# Patient Record
Sex: Male | Born: 1940 | Race: White | Hispanic: No | Marital: Married | Smoking: Former smoker
Health system: Southern US, Community
[De-identification: ages and names within clinical notes are randomized; demographics above are authoritative.]

## PROBLEM LIST (undated history)

## (undated) DIAGNOSIS — N200 Calculus of kidney: Secondary | ICD-10-CM

## (undated) DIAGNOSIS — J449 Chronic obstructive pulmonary disease, unspecified: Secondary | ICD-10-CM

## (undated) DIAGNOSIS — C801 Malignant (primary) neoplasm, unspecified: Secondary | ICD-10-CM

## (undated) DIAGNOSIS — C3490 Malignant neoplasm of unspecified part of unspecified bronchus or lung: Secondary | ICD-10-CM

## (undated) DIAGNOSIS — I10 Essential (primary) hypertension: Secondary | ICD-10-CM

## (undated) DIAGNOSIS — I2510 Atherosclerotic heart disease of native coronary artery without angina pectoris: Secondary | ICD-10-CM

## (undated) DIAGNOSIS — E041 Nontoxic single thyroid nodule: Secondary | ICD-10-CM

## (undated) DIAGNOSIS — C439 Malignant melanoma of skin, unspecified: Secondary | ICD-10-CM

## (undated) HISTORY — DX: Calculus of kidney: N20.0

## (undated) HISTORY — PX: CORONARY ANGIOPLASTY: SHX604

## (undated) HISTORY — DX: Nontoxic single thyroid nodule: E04.1

## (undated) HISTORY — PX: OTHER SURGICAL HISTORY: SHX169

## (undated) HISTORY — DX: Malignant neoplasm of unspecified part of unspecified bronchus or lung: C34.90

## (undated) HISTORY — DX: Malignant (primary) neoplasm, unspecified: C80.1

## (undated) HISTORY — DX: Chronic obstructive pulmonary disease, unspecified: J44.9

## (undated) HISTORY — DX: Malignant melanoma of skin, unspecified: C43.9

## (undated) SURGERY — Surgical Case
Anesthesia: *Unknown

---

## 2009-11-19 HISTORY — PX: OTHER SURGICAL HISTORY: SHX169

## 2010-08-24 ENCOUNTER — Ambulatory Visit: Payer: Self-pay | Admitting: Cardiology

## 2011-07-03 ENCOUNTER — Ambulatory Visit: Payer: Self-pay | Admitting: Unknown Physician Specialty

## 2011-07-03 DIAGNOSIS — I2510 Atherosclerotic heart disease of native coronary artery without angina pectoris: Secondary | ICD-10-CM

## 2011-07-09 ENCOUNTER — Ambulatory Visit: Payer: Self-pay | Admitting: Unknown Physician Specialty

## 2011-07-11 LAB — PATHOLOGY REPORT

## 2012-04-01 ENCOUNTER — Ambulatory Visit: Payer: Self-pay | Admitting: General Surgery

## 2012-04-01 HISTORY — PX: COLONOSCOPY: SHX174

## 2015-04-25 DIAGNOSIS — R0681 Apnea, not elsewhere classified: Secondary | ICD-10-CM | POA: Insufficient documentation

## 2015-08-02 ENCOUNTER — Encounter: Payer: Self-pay | Admitting: Urgent Care

## 2015-08-02 DIAGNOSIS — Y998 Other external cause status: Secondary | ICD-10-CM | POA: Insufficient documentation

## 2015-08-02 DIAGNOSIS — S3992XA Unspecified injury of lower back, initial encounter: Secondary | ICD-10-CM | POA: Diagnosis present

## 2015-08-02 DIAGNOSIS — I10 Essential (primary) hypertension: Secondary | ICD-10-CM | POA: Diagnosis not present

## 2015-08-02 DIAGNOSIS — X58XXXA Exposure to other specified factors, initial encounter: Secondary | ICD-10-CM | POA: Diagnosis not present

## 2015-08-02 DIAGNOSIS — Y9389 Activity, other specified: Secondary | ICD-10-CM | POA: Diagnosis not present

## 2015-08-02 DIAGNOSIS — Y9289 Other specified places as the place of occurrence of the external cause: Secondary | ICD-10-CM | POA: Diagnosis not present

## 2015-08-02 NOTE — ED Notes (Signed)
Patient presents with c.o LEFT lower back/flank pain that started after "twisting" wrong this am. Patient also picked up 40lb box. Denies urinary symptoms.

## 2015-08-03 ENCOUNTER — Emergency Department
Admission: EM | Admit: 2015-08-03 | Discharge: 2015-08-03 | Discharge status: Against Medical Advice | Payer: PPO | Attending: Emergency Medicine | Admitting: Emergency Medicine

## 2015-08-03 HISTORY — DX: Atherosclerotic heart disease of native coronary artery without angina pectoris: I25.10

## 2015-08-03 HISTORY — DX: Essential (primary) hypertension: I10

## 2015-12-26 DIAGNOSIS — L820 Inflamed seborrheic keratosis: Secondary | ICD-10-CM | POA: Diagnosis not present

## 2015-12-26 DIAGNOSIS — Z08 Encounter for follow-up examination after completed treatment for malignant neoplasm: Secondary | ICD-10-CM | POA: Diagnosis not present

## 2015-12-26 DIAGNOSIS — L918 Other hypertrophic disorders of the skin: Secondary | ICD-10-CM | POA: Diagnosis not present

## 2015-12-26 DIAGNOSIS — Z85828 Personal history of other malignant neoplasm of skin: Secondary | ICD-10-CM | POA: Diagnosis not present

## 2015-12-26 DIAGNOSIS — L570 Actinic keratosis: Secondary | ICD-10-CM | POA: Diagnosis not present

## 2015-12-26 DIAGNOSIS — L821 Other seborrheic keratosis: Secondary | ICD-10-CM | POA: Diagnosis not present

## 2015-12-26 DIAGNOSIS — D485 Neoplasm of uncertain behavior of skin: Secondary | ICD-10-CM | POA: Diagnosis not present

## 2015-12-26 DIAGNOSIS — C434 Malignant melanoma of scalp and neck: Secondary | ICD-10-CM | POA: Diagnosis not present

## 2015-12-26 DIAGNOSIS — Z1283 Encounter for screening for malignant neoplasm of skin: Secondary | ICD-10-CM | POA: Diagnosis not present

## 2015-12-26 DIAGNOSIS — Z85820 Personal history of malignant melanoma of skin: Secondary | ICD-10-CM | POA: Diagnosis not present

## 2016-01-04 DIAGNOSIS — C434 Malignant melanoma of scalp and neck: Secondary | ICD-10-CM | POA: Diagnosis not present

## 2016-01-11 ENCOUNTER — Ambulatory Visit
Admission: RE | Admit: 2016-01-11 | Discharge: 2016-01-11 | Disposition: A | Discharge status: Home / Self Care | Payer: PPO | Source: Ambulatory Visit | Attending: General Surgery | Admitting: General Surgery

## 2016-01-11 ENCOUNTER — Telehealth: Payer: Self-pay | Admitting: *Deleted

## 2016-01-11 ENCOUNTER — Other Ambulatory Visit: Payer: Self-pay | Admitting: *Deleted

## 2016-01-11 ENCOUNTER — Ambulatory Visit (INDEPENDENT_AMBULATORY_CARE_PROVIDER_SITE_OTHER): Payer: PPO | Admitting: General Surgery

## 2016-01-11 ENCOUNTER — Encounter: Payer: Self-pay | Admitting: General Surgery

## 2016-01-11 VITALS — BP 154/84 | HR 76 | Resp 16 | Ht 74.0 in | Wt 218.0 lb

## 2016-01-11 DIAGNOSIS — J9811 Atelectasis: Secondary | ICD-10-CM | POA: Insufficient documentation

## 2016-01-11 DIAGNOSIS — C439 Malignant melanoma of skin, unspecified: Secondary | ICD-10-CM

## 2016-01-11 DIAGNOSIS — J984 Other disorders of lung: Secondary | ICD-10-CM | POA: Insufficient documentation

## 2016-01-11 DIAGNOSIS — D0361 Melanoma in situ of right upper limb, including shoulder: Secondary | ICD-10-CM | POA: Diagnosis not present

## 2016-01-11 NOTE — Telephone Encounter (Signed)
-----   Message from Robert Bellow, MD sent at 01/11/2016  1:02 PM EST ----- CXR shows a spot likely from his nipple, but needs to be repeated with nipple markers. Thanks.  ----- Message -----    From: Rad Results In Interface    Sent: 01/11/2016  11:35 AM      To: Robert Bellow, MD

## 2016-01-11 NOTE — Progress Notes (Signed)
Patient ID: Glen Davis, male   DOB: 09-14-1941, 75 y.o.   MRN: [id number]  Chief Complaint  Patient presents with  . Other    Melanoma on upper back    HPI Glen Davis is a 75 y.o. male here today for a evaluation of a recently diagnosed melanoma on his right upper back . Patient states he noted several areas on the back and recent dermatology evaluation confirmed a melanoma on the right trapezius area. Several other nonmalignant areas consistent with seborrheic keratoses were treated with a quick nitrogen. No pain. He states he had a biopsy done about a month ago. He is accompanied by his wife, Glen Davis.  HPI  Past Medical History  Diagnosis Date  . Hypertension   . CAD (coronary artery disease)   . 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 Cardiac stents  2011    Angioplasty / Stenting Femoral  . Arm surgery Left     arm  . Colonoscopy  04/01/12    Family History  Problem Relation Age of Onset  . Hodgkin's lymphoma Daughter     Social History Social History  Substance Use Topics  . Smoking status: Never Smoker   . Smokeless tobacco: None  . Alcohol Use: 0.0 oz/week    0 Standard drinks or equivalent per week    Allergies  Allergen Reactions  . Lisinopril Swelling  . Other Itching    Nitroglycerin Patch.    Current Outpatient Prescriptions  Medication Sig Dispense Refill  . amLODipine (NORVASC) 5 MG tablet TAKE 1 TABLET (5 MG TOTAL) BY MOUTH ONCE DAILY.  1  . aspirin 81 MG tablet Take 81 mg by mouth daily.    Marland Kitchen atorvastatin (LIPITOR) 40 MG tablet TAKE 1 TABLET (40 MG TOTAL) BY MOUTH ONCE DAILY.  11  . clopidogrel (PLAVIX) 75 MG tablet     . hydrochlorothiazide (HYDRODIURIL) 12.5 MG tablet     . losartan (COZAAR) 50 MG tablet     . metoprolol succinate (TOPROL-XL) 50 MG 24 hr tablet     . tamsulosin (FLOMAX) 0.4 MG CAPS capsule      No current facility-administered medications for this visit.    Review of  Systems Review of Systems  Constitutional: Negative.   Respiratory: Negative.   Cardiovascular: Negative.     Blood pressure 154/84, pulse 76, resp. rate 16, height '6\' 2"'$  (1.88 m), weight 218 lb (98.884 kg).  Physical Exam Physical Exam  Constitutional: He is oriented to person, place, and time. He appears well-developed and well-nourished.  Eyes: Conjunctivae are normal.  Neck: Neck supple.    Cardiovascular: Normal rate, regular rhythm and normal heart sounds.   Pulmonary/Chest: Effort normal and breath sounds normal.  Lymphadenopathy:       Head (right side): No posterior auricular and no occipital adenopathy present.       Head (left side): No posterior auricular and no occipital adenopathy present.    He has no cervical adenopathy.    He has no axillary adenopathy.  Neurological: He is alert and oriented to person, place, and time.  Skin: Skin is warm and dry.  multiple areas of treated  on back. Right posterior shoulder 6 by 12 mm scar.    Data Reviewed Pathology reported an invasive malignant melanoma measuring at least 0.42 mm, positive deep margin and peripheral margin. PT 1 a. The area contained both malignant melanoma, basal cell carcinoma  and seborrheic keratosis. Ulceration was reported above the area of basal cell carcinoma but not the area involving melanoma.  PA and lateral chest x-ray suggested a possible left lower lobe nodule likely related to the nipple. Repeat films with nipple markers recommended and pending.  Liver function studies dated 07/04/2015 were normal. Potassium was 3.5.  Hemoglobin 12/29/2014: 16.6.  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    Plan    At present, the melanoma is stented in the usual 0.7 mm cutoff to recommend sentinel node biopsy, but as the deep margin is positive it seems reasonable to complete a sentinel node study at the time of formal wide excision. The risks associated with  node biopsy and the potential for wound healing issues on the back were discussed.  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    Patient to stop his Plavix for five days before surgery. It is okay for patient to continue 81 mg aspirin once daily.   Patient's surgery has been scheduled for 01-24-16 at [facility].  Schedule Melanoma on back.   This information has been scribed by Gaspar Cola CMA. PCP:  Tera Partridge 01/12/2016, 10:15 AM

## 2016-01-11 NOTE — Telephone Encounter (Signed)
Patient contacted and notified that he needs to have a repeat CXR.   Also, patient aware of arrival time the day of surgery.   He will contact the office if he has further questions.

## 2016-01-11 NOTE — Patient Instructions (Addendum)
Patient to stop his Plavix for five days before surgery. It is okay for patient to continue 81 mg aspirin once daily.   Patient's surgery has been scheduled for 01-24-16 at [facility].

## 2016-01-11 NOTE — Telephone Encounter (Signed)
Message for patient to call the office.

## 2016-01-11 NOTE — Telephone Encounter (Signed)
Patient called and doubled check his medications he was taking at home, he stated there was no change and what we have on file is current.

## 2016-01-12 ENCOUNTER — Ambulatory Visit
Admission: RE | Admit: 2016-01-12 | Discharge: 2016-01-12 | Disposition: A | Discharge status: Home / Self Care | Payer: PPO | Source: Ambulatory Visit | Attending: General Surgery | Admitting: General Surgery

## 2016-01-12 DIAGNOSIS — R918 Other nonspecific abnormal finding of lung field: Secondary | ICD-10-CM | POA: Diagnosis not present

## 2016-01-12 DIAGNOSIS — C439 Malignant melanoma of skin, unspecified: Secondary | ICD-10-CM | POA: Insufficient documentation

## 2016-01-12 DIAGNOSIS — D0361 Melanoma in situ of right upper limb, including shoulder: Secondary | ICD-10-CM | POA: Diagnosis not present

## 2016-01-12 NOTE — H&P (Signed)
HPI  Glen Davis is a 75 y.o. male here today for a evaluation of a recently diagnosed melanoma on his right upper back . Patient states he noted several areas on the back and recent dermatology evaluation confirmed a melanoma on the right trapezius area. Several other nonmalignant areas consistent with seborrheic keratoses were treated with a quick nitrogen. No pain. He states he had a biopsy done about a month ago. He is accompanied by his wife, [name].  HPI  Past Medical History   Diagnosis  Date   .  Hypertension    .  CAD (coronary artery disease)    .  Cancer [facility])      left arm    Past Surgical History   Procedure  Laterality  Date   .  Cardiac stents   2011     Angioplasty / Stenting Femoral   .  Arm surgery  Left      arm   .  Colonoscopy   04/01/12    Family History   Problem  Relation  Age of Onset   .  Hodgkin's lymphoma  Daughter     Social History  Social History   Substance Use Topics   .  Smoking status:  Never Smoker   .  Smokeless tobacco:  None   .  Alcohol Use:  0.0 oz/week     0 Standard drinks or equivalent per week    Allergies   Allergen  Reactions   .  Lisinopril  Swelling   .  Other  Itching     Nitroglycerin Patch.    Current Outpatient Prescriptions   Medication  Sig  Dispense  Refill   .  amLODipine (NORVASC) 5 MG tablet  TAKE 1 TABLET (5 MG TOTAL) BY MOUTH ONCE DAILY.   1   .  aspirin 81 MG tablet  Take 81 mg by mouth daily.     Marland Kitchen  atorvastatin (LIPITOR) 40 MG tablet  TAKE 1 TABLET (40 MG TOTAL) BY MOUTH ONCE DAILY.   11   .  clopidogrel (PLAVIX) 75 MG tablet      .  hydrochlorothiazide (HYDRODIURIL) 12.5 MG tablet      .  losartan (COZAAR) 50 MG tablet      .  metoprolol succinate (TOPROL-XL) 50 MG 24 hr tablet      .  tamsulosin (FLOMAX) 0.4 MG CAPS capsule       No current facility-administered medications for this visit.    Review of Systems  Review of Systems  Constitutional: Negative.  Respiratory: Negative.  Cardiovascular:  Negative.   Blood pressure 154/84, pulse 76, resp. rate 16, height '6\' 2"'$  (1.88 m), weight 218 lb (98.884 kg).  Physical Exam  Physical Exam  Constitutional: He is oriented to person, place, and time. He appears well-developed and well-nourished.  Eyes: Conjunctivae are normal.  Neck: Neck supple.    Cardiovascular: Normal rate, regular rhythm and normal heart sounds.  Pulmonary/Chest: Effort normal and breath sounds normal.  Lymphadenopathy:  Head (right side): No posterior auricular and no occipital adenopathy present.  Head (left side): No posterior auricular and no occipital adenopathy present.  He has no cervical adenopathy.  He has no axillary adenopathy.  Neurological: He is alert and oriented to person, place, and time.  Skin: Skin is warm and dry.  multiple areas of treated on back. Right posterior shoulder 6 by 12 mm scar.   Data Reviewed  Pathology reported an invasive malignant melanoma measuring  at least 0.42 mm, positive deep margin and peripheral margin. PT 1 a. The area contained both malignant melanoma, basal cell carcinoma and seborrheic keratosis. Ulceration was reported above the area of basal cell carcinoma but not the area involving melanoma.  PA and lateral chest x-ray suggested a possible left lower lobe nodule likely related to the nipple. Repeat films with nipple markers recommended and pending.  Liver function studies dated 07/04/2015 were normal. Potassium was 3.5.  Hemoglobin 12/29/2014: 16.6.  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   Plan   At present, the melanoma is stented in the usual 0.7 mm cutoff to recommend sentinel node biopsy, but as the deep margin is positive it seems reasonable to complete a sentinel node study at the time of formal wide excision.  The risks associated with node biopsy and the potential for wound healing issues on the back were discussed.  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   Patient to stop his Plavix for five days before surgery. It is okay for patient to continue 81 mg aspirin once daily.  Patient's surgery has been scheduled for 01-24-16 at [facility].  Schedule Melanoma on back.  This information has been scribed by Gaspar Cola CMA.  PCP: Tera Partridge  01/12/2016, 10:15 AM

## 2016-01-16 ENCOUNTER — Other Ambulatory Visit: Payer: Self-pay | Admitting: *Deleted

## 2016-01-16 ENCOUNTER — Telehealth: Payer: Self-pay | Admitting: *Deleted

## 2016-01-16 DIAGNOSIS — D0361 Melanoma in situ of right upper limb, including shoulder: Secondary | ICD-10-CM

## 2016-01-16 DIAGNOSIS — R9389 Abnormal findings on diagnostic imaging of other specified body structures: Secondary | ICD-10-CM

## 2016-01-16 NOTE — Telephone Encounter (Signed)
-----   Message from Robert Bellow, MD sent at 01/16/2016  1:07 PM EST ----- Please notify the patient that the small abnormality previously noted on his chest x-ray is not related to the nipple, and I would recommend that we get a CT scan of the chest without contrast. Please arrange if he is amenable. Thank you ----- Message -----    From: Rad Results In Interface    Sent: 01/12/2016   4:47 PM      To: Robert Bellow, MD

## 2016-01-16 NOTE — Telephone Encounter (Signed)
Patient is agreeable to CT chest.   This patient has been scheduled for a CT chest without contrast at [facility] for Monday, 01-23-16 at 4 pm (arrive 3:45 pm). Prep: none. Patient verbalizes understanding.

## 2016-01-18 ENCOUNTER — Encounter: Payer: Self-pay | Admitting: *Deleted

## 2016-01-18 ENCOUNTER — Other Ambulatory Visit: Payer: PPO

## 2016-01-18 NOTE — Pre-Procedure Instructions (Signed)
encounter Isaias Cowman, MD - 10/24/2015 4:00 PM EST Formatting of this note may be different from the original. Established Patient Visit   Chief Complaint: Chief Complaint  Patient presents with  . Follow-up  4 months-having trouble sleeping  Date of Service: 10/24/2015 Date of Birth: 09/27/1941 PCP: Dion Body, MD  History of Present Illness: Mr. Sentell is a 75 y.o.male patient who returns for  1. 80% stenosis ostium D1 by cardiac catheterization 08/24/2010 2. Essential hypertension 3. Hyperlipidemia  Patient reports doing well, denies chest pain shortness of breath. He has not experienced any palpitations or heart racing. He denies peripheral edema. The patient is active, plays golf. He exercises regularly, goes to gym 3 times a week. Echocardiogram 06/13/2015 revealed LV EF of 55%. ETT sestamibi study 06/13/2015 revealed LVEF 55%, with mild reversible inferior wall defect consistent with mild ischemia. Cardiac catheterization was deferred in the absence of chest pain.  The patient has essential hypertension, blood pressure well controlled on amlodipine, hydrochlorothiazide, losartan, and metoprolol succinate, which are well tolerated without apparent side effects. The patient follows a low-sodium, no added salt diet.  The patient has hyperlipidemia, LDL cholesterol is 25, triglycerides 147 on 07/04/2015, currently on atorvastatin, which is well tolerated without apparent side effects, followed by his primary care provider. The patient follows a low-cholesterol, low-fat diet.  Past Medical and Surgical History  Past Medical History Past Medical History  Diagnosis Date  . BPH (benign prostatic hypertrophy)  . Coronary artery disease 2011  stent to mid LAD and siagonal  . Dyspnea 04/25/2015  Echo August, 2015: EF greater than 55% with mild LVH, mildly dilated ascending aorta without evidence of aortic regurgitation. Normal right-sided pressures with a estimated RV systolic  pressure of 22, left atrial size 4.1 centimeters, mild mitral regurgitation  . History of echocardiogram  07/2010: [facility]) EF normal, mod LVH, bild biatral enlargement, mild MR, mild TR 2.5 m/sec  . History of myocardial perfusion scan  08/08/10: Stress nuclear: [facility]) inferior ischemia. 9.7 METS, had chest pain with exertion  . Hypertension  . Hypertriglyceridemia  Trig 242- 08/2013   Past Surgical History He has a past surgical history that includes ORIF left ankle (1980); Cardiac catheterization; Angioplasty / stenting femoral; and Colonoscopy (04/01/12).   Medications and Allergies  Current Medications  Current Outpatient Prescriptions  Medication Sig Dispense Refill  . amLODIPine (NORVASC) 5 MG tablet TAKE 1 TABLET BY MOUTH ONCE A DAY 90 tablet 1  . aspirin 81 MG EC tablet Take 81 mg by mouth daily.  Marland Kitchen atorvastatin (LIPITOR) 40 MG tablet Take 1 tablet (40 mg total) by mouth once daily. 30 tablet 11  . clopidogrel (PLAVIX) 75 mg tablet TAKE 1 TABLET (75 MG TOTAL) BY MOUTH ONCE DAILY. 90 tablet 1  . hydrochlorothiazide (HYDRODIURIL) 12.5 MG tablet TAKE 2 TABLETS BY MOUTH ONCE A DAY 180 tablet 1  . losartan (COZAAR) 50 MG tablet TAKE 1 TABLET BY MOUTH ONCE A DAY 90 tablet 1  . metoprolol succinate (TOPROL-XL) 50 MG XL tablet TAKE 1 TABLET (50 MG TOTAL) BY MOUTH ONCE DAILY. 90 tablet 1  . nitroGLYcerin (NITROSTAT) 0.4 MG SL tablet Place 1 tablet (0.4 mg total) under the tongue every 5 (five) minutes as needed. May take up to 3 doses. 25 tablet 2  . silver sulfADIAZINE (SILVADENE, SSD) 1 % cream Apply topically 2 (two) times daily. 50 g 0  . tamsulosin (FLOMAX) 0.4 mg capsule TAKE 1 CAPSULE BY MOUTH ONCE A DAY 30 capsule  5   No current facility-administered medications for this visit.   Allergies: Lisinopril and Other  Social and Family History  Social History reports that he quit smoking about 46 years ago. His smoking use included Cigarettes. He has a 13.00  pack-year smoking history. He has never used smokeless tobacco. He reports that he drinks alcohol. He reports that he does not use illicit drugs.  Family History Family History  Problem Relation Age of Onset  . Stroke Father 65  Deceased  . No Known Problems Mother  . No Known Problems Sister  . No Known Problems Brother  . No Known Problems Brother  . No Known Problems Brother  . No Known Problems Brother  . No Known Problems Sister   Review of Systems   Review of Systems: The patient denies chest pain, shortness of breath, orthopnea, paroxysmal nocturnal dyspnea, pedal edema, palpitations, heart racing, presyncope, syncope. Review of 12 Systems is negative except as described above.  Physical Examination   Vitals: Visit Vitals  . BP (P) 122/70  . Pulse (P) 70  . Ht (P) 184.2 cm (6' 0.5")  . Wt (!) (P) 100.9 kg (222 lb 6.4 oz)  . BMI (P) 29.75 kg/m2   Ht:(P) 184.2 cm (6' 0.5") Wt:(!) (P) 100.9 kg (222 lb 6.4 oz) QPY:PPJK surface area is 2.27 meters squared (pended). Body mass index is 29.75 kg/(m^2) (pended).  HEENT: Pupils equally reactive to light and accomodation  Neck: Supple without thyromegaly, carotid pulses 2+ Lungs: clear to auscultation bilaterally; no wheezes, rales, rhonchi Heart: Regular rate and rhythm. No gallops, murmurs or rub Abdomen: soft nontender, nondistended, with normal bowel sounds Extremities: no cyanosis, clubbing, or edema Peripheral Pulses: 2+ in all extremities, 2+ femoral pulses bilaterally  Assessment   75 y.o. male with  1. Coronary artery disease involving native coronary artery of native heart without angina pectoris  2. Essential hypertension  3. DOE (dyspnea on exertion)  4. Hypertriglyceridemia (LDL 147 - 07/04/15)   75 year old gentleman with known coronary disease, currently without chest pain, with mild disc inferior wall ischemia seen on ETT sestamibi study, not consistent with his known coronary anatomy, cardiac  catheterization deferred in the absence of chest pain. The patient has essential hypertension, blood pressure under excellent control on current BP medications.  Plan   1. Continue current medications 2. Counseled patient about low-sodium diet 3. DASH diet printed instructions given to the patient 4. Counseled patient about low-cholesterol diet 5. Continue atorvastatin for hyperlipidemia management  6. Low-fat and cholesterol diet printed instructions given to the patient 7. Return to clinic for follow-up in 6 months  No orders of the defined types were placed in this encounter.  Return in about 6 months (around 04/23/2016).  Isaias Cowman, MD    Plan of Treatment - as of this encounter Upcoming Encounters Upcoming Encounters  Date Type Specialty Care Team Description  01/30/2016 Appointment Internal Medicine Dion Body, MD  [place]  [facility], [place] [address]  [phone]  [phone] (Fax)    04/24/2016 Appointment Cardiology Isaias Cowman, MD  [place]  [facility]  [place], [place] [address]  [phone]  [phone] (Fax)    Visit Diagnoses - in this encounter Diagnosis  Coronary artery disease involving native coronary artery of native heart without angina pectoris - Primary  Essential hypertension  DOE (dyspnea on exertion)  Other dyspnea and respiratory abnormality   Hypertriglyceridemia (LDL 147 - 07/04/15)  Pure hyperglyceridemia  Document Information Primary Care Provider Dion Body (Jun. 05, 2012 - Present) [phone] (Work) [phone] (Fax)  [facility] [place], [place] [address] Document Coverage Dates Dec. 05, 2016 - Dec. 06, 2016 [place] [phone] (Work) [place], [place] [address] Encounter Providers Isaias Cowman (Attending) [phone] (Work) [phone] (Fax)   [place] [facility] [place], [place] [address]

## 2016-01-18 NOTE — Pre-Procedure Instructions (Signed)
   1. Negative ETT 2. Normal left ventricular function 3. Normal wall mode 4. Mild inferior wall ischemia   Result Narrative  Procedure: Exercise Myocardial Perfusion Imaging   ONE day procedure  Indication: Coronary artery disease involving native coronary artery of  native heart without angina pectoris - Plan: NM myocardial perfusion SPECT  multiple (stress and rest), ECG stress test only  DOE (dyspnea on exertion) - Plan: NM myocardial perfusion SPECT multiple  (stress and rest), ECG stress test only Ordering Physician:   Dr. Isaias Cowman   Clinical History: 75 y.o. year old male Vitals: Height: 71 in  Weight: 217 lb Cardiac risk factors include:    Hyperlipidemia, PCI, HTN and CAD    Procedure: The patient performed treadmill exercise using a Bruce protocol for 8:01  minutes. The exercise test was stopped due to SOB/fatigue.  Blood pressure  response was hypertensive. The patient developed symptoms other than  fatigue during the procedure; specific symptoms included WHEEZING. PULSE  OXIMETRY OF 92% AT PEAK EXERCISE.  Rest HR: 53bpm Rest BP: 130/32mHg Max HR: 130bpm Max BP: 188/1025mg Mets:     10.10 % MAX HR:   89%  Stress Test Administered by: DIOswald HillockCMA  ECG Interpretation: Rest ECG:  normal sinus rhythm, none Stress ECG:  normal sinus rhythm,  Recovery ECG:  normal sinus rhythm ECG Interpretation:  negative, no ECG changes.   Administrations This Visit    technetium [id number] (CARDIOLITE) injection 12.28.63llicurie    Admin Date Action Dose Route Administered By      06/13/2015 Given by Other 12.81.77llicurie Intravenous KelKingsley CallanderCNMT            technetium [id number] (CARDIOLITE) injection 33.311.65licurie    Admin Date Action Dose Route Administered By      06/13/2015 Given by Other 33.379.03licurie Intravenous KellKingsley CallanderNMT              Gated post-stress perfusion imaging was performed  30 minutes after stress.  Rest images were performed 30 minutes after injection.  Gated LV Analysis:  TID:    LVEF= 55%  FINDINGS: Regional wall motion:  reveals normal myocardial thickening and wall  motion. The overall quality of the study is good.   Artifacts noted: no Left ventricular cavity: normal.  Perfusion Analysis:  SPECT images demonstrate small perfusion abnormality  of mild intensity is present in the inferior region on the stress images.     Status Results Details   Encounter Summary

## 2016-01-18 NOTE — Pre-Procedure Instructions (Signed)
Value Range  LV Ejection Fraction (%) 55   Aortic Valve Stenosis Grade none   Aortic Valve Regurgitation Grade mild   Aortic Valve Max Velocity (m/s) 1.6 m/sec   Mitral Valve Stenosis Grade none   Mitral Valve Regurgitation Grade mild   Tricuspid Valve Regurgitation Grade mild   Tricuspid Valve Regurgitation Max Velocity (m/s) 2.7 m/sec   Right Ventricle Systolic Pressure (mmHg) 03.0 mmHg   LV End Diastolic Diameter (cm) 5.5 cm  LV End Systolic Diameter (cm) 3.6 cm  LV Septum Wall Thickness (cm) 1.3 cm  LV Posterior Wall Thickness (cm) 1.2 cm  Left Atrium Diameter (cm) 4.3 cm   Result Narrative                     CARDIOLOGY DEPARTMENT                         [place], [place] #: [id number]         [address] [place], [place] [address]             Date: 06/13/2015 08:15 AM                                                                  Adult Male Age: 60 yrs                    ECHOCARDIOGRAM REPORT                         Outpatient           STUDY:CHEST WALL                       TAPE:           [id number]::[id number]            ECHO:Yes   DOPPLER:Yes                FILE:           MD1:           COLOR:Yes  CONTRAST:Yes     MACHINE:Philips       RV BIOPSY:No         3D:No   SOUND QLTY:Moderate          MEDIUM:None ___________________________________________________________________________________________           HISTORY:Chest pain           REASON:Assess, LV function       INDICATION:R06.09 (ICD-10-CM) - DOE (dyspnea on exertion) ___________________________________________________________________________________________  ECHOCARDIOGRAPHIC MEASUREMENTS 2D DIMENSIONS AORTA  Values      Normal Range      MAIN PA          Values      Normal Range           Annulus:  2.0 cm    [2.3 - 2.9]                PA Main:  nm*       [1.5 - 2.1]    Aorta Sin:  nm*       [3.1 - 3.7]       RIGHT VENTRICLE       ST Junction:  nm*       [2.6 - 3.2]                RV Base:  nm*       [ < 4.2]         Asc.Aorta:  nm*       [2.6 - 3.4]                 RV Mid:  nm*       [ < 3.5]  LEFT VENTRICLE                                        RV Length:  nm*       [ < 8.6]             LVIDd:  5.5 cm    [4.2 - 5.9]       INFERIOR VENA CAVA             LVIDs:  3.6 cm                              Max. IVC:  nm*       [ <= 2.1]                FS:  34.9 %    [> 25]                    Min. IVC:  nm*               SWT:  1.3 cm    [0.6 - 1.0]                   ------------------               PWT:  1.2 cm    [0.6 - 1.0]                   nm* - not measured  LEFT ATRIUM           LA Diam:  4.3 cm    [3.0 - 4.0]       LA A4C Area:  nm*       [ < 20]         LA Volume:  nm*       [18 - 58] ___________________________________________________________________________________________  ECHOCARDIOGRAPHIC DESCRIPTIONS  AORTIC ROOT         Size:Normal   Dissection:INDETERM FOR DISSECTION  AORTIC VALVE     Leaflets:Tricuspid         Morphology:MILDLY THICKENED     Mobility:Fully mobile  LEFT VENTRICLE  Size:Normal              Anterior:Normal  Contraction:Normal               Lateral:Normal   Closest EF:>55% (Estimated)      Septal:Normal    LV Masses:No Masses             Apical:Normal          ZMO:QHUT LVH            Inferior:Normal                                 Posterior:Normal Dias.FxClass:N/A  MITRAL VALVE     Leaflets:Normal              Mobility:Fully mobile   Morphology:Normal  LEFT ATRIUM         Size:MILDLY ENLARGED    LA Masses:No masses  MAIN PA         Size:Normal  PULMONIC VALVE     Leaflets:N/A               Morphology:Normal     Mobility:Fully mobile  RIGHT VENTRICLE    RV Masses:No Masses               Size:Normal    Free Wall:Normal           Contraction:Normal  TRICUSPID VALVE     Leaflets:Normal               Mobility:Fully mobile   Morphology:Normal  RIGHT ATRIUM         Size:Normal              RA Other:None      RA Mass:No masses  PERICARDIUM        Fluid:No effusion  INFERIOR VENACAVA         Size:Normal Normal respiratory collapse  ____________________________________________________________________  DOPPLER ECHO and OTHER SPECIAL PROCEDURES    Aortic:MILD AR                       No AS           162.0 cm/sec peak vel         10.5 mmHg peak grad     Mitral:MILD MR                       No MS                                         2.8 cm^2 by DOPPLER           MV Inflow E Vel=79.6 cm/sec   MV Annulus E'Vel=6.3 cm/sec           E/E'Ratio=12.6  Tricuspid:MILD TR                       No TS           268.5 cm/sec peak TR vel      38.8 mmHg peak RV pressure  Pulmonary:MILD PR                       No PS           73.5 cm/sec peak vel  2.2 mmHg peak grad    ___________________________________________________________________________________________ INTERPRETATION NORMAL LEFT VENTRICULAR SYSTOLIC FUNCTION   WITH MILD LVH MILD VALVULAR REGURGITATION (See above) NO VALVULAR STENOSIS MILD PHTN   ___________________________________________________________________________________________ Electronically signed by: MD Miquel Dunn on 06/15/2015 04:42 PM             Performed By: Maurilio Lovely, [place]       Ordering Physician: Isaias Cowman  ___________________________________________________________________________________________   Status Results Details   Encounter Summary  2015 September

## 2016-01-18 NOTE — Patient Instructions (Addendum)
  Your procedure is scheduled on: 01-24-16 (TUESDAY) Report to [place] @ 9:45 (2ND DESK ON RIGHT)   Remember: Instructions that are not followed completely may result in serious medical risk, up to and including death, or upon the discretion of your surgeon and anesthesiologist your surgery may need to be rescheduled.    _X___ 1. Do not eat food or drink liquids after midnight. No gum chewing or hard candies.     _X___ 2. No Alcohol for 24 hours before or after surgery.   ____ 3. Bring all medications with you on the day of surgery if instructed.    _X___ 4. Notify your doctor if there is any change in your medical condition     (cold, fever, infections).     Do not wear jewelry, make-up, hairpins, clips or nail polish.  Do not wear lotions, powders, or perfumes. You may wear deodorant.  Do not shave 48 hours prior to surgery. Men may shave face and neck.  Do not bring valuables to the hospital.    [facility] is not responsible for any belongings or valuables.               Contacts, dentures or bridgework may not be worn into surgery.  Leave your suitcase in the car. After surgery it may be brought to your room.  For patients admitted to the hospital, discharge time is determined by your treatment team.   Patients discharged the day of surgery will not be allowed to drive home.   Please read over the following fact sheets that you were given:    _X___ Take these medicines the morning of surgery with A SIP OF WATER:    1. AMLODIPINE  2. ATORVASTATIN  3. LOSARTAN   4. METOPROLOL  5. FLOMAX  6.  ____ Fleet Enema (as directed)   _X___ Use CHG Soap as directed  ____ Use inhalers on the day of surgery  ____ Stop metformin 2 days prior to surgery    ____ Take 1/2 of usual insulin dose the night before surgery and none on the morning of surgery.   _X___ Stop Coumadin/Plavix/aspirin-PT STOPPED PLAVIX 01-17-16-H&P INSTRUCTED PT TO STOP 5 DAYS PRIOR TO  SURGERY-OK TO CONTINUE 81 MG ASPIRIN-DO NOT TAKE DAY OF SURGERY  ____ Stop Anti-inflammatories   ____ Stop supplements until after surgery.    ____ Bring C-Pap to the hospital.

## 2016-01-19 ENCOUNTER — Encounter
Admission: RE | Admit: 2016-01-19 | Discharge: 2016-01-19 | Disposition: A | Discharge status: Home / Self Care | Payer: PPO | Source: Ambulatory Visit | Attending: General Surgery | Admitting: General Surgery

## 2016-01-19 DIAGNOSIS — Z01812 Encounter for preprocedural laboratory examination: Secondary | ICD-10-CM | POA: Insufficient documentation

## 2016-01-19 LAB — BASIC METABOLIC PANEL
Anion gap: 9 (ref 5–15)
BUN: 13 mg/dL (ref 6–20)
CO2: 27 mmol/L (ref 22–32)
CREATININE: 0.86 mg/dL (ref 0.61–1.24)
Calcium: 9.4 mg/dL (ref 8.9–10.3)
Chloride: 102 mmol/L (ref 101–111)
Glucose, Bld: 105 mg/dL — ABNORMAL HIGH (ref 65–99)
POTASSIUM: 3.2 mmol/L — AB (ref 3.5–5.1)
SODIUM: 138 mmol/L (ref 135–145)

## 2016-01-19 MED ORDER — HEPARIN SOD (PORK) LOCK FLUSH 100 UNIT/ML IV SOLN
INTRAVENOUS | Status: AC
  Filled 2016-01-19: qty 5

## 2016-01-20 ENCOUNTER — Telehealth: Payer: Self-pay | Admitting: General Surgery

## 2016-01-20 ENCOUNTER — Ambulatory Visit: Payer: PPO

## 2016-01-20 NOTE — Telephone Encounter (Signed)
The patient was notified that his potassium is slightly low at 3.2, likely secondary to diuretic effect. A prescription for KCl, 20 mEq tablets, #10 was called to his pharmacy. He'll pick it up in the morning and begin using the tablets twice a day.

## 2016-01-20 NOTE — Pre-Procedure Instructions (Signed)
FAXED ABNORMAL K+ 3.2 TO DR BYRNETTS OFFICE-FAX CONFIRMATION RECEIVED-OFFICE CLOSES ON FRIDAYS @ 1PM SO I WILL CALL ON Monday TO MAKE SURE OFFICE RECEIVED FAX

## 2016-01-23 ENCOUNTER — Ambulatory Visit
Admission: RE | Admit: 2016-01-23 | Discharge: 2016-01-23 | Disposition: A | Discharge status: Home / Self Care | Payer: PPO | Source: Ambulatory Visit | Attending: General Surgery | Admitting: General Surgery

## 2016-01-23 DIAGNOSIS — I2510 Atherosclerotic heart disease of native coronary artery without angina pectoris: Secondary | ICD-10-CM | POA: Insufficient documentation

## 2016-01-23 DIAGNOSIS — J432 Centrilobular emphysema: Secondary | ICD-10-CM | POA: Diagnosis not present

## 2016-01-23 DIAGNOSIS — R9389 Abnormal findings on diagnostic imaging of other specified body structures: Secondary | ICD-10-CM

## 2016-01-23 DIAGNOSIS — R938 Abnormal findings on diagnostic imaging of other specified body structures: Secondary | ICD-10-CM | POA: Diagnosis not present

## 2016-01-23 DIAGNOSIS — D0361 Melanoma in situ of right upper limb, including shoulder: Secondary | ICD-10-CM | POA: Diagnosis not present

## 2016-01-23 DIAGNOSIS — R918 Other nonspecific abnormal finding of lung field: Secondary | ICD-10-CM | POA: Diagnosis not present

## 2016-01-23 DIAGNOSIS — Z9889 Other specified postprocedural states: Secondary | ICD-10-CM | POA: Insufficient documentation

## 2016-01-24 ENCOUNTER — Encounter: Payer: Self-pay | Admitting: General Surgery

## 2016-01-24 ENCOUNTER — Encounter
Admission: RE | Admit: 2016-01-24 | Discharge: 2016-01-24 | Disposition: A | Discharge status: Home / Self Care | Payer: PPO | Source: Ambulatory Visit | Attending: General Surgery | Admitting: General Surgery

## 2016-01-24 ENCOUNTER — Encounter
Admission: RE | Disposition: A | Discharge status: Home / Self Care | Payer: Self-pay | Source: Ambulatory Visit | Attending: General Surgery

## 2016-01-24 ENCOUNTER — Ambulatory Visit
Admission: RE | Admit: 2016-01-24 | Discharge: 2016-01-24 | Disposition: A | Discharge status: Home / Self Care | Payer: PPO | Source: Ambulatory Visit | Attending: General Surgery | Admitting: General Surgery

## 2016-01-24 ENCOUNTER — Ambulatory Visit: Payer: PPO | Admitting: Anesthesiology

## 2016-01-24 DIAGNOSIS — Z807 Family history of other malignant neoplasms of lymphoid, hematopoietic and related tissues: Secondary | ICD-10-CM | POA: Insufficient documentation

## 2016-01-24 DIAGNOSIS — I2510 Atherosclerotic heart disease of native coronary artery without angina pectoris: Secondary | ICD-10-CM | POA: Diagnosis not present

## 2016-01-24 DIAGNOSIS — C44622 Squamous cell carcinoma of skin of right upper limb, including shoulder: Secondary | ICD-10-CM

## 2016-01-24 DIAGNOSIS — Z888 Allergy status to other drugs, medicaments and biological substances status: Secondary | ICD-10-CM | POA: Diagnosis not present

## 2016-01-24 DIAGNOSIS — C4361 Malignant melanoma of right upper limb, including shoulder: Secondary | ICD-10-CM | POA: Diagnosis not present

## 2016-01-24 DIAGNOSIS — Z87891 Personal history of nicotine dependence: Secondary | ICD-10-CM | POA: Diagnosis not present

## 2016-01-24 DIAGNOSIS — D0361 Melanoma in situ of right upper limb, including shoulder: Secondary | ICD-10-CM

## 2016-01-24 DIAGNOSIS — Z79899 Other long term (current) drug therapy: Secondary | ICD-10-CM | POA: Diagnosis not present

## 2016-01-24 DIAGNOSIS — C439 Malignant melanoma of skin, unspecified: Secondary | ICD-10-CM

## 2016-01-24 DIAGNOSIS — Z955 Presence of coronary angioplasty implant and graft: Secondary | ICD-10-CM | POA: Insufficient documentation

## 2016-01-24 DIAGNOSIS — Z7982 Long term (current) use of aspirin: Secondary | ICD-10-CM | POA: Diagnosis not present

## 2016-01-24 DIAGNOSIS — I10 Essential (primary) hypertension: Secondary | ICD-10-CM | POA: Diagnosis not present

## 2016-01-24 HISTORY — DX: Malignant melanoma of skin, unspecified: C43.9

## 2016-01-24 HISTORY — PX: EXCISION MELANOMA WITH SENTINEL LYMPH NODE BIOPSY: SHX5628

## 2016-01-24 LAB — POCT I-STAT 4, (NA,K, GLUC, HGB,HCT)
Glucose, Bld: 106 mg/dL — ABNORMAL HIGH (ref 65–99)
HEMATOCRIT: 47 % (ref 39.0–52.0)
HEMOGLOBIN: 16 g/dL (ref 13.0–17.0)
Potassium: 3.8 mmol/L (ref 3.5–5.1)
Sodium: 141 mmol/L (ref 135–145)

## 2016-01-24 SURGERY — EXCISION, MELANOMA, WITH SENTINEL LYMPH NODE BIOPSY
Anesthesia: General | Site: Shoulder | Laterality: Right | Wound class: Clean

## 2016-01-24 MED ORDER — OXYCODONE HCL 5 MG PO TABS
ORAL_TABLET | ORAL | Status: AC
  Filled 2016-01-24: qty 1

## 2016-01-24 MED ORDER — FAMOTIDINE 20 MG PO TABS
20.0000 mg | ORAL_TABLET | Freq: Once | ORAL | Status: AC
  Administered 2016-01-24: 20 mg via ORAL

## 2016-01-24 MED ORDER — BUPIVACAINE HCL (PF) 0.5 % IJ SOLN
INTRAMUSCULAR | Status: AC
  Filled 2016-01-24: qty 30

## 2016-01-24 MED ORDER — ONDANSETRON HCL 4 MG/2ML IJ SOLN
INTRAMUSCULAR | Status: DC | PRN
  Administered 2016-01-24: 4 mg via INTRAVENOUS

## 2016-01-24 MED ORDER — FENTANYL CITRATE (PF) 100 MCG/2ML IJ SOLN
25.0000 ug | INTRAMUSCULAR | Status: DC | PRN
Start: 2016-01-24 — End: 2016-01-24

## 2016-01-24 MED ORDER — IPRATROPIUM-ALBUTEROL 0.5-2.5 (3) MG/3ML IN SOLN: 3.0000 mL | Freq: Once | RESPIRATORY_TRACT | Status: DC | PRN

## 2016-01-24 MED ORDER — FENTANYL CITRATE (PF) 100 MCG/2ML IJ SOLN
INTRAMUSCULAR | Status: DC | PRN
  Administered 2016-01-24: 50 ug via INTRAVENOUS

## 2016-01-24 MED ORDER — ACETAMINOPHEN 10 MG/ML IV SOLN
INTRAVENOUS | Status: DC | PRN
  Administered 2016-01-24: 1000 mg via INTRAVENOUS

## 2016-01-24 MED ORDER — MIDAZOLAM HCL 2 MG/2ML IJ SOLN
INTRAMUSCULAR | Status: DC | PRN
  Administered 2016-01-24: 1 mg via INTRAVENOUS

## 2016-01-24 MED ORDER — KETOROLAC TROMETHAMINE 30 MG/ML IJ SOLN
INTRAMUSCULAR | Status: DC | PRN
  Administered 2016-01-24: 30 mg via INTRAVENOUS

## 2016-01-24 MED ORDER — HYDROCODONE-ACETAMINOPHEN 5-325 MG PO TABS: 1.0000 | ORAL_TABLET | ORAL | Status: DC | PRN

## 2016-01-24 MED ORDER — OXYCODONE HCL 5 MG PO TABS: 5.0000 mg | ORAL_TABLET | Freq: Once | ORAL | Status: DC | PRN

## 2016-01-24 MED ORDER — OXYCODONE HCL 5 MG/5ML PO SOLN: 5.0000 mg | Freq: Once | ORAL | Status: DC | PRN

## 2016-01-24 MED ORDER — METHYLENE BLUE 0.5 % INJ SOLN
INTRAVENOUS | Status: AC
Start: 2016-01-24 — End: 2016-01-24
  Filled 2016-01-24: qty 10

## 2016-01-24 MED ORDER — BUPIVACAINE HCL (PF) 0.5 % IJ SOLN
INTRAMUSCULAR | Status: DC | PRN
  Administered 2016-01-24: 30 mL

## 2016-01-24 MED ORDER — GLYCOPYRROLATE 0.2 MG/ML IJ SOLN
INTRAMUSCULAR | Status: DC | PRN
  Administered 2016-01-24: 0.6 mg via INTRAVENOUS

## 2016-01-24 MED ORDER — PHENYLEPHRINE HCL 10 MG/ML IJ SOLN
INTRAMUSCULAR | Status: DC | PRN
  Administered 2016-01-24: 100 ug via INTRAVENOUS

## 2016-01-24 MED ORDER — LACTATED RINGERS IV SOLN
INTRAVENOUS | Status: DC
  Administered 2016-01-24 (×3): via INTRAVENOUS

## 2016-01-24 MED ORDER — PROPOFOL 10 MG/ML IV BOLUS
INTRAVENOUS | Status: DC | PRN
  Administered 2016-01-24 (×2): 150 mg via INTRAVENOUS

## 2016-01-24 MED ORDER — ACETAMINOPHEN 10 MG/ML IV SOLN
INTRAVENOUS | Status: AC
  Filled 2016-01-24: qty 100

## 2016-01-24 MED ORDER — TECHNETIUM TC 99M SULFUR COLLOID FILTERED
500.0000 | Freq: Once | INTRAVENOUS | Status: AC | PRN
  Administered 2016-01-24: 523 via INTRADERMAL

## 2016-01-24 MED ORDER — LIDOCAINE HCL (CARDIAC) 20 MG/ML IV SOLN
INTRAVENOUS | Status: DC | PRN
  Administered 2016-01-24: 30 mg via INTRAVENOUS

## 2016-01-24 MED ORDER — FAMOTIDINE 20 MG PO TABS
ORAL_TABLET | ORAL | Status: AC
  Administered 2016-01-24: 20 mg via ORAL
  Filled 2016-01-24: qty 1

## 2016-01-24 MED ORDER — ROCURONIUM BROMIDE 100 MG/10ML IV SOLN
INTRAVENOUS | Status: DC | PRN
  Administered 2016-01-24: 40 mg via INTRAVENOUS

## 2016-01-24 MED ORDER — NEOSTIGMINE METHYLSULFATE 10 MG/10ML IV SOLN
INTRAVENOUS | Status: DC | PRN
Start: 2016-01-24 — End: 2016-01-24
  Administered 2016-01-24: 3 mg via INTRAVENOUS

## 2016-01-24 MED ORDER — EPHEDRINE SULFATE 50 MG/ML IJ SOLN
INTRAMUSCULAR | Status: DC | PRN
  Administered 2016-01-24: 10 mg via INTRAVENOUS

## 2016-01-24 SURGICAL SUPPLY — 33 items
CANISTER SUCT 1200ML W/VALVE (MISCELLANEOUS) ×4 IMPLANT
CHLORAPREP W/TINT 26ML (MISCELLANEOUS) ×4 IMPLANT
CLOSURE WOUND 1/2 X4 (GAUZE/BANDAGES/DRESSINGS) ×1
COVER PROBE FLX POLY STRL (MISCELLANEOUS) IMPLANT
DRAPE LAPAROTOMY 100X77 ABD (DRAPES) IMPLANT
DRESSING TELFA 4X3 1S ST N-ADH (GAUZE/BANDAGES/DRESSINGS) ×4 IMPLANT
DRSG TEGADERM 4X4.75 (GAUZE/BANDAGES/DRESSINGS) ×4 IMPLANT
ELECT REM PT RETURN 9FT ADLT (ELECTROSURGICAL) ×4
ELECTRODE REM PT RTRN 9FT ADLT (ELECTROSURGICAL) ×2 IMPLANT
GAUZE SPONGE 4X4 12PLY STRL (GAUZE/BANDAGES/DRESSINGS) IMPLANT
GLOVE BIO SURGEON STRL SZ7.5 (GLOVE) ×4 IMPLANT
GLOVE INDICATOR 8.0 STRL GRN (GLOVE) ×4 IMPLANT
GOWN STRL REUS W/ TWL LRG LVL3 (GOWN DISPOSABLE) ×4 IMPLANT
GOWN STRL REUS W/TWL LRG LVL3 (GOWN DISPOSABLE) ×4
KIT RM TURNOVER STRD PROC AR (KITS) ×4 IMPLANT
LABEL OR SOLS (LABEL) ×4 IMPLANT
NDL SAFETY 18GX1.5 (NEEDLE) ×4 IMPLANT
NDL SAFETY 22GX1.5 (NEEDLE) ×4 IMPLANT
PACK BASIN MINOR ARMC (MISCELLANEOUS) ×4 IMPLANT
SLEVE PROBE SENORX GAMMA FIND (MISCELLANEOUS) IMPLANT
STRIP CLOSURE SKIN 1/2X4 (GAUZE/BANDAGES/DRESSINGS) ×3 IMPLANT
SUT ETHILON 3-0 (SUTURE) ×4 IMPLANT
SUT ETHILON 4-0 (SUTURE) ×2
SUT ETHILON 4-0 FS2 18XMFL BLK (SUTURE) ×2
SUT VIC AB 2-0 CT1 (SUTURE) ×4 IMPLANT
SUT VIC AB 2-0 CT2 27 (SUTURE) ×8 IMPLANT
SUT VIC AB 3-0 SH 27 (SUTURE) ×2
SUT VIC AB 3-0 SH 27X BRD (SUTURE) ×2 IMPLANT
SUT VIC AB 4-0 FS2 27 (SUTURE) ×4 IMPLANT
SUT VICRYL+ 3-0 144IN (SUTURE) ×4 IMPLANT
SUTURE ETHLN 4-0 FS2 18XMF BLK (SUTURE) ×2 IMPLANT
SWABSTK COMLB BENZOIN TINCTURE (MISCELLANEOUS) ×4 IMPLANT
SYRINGE 10CC LL (SYRINGE) ×4 IMPLANT

## 2016-01-24 NOTE — H&P (Signed)
Procedure reviewed with the patient. Site for excision marked. Sentinel node imaging results pending.

## 2016-01-24 NOTE — Anesthesia Postprocedure Evaluation (Signed)
Anesthesia Post Note  Patient: JAVONTAE MARLETTE  Procedure(s) Performed: Procedure(s) (LRB): EXCISION MELANOMA Right Shoulder (Right)  Patient location during evaluation: PACU Anesthesia Type: General Level of consciousness: awake and alert Pain management: pain level controlled Vital Signs Assessment: post-procedure vital signs reviewed and stable Respiratory status: spontaneous breathing, nonlabored ventilation, respiratory function stable and patient connected to nasal cannula oxygen Cardiovascular status: blood pressure returned to baseline and stable Postop Assessment: no signs of nausea or vomiting Anesthetic complications: no    Last Vitals:  Filed Vitals:   01/24/16 1445 01/24/16 1509  BP:  128/76  Pulse:  46  Temp: 36.3 C 36.4 C  Resp:  14    Last Pain: There were no vitals filed for this visit.               Precious Haws Sayra Frisby

## 2016-01-24 NOTE — Progress Notes (Signed)
I spoke with Dr. Phillip Heal from dermatology. 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 This is on the same area as his recently diagnosed melanoma.  The patient is scheduled for wide excision and sentinel node biopsy of the melanoma on the right posterior shoulder later today.  The case has been reviewed informally with medical oncology. We'll arrange for a PET/CT after he recovers from surgery and likely biopsy of the largest left lung lesion if the PET scan is positive.

## 2016-01-24 NOTE — Anesthesia Procedure Notes (Signed)
Procedure Name: Intubation Date/Time: 01/24/2016 1:24 PM Performed by: Courtney Paris Pre-anesthesia Checklist: Patient identified, Timeout performed, Emergency Drugs available, Suction available and Patient being monitored Patient Re-evaluated:Patient Re-evaluated prior to inductionOxygen Delivery Method: Circle system utilized Preoxygenation: Pre-oxygenation with 100% oxygen Intubation Type: IV induction and Combination inhalational/ intravenous induction Ventilation: Mask ventilation without difficulty Laryngoscope Size: Miller and 3 Grade View: Grade II Tube size: 7.5 mm Number of attempts: 1 Airway Equipment and Method: Stylet Placement Confirmation: ETT inserted through vocal cords under direct vision,  positive ETCO2,  CO2 detector and breath sounds checked- equal and bilateral Secured at: 21 cm Tube secured with: Tape Dental Injury: Teeth and Oropharynx as per pre-operative assessment

## 2016-01-24 NOTE — OR Nursing (Signed)
Anesthesia notified of 50 heart rate. No new orders.

## 2016-01-24 NOTE — Anesthesia Preprocedure Evaluation (Signed)
Anesthesia Evaluation  Patient identified by MRN, date of birth, ID band Patient awake    Reviewed: Allergy & Precautions, H&P , NPO status , Patient's Chart, lab work & pertinent test results  History of Anesthesia Complications Negative for: history of anesthetic complications  Airway Mallampati: III  TM Distance: >3 FB Neck ROM: full    Dental  (+) Poor Dentition, Chipped, Missing   Pulmonary neg shortness of breath, former smoker,    Pulmonary exam normal breath sounds clear to auscultation       Cardiovascular Exercise Tolerance: Good hypertension, (-) angina+ CAD and + Cardiac Stents  (-) DOE Normal cardiovascular exam Rhythm:regular Rate:Normal     Neuro/Psych negative neurological ROS  negative psych ROS   GI/Hepatic negative GI ROS, Neg liver ROS,   Endo/Other  negative endocrine ROS  Renal/GU negative Renal ROS  negative genitourinary   Musculoskeletal   Abdominal   Peds  Hematology negative hematology ROS (+)   Anesthesia Other Findings Past Medical History:   Hypertension                                                 CAD (coronary artery disease)                                Cancer (HCC)                                                   Comment:left arm  Past Surgical History:   cardiac stents                                   2011           Comment:Angioplasty / Stenting Femoral-X2   arm surgery                                     Left                Comment:arm   COLONOSCOPY                                      04/01/12     BMI    Body Mass Index   27.97 kg/m 2      Reproductive/Obstetrics negative OB ROS                             Anesthesia Physical Anesthesia Plan  ASA: III  Anesthesia Plan: General ETT   Post-op Pain Management:    Induction:   Airway Management Planned:   Additional Equipment:   Intra-op Plan:   Post-operative Plan:    Informed Consent: I have reviewed the patients History and Physical, chart, labs and discussed the procedure including the risks, benefits and alternatives for the proposed anesthesia with the patient or authorized representative who has indicated his/her understanding and acceptance.  Dental Advisory Given  Plan Discussed with: Anesthesiologist, CRNA and Surgeon  Anesthesia Plan Comments:         Anesthesia Quick Evaluation

## 2016-01-24 NOTE — Transfer of Care (Signed)
2 Immediate Anesthesia Transfer of Care Note  Patient: Glen Davis  Procedure(s) Performed: Procedure(s): EXCISION MELANOMA Right Shoulder (Right)  Patient Location: PACU  Anesthesia Type:General  Level of Consciousness: awake, oriented and patient cooperative  Airway & Oxygen Therapy: Patient Spontanous Breathing and Patient connected to face mask oxygen  Post-op Assessment: Report given to RN and Post -op Vital signs reviewed and stable  Post vital signs: Reviewed  Last Vitals:  Filed Vitals:   01/24/16 1226 01/24/16 1419  BP:  138/82  Pulse: 55   Temp:  36.6 C  Resp:  16    Complications: No apparent anesthesia complications

## 2016-01-24 NOTE — Op Note (Signed)
Preoperative diagnosis: Melanoma right posterior shoulder.  Postoperative diagnosis: Same.  Operative procedure: Wide local excision with primary closure.  Operative surgeon: Ollen Bowl, M.D.  Anesthesia: Gen. endotracheal, Marcaine 0.5%, plain 30 mL local infiltration.  Clinical note: This 75 year old male was recently identified with a melanoma on the right posterior shoulder. He was injected with technetium this morning for sentinel node biopsy but there was no transit any regional node basin. He was considered a candidate for wide excision.  Operative note: With the patient under adequate general endotracheal anesthesia he was rolled to the left lateral decubitus position and supported on the next layer roll and beanbag. The right posterior shoulder was prepped with ChloraPrep and draped. Marcaine was infiltrated for postoperative analgesia. A 2.5 m wide elliptical incision was made and carried at the skin subtenon's tissue. The remaining dissection was completed to the deep fascia. This was carried circumferentially and the specimen orientated and sent in formalin for routine histology. The tissue above the muscle layer was elevated circumferentially for 5 cm with electro-cautery. The fascia was an approximately with interrupted 2-0 Vicryl figure-of-eight sutures. A nodal air to-0 Vicryl simple sutures were used to the deep dermal layer and then a running 3-0 Vicryl suture used for the skin. Benzoin, Steri-Strips, Telfa and Tegaderm dressing was applied.  The patient tolerated the procedure well and was taken recovery room in good condition.

## 2016-01-24 NOTE — Discharge Instructions (Signed)

## 2016-01-25 ENCOUNTER — Other Ambulatory Visit: Payer: Self-pay | Admitting: *Deleted

## 2016-01-25 ENCOUNTER — Telehealth: Payer: Self-pay | Admitting: *Deleted

## 2016-01-25 ENCOUNTER — Encounter: Payer: Self-pay | Admitting: General Surgery

## 2016-01-25 DIAGNOSIS — C4361 Malignant melanoma of right upper limb, including shoulder: Secondary | ICD-10-CM

## 2016-01-25 NOTE — Telephone Encounter (Signed)
Patient has been scheduled for a PET scan at [facility] for 02-01-16 at 8:30 am (arrive 8 am). Prep instructions have been reviewed with the patient. He verbalizes understanding.

## 2016-01-25 NOTE — Telephone Encounter (Signed)
-----   Message from Robert Bellow, MD sent at 01/25/2016  8:19 AM EST ----- Please schedule a PET/CT for the patient, diagnosis malignant melanoma, C43.61 Thank you

## 2016-01-27 ENCOUNTER — Telehealth: Payer: Self-pay | Admitting: General Surgery

## 2016-01-27 LAB — SURGICAL PATHOLOGY

## 2016-01-27 NOTE — Telephone Encounter (Signed)
No residual melanoma.  Patient reports doing well. PET CT scheduled for next week for multiple pulmonary nodules.

## 2016-01-30 ENCOUNTER — Telehealth: Payer: Self-pay | Admitting: General Surgery

## 2016-01-30 DIAGNOSIS — Z7189 Other specified counseling: Secondary | ICD-10-CM | POA: Insufficient documentation

## 2016-01-30 DIAGNOSIS — Z7185 Encounter for immunization safety counseling: Secondary | ICD-10-CM | POA: Insufficient documentation

## 2016-01-30 DIAGNOSIS — Z0000 Encounter for general adult medical examination without abnormal findings: Secondary | ICD-10-CM | POA: Diagnosis not present

## 2016-01-30 NOTE — Telephone Encounter (Signed)
SPOUSE CALLED THIS AM @ 10:21 TO SEE IF PT STILL NEEDED TO HAVE PET SCAN DONE.PER DR BYRNETT'S NOTE DATED 01-27-16 PT DOES/MTH

## 2016-02-01 ENCOUNTER — Ambulatory Visit
Admission: RE | Admit: 2016-02-01 | Discharge: 2016-02-01 | Disposition: A | Discharge status: Home / Self Care | Payer: PPO | Source: Ambulatory Visit | Attending: General Surgery | Admitting: General Surgery

## 2016-02-01 ENCOUNTER — Other Ambulatory Visit: Payer: Self-pay | Admitting: General Surgery

## 2016-02-01 DIAGNOSIS — C4361 Malignant melanoma of right upper limb, including shoulder: Secondary | ICD-10-CM

## 2016-02-01 DIAGNOSIS — R918 Other nonspecific abnormal finding of lung field: Secondary | ICD-10-CM | POA: Insufficient documentation

## 2016-02-01 LAB — GLUCOSE, CAPILLARY: GLUCOSE-CAPILLARY: 105 mg/dL — AB (ref 65–99)

## 2016-02-01 MED ORDER — FLUDEOXYGLUCOSE F - 18 (FDG) INJECTION
12.2900 | Freq: Once | INTRAVENOUS | Status: AC | PRN
  Administered 2016-02-01: 12.29 via INTRAVENOUS

## 2016-02-02 ENCOUNTER — Other Ambulatory Visit: Payer: Self-pay

## 2016-02-02 ENCOUNTER — Ambulatory Visit (INDEPENDENT_AMBULATORY_CARE_PROVIDER_SITE_OTHER): Payer: PPO | Admitting: General Surgery

## 2016-02-02 ENCOUNTER — Encounter: Payer: Self-pay | Admitting: General Surgery

## 2016-02-02 VITALS — BP 110/64 | HR 58 | Resp 12 | Ht 74.0 in | Wt 217.0 lb

## 2016-02-02 DIAGNOSIS — E041 Nontoxic single thyroid nodule: Secondary | ICD-10-CM

## 2016-02-02 DIAGNOSIS — C4361 Malignant melanoma of right upper limb, including shoulder: Secondary | ICD-10-CM | POA: Diagnosis not present

## 2016-02-02 DIAGNOSIS — C439 Malignant melanoma of skin, unspecified: Secondary | ICD-10-CM

## 2016-02-02 DIAGNOSIS — R918 Other nonspecific abnormal finding of lung field: Secondary | ICD-10-CM | POA: Diagnosis not present

## 2016-02-02 HISTORY — DX: Nontoxic single thyroid nodule: E04.1

## 2016-02-02 NOTE — Progress Notes (Signed)
Patient ID: Glen Davis, male   DOB: [birth year], 75 y.o.   MRN: [id number]  Chief Complaint  Patient presents with  . Routine Post Op    HPI Glen Davis is a 75 y.o. male.  Here today for postoperative visit, excision melanoma right shoulder. He states he is doing well. He went back to work Monday. PET scan was yesterday.   HPI  Past Medical History  Diagnosis Date  . Hypertension   . CAD (coronary artery disease)   . Cancer [facility])     left arm    Past Surgical History  Procedure Laterality Date  . Cardiac stents  2011    Angioplasty / Stenting Femoral-X2  . Arm surgery Left     arm  . Colonoscopy  04/01/12  . Excision melanoma with sentinel lymph node biopsy Right 01/24/2016    Procedure: EXCISION MELANOMA Right Shoulder;  Surgeon: Robert Bellow, MD;  Location: [facility] ORS;  Service: General;  Laterality: Right;    Family History  Problem Relation Age of Onset  . Hodgkin's lymphoma Daughter     Social History Social History  Substance Use Topics  . Smoking status: Former Smoker    Types: Cigarettes    Start date: 01/18/1956    Quit date: 01/17/1969  . Smokeless tobacco: None  . Alcohol Use: 0.0 oz/week    0 Standard drinks or equivalent per week     Comment: BEER [facility]    Allergies  Allergen Reactions  . Lisinopril Swelling  . Other Itching    Nitroglycerin Patch.    Current Outpatient Prescriptions  Medication Sig Dispense Refill  . amLODipine (NORVASC) 5 MG tablet TAKE 1 TABLET (5 MG TOTAL) BY MOUTH ONCE DAILY-AM  1  . aspirin 81 MG tablet Take 81 mg by mouth daily.    Marland Kitchen atorvastatin (LIPITOR) 40 MG tablet TAKE 1 TABLET (40 MG TOTAL) BY MOUTH ONCE DAILY-AM  11  . hydrochlorothiazide (HYDRODIURIL) 12.5 MG tablet Take 12.5 mg by mouth daily.     Marland Kitchen losartan (COZAAR) 50 MG tablet Take 50 mg by mouth every morning.     . metoprolol succinate (TOPROL-XL) 50 MG 24 hr tablet Take 50 mg by mouth every morning.     . tamsulosin (FLOMAX) 0.4 MG CAPS capsule Take  0.4 mg by mouth every morning.      No current facility-administered medications for this visit.    Review of Systems Review of Systems  Constitutional: Negative.   Respiratory: Negative.   Cardiovascular: Negative.     Blood pressure 110/64, pulse 58, resp. rate 12, height '6\' 2"'$  (1.88 m), weight 217 lb (98.431 kg).  Physical Exam Physical Exam  Constitutional: He is oriented to person, place, and time. He appears well-developed and well-nourished.  HENT:  Mouth/Throat: Oropharynx is clear and moist.  Eyes: Conjunctivae are normal. No scleral icterus.  Neck: Neck supple.  Thickening lower left thyroid lobe  Lymphadenopathy:    He has no cervical adenopathy.  Neurological: He is alert and oriented to person, place, and time.  Skin: Skin is warm and dry.  Right shoulder incision well healed.  Psychiatric: His behavior is normal.    Data Reviewed  Pathology on the resected right shoulder melanoma  :A. SKIN, RIGHT POSTERIOR SHOULDER; WIDE EXCISION:  - NEGATIVE FOR RESIDUAL MELANOMA.  - PRIOR SURGICAL SITE.  - RESIDUAL ATYPICAL MELANOCYTES EXTENDING TO 9:00 / MEDIAL TIP MARGIN.  - MARKED SOLAR ELASTOSIS.   Considering the superior and Inferior margins  were negative for atypical melanocytes, and these were within a millimeter of the surgical bed and the medial margin is positive for atypical last sites, 4 cm from the original site of excision, I suspect that this is a separate area of focal dermal change rather than an extension from the original site of the 0.4 mm deep melanoma on the right posterior shoulder. Sintering the extensive skin changes evident on the back, continued surveillance of this area is warranted rather than reexcision.    PET/CT:   Dominant 2.2 cm left thyroid nodule.  No soft tissue hypermetabolism to correspond to the patient's known right shoulder/back melanoma.  Scattered bilateral pulmonary nodules measuring up to 14 mm in the left lung apex,  mildly FDG avid. While melanoma metastases are generally more hypermetabolic, the CT appearance remains suspicious for metastatic disease. Consider percutaneous sampling of a dominant left upper lobe nodule as clinically warranted.  Ultrasound examination of the thyroid was undertaken to better assess the left lobe nodule identified on PET/CT.   The right lobe of the thyroid measures 1.8 x 1.9 x 5.4 cm in maximum dimension. In the inferior pole there is a heterogeneous nodule without significant vascular flow measuring 0.8 x 0.8 x 0.85 cm.  The left lobe of the thyroid measures 1.9 x 2.3 x 5.6 cm. This is dominated by a sizable 1.8 x 1.9 cm mass in the lower portion of the left lobe. This is heterogeneous in appearance with several cystic areas.  The patient was amenable to FNA sampling. This was completed using 1 mL of 1% plain Xylocaine. Multiple passes through out the nodule were completed in slides 8 were prepared for cytology. Procedure was well tolerated. No bleeding evident.  Assessment    Multiple pulmonary nodules. Unlikely related to superficial melanoma recently excised. No evidence of nodal disease on PET/CT.  Previous smoker, discontinued 30 years ago. Pulmonary nodules unlikely secondary to multiple foci of primary cancer.  Thyroid nodule, potential source for bilateral pulmonary metastatic spread, FNA completed today.    Plan   The patient will be notified when the FNA results are available. Prior to consideration of percutaneous biopsy of the left upper lobe lesion, we'll obtain input from the thoracic surgery service.     Patient has been scheduled for an appointment with Dr. Nestor Lewandowsky at the [facility] for 02-09-16 at 11 am.    PCP:  Dion Body This information has been scribed by Karie Fetch [place].   Robert Bellow 02/03/2016, 8:10 AM

## 2016-02-02 NOTE — Patient Instructions (Addendum)
The patient is aware to call back for any questions or concerns.  Appointment with Dr. Genevive Bi next week.

## 2016-02-03 DIAGNOSIS — E041 Nontoxic single thyroid nodule: Secondary | ICD-10-CM | POA: Insufficient documentation

## 2016-02-03 DIAGNOSIS — R918 Other nonspecific abnormal finding of lung field: Secondary | ICD-10-CM | POA: Insufficient documentation

## 2016-02-05 ENCOUNTER — Telehealth: Payer: Self-pay | Admitting: General Surgery

## 2016-02-05 NOTE — Telephone Encounter (Signed)
Notified cytology on the thyroid nodule was benign. Will f/u after upcoming assessment w/ Dr. Genevive Bi.  Will arrange f/u thyroid exam in one year.

## 2016-02-09 ENCOUNTER — Encounter: Payer: Self-pay | Admitting: Cardiothoracic Surgery

## 2016-02-09 ENCOUNTER — Inpatient Hospital Stay: Payer: PPO | Admitting: Cardiothoracic Surgery

## 2016-02-09 ENCOUNTER — Inpatient Hospital Stay: Payer: PPO | Attending: Cardiothoracic Surgery | Admitting: Cardiothoracic Surgery

## 2016-02-09 VITALS — BP 147/90 | HR 57 | Temp 95.6°F | Resp 18 | Ht 74.0 in | Wt 221.9 lb

## 2016-02-09 DIAGNOSIS — Z87891 Personal history of nicotine dependence: Secondary | ICD-10-CM | POA: Diagnosis not present

## 2016-02-09 DIAGNOSIS — R911 Solitary pulmonary nodule: Secondary | ICD-10-CM | POA: Diagnosis not present

## 2016-02-09 DIAGNOSIS — R918 Other nonspecific abnormal finding of lung field: Secondary | ICD-10-CM | POA: Insufficient documentation

## 2016-02-09 DIAGNOSIS — Z79899 Other long term (current) drug therapy: Secondary | ICD-10-CM | POA: Diagnosis not present

## 2016-02-09 DIAGNOSIS — I10 Essential (primary) hypertension: Secondary | ICD-10-CM | POA: Diagnosis not present

## 2016-02-09 DIAGNOSIS — Z7901 Long term (current) use of anticoagulants: Secondary | ICD-10-CM | POA: Insufficient documentation

## 2016-02-09 DIAGNOSIS — Z7982 Long term (current) use of aspirin: Secondary | ICD-10-CM | POA: Diagnosis not present

## 2016-02-09 DIAGNOSIS — I2510 Atherosclerotic heart disease of native coronary artery without angina pectoris: Secondary | ICD-10-CM | POA: Insufficient documentation

## 2016-02-09 NOTE — Progress Notes (Signed)
Patient ID: Glen Davis, male   DOB: 01/03/1941, 75 y.o.   MRN: [id number]  Chief Complaint  Patient presents with  . New Evaluation    Lung Nodule    Referred By Dr. Bary Castilla Reason for Referral lung nodules  HPI Location, Quality, Duration, Severity, Timing, Context, Modifying Factors, Associated Signs and Symptoms.  Glen Davis is a 75 y.o. male.  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 As part of that evaluation he underwent a chest CT. The chest CT revealed multiple bilateral pulmonary nodules. The largest nodules in the left upper lobe measuring about 11 mm. The patient then underwent a PET scan and the PET scan showed uptake in several of these larger nodules although the smaller ones did not appear on the PET. The patient is a past smoker. He quit many years ago and smoked about one pack cigarettes a day for 10-15 years. Of note is that he also has coronary artery disease and is currently taking aspirin and Plavix for that. He was able to stop that prior to his shoulder surgery.  He has approximately 10 alcoholic drinks per week. He denies any cardiopulmonary symptoms. He does not complain of any cough shortness of breath or fever or chills. Of note is that in 2004 and 2005 he did have several CT scans made for bilateral pulmonary nodules. The images themselves are archived and not available for review, however the reports are conflicting regarding the number and size of the nodules.    Past Medical History  Diagnosis Date  . Hypertension   . CAD (coronary artery disease)   . Cancer [facility])     left arm    Past Surgical History  Procedure Laterality Date  . Cardiac stents  2011    Angioplasty / Stenting Femoral-X2  . Arm surgery Left     arm  . Colonoscopy  04/01/12  . Excision melanoma with sentinel lymph node biopsy Right 01/24/2016    Procedure: EXCISION MELANOMA  Right Shoulder;  Surgeon: Robert Bellow, MD;  Location: [facility] ORS;  Service: General;  Laterality: Right;    Family History  Problem Relation Age of Onset  . Hodgkin's lymphoma Daughter 5    Social History Social History  Substance Use Topics  . Smoking status: Former Smoker    Types: Cigarettes    Start date: 01/18/1956    Quit date: 01/17/1969  . Smokeless tobacco: Not on file  . Alcohol Use: 0.0 oz/week    0 Standard drinks or equivalent per week     Comment: BEER OCC    Allergies  Allergen Reactions  . Lisinopril Swelling  . Other Itching    Nitroglycerin Patch.    Current Outpatient Prescriptions  Medication Sig Dispense Refill  . amLODipine (NORVASC) 5 MG tablet TAKE 1 TABLET (5 MG TOTAL) BY MOUTH ONCE DAILY-AM  1  . aspirin 81 MG tablet Take 81 mg by mouth daily.    Marland Kitchen atorvastatin (LIPITOR) 40 MG tablet TAKE 1 TABLET (40 MG TOTAL) BY MOUTH ONCE DAILY-AM  11  . hydrochlorothiazide (HYDRODIURIL) 12.5 MG tablet Take 12.5 mg by mouth daily.     Marland Kitchen losartan (COZAAR) 50 MG tablet Take 50 mg by mouth every morning.     . metoprolol succinate (TOPROL-XL) 50 MG 24 hr tablet Take 50 mg by mouth every morning.     . tamsulosin (FLOMAX)  0.4 MG CAPS capsule Take 0.4 mg by mouth every morning.      No current facility-administered medications for this visit.      Review of Systems A complete review of systems was asked and was negative except for the following positive findings  All  review of systems were negative.  Blood pressure 147/90, pulse 57, temperature 95.6 F (35.3 C), temperature source Tympanic, resp. rate 18, height '6\' 2"'$  (1.88 m), weight 221 lb 14.3 oz (100.65 kg), SpO2 97 %.  Physical Exam CONSTITUTIONAL:  Pleasant, well-developed, well-nourished, and in no acute distress. EYES: Pupils equal and reactive to light, Sclera non-icteric EARS, NOSE, MOUTH AND THROAT:  The oropharynx was clear.  Dentition is good repair.  Oral mucosa pink and moist. LYMPH  NODES:  Lymph nodes in the neck and axillae were normal RESPIRATORY:  Lungs were clear.  Normal respiratory effort without pathologic use of accessory muscles of respiration CARDIOVASCULAR: Heart was regular without murmurs.  There were no carotid bruits. GI: The abdomen was soft, nontender, and nondistended. There were no palpable masses. There was no hepatosplenomegaly. There were normal bowel sounds in all quadrants. GU:  Rectal deferred.   MUSCULOSKELETAL:  Normal muscle strength and tone.  No clubbing or cyanosis.   SKIN:  There were multiple areas on the back which were keratotic.Marland Kitchen  There were no nodules on palpation. NEUROLOGIC:  Sensation is normal.  Cranial nerves are grossly intact. PSYCH:  Oriented to person, place and time.  Mood and affect are normal.  There is a well healed surgical scar over the posterior right shoulder.  Data Reviewed CT scan and PET scan  I have personally reviewed the patient's imaging, laboratory findings and medical records.    Assessment    I have independently reviewed the patient's CT scan and PET scan. The nodule in the left upper lobe does appear to be amenable to percutaneous biopsy. I would recommend that at the present time. I did explain to him that surgical intervention may be necessary if the percutaneous biopsy does not reveal a specific diagnosis.    Plan    We will arrange for a CT-guided needle biopsy. We will stop his aspirin and Plavix at a minimum of 5 days prior to the biopsy. He'll come back to see me in 2 weeks to discuss those results.      Nestor Lewandowsky, MD 02/09/2016, 1:42 PM

## 2016-02-09 NOTE — Progress Notes (Signed)
No changes since last visit with Dr. Bary Castilla

## 2016-02-15 ENCOUNTER — Other Ambulatory Visit: Payer: Self-pay | Admitting: Radiology

## 2016-02-16 ENCOUNTER — Ambulatory Visit
Admission: RE | Admit: 2016-02-16 | Discharge: 2016-02-16 | Disposition: A | Discharge status: Home / Self Care | Payer: PPO | Source: Ambulatory Visit | Attending: Cardiothoracic Surgery | Admitting: Cardiothoracic Surgery

## 2016-02-16 ENCOUNTER — Ambulatory Visit
Admission: RE | Admit: 2016-02-16 | Discharge: 2016-02-16 | Disposition: A | Discharge status: Home / Self Care | Payer: PPO | Source: Ambulatory Visit | Attending: Interventional Radiology | Admitting: Interventional Radiology

## 2016-02-16 DIAGNOSIS — Z7982 Long term (current) use of aspirin: Secondary | ICD-10-CM | POA: Diagnosis not present

## 2016-02-16 DIAGNOSIS — Z955 Presence of coronary angioplasty implant and graft: Secondary | ICD-10-CM | POA: Diagnosis not present

## 2016-02-16 DIAGNOSIS — Z807 Family history of other malignant neoplasms of lymphoid, hematopoietic and related tissues: Secondary | ICD-10-CM | POA: Insufficient documentation

## 2016-02-16 DIAGNOSIS — I2510 Atherosclerotic heart disease of native coronary artery without angina pectoris: Secondary | ICD-10-CM | POA: Insufficient documentation

## 2016-02-16 DIAGNOSIS — I10 Essential (primary) hypertension: Secondary | ICD-10-CM | POA: Diagnosis not present

## 2016-02-16 DIAGNOSIS — R0489 Hemorrhage from other sites in respiratory passages: Secondary | ICD-10-CM | POA: Diagnosis not present

## 2016-02-16 DIAGNOSIS — R911 Solitary pulmonary nodule: Secondary | ICD-10-CM | POA: Insufficient documentation

## 2016-02-16 DIAGNOSIS — Z87891 Personal history of nicotine dependence: Secondary | ICD-10-CM | POA: Diagnosis not present

## 2016-02-16 DIAGNOSIS — R918 Other nonspecific abnormal finding of lung field: Secondary | ICD-10-CM | POA: Diagnosis not present

## 2016-02-16 DIAGNOSIS — Z85820 Personal history of malignant melanoma of skin: Secondary | ICD-10-CM | POA: Diagnosis not present

## 2016-02-16 DIAGNOSIS — Z9889 Other specified postprocedural states: Secondary | ICD-10-CM | POA: Diagnosis not present

## 2016-02-16 DIAGNOSIS — Z79899 Other long term (current) drug therapy: Secondary | ICD-10-CM | POA: Insufficient documentation

## 2016-02-16 LAB — CBC
HEMATOCRIT: 43.2 % (ref 40.0–52.0)
Hemoglobin: 15.8 g/dL (ref 13.0–18.0)
MCH: 33.7 pg (ref 26.0–34.0)
MCHC: 36.6 g/dL — AB (ref 32.0–36.0)
MCV: 92 fL (ref 80.0–100.0)
Platelets: 151 10*3/uL (ref 150–440)
RBC: 4.7 MIL/uL (ref 4.40–5.90)
RDW: 13.3 % (ref 11.5–14.5)
WBC: 6.7 10*3/uL (ref 3.8–10.6)

## 2016-02-16 LAB — PROTIME-INR
INR: 1.12
Prothrombin Time: 14.6 seconds (ref 11.4–15.0)

## 2016-02-16 LAB — APTT: APTT: 27 s (ref 24–36)

## 2016-02-16 MED ORDER — MIDAZOLAM HCL 2 MG/2ML IJ SOLN
INTRAMUSCULAR | Status: AC | PRN
  Administered 2016-02-16 (×3): 1 mg via INTRAVENOUS

## 2016-02-16 MED ORDER — FENTANYL CITRATE (PF) 100 MCG/2ML IJ SOLN
INTRAMUSCULAR | Status: AC | PRN
  Administered 2016-02-16 (×3): 50 ug via INTRAVENOUS

## 2016-02-16 MED ORDER — SODIUM CHLORIDE 0.9 % IV SOLN
Freq: Once | INTRAVENOUS | Status: AC
  Administered 2016-02-16: 09:00:00 via INTRAVENOUS

## 2016-02-16 MED ORDER — HYDROMORPHONE HCL 1 MG/ML IJ SOLN
1.0000 mg | INTRAMUSCULAR | Status: AC | PRN
  Administered 2016-02-16: 1 mg via INTRAVENOUS

## 2016-02-16 MED ORDER — FENTANYL CITRATE (PF) 100 MCG/2ML IJ SOLN
INTRAMUSCULAR | Status: DC | PRN
  Administered 2016-02-16: 50 ug via INTRAVENOUS

## 2016-02-16 MED ORDER — HYDROCODONE-ACETAMINOPHEN 5-325 MG PO TABS
1.0000 | ORAL_TABLET | ORAL | Status: DC | PRN
  Administered 2016-02-16: 2 via ORAL

## 2016-02-16 NOTE — H&P (Signed)
Chief Complaint: Pulmonary nodules and melanoma.  Referring Physician(s): Oaks,Timothy   History of Present Illness: Glen Davis is a 75 y.o. male with a history of resected right posterior shoulder/back malignant melanoma and pulmonary nodules. Largest nodules are 14 mm left apical and 13 mm anterior LUL subpleural nodules.  Suspicious by PET for metastatic nodules. Presents for CT guided lung biopsy.  Past Medical History  Diagnosis Date  . Hypertension   . CAD (coronary artery disease)   . Cancer [facility])     left arm    Past Surgical History  Procedure Laterality Date  . Cardiac stents  2011    Angioplasty / Stenting Femoral-X2  . Arm surgery Left     arm  . Colonoscopy  04/01/12  . Excision melanoma with sentinel lymph node biopsy Right 01/24/2016    Procedure: EXCISION MELANOMA Right Shoulder;  Surgeon: Robert Bellow, MD;  Location: [facility] ORS;  Service: General;  Laterality: Right;    Allergies: Lisinopril and Other  Medications: Prior to Admission medications   Medication Sig Start Date End Date Taking? Authorizing Provider  amLODipine (NORVASC) 5 MG tablet TAKE 1 TABLET (5 MG TOTAL) BY MOUTH ONCE DAILY-AM 12/19/15  Yes Historical Provider, MD  atorvastatin (LIPITOR) 40 MG tablet TAKE 1 TABLET (40 MG TOTAL) BY MOUTH ONCE DAILY-AM 12/22/15  Yes Historical Provider, MD  clopidogrel (PLAVIX) 75 MG tablet Take 75 mg by mouth daily.   Yes Historical Provider, MD  hydrochlorothiazide (HYDRODIURIL) 12.5 MG tablet Take 12.5 mg by mouth daily.  01/03/15  Yes Historical Provider, MD  losartan (COZAAR) 50 MG tablet Take 50 mg by mouth every morning.  08/03/15  Yes Historical Provider, MD  metoprolol succinate (TOPROL-XL) 50 MG 24 hr tablet Take 50 mg by mouth every morning.  10/17/15  Yes Historical Provider, MD  tamsulosin (FLOMAX) 0.4 MG CAPS capsule Take 0.4 mg by mouth every morning.  01/24/15  Yes Historical Provider, MD  aspirin 81 MG tablet Take 81 mg by mouth daily.     Historical Provider, MD     Family History  Problem Relation Age of Onset  . Hodgkin's lymphoma Daughter 5    Social History   Social History  . Marital Status: Married    Spouse Name: N/A  . Number of Children: N/A  . Years of Education: N/A   Social History Main Topics  . Smoking status: Former Smoker    Types: Cigarettes    Start date: 01/18/1956    Quit date: 01/17/1969  . Smokeless tobacco: None  . Alcohol Use: 0.0 oz/week    0 Standard drinks or equivalent per week     Comment: BEER OCC  . Drug Use: No  . Sexual Activity: Not Asked   Other Topics Concern  . None   Social History Narrative    ECOG Status: 0 - Asymptomatic  Review of Systems: A 12 point ROS discussed and pertinent positives are indicated in the HPI above.  All other systems are negative.  Review of Systems  Constitutional: Negative.   HENT: Negative.   Respiratory: Negative.   Cardiovascular: Negative.   Gastrointestinal: Negative.   Genitourinary: Negative.   Musculoskeletal: Negative.   Neurological: Negative.     Vital Signs: BP 129/83 mmHg  Pulse 45  Resp 10  SpO2 94%  Physical Exam  Constitutional: He is oriented to person, place, and time. He appears well-developed and well-nourished. No distress.  HENT:  Head: Normocephalic and atraumatic.  Neck:  Neck supple. No JVD present. No tracheal deviation present. No thyromegaly present.  Cardiovascular: Normal rate, regular rhythm and normal heart sounds.  Exam reveals no gallop and no friction rub.   No murmur heard. Pulmonary/Chest: Effort normal and breath sounds normal. No stridor. No respiratory distress. He has no wheezes. He has no rales. He exhibits no tenderness.  Abdominal: Soft. He exhibits no distension. There is no tenderness.  Musculoskeletal: He exhibits no edema.  Lymphadenopathy:    He has no cervical adenopathy.  Neurological: He is alert and oriented to person, place, and time.  Skin: He is not diaphoretic.    Vitals reviewed.   Mallampati Score:  MD Evaluation Airway: WNL Heart: WNL Abdomen: WNL Chest/ Lungs: WNL ASA  Classification: 2 Mallampati/Airway Score: One  Imaging: Ct Chest Wo Contrast  01/23/2016  CLINICAL DATA:  75 year old male with history of melanoma scheduled for resection from the right upper back/shoulder on 01/24/2016. Former smoker (quit in 1970). EXAM: CT CHEST WITHOUT CONTRAST TECHNIQUE: Multidetector CT imaging of the chest was performed following the standard protocol without IV contrast. COMPARISON:  Chest CT 05/09/2004. FINDINGS: Mediastinum/Lymph Nodes: Heart size is normal. There is no significant pericardial fluid, thickening or pericardial calcification. There is atherosclerosis of the thoracic aorta, the great vessels of the mediastinum and the coronary arteries, including calcified atherosclerotic plaque in the left anterior descending and right coronary arteries. Coronary artery stent in the left anterior descending coronary artery. No pathologically enlarged mediastinal or hilar lymph nodes. Please note that accurate exclusion of hilar adenopathy is limited on noncontrast CT scans. Esophagus is unremarkable in appearance. No axillary lymphadenopathy. Lungs/Pleura: There are multiple pulmonary nodules scattered throughout the lungs bilaterally, largest of which is in the left upper lobe (image 14 of series 3) measuring 10 x 13 mm. Another large left upper lobe lesion is noted anteriorly (image 30 of series 3) measuring 8 x 11 mm. The largest lesion in the right lung is in the right lower lobe (image 47 of series 3) measuring 7 mm in diameter. No acute consolidative airspace disease. No pleural effusions. Moderate centrilobular emphysema. Mild diffuse bronchial wall thickening. Upper Abdomen: Unremarkable. Musculoskeletal/Soft Tissues: There are no aggressive appearing lytic or blastic lesions noted in the visualized portions of the skeleton. IMPRESSION: 1. Multiple  pulmonary nodules scattered throughout the lungs bilaterally, concerning for metastatic disease. The largest of these is in the left upper lobe with a mean diameter of 11.5 mm. Further evaluation with PET-CT is suggested. 2. Mild diffuse bronchial wall thickening with moderate centrilobular emphysema; imaging findings suggestive of underlying COPD. 3. Atherosclerosis, including 2 vessel coronary artery disease. Status post PTCI to the LAD. Electronically Signed   By: Vinnie Langton M.D.   On: 01/23/2016 16:08   US Soft Tissue Head/neck  02/03/2016  Ultrasound examination of the thyroid was undertaken to better assess the left lobe nodule identified on PET/CT. The right lobe of the thyroid measures 1.8 x 1.9 x 5.4 cm in maximum dimension. In the inferior pole there is a heterogeneous nodule without significant vascular flow measuring 0.8 x 0.8 x 0.85 cm. The left lobe of the thyroid measures 1.9 x 2.3 x 5.6 cm. This is dominated by a sizable 1.8 x 1.9 cm mass in the lower portion of the left lobe. This is heterogeneous in appearance with several cystic areas. The patient was amenable to FNA sampling. This was completed using 1 mL of 1% plain Xylocaine. Multiple passes through out the nodule were completed in  slides 8 were prepared for cytology. Procedure was well tolerated. No bleeding evident.   Nm Pet Image Initial (pi) Whole Body  02/02/2016  CLINICAL DATA:  Initial treatment strategy for right shoulder melanoma EXAM: NUCLEAR MEDICINE PET WHOLE BODY TECHNIQUE: 12.29 mCi F-18 FDG was injected intravenously. Full-ring PET imaging was performed from the vertex to the feet after the radiotracer. CT data was obtained and used for attenuation correction and anatomic localization. FASTING BLOOD GLUCOSE:  Value: 105 mg/dl COMPARISON:  CT chest dated 01/23/2016 FINDINGS: Head/Neck: No hypermetabolic lymph nodes in the neck. Chest: Evaluation of lung parenchyma is constrained by respiratory motion. Multiple  scattered bilateral pulmonary nodules, better visualized on recent CT, including: --14 x 10 mm nodule in the left lung apex (series 4/image 111), max SUV 1.8 --13 x 10 mm nodule in the anterior left upper lobe (series 4/ image 136), max SUV 1.5 --5 mm right middle lobe nodule along the minor fissure (series 4/ image 143), max SUV 0.8 --6 mm nodule in the right lower lobe (series 4/ image 160), max SUV 1.1 Additional scattered subcentimeter nodules bilaterally. Mild centrilobular and paraseptal emphysematous changes, upper lobe predominant. No focal consolidation. Biapical pleural-parenchymal scarring. No pleural effusion or pneumothorax. Heart is normal in size. No pericardial effusion. Coronary atherosclerosis with percutaneous stent in the LAD. No hypermetabolic thoracic lymphadenopathy. Dominant 2.2 cm left thyroid nodule. Abdomen/Pelvis: No abnormal hypermetabolic activity within the liver, pancreas, adrenal glands, or spleen. Mild for sclerotic calcifications of the abdominal aorta and branch vessels. No hypermetabolic lymph nodes in the abdomen or pelvis. Skeleton: No focal hypermetabolic activity to suggest skeletal metastasis. Extremities: No hypermetabolic activity to suggest metastasis. IMPRESSION: No soft tissue hypermetabolism to correspond to the patient's known right shoulder/back melanoma. Scattered bilateral pulmonary nodules measuring up to 14 mm in the left lung apex, mildly FDG avid. While melanoma metastases are generally more hypermetabolic, the CT appearance remains suspicious for metastatic disease. Consider percutaneous sampling of a dominant left upper lobe nodule as clinically warranted. Electronically Signed   By: Julian Hy M.D.   On: 02/02/2016 10:22   Nm Sentinel Node Inj-no Rpt (melanoma)  01/24/2016  CLINICAL DATA: melanoma Sulfur colloid was injected by the nuclear medicine technologist for melanoma sentinel node.    Labs:  CBC:  Recent Labs  01/24/16 1215  02/16/16 0850  WBC  --  6.7  HGB 16.0 15.8  HCT 47.0 43.2  PLT  --  151    COAGS:  Recent Labs  02/16/16 0850  INR 1.12  APTT 27    BMP:  Recent Labs  01/19/16 1515 01/24/16 1215  NA 138 141  K 3.2* 3.8  CL 102  --   CO2 27  --   GLUCOSE 105* 106*  BUN 13  --   CALCIUM 9.4  --   CREATININE 0.86  --   GFRNONAA >60  --   GFRAA >60  --     LIVER FUNCTION TESTS: No results for input(s): BILITOT, AST, ALT, ALKPHOS, PROT, ALBUMIN in the last 8760 hours.  TUMOR MARKERS: No results for input(s): AFPTM, CEA, CA199, CHROMGRNA in the last 8760 hours.  Assessment and Plan:  CT guided biopsy of LUL nodule today.  Anterior nodule more approachable and less risky for biopsy compared to left apical nodule.  Risks and benefits discussed with the patient including, but not limited to bleeding, hemoptysis, respiratory failure requiring intubation, infection, pneumothorax requiring chest tube placement, stroke from air embolism or even death.  All  of the patient's questions were answered, patient is agreeable to proceed. Consent signed and in chart.   Electronically SignedAletta Edouard T 02/16/2016, 10:41 AM

## 2016-02-16 NOTE — Procedures (Signed)
Interventional Radiology Procedure Note  Procedure:  CT guided core biopsy of LUL lung nodule  Complications:  Small amount of pulmonary hemorrhage  Estimated Blood Loss: < 10 mL  13 mm anterior LUL nodule sampled via 17 G needle with 18 G cores x 3 yielding small tissue fragments. Mild amount of hemorrhage deep to nodule after biopsy.  No PTX by CT.  Plan:  2-3 hr recovery with CXR in 2 hours.  Venetia Night. Kathlene Cote, M.D Pager:  [phone]

## 2016-02-16 NOTE — Discharge Instructions (Signed)

## 2016-02-17 ENCOUNTER — Telehealth: Payer: Self-pay

## 2016-02-17 LAB — SURGICAL PATHOLOGY

## 2016-02-17 NOTE — Telephone Encounter (Signed)
Received a call from patient's wife, [name] requesting anxiety medication for her husband as she states he is extremely irritable. I explained that Dr. Genevive Bi does not prescribe this type of medication and that she will have to call PCP in order to obtain this type medication. She verbalizes understanding of this and states that she will give Dr. Netty Starring a call.

## 2016-02-20 DIAGNOSIS — F418 Other specified anxiety disorders: Secondary | ICD-10-CM | POA: Diagnosis not present

## 2016-02-23 ENCOUNTER — Inpatient Hospital Stay: Payer: PPO | Admitting: Cardiothoracic Surgery

## 2016-02-23 ENCOUNTER — Telehealth: Payer: Self-pay | Admitting: *Deleted

## 2016-02-23 ENCOUNTER — Ambulatory Visit: Payer: PPO | Admitting: Cardiothoracic Surgery

## 2016-02-23 NOTE — Telephone Encounter (Signed)
Patient called the office back to report that he would like to wait 3-6 months before proceeding with surgery. Dr. Bary Castilla notified accordingly.  This patient was encouraged to keep follow up appointment with Dr. Genevive Bi this afternoon and they can discuss surgery further.   We will leave patient's surgery as scheduled for now on 03-08-16. If after the patient meets with Dr. Genevive Bi this afternoon and they decide to postpone, patient will contact the office and surgery will be cancelled.

## 2016-02-23 NOTE — Telephone Encounter (Signed)
Message for patient to call the office.   Patient's surgery has been scheduled for 03-08-16 at [facility]. This patient will be asked to stop Plavix 7 days prior to procedure. It is okay for patient to continue 81 mg aspirin once daily.   We need to schedule patient for a pre-op visit on 02-28-16 if possible.

## 2016-02-24 NOTE — Telephone Encounter (Signed)
Please call patient on Monday.

## 2016-02-27 ENCOUNTER — Other Ambulatory Visit: Payer: Self-pay | Admitting: General Surgery

## 2016-02-27 ENCOUNTER — Telehealth: Payer: Self-pay | Admitting: *Deleted

## 2016-02-27 NOTE — H&P (Signed)
Expand All Collapse All   Patient ID: Glen Davis, male DOB: [birth year], 75 y.o. MRN: [id number]  Chief Complaint  Patient presents with  . New Evaluation    Lung Nodule    Referred By Dr. Bary Castilla Reason for Referral lung nodules  HPI Location, Quality, Duration, Severity, Timing, Context, Modifying Factors, Associated Signs and Symptoms.  Glen Davis is a 75 y.o. male. 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 As part of that evaluation he underwent a chest CT. The chest CT revealed multiple bilateral pulmonary nodules. The largest nodules in the left upper lobe measuring about 11 mm. The patient then underwent a PET scan and the PET scan showed uptake in several of these larger nodules although the smaller ones did not appear on the PET. The patient is a past smoker. He quit many years ago and smoked about one pack cigarettes a day for 10-15 years. Of note is that he also has coronary artery disease and is currently taking aspirin and Plavix for that. He was able to stop that prior to his shoulder surgery. He has approximately 10 alcoholic drinks per week. He denies any cardiopulmonary symptoms. He does not complain of any cough shortness of breath or fever or chills. Of note is that in 2004 and 2005 he did have several CT scans made for bilateral pulmonary nodules. The images themselves are archived and not available for review, however the reports are conflicting regarding the number and size of the nodules.    Past Medical History  Diagnosis Date  . Hypertension   . CAD (coronary artery disease)   . Cancer [facility])     left arm    Past Surgical History  Procedure Laterality Date  . Cardiac stents  2011    Angioplasty / Stenting Femoral-X2  . Arm surgery Left     arm  . Colonoscopy  04/01/12  . Excision melanoma  with sentinel lymph node biopsy Right 01/24/2016    Procedure: EXCISION MELANOMA Right Shoulder; Surgeon: Robert Bellow, MD; Location: [facility] ORS; Service: General; Laterality: Right;    Family History  Problem Relation Age of Onset  . Hodgkin's lymphoma Daughter 5    Social History Social History  Substance Use Topics  . Smoking status: Former Smoker    Types: Cigarettes    Start date: 01/18/1956    Quit date: 01/17/1969  . Smokeless tobacco: Not on file  . Alcohol Use: 0.0 oz/week    0 Standard drinks or equivalent per week     Comment: [facility]    Allergies  Allergen Reactions  . Lisinopril Swelling  . Other Itching    Nitroglycerin Patch.    Current Outpatient Prescriptions  Medication Sig Dispense Refill  . amLODipine (NORVASC) 5 MG tablet TAKE 1 TABLET (5 MG TOTAL) BY MOUTH ONCE DAILY-AM  1  . aspirin 81 MG tablet Take 81 mg by mouth daily.    Marland Kitchen atorvastatin (LIPITOR) 40 MG tablet TAKE 1 TABLET (40 MG TOTAL) BY MOUTH ONCE DAILY-AM  11  . hydrochlorothiazide (HYDRODIURIL) 12.5 MG tablet Take 12.5 mg by mouth daily.     Marland Kitchen losartan (COZAAR) 50 MG tablet Take 50 mg by mouth every morning.     . metoprolol succinate (TOPROL-XL) 50 MG 24 hr tablet Take 50 mg by mouth every morning.     . tamsulosin (FLOMAX) 0.4 MG CAPS capsule  Take 0.4 mg by mouth every morning.      No current facility-administered medications for this visit.      Review of Systems A complete review of systems was asked and was negative except for the following positive findings All review of systems were negative.  Blood pressure 147/90, pulse 57, temperature 95.6 F (35.3 C), temperature source Tympanic, resp. rate 18, height '6\' 2"'$  (1.88 m), weight 221 lb 14.3 oz (100.65 kg), SpO2 97 %.  Physical Exam CONSTITUTIONAL: Pleasant, well-developed, well-nourished, and in no acute  distress. EYES: Pupils equal and reactive to light, Sclera non-icteric EARS, NOSE, MOUTH AND THROAT: The oropharynx was clear. Dentition is good repair. Oral mucosa pink and moist. LYMPH NODES: Lymph nodes in the neck and axillae were normal RESPIRATORY: Lungs were clear. Normal respiratory effort without pathologic use of accessory muscles of respiration CARDIOVASCULAR: Heart was regular without murmurs. There were no carotid bruits. GI: The abdomen was soft, nontender, and nondistended. There were no palpable masses. There was no hepatosplenomegaly. There were normal bowel sounds in all quadrants. GU: Rectal deferred.  MUSCULOSKELETAL: Normal muscle strength and tone. No clubbing or cyanosis.  SKIN: There were multiple areas on the back which were keratotic.Marland Kitchen There were no nodules on palpation. NEUROLOGIC: Sensation is normal. Cranial nerves are grossly intact. PSYCH: Oriented to person, place and time. Mood and affect are normal.  There is a well healed surgical scar over the posterior right shoulder.  Data Reviewed CT scan and PET scan  I have personally reviewed the patient's imaging, laboratory findings and medical records.   Assessment    I have independently reviewed the patient's CT scan and PET scan. The nodule in the left upper lobe does appear to be amenable to percutaneous biopsy. I would recommend that at the present time. I did explain to him that surgical intervention may be necessary if the percutaneous biopsy does not reveal a specific diagnosis.    Plan    We will arrange for a CT-guided needle biopsy. We will stop his aspirin and Plavix at a minimum of 5 days prior to the biopsy. He'll come back to see me in 2 weeks to discuss those results.      Nestor Lewandowsky, MD 02/09/2016, 1:42 PM  CT-guided biopsy dated 02/16/2016 was nondiagnostic.  Options for management have been reviewed: Follow-up scan in 3-6 months versus thoracoscopy and biopsy. At  this time the patient desires to proceed to biopsy.  He'll discontinue his Plavix one week prior to the procedure.

## 2016-02-27 NOTE — Telephone Encounter (Signed)
Spoke with the patient today and he has decided to proceed with surgery after speaking with Dr. Genevive Bi.   Patient's surgery has been scheduled for 03-08-16. This patient has been asked to stop Plavix 7 days prior to procedure. It is okay for patient to continue 81 mg aspirin once daily.   This patient will come in for a pre-op visit with Dr. Bary Castilla on 03-01-16 at 3:45 pm.

## 2016-03-01 ENCOUNTER — Encounter: Payer: Self-pay | Admitting: *Deleted

## 2016-03-01 ENCOUNTER — Ambulatory Visit (INDEPENDENT_AMBULATORY_CARE_PROVIDER_SITE_OTHER): Payer: PPO | Admitting: General Surgery

## 2016-03-01 ENCOUNTER — Other Ambulatory Visit: Payer: PPO

## 2016-03-01 ENCOUNTER — Encounter: Payer: Self-pay | Admitting: General Surgery

## 2016-03-01 VITALS — BP 120/70 | HR 70 | Resp 14 | Ht 74.0 in | Wt 218.0 lb

## 2016-03-01 DIAGNOSIS — R918 Other nonspecific abnormal finding of lung field: Secondary | ICD-10-CM | POA: Diagnosis not present

## 2016-03-01 DIAGNOSIS — E876 Hypokalemia: Secondary | ICD-10-CM | POA: Diagnosis not present

## 2016-03-01 NOTE — Patient Instructions (Signed)
The patient is aware to call back for any questions or concerns.  

## 2016-03-01 NOTE — Pre-Procedure Instructions (Addendum)
multiple (stress and rest) (06/13/2015 1:45 PM) NM myocardial perfusion SPECT multiple (stress and rest) (06/13/2015 1:45 PM)  Impressions    1. Negative ETT  2. Normal left ventricular function  3. Normal wall mode  4. Mild inferior wall ischemia      NM myocardial perfusion SPECT multiple (stress and rest) (06/13/2015 1:45 PM)  Narrative  Procedure: Exercise Myocardial Perfusion Imaging  ONE day procedure    Indication: Coronary artery disease involving native coronary artery of   native heart without angina pectoris - Plan: NM myocardial perfusion SPECT   multiple (stress and rest), ECG stress test only    DOE (dyspnea on exertion) - Plan: NM myocardial perfusion SPECT multiple   (stress and rest), ECG stress test only  Ordering Physician:     Dr. Isaias Cowman      Clinical History:  75 y.o. year old male  Vitals: Height: 71 inWeight: 217 lb  Cardiac risk factors include:    Hyperlipidemia, PCI, HTN and CAD       Procedure:  The patient performed treadmill exercise using a Bruce protocol for 8:01   minutes. The exercise test was stopped due to SOB/fatigue.Blood pressure   response was hypertensive. The patient developed symptoms other than   fatigue during the procedure; specific symptoms included WHEEZING. PULSE   OXIMETRY OF 92% AT PEAK EXERCISE.    Rest HR: 53bpm  Rest BP: 130/3mHg  Max HR: 130bpm  Max BP: 188/1063mg  Mets: 10.10  % MAX HR: 89%    Stress Test Administered by: DIOswald HillockCMA    ECG Interpretation:  Rest ECJGG:EZMOQHinus rhythm, none  Stress ECUTM:LYYTKPinus rhythm,   Recovery ECTWS:FKCLEXinus rhythm  ECG Interpretation:negative, no ECG changes.      Administrations This Visit  technetium [id number] (CARDIOLITE) injection 12.51.70llicurie  Admin Date Action Dose Route Administered By         06/13/2015 Given by Other 12.01.74llicurie  Intravenous KelKingsley Callander CNMT          technetium Tc99m18mtamibi (CARDIOLITE) injection 33.394.49licurie  Admin Date Action Dose Route Administered By         06/13/2015 Given by Other [phone] Intravenous KellKingsley CallanderCNMT                Gated post-stress perfusion imaging was performed 30 minutes after stress.   Rest images were performed 30 minutes after injection.    Gated LV Analysis:   TID:    LVEF= 55%    FINDINGS:  Regional wall motion:reveals normal myocardial thickening and wall   motion.  The overall quality of the study is good.  Artifacts noted: no  Left ventricular cavity: normal.    Perfusion Analysis:SPECT images demonstrate small perfusion abnormality   of mild intensity is present in the inferior region on the stress images.       EKG Results - in this encounter   ECG stress test only (06/13/2015 8:56 AM) ECG stress test only (06/13/2015 8:56 AM)  Specimen Performing Laboratory   [facility] GE MUSE   Visit Diagnoses - in this encounter Name Value Range  LV Ejection Fraction (%) 55   Aortic Valve Stenosis Grade none   Aortic Valve Regurgitation Grade mild   Aortic Valve Max Velocity (m/s) 1.6 m/sec   Mitral Valve Stenosis Grade none   Mitral Valve Regurgitation Grade mild   Tricuspid Valve Regurgitation Grade mild   Tricuspid Valve Regurgitation Max  Velocity (m/s) 2.7 m/sec   Right Ventricle Systolic Pressure (mmHg) 42.7 mmHg   LV End Diastolic Diameter (cm) 5.5 cm  LV End Systolic Diameter (cm) 3.6 cm  LV Septum Wall Thickness (cm) 1.3 cm  LV Posterior Wall Thickness (cm) 1.2 cm  Left Atrium Diameter (cm) 4.3 cm   Result Narrative                     CARDIOLOGY DEPARTMENT                         VISHWA, DAIS                       [place] #:  [id number]         [address] [place], [place] [address]             Date: 06/13/2015 08:15 AM                                                                  Adult Male Age: 47 yrs                    ECHOCARDIOGRAM REPORT                         Outpatient           STUDY:CHEST WALL                       TAPE:           KC::KCWC            ECHO:Yes   DOPPLER:Yes                FILE:           MD1:           COLOR:Yes  CONTRAST:Yes     MACHINE:Philips       RV BIOPSY:No         3D:No   SOUND QLTY:Moderate          MEDIUM:None ___________________________________________________________________________________________           HISTORY:Chest pain           REASON:Assess, LV function       INDICATION:R06.09 (ICD-10-CM) - DOE (dyspnea on exertion) ___________________________________________________________________________________________  ECHOCARDIOGRAPHIC MEASUREMENTS 2D DIMENSIONS AORTA             Values      Normal Range      MAIN PA          Values      Normal Range           Annulus:  2.0 cm    [2.3 - 2.9]  PA Main:  nm*       [1.5 - 2.1]         Aorta Sin:  nm*       [3.1 - 3.7]       RIGHT VENTRICLE       ST Junction:  nm*       [2.6 - 3.2]                RV Base:  nm*       [ < 4.2]         Asc.Aorta:  nm*       [2.6 - 3.4]                 RV Mid:  nm*       [ < 3.5]  LEFT VENTRICLE                                        RV Length:  nm*       [ < 8.6]             LVIDd:  5.5 cm    [4.2 - 5.9]       INFERIOR VENA CAVA             LVIDs:  3.6 cm                              Max. IVC:  nm*       [ <= 2.1]                FS:  34.9 %    [> 25]                    Min. IVC:  nm*               SWT:  1.3 cm    [0.6 - 1.0]                   ------------------               PWT:  1.2 cm    [0.6 - 1.0]                   nm* - not measured  LEFT ATRIUM           LA Diam:  4.3 cm    [3.0 - 4.0]       LA A4C Area:  nm*       [ < 20]         LA Volume:  nm*       [18 -  58] ___________________________________________________________________________________________  ECHOCARDIOGRAPHIC DESCRIPTIONS  AORTIC ROOT         Size:Normal   Dissection:INDETERM FOR DISSECTION  AORTIC VALVE     Leaflets:Tricuspid         Morphology:MILDLY THICKENED     Mobility:Fully mobile  LEFT VENTRICLE         Size:Normal              Anterior:Normal  Contraction:Normal               Lateral:Normal   Closest EF:>55% (Estimated)      Septal:Normal    LV Masses:No Masses  Apical:Normal          WUJ:WJXB LVH            Inferior:Normal                                 Posterior:Normal Dias.FxClass:N/A  MITRAL VALVE     Leaflets:Normal              Mobility:Fully mobile   Morphology:Normal  LEFT ATRIUM         Size:MILDLY ENLARGED    LA Masses:No masses  MAIN PA         Size:Normal  PULMONIC VALVE     Leaflets:N/A               Morphology:Normal     Mobility:Fully mobile  RIGHT VENTRICLE    RV Masses:No Masses               Size:Normal    Free Wall:Normal           Contraction:Normal  TRICUSPID VALVE     Leaflets:Normal              Mobility:Fully mobile   Morphology:Normal  RIGHT ATRIUM         Size:Normal              RA Other:None      RA Mass:No masses  PERICARDIUM        Fluid:No effusion  INFERIOR VENACAVA         Size:Normal Normal respiratory collapse  ____________________________________________________________________  DOPPLER ECHO and OTHER SPECIAL PROCEDURES    Aortic:MILD AR                       No AS           162.0 cm/sec peak vel         10.5 mmHg peak grad     Mitral:MILD MR                       No MS                                         2.8 cm^2 by DOPPLER           MV Inflow E Vel=79.6 cm/sec   MV Annulus E'Vel=6.3 cm/sec           E/E'Ratio=12.6  Tricuspid:MILD TR                       No TS           268.5 cm/sec peak TR vel      38.8 mmHg peak RV pressure  Pulmonary:MILD PR                       No PS            73.5 cm/sec peak vel          2.2 mmHg peak grad    ___________________________________________________________________________________________ INTERPRETATION NORMAL LEFT VENTRICULAR SYSTOLIC FUNCTION   WITH MILD LVH MILD VALVULAR REGURGITATION (See above) NO VALVULAR STENOSIS MILD PHTN   ___________________________________________________________________________________________ Electronically signed by: MD Miquel Dunn on 06/15/2015 04:42 PM             Performed By:  Roar, Jonette Mate, RDCS       Ordering Physician: Isaias Cowman  ___________________________________________________________________________________________   Status Results Details   Encounter Summary  2015 September Name Value Range  LV Ejection Fraction (%) 55   Aortic Valve Stenosis Grade none   Aortic Valve Regurgitation Grade mild   Aortic Valve Max Velocity (m/s) 1.6 m/sec   Mitral Valve Stenosis Grade none   Mitral Valve Regurgitation Grade mild   Tricuspid Valve Regurgitation Grade mild   Tricuspid Valve Regurgitation Max Velocity (m/s) 2.7 m/sec   Right Ventricle Systolic Pressure (mmHg) 27.2 mmHg   LV End Diastolic Diameter (cm) 5.5 cm  LV End Systolic Diameter (cm) 3.6 cm  LV Septum Wall Thickness (cm) 1.3 cm  LV Posterior Wall Thickness (cm) 1.2 cm  Left Atrium Diameter (cm) 4.3 cm   Result Narrative                     CARDIOLOGY DEPARTMENT                         [place], Victor #: [id number]         [address] [place], [place] [address]             Date: 06/13/2015 08:15 AM                                                                  Adult Male Age: 57 yrs                    ECHOCARDIOGRAM REPORT                         Outpatient           STUDY:CHEST WALL                       TAPE:           KC::KCWC            ECHO:Yes    DOPPLER:Yes                FILE:           MD1:           COLOR:Yes  CONTRAST:Yes     MACHINE:Philips       RV BIOPSY:No         3D:No   SOUND QLTY:Moderate          MEDIUM:None ___________________________________________________________________________________________           HISTORY:Chest pain           REASON:Assess,  LV function       INDICATION:R06.09 (ICD-10-CM) - DOE (dyspnea on exertion) ___________________________________________________________________________________________  ECHOCARDIOGRAPHIC MEASUREMENTS 2D DIMENSIONS AORTA             Values      Normal Range      MAIN PA          Values      Normal Range           Annulus:  2.0 cm    [2.3 - 2.9]                PA Main:  nm*       [1.5 - 2.1]         Aorta Sin:  nm*       [3.1 - 3.7]       RIGHT VENTRICLE       ST Junction:  nm*       [2.6 - 3.2]                RV Base:  nm*       [ < 4.2]         Asc.Aorta:  nm*       [2.6 - 3.4]                 RV Mid:  nm*       [ < 3.5]  LEFT VENTRICLE                                        RV Length:  nm*       [ < 8.6]             LVIDd:  5.5 cm    [4.2 - 5.9]       INFERIOR VENA CAVA             LVIDs:  3.6 cm                              Max. IVC:  nm*       [ <= 2.1]                FS:  34.9 %    [> 25]                    Min. IVC:  nm*               SWT:  1.3 cm    [0.6 - 1.0]                   ------------------               PWT:  1.2 cm    [0.6 - 1.0]                   nm* - not measured  LEFT ATRIUM           LA Diam:  4.3 cm    [3.0 - 4.0]       LA A4C Area:  nm*       [ < 20]         LA Volume:  nm*       [18 - 58] ___________________________________________________________________________________________  ECHOCARDIOGRAPHIC DESCRIPTIONS  AORTIC ROOT         Size:Normal  Dissection:INDETERM FOR DISSECTION  AORTIC VALVE     Leaflets:Tricuspid         Morphology:MILDLY THICKENED     Mobility:Fully mobile  LEFT VENTRICLE         Size:Normal               Anterior:Normal  Contraction:Normal               Lateral:Normal   Closest EF:>55% (Estimated)      Septal:Normal    LV Masses:No Masses             Apical:Normal          LJQ:GBEE LVH            Inferior:Normal                                 Posterior:Normal Dias.FxClass:N/A  MITRAL VALVE     Leaflets:Normal              Mobility:Fully mobile   Morphology:Normal  LEFT ATRIUM         Size:MILDLY ENLARGED    LA Masses:No masses  MAIN PA         Size:Normal  PULMONIC VALVE     Leaflets:N/A               Morphology:Normal     Mobility:Fully mobile  RIGHT VENTRICLE    RV Masses:No Masses               Size:Normal    Free Wall:Normal           Contraction:Normal  TRICUSPID VALVE     Leaflets:Normal              Mobility:Fully mobile   Morphology:Normal  RIGHT ATRIUM         Size:Normal              RA Other:None      RA Mass:No masses  PERICARDIUM        Fluid:No effusion  INFERIOR VENACAVA         Size:Normal Normal respiratory collapse  ____________________________________________________________________  DOPPLER ECHO and OTHER SPECIAL PROCEDURES    Aortic:MILD AR                       No AS           162.0 cm/sec peak vel         10.5 mmHg peak grad     Mitral:MILD MR                       No MS                                         2.8 cm^2 by DOPPLER           MV Inflow E Vel=79.6 cm/sec   MV Annulus E'Vel=6.3 cm/sec           E/E'Ratio=12.6  Tricuspid:MILD TR                       No TS           268.5 cm/sec peak TR vel      38.8 mmHg peak RV pressure  Pulmonary:MILD PR  No PS           73.5 cm/sec peak vel          2.2 mmHg peak grad    ___________________________________________________________________________________________ INTERPRETATION NORMAL LEFT VENTRICULAR SYSTOLIC FUNCTION   WITH MILD LVH MILD VALVULAR REGURGITATION (See above) NO VALVULAR STENOSIS MILD  PHTN   ___________________________________________________________________________________________ Electronically signed by: MD Miquel Dunn on 06/15/2015 04:42 PM             Performed By: Maurilio Lovely, [place]       Ordering Physician: Isaias Cowman  ___________________________________________________________________________________________   Status Results Details   Encounter Summary  2015 September  panel  Status: Finalresult Visible to patient:  Not Released Nextappt: Today at 09:00 AM in Pre-Admission Testing [facility] PAT 3)            Newer results are available. Click to view them now.        Ref Range 42moago    Sodium 135 - 145 mmol/L 138   Potassium 3.5 - 5.1 mmol/L 3.2 (L)   Chloride 101 - 111 mmol/L 102   CO2 22 - 32 mmol/L 27   Glucose, Bld 65 - 99 mg/dL 105 (H)   BUN 6 - 20 mg/dL 13   Creatinine, Ser 0.61 - 1.24 mg/dL 0.86   Calcium 8.9 - 10.3 mg/dL 9.4   GFR calc non Af Amer >60 mL/min >60   GFR calc Af Amer >60 mL/min >60   Comments: (NOTE)  The eGFR has been calculated using the CKD EPI equation.  This calculation has not been validated in all clinical situations.  eGFR's persistently <60 mL/min signify possible Chronic Kidney  Disease.     Anion gap 5 - 15  9   Resulting Agency [facility]       Specimen Collected: 01/19/16 3:15 PM Last Resulted: 01/19/16 4:37 PM                         Reviewed by List     JRobert Bellow MD on 01/20/2016 9:11 PM     Encounter     View Encounter      Result Information      Status    Abnormal Final result (01/19/2016 4:37 PM)    Provider Status: Reviewed        Lab Information     [id number]         Order-Level Documents:     There are no order-level documents.     View SmartLink Info     Basic metabolic panel (Order ##[id number] on 32[birth year]   Basic metabolic panel (Order [id number]  Lab  Order: [id number]   Released By: HMelanee Left RN (auto-released)  Authorizing: MGunnar Bulla MD   Date: 01/19/2016  [facility]       Order Information  panel  Status: Finalresult Visible to patient:  Not Released Nextappt: Today at 09:00 AM in [facility]Ad[facility]            Newer results are available. Click to view them now.        Ref Range 151mogo    Sodium 135 - 145 mmol/L 138   Potassium 3.5 - 5.1 mmol/L 3.2 (L)   Chloride 101 - 111 mmol/L 102   CO2 22 - 32 mmol/L 27   Glucose, Bld 65 - 99 mg/dL 105 (H)   BUN 6 -  20 mg/dL 13   Creatinine, Ser 0.61 - 1.24 mg/dL 0.86   Calcium 8.9 - 10.3 mg/dL 9.4   GFR calc non Af Amer >60 mL/min >60   GFR calc Af Amer >60 mL/min >60   Comments: (NOTE)  The eGFR has been calculated using the CKD EPI equation.  This calculation has not been validated in all clinical situations.  eGFR's persistently <60 mL/min signify possible Chronic Kidney  Disease.     Anion gap 5 - 15  9   Resulting Agency [facility]       Specimen Collected: 01/19/16 3:15 PM Last Resulted: 01/19/16 4:37 PM                         Reviewed by List     Robert Bellow, MD on 01/20/2016 9:11 PM     Encounter     View Encounter      Result Information      Status    Abnormal Final result (01/19/2016 4:37 PM)    Provider Status: Reviewed        Lab Information     [id number]          Order-Level Documents:     There are no order-level documents.     View SmartLink Info     Basic metabolic panel (Order #[id number]) on 0/5/39    Basic metabolic panel (Order [id number])  Lab  Order: [id number]   Released By: Melanee Left, RN (auto-released)  Authorizing: Gunnar Bulla, MD   Date: 01/19/2016  Department: [place] PRE ADMISSION TESTING       Order Information

## 2016-03-01 NOTE — Progress Notes (Signed)
Patient ID: Glen Davis, male   DOB: [birth year], 75 y.o.   MRN: [id number]  Chief Complaint  Patient presents with  . Pre-op Exam    HPI Glen Davis is a 75 y.o. male.  Here today for pre op left thoracoscopy on 03-08-16 for evaluation of pulmonary nodules appreciated on chest x-ray then CT. CT-guided biopsy was nondiagnostic.  The patient otherwise evaluation by Glen Davis, M.D. from thoracic surgery.    HPI  Past Medical History  Diagnosis Date  . Hypertension   . CAD (coronary artery disease)   . Cancer [facility]     left arm    Past Surgical History  Procedure Laterality Date  . Cardiac stents  2011    Angioplasty / Stenting Femoral-X2  . Arm surgery Left     arm  . Colonoscopy  04/01/12  . Excision melanoma with sentinel lymph node biopsy Right 01/24/2016    Procedure: EXCISION MELANOMA Right Shoulder;  Surgeon: Robert Bellow, MD;  Location: [facility] ORS;  Service: General;  Laterality: Right;  . Coronary angioplasty      Family History  Problem Relation Age of Onset  . Hodgkin's lymphoma Daughter 5    Social History Social History  Substance Use Topics  . Smoking status: Former Smoker    Types: Cigarettes    Start date: 01/18/1956    Quit date: 01/17/1969  . Smokeless tobacco: None  . Alcohol Use: 0.0 oz/week    0 Standard drinks or equivalent per week     Comment: BEER [facility]    Allergies  Allergen Reactions  . Lisinopril Swelling  . Other Itching    Nitroglycerin Patch.    Current Outpatient Prescriptions  Medication Sig Dispense Refill  . amLODipine (NORVASC) 5 MG tablet TAKE 1 TABLET (5 MG TOTAL) BY MOUTH ONCE DAILY-AM  1  . aspirin 81 MG tablet Take 81 mg by mouth daily.    Marland Kitchen atorvastatin (LIPITOR) 40 MG tablet TAKE 1 TABLET (40 MG TOTAL) BY MOUTH ONCE DAILY-AM  11  . clopidogrel (PLAVIX) 75 MG tablet Take 75 mg by mouth daily.    . hydrochlorothiazide (HYDRODIURIL) 12.5 MG tablet Take 12.5 mg by mouth daily.     Marland Kitchen losartan (COZAAR) 50 MG tablet  Take 50 mg by mouth every morning.     . metoprolol succinate (TOPROL-XL) 50 MG 24 hr tablet Take 50 mg by mouth every morning.     . tamsulosin (FLOMAX) 0.4 MG CAPS capsule Take 0.4 mg by mouth every morning.     . potassium chloride 20 MEQ TBCR Take 2 tablets by mouth twice a day until surgery 20 tablet 0   No current facility-administered medications for this visit.    Review of Systems Review of Systems  Constitutional: Negative.   Respiratory: Negative.   Cardiovascular: Negative.     Blood pressure 120/70, pulse 70, resp. rate 14, height '6\' 2"'$  (1.88 m), weight 218 lb (98.884 kg).  Physical Exam Physical Exam  Constitutional: He is oriented to person, place, and time. He appears well-developed and well-nourished.  HENT:  Mouth/Throat: Oropharynx is clear and moist.  Eyes: Conjunctivae are normal. No scleral icterus.  Neck: Neck supple.  Cardiovascular: Normal rate, regular rhythm and normal heart sounds.   Pulmonary/Chest: Effort normal and breath sounds normal.  Neurological: He is alert and oriented to person, place, and time.  Skin: Skin is warm and dry.  Psychiatric: His behavior is normal.    Data Reviewed IMPRESSION: 1. Multiple  pulmonary nodules scattered throughout the lungs bilaterally, concerning for metastatic disease. The largest of these is in the left upper lobe with a mean diameter of 11.5 mm. Further evaluation with PET-CT is suggested. 2. Mild diffuse bronchial wall thickening with moderate centrilobular emphysema; imaging findings suggestive of underlying COPD. 3. Atherosclerosis, including 2 vessel coronary artery disease. Status post PTCI to the LAD.   Electronically Signed  By: Vinnie Langton M.D.  On: 01/23/2016 16:08  DIAGNOSIS:  A. LUNG, LEFT UPPER LOBE; CT-GUIDED BIOPSY:  - LUNG TISSUE WITHOUT SPECIFIC PATHOLOGIC CHANGES, IN A SCANT SPECIMEN.  - NO NEOPLASM IS IDENTIFIED IN THIS SAMPLE (ADDITIONAL DEEPER SECTIONS  WERE REVIEWED).     GROSS DESCRIPTION:  A. Labeled: left lung  Tissue fragment(s): multiple minute  Size: 0.5 x 0.1 x 0.1 cm  Description: aggregate of soft tan pink fragments and white friable  material, wrapped in lens paper marked orange and submitted a mesh bag   Entirely submitted in one cassette(s).    Final Diagnosis performed by Glen Lemma, MD. Electronically signed  02/17/2016 3:00:16PM   Assessment    Lung nodules, suspicion for metastatic disease based on PET scan.  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  Thyroid nodule: FNA [facility]-2.    Plan          Discussed risk and benefits of surgery. The patient is aware to call back for any questions or concerns.   PCP:  Glen Davis This information has been scribed by Karie Fetch RN, BSN,BC.  Robert Bellow 03/02/2016, 6:26 PM

## 2016-03-01 NOTE — Patient Instructions (Signed)
  Your procedure is scheduled on: 03/08/16 Report to Day Surgery. [facility]OND FLOOR To find out your arrival time please call [phone] between 1PM - 3PM on4/19/17  Remember: Instructions that are not followed completely may result in serious medical risk, up to and including death, or upon the discretion of your surgeon and anesthesiologist your surgery may need to be rescheduled.    __X__ 1. Do not eat food or drink liquids after midnight. No gum chewing or hard candies.     __X__ 2. No Alcohol for 24 hours before or after surgery.   ____ 3. Bring all medications with you on the day of surgery if instructed.    __X__ 4. Notify your doctor if there is any change in your medical condition     (cold, fever, infections).     Do not wear jewelry, make-up, hairpins, clips or nail polish.  Do not wear lotions, powders, or perfumes. You may wear deodorant.  Do not shave 48 hours prior to surgery. Men may shave face and neck.  Do not bring valuables to the hospital.    [facility] is not responsible for any belongings or valuables.               Contacts, dentures or bridgework may not be worn into surgery.  Leave your suitcase in the car. After surgery it may be brought to your room.  For patients admitted to the hospital, discharge time is determined by your                treatment team.   Patients discharged the day of surgery will not be allowed to drive home.   Please read over the following fact sheets that you were given:   Surgical Site Infection Prevention   ____ Take these medicines the morning of surgery with A SIP OF WATER:    1. AMLODIPINE  2. ATORVASTATIN  3. LOSARTAN  4. METOPROLOL  5.FLOMAX  6.  ____ Fleet Enema (as directed)   X____ Use CHG Soap as directed  ____ Use inhalers on the day of surgery  ____ Stop metformin 2 days prior to surgery    ____ Take 1/2 of usual insulin dose the night before surgery and none on the morning of surgery.    __X__ Stop Coumadin/Plavix/aspirin on  STOP ASPIRIN AND PLAVIX 5 DAYS PREOP ____ Stop Anti-inflammatories on   ____ Stop supplements until after surgery.    ____ Bring C-Pap to the hospital.

## 2016-03-02 ENCOUNTER — Encounter
Admission: RE | Admit: 2016-03-02 | Discharge: 2016-03-02 | Disposition: A | Discharge status: Home / Self Care | Payer: PPO | Source: Ambulatory Visit | Attending: General Surgery | Admitting: General Surgery

## 2016-03-02 ENCOUNTER — Telehealth: Payer: Self-pay | Admitting: General Surgery

## 2016-03-02 DIAGNOSIS — I2510 Atherosclerotic heart disease of native coronary artery without angina pectoris: Secondary | ICD-10-CM | POA: Diagnosis not present

## 2016-03-02 DIAGNOSIS — R918 Other nonspecific abnormal finding of lung field: Secondary | ICD-10-CM | POA: Diagnosis not present

## 2016-03-02 DIAGNOSIS — Z01812 Encounter for preprocedural laboratory examination: Secondary | ICD-10-CM | POA: Diagnosis not present

## 2016-03-02 DIAGNOSIS — R911 Solitary pulmonary nodule: Secondary | ICD-10-CM | POA: Diagnosis not present

## 2016-03-02 DIAGNOSIS — C439 Malignant melanoma of skin, unspecified: Secondary | ICD-10-CM | POA: Diagnosis not present

## 2016-03-02 DIAGNOSIS — K660 Peritoneal adhesions (postprocedural) (postinfection): Secondary | ICD-10-CM | POA: Diagnosis not present

## 2016-03-02 DIAGNOSIS — I10 Essential (primary) hypertension: Secondary | ICD-10-CM | POA: Diagnosis not present

## 2016-03-02 DIAGNOSIS — C7802 Secondary malignant neoplasm of left lung: Secondary | ICD-10-CM | POA: Diagnosis not present

## 2016-03-02 DIAGNOSIS — J9811 Atelectasis: Secondary | ICD-10-CM | POA: Diagnosis not present

## 2016-03-02 DIAGNOSIS — Z4682 Encounter for fitting and adjustment of non-vascular catheter: Secondary | ICD-10-CM | POA: Diagnosis not present

## 2016-03-02 LAB — ABO/RH: ABO/RH(D): O POS

## 2016-03-02 LAB — TYPE AND SCREEN
ABO/RH(D): O POS
Antibody Screen: NEGATIVE

## 2016-03-02 LAB — SURGICAL PCR SCREEN

## 2016-03-02 LAB — POTASSIUM: POTASSIUM: 3.1 mmol/L — AB (ref 3.5–5.1)

## 2016-03-02 MED ORDER — POTASSIUM CHLORIDE ER 20 MEQ PO TBCR: EXTENDED_RELEASE_TABLET | ORAL | Status: DC

## 2016-03-02 NOTE — Telephone Encounter (Signed)
Patient's wife was notified that his potassium was low, and will need to have him take supplementation prior to his upcoming thoracoscopy. a prescription for KCl tablets, 10 mEq with the inscription 2 tablets by mouth twice a day was sent to the [facility] pharmacy.

## 2016-03-05 ENCOUNTER — Encounter
Admission: RE | Admit: 2016-03-05 | Discharge: 2016-03-05 | Disposition: A | Discharge status: Home / Self Care | Payer: PPO | Source: Ambulatory Visit | Attending: General Surgery | Admitting: General Surgery

## 2016-03-05 NOTE — Pre-Procedure Instructions (Signed)
MRSA SWAB CAME BACK AS INVALID PER LAB.  CALLED PT AND INFORMED HIM OF THIS. PT COMING TO GET MRSA SWAB REDONE TODAY.

## 2016-03-06 LAB — SURGICAL PCR SCREEN
MRSA, PCR: NEGATIVE
STAPHYLOCOCCUS AUREUS: NEGATIVE

## 2016-03-08 ENCOUNTER — Encounter: Payer: Self-pay | Admitting: Anesthesiology

## 2016-03-08 ENCOUNTER — Inpatient Hospital Stay: Payer: PPO | Admitting: Certified Registered Nurse Anesthetist

## 2016-03-08 ENCOUNTER — Encounter
Admission: RE | Disposition: A | Discharge status: Home / Self Care | Payer: Self-pay | Source: Ambulatory Visit | Attending: General Surgery

## 2016-03-08 ENCOUNTER — Inpatient Hospital Stay
Admission: RE | Admit: 2016-03-08 | Discharge: 2016-03-10 | DRG: 168 | Disposition: A | Discharge status: Home / Self Care | Payer: PPO | Source: Ambulatory Visit | Attending: General Surgery | Admitting: General Surgery

## 2016-03-08 ENCOUNTER — Inpatient Hospital Stay: Payer: PPO

## 2016-03-08 DIAGNOSIS — J9811 Atelectasis: Secondary | ICD-10-CM | POA: Diagnosis not present

## 2016-03-08 DIAGNOSIS — R911 Solitary pulmonary nodule: Secondary | ICD-10-CM | POA: Diagnosis not present

## 2016-03-08 DIAGNOSIS — C7802 Secondary malignant neoplasm of left lung: Principal | ICD-10-CM | POA: Diagnosis present

## 2016-03-08 DIAGNOSIS — I10 Essential (primary) hypertension: Secondary | ICD-10-CM | POA: Diagnosis not present

## 2016-03-08 DIAGNOSIS — C439 Malignant melanoma of skin, unspecified: Secondary | ICD-10-CM | POA: Diagnosis present

## 2016-03-08 DIAGNOSIS — K660 Peritoneal adhesions (postprocedural) (postinfection): Secondary | ICD-10-CM | POA: Diagnosis not present

## 2016-03-08 DIAGNOSIS — Z4682 Encounter for fitting and adjustment of non-vascular catheter: Secondary | ICD-10-CM

## 2016-03-08 DIAGNOSIS — R918 Other nonspecific abnormal finding of lung field: Secondary | ICD-10-CM | POA: Diagnosis not present

## 2016-03-08 DIAGNOSIS — Z9889 Other specified postprocedural states: Secondary | ICD-10-CM

## 2016-03-08 DIAGNOSIS — I2510 Atherosclerotic heart disease of native coronary artery without angina pectoris: Secondary | ICD-10-CM | POA: Diagnosis not present

## 2016-03-08 HISTORY — PX: VIDEO ASSISTED THORACOSCOPY (VATS)/THOROCOTOMY: SHX6173

## 2016-03-08 LAB — POCT I-STAT 4, (NA,K, GLUC, HGB,HCT)
Glucose, Bld: 132 mg/dL — ABNORMAL HIGH (ref 65–99)
HCT: 47 % (ref 39.0–52.0)
HEMOGLOBIN: 16 g/dL (ref 13.0–17.0)
POTASSIUM: 4.2 mmol/L (ref 3.5–5.1)
SODIUM: 141 mmol/L (ref 135–145)

## 2016-03-08 SURGERY — VIDEO ASSISTED THORACOSCOPY (VATS)/THOROCOTOMY
Anesthesia: General | Laterality: Left | Wound class: Clean Contaminated

## 2016-03-08 MED ORDER — ACETAMINOPHEN 10 MG/ML IV SOLN
INTRAVENOUS | Status: DC | PRN
  Administered 2016-03-08: 1000 mg via INTRAVENOUS

## 2016-03-08 MED ORDER — FAMOTIDINE 20 MG PO TABS
ORAL_TABLET | ORAL | Status: AC
  Administered 2016-03-08: 20 mg via ORAL
  Filled 2016-03-08: qty 1

## 2016-03-08 MED ORDER — FAMOTIDINE 20 MG PO TABS
20.0000 mg | ORAL_TABLET | Freq: Once | ORAL | Status: AC
  Administered 2016-03-08: 20 mg via ORAL

## 2016-03-08 MED ORDER — CEFAZOLIN SODIUM 1 G IJ SOLR
INTRAMUSCULAR | Status: DC | PRN
  Administered 2016-03-08: 2 g via INTRAMUSCULAR

## 2016-03-08 MED ORDER — HYDROMORPHONE HCL 1 MG/ML IJ SOLN
INTRAMUSCULAR | Status: AC
  Administered 2016-03-08: 13:00:00
  Filled 2016-03-08: qty 1

## 2016-03-08 MED ORDER — NEOSTIGMINE METHYLSULFATE 10 MG/10ML IV SOLN
INTRAVENOUS | Status: DC | PRN
  Administered 2016-03-08: 5 mg via INTRAVENOUS

## 2016-03-08 MED ORDER — BUPIVACAINE-EPINEPHRINE (PF) 0.5% -1:200000 IJ SOLN
INTRAMUSCULAR | Status: AC
  Filled 2016-03-08: qty 30

## 2016-03-08 MED ORDER — PHENYLEPHRINE HCL 10 MG/ML IJ SOLN
INTRAMUSCULAR | Status: DC | PRN
  Administered 2016-03-08: 200 ug via INTRAVENOUS
  Administered 2016-03-08: 50 ug via INTRAVENOUS
  Administered 2016-03-08: 200 ug via INTRAVENOUS

## 2016-03-08 MED ORDER — HYDROCODONE-ACETAMINOPHEN 5-325 MG PO TABS
1.0000 | ORAL_TABLET | ORAL | Status: DC | PRN
  Administered 2016-03-08 – 2016-03-10 (×4): 2 via ORAL
  Filled 2016-03-08 (×4): qty 2

## 2016-03-08 MED ORDER — ACETAMINOPHEN 325 MG PO TABS: 650.0000 mg | ORAL_TABLET | Freq: Four times a day (QID) | ORAL | Status: DC | PRN

## 2016-03-08 MED ORDER — ASPIRIN 81 MG PO CHEW
81.0000 mg | CHEWABLE_TABLET | Freq: Every day | ORAL | Status: DC
  Administered 2016-03-09 – 2016-03-10 (×2): 81 mg via ORAL
  Filled 2016-03-08 (×2): qty 1

## 2016-03-08 MED ORDER — HYDROCHLOROTHIAZIDE 25 MG PO TABS
12.5000 mg | ORAL_TABLET | Freq: Every day | ORAL | Status: DC
  Administered 2016-03-08 – 2016-03-10 (×3): 12.5 mg via ORAL
  Filled 2016-03-08 (×3): qty 1

## 2016-03-08 MED ORDER — MORPHINE SULFATE (PF) 2 MG/ML IV SOLN
2.0000 mg | INTRAVENOUS | Status: DC | PRN
  Administered 2016-03-08 – 2016-03-09 (×2): 2 mg via INTRAVENOUS
  Filled 2016-03-08 (×2): qty 1

## 2016-03-08 MED ORDER — FENTANYL CITRATE (PF) 100 MCG/2ML IJ SOLN
INTRAMUSCULAR | Status: DC | PRN
  Administered 2016-03-08 (×3): 50 ug via INTRAVENOUS
  Administered 2016-03-08: 150 ug via INTRAVENOUS

## 2016-03-08 MED ORDER — MINERAL OIL LIGHT 100 % EX OIL
TOPICAL_OIL | CUTANEOUS | Status: AC
  Filled 2016-03-08: qty 25

## 2016-03-08 MED ORDER — ONDANSETRON HCL 4 MG/2ML IJ SOLN
4.0000 mg | Freq: Four times a day (QID) | INTRAMUSCULAR | Status: DC | PRN
  Administered 2016-03-08: 4 mg via INTRAVENOUS
  Filled 2016-03-08: qty 2

## 2016-03-08 MED ORDER — SUCCINYLCHOLINE CHLORIDE 20 MG/ML IJ SOLN
INTRAMUSCULAR | Status: DC | PRN
  Administered 2016-03-08: 100 mg via INTRAVENOUS

## 2016-03-08 MED ORDER — LIDOCAINE HCL (CARDIAC) 20 MG/ML IV SOLN
INTRAVENOUS | Status: DC | PRN
  Administered 2016-03-08: 100 mg via INTRAVENOUS

## 2016-03-08 MED ORDER — HYDROMORPHONE HCL 1 MG/ML IJ SOLN
0.2500 mg | INTRAMUSCULAR | Status: DC | PRN
  Administered 2016-03-08 (×2): 0.5 mg via INTRAVENOUS

## 2016-03-08 MED ORDER — ONDANSETRON HCL 4 MG/2ML IJ SOLN: 4.0000 mg | Freq: Once | INTRAMUSCULAR | Status: DC | PRN

## 2016-03-08 MED ORDER — LACTATED RINGERS IV SOLN
INTRAVENOUS | Status: DC
  Administered 2016-03-08 – 2016-03-10 (×3): via INTRAVENOUS

## 2016-03-08 MED ORDER — FENTANYL CITRATE (PF) 100 MCG/2ML IJ SOLN
INTRAMUSCULAR | Status: AC
  Administered 2016-03-08: 13:00:00
  Filled 2016-03-08: qty 2

## 2016-03-08 MED ORDER — LACTATED RINGERS IV SOLN
INTRAVENOUS | Status: DC
  Administered 2016-03-08 (×3): via INTRAVENOUS

## 2016-03-08 MED ORDER — ACETAMINOPHEN 650 MG RE SUPP: 650.0000 mg | Freq: Four times a day (QID) | RECTAL | Status: DC | PRN

## 2016-03-08 MED ORDER — BUPIVACAINE HCL (PF) 0.25 % IJ SOLN
INTRAMUSCULAR | Status: AC
  Filled 2016-03-08: qty 30

## 2016-03-08 MED ORDER — TAMSULOSIN HCL 0.4 MG PO CAPS
0.4000 mg | ORAL_CAPSULE | ORAL | Status: DC
  Administered 2016-03-09 – 2016-03-10 (×2): 0.4 mg via ORAL
  Filled 2016-03-08 (×2): qty 1

## 2016-03-08 MED ORDER — EPHEDRINE SULFATE 50 MG/ML IJ SOLN
INTRAMUSCULAR | Status: DC | PRN
  Administered 2016-03-08: 5 mg via INTRAVENOUS
  Administered 2016-03-08 (×2): 10 mg via INTRAVENOUS

## 2016-03-08 MED ORDER — FENTANYL CITRATE (PF) 100 MCG/2ML IJ SOLN
INTRAMUSCULAR | Status: AC
  Administered 2016-03-08: 12:00:00
  Filled 2016-03-08: qty 2

## 2016-03-08 MED ORDER — SODIUM CHLORIDE FLUSH 0.9 % IV SOLN
INTRAVENOUS | Status: AC
  Filled 2016-03-08: qty 10

## 2016-03-08 MED ORDER — KETOROLAC TROMETHAMINE 30 MG/ML IJ SOLN
15.0000 mg | Freq: Three times a day (TID) | INTRAMUSCULAR | Status: DC
  Administered 2016-03-08 – 2016-03-10 (×6): 15 mg via INTRAVENOUS
  Filled 2016-03-08 (×6): qty 1

## 2016-03-08 MED ORDER — GLYCOPYRROLATE 0.2 MG/ML IJ SOLN
INTRAMUSCULAR | Status: DC | PRN
  Administered 2016-03-08: .8 mg via INTRAVENOUS
  Administered 2016-03-08: 0.2 mg via INTRAVENOUS

## 2016-03-08 MED ORDER — METOPROLOL SUCCINATE ER 50 MG PO TB24
50.0000 mg | ORAL_TABLET | ORAL | Status: DC
  Administered 2016-03-09: 50 mg via ORAL
  Filled 2016-03-08 (×2): qty 1

## 2016-03-08 MED ORDER — KETOROLAC TROMETHAMINE 30 MG/ML IJ SOLN
INTRAMUSCULAR | Status: DC | PRN
  Administered 2016-03-08: 15 mg via INTRAVENOUS

## 2016-03-08 MED ORDER — BUPIVACAINE-EPINEPHRINE (PF) 0.5% -1:200000 IJ SOLN
INTRAMUSCULAR | Status: DC | PRN
  Administered 2016-03-08: 30 mL via PERINEURAL

## 2016-03-08 MED ORDER — ENOXAPARIN SODIUM 40 MG/0.4ML ~~LOC~~ SOLN
40.0000 mg | SUBCUTANEOUS | Status: DC
  Administered 2016-03-09: 40 mg via SUBCUTANEOUS
  Filled 2016-03-08: qty 0.4

## 2016-03-08 MED ORDER — TALC 5 G PL SUSR
INTRAPLEURAL | Status: AC
  Filled 2016-03-08: qty 5

## 2016-03-08 MED ORDER — FENTANYL CITRATE (PF) 100 MCG/2ML IJ SOLN
25.0000 ug | INTRAMUSCULAR | Status: AC | PRN
  Administered 2016-03-08 (×6): 25 ug via INTRAVENOUS

## 2016-03-08 MED ORDER — ROCURONIUM BROMIDE 100 MG/10ML IV SOLN
INTRAVENOUS | Status: DC | PRN
  Administered 2016-03-08 (×2): 20 mg via INTRAVENOUS
  Administered 2016-03-08: 50 mg via INTRAVENOUS

## 2016-03-08 MED ORDER — MIDAZOLAM HCL 2 MG/2ML IJ SOLN
INTRAMUSCULAR | Status: DC | PRN
Start: 2016-03-08 — End: 2016-03-08
  Administered 2016-03-08: 2 mg via INTRAVENOUS

## 2016-03-08 MED ORDER — ACETAMINOPHEN 10 MG/ML IV SOLN
INTRAVENOUS | Status: AC
  Filled 2016-03-08: qty 100

## 2016-03-08 MED ORDER — ONDANSETRON HCL 4 MG PO TABS: 4.0000 mg | ORAL_TABLET | Freq: Four times a day (QID) | ORAL | Status: DC | PRN

## 2016-03-08 MED ORDER — ONDANSETRON HCL 4 MG/2ML IJ SOLN
INTRAMUSCULAR | Status: DC | PRN
  Administered 2016-03-08: 4 mg via INTRAVENOUS

## 2016-03-08 MED ORDER — AMLODIPINE BESYLATE 5 MG PO TABS
5.0000 mg | ORAL_TABLET | Freq: Every day | ORAL | Status: DC
  Administered 2016-03-09 – 2016-03-10 (×2): 5 mg via ORAL
  Filled 2016-03-08 (×2): qty 1

## 2016-03-08 MED ORDER — PROPOFOL 10 MG/ML IV BOLUS
INTRAVENOUS | Status: DC | PRN
  Administered 2016-03-08: 200 mg via INTRAVENOUS

## 2016-03-08 SURGICAL SUPPLY — 68 items
BLADE SURG 10 STRL SS SAFETY (BLADE) ×2 IMPLANT
BNDG COHESIVE 4X5 TAN STRL (GAUZE/BANDAGES/DRESSINGS) IMPLANT
BRONCHOSCOPE PED SLIM DISP (MISCELLANEOUS) ×2 IMPLANT
CATH THOR STR 28F  SOFT WA (CATHETERS) ×1
CATH THOR STR 28F SOFT WA (CATHETERS) ×1 IMPLANT
CATH TRAY 16F METER LATEX (MISCELLANEOUS) IMPLANT
CATH URET ROBINSON 16FR STRL (CATHETERS) IMPLANT
CHLORAPREP W/TINT 26ML (MISCELLANEOUS) ×4 IMPLANT
CLIPPER SURGICAL HAIR RECHARGE (MISCELLANEOUS) ×2
CORD MONOPOLAR M/FML 12FT (MISCELLANEOUS) ×2 IMPLANT
COVER LIGHT HANDLE STERIS (MISCELLANEOUS) ×2 IMPLANT
CUTTER ECHEON FLEX ENDO 45 340 (ENDOMECHANICALS) ×2 IMPLANT
DEFOGGER SCOPE WARMER CLEARIFY (MISCELLANEOUS) ×2 IMPLANT
DRAIN CHEST DRY SUCT SGL (MISCELLANEOUS) ×2 IMPLANT
DRAPE C-SECTION (MISCELLANEOUS) ×2 IMPLANT
DRAPE MAG INST 16X20 L/F (DRAPES) ×2 IMPLANT
DRSG OPSITE POSTOP 3X4 (GAUZE/BANDAGES/DRESSINGS) IMPLANT
DRSG TEGADERM 2-3/8X2-3/4 SM (GAUZE/BANDAGES/DRESSINGS) ×4 IMPLANT
DRSG TEGADERM 4X4.75 (GAUZE/BANDAGES/DRESSINGS) ×4 IMPLANT
DRSG TELFA 3X8 NADH (GAUZE/BANDAGES/DRESSINGS) ×2 IMPLANT
ELECT BLADE 6 FLAT ULTRCLN (ELECTRODE) ×2 IMPLANT
ELECT BLADE 6.5 EXT (BLADE) ×4 IMPLANT
ELECT CAUTERY BLADE TIP 2.5 (TIP) ×2
ELECT REM PT RETURN 9FT ADLT (ELECTROSURGICAL) ×2
ELECTRODE CAUTERY BLDE TIP 2.5 (TIP) ×1 IMPLANT
ELECTRODE REM PT RTRN 9FT ADLT (ELECTROSURGICAL) ×1 IMPLANT
GAUZE SPONGE 4X4 12PLY STRL (GAUZE/BANDAGES/DRESSINGS) ×2 IMPLANT
GLOVE EXAM LX STRL 7.5 (GLOVE) ×16 IMPLANT
GLOVE INDICATOR 8.0 STRL GRN (GLOVE) ×2 IMPLANT
GOWN STRL REUS W/ TWL LRG LVL3 (GOWN DISPOSABLE) ×4 IMPLANT
GOWN STRL REUS W/TWL LRG LVL3 (GOWN DISPOSABLE) ×4
KIT RM TURNOVER STRD PROC AR (KITS) ×2 IMPLANT
LABEL OR SOLS (LABEL) ×2 IMPLANT
LOOP RED MAXI  1X406MM (MISCELLANEOUS)
LOOP VESSEL MAXI 1X406 RED (MISCELLANEOUS) IMPLANT
MARKER SKIN DUAL TIP RULER LAB (MISCELLANEOUS) ×2 IMPLANT
NEEDLE HYPO 22GX1.5 SAFETY (NEEDLE) ×2 IMPLANT
NEEDLE SPNL 20GX3.5 QUINCKE YW (NEEDLE) IMPLANT
NS IRRIG 500ML POUR BTL (IV SOLUTION) IMPLANT
PACK BASIN MAJOR ARMC (MISCELLANEOUS) ×2 IMPLANT
PIN SAFETY NICK PLATE  2 MED (MISCELLANEOUS) ×1
PIN SAFETY NICK PLATE 2 MED (MISCELLANEOUS) ×1 IMPLANT
RELOAD GOLD ECHELON 45 (STAPLE) ×4 IMPLANT
SCISSORS METZENBAUM CVD 33 (INSTRUMENTS) ×2 IMPLANT
SET ENDOBRONCIAL BLOCKER 9.0 (MISCELLANEOUS) ×2 IMPLANT
SPONGE KITTNER 5P (MISCELLANEOUS) ×4 IMPLANT
STAPLER SKIN PROX 35W (STAPLE) ×2 IMPLANT
STAPLER VASCULAR ECHELON 35 (CUTTER) IMPLANT
STRIP CLOSURE SKIN 1/2X4 (GAUZE/BANDAGES/DRESSINGS) ×2 IMPLANT
SURGICAL HAIR CLIPPER RECHARGE (MISCELLANEOUS) ×1 IMPLANT
SUT ETHILON 4-0 (SUTURE) ×1
SUT ETHILON 4-0 FS2 18XMFL BLK (SUTURE) ×1
SUT MNCRL AB 3-0 PS2 27 (SUTURE) ×4 IMPLANT
SUT SILK 1 SH (SUTURE) ×6 IMPLANT
SUT VIC AB 0 CT1 36 (SUTURE) ×4 IMPLANT
SUT VIC AB 2-0 CT1 27 (SUTURE) ×3
SUT VIC AB 2-0 CT1 TAPERPNT 27 (SUTURE) ×3 IMPLANT
SUT VIC AB 2-0 CT2 27 (SUTURE) IMPLANT
SUT VICRYL 2 TP 1 (SUTURE) IMPLANT
SUTURE ETHLN 4-0 FS2 18XMF BLK (SUTURE) ×1 IMPLANT
SYR BULB IRRIG 60ML STRL (SYRINGE) ×2 IMPLANT
SYRINGE 10CC LL (SYRINGE) ×2 IMPLANT
TAPE ADH 3 LX (MISCELLANEOUS) ×2 IMPLANT
TAPE TRANSPORE STRL 2 31045 (GAUZE/BANDAGES/DRESSINGS) ×2 IMPLANT
TROCAR FLEXIPATH 20X80 (ENDOMECHANICALS) ×4 IMPLANT
TROCAR FLEXIPATH THORACIC 15MM (ENDOMECHANICALS) IMPLANT
WATER STERILE IRR 1000ML POUR (IV SOLUTION) ×6 IMPLANT
YANKAUER SUCT BULB TIP FLEX NO (MISCELLANEOUS) ×2 IMPLANT

## 2016-03-08 NOTE — H&P (Signed)
No change in clinical history or exam. For thoracostomy/ thoracotomy (if indicated) for left lung biopsy.

## 2016-03-08 NOTE — Progress Notes (Signed)
Comfortable. No leak. Has not voided yet.  Will ambulate tonight.

## 2016-03-08 NOTE — Op Note (Signed)
Preoperative diagnosis: Multiple left lung nodules.  Postoperative diagnosis: Same  Operative procedure: Left thoracoscopy with lung biopsy.  Operating surgeon: Hervey Ard, M.D.  Asst. surgeon: Nestor Lewandowsky, M.D.  Anesthesia: Gen. endotracheal, Marcaine 0.5% with 1-200,000 of epinephrine, 30 mL local infiltration.  Estimated blood loss: 10 mL:  Clinical note: This 75 year old male had a chest x-ray as part of preoperative evaluation prior to excision of a melanoma. He was noted to have a pulmonary nodule confirmed on dedicated chest CT. The patient underwent a CT-guided biopsy that was nondiagnostic. He is admitted at this time for planned thoracoscopy / open lung biopsy.  Operative note: The patient received preoperative Kefzol. SCD stockings for DVT prevention. He underwent general endotracheal anesthesia. Attempts at placement of a double lumen tube were unsuccessful even with intraoperative bronchoscopy. A bronchial blocker was used through an 43 [place] endotracheal tube with good occlusion of the left mainstem bronchus. The patient was rolled into the left lateral decubitus position. The left arm was supported in an axillary roll was utilized. Beanbag was inflated and the patient taped in straight lateral position. The chest wall was then prepped with ChloraPrep after hair was removed with clippers. Port sites for thoracoscopy and lung biopsy were chosen. A camera was placed low on the left anterior axillary line, a second port site anteriorly about T5 and a posterior site about T6 for stapler insertion. Thoracoscopy showed some adhesions medially and superiorly. These were taken down with cautery dissection. No bleeding was noted. The lung nodule could be visualized on the medial aspect of the upper lobe. This was isolated and with 2 applications of the gold Echelon stapler the lesion was freed. A sterile glove was placed into the chest, the specimen placed into the glove and then removed  through the medial superior port site. Frozen section report from pathology was lesional cells identified.  Inspection showed good hemostasis. The chest cavity was irrigated. A 28 French chest tube was placed and connected to Pleur-evac drainage. The muscle layer was closed with interrupted 0 and 2-0 Vicryl figure-of-eight sutures. Adipose tissue was closed with 2-0 Vicryl tissue. The skin was closed with a running 5-0 Monocryl suture. With reinflation of the lung no air leak was noted.  Benzoin and Steri-Strips followed by Telfa and Tegaderm dressings applied.  Chest x-ray in the recovery room showed no evidence of pneumothorax and appropriate chest tube placement. Staples are noted in the medial aspect of left upper lobe corresponding to the preoperative CT.  The patient tolerated the procedure well and was taken to the recovery room in stable condition.

## 2016-03-08 NOTE — Anesthesia Procedure Notes (Signed)
Procedure Name: Intubation Performed by: Lance Muss Pre-anesthesia Checklist: Emergency Drugs available, Patient identified, Suction available, Patient being monitored and Timeout performed Patient Re-evaluated:Patient Re-evaluated prior to inductionOxygen Delivery Method: Circle system utilized Preoxygenation: Pre-oxygenation with 100% oxygen Intubation Type: IV induction Ventilation: Mask ventilation without difficulty Laryngoscope Size: Mac and 4 Grade View: Grade I Tube type: Oral Endobronchial tube: Left and Double lumen EBT and 39 Fr Tube size: 8.0 mm Number of attempts: 1 Airway Equipment and Method: Stylet Placement Confirmation: ETT inserted through vocal cords under direct vision,  positive ETCO2 and breath sounds checked- equal and bilateral Secured at: 24 cm Tube secured with: Tape Dental Injury: Teeth and Oropharynx as per pre-operative assessment  Comments: Successfully passed DLT left through vocal cords. Unable to placed and isolate left side. Extubated and placed a single lumen tube. Passed ETT through cords with positive ETCO2. Dr. Genevive Bi with Dr Bary Castilla to assist placed bronchial blocker down left to isolate left lung. Patient tolerated well.

## 2016-03-08 NOTE — Anesthesia Preprocedure Evaluation (Addendum)
Anesthesia Evaluation  Patient identified by MRN, date of birth, ID band Patient awake    Reviewed: Allergy & Precautions, NPO status , Patient's Chart, lab work & pertinent test results, reviewed documented beta blocker date and time   Airway Mallampati: II  TM Distance: >3 FB     Dental  (+) Chipped, Upper Dentures, Poor Dentition, Partial Lower, Missing   Pulmonary former smoker,           Cardiovascular hypertension, Pt. on medications and Pt. on home beta blockers + CAD       Neuro/Psych    GI/Hepatic   Endo/Other    Renal/GU      Musculoskeletal   Abdominal   Peds  Hematology   Anesthesia Other Findings Cardiac stents. Takes plavix.  Reproductive/Obstetrics                            Anesthesia Physical Anesthesia Plan  ASA: III  Anesthesia Plan: General   Post-op Pain Management:    Induction: Intravenous  Airway Management Planned: Double Lumen EBT  Additional Equipment:   Intra-op Plan:   Post-operative Plan:   Informed Consent: I have reviewed the patients History and Physical, chart, labs and discussed the procedure including the risks, benefits and alternatives for the proposed anesthesia with the patient or authorized representative who has indicated his/her understanding and acceptance.     Plan Discussed with: CRNA  Anesthesia Plan Comments:         Anesthesia Quick Evaluation

## 2016-03-08 NOTE — Transfer of Care (Signed)
Immediate Anesthesia Transfer of Care Note  Patient: Glen Davis  Procedure(s) Performed: Procedure(s): VIDEO ASSISTED THORACOSCOPY (VATS) with lung biopsy - Left  (Left)  Patient Location: PACU  Anesthesia Type:General  Level of Consciousness: awake, alert  and oriented  Airway & Oxygen Therapy: Patient Spontanous Breathing and Patient connected to face mask oxygen  Post-op Assessment: Report given to RN and Post -op Vital signs reviewed and stable  Post vital signs: Reviewed and stable  Last Vitals:  Filed Vitals:   03/08/16 0812 03/08/16 1201  BP: 128/77 140/81  Pulse: 55 55  Temp: 36.5 C   Resp: 16 15    Complications: No apparent anesthesia complications

## 2016-03-09 ENCOUNTER — Encounter: Payer: Self-pay | Admitting: General Surgery

## 2016-03-09 NOTE — Progress Notes (Signed)
AVSS. Feeling well. Mild soreness. Lungs: Clear. CT: No leak. Dressings: Dry. Discussed with Dr. Genevive Bi: Water seal today, CXR in AM, home tomorrow.

## 2016-03-09 NOTE — Anesthesia Postprocedure Evaluation (Signed)
Anesthesia Post Note  Patient: Glen Davis  Procedure(s) Performed: Procedure(s) (LRB): VIDEO ASSISTED THORACOSCOPY (VATS) with lung biopsy - Left  (Left)  Patient location during evaluation: PACU Anesthesia Type: General Level of consciousness: awake and alert Pain management: pain level controlled Vital Signs Assessment: post-procedure vital signs reviewed and stable Respiratory status: spontaneous breathing, nonlabored ventilation, respiratory function stable and patient connected to nasal cannula oxygen Cardiovascular status: blood pressure returned to baseline and stable Postop Assessment: no signs of nausea or vomiting Anesthetic complications: no    Last Vitals:  Filed Vitals:   03/09/16 0445 03/09/16 1008  BP: 129/65 111/67  Pulse: 50 46  Temp: 36.3 C 36.7 C  Resp: 15     Last Pain:  Filed Vitals:   03/09/16 1916  PainSc: [place]

## 2016-03-09 NOTE — Consult Note (Signed)
   [facility] Inpatient Consult   03/09/2016  Glen Davis 03-29-1941 [id number]  Patient screened for potential [place] Management services. Patient is eligible for [place]. Epic reveals there were no identifiable [facility] care management needs at this time. [facility] Care Management services not appropriate at this time. If patient's post hospital needs change please place a [facility] Care Management consult. For questions please contact:   Rayla Pember RN, [place] [facility] Liaison  [phone]) Business Mobile [phone]) Toll free office

## 2016-03-09 NOTE — Progress Notes (Signed)
Pt ambulated twice around the nursing station with wife. Pt did very well. Ambulated a total of 320 ft. Will continue to encourage ambulation.   Angus Seller

## 2016-03-10 ENCOUNTER — Inpatient Hospital Stay: Payer: PPO

## 2016-03-10 DIAGNOSIS — Z4682 Encounter for fitting and adjustment of non-vascular catheter: Secondary | ICD-10-CM | POA: Diagnosis not present

## 2016-03-10 MED ORDER — HYDROCODONE-ACETAMINOPHEN 5-325 MG PO TABS: 1.0000 | ORAL_TABLET | ORAL | Status: DC | PRN

## 2016-03-10 NOTE — Final Progress Note (Signed)
Pt with no complaints. Chest tube intact-no air leak.  CXR- no pneumo. Chest tube removed without any difficulty. CXR post removal- no pneumo. OK to discharge

## 2016-03-10 NOTE — Progress Notes (Signed)
Dr. Jamal Collin ordered discharge home.  Discharge instructions, prescriptions and instructions for follow up appointment was reviewed with patient and his wife.  Questions were encouraged and there were none.  Patient and wife both verbalized understanding of information given.  Will call for wheelchair at this time.

## 2016-03-11 ENCOUNTER — Telehealth: Payer: Self-pay | Admitting: General Surgery

## 2016-03-11 NOTE — Telephone Encounter (Signed)
Notified of path results and need for med onc assessment. Wife, [name], treated by Dr. Oliva Bustard. Will get his assessment.  Still with significant pain. Instructed to use Aleve, 2 tablets BID w/ prn narcotic. Heating pad as well.  F/U week of May 1 w/ CXR.

## 2016-03-12 ENCOUNTER — Other Ambulatory Visit: Payer: Self-pay | Admitting: *Deleted

## 2016-03-12 DIAGNOSIS — C799 Secondary malignant neoplasm of unspecified site: Secondary | ICD-10-CM

## 2016-03-12 LAB — SURGICAL PATHOLOGY

## 2016-03-13 ENCOUNTER — Telehealth: Payer: Self-pay

## 2016-03-13 NOTE — Telephone Encounter (Signed)
Spoke with patient about seeing Dr Oliva Bustard at [facility]. Patient is scheduled to see Dr Oliva Bustard on 03/15/16 at 9:00 am. He will follow up here in the office on 03/20/16 at 4:00 pm. He will go to [facility] to obtain his chest xray just prior to this appointment. Patient is aware of dates, times, and instructions.

## 2016-03-13 NOTE — Telephone Encounter (Signed)
-----   Message from Robert Bellow, MD sent at 03/11/2016  8:47 PM EDT ----- Please make an appointment with Dr. Oliva Bustard for next week re: metastatic clear cell carcinoma.  (He treated patient's wife).If there is any talk about Dr. Oliva Bustard retiring,ne new patients, etc, tell them they do not want me to be calling.   Wife ([name]) will be calling for an appointment for week of May 1.  Arrange for PA & lat CXR just prior to visit: f/u lung biopsy. Thanks.

## 2016-03-14 ENCOUNTER — Telehealth: Payer: Self-pay | Admitting: *Deleted

## 2016-03-14 DIAGNOSIS — C801 Malignant (primary) neoplasm, unspecified: Secondary | ICD-10-CM

## 2016-03-14 NOTE — Telephone Encounter (Signed)
-----   Message from Lesly Rubenstein, LPN sent at 3/36/1224  8:51 AM EDT -----   ----- Message -----    From: Robert Bellow, MD    Sent: 03/12/2016   6:06 PM      To: Lesly Rubenstein, LPN  Please schedule the patient for a CT of the kidneys with contrast diagnosis metastatic clear cell carcinoma. Thank you

## 2016-03-14 NOTE — Telephone Encounter (Signed)
Patient has been scheduled for a CT abdomen with contrast attn: [facility] for 03-23-16 at 4 pm (arrive 3:45 pm). Prep: pick up prep kit, may only have water after 12 noon day of scan, and drink his first bottle of prep at 2 pm and second bottle at 3 pm. He verbalizes understanding.

## 2016-03-15 ENCOUNTER — Inpatient Hospital Stay: Payer: PPO

## 2016-03-15 ENCOUNTER — Inpatient Hospital Stay: Payer: PPO | Attending: Oncology | Admitting: Oncology

## 2016-03-15 VITALS — BP 124/72 | HR 52 | Temp 95.8°F | Resp 18 | Wt 214.5 lb

## 2016-03-15 DIAGNOSIS — Z808 Family history of malignant neoplasm of other organs or systems: Secondary | ICD-10-CM | POA: Insufficient documentation

## 2016-03-15 DIAGNOSIS — I2510 Atherosclerotic heart disease of native coronary artery without angina pectoris: Secondary | ICD-10-CM | POA: Diagnosis not present

## 2016-03-15 DIAGNOSIS — C4359 Malignant melanoma of other part of trunk: Secondary | ICD-10-CM | POA: Insufficient documentation

## 2016-03-15 DIAGNOSIS — R918 Other nonspecific abnormal finding of lung field: Secondary | ICD-10-CM

## 2016-03-15 DIAGNOSIS — I10 Essential (primary) hypertension: Secondary | ICD-10-CM | POA: Diagnosis not present

## 2016-03-15 DIAGNOSIS — C649 Malignant neoplasm of unspecified kidney, except renal pelvis: Secondary | ICD-10-CM | POA: Diagnosis not present

## 2016-03-15 DIAGNOSIS — C7802 Secondary malignant neoplasm of left lung: Secondary | ICD-10-CM | POA: Diagnosis not present

## 2016-03-15 DIAGNOSIS — Z87891 Personal history of nicotine dependence: Secondary | ICD-10-CM | POA: Diagnosis not present

## 2016-03-15 DIAGNOSIS — Z7982 Long term (current) use of aspirin: Secondary | ICD-10-CM | POA: Diagnosis not present

## 2016-03-15 DIAGNOSIS — Z79899 Other long term (current) drug therapy: Secondary | ICD-10-CM | POA: Insufficient documentation

## 2016-03-15 DIAGNOSIS — C439 Malignant melanoma of skin, unspecified: Secondary | ICD-10-CM

## 2016-03-15 LAB — COMPREHENSIVE METABOLIC PANEL
ALT: 23 U/L (ref 17–63)
AST: 25 U/L (ref 15–41)
Albumin: 4.6 g/dL (ref 3.5–5.0)
Alkaline Phosphatase: 81 U/L (ref 38–126)
Anion gap: 9 (ref 5–15)
BUN: 14 mg/dL (ref 6–20)
CHLORIDE: 104 mmol/L (ref 101–111)
CO2: 25 mmol/L (ref 22–32)
CREATININE: 0.89 mg/dL (ref 0.61–1.24)
Calcium: 9.7 mg/dL (ref 8.9–10.3)
GFR calc Af Amer: >60 mL/min (ref >60)
GFR calc non Af Amer: >60 mL/min (ref >60)
Glucose, Bld: 121 mg/dL — ABNORMAL HIGH (ref 65–99)
Potassium: 3.8 mmol/L (ref 3.5–5.1)
SODIUM: 138 mmol/L (ref 135–145)
Total Bilirubin: 1.2 mg/dL (ref 0.3–1.2)
Total Protein: 7.5 g/dL (ref 6.5–8.1)

## 2016-03-15 LAB — CBC WITH DIFFERENTIAL/PLATELET
BASOS ABS: 0.1 10*3/uL (ref 0–0.1)
Basophils Relative: 1 %
EOS ABS: 0.1 10*3/uL (ref 0–0.7)
HCT: 45.1 % (ref 40.0–52.0)
Hemoglobin: 16.3 g/dL (ref 13.0–18.0)
Lymphs Abs: 1.4 10*3/uL (ref 1.0–3.6)
MCH: 33.3 pg (ref 26.0–34.0)
MCHC: 36.2 g/dL — ABNORMAL HIGH (ref 32.0–36.0)
MCV: 91.8 fL (ref 80.0–100.0)
Monocytes Absolute: 0.8 10*3/uL (ref 0.2–1.0)
Monocytes Relative: 10 %
Neutro Abs: 5.7 10*3/uL (ref 1.4–6.5)
Neutrophils Relative %: 69 %
PLATELETS: 185 10*3/uL (ref 150–440)
RBC: 4.92 MIL/uL (ref 4.40–5.90)
RDW: 13 % (ref 11.5–14.5)
WBC: 8.2 10*3/uL (ref 3.8–10.6)

## 2016-03-15 NOTE — Progress Notes (Signed)
Patient here today as new evaluation regarding lung cancer.  Referred by Dr. Bary Castilla.

## 2016-03-16 ENCOUNTER — Encounter: Payer: Self-pay | Admitting: Oncology

## 2016-03-16 DIAGNOSIS — I10 Essential (primary) hypertension: Secondary | ICD-10-CM | POA: Insufficient documentation

## 2016-03-16 DIAGNOSIS — I2510 Atherosclerotic heart disease of native coronary artery without angina pectoris: Secondary | ICD-10-CM | POA: Insufficient documentation

## 2016-03-16 DIAGNOSIS — E781 Pure hyperglyceridemia: Secondary | ICD-10-CM | POA: Insufficient documentation

## 2016-03-16 DIAGNOSIS — N400 Enlarged prostate without lower urinary tract symptoms: Secondary | ICD-10-CM | POA: Insufficient documentation

## 2016-03-16 NOTE — Progress Notes (Signed)
[place] @ [facility] [phone]  Fax:([phone]) [place]  Glen Davis OB: [birth year]  MR#: [id number]  OTR#:[id number]  Patient Care Team: Dion Body, MD as PCP - General (Family Medicine) Jannet Mantis, MD (Dermatology) Robert Bellow, MD (General Surgery)  CHIEF COMPLAINT:  Chief Complaint  Patient presents with  . 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  Status post wide excision without any residual malignancy in March of 2017\ 3.  PET scan was consistent with hypermetabolic lesion with low SUV activity in the lung.  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 Melanoma of skin (HCC) 172.9 C43.9 CBC with Differential     Comprehensive metabolic panel  2. Pulmonary nodules/lesions, multiple 793.19 R91.8 CBC with Differential     Comprehensive metabolic panel      No history exists.      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  Patient has multiple moles on the back and in initial examination revealed an abnormal MOLE.  Patient was referred to Dr. Bary Castilla by dermatologist.. Biopsy of skin lesion was consistent with malignant L unknown, patient underwent wide excision without any significant residual disease. Chest x-ray revealed pulmonary nodules.  PET scan was consistent with hypermetabolic lesion in the lung with low SUV activity.  Has multiple bilateral pulmonary nodules.  Needle biopsy of the left upper lobe under CT guidance was negative and patient underwent resection of left pulmonary nodule which was consistent with clear cell  carcinoma. Patient remains asymptomatic.  No history of hematuria.  Has a history of benign prosthetic hypertrophy. Reviewing CT scan without contrast revealed left kidney mass.  Overall clinical picture was consistent with primary kidney cancer and metastases to the lung.  Patient was referred to me for further evaluation and treatment consideration  REVIEW OF SYSTEMS:   GENERAL:  Feels good.  Active.  No fevers, sweats or weight loss. PERFORMANCE STATUS (ECOG):  0 HEENT:  No visual changes, runny nose, sore throat, mouth sores or tenderness. Lungs: No shortness of breath or cough.  No hemoptysis. Cardiac:  No chest pain, palpitations, orthopnea, or PND. GI:  No nausea, vomiting, diarrhea, constipation, melena or hematochezia. GU:  No urgency, frequency, dysuria, or hematuria. Musculoskeletal:  No back pain.  No joint pain.  No muscle tenderness. Extremities:  No pain or swelling. Skin:  History of malignant melanoma recently diagnosed.  History of multiple moles being followed by dermatologist Patient also has a previous history of malignant melanoma in the left upper extremity Neuro:  No headache, numbness or weakness, balance or coordination issues. Endocrine:  No diabetes, thyroid issues, hot flashes or night sweats. Psych:  No mood changes, depression or anxiety. Pain:  No focal pain. Review of systems:  All other systems reviewed and found to be negative.  As per HPI. Otherwise, a complete review of systems is negatve.  PAST MEDICAL HISTORY: Past Medical History  Diagnosis Date  . Hypertension   . CAD (coronary artery disease)   . Cancer ([place])     left arm    PAST SURGICAL HISTORY: Past Surgical History  Procedure Laterality Date  . Cardiac stents  2011    Angioplasty / Stenting Femoral-X2  . Arm surgery Left     arm  . Colonoscopy  04/01/12  . Excision melanoma with sentinel lymph node biopsy Right 01/24/2016    Procedure: EXCISION MELANOMA Right Shoulder;  Surgeon:  Robert Bellow, MD;  Location: [facility] ORS;  Service: General;  Laterality: Right;  . Coronary angioplasty    . Video assisted thoracoscopy (vats)/thorocotomy Left 03/08/2016    Procedure: VIDEO ASSISTED THORACOSCOPY (VATS) with lung biopsy - Left ;  Surgeon: Robert Bellow, MD;  Location: [facility] ORS;  Service: General;  Laterality: Left;    FAMILY HISTORY Family History  Problem Relation Age of Onset  . Hodgkin's lymphoma Daughter 5    GYNECOLOGIC HISTORY:  No LMP for male patient.     ADVANCED DIRECTIVES:    HEALTH MAINTENANCE: Social History  Substance Use Topics  . Smoking status: Former Smoker    Types: Cigarettes    Start date: 01/18/1956    Quit date: 01/17/1969  . Smokeless tobacco: None  . Alcohol Use: 0.0 oz/week    0 Standard drinks or equivalent per week     Comment: BEER OCC     The patient is getting regular colonoscopy.  Also had prostate checkup and PSA done by primary care physician  Allergies  Allergen Reactions  . Lisinopril Swelling  . Other Itching    Nitroglycerin Patch.    Current Outpatient Prescriptions  Medication Sig Dispense Refill  . amLODipine (NORVASC) 5 MG tablet TAKE 1 TABLET (5 MG TOTAL) BY MOUTH ONCE DAILY-AM  1  . aspirin 81 MG tablet Take 81 mg by mouth daily.    Marland Kitchen atorvastatin (LIPITOR) 40 MG tablet TAKE 1 TABLET (40 MG TOTAL) BY MOUTH ONCE DAILY-AM  11  . clopidogrel (PLAVIX) 75 MG tablet Take 75 mg by mouth daily.    . hydrochlorothiazide (HYDRODIURIL) 12.5 MG tablet Take 12.5 mg by mouth daily.     Marland Kitchen HYDROcodone-acetaminophen (NORCO/VICODIN) 5-325 MG tablet Take 1-2 tablets by mouth every 4 (four) hours as needed for moderate pain. 30 tablet 0  . losartan (COZAAR) 50 MG tablet Take 50 mg by mouth every morning.     . metoprolol succinate (TOPROL-XL) 50 MG 24 hr tablet Take 50 mg by mouth every morning.     . tamsulosin (FLOMAX) 0.4 MG CAPS capsule Take 0.4 mg by mouth every morning.     . Potassium Chloride ER 20 MEQ TBCR  TAKE 2 TABLETS BY MOUTH TWICE A DAY UNTIL SURGERY  0   No current facility-administered medications for this visit.    OBJECTIVE: PHYSICAL EXAM:   GENERAL:  Well developed, well nourished, sitting comfortably in the exam room in no acute distress. MENTAL STATUS:  Alert and oriented to person, place and time. HEAD: .  Normocephalic, atraumatic, face symmetric, no Cushingoid features. EYES:  .  Pupils equal round and reactive to light and accomodation.  No conjunctivitis or scleral icterus. ENT:  Oropharynx clear without lesion.  Tongue normal. Mucous membranes moist.  RESPIRATORY:  Clear to auscultation without rales, wheezes or rhonchi. CARDIOVASCULAR:  Regular rate and rhythm without murmur, rub or gallop. BREAST:  Right breast without masses, skin changes or nipple discharge.  Left breast without masses, skin changes or nipple discharge. ABDOMEN:  Soft, non-tender, with active bowel sounds, and no hepatosplenomegaly.  No masses. BACK:  No CVA tenderness.  No tenderness on percussion of the back or rib  cage. SKIN:  No rashes, ulcers or lesions.  Patient has multiple moles on the back EXTREMITIES: No edema, no skin discoloration or tenderness.  No palpable cords. LYMPH NODES: No palpable cervical, supraclavicular, axillary or inguinal adenopathy  NEUROLOGICAL: Unremarkable. PSYCH:  Appropriate.  Filed Vitals:   03/15/16 0929  BP: 124/72  Pulse: 52  Temp: 95.8 F (35.4 C)  Resp: 18     Body mass index is 28.31 kg/(m^2).    ECOG FS:0 - Asymptomatic  LAB RESULTS:  Appointment on 03/15/2016  Component Date Value Ref Range Status  . WBC 03/15/2016 8.2  3.8 - 10.6 K/uL Final  . RBC 03/15/2016 4.92  4.40 - 5.90 MIL/uL Final  . Hemoglobin 03/15/2016 16.3  13.0 - 18.0 g/dL Final   RESULT REPEATED AND VERIFIED  . HCT 03/15/2016 45.1  40.0 - 52.0 % Final   RESULT REPEATED AND VERIFIED  . MCV 03/15/2016 91.8  80.0 - 100.0 fL Final  . MCH 03/15/2016 33.3  26.0 - 34.0 pg Final  . MCHC  03/15/2016 36.2* 32.0 - 36.0 g/dL Final  . RDW 03/15/2016 13.0  11.5 - 14.5 % Final  . Platelets 03/15/2016 185  150 - 440 K/uL Final  . Neutrophils Relative % 03/15/2016 69%   Final  . Neutro Abs 03/15/2016 5.7  1.4 - 6.5 K/uL Final  . Lymphocytes Relative 03/15/2016 18%   Final  . Lymphs Abs 03/15/2016 1.4  1.0 - 3.6 K/uL Final  . Monocytes Relative 03/15/2016 10%   Final  . Monocytes Absolute 03/15/2016 0.8  0.2 - 1.0 K/uL Final  . Eosinophils Relative 03/15/2016 2%   Final  . Eosinophils Absolute 03/15/2016 0.1  0 - 0.7 K/uL Final  . Basophils Relative 03/15/2016 1%   Final  . Basophils Absolute 03/15/2016 0.1  0 - 0.1 K/uL Final  . Sodium 03/15/2016 138  135 - 145 mmol/L Final  . Potassium 03/15/2016 3.8  3.5 - 5.1 mmol/L Final  . Chloride 03/15/2016 104  101 - 111 mmol/L Final  . CO2 03/15/2016 25  22 - 32 mmol/L Final  . Glucose, Bld 03/15/2016 121* 65 - 99 mg/dL Final  . BUN 03/15/2016 14  6 - 20 mg/dL Final  . Creatinine, Ser 03/15/2016 0.89  0.61 - 1.24 mg/dL Final  . Calcium 03/15/2016 9.7  8.9 - 10.3 mg/dL Final  . Total Protein 03/15/2016 7.5  6.5 - 8.1 g/dL Final  . Albumin 03/15/2016 4.6  3.5 - 5.0 g/dL Final  . AST 03/15/2016 25  15 - 41 U/L Final  . ALT 03/15/2016 23  17 - 63 U/L Final  . Alkaline Phosphatase 03/15/2016 81  38 - 126 U/L Final  . Total Bilirubin 03/15/2016 1.2  0.3 - 1.2 mg/dL Final  . GFR calc non Af Amer 03/15/2016 >60  >60 mL/min Final  . GFR calc Af Amer 03/15/2016 >60  >60 mL/min Final   Comment: (NOTE) The eGFR has been calculated using the CKD EPI equation. This calculation has not been validated in all clinical situations. eGFR's persistently <60 mL/min signify possible Chronic Kidney Disease.   . Anion gap 03/15/2016 9  5 - 15 Final     STUDIES: Dg Chest 1 View  03/08/2016  CLINICAL DATA:  Status post left thoracoscopy and lung biopsy today. Subsequent encounter. EXAM: CHEST 1 VIEW COMPARISON:  Single-view of the chest 02/16/2016. PA  and lateral chest 01/12/2016. PET CT scan 02/01/2016. FINDINGS: The patient has a new left chest tube in place. There  is no pneumothorax. Bibasilar atelectasis is seen. Small pulmonary nodules seen on prior CT scan are not visualized on this examination. Heart size is upper normal. IMPRESSION: Postoperative change left chest with a chest tube in place. Negative for pneumothorax or acute disease. Known pulmonary nodules are better seen on prior CT. Electronically Signed   By: Inge Rise M.D.   On: 03/08/2016 12:21   Dg Chest 1 View  02/16/2016  CLINICAL DATA:  Status post CT-guided biopsy of left upper lobe lung nodule with adjacent parenchymal hemorrhage noted by CT. EXAM: CHEST 1 VIEW COMPARISON:  01/12/2016 FINDINGS: The heart size and mediastinal contours are within normal limits. There is no evidence of pulmonary edema, consolidation, pneumothorax, nodule or pleural fluid. The visualized skeletal structures are unremarkable. IMPRESSION: No pneumothorax or progressive airspace abnormality is seen following left lung biopsy. Electronically Signed   By: Aletta Edouard M.D.   On: 02/16/2016 16:00   Ct Biopsy  03/09/2016  ADDENDUM REPORT: 03/09/2016 16:08 ADDENDUM: The patient's level of consciousness and physiologic status were continuously monitored during the procedure by Radiology nursing. Electronically Signed   By: Aletta Edouard M.D.   On: 03/09/2016 16:08  03/09/2016  CLINICAL DATA:  History of malignant melanoma of the right posterior shoulder/ upper back. Multiple pulmonary nodules are present with dominant 14 mm left apical nodule and 13 mm anterior left upper lobe subpleural nodule. EXAM: CT GUIDED CORE BIOPSY OF LEFT LUNG ANESTHESIA/SEDATION: 3.0  Mg IV Versed; 200 mcg IV Fentanyl Total Moderate Sedation Time: 40 minutes. PROCEDURE: The procedure risks, benefits, and alternatives were explained to the patient. Questions regarding the procedure were encouraged and answered. The patient  understands and consents to the procedure. A time-out was performed prior to the procedure. The left anterior chest wall was prepped with chlorhexidine in a sterile fashion, and a sterile drape was applied covering the operative field. A sterile gown and sterile gloves were used for the procedure. Local anesthesia was provided with 1% Lidocaine. Under CT fluoroscopic guidance, a 17 gauge needle was advanced from an antral lateral approach to the level of an anterior subpleural left upper lobe nodule. Ultimately, this approach was abandoned. Under CT fluoroscopic guidance, a 17 gauge needle was advanced from a more medial approach to the level of the left upper lobe nodule. A total of 3 separate coaxial 18 gauge core biopsy samples were obtained. After completion, the Biosentry device was utilized to place a plug at the pleural entry site. Additional CT was performed. COMPLICATIONS: No pneumothorax.  Mild amount of pulmonary hemorrhage around nodule. SIR level A: No therapy, no consequence. FINDINGS: Subpleural nodule in the anterior left upper lobe measures approximately 1.1 x 1.3 cm. This was chosen for sampling over the apical 14 mm nodule given its close proximity to the skin and high air risk of pneumothorax and hemorrhage with approaching the apical nodule. From a lateral approach, biopsy could not be performed due to persistent intervening anterior rib and calcified cartilage making it not possible to advance the outer needle successfully to the nodule. From a more medial approach, the nodule was able to be approached. Core biopsy yielded only small fragments of tissue. Postprocedural imaging shows a small amount of regional hemorrhage along the posterior and deep margin of the nodule. This was not associated with pneumothorax. IMPRESSION: CT-guided biopsy performed of a 1.3 cm anterior left upper lobe subpleural lung nodule. Core sampling resulted in only small fragments of tissue. The procedure was  complicated by a  small amount of pulmonary hemorrhage without pneumothorax. Electronically Signed: By: Aletta Edouard M.D. On: 02/16/2016 15:59   Dg Chest Port 1 View  03/10/2016  CLINICAL DATA:  75 year old male status post left-sided chest tube removal EXAM: PORTABLE CHEST 1 VIEW COMPARISON:  Prior chest x-ray earlier today at 5:56 a.m. FINDINGS: Interval removal of left-sided chest tube. No evidence of pneumothorax. Mild residual left basilar atelectasis. Stable cardiac and mediastinal contours. No acute osseous abnormality. IMPRESSION: No evidence of pneumothorax following removal of left-sided chest tube. Electronically Signed   By: Jacqulynn Cadet M.D.   On: 03/10/2016 10:21   Dg Chest Port 1 View  03/10/2016  CLINICAL DATA:  Status post lung biopsy. History of hypertension and coronary artery disease. EXAM: PORTABLE CHEST 1 VIEW COMPARISON:  03/08/2016 FINDINGS: Mild lung base atelectasis, improved from prior study. Persistent irregular interstitial thickening, chronic. No evidence of pneumonia or edema. Left chest tube is stable.  Tip projects over the aortic knob. No pneumothorax.  No convincing pleural effusion. Cardiac silhouette is normal in size. Normal mediastinal and hilar contours. IMPRESSION: 1. No pneumothorax.  Stable left chest tube. 2. Improved lung base atelectasis.  No acute findings in the lungs. Electronically Signed   By: Lajean Manes M.D.   On: 03/10/2016 07:36    ASSESSMENT:  1.  Review of CT scan is consistent with 4.2 cm kidney mass in the left kidney.  Pathology has been reviewed and CT scan has been reviewed independently today also in case conference. 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  Malignant melanoma with Breslow depth of 0.42 mm status post wide excision without any residual malignancy  PLAN:    1.case was discussed in tumor conference today on the x-rays were reviewed.   Considering the very low tumor burden in the lung and  the fact that patient had very low SUV activity.  The possibility of nephrectomy can be considered   CT scan of abdomen has been ordered to further define a kidney mass with contrast CT scan and evaluate lymph nodes and extension of the kidney mass Situation was discussed with Dr. Rayburn Ma and she is in agreement for either partial or total nephrectomy Considering patient's good performance status, all tumor burden is very small outside the kidney as well as low SUV activity patient may benefit by nephrectomy.  I have not discuss this plan yet with the patient.  Awaiting CT scan another appointment has been made next week to discuss this plan with the patient.  I will discuss situation with Dr. Bary Castilla. 2.  Regarding malignant melanoma and no further therapy has been recommended   Patient expressed understanding and was in agreement with this plan. He also understands that He can call clinic at any time with any questions, concerns, or complaints.    No matching staging information was found for the patient.  Forest Gleason, MD   03/16/2016 6:47 AM

## 2016-03-19 ENCOUNTER — Ambulatory Visit (INDEPENDENT_AMBULATORY_CARE_PROVIDER_SITE_OTHER): Payer: PPO | Admitting: General Surgery

## 2016-03-19 ENCOUNTER — Ambulatory Visit
Admission: RE | Admit: 2016-03-19 | Discharge: 2016-03-19 | Disposition: A | Discharge status: Home / Self Care | Payer: PPO | Source: Ambulatory Visit | Attending: General Surgery | Admitting: General Surgery

## 2016-03-19 DIAGNOSIS — R918 Other nonspecific abnormal finding of lung field: Secondary | ICD-10-CM | POA: Insufficient documentation

## 2016-03-19 DIAGNOSIS — C799 Secondary malignant neoplasm of unspecified site: Secondary | ICD-10-CM

## 2016-03-19 DIAGNOSIS — E041 Nontoxic single thyroid nodule: Secondary | ICD-10-CM

## 2016-03-19 DIAGNOSIS — D039 Melanoma in situ, unspecified: Secondary | ICD-10-CM | POA: Insufficient documentation

## 2016-03-19 DIAGNOSIS — C642 Malignant neoplasm of left kidney, except renal pelvis: Secondary | ICD-10-CM

## 2016-03-19 DIAGNOSIS — C801 Malignant (primary) neoplasm, unspecified: Secondary | ICD-10-CM

## 2016-03-20 ENCOUNTER — Ambulatory Visit: Payer: PPO | Admitting: General Surgery

## 2016-03-21 NOTE — Progress Notes (Signed)
Patient ID: Glen Davis, male   DOB: Nov 05, 1941, 75 y.o.   MRN: [id number]  No chief complaint on file.   HPI Glen Davis is a 75 y.o. male.  The patient is following up status post 03/08/2016 video-assisted thoracoscopy and resection of a left upper lobe nodule showing metastatic clear cell carcinoma. He's done well since his chest tube removal prior to discharge. Marked increase in activity level and decrease in pain. No shortness of breath.   HPI  Past Medical History  Diagnosis Date  . Hypertension   . CAD (coronary artery disease)   . Cancer [facility])     left arm    Past Surgical History  Procedure Laterality Date  . Cardiac stents  2011    Angioplasty / Stenting Femoral-X2  . Arm surgery Left     arm  . Colonoscopy  04/01/12  . Excision melanoma with sentinel lymph node biopsy Right 01/24/2016    Procedure: EXCISION MELANOMA Right Shoulder;  Surgeon: Robert Bellow, MD;  Location: [facility] ORS;  Service: General;  Laterality: Right;  . Coronary angioplasty    . Video assisted thoracoscopy (vats)/thorocotomy Left 03/08/2016    Procedure: VIDEO ASSISTED THORACOSCOPY (VATS) with lung biopsy - Left ;  Surgeon: Robert Bellow, MD;  Location: [facility] ORS;  Service: General;  Laterality: Left;    Family History  Problem Relation Age of Onset  . Hodgkin's lymphoma Daughter 5    Social History Social History  Substance Use Topics  . Smoking status: Former Smoker    Types: Cigarettes    Start date: 01/18/1956    Quit date: 01/17/1969  . Smokeless tobacco: Not on file  . Alcohol Use: 0.0 oz/week    0 Standard drinks or equivalent per week     Comment: BEER OCC    Allergies  Allergen Reactions  . Lisinopril Swelling  . Other Itching    Nitroglycerin Patch.    Current Outpatient Prescriptions  Medication Sig Dispense Refill  . amLODipine (NORVASC) 5 MG tablet TAKE 1 TABLET (5 MG TOTAL) BY MOUTH ONCE DAILY-AM  1  . aspirin 81 MG tablet Take 81 mg by mouth daily.    Marland Kitchen  atorvastatin (LIPITOR) 40 MG tablet TAKE 1 TABLET (40 MG TOTAL) BY MOUTH ONCE DAILY-AM  11  . clopidogrel (PLAVIX) 75 MG tablet Take 75 mg by mouth daily.    . hydrochlorothiazide (HYDRODIURIL) 12.5 MG tablet Take 12.5 mg by mouth daily.     Marland Kitchen HYDROcodone-acetaminophen (NORCO/VICODIN) 5-325 MG tablet Take 1-2 tablets by mouth every 4 (four) hours as needed for moderate pain. 30 tablet 0  . losartan (COZAAR) 50 MG tablet Take 50 mg by mouth every morning.     . metoprolol succinate (TOPROL-XL) 50 MG 24 hr tablet Take 50 mg by mouth every morning.     . Potassium Chloride ER 20 MEQ TBCR TAKE 2 TABLETS BY MOUTH TWICE A DAY UNTIL SURGERY  0  . tamsulosin (FLOMAX) 0.4 MG CAPS capsule Take 0.4 mg by mouth every morning.      No current facility-administered medications for this visit.    Review of Systems Review of Systems  There were no vitals taken for this visit.  Physical Exam Physical Exam  Pulmonary/Chest: Effort normal and breath sounds normal.  Pursestring suture at chest tube site removed without incident.    Data Reviewed Review of the PET/CT on retrospect shows a mass lesion in the left kidney. Likely the source of the metastatic clear  cell carcinoma.  Chest x-ray obtained on 03/19/2016 showed no pneumothorax.  Assessment    Doing well status post thoracoscopy and biopsy.    Plan    The patient has been evaluated by medical oncology. Likely referral to urology for consideration of nephrectomy after formal CT of the kidneys.  Follow-up here will be after his upcoming urology evaluation.       Robert Bellow 03/21/2016, 2:36 PM

## 2016-03-22 ENCOUNTER — Ambulatory Visit: Payer: PPO | Admitting: General Surgery

## 2016-03-23 ENCOUNTER — Ambulatory Visit
Admission: RE | Admit: 2016-03-23 | Discharge: 2016-03-23 | Disposition: A | Discharge status: Home / Self Care | Payer: PPO | Source: Ambulatory Visit | Attending: General Surgery | Admitting: General Surgery

## 2016-03-23 DIAGNOSIS — C649 Malignant neoplasm of unspecified kidney, except renal pelvis: Secondary | ICD-10-CM | POA: Insufficient documentation

## 2016-03-23 DIAGNOSIS — N289 Disorder of kidney and ureter, unspecified: Secondary | ICD-10-CM | POA: Diagnosis not present

## 2016-03-23 DIAGNOSIS — C7800 Secondary malignant neoplasm of unspecified lung: Secondary | ICD-10-CM | POA: Insufficient documentation

## 2016-03-23 DIAGNOSIS — C801 Malignant (primary) neoplasm, unspecified: Secondary | ICD-10-CM | POA: Insufficient documentation

## 2016-03-23 MED ORDER — IOPAMIDOL (ISOVUE-300) INJECTION 61%
100.0000 mL | Freq: Once | INTRAVENOUS | Status: AC | PRN
  Administered 2016-03-23: 100 mL via INTRAVENOUS

## 2016-03-26 NOTE — Discharge Summary (Signed)
Physician Discharge Summary  Patient ID: Glen Davis MRN: [id number] DOB/AGE: 07-11-1941 75 y.o.  Admit date: 03/08/2016 Discharge date: 03/26/2016  Admission Diagnoses: Left upper lobe pulmonary nodule.   Discharge Diagnoses: Metastatic clear cell carcinoma (renal origin) to left upper lobe.  Active Problems:   * No active hospital problems. *   Discharged Condition: good  Hospital Course: The patient had a chest x-ray completed for evaluation of a melanoma. This showed a pulmonary nodule. CT scan showed multiple pulmonary nodules. Attempted CT-guided biopsy was nondiagnostic. The patient was admitted and underwent thoracoscopic biopsy of a left upper lobe lesion. No air leak noted post procedure. Maintained on suction for 24 hours then placed on waterseal. Discharged home with adequate pain control and clear cardiopulmonary exam on postoperative day 2.  Consults: None  Significant Diagnostic Studies: Pathology showed metastatic clear cell carcinoma.  Discharge Exam: Blood pressure 138/68, pulse 51, temperature 97.8 F (36.6 C), temperature source Oral, resp. rate 12, height '6\' 1"'$  (1.854 m), weight 227 lb 11.8 oz (103.3 kg), SpO2 94 %. Clear cardiopulmonary exam. Chest tube removed without incident.   Disposition: 01-Home or Self Care  Discharge Instructions    Call MD for:  difficulty breathing, headache or visual disturbances    Complete by:  As directed      Call MD for:  redness, tenderness, or signs of infection (pain, swelling, redness, odor or green/yellow discharge around incision site)    Complete by:  As directed      Call MD for:  severe uncontrolled pain    Complete by:  As directed      Call MD for:  temperature >100.4    Complete by:  As directed      Diet - low sodium heart healthy    Complete by:  As directed      Discharge instructions    Complete by:  As directed   No exertional activity. May shower. Dressing over chest tube site to remain for 3-4 days             Medication List    STOP taking these medications        Potassium Chloride ER 20 MEQ Tbcr      TAKE these medications        amLODipine 5 MG tablet  Commonly known as:  NORVASC  TAKE 1 TABLET (5 MG TOTAL) BY MOUTH ONCE DAILY-AM     aspirin 81 MG tablet  Take 81 mg by mouth daily.     atorvastatin 40 MG tablet  Commonly known as:  LIPITOR  TAKE 1 TABLET (40 MG TOTAL) BY MOUTH ONCE DAILY-AM     clopidogrel 75 MG tablet  Commonly known as:  PLAVIX  Take 75 mg by mouth daily.     hydrochlorothiazide 12.5 MG tablet  Commonly known as:  HYDRODIURIL  Take 12.5 mg by mouth daily.     HYDROcodone-acetaminophen 5-325 MG tablet  Commonly known as:  NORCO/VICODIN  Take 1-2 tablets by mouth every 4 (four) hours as needed for moderate pain.     losartan 50 MG tablet  Commonly known as:  COZAAR  Take 50 mg by mouth every morning.     metoprolol succinate 50 MG 24 hr tablet  Commonly known as:  TOPROL-XL  Take 50 mg by mouth every morning.     tamsulosin 0.4 MG Caps capsule  Commonly known as:  FLOMAX  Take 0.4 mg by mouth every morning.  Follow-up Information    Schedule an appointment as soon as possible for a visit with Anjani Feuerborn, Forest Gleason, MD.   Specialties:  General Surgery, Radiology   Why:  post op check   Contact information:   [address] [place] [address] [phone]       Signed: Robert Bellow 03/26/2016, 8:37 AM

## 2016-03-28 ENCOUNTER — Encounter: Payer: Self-pay | Admitting: Oncology

## 2016-03-28 ENCOUNTER — Inpatient Hospital Stay: Payer: PPO | Attending: Oncology | Admitting: Oncology

## 2016-03-28 VITALS — BP 125/80 | HR 56 | Temp 96.8°F | Resp 18 | Wt 218.3 lb

## 2016-03-28 DIAGNOSIS — R918 Other nonspecific abnormal finding of lung field: Secondary | ICD-10-CM

## 2016-03-28 DIAGNOSIS — C4359 Malignant melanoma of other part of trunk: Secondary | ICD-10-CM | POA: Diagnosis not present

## 2016-03-28 DIAGNOSIS — K760 Fatty (change of) liver, not elsewhere classified: Secondary | ICD-10-CM

## 2016-03-28 DIAGNOSIS — I10 Essential (primary) hypertension: Secondary | ICD-10-CM | POA: Diagnosis not present

## 2016-03-28 DIAGNOSIS — Z7982 Long term (current) use of aspirin: Secondary | ICD-10-CM | POA: Diagnosis not present

## 2016-03-28 DIAGNOSIS — Z807 Family history of other malignant neoplasms of lymphoid, hematopoietic and related tissues: Secondary | ICD-10-CM | POA: Diagnosis not present

## 2016-03-28 DIAGNOSIS — C642 Malignant neoplasm of left kidney, except renal pelvis: Secondary | ICD-10-CM

## 2016-03-28 DIAGNOSIS — Z87891 Personal history of nicotine dependence: Secondary | ICD-10-CM

## 2016-03-28 DIAGNOSIS — Z79899 Other long term (current) drug therapy: Secondary | ICD-10-CM

## 2016-03-28 DIAGNOSIS — I2510 Atherosclerotic heart disease of native coronary artery without angina pectoris: Secondary | ICD-10-CM

## 2016-03-28 DIAGNOSIS — Z87442 Personal history of urinary calculi: Secondary | ICD-10-CM | POA: Diagnosis not present

## 2016-03-29 ENCOUNTER — Encounter: Payer: Self-pay | Admitting: Urology

## 2016-03-29 ENCOUNTER — Ambulatory Visit (INDEPENDENT_AMBULATORY_CARE_PROVIDER_SITE_OTHER): Payer: PPO | Admitting: Urology

## 2016-03-29 VITALS — BP 143/86 | HR 60 | Ht 74.0 in | Wt 218.5 lb

## 2016-03-29 DIAGNOSIS — I2510 Atherosclerotic heart disease of native coronary artery without angina pectoris: Secondary | ICD-10-CM

## 2016-03-29 DIAGNOSIS — Z419 Encounter for procedure for purposes other than remedying health state, unspecified: Secondary | ICD-10-CM | POA: Diagnosis not present

## 2016-03-29 DIAGNOSIS — C642 Malignant neoplasm of left kidney, except renal pelvis: Secondary | ICD-10-CM

## 2016-03-29 DIAGNOSIS — N2889 Other specified disorders of kidney and ureter: Secondary | ICD-10-CM | POA: Diagnosis not present

## 2016-03-29 LAB — URINALYSIS, COMPLETE
Bilirubin, UA: NEGATIVE
Glucose, UA: NEGATIVE
Ketones, UA: NEGATIVE
Leukocytes, UA: NEGATIVE
NITRITE UA: NEGATIVE
PH UA: 5.5 (ref 5.0–7.5)
PROTEIN UA: NEGATIVE
Specific Gravity, UA: 1.01 (ref 1.005–1.030)
UUROB: 0.2 mg/dL (ref 0.2–1.0)

## 2016-03-29 LAB — MICROSCOPIC EXAMINATION
Epithelial Cells (non renal): NONE SEEN /hpf (ref 0–10)
WBC UA: NONE SEEN /HPF (ref 0–?)

## 2016-03-29 NOTE — Progress Notes (Signed)
03/29/2016 3:30 PM   Royston Bake Jul 28, 1941 [id number]  Referring provider: Forest Gleason, MD [address] [place], [place] [address]  Chief Complaint  Patient presents with  . 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    He was found to have multiple bilateral pulmonary nodules bilaterally as part of staging work up after excision of superficial melanoma involving right posterior shoulder.  He initially underwent needle biopsy of the left upper lobe under CT guidance which was negative.  He then underwent resection of a left pulmonary nodule by Dr. Faith Rogue on 4/20.  This was consistent with metastatic renal cell carcinoma.    Staging for this dx revealed a 4.5 cm left lower pole renal mass, enhancing on CT scan but not metabolically active on PET.  No renal vein involvement or retroperitoneal adenopathy.  No other tumor foci other than lungs.  T1bNxM1, stage IV diease.  PET scan negative other than for lungs.  Right kidney normal.  Cr 0.89.  Patient was seen and evaluated by oncology, Dr. Oliva Bustard who has made this referral.  He denies weight loss, flank pain, or gross hematuria.    No previous abdominal surgeries  Remote smoking history.  H/o CAD s/p stent in 2011 on ASA/ plavix.  He was able to stop these medications with prior surgeries without complications.    PMH: Past Medical History  Diagnosis Date  . Hypertension   . CAD (coronary artery disease)   . Cancer ([place])     left arm  . Kidney stone     Surgical History: Past Surgical History  Procedure Laterality Date  . Cardiac stents  2011    Angioplasty / Stenting Femoral-X2  . Arm surgery Left     arm  . Colonoscopy  04/01/12  . Excision melanoma with sentinel lymph node biopsy Right 01/24/2016    Procedure: EXCISION MELANOMA Right Shoulder;  Surgeon:  Robert Bellow, MD;  Location: [facility] ORS;  Service: General;  Laterality: Right;  . Coronary angioplasty    . Video assisted thoracoscopy (vats)/thorocotomy Left 03/08/2016    Procedure: VIDEO ASSISTED THORACOSCOPY (VATS) with lung biopsy - Left ;  Surgeon: Robert Bellow, MD;  Location: [facility] ORS;  Service: General;  Laterality: Left;    Home Medications:    Medication List       This list is accurate as of: 03/29/16  3:30 PM.  Always use your most recent med list.               amLODipine 5 MG tablet  Commonly known as:  NORVASC  TAKE 1 TABLET (5 MG TOTAL) BY MOUTH ONCE DAILY-AM     aspirin 81 MG tablet  Take 81 mg by mouth daily.     atorvastatin 40 MG tablet  Commonly known as:  LIPITOR  TAKE 1 TABLET (40 MG TOTAL) BY MOUTH ONCE DAILY-AM     clopidogrel 75 MG tablet  Commonly known as:  PLAVIX  Take 75 mg by mouth daily.     hydrochlorothiazide 12.5 MG tablet  Commonly known as:  HYDRODIURIL  Take 12.5 mg by mouth daily.     HYDROcodone-acetaminophen 5-325 MG tablet  Commonly known as:  NORCO/VICODIN  Take 1-2 tablets by mouth every 4 (four) hours as needed for moderate pain.     losartan  50 MG tablet  Commonly known as:  COZAAR  Take 50 mg by mouth every morning.     metoprolol succinate 50 MG 24 hr tablet  Commonly known as:  TOPROL-XL  Take 50 mg by mouth every morning.     Potassium Chloride ER 20 MEQ Tbcr  TAKE 2 TABLETS BY MOUTH TWICE A DAY UNTIL SURGERY     sertraline 25 MG tablet  Commonly known as:  ZOLOFT     tamsulosin 0.4 MG Caps capsule  Commonly known as:  FLOMAX  Take 0.4 mg by mouth every morning.        Allergies:  Allergies  Allergen Reactions  . Lisinopril Swelling  . Other Itching    Nitroglycerin Patch.    Family History: Family History  Problem Relation Age of Onset  . Hodgkin's lymphoma Daughter 5  . Kidney cancer Neg Hx   . Kidney disease Neg Hx   . Prostate cancer Neg Hx     Social History:  reports that he  quit smoking about 47 years ago. His smoking use included Cigarettes. He started smoking about 60 years ago. He does not have any smokeless tobacco history on file. He reports that he drinks alcohol. He reports that he does not use illicit drugs.  ROS: UROLOGY Frequent Urination?: No Hard to postpone urination?: No Burning/pain with urination?: No Get up at night to urinate?: No Leakage of urine?: No Urine stream starts and stops?: No Trouble starting stream?: No Do you have to strain to urinate?: No Blood in urine?: No Urinary tract infection?: No Sexually transmitted disease?: No Injury to kidneys or bladder?: No Painful intercourse?: No Weak stream?: No Erection problems?: No Penile pain?: No  Gastrointestinal Nausea?: No Vomiting?: No Indigestion/heartburn?: No Diarrhea?: No Constipation?: No  Constitutional Fever: No Night sweats?: No Weight loss?: No Fatigue?: No  Skin Skin rash/lesions?: No Itching?: No  Eyes Blurred vision?: No Double vision?: No  Ears/Nose/Throat Sore throat?: No Sinus problems?: No  Hematologic/Lymphatic Swollen glands?: No Easy bruising?: No  Cardiovascular Leg swelling?: No Chest pain?: No  Respiratory Cough?: No Shortness of breath?: No  Endocrine Excessive thirst?: No  Musculoskeletal Back pain?: No Joint pain?: No  Neurological Headaches?: No Dizziness?: No  Psychologic Depression?: No Anxiety?: No  Physical Exam: BP 143/86 mmHg  Pulse 60  Ht '6\' 2"'$  (1.88 m)  Wt 218 lb 8 oz (99.111 kg)  BMI 28.04 kg/m2  Constitutional:  Alert and oriented, No acute distress. Presents with wife today. HEENT: [place] AT, moist mucus membranes.  Trachea midline, no masses. Cardiovascular: No clubbing, cyanosis, or edema. RRR. Respiratory: Normal respiratory effort, no increased work of breathing. CTAB. GI: Abdomen is soft, nontender, nondistended, no abdominal masses.  No abdominal incision. GU: No CVA tenderness.  Skin: No  rashes, bruises or suspicious lesions.  Multiple tatoos.  Right shoulder biopsy well healed.   Lymph: No cervical or inguinal adenopathy. Neurologic: Grossly intact, no focal deficits, moving all 4 extremities. Psychiatric: Normal mood and affect.  Laboratory Data: Lab Results  Component Value Date   WBC 8.2 03/15/2016   HGB 16.3 03/15/2016   HCT 45.1 03/15/2016   MCV 91.8 03/15/2016   PLT 185 03/15/2016    Lab Results  Component Value Date   CREATININE 0.89 03/15/2016    Urinalysis Results for orders placed or performed in visit on 03/29/16  Microscopic Examination  Result Value Ref Range   WBC, UA None seen 0 -  5 /hpf   RBC, UA  0-2 0 -  2 /hpf   Epithelial Cells (non renal) None seen 0 - 10 /hpf   Bacteria, UA Few (A) None seen/Few  Urinalysis, Complete  Result Value Ref Range   Specific Gravity, UA 1.010 1.005 - 1.030   pH, UA 5.5 5.0 - 7.5   Color, UA Yellow Yellow   Appearance Ur Clear Clear   Leukocytes, UA Negative Negative   Protein, UA Negative Negative/Trace   Glucose, UA Negative Negative   Ketones, UA Negative Negative   RBC, UA Trace (A) Negative   Bilirubin, UA Negative Negative   Urobilinogen, Ur 0.2 0.2 - 1.0 mg/dL   Nitrite, UA Negative Negative   Microscopic Examination See below:     Pertinent Imaging: Study Result     CLINICAL DATA: 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  EXAM: CT ABDOMEN WITH CONTRAST  TECHNIQUE: Multidetector CT imaging of the abdomen was performed using the standard protocol following bolus administration of intravenous contrast.  CONTRAST: 136m ISOVUE-300 IOPAMIDOL (ISOVUE-300) INJECTION 61%  COMPARISON: PET-CT 02/01/2016  FINDINGS: Lower chest: Multiple bilateral pulmonary nodules consistent with more metastatic disease. No pleural or pericardial effusion.  Hepatobiliary: Mild diffuse fatty infiltration of the liver but no focal hepatic lesions or intrahepatic biliary dilatation.  The gallbladder is mildly contracted. The gallbladder is slightly irregular. Could not exclude adenomyomatosis. No common bile duct dilatation.  Pancreas: No mass, inflammation or ductal dilatation.  Spleen: Normal size. Small peripheral enhancing lesion is likely a small hemangioma.  Adrenals/Urinary Tract: The adrenal glands are normal.  The right kidney is normal except for a simple cyst.  The left kidney demonstrates a large enhancing exophytic lesion projecting off the lower pole region of the kidney consistent with renal cell carcinoma. This measured areas 4.5 x 4.2 cm. The renal vein is normal. No enlarged retroperitoneal lymph nodes are identified.  Stomach/Bowel: The stomach, duodenum, visualized small bowel and visualized colon are unremarkable. The terminal ileum is normal. The appendix is normal.  Vascular/Lymphatic: The aorta is normal in caliber. The branch vessels are normal. Single left renal artery. No mesenteric or retroperitoneal lymphadenopathy.  Other: No ascites or abdominal wall hernia.  Musculoskeletal: No worrisome bone lesions.  IMPRESSION: 1. 4.5 x 4.2 cm enhancing left renal mass consistent with renal cell carcinoma. 2. Pulmonary metastatic nodules noted at the lung bases. 3. No findings for hepatic metastatic disease or abdominal lymphadenopathy. 4. No worrisome bone lesions.   Electronically Signed  By: PMarijo SanesM.D.  On: 03/23/2016 17:36    CT scan reviewed personally today and with the patient.  Assessment & Plan: 75yo M with metastatic RCC, 4.5 cm left lower pole renal mass and biopsy proven lung mets  1. Left renal mass Discussed role of cytoreductive nephrectomy in order to prevent future symptoms as well as possibly improve overall survival when used in combinations with systemic targeted therapy.    We discussed radical vs. Partial nephrectomy.  In studies involving metastatic renal disease, cytoreductive  radical nephrectomy is currently the standard of care and the role of partial nephrectomy in this situation remains unclear.  This tumor does appear to me amenable to partial nephrectomy.  We discussed the current data including the standard of care and risk and benefits of radical vs. Partial nephrectomy.  His renal function is normal and his contralateral kidney is normal without evidence of tumors/ lesions.  Functional status is excellent.  He is most interested in laparoscopic radical nephrectomy.   We discussed the  preop, intraoperative, and post operative course.  Risks of surgery including risk of bleeding, infection, damage to surrounding structure including major blood vessels, bowel, and spleen, pain, hernia formation, MI, stroke, PE and death.  All of his questions were answered.    - CULTURE, URINE COMPREHENSIVE (preop) - Urinalysis, Complete  2. Metastatic renal cell carcinoma, left (HCC)  3. CAD in native artery History of CAD s/p stent in 2011, will need to ensure cardiac clearance to hold ASA/ plavix  Schedule left radical nephrectomy (laparoscopic hand assisted)  Hollice Espy, MD  [facility] [address] [place], [place] [address] [phone]  I spent 45 min with this patient of which greater than 50% was spent in counseling and coordination of care with the patient.

## 2016-03-30 ENCOUNTER — Telehealth: Payer: Self-pay | Admitting: Radiology

## 2016-03-30 ENCOUNTER — Encounter: Payer: Self-pay | Admitting: Urology

## 2016-03-30 NOTE — Telephone Encounter (Signed)
Notified pt of surgery scheduled 04/24/16, pre-admit testing appt on 5/23 '@9'$ :45 and to call day prior to surgery for arrival time to SDS. Advised pt to hold ASA '81mg'$  and Plavix 7 days prior to surgery beginning on 04/17/16 per Dr Saralyn Pilar. Pt voices understanding.

## 2016-04-01 LAB — CULTURE, URINE COMPREHENSIVE

## 2016-04-02 ENCOUNTER — Encounter: Payer: Self-pay | Admitting: Oncology

## 2016-04-02 NOTE — Progress Notes (Signed)
[facility] Telephone:(336) [phone]  Fax:([phone]) [place]  KARTER HELLMER OB: [birth year]  MR#: [id number]  JGO#:[id number]  Patient Care Team: Dion Body, MD as PCP - General (Family Medicine) Jannet Mantis, MD (Dermatology) Robert Bellow, MD (General Surgery)  CHIEF COMPLAINT:  Chief Complaint  Patient presents with  . 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  Status post wide excision without any residual malignancy in March of 2017\ 3.  PET scan was consistent with hypermetabolic lesion with low SUV activity in the lung.  CT scan without contrast revealed a left kidney mass Carcinoma of the kidney (clinically suspected with  Metastases  tothelung  T2NX M1 stage IV diseaseDiagnosis in April of 2017  VISIT DIAGNOSIS:     ICD-9-CM ICD-10-CM   1. Metastatic renal cell carcinoma, left (HCC) 189.0 C64.2 sertraline (ZOLOFT) 25 MG tablet     Ambulatory referral to Urology      No history exists.      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  Patient has multiple moles on the back and in initial examination revealed an abnormal MOLE.  Patient was referred to Dr. Bary Castilla by dermatologist.. Biopsy of skin lesion was consistent with malignant L unknown, patient underwent wide excision without any significant residual disease. Chest x-ray revealed pulmonary nodules.  PET scan was consistent with hypermetabolic lesion in the lung with low SUV activity.  Has multiple bilateral pulmonary nodules.  Needle biopsy of the left upper lobe under CT guidance was negative and patient underwent resection of left pulmonary nodule which was consistent with clear cell carcinoma. Patient remains asymptomatic.  No history of hematuria.   Has a history of benign prosthetic hypertrophy. Reviewing CT scan without contrast revealed left kidney mass.  Overall clinical picture was consistent with primary kidney cancer and metastases to the lung.  Patient was referred to me for further evaluation and treatment consideration Patient is here for further follow-up no hematuria no abdominal pain.  Had a CT scan of abdomen which has been reviewed independently today  REVIEW OF SYSTEMS:   GENERAL:  Feels good.  Active.  No fevers, sweats or weight loss. PERFORMANCE STATUS (ECOG):  0 HEENT:  No visual changes, runny nose, sore throat, mouth sores or tenderness. Lungs: No shortness of breath or cough.  No hemoptysis. Cardiac:  No chest pain, palpitations, orthopnea, or PND. GI:  No nausea, vomiting, diarrhea, constipation, melena or hematochezia. GU:  No urgency, frequency, dysuria, or hematuria. Musculoskeletal:  No back pain.  No joint pain.  No muscle tenderness. Extremities:  No pain or swelling. Skin:  History of malignant melanoma recently diagnosed.  History of multiple moles being followed by dermatologist Patient also has a previous history of malignant melanoma in the left upper extremity Neuro:  No headache, numbness or weakness, balance or coordination issues. Endocrine:  No diabetes, thyroid issues, hot flashes or night sweats. Psych:  No mood changes, depression or anxiety. Pain:  No focal pain. Review of systems:  All other systems reviewed and found to be negative.  As per HPI. Otherwise, a complete review of systems is negatve.  PAST MEDICAL HISTORY: Past Medical History  Diagnosis Date  . Hypertension   . CAD (coronary artery disease)   . Cancer ([place])  left arm  . Kidney stone     PAST SURGICAL HISTORY: Past Surgical History  Procedure Laterality Date  . Cardiac stents  2011    Angioplasty / Stenting Femoral-X2  . Arm surgery Left     arm  . Colonoscopy  04/01/12  . Excision melanoma with sentinel lymph  node biopsy Right 01/24/2016    Procedure: EXCISION MELANOMA Right Shoulder;  Surgeon: Robert Bellow, MD;  Location: [facility] ORS;  Service: General;  Laterality: Right;  . Coronary angioplasty    . Video assisted thoracoscopy (vats)/thorocotomy Left 03/08/2016    Procedure: VIDEO ASSISTED THORACOSCOPY (VATS) with lung biopsy - Left ;  Surgeon: Robert Bellow, MD;  Location: [facility] ORS;  Service: General;  Laterality: Left;    FAMILY HISTORY Family History  Problem Relation Age of Onset  . Hodgkin's lymphoma Daughter 5  . Kidney cancer Neg Hx   . Kidney disease Neg Hx   . Prostate cancer Neg Hx     GYNECOLOGIC HISTORY:  No LMP for male patient.     ADVANCED DIRECTIVES:    HEALTH MAINTENANCE: Social History  Substance Use Topics  . Smoking status: Former Smoker    Types: Cigarettes    Start date: 01/18/1956    Quit date: 01/17/1969  . Smokeless tobacco: None  . Alcohol Use: 0.0 oz/week    0 Standard drinks or equivalent per week     Comment: BEER OCC     The patient is getting regular colonoscopy.  Also had prostate checkup and PSA done by primary care physician  Allergies  Allergen Reactions  . Lisinopril Swelling  . Other Itching    Nitroglycerin Patch.    Current Outpatient Prescriptions  Medication Sig Dispense Refill  . amLODipine (NORVASC) 5 MG tablet TAKE 1 TABLET (5 MG TOTAL) BY MOUTH ONCE DAILY-AM  1  . aspirin 81 MG tablet Take 81 mg by mouth daily.    Marland Kitchen atorvastatin (LIPITOR) 40 MG tablet TAKE 1 TABLET (40 MG TOTAL) BY MOUTH ONCE DAILY-AM  11  . clopidogrel (PLAVIX) 75 MG tablet Take 75 mg by mouth daily.    . hydrochlorothiazide (HYDRODIURIL) 12.5 MG tablet Take 12.5 mg by mouth daily.     Marland Kitchen HYDROcodone-acetaminophen (NORCO/VICODIN) 5-325 MG tablet Take 1-2 tablets by mouth every 4 (four) hours as needed for moderate pain. (Patient not taking: Reported on 03/29/2016) 30 tablet 0  . losartan (COZAAR) 50 MG tablet Take 50 mg by mouth every morning.     .  metoprolol succinate (TOPROL-XL) 50 MG 24 hr tablet Take 50 mg by mouth every morning.     . Potassium Chloride ER 20 MEQ TBCR TAKE 2 TABLETS BY MOUTH TWICE A DAY UNTIL SURGERY  0  . sertraline (ZOLOFT) 25 MG tablet     . tamsulosin (FLOMAX) 0.4 MG CAPS capsule Take 0.4 mg by mouth every morning.      No current facility-administered medications for this visit.    OBJECTIVE: PHYSICAL EXAM:   GENERAL:  Well developed, well nourished, sitting comfortably in the exam room in no acute distress. MENTAL STATUS:  Alert and oriented to person, place and time. HEAD: .  Normocephalic, atraumatic, face symmetric, no Cushingoid features. EYES:  .  Pupils equal round and reactive to light and accomodation.  No conjunctivitis or scleral icterus. ENT:  Oropharynx clear without lesion.  Tongue normal. Mucous membranes moist.  RESPIRATORY:  Clear to auscultation without rales, wheezes or rhonchi. CARDIOVASCULAR:  Regular rate and rhythm without  murmur, rub or gallop. BREAST:  Right breast without masses, skin changes or nipple discharge.  Left breast without masses, skin changes or nipple discharge. ABDOMEN:  Soft, non-tender, with active bowel sounds, and no hepatosplenomegaly.  No masses. BACK:  No CVA tenderness.  No tenderness on percussion of the back or rib cage. SKIN:  No rashes, ulcers or lesions.  Patient has multiple moles on the back EXTREMITIES: No edema, no skin discoloration or tenderness.  No palpable cords. LYMPH NODES: No palpable cervical, supraclavicular, axillary or inguinal adenopathy  NEUROLOGICAL: Unremarkable. PSYCH:  Appropriate.  Filed Vitals:   03/28/16 1131  BP: 125/80  Pulse: 56  Temp: 96.8 F (36 C)  Resp: 18     Body mass index is 28.8 kg/(m^2).    ECOG FS:0 - Asymptomatic  LAB RESULTS:  No visits with results within 5 Day(s) from this visit. Latest known visit with results is:  Appointment on 03/15/2016  Component Date Value Ref Range Status  . WBC  03/15/2016 8.2  3.8 - 10.6 K/uL Final  . RBC 03/15/2016 4.92  4.40 - 5.90 MIL/uL Final  . Hemoglobin 03/15/2016 16.3  13.0 - 18.0 g/dL Final   RESULT REPEATED AND VERIFIED  . HCT 03/15/2016 45.1  40.0 - 52.0 % Final   RESULT REPEATED AND VERIFIED  . MCV 03/15/2016 91.8  80.0 - 100.0 fL Final  . MCH 03/15/2016 33.3  26.0 - 34.0 pg Final  . MCHC 03/15/2016 36.2* 32.0 - 36.0 g/dL Final  . RDW 03/15/2016 13.0  11.5 - 14.5 % Final  . Platelets 03/15/2016 185  150 - 440 K/uL Final  . Neutrophils Relative % 03/15/2016 69%   Final  . Neutro Abs 03/15/2016 5.7  1.4 - 6.5 K/uL Final  . Lymphocytes Relative 03/15/2016 18%   Final  . Lymphs Abs 03/15/2016 1.4  1.0 - 3.6 K/uL Final  . Monocytes Relative 03/15/2016 10%   Final  . Monocytes Absolute 03/15/2016 0.8  0.2 - 1.0 K/uL Final  . Eosinophils Relative 03/15/2016 2%   Final  . Eosinophils Absolute 03/15/2016 0.1  0 - 0.7 K/uL Final  . Basophils Relative 03/15/2016 1%   Final  . Basophils Absolute 03/15/2016 0.1  0 - 0.1 K/uL Final  . Sodium 03/15/2016 138  135 - 145 mmol/L Final  . Potassium 03/15/2016 3.8  3.5 - 5.1 mmol/L Final  . Chloride 03/15/2016 104  101 - 111 mmol/L Final  . CO2 03/15/2016 25  22 - 32 mmol/L Final  . Glucose, Bld 03/15/2016 121* 65 - 99 mg/dL Final  . BUN 03/15/2016 14  6 - 20 mg/dL Final  . Creatinine, Ser 03/15/2016 0.89  0.61 - 1.24 mg/dL Final  . Calcium 03/15/2016 9.7  8.9 - 10.3 mg/dL Final  . Total Protein 03/15/2016 7.5  6.5 - 8.1 g/dL Final  . Albumin 03/15/2016 4.6  3.5 - 5.0 g/dL Final  . AST 03/15/2016 25  15 - 41 U/L Final  . ALT 03/15/2016 23  17 - 63 U/L Final  . Alkaline Phosphatase 03/15/2016 81  38 - 126 U/L Final  . Total Bilirubin 03/15/2016 1.2  0.3 - 1.2 mg/dL Final  . GFR calc non Af Amer 03/15/2016 >60  >60 mL/min Final  . GFR calc Af Amer 03/15/2016 >60  >60 mL/min Final   Comment: (NOTE) The eGFR has been calculated using the CKD EPI equation. This calculation has not been validated in  all clinical situations. eGFR's persistently <60 mL/min signify possible Chronic  Kidney Disease.   . Anion gap 03/15/2016 9  5 - 15 Final     STUDIES: Dg Chest 1 View  03/08/2016  CLINICAL DATA:  Status post left thoracoscopy and lung biopsy today. Subsequent encounter. EXAM: CHEST 1 VIEW COMPARISON:  Single-view of the chest 02/16/2016. PA and lateral chest 01/12/2016. PET CT scan 02/01/2016. FINDINGS: The patient has a new left chest tube in place. There is no pneumothorax. Bibasilar atelectasis is seen. Small pulmonary nodules seen on prior CT scan are not visualized on this examination. Heart size is upper normal. IMPRESSION: Postoperative change left chest with a chest tube in place. Negative for pneumothorax or acute disease. Known pulmonary nodules are better seen on prior CT. Electronically Signed   By: Inge Rise M.D.   On: 03/08/2016 12:21   Dg Chest 2 View  03/19/2016  CLINICAL DATA:  Metastatic clear cell carcinoma. EXAM: CHEST  2 VIEW COMPARISON:  Chest radiograph of March 10, 2016. CT scan of January 23, 2016. FINDINGS: The heart size and mediastinal contours are within normal limits. Both lungs are clear. No pneumothorax or pleural effusion is noted. The visualized skeletal structures are unremarkable. IMPRESSION: No active cardiopulmonary disease. Pulmonary nodules seen on prior CT scan are not well visualized on this radiograph. Electronically Signed   By: Marijo Conception, M.D.   On: 03/19/2016 16:00   Ct Abdomen W Contrast  03/23/2016  CLINICAL DATA:  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 EXAM: CT ABDOMEN WITH CONTRAST TECHNIQUE: Multidetector CT imaging of the abdomen was performed using the standard protocol following bolus administration of intravenous contrast. CONTRAST:  153m ISOVUE-300 IOPAMIDOL (ISOVUE-300) INJECTION 61% COMPARISON:  PET-CT 02/01/2016 FINDINGS: Lower chest: Multiple bilateral pulmonary nodules consistent with more metastatic disease. No  pleural or pericardial effusion. Hepatobiliary: Mild diffuse fatty infiltration of the liver but no focal hepatic lesions or intrahepatic biliary dilatation. The gallbladder is mildly contracted. The gallbladder is slightly irregular. Could not exclude adenomyomatosis. No common bile duct dilatation. Pancreas: No mass, inflammation or ductal dilatation. Spleen: Normal size. Small peripheral enhancing lesion is likely a small hemangioma. Adrenals/Urinary Tract: The adrenal glands are normal. The right kidney is normal except for a simple cyst. The left kidney demonstrates a large enhancing exophytic lesion projecting off the lower pole region of the kidney consistent with renal cell carcinoma. This measured areas 4.5 x 4.2 cm. The renal vein is normal. No enlarged retroperitoneal lymph nodes are identified. Stomach/Bowel: The stomach, duodenum, visualized small bowel and visualized colon are unremarkable. The terminal ileum is normal. The appendix is normal. Vascular/Lymphatic: The aorta is normal in caliber. The branch vessels are normal. Single left renal artery. No mesenteric or retroperitoneal lymphadenopathy. Other: No ascites or abdominal wall hernia. Musculoskeletal: No worrisome bone lesions. IMPRESSION: 1. 4.5 x 4.2 cm enhancing left renal mass consistent with renal cell carcinoma. 2. Pulmonary metastatic nodules noted at the lung bases. 3. No findings for hepatic metastatic disease or abdominal lymphadenopathy. 4. No worrisome bone lesions. Electronically Signed   By: PMarijo SanesM.D.   On: 03/23/2016 17:36   Dg Chest Port 1 View  03/10/2016  CLINICAL DATA:  75year old male status post left-sided chest tube removal EXAM: PORTABLE CHEST 1 VIEW COMPARISON:  Prior chest x-ray earlier today at 5:56 a.m. FINDINGS: Interval removal of left-sided chest tube. No evidence of pneumothorax. Mild residual left basilar atelectasis. Stable cardiac and mediastinal contours. No acute osseous abnormality. IMPRESSION:  No evidence of pneumothorax following removal of left-sided  chest tube. Electronically Signed   By: Jacqulynn Cadet M.D.   On: 03/10/2016 10:21   Dg Chest Port 1 View  03/10/2016  CLINICAL DATA:  Status post lung biopsy. History of hypertension and coronary artery disease. EXAM: PORTABLE CHEST 1 VIEW COMPARISON:  03/08/2016 FINDINGS: Mild lung base atelectasis, improved from prior study. Persistent irregular interstitial thickening, chronic. No evidence of pneumonia or edema. Left chest tube is stable.  Tip projects over the aortic knob. No pneumothorax.  No convincing pleural effusion. Cardiac silhouette is normal in size. Normal mediastinal and hilar contours. IMPRESSION: 1. No pneumothorax.  Stable left chest tube. 2. Improved lung base atelectasis.  No acute findings in the lungs. Electronically Signed   By: Lajean Manes M.D.   On: 03/10/2016 07:36    ASSESSMENT:  1.  Review of CT scan is consistent with 4.2 cm kidney mass in the left kidney.  Pathology has been reviewed and CT scan has been reviewed independently today also in case conference. 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  Malignant melanoma with Breslow depth of 0.42 mm status post wide excision without any residual malignancy The scan of abdomen with contrast has been reviewed independently. Nephrectomy as previously discussed with Dr. Rayburn Ma PLAN:    Proceed with nephrectomy.  Depending on the further evaluation either observation or Sutent or Nexavar could be considered. he has been referred to an urologist I discussed situation with Dr. Ester Rink and patient and family they are in agreement with it  2.  Regarding malignant melanoma and no further therapy has been recommended Plan   has been discussed with the patient and family. Total duration of visit was 110mnutes.  50% or more time was spent in counseling patient and family regarding prognosis and options of treatment and available  resources Patient expressed understanding and was in agreement with this plan. He also understands that He can call clinic at any time with any questions, concerns, or complaints.    No matching staging information was found for the patient.  JForest Gleason MD   04/02/2016 7:59 AM

## 2016-04-06 ENCOUNTER — Telehealth: Payer: Self-pay

## 2016-04-06 DIAGNOSIS — R0781 Pleurodynia: Secondary | ICD-10-CM | POA: Diagnosis not present

## 2016-04-06 NOTE — Telephone Encounter (Signed)
Patient called with concerns about pain after his surgery on 03/08/16. He reports that he has had pain in his left side since the surgery. He states that the pain in the area has progressively gotten worse, and significantly so in the last week. He reports it is very bad with sneezing or even certain movements. He denies any fever, chills, or productive cough. Spoke with Dr Bary Castilla and the patient can be seen by him on 04/09/16. Patient scheduled to follow up on 04/09/16 at 9:00 am.

## 2016-04-09 ENCOUNTER — Ambulatory Visit: Payer: PPO | Admitting: General Surgery

## 2016-04-10 ENCOUNTER — Encounter: Payer: Self-pay | Admitting: *Deleted

## 2016-04-10 ENCOUNTER — Encounter
Admission: RE | Admit: 2016-04-10 | Discharge: 2016-04-10 | Disposition: A | Discharge status: Home / Self Care | Payer: PPO | Source: Ambulatory Visit | Attending: Urology | Admitting: Urology

## 2016-04-10 DIAGNOSIS — Z01818 Encounter for other preprocedural examination: Secondary | ICD-10-CM | POA: Diagnosis not present

## 2016-04-10 LAB — TYPE AND SCREEN
ABO/RH(D): O POS
ANTIBODY SCREEN: NEGATIVE

## 2016-04-10 LAB — PROTIME-INR
INR: 1
PROTHROMBIN TIME: 13.4 s (ref 11.4–15.0)

## 2016-04-10 LAB — BASIC METABOLIC PANEL
ANION GAP: 8 (ref 5–15)
BUN: 14 mg/dL (ref 6–20)
CALCIUM: 9.1 mg/dL (ref 8.9–10.3)
CO2: 27 mmol/L (ref 22–32)
CREATININE: 0.82 mg/dL (ref 0.61–1.24)
Chloride: 106 mmol/L (ref 101–111)
Glucose, Bld: 104 mg/dL — ABNORMAL HIGH (ref 65–99)
Potassium: 3.2 mmol/L — ABNORMAL LOW (ref 3.5–5.1)
SODIUM: 141 mmol/L (ref 135–145)

## 2016-04-10 LAB — CBC
HCT: 41.6 % (ref 40.0–52.0)
Hemoglobin: 14.9 g/dL (ref 13.0–18.0)
MCH: 33.1 pg (ref 26.0–34.0)
MCHC: 35.7 g/dL (ref 32.0–36.0)
MCV: 92.7 fL (ref 80.0–100.0)
PLATELETS: 141 10*3/uL — AB (ref 150–440)
RBC: 4.49 MIL/uL (ref 4.40–5.90)
RDW: 13.7 % (ref 11.5–14.5)
WBC: 6.8 10*3/uL (ref 3.8–10.6)

## 2016-04-10 LAB — SURGICAL PCR SCREEN
MRSA, PCR: NEGATIVE
STAPHYLOCOCCUS AUREUS: NEGATIVE

## 2016-04-10 LAB — APTT: APTT: 27 s (ref 24–36)

## 2016-04-10 NOTE — Pre-Procedure Instructions (Signed)
Faxed results of K-3.2 to Dr. Cherrie Gauze office. Will call patient if treatment needed. Will repeat AM of surgery.

## 2016-04-10 NOTE — Patient Instructions (Signed)
  Your procedure is scheduled on:6/6 Report to Day Surgery. To find out your arrival time please call [phone] between 1PM - 3PM on 6/5 Tues.  Remember: Instructions that are not followed completely may result in serious medical risk, up to and including death, or upon the discretion of your surgeon and anesthesiologist your surgery may need to be rescheduled.    __x__ 1. Do not eat food or drink liquids after midnight. No gum chewing or hard candies.     __x__ 2. No Alcohol for 24 hours before or after surgery.   ____ 3. Bring all medications with you on the day of surgery if instructed.    __x__ 4. Notify your doctor if there is any change in your medical condition     (cold, fever, infections).     Do not wear jewelry, make-up, hairpins, clips or nail polish.  Do not wear lotions, powders, or perfumes. You may wear deodorant.  Do not shave 48 hours prior to surgery. Men may shave face and neck.  Do not bring valuables to the hospital.    [facility] is not responsible for any belongings or valuables.               Contacts, dentures or bridgework may not be worn into surgery.  Leave your suitcase in the car. After surgery it may be brought to your room.  For patients admitted to the hospital, discharge time is determined by your                treatment team.   Patients discharged the day of surgery will not be allowed to drive home.   Please read over the following fact sheets that you were given:   MRSA Information and Surgical Site Infection Prevention   __x__ Take these medicines the morning of surgery with A SIP OF WATER:    1. amLODipine (NORVASC) 5 MG tablet             2.losartan (COZAAR) 50 MG tablet             3. metoprolol succinate (TOPROL-XL) 50 MG 24 hr tablet.             4.oxyCODONE-acetaminophen (PERCOCET/ROXICET) 5-325 MG tablet     ____ Fleet Enema (as directed)   _x___ Use CHG Soap as directed  ____ Use inhalers on the day of surgery  ____  Stop metformin 2 days prior to surgery    ____ Take 1/2 of usual insulin dose the night before surgery and none on the morning of surgery.   _x___ Stop Coumadin/Plavix/aspirin on 5/31  __x__ Stop Anti-inflammatories on may take pain pill or tylenol   ____ Stop supplements until after surgery.    ____ Bring C-Pap to the hospital.

## 2016-04-11 ENCOUNTER — Telehealth: Payer: Self-pay | Admitting: Radiology

## 2016-04-11 NOTE — Telephone Encounter (Signed)
Dr Erlene Quan aware of potassium level of 3.2. No orders received at this time.

## 2016-04-11 NOTE — Pre-Procedure Instructions (Signed)
Per Amy at Dr Cherrie Gauze office will not be treating K+ of 3.2. Orders for repeat DOS previously entered per anesthesia protocol.

## 2016-04-24 ENCOUNTER — Encounter
Admission: RE | Disposition: A | Discharge status: Home / Self Care | Payer: Self-pay | Source: Ambulatory Visit | Attending: Urology

## 2016-04-24 ENCOUNTER — Inpatient Hospital Stay: Payer: PPO | Admitting: Anesthesiology

## 2016-04-24 ENCOUNTER — Inpatient Hospital Stay
Admission: RE | Admit: 2016-04-24 | Discharge: 2016-04-26 | DRG: 657 | Disposition: A | Discharge status: Home / Self Care | Payer: PPO | Source: Ambulatory Visit | Attending: Urology | Admitting: Urology

## 2016-04-24 ENCOUNTER — Encounter: Payer: Self-pay | Admitting: *Deleted

## 2016-04-24 DIAGNOSIS — I2510 Atherosclerotic heart disease of native coronary artery without angina pectoris: Secondary | ICD-10-CM | POA: Diagnosis not present

## 2016-04-24 DIAGNOSIS — Z955 Presence of coronary angioplasty implant and graft: Secondary | ICD-10-CM

## 2016-04-24 DIAGNOSIS — C642 Malignant neoplasm of left kidney, except renal pelvis: Secondary | ICD-10-CM | POA: Diagnosis not present

## 2016-04-24 DIAGNOSIS — R339 Retention of urine, unspecified: Secondary | ICD-10-CM | POA: Diagnosis not present

## 2016-04-24 DIAGNOSIS — Z85820 Personal history of malignant melanoma of skin: Secondary | ICD-10-CM

## 2016-04-24 DIAGNOSIS — Z87891 Personal history of nicotine dependence: Secondary | ICD-10-CM

## 2016-04-24 DIAGNOSIS — I129 Hypertensive chronic kidney disease with stage 1 through stage 4 chronic kidney disease, or unspecified chronic kidney disease: Secondary | ICD-10-CM | POA: Diagnosis not present

## 2016-04-24 DIAGNOSIS — C7902 Secondary malignant neoplasm of left kidney and renal pelvis: Secondary | ICD-10-CM | POA: Diagnosis not present

## 2016-04-24 DIAGNOSIS — C7800 Secondary malignant neoplasm of unspecified lung: Secondary | ICD-10-CM | POA: Diagnosis present

## 2016-04-24 DIAGNOSIS — I10 Essential (primary) hypertension: Secondary | ICD-10-CM | POA: Diagnosis not present

## 2016-04-24 DIAGNOSIS — N183 Chronic kidney disease, stage 3 (moderate): Secondary | ICD-10-CM | POA: Diagnosis not present

## 2016-04-24 HISTORY — PX: LAPAROSCOPIC NEPHRECTOMY, HAND ASSISTED: SHX1929

## 2016-04-24 LAB — BASIC METABOLIC PANEL
Anion gap: 8 (ref 5–15)
BUN: 13 mg/dL (ref 6–20)
CHLORIDE: 107 mmol/L (ref 101–111)
CO2: 23 mmol/L (ref 22–32)
CREATININE: 1.16 mg/dL (ref 0.61–1.24)
Calcium: 8.7 mg/dL — ABNORMAL LOW (ref 8.9–10.3)
GFR calc Af Amer: >60 mL/min (ref >60)
GFR calc non Af Amer: >60 mL/min (ref >60)
GLUCOSE: 160 mg/dL — AB (ref 65–99)
POTASSIUM: 3.8 mmol/L (ref 3.5–5.1)
SODIUM: 138 mmol/L (ref 135–145)

## 2016-04-24 LAB — POCT I-STAT 4, (NA,K, GLUC, HGB,HCT)
Glucose, Bld: 121 mg/dL — ABNORMAL HIGH (ref 65–99)
HCT: 43 % (ref 39.0–52.0)
Hemoglobin: 14.6 g/dL (ref 13.0–17.0)
POTASSIUM: 4 mmol/L (ref 3.5–5.1)
SODIUM: 141 mmol/L (ref 135–145)

## 2016-04-24 LAB — CBC
HEMATOCRIT: 42.6 % (ref 40.0–52.0)
HEMOGLOBIN: 15.2 g/dL (ref 13.0–18.0)
MCH: 33.3 pg (ref 26.0–34.0)
MCHC: 35.6 g/dL (ref 32.0–36.0)
MCV: 93.5 fL (ref 80.0–100.0)
Platelets: 164 10*3/uL (ref 150–440)
RBC: 4.56 MIL/uL (ref 4.40–5.90)
RDW: 13.4 % (ref 11.5–14.5)
WBC: 15.8 10*3/uL — ABNORMAL HIGH (ref 3.8–10.6)

## 2016-04-24 SURGERY — NEPHRECTOMY, HAND-ASSISTED, LAPAROSCOPIC
Anesthesia: General | Laterality: Left | Wound class: Clean Contaminated

## 2016-04-24 MED ORDER — LIDOCAINE HCL (CARDIAC) 20 MG/ML IV SOLN
INTRAVENOUS | Status: DC | PRN
  Administered 2016-04-24: 60 mg via INTRAVENOUS

## 2016-04-24 MED ORDER — DIPHENHYDRAMINE HCL 12.5 MG/5ML PO ELIX: 12.5000 mg | ORAL_SOLUTION | Freq: Four times a day (QID) | ORAL | Status: DC | PRN

## 2016-04-24 MED ORDER — FAMOTIDINE 20 MG PO TABS
ORAL_TABLET | ORAL | Status: AC
  Administered 2016-04-24: 20 mg via ORAL
  Filled 2016-04-24: qty 1

## 2016-04-24 MED ORDER — FENTANYL CITRATE (PF) 100 MCG/2ML IJ SOLN
INTRAMUSCULAR | Status: AC
  Filled 2016-04-24: qty 2

## 2016-04-24 MED ORDER — SENNOSIDES-DOCUSATE SODIUM 8.6-50 MG PO TABS
2.0000 | ORAL_TABLET | Freq: Every day | ORAL | Status: DC
  Administered 2016-04-24 – 2016-04-25 (×2): 2 via ORAL
  Filled 2016-04-24 (×2): qty 2

## 2016-04-24 MED ORDER — MANNITOL 25 % IV SOLN
INTRAVENOUS | Status: AC
  Filled 2016-04-24: qty 100

## 2016-04-24 MED ORDER — TAMSULOSIN HCL 0.4 MG PO CAPS
0.4000 mg | ORAL_CAPSULE | ORAL | Status: DC
Start: 2016-04-25 — End: 2016-04-26
  Administered 2016-04-25 – 2016-04-26 (×2): 0.4 mg via ORAL
  Filled 2016-04-24 (×2): qty 1

## 2016-04-24 MED ORDER — DIPHENHYDRAMINE HCL 50 MG/ML IJ SOLN: 12.5000 mg | Freq: Four times a day (QID) | INTRAMUSCULAR | Status: DC | PRN

## 2016-04-24 MED ORDER — OXYCODONE HCL 5 MG PO TABS: 5.0000 mg | ORAL_TABLET | Freq: Once | ORAL | Status: DC | PRN

## 2016-04-24 MED ORDER — BUPIVACAINE LIPOSOME 1.3 % IJ SUSP
INTRAMUSCULAR | Status: AC
  Filled 2016-04-24: qty 20

## 2016-04-24 MED ORDER — METOPROLOL SUCCINATE ER 50 MG PO TB24
50.0000 mg | ORAL_TABLET | ORAL | Status: DC
  Administered 2016-04-25 – 2016-04-26 (×2): 50 mg via ORAL
  Filled 2016-04-24 (×2): qty 1

## 2016-04-24 MED ORDER — FENTANYL CITRATE (PF) 100 MCG/2ML IJ SOLN
INTRAMUSCULAR | Status: DC | PRN
  Administered 2016-04-24: 50 ug via INTRAVENOUS
  Administered 2016-04-24: 100 ug via INTRAVENOUS
  Administered 2016-04-24 (×2): 50 ug via INTRAVENOUS

## 2016-04-24 MED ORDER — OXYBUTYNIN CHLORIDE 5 MG PO TABS: 5.0000 mg | ORAL_TABLET | Freq: Three times a day (TID) | ORAL | Status: DC | PRN

## 2016-04-24 MED ORDER — CEFAZOLIN SODIUM-DEXTROSE 2-4 GM/100ML-% IV SOLN: 2.0000 g | Freq: Once | INTRAVENOUS | Status: DC

## 2016-04-24 MED ORDER — FAMOTIDINE 20 MG PO TABS
20.0000 mg | ORAL_TABLET | Freq: Once | ORAL | Status: AC
  Administered 2016-04-24: 20 mg via ORAL

## 2016-04-24 MED ORDER — ONDANSETRON HCL 4 MG/2ML IJ SOLN: 4.0000 mg | INTRAMUSCULAR | Status: DC | PRN

## 2016-04-24 MED ORDER — BUPIVACAINE LIPOSOME 1.3 % IJ SUSP: INTRAMUSCULAR | Status: DC | PRN

## 2016-04-24 MED ORDER — SODIUM CHLORIDE 0.9 % IV SOLN
INTRAVENOUS | Status: DC
  Administered 2016-04-24: 1000 mL via INTRAVENOUS
  Administered 2016-04-25 – 2016-04-26 (×4): via INTRAVENOUS

## 2016-04-24 MED ORDER — CEFAZOLIN SODIUM 1-5 GM-% IV SOLN
1.0000 g | Freq: Three times a day (TID) | INTRAVENOUS | Status: AC
  Administered 2016-04-24 – 2016-04-25 (×2): 1 g via INTRAVENOUS
  Filled 2016-04-24 (×4): qty 50

## 2016-04-24 MED ORDER — EPHEDRINE SULFATE 50 MG/ML IJ SOLN
INTRAMUSCULAR | Status: DC | PRN
  Administered 2016-04-24 (×3): 10 mg via INTRAVENOUS

## 2016-04-24 MED ORDER — NEOSTIGMINE METHYLSULFATE 10 MG/10ML IV SOLN
INTRAVENOUS | Status: DC | PRN
  Administered 2016-04-24: 4 mg via INTRAVENOUS

## 2016-04-24 MED ORDER — FENTANYL CITRATE (PF) 100 MCG/2ML IJ SOLN
25.0000 ug | INTRAMUSCULAR | Status: DC | PRN
  Administered 2016-04-24 (×3): 50 ug via INTRAVENOUS

## 2016-04-24 MED ORDER — HYDROMORPHONE HCL 1 MG/ML IJ SOLN
0.5000 mg | INTRAMUSCULAR | Status: DC | PRN
  Administered 2016-04-25 – 2016-04-26 (×2): 0.5 mg via INTRAVENOUS
  Filled 2016-04-24 (×2): qty 1

## 2016-04-24 MED ORDER — ACETAMINOPHEN 325 MG PO TABS: 650.0000 mg | ORAL_TABLET | ORAL | Status: DC | PRN

## 2016-04-24 MED ORDER — ROCURONIUM BROMIDE 100 MG/10ML IV SOLN
INTRAVENOUS | Status: DC | PRN
  Administered 2016-04-24 (×4): 10 mg via INTRAVENOUS
  Administered 2016-04-24: 40 mg via INTRAVENOUS
  Administered 2016-04-24: 10 mg via INTRAVENOUS

## 2016-04-24 MED ORDER — GLYCOPYRROLATE 0.2 MG/ML IJ SOLN
INTRAMUSCULAR | Status: DC | PRN
  Administered 2016-04-24: .8 mg via INTRAVENOUS

## 2016-04-24 MED ORDER — ONDANSETRON HCL 4 MG/2ML IJ SOLN
INTRAMUSCULAR | Status: DC | PRN
  Administered 2016-04-24: 4 mg via INTRAVENOUS

## 2016-04-24 MED ORDER — SODIUM CHLORIDE 0.9 % IV SOLN
INTRAVENOUS | Status: DC | PRN
  Administered 2016-04-24: 18:00:00

## 2016-04-24 MED ORDER — HEPARIN SODIUM (PORCINE) 5000 UNIT/ML IJ SOLN
5000.0000 [IU] | Freq: Three times a day (TID) | INTRAMUSCULAR | Status: DC
  Administered 2016-04-24 – 2016-04-26 (×5): 5000 [IU] via SUBCUTANEOUS
  Filled 2016-04-24 (×5): qty 1

## 2016-04-24 MED ORDER — BUPIVACAINE HCL (PF) 0.5 % IJ SOLN
INTRAMUSCULAR | Status: AC
  Filled 2016-04-24: qty 30

## 2016-04-24 MED ORDER — HYDROMORPHONE HCL 1 MG/ML IJ SOLN
INTRAMUSCULAR | Status: AC
  Administered 2016-04-24: 0.5 mg
  Filled 2016-04-24: qty 1

## 2016-04-24 MED ORDER — LOSARTAN POTASSIUM 50 MG PO TABS
50.0000 mg | ORAL_TABLET | ORAL | Status: DC
Start: 2016-04-25 — End: 2016-04-26
  Administered 2016-04-25 – 2016-04-26 (×2): 50 mg via ORAL
  Filled 2016-04-24 (×2): qty 1

## 2016-04-24 MED ORDER — THROMBIN 5000 UNITS EX SOLR
CUTANEOUS | Status: AC
  Filled 2016-04-24: qty 5000

## 2016-04-24 MED ORDER — MORPHINE SULFATE (PF) 2 MG/ML IV SOLN
2.0000 mg | INTRAVENOUS | Status: DC | PRN
  Administered 2016-04-24 (×2): 4 mg via INTRAVENOUS
  Administered 2016-04-25: 2 mg via INTRAVENOUS
  Filled 2016-04-24: qty 1
  Filled 2016-04-24: qty 2
  Filled 2016-04-24: qty 1
  Filled 2016-04-24: qty 2

## 2016-04-24 MED ORDER — BELLADONNA ALKALOIDS-OPIUM 16.2-60 MG RE SUPP: 1.0000 | Freq: Four times a day (QID) | RECTAL | Status: DC | PRN

## 2016-04-24 MED ORDER — CEFAZOLIN SODIUM-DEXTROSE 2-4 GM/100ML-% IV SOLN
INTRAVENOUS | Status: AC
  Administered 2016-04-24: 20:00:00
  Filled 2016-04-24: qty 100

## 2016-04-24 MED ORDER — HYDROCHLOROTHIAZIDE 25 MG PO TABS
12.5000 mg | ORAL_TABLET | Freq: Every day | ORAL | Status: DC
  Administered 2016-04-24 – 2016-04-26 (×3): 12.5 mg via ORAL
  Filled 2016-04-24 (×3): qty 1

## 2016-04-24 MED ORDER — PROPOFOL 10 MG/ML IV BOLUS
INTRAVENOUS | Status: DC | PRN
Start: 2016-04-24 — End: 2016-04-24
  Administered 2016-04-24: 180 mg via INTRAVENOUS

## 2016-04-24 MED ORDER — OXYCODONE-ACETAMINOPHEN 5-325 MG PO TABS
1.0000 | ORAL_TABLET | ORAL | Status: DC | PRN
  Administered 2016-04-24 – 2016-04-26 (×5): 2 via ORAL
  Filled 2016-04-24 (×5): qty 2

## 2016-04-24 MED ORDER — LACTATED RINGERS IV SOLN
INTRAVENOUS | Status: DC
  Administered 2016-04-24 (×2): via INTRAVENOUS

## 2016-04-24 MED ORDER — SODIUM CHLORIDE 0.9 % IJ SOLN
INTRAMUSCULAR | Status: AC
  Filled 2016-04-24: qty 50

## 2016-04-24 MED ORDER — OXYCODONE HCL 5 MG/5ML PO SOLN: 5.0000 mg | Freq: Once | ORAL | Status: DC | PRN

## 2016-04-24 MED ORDER — THROMBIN 5000 UNITS EX KIT
PACK | CUTANEOUS | Status: DC | PRN
  Administered 2016-04-24: 5000 [IU] via TOPICAL

## 2016-04-24 MED ORDER — ATORVASTATIN CALCIUM 20 MG PO TABS
40.0000 mg | ORAL_TABLET | Freq: Every day | ORAL | Status: DC
  Administered 2016-04-25: 40 mg via ORAL
  Filled 2016-04-24: qty 2

## 2016-04-24 MED ORDER — HYDROMORPHONE BOLUS VIA INFUSION
0.5000 mg | INTRAVENOUS | Status: DC | PRN
  Administered 2016-04-24 (×2): 0.5 mg via INTRAVENOUS

## 2016-04-24 MED ORDER — AMLODIPINE BESYLATE 5 MG PO TABS
5.0000 mg | ORAL_TABLET | Freq: Every day | ORAL | Status: DC
  Administered 2016-04-24 – 2016-04-26 (×3): 5 mg via ORAL
  Filled 2016-04-24 (×3): qty 1

## 2016-04-24 SURGICAL SUPPLY — 77 items
ANCHOR TIS RET SYS 1550ML (BAG) ×3 IMPLANT
APPLICATOR SURGIFLO (MISCELLANEOUS) IMPLANT
APPLICATOR SURGIFLO ENDO (HEMOSTASIS) ×3 IMPLANT
APPLIER CLIP ROT 10 11.4 M/L (STAPLE) ×3
APPLIER CLIP ROT 13.4 12 LRG (CLIP) ×3
CANISTER SUCT 1200ML W/VALVE (MISCELLANEOUS) ×3 IMPLANT
CATH TRAY 16F METER LATEX (MISCELLANEOUS) ×3 IMPLANT
CHLORAPREP W/TINT 26ML (MISCELLANEOUS) ×3 IMPLANT
CLEANER CAUTERY TIP 5X5 PAD (MISCELLANEOUS) ×1 IMPLANT
CLIP APPLIE ROT 10 11.4 M/L (STAPLE) ×1 IMPLANT
CLIP APPLIE ROT 13.4 12 LRG (CLIP) ×1 IMPLANT
CLIP LIGATING HEM O LOK PURPLE (MISCELLANEOUS) IMPLANT
CLOSURE WOUND 1/2 X4 (GAUZE/BANDAGES/DRESSINGS) ×1
CORD BIP STRL DISP 12FT (MISCELLANEOUS) IMPLANT
DEFOGGER SCOPE WARMER CLEARIFY (MISCELLANEOUS) ×6 IMPLANT
DISSECTOR KITTNER STICK (MISCELLANEOUS) ×1 IMPLANT
DISSECTORS/KITTNER STICK (MISCELLANEOUS) ×3
DRAPE INCISE IOBAN 66X45 STRL (DRAPES) ×3 IMPLANT
DRAPE SURG 17X11 SM STRL (DRAPES) ×12 IMPLANT
DRSG TEGADERM 2-3/8X2-3/4 SM (GAUZE/BANDAGES/DRESSINGS) IMPLANT
DRSG TEGADERM 4X4.75 (GAUZE/BANDAGES/DRESSINGS) IMPLANT
DRSG TELFA 3X8 NADH (GAUZE/BANDAGES/DRESSINGS) IMPLANT
ELECT REM PT RETURN 9FT ADLT (ELECTROSURGICAL) ×3
ELECTRODE REM PT RTRN 9FT ADLT (ELECTROSURGICAL) ×1 IMPLANT
GELPORT LAPAROSCOPIC (MISCELLANEOUS) ×3 IMPLANT
GLOVE BIO SURGEON STRL SZ 6.5 (GLOVE) ×4 IMPLANT
GLOVE BIO SURGEON STRL SZ7 (GLOVE) ×3 IMPLANT
GLOVE BIO SURGEONS STRL SZ 6.5 (GLOVE) ×2
GLOVE BIOGEL PI IND STRL 7.5 (GLOVE) ×1 IMPLANT
GLOVE BIOGEL PI INDICATOR 7.5 (GLOVE) ×2
GLOVE INDICATOR 6.5 STRL GRN (GLOVE) ×3 IMPLANT
GOWN STRL REUS W/ TWL LRG LVL3 (GOWN DISPOSABLE) ×3 IMPLANT
GOWN STRL REUS W/TWL LRG LVL3 (GOWN DISPOSABLE) ×6
GOWN STRL REUS W/TWL XL LVL4 (GOWN DISPOSABLE) ×3 IMPLANT
GRASPER SUT TROCAR 14GX15 (MISCELLANEOUS) ×3 IMPLANT
HANDLE YANKAUER SUCT BULB TIP (MISCELLANEOUS) IMPLANT
HOLDER FOLEY CATH W/STRAP (MISCELLANEOUS) ×6 IMPLANT
IRRIGATION STRYKERFLOW (MISCELLANEOUS) ×1 IMPLANT
IRRIGATOR STRYKERFLOW (MISCELLANEOUS) ×3
IV NS 1000ML (IV SOLUTION) ×2
IV NS 1000ML BAXH (IV SOLUTION) ×1 IMPLANT
KIT RM TURNOVER STRD PROC AR (KITS) ×3 IMPLANT
L-HOOK LAP DISP 36CM (ELECTROSURGICAL) ×3
LABEL OR SOLS (LABEL) IMPLANT
LHOOK LAP DISP 36CM (ELECTROSURGICAL) ×1 IMPLANT
LIGASURE LAP ATLAS 10MM 37CM (INSTRUMENTS) ×3 IMPLANT
LIQUID BAND (GAUZE/BANDAGES/DRESSINGS) ×3 IMPLANT
LOOP RED MAXI  1X406MM (MISCELLANEOUS) ×2
LOOP VESSEL MAXI 1X406 RED (MISCELLANEOUS) ×1 IMPLANT
NEEDLE HYPO 25X1 1.5 SAFETY (NEEDLE) ×3 IMPLANT
NEEDLE INSUFFLATION 14GA 120MM (NEEDLE) ×3 IMPLANT
PACK LAP CHOLECYSTECTOMY (MISCELLANEOUS) ×3 IMPLANT
PAD CLEANER CAUTERY TIP 5X5 (MISCELLANEOUS) ×2
PAD TRENDELENBURG OR TABLE (MISCELLANEOUS) ×3 IMPLANT
PENCIL ELECTRO HAND CTR (MISCELLANEOUS) ×3 IMPLANT
SCISSORS METZENBAUM CVD 33 (INSTRUMENTS) ×3 IMPLANT
SPOGE SURGIFLO 8M (HEMOSTASIS) ×2
SPONGE LAP 18X18 5 PK (GAUZE/BANDAGES/DRESSINGS) ×3 IMPLANT
SPONGE SURGIFLO 8M (HEMOSTASIS) ×1 IMPLANT
STAPLE RELOAD 2.5MM WHITE (STAPLE) ×18 IMPLANT
STAPLER SKIN PROX 35W (STAPLE) ×3 IMPLANT
STAPLER VASCULAR ECHELON 35 (CUTTER) ×3 IMPLANT
STRIP CLOSURE SKIN 1/2X4 (GAUZE/BANDAGES/DRESSINGS) ×2 IMPLANT
SURGILUBE 2OZ TUBE FLIPTOP (MISCELLANEOUS) ×3 IMPLANT
SUT CHROMIC 0 CT 1 (SUTURE) IMPLANT
SUT MNCRL AB 4-0 PS2 18 (SUTURE) ×6 IMPLANT
SUT PDS AB 0 CT1 27 (SUTURE) IMPLANT
SUT VIC AB 0 CT1 36 (SUTURE) ×6 IMPLANT
SUT VIC AB 1 CT1 36 (SUTURE) IMPLANT
SUT VIC AB 2-0 SH 27 (SUTURE) ×4
SUT VIC AB 2-0 SH 27XBRD (SUTURE) ×2 IMPLANT
SUT VIC AB 2-0 UR6 27 (SUTURE) IMPLANT
SUT VIC AB 4-0 FS2 27 (SUTURE) IMPLANT
TROCAR ENDOPATH XCEL 12X100 BL (ENDOMECHANICALS) ×3 IMPLANT
TROCAR XCEL 12X100 BLDLESS (ENDOMECHANICALS) ×6 IMPLANT
TROCAR XCEL NON-BLD 5MMX100MML (ENDOMECHANICALS) ×3 IMPLANT
TUBING INSUFFLATOR HI FLOW (MISCELLANEOUS) ×3 IMPLANT

## 2016-04-24 NOTE — Op Note (Signed)
PREOP DIAGNOSIS: Left renal mass  POSTOPERATIVE DIAGNOSIS: Left renal mass  OPERATION PERFORMED: Hand-assisted laparoscopic left radical nephrectomy.   SURGEON: Hollice Espy, MD   Assistant: Link Snuffer, PA   ANESTHESIA: General.   ESTIMATED BLOOD LOSS: 200  cc   DRAINS: 16-French Foley catheter.   COMPLICATIONS: None.  Indications: Renal cell carcinoma  FINDINGS: Hilar anatomy: Arteries: 2 Veins: 2 Ureters: 1   DESCRIPTION OF OPERATION: Informed consent was obtained. The patient was marked on the left side. IV antibiotics were given for bacterial prophylaxis on call to the operating room. SCDs were provided for DVT prophylaxis. The patient was taken to the operating room and placed supine on the operating table. General anesthesia was provided. A Foley catheter was placed to drain the bladder.  The patient was positioned in right lateral decubitus with the left flank elevated about 70 degrees and the table flexed slightly. The left arm was placed in a padded airplane for support. Axillary roll was positioned. The patient was secured to the table with soft straps and then prepped and draped sterilely.   We had a time-out confirming the patient identification, planned procedure, surgical site, and all present were in agreement. All present were in agreement.   A 8 cm incision was made for the hand port in the left lower quadrant. The anterior fascia was incised and elevated. The rectus belly was retracted medially and the peritoneum was incised. An incisional block was provided with liposomal Marcaine. The GelPort was assembled. A 12 mm trocar was placed above the umbilicus, and then the abdomen was insufflated. Laparoscopic survey revealed no abnormalities or injuries. An additional 12 mm trocar was just below  the subxiphoid. All port sites were then infiltrated with liposomal Marcaine. Zero Vicryls were placed at the 12 mm trocar sites with  the Carter-Thomason device for closure at the end of the case.   The white line of Toldt was incised. The colon was reflected from the spleen to the pelvis. Gerota's fascia was lifted up off the lower pole of the kidney and the ureter and gonadal vessels were identified. The gonadal vein was exposed and followed up to the main renal vein  A lower pole complex with pair renal artery and vein were encountered and taken en bloc. The branch point of the gonadal vein and adrenal vein were exposed at the renal vein. The upper pole of the kidney was mobilized off of the quadratus lumborum and further mobilized away from the spleen. The adrenal gland was identified and spared during the upper pole dissection. The lower pole and lateral attachments were freed. The kidney was held laterally and the hilar dissection was completed.  A single main renal artery was exposed.    The ureter was exposed, clipped distally using 12 mm clips, and divided sharply. The ureter stump was confirmed hemostatic.  At this point, the renal artery was divided with a 45 mm vascular load staple using the battery operated endovascular stapler. Next, the renal vein was divided with a second staple load.  An endocatch bag was then used to bag the specimen.  The kidney was extracted through the gel port and passed off for pathological analysis.    Pneumoperitoneal pressure was reduced to 7 mmHg and the abdomen was inspected; hemostasis was confirmed. The 12 mm trocars were removed and the port sites closed with previously placed 0 Vicryl. The Gelport was removed. The anterior fascia was closed with a running 1 PDS x 2. All the incisions  were irrigated, patted dry, and then the skin was reapproximated with 4-0 Monocryl in a subcuticular fashion. The wounds were cleaned and dried and covered with Dermabond. All sponge, needle, and instrument counts were reported correct x2.   The patient was awakened from anesthesia and  transferred to recovery in stable condition. There were no complications. The patient tolerated the procedure well. ______________________________    Hollice Espy, MD

## 2016-04-24 NOTE — H&P (View-Only) (Signed)
03/29/2016 3:30 PM   Royston Bake [birth year] [id number]  Referring provider: Forest Gleason, MD [address] [place], [place] [address]  Chief Complaint  Patient presents with  . 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    He was found to have multiple bilateral pulmonary nodules bilaterally as part of staging work up after excision of superficial melanoma involving right posterior shoulder.  He initially underwent needle biopsy of the left upper lobe under CT guidance which was negative.  He then underwent resection of a left pulmonary nodule by Dr. Faith Rogue on 4/20.  This was consistent with metastatic renal cell carcinoma.    Staging for this dx revealed a 4.5 cm left lower pole renal mass, enhancing on CT scan but not metabolically active on PET.  No renal vein involvement or retroperitoneal adenopathy.  No other tumor foci other than lungs.  T1bNxM1, stage IV diease.  PET scan negative other than for lungs.  Right kidney normal.  Cr 0.89.  Patient was seen and evaluated by oncology, Dr. Oliva Bustard who has made this referral.  He denies weight loss, flank pain, or gross hematuria.    No previous abdominal surgeries  Remote smoking history.  H/o CAD s/p stent in 2011 on ASA/ plavix.  He was able to stop these medications with prior surgeries without complications.    PMH: Past Medical History  Diagnosis Date  . Hypertension   . CAD (coronary artery disease)   . Cancer ([place])     left arm  . Kidney stone     Surgical History: Past Surgical History  Procedure Laterality Date  . Cardiac stents  2011    Angioplasty / Stenting Femoral-X2  . Arm surgery Left     arm  . Colonoscopy  04/01/12  . Excision melanoma with sentinel lymph node biopsy Right 01/24/2016    Procedure: EXCISION MELANOMA Right Shoulder;  Surgeon:  Robert Bellow, MD;  Location: [facility] ORS;  Service: General;  Laterality: Right;  . Coronary angioplasty    . Video assisted thoracoscopy (vats)/thorocotomy Left 03/08/2016    Procedure: VIDEO ASSISTED THORACOSCOPY (VATS) with lung biopsy - Left ;  Surgeon: Robert Bellow, MD;  Location: [facility] ORS;  Service: General;  Laterality: Left;    Home Medications:    Medication List       This list is accurate as of: 03/29/16  3:30 PM.  Always use your most recent med list.               amLODipine 5 MG tablet  Commonly known as:  NORVASC  TAKE 1 TABLET (5 MG TOTAL) BY MOUTH ONCE DAILY-AM     aspirin 81 MG tablet  Take 81 mg by mouth daily.     atorvastatin 40 MG tablet  Commonly known as:  LIPITOR  TAKE 1 TABLET (40 MG TOTAL) BY MOUTH ONCE DAILY-AM     clopidogrel 75 MG tablet  Commonly known as:  PLAVIX  Take 75 mg by mouth daily.     hydrochlorothiazide 12.5 MG tablet  Commonly known as:  HYDRODIURIL  Take 12.5 mg by mouth daily.     HYDROcodone-acetaminophen 5-325 MG tablet  Commonly known as:  NORCO/VICODIN  Take 1-2 tablets by mouth every 4 (four) hours as needed for moderate pain.     losartan  50 MG tablet  Commonly known as:  COZAAR  Take 50 mg by mouth every morning.     metoprolol succinate 50 MG 24 hr tablet  Commonly known as:  TOPROL-XL  Take 50 mg by mouth every morning.     Potassium Chloride ER 20 MEQ Tbcr  TAKE 2 TABLETS BY MOUTH TWICE A DAY UNTIL SURGERY     sertraline 25 MG tablet  Commonly known as:  ZOLOFT     tamsulosin 0.4 MG Caps capsule  Commonly known as:  FLOMAX  Take 0.4 mg by mouth every morning.        Allergies:  Allergies  Allergen Reactions  . Lisinopril Swelling  . Other Itching    Nitroglycerin Patch.    Family History: Family History  Problem Relation Age of Onset  . Hodgkin's lymphoma Daughter 5  . Kidney cancer Neg Hx   . Kidney disease Neg Hx   . Prostate cancer Neg Hx     Social History:  reports that he  quit smoking about 47 years ago. His smoking use included Cigarettes. He started smoking about 60 years ago. He does not have any smokeless tobacco history on file. He reports that he drinks alcohol. He reports that he does not use illicit drugs.  ROS: UROLOGY Frequent Urination?: No Hard to postpone urination?: No Burning/pain with urination?: No Get up at night to urinate?: No Leakage of urine?: No Urine stream starts and stops?: No Trouble starting stream?: No Do you have to strain to urinate?: No Blood in urine?: No Urinary tract infection?: No Sexually transmitted disease?: No Injury to kidneys or bladder?: No Painful intercourse?: No Weak stream?: No Erection problems?: No Penile pain?: No  Gastrointestinal Nausea?: No Vomiting?: No Indigestion/heartburn?: No Diarrhea?: No Constipation?: No  Constitutional Fever: No Night sweats?: No Weight loss?: No Fatigue?: No  Skin Skin rash/lesions?: No Itching?: No  Eyes Blurred vision?: No Double vision?: No  Ears/Nose/Throat Sore throat?: No Sinus problems?: No  Hematologic/Lymphatic Swollen glands?: No Easy bruising?: No  Cardiovascular Leg swelling?: No Chest pain?: No  Respiratory Cough?: No Shortness of breath?: No  Endocrine Excessive thirst?: No  Musculoskeletal Back pain?: No Joint pain?: No  Neurological Headaches?: No Dizziness?: No  Psychologic Depression?: No Anxiety?: No  Physical Exam: BP 143/86 mmHg  Pulse 60  Ht '6\' 2"'$  (1.88 m)  Wt 218 lb 8 oz (99.111 kg)  BMI 28.04 kg/m2  Constitutional:  Alert and oriented, No acute distress. Presents with wife today. HEENT: [place] AT, moist mucus membranes.  Trachea midline, no masses. Cardiovascular: No clubbing, cyanosis, or edema. RRR. Respiratory: Normal respiratory effort, no increased work of breathing. CTAB. GI: Abdomen is soft, nontender, nondistended, no abdominal masses.  No abdominal incision. GU: No CVA tenderness.  Skin: No  rashes, bruises or suspicious lesions.  Multiple tatoos.  Right shoulder biopsy well healed.   Lymph: No cervical or inguinal adenopathy. Neurologic: Grossly intact, no focal deficits, moving all 4 extremities. Psychiatric: Normal mood and affect.  Laboratory Data: Lab Results  Component Value Date   WBC 8.2 03/15/2016   HGB 16.3 03/15/2016   HCT 45.1 03/15/2016   MCV 91.8 03/15/2016   PLT 185 03/15/2016    Lab Results  Component Value Date   CREATININE 0.89 03/15/2016    Urinalysis Results for orders placed or performed in visit on 03/29/16  Microscopic Examination  Result Value Ref Range   WBC, UA None seen 0 -  5 /hpf   RBC, UA  0-2 0 -  2 /hpf   Epithelial Cells (non renal) None seen 0 - 10 /hpf   Bacteria, UA Few (A) None seen/Few  Urinalysis, Complete  Result Value Ref Range   Specific Gravity, UA 1.010 1.005 - 1.030   pH, UA 5.5 5.0 - 7.5   Color, UA Yellow Yellow   Appearance Ur Clear Clear   Leukocytes, UA Negative Negative   Protein, UA Negative Negative/Trace   Glucose, UA Negative Negative   Ketones, UA Negative Negative   RBC, UA Trace (A) Negative   Bilirubin, UA Negative Negative   Urobilinogen, Ur 0.2 0.2 - 1.0 mg/dL   Nitrite, UA Negative Negative   Microscopic Examination See below:     Pertinent Imaging: Study Result     CLINICAL DATA: 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  EXAM: CT ABDOMEN WITH CONTRAST  TECHNIQUE: Multidetector CT imaging of the abdomen was performed using the standard protocol following bolus administration of intravenous contrast.  CONTRAST: 16m ISOVUE-300 IOPAMIDOL (ISOVUE-300) INJECTION 61%  COMPARISON: PET-CT 02/01/2016  FINDINGS: Lower chest: Multiple bilateral pulmonary nodules consistent with more metastatic disease. No pleural or pericardial effusion.  Hepatobiliary: Mild diffuse fatty infiltration of the liver but no focal hepatic lesions or intrahepatic biliary dilatation.  The gallbladder is mildly contracted. The gallbladder is slightly irregular. Could not exclude adenomyomatosis. No common bile duct dilatation.  Pancreas: No mass, inflammation or ductal dilatation.  Spleen: Normal size. Small peripheral enhancing lesion is likely a small hemangioma.  Adrenals/Urinary Tract: The adrenal glands are normal.  The right kidney is normal except for a simple cyst.  The left kidney demonstrates a large enhancing exophytic lesion projecting off the lower pole region of the kidney consistent with renal cell carcinoma. This measured areas 4.5 x 4.2 cm. The renal vein is normal. No enlarged retroperitoneal lymph nodes are identified.  Stomach/Bowel: The stomach, duodenum, visualized small bowel and visualized colon are unremarkable. The terminal ileum is normal. The appendix is normal.  Vascular/Lymphatic: The aorta is normal in caliber. The branch vessels are normal. Single left renal artery. No mesenteric or retroperitoneal lymphadenopathy.  Other: No ascites or abdominal wall hernia.  Musculoskeletal: No worrisome bone lesions.  IMPRESSION: 1. 4.5 x 4.2 cm enhancing left renal mass consistent with renal cell carcinoma. 2. Pulmonary metastatic nodules noted at the lung bases. 3. No findings for hepatic metastatic disease or abdominal lymphadenopathy. 4. No worrisome bone lesions.   Electronically Signed  By: PMarijo SanesM.D.  On: 03/23/2016 17:36    CT scan reviewed personally today and with the patient.  Assessment & Plan: 75yo M with metastatic RCC, 4.5 cm left lower pole renal mass and biopsy proven lung mets  1. Left renal mass Discussed role of cytoreductive nephrectomy in order to prevent future symptoms as well as possibly improve overall survival when used in combinations with systemic targeted therapy.    We discussed radical vs. Partial nephrectomy.  In studies involving metastatic renal disease, cytoreductive  radical nephrectomy is currently the standard of care and the role of partial nephrectomy in this situation remains unclear.  This tumor does appear to me amenable to partial nephrectomy.  We discussed the current data including the standard of care and risk and benefits of radical vs. Partial nephrectomy.  His renal function is normal and his contralateral kidney is normal without evidence of tumors/ lesions.  Functional status is excellent.  He is most interested in laparoscopic radical nephrectomy.   We discussed the  preop, intraoperative, and post operative course.  Risks of surgery including risk of bleeding, infection, damage to surrounding structure including major blood vessels, bowel, and spleen, pain, hernia formation, MI, stroke, PE and death.  All of his questions were answered.    - CULTURE, URINE COMPREHENSIVE (preop) - Urinalysis, Complete  2. Metastatic renal cell carcinoma, left (HCC)  3. CAD in native artery History of CAD s/p stent in 2011, will need to ensure cardiac clearance to hold ASA/ plavix  Schedule left radical nephrectomy (laparoscopic hand assisted)  Hollice Espy, MD  [facility] [address], [place], [place] [address] [phone]  I spent 45 min with this patient of which greater than 50% was spent in counseling and coordination of care with the patient.

## 2016-04-24 NOTE — Transfer of Care (Signed)
Immediate Anesthesia Transfer of Care Note  Patient: Glen Davis  Procedure(s) Performed: Procedure(s): HAND ASSISTED LAPAROSCOPIC NEPHRECTOMY (Left)  Patient Location: PACU  Anesthesia Type:General  Level of Consciousness: awake, alert  and patient cooperative  Airway & Oxygen Therapy: Patient Spontanous Breathing and Patient connected to face mask oxygen  Post-op Assessment: Report given to RN and Post -op Vital signs reviewed and stable  Post vital signs: Reviewed and stable  Last Vitals:  Filed Vitals:   04/10/16 0959 04/24/16 1101  BP: 124/71 134/81  Pulse: 58 56  Temp:  36.7 C  Resp: 16 16    Last Pain: There were no vitals filed for this visit.       Complications: No apparent anesthesia complications

## 2016-04-24 NOTE — Anesthesia Preprocedure Evaluation (Signed)
Anesthesia Evaluation  Patient identified by MRN, date of birth, ID band Patient awake    Reviewed: Allergy & Precautions, H&P , NPO status , Patient's Chart, lab work & pertinent test results  History of Anesthesia Complications Negative for: history of anesthetic complications  Airway Mallampati: III  TM Distance: >3 FB Neck ROM: limited    Dental  (+) Poor Dentition, Chipped, Missing, Upper Dentures   Pulmonary neg shortness of breath, former smoker,    Pulmonary exam normal breath sounds clear to auscultation       Cardiovascular Exercise Tolerance: Good hypertension, (-) angina+ CAD and + Cardiac Stents  (-) Past MI and (-) DOE Normal cardiovascular exam Rhythm:regular Rate:Normal     Neuro/Psych negative neurological ROS  negative psych ROS   GI/Hepatic negative GI ROS, Neg liver ROS, neg GERD  ,  Endo/Other  negative endocrine ROS  Renal/GU Renal disease  negative genitourinary   Musculoskeletal negative musculoskeletal ROS (+)   Abdominal   Peds negative pediatric ROS (+)  Hematology negative hematology ROS (+)   Anesthesia Other Findings Past Medical History:   Hypertension                                                 CAD (coronary artery disease)                                Cancer ([place])                                                   Comment:left arm   Kidney stone                                                Past Surgical History:   cardiac stents                                   2011           Comment:Angioplasty / Stenting Femoral-X2   arm surgery                                     Left                Comment:arm   COLONOSCOPY                                      04/01/12      EXCISION MELANOMA WITH SENTINEL LYMPH NODE BIO* Right 01/24/2016       Comment:Procedure: EXCISION MELANOMA Right Shoulder;                Surgeon: Robert Bellow, MD;  Location: [facility]              ORS;   Service: General;  Laterality: Right;   CORONARY ANGIOPLASTY                                          VIDEO ASSISTED THORACOSCOPY (VATS)/THOROCOTOMY  Left 03/08/2016      Comment:Procedure: VIDEO ASSISTED THORACOSCOPY (VATS)               with lung biopsy - Left ;  Surgeon: Robert Bellow, MD;  Location: [facility] ORS;  Service:               General;  Laterality: Left;  BMI    Body Mass Index   29.03 kg/m 2    Patient has cardiac clearance for this procedure.    Reproductive/Obstetrics negative OB ROS                             Anesthesia Physical Anesthesia Plan  ASA: III  Anesthesia Plan: General ETT   Post-op Pain Management:    Induction:   Airway Management Planned:   Additional Equipment:   Intra-op Plan:   Post-operative Plan:   Informed Consent: I have reviewed the patients History and Physical, chart, labs and discussed the procedure including the risks, benefits and alternatives for the proposed anesthesia with the patient or authorized representative who has indicated his/her understanding and acceptance.   Dental Advisory Given  Plan Discussed with: Anesthesiologist, CRNA and Surgeon  Anesthesia Plan Comments:         Anesthesia Quick Evaluation

## 2016-04-24 NOTE — OR Nursing (Signed)
Patient came to OR with gold ring to left ring finger, ring was taped. Patient denied Glen Davis was on pre op,nurse in sds didn't mention ring on. Noted this was on after patient was asleep and during posistioning ,MD aware

## 2016-04-24 NOTE — Anesthesia Procedure Notes (Signed)
Procedure Name: Intubation Date/Time: 04/24/2016 3:26 PM Performed by: Dionne Bucy Pre-anesthesia Checklist: Patient identified, Patient being monitored, Timeout performed, Emergency Drugs available and Suction available Patient Re-evaluated:Patient Re-evaluated prior to inductionOxygen Delivery Method: Circle system utilized Preoxygenation: Pre-oxygenation with 100% oxygen Intubation Type: IV induction Ventilation: Mask ventilation without difficulty Laryngoscope Size: Mac and 4 Grade View: Grade I Tube type: Oral Tube size: 7.5 mm Number of attempts: 1 Airway Equipment and Method: Stylet Placement Confirmation: ETT inserted through vocal cords under direct vision,  positive ETCO2 and breath sounds checked- equal and bilateral Secured at: 22 cm Tube secured with: Tape Dental Injury: Teeth and Oropharynx as per pre-operative assessment

## 2016-04-24 NOTE — Interval H&P Note (Signed)
History and Physical Interval Note:  04/24/2016 2:23 PM  Glen Davis  has presented today for surgery, with the diagnosis of LEFT METASTATIC RENAL CELL CARCINOMA  The various methods of treatment have been discussed with the patient and family. After consideration of risks, benefits and other options for treatment, the patient has consented to  Procedure(s): HAND ASSISTED LAPAROSCOPIC NEPHRECTOMY (Left) as a surgical intervention .  The patient's history has been reviewed, patient examined, no change in status, stable for surgery.  I have reviewed the patient's chart and labs.  Questions were answered to the patient's satisfaction.     Hollice Espy

## 2016-04-25 ENCOUNTER — Telehealth: Payer: Self-pay | Admitting: Urology

## 2016-04-25 ENCOUNTER — Encounter: Payer: Self-pay | Admitting: Urology

## 2016-04-25 LAB — BASIC METABOLIC PANEL
Anion gap: 9 (ref 5–15)
BUN: 16 mg/dL (ref 6–20)
CALCIUM: 8.4 mg/dL — AB (ref 8.9–10.3)
CO2: 24 mmol/L (ref 22–32)
CREATININE: 1.37 mg/dL — AB (ref 0.61–1.24)
Chloride: 104 mmol/L (ref 101–111)
GFR calc non Af Amer: 49 mL/min — ABNORMAL LOW (ref >60)
GFR, EST AFRICAN AMERICAN: 57 mL/min — AB (ref >60)
Glucose, Bld: 144 mg/dL — ABNORMAL HIGH (ref 65–99)
Potassium: 3.7 mmol/L (ref 3.5–5.1)
Sodium: 137 mmol/L (ref 135–145)

## 2016-04-25 LAB — CBC
HEMATOCRIT: 40.3 % (ref 40.0–52.0)
Hemoglobin: 14.2 g/dL (ref 13.0–18.0)
MCH: 33.2 pg (ref 26.0–34.0)
MCHC: 35.2 g/dL (ref 32.0–36.0)
MCV: 94.3 fL (ref 80.0–100.0)
PLATELETS: 168 10*3/uL (ref 150–440)
RBC: 4.28 MIL/uL — ABNORMAL LOW (ref 4.40–5.90)
RDW: 13.7 % (ref 11.5–14.5)
WBC: 13.1 10*3/uL — ABNORMAL HIGH (ref 3.8–10.6)

## 2016-04-25 MED ORDER — DOCUSATE SODIUM 100 MG PO CAPS: 100.0000 mg | ORAL_CAPSULE | Freq: Two times a day (BID) | ORAL | Status: DC

## 2016-04-25 MED ORDER — OXYCODONE-ACETAMINOPHEN 5-325 MG PO TABS: 1.0000 | ORAL_TABLET | ORAL | Status: DC | PRN

## 2016-04-25 NOTE — Telephone Encounter (Signed)
Appt made

## 2016-04-25 NOTE — Progress Notes (Signed)
1 Day Post-Op Subjective: The patient is doing well.  No nausea or vomiting. Pain is adequately controlled.  Not yet passing flatus.   Objective: Vital signs in last 24 hours: Temp:  [97 F (36.1 C)-98.4 F (36.9 C)] 98.4 F (36.9 C) (06/07 0416) Pulse Rate:  [56-70] 64 (06/07 0416) Resp:  [12-20] 13 (06/06 2005) BP: (131-151)/(74-98) 131/82 mmHg (06/07 0416) SpO2:  [90 %-98 %] 95 % (06/07 0416) Weight:  [220 lb (99.791 kg)] 220 lb (99.791 kg) (06/06 1101)  Intake/Output from previous day: 06/06 0701 - 06/07 0700 In: 1980 [P.O.:180; I.V.:1800] Out: 1350 [Urine:1150; Blood:200] Intake/Output this shift: Total I/O In: 1417 [I.V.:1417] Out: 400 [Urine:400]  Physical Exam:  General: Alert and oriented. CV: RRR Lungs: Clear bilaterally. GI: Soft, Nondistended. Incisions: Clean and dry. Urine: Clear, Foley in place Extremities: Nontender, no erythema, no edema.  Lab Results:  Recent Labs  04/24/16 1110 04/24/16 2050 04/25/16 0430  HGB 14.6 15.2 14.2  HCT 43.0 42.6 40.3          Recent Labs  04/24/16 2050 04/25/16 0430  CREATININE 1.16 1.37*           Results for orders placed or performed during the hospital encounter of 04/24/16 (from the past 24 hour(s))  I-STAT 4, (NA,K, GLUC, HGB,HCT)     Status: Abnormal   Collection Time: 04/24/16 11:10 AM  Result Value Ref Range   Sodium 141 135 - 145 mmol/L   Potassium 4.0 3.5 - 5.1 mmol/L   Glucose, Bld 121 (H) 65 - 99 mg/dL   HCT 43.0 39.0 - 52.0 %   Hemoglobin 14.6 13.0 - 17.0 g/dL  CBC     Status: Abnormal   Collection Time: 04/24/16  8:50 PM  Result Value Ref Range   WBC 15.8 (H) 3.8 - 10.6 K/uL   RBC 4.56 4.40 - 5.90 MIL/uL   Hemoglobin 15.2 13.0 - 18.0 g/dL   HCT 42.6 40.0 - 52.0 %   MCV 93.5 80.0 - 100.0 fL   MCH 33.3 26.0 - 34.0 pg   MCHC 35.6 32.0 - 36.0 g/dL   RDW 13.4 11.5 - 14.5 %   Platelets 164 150 - 440 K/uL  Basic metabolic panel     Status: Abnormal   Collection Time: 04/24/16  8:50 PM   Result Value Ref Range   Sodium 138 135 - 145 mmol/L   Potassium 3.8 3.5 - 5.1 mmol/L   Chloride 107 101 - 111 mmol/L   CO2 23 22 - 32 mmol/L   Glucose, Bld 160 (H) 65 - 99 mg/dL   BUN 13 6 - 20 mg/dL   Creatinine, Ser 1.16 0.61 - 1.24 mg/dL   Calcium 8.7 (L) 8.9 - 10.3 mg/dL   GFR calc non Af Amer >60 >60 mL/min   GFR calc Af Amer >60 >60 mL/min   Anion gap 8 5 - 15  CBC     Status: Abnormal   Collection Time: 04/25/16  4:30 AM  Result Value Ref Range   WBC 13.1 (H) 3.8 - 10.6 K/uL   RBC 4.28 (L) 4.40 - 5.90 MIL/uL   Hemoglobin 14.2 13.0 - 18.0 g/dL   HCT 40.3 40.0 - 52.0 %   MCV 94.3 80.0 - 100.0 fL   MCH 33.2 26.0 - 34.0 pg   MCHC 35.2 32.0 - 36.0 g/dL   RDW 13.7 11.5 - 14.5 %   Platelets 168 150 - 440 K/uL  Basic metabolic panel     Status:  Abnormal   Collection Time: 04/25/16  4:30 AM  Result Value Ref Range   Sodium 137 135 - 145 mmol/L   Potassium 3.7 3.5 - 5.1 mmol/L   Chloride 104 101 - 111 mmol/L   CO2 24 22 - 32 mmol/L   Glucose, Bld 144 (H) 65 - 99 mg/dL   BUN 16 6 - 20 mg/dL   Creatinine, Ser 1.37 (H) 0.61 - 1.24 mg/dL   Calcium 8.4 (L) 8.9 - 10.3 mg/dL   GFR calc non Af Amer 49 (L) >60 mL/min   GFR calc Af Amer 57 (L) >60 mL/min   Anion gap 9 5 - 15    Assessment/Plan: POD# 1 s/p Left HA lap nephrectomy.  New baseline renal/ CKD stage III.  1) Ambulate, Incentive spirometry 2) Advance diet as tolerated 3) Transition to oral pain medication 4) D/C Foley 5) d/c home later today  Hollice Espy, MD   LOS: 1 day   Hollice Espy 04/25/2016, 9:15 AM

## 2016-04-25 NOTE — Telephone Encounter (Signed)
Please arrange post op f/u in 4 weeks with me for post op/ BMP/ CBC.  Hollice Espy, MD

## 2016-04-25 NOTE — Discharge Instructions (Signed)
·   Activity:  You are encouraged to ambulate frequently (about every hour during waking hours) to help prevent blood clots from forming in your legs or lungs.  However, you should not engage in any heavy lifting (> 5-10 lbs), strenuous activity, or straining. ° °· Diet: You should advance your diet as instructed by your physician.  It will be normal to have some bloating, nausea, and abdominal discomfort intermittently. ° °· Prescriptions:  You will be provided a prescription for pain medication to take as needed.  If your pain is not severe enough to require the prescription pain medication, you may take extra strength Tylenol instead which will have less side effects.  You should also take a prescribed stool softener to avoid straining with bowel movements as the prescription pain medication may constipate you. ° °· Incisions: You may remove your dressing bandages 48 hours after surgery if not removed in the hospital.  You will either have some small staples or special tissue glue at each of the incision sites. Once the bandages are removed (if present), the incisions may stay open to air.  You may start showering (but not soaking or bathing in water) the 2nd day after surgery and the incisions simply need to be patted dry after the shower.  No additional care is needed. ° °What to call us about: You should call the office if you develop fever > 101 or develop persistent vomiting, redness or draining around your incision, or any other concerning symptoms.   ° °[facility] °[address], [address] °[place], [place] [address] °[phone] ° ° °

## 2016-04-25 NOTE — Anesthesia Postprocedure Evaluation (Signed)
Anesthesia Post Note  Patient: Glen Davis  Procedure(s) Performed: Procedure(s) (LRB): HAND ASSISTED LAPAROSCOPIC NEPHRECTOMY (Left)  Patient location during evaluation: PACU Anesthesia Type: General Level of consciousness: awake and alert Pain management: pain level controlled Vital Signs Assessment: post-procedure vital signs reviewed and stable Respiratory status: spontaneous breathing, nonlabored ventilation, respiratory function stable and patient connected to nasal cannula oxygen Cardiovascular status: blood pressure returned to baseline and stable Postop Assessment: no signs of nausea or vomiting Anesthetic complications: no    Last Vitals:  Filed Vitals:   04/25/16 0051 04/25/16 0416  BP: 131/82 131/82  Pulse: 61 64  Temp: 36.3 C 36.9 C  Resp:      Last Pain:  Filed Vitals:   04/25/16 0611  PainSc: [place]

## 2016-04-25 NOTE — Discharge Summary (Addendum)
Date of admission: 04/24/2016  Date of discharge: 04/26/2016  Admission diagnosis: Left renal cell carcinoma  Discharge diagnosis: same, stage III CKD, urinary retention  Secondary diagnoses:  Patient Active Problem List   Diagnosis Date Noted  . Carcinoma, renal cell (HCC) 04/24/2016  . Metastatic renal cell carcinoma (HCC) 03/21/2016  . 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 CAD in native artery 03/16/2016  . Essential (primary) hypertension 03/16/2016  . Hypertriglyceridemia 03/16/2016  . Left thyroid nodule 02/03/2016  . Pulmonary nodules/lesions, multiple 02/03/2016  . Other specified counseling 01/30/2016  . 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 Melanoma of skin (HCC) 01/12/2016  . Breathlessness on exertion 04/25/2015    History and Physical: For full details, please see admission history and physical. Briefly, Glen Davis is a 75 y.o. year old patient with Lungs to presents for left radical nephrectomy for debulking.   Hospital Course: Patient tolerated the procedure well.  Glen Davis was then transferred to the floor after an uneventful PACU stay.  His hospital course was uncomplicated.  On POD#1 , His Foley catheter was removed but Glen Davis was unable to void. His catheter was replaced overnight and Glen Davis successfully passed a voiding trial on postop day 2. On postop day 2, Glen Davis had met discharge criteria: was eating a regular diet, passing flatus, was up and ambulating independently,  pain was well controlled, was voiding without a catheter, and was ready to for discharge.  Physical Exam  Constitutional: Glen Davis is oriented to person, place, and time. Glen Davis appears well-developed and well-nourished.  HENT:  Head: Normocephalic and atraumatic.  Cardiovascular: Normal rate.   Pulmonary/Chest: Effort normal and breath sounds normal.  Abdominal: Soft. Bowel sounds are normal. Glen Davis exhibits no distension. There is no tenderness.  Wounds c/d/i.  Genitourinary: Penis normal.   Neurological: Glen Davis is alert and oriented to person, place, and time.  Skin: Skin is warm and dry.  Vitals reviewed.    Laboratory values:   Recent Labs  04/24/16 1110 04/24/16 2050 04/25/16 0430  WBC  --  15.8* 13.1*  HGB 14.6 15.2 14.2  HCT 43.0 42.6 40.3    Recent Labs  04/24/16 1110 04/24/16 2050 04/25/16 0430  NA 141 138 137  K 4.0 3.8 3.7  CL  --  107 104  CO2  --  23 24  GLUCOSE 121* 160* 144*  BUN  --  13 16  CREATININE  --  1.16 1.37*  CALCIUM  --  8.7* 8.4*   No results for input(s): LABPT, INR in the last 72 hours. No results for input(s): LABURIN in the last 72 hours. Results for orders placed or performed during the hospital encounter of 04/24/16  Surgical pcr screen     Status: None   Collection Time: 04/10/16 10:55 AM  Result Value Ref Range Status   MRSA, PCR NEGATIVE NEGATIVE Final   Staphylococcus aureus NEGATIVE NEGATIVE Final    Comment:        The Xpert SA Assay (FDA approved for NASAL specimens in patients over 74 years of age), is one component of a comprehensive surveillance program.  Test performance has been validated by [facility] for patients greater than or equal to 49 year old. It is not intended to diagnose infection nor to guide or monitor treatment.     Disposition: Home  Discharge instruction: The patient was instructed to be ambulatory but told to refrain from heavy lifting, strenuous activity, or driving while taking narcotics  Discharge  medications:   Medication List    TAKE these medications        amLODipine 5 MG tablet  Commonly known as:  NORVASC  Take 5 mg by mouth daily.     aspirin EC 81 MG tablet  Take 81 mg by mouth daily.     atorvastatin 40 MG tablet  Commonly known as:  LIPITOR  Take 40 mg by mouth daily.     clopidogrel 75 MG tablet  Commonly known as:  PLAVIX  Take 75 mg by mouth daily.     docusate sodium 100 MG capsule  Commonly known as:  COLACE  Take 1 capsule (100 mg total) by  mouth 2 (two) times daily.     hydrochlorothiazide 12.5 MG tablet  Commonly known as:  HYDRODIURIL  Take 12.5 mg by mouth daily.     losartan 50 MG tablet  Commonly known as:  COZAAR  Take 50 mg by mouth every morning.     metoprolol succinate 50 MG 24 hr tablet  Commonly known as:  TOPROL-XL  Take 50 mg by mouth every morning.     oxyCODONE-acetaminophen 5-325 MG tablet  Commonly known as:  PERCOCET/ROXICET  Take 1 tablet by mouth every 4 (four) hours as needed for severe pain.     oxyCODONE-acetaminophen 5-325 MG tablet  Commonly known as:  PERCOCET  Take 1-2 tablets by mouth every 4 (four) hours as needed for moderate pain or severe pain.     tamsulosin 0.4 MG Caps capsule  Commonly known as:  FLOMAX  Take 0.4 mg by mouth every morning.       OK to resume ASA/ Plavix next week- discussed with patient  Followup:      Follow-up Information    Follow up with Hollice Espy, MD In 4 weeks.   Specialty:  Urology   Contact information:   [address] [place] [address] [phone]

## 2016-04-25 NOTE — Plan of Care (Signed)
Problem: Pain Managment: Goal: General experience of comfort will improve Outcome: Progressing Patient arrived on floor rating pain 15/10.  Patient given alternating dosage of Morphine and Oxycodone per physician orders in [place].  Pain controlled and patient was able to rest comfortably and rated pain 3/10 when awake.

## 2016-04-26 LAB — BASIC METABOLIC PANEL
ANION GAP: 5 (ref 5–15)
BUN: 18 mg/dL (ref 6–20)
CALCIUM: 8.5 mg/dL — AB (ref 8.9–10.3)
CO2: 26 mmol/L (ref 22–32)
CREATININE: 1.45 mg/dL — AB (ref 0.61–1.24)
Chloride: 102 mmol/L (ref 101–111)
GFR, EST AFRICAN AMERICAN: 53 mL/min — AB (ref >60)
GFR, EST NON AFRICAN AMERICAN: 46 mL/min — AB (ref >60)
Glucose, Bld: 109 mg/dL — ABNORMAL HIGH (ref 65–99)
Potassium: 3.3 mmol/L — ABNORMAL LOW (ref 3.5–5.1)
Sodium: 133 mmol/L — ABNORMAL LOW (ref 135–145)

## 2016-04-26 LAB — CBC
HEMATOCRIT: 35.8 % — AB (ref 40.0–52.0)
Hemoglobin: 12.9 g/dL — ABNORMAL LOW (ref 13.0–18.0)
MCH: 34.1 pg — ABNORMAL HIGH (ref 26.0–34.0)
MCHC: 36 g/dL (ref 32.0–36.0)
MCV: 94.6 fL (ref 80.0–100.0)
PLATELETS: 138 10*3/uL — AB (ref 150–440)
RBC: 3.79 MIL/uL — ABNORMAL LOW (ref 4.40–5.90)
RDW: 13.9 % (ref 11.5–14.5)
WBC: 8.9 10*3/uL (ref 3.8–10.6)

## 2016-04-26 LAB — SURGICAL PATHOLOGY

## 2016-04-26 NOTE — Progress Notes (Signed)
Patient discharged to home as ordered. Patient voided 500 cc of clear yellow urine. Denies pain at this time no acute distress noted. Patient son and wife at the bedside. Follow up appointment made with Dr. Erlene Quan and given to patient as ordered.

## 2016-04-27 MED FILL — Thrombin For Soln 5000 Unit: CUTANEOUS | Qty: 5000 | Status: AC

## 2016-04-30 ENCOUNTER — Telehealth: Payer: Self-pay

## 2016-04-30 DIAGNOSIS — Z419 Encounter for procedure for purposes other than remedying health state, unspecified: Secondary | ICD-10-CM

## 2016-04-30 MED ORDER — HYDROCODONE-ACETAMINOPHEN 5-325 MG PO TABS: 1.0000 | ORAL_TABLET | Freq: Four times a day (QID) | ORAL | Status: DC | PRN

## 2016-04-30 NOTE — Telephone Encounter (Signed)
Pt wife called stating the percocet given after surgery has made pt very sick to his stomach. Sick was described as nausea, no vomiting, severe lower abd cramps and constipation that "makes things worse". Wife stated previously pt was given darvacet for pain and wanted to know if he could have that instead. Please advise.

## 2016-04-30 NOTE — Telephone Encounter (Signed)
Spoke with pt and made aware of darvacet and vicodin orders. Pt voiced understanding. A script was printed and up front.

## 2016-04-30 NOTE — Telephone Encounter (Signed)
I have never used this medication and I believe it was taken off the market in 2010 due to cardiac issues. We could try Vicodin to see that the little better. Have Larene Beach read a prescription for #20 tablets.  Hollice Espy, MD

## 2016-05-03 ENCOUNTER — Telehealth: Payer: Self-pay | Admitting: Urology

## 2016-05-03 NOTE — Telephone Encounter (Signed)
Pathology reviewed today in tumor board. 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  Patient has follow-up appointment with oncology later this month to discuss possibly of initiation of TKI.  He will follow up with [place] at 4 weeks post op for wound check.    Hollice Espy, MD

## 2016-05-07 ENCOUNTER — Telehealth: Payer: Self-pay | Admitting: Urology

## 2016-05-07 NOTE — Telephone Encounter (Signed)
Pt took his last Vicodin this morning and needs another prescription.  [phone] [name], spouse

## 2016-05-07 NOTE — Telephone Encounter (Signed)
Please advise 

## 2016-05-07 NOTE — Telephone Encounter (Signed)
He is now almost 2 weeks out from surgery and needs to wean himself off narcotics.  Try extra strength tylenol.  Stay away from NSAIDS.   Hollice Espy, MD

## 2016-05-08 NOTE — Telephone Encounter (Signed)
Patient notified, he verbalized understanding and will try the tylenol/ SW

## 2016-05-10 ENCOUNTER — Telehealth: Payer: Self-pay

## 2016-05-10 NOTE — Telephone Encounter (Signed)
Patient's wife called stating patient is still having pain and the extra strength tylenol is not helping, patient is also trying a heating pad. Patient wants to know if this is normal post op for 2 weeks out? Can anything else be done to help the pain, patient states he is having mid back/ left flank pain. Please advise

## 2016-05-10 NOTE — Telephone Encounter (Signed)
Not normal,typically most people stop narcotics within a few days.  Have him come in tomorrow for eval.  Hollice Espy, MD

## 2016-05-11 ENCOUNTER — Ambulatory Visit (INDEPENDENT_AMBULATORY_CARE_PROVIDER_SITE_OTHER): Payer: PPO | Admitting: Urology

## 2016-05-11 ENCOUNTER — Encounter: Payer: Self-pay | Admitting: Urology

## 2016-05-11 VITALS — BP 130/76 | HR 60 | Ht 74.0 in | Wt 208.0 lb

## 2016-05-11 DIAGNOSIS — IMO0002 Reserved for concepts with insufficient information to code with codable children: Secondary | ICD-10-CM

## 2016-05-11 DIAGNOSIS — C642 Malignant neoplasm of left kidney, except renal pelvis: Secondary | ICD-10-CM

## 2016-05-11 DIAGNOSIS — Q600 Renal agenesis, unilateral: Secondary | ICD-10-CM

## 2016-05-11 DIAGNOSIS — G8918 Other acute postprocedural pain: Secondary | ICD-10-CM

## 2016-05-11 NOTE — Progress Notes (Signed)
10:49 AM  05/11/16  Glen Davis 09/15/1941 [id number]  Referring provider: Dion Body, MD [place] [facility] [place], [place] [address]  Chief Complaint  Patient presents with  . Post-op Problem    HPI:  75 year old male with metastatic RCC to the lungs (multiple bilateral) s/p cytoreductive left nephrectomy.  He was initally found to have multiple bilateral pulmonary nodules bilaterally as part of staging work up after excision of superficial melanoma involving right posterior shoulder.  He initially underwent needle biopsy of the left upper lobe under CT guidance which was negative.  He then underwent resection of a left pulmonary nodule by Dr. Faith Rogue on 4/20.  This was consistent with metastatic renal cell carcinoma.    Staging for this dx revealed a 4.5 cm left lower pole renal mass, enhancing on CT scan but not metabolically active on PET.  No renal vein involvement or retroperitoneal adenopathy.  No other tumor foci other than lungs.  T1bNxM1, stage IV diease.  PET scan negative other than for lungs.  Right kidney normal.  Cr 0.89.  He is taken to the operating room on 04/24/2016 for left hand-assisted laparoscopic radical nephrectomy. He tolerated the procedure well. He was discharged on postop day 2 and his postoperative course was, catered only by some initial urinary retention which resolve  on posto prior to discharge.   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  Today, he returns to the office sooner than scheduled due to pain control issues. He is called on several occasions requesting additional narcotics and given the was almost 3 weeks ago,  I have asked to assess him further in the office.    He reports that he has not had severe pain in a day or 2. His pain is mostly in his upper back anything sits likely positional or back pain unrelated to his surgery.    No issues with his incisions. No fevers or  chills.  No urinary issues.     PMH: Past Medical History  Diagnosis Date  . Hypertension   . CAD (coronary artery disease)   . Cancer ([place])     left arm  . Kidney stone     Surgical History: Past Surgical History  Procedure Laterality Date  . Cardiac stents  2011    Angioplasty / Stenting Femoral-X2  . Arm surgery Left     arm  . Colonoscopy  04/01/12  . Excision melanoma with sentinel lymph node biopsy Right 01/24/2016    Procedure: EXCISION MELANOMA Right Shoulder;  Surgeon: Robert Bellow, MD;  Location: [facility] ORS;  Service: General;  Laterality: Right;  . Coronary angioplasty    . Video assisted thoracoscopy (vats)/thorocotomy Left 03/08/2016    Procedure: VIDEO ASSISTED THORACOSCOPY (VATS) with lung biopsy - Left ;  Surgeon: Robert Bellow, MD;  Location: [facility] ORS;  Service: General;  Laterality: Left;  . Laparoscopic nephrectomy, hand assisted Left 04/24/2016    Procedure: HAND ASSISTED LAPAROSCOPIC NEPHRECTOMY;  Surgeon: Hollice Espy, MD;  Location: [facility] ORS;  Service: Urology;  Laterality: Left;    Home Medications:    Medication List       This list is accurate as of: 05/11/16 10:49 AM.  Always use your most recent med list.               amLODipine 5 MG tablet  Commonly known as:  NORVASC  Take 5 mg by mouth daily.  aspirin EC 81 MG tablet  Take 81 mg by mouth daily.     atorvastatin 40 MG tablet  Commonly known as:  LIPITOR  Take 40 mg by mouth daily.     clopidogrel 75 MG tablet  Commonly known as:  PLAVIX  Take 75 mg by mouth daily.     docusate sodium 100 MG capsule  Commonly known as:  COLACE  Take 1 capsule (100 mg total) by mouth 2 (two) times daily.     hydrochlorothiazide 12.5 MG tablet  Commonly known as:  HYDRODIURIL  Take 12.5 mg by mouth daily.     losartan 50 MG tablet  Commonly known as:  COZAAR  Take 50 mg by mouth every morning.     metoprolol succinate 50 MG 24 hr tablet  Commonly known as:  TOPROL-XL  Take 50 mg by  mouth every morning.     tamsulosin 0.4 MG Caps capsule  Commonly known as:  FLOMAX  Take 0.4 mg by mouth every morning.        Allergies:  Allergies  Allergen Reactions  . Lisinopril Swelling  . Other Itching    Nitroglycerin Patch.    Family History: Family History  Problem Relation Age of Onset  . Hodgkin's lymphoma Daughter 5  . Kidney cancer Neg Hx   . Kidney disease Neg Hx   . Prostate cancer Neg Hx     Social History:  reports that he quit smoking about 47 years ago. His smoking use included Cigarettes. He started smoking about 60 years ago. He has never used smokeless tobacco. He reports that he drinks alcohol. He reports that he does not use illicit drugs.  ROS: UROLOGY Frequent Urination?: No Hard to postpone urination?: No Burning/pain with urination?: No Get up at night to urinate?: No Leakage of urine?: No Urine stream starts and stops?: No Trouble starting stream?: No Do you have to strain to urinate?: No Blood in urine?: No Urinary tract infection?: No Sexually transmitted disease?: No Injury to kidneys or bladder?: No Painful intercourse?: No Weak stream?: No Erection problems?: No Penile pain?: No  Gastrointestinal Nausea?: No Vomiting?: No Indigestion/heartburn?: No Diarrhea?: No Constipation?: No  Constitutional Fever: No Night sweats?: No Weight loss?: No Fatigue?: No  Skin Skin rash/lesions?: No Itching?: No  Eyes Blurred vision?: No Double vision?: No  Ears/Nose/Throat Sore throat?: No Sinus problems?: No  Hematologic/Lymphatic Swollen glands?: No Easy bruising?: No  Cardiovascular Leg swelling?: No Chest pain?: No  Respiratory Cough?: No Shortness of breath?: No  Endocrine Excessive thirst?: No  Musculoskeletal Back pain?: Yes Joint pain?: No  Neurological Headaches?: No Dizziness?: No  Psychologic Depression?: No Anxiety?: No  Physical Exam: BP 130/76 mmHg  Pulse 60  Ht '6\' 2"'$  (1.88 m)  Wt  208 lb (94.348 kg)  BMI 26.69 kg/m2  Constitutional:  Alert and oriented, No acute distress. Presents with wife today. HEENT: [place] AT, moist mucus membranes.  Trachea midline, no masses. Cardiovascular: No clubbing, cyanosis, or edema.  Respiratory: Normal respiratory effort, no increased work of breathing. GI: Abdomen is soft, nontender, nondistended, no abdominal masses.  No abdominal incision. GU: No CVA tenderness.  Left abd incision healed well without surrounding erythema or drainage.   Skin: No rashes, bruises or suspicious lesions.  Multiple tatoos.  Neurologic: Grossly intact, no focal deficits, moving all 4 extremities. Psychiatric: Normal mood and affect.  Laboratory Data: Lab Results  Component Value Date   WBC 8.9 04/26/2016   HGB 12.9* 04/26/2016  HCT 35.8* 04/26/2016   MCV 94.6 04/26/2016   PLT 138* 04/26/2016    Lab Results  Component Value Date   CREATININE 1.45* 04/26/2016     Assessment & Plan: 75 yo M with metastatic RCC, 4.5 cm left lower pole renal mass s/p cytoreductive nephrectomy   1. Metastatic renal cell carcinoma, left (HCC) S/p uncomplicated L cytoreductive nephrectomy pT3a RCC Followed by cancer center, will likely start TKI in near future  2. Post op pain Likely MSK, no concerning post op issues identified  3. Solitary kidney Solitary kidney precautions reviewed, avoidance of dehydration    Hollice Espy, MD  [facility] [address], [place], [place] [address] [phone]

## 2016-05-11 NOTE — Telephone Encounter (Signed)
Spoke to patient and notified him of this information and transferred him to the front to make an apt this morning at 10:30

## 2016-05-18 ENCOUNTER — Inpatient Hospital Stay: Payer: PPO | Attending: Internal Medicine | Admitting: Internal Medicine

## 2016-05-18 VITALS — BP 122/72 | HR 61 | Temp 96.1°F | Resp 18 | Wt 208.8 lb

## 2016-05-18 DIAGNOSIS — Z87891 Personal history of nicotine dependence: Secondary | ICD-10-CM | POA: Insufficient documentation

## 2016-05-18 DIAGNOSIS — Z87442 Personal history of urinary calculi: Secondary | ICD-10-CM | POA: Insufficient documentation

## 2016-05-18 DIAGNOSIS — C642 Malignant neoplasm of left kidney, except renal pelvis: Secondary | ICD-10-CM | POA: Insufficient documentation

## 2016-05-18 DIAGNOSIS — C7800 Secondary malignant neoplasm of unspecified lung: Secondary | ICD-10-CM | POA: Insufficient documentation

## 2016-05-18 DIAGNOSIS — Z7982 Long term (current) use of aspirin: Secondary | ICD-10-CM | POA: Diagnosis not present

## 2016-05-18 DIAGNOSIS — I10 Essential (primary) hypertension: Secondary | ICD-10-CM | POA: Diagnosis not present

## 2016-05-18 DIAGNOSIS — Z79899 Other long term (current) drug therapy: Secondary | ICD-10-CM

## 2016-05-18 MED ORDER — PAZOPANIB HCL 200 MG PO TABS: 800.0000 mg | ORAL_TABLET | Freq: Every day | ORAL | Status: DC

## 2016-05-18 NOTE — Assessment & Plan Note (Signed)
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 I reviewed the images of the CT scan myself and the patient in detail. I discussed the stage-stage IV; usually incurable; however long remissions are possible especially with the addition of immunotherapy.   I discussed multiple options for renal cell cancer- including tyrosine kinase inhibitor-Votrient versus sunitinib- as frontline therapy options; along with multiple further lines of treatment options. We'll start the patient on Votrient. Discussed regarding diarrhea elevated blood pressure mucositis and liver function abnormalities with the medication; along with drug interactions. Prescription was given; patient not to start until next visit in 2 weeks. We will get baseline labs CBC CMP and LDH at that time also recommend a CT scan of the chest on contrast for baseline imaging  # The above plan of care was discussed the patient and family in detail. All questions were answered.

## 2016-05-18 NOTE — Progress Notes (Signed)
Votrient teaching performed with patient today while patient in clinic. Consent for Votrient signed by patient.  Patient provided with instructions on votrient. How to administer medication and mediation side effects.Teach back process performed.  Spent approximatley 15 minutes with patient in teaching back chemo medication.  He understands that rx will be sent to [facility]. [facility] will reach out to the patient after benefits are verified.

## 2016-05-18 NOTE — Patient Instructions (Signed)
Pazopanib oral tablets What is this medicine? PAZOPANIB (paz [place] pa nib) is a biologic drug used to treat kidney cancer and sarcoma. This medicine may be used for other purposes; ask your health care provider or pharmacist if you have questions. What should I tell my health care provider before I take this medicine? They need to know if you have any of these conditions: -bleeding problems -have had recent surgery (within 7 days) or are having surgery -heart disease -high blood pressure -history of stroke -liver disease -protein in your urine -stomach or intestine problems -thyroid problems -an unusual or allergic reaction to pazopanib, other medicines, foods, dyes, or preservatives -pregnant or trying to get pregnant -breast-feeding How should I use this medicine? Take this medicine by mouth with a glass of water. Follow the directions on the prescription label. Do not cut, crush or chew this medicine. Take this medicine on an empty stomach, at least 1 hour before or 2 hours after meals. Do not take with food. Do not take with grapefruit juice. Take your medicine at regular intervals. Do not take it more often than directed. Do not stop taking except on your doctor's advice. A special MedGuide will be given to you by the pharmacist with each prescription and refill. Be sure to read this information carefully each time. Talk to your pediatrician regarding the use of this medicine in children. Special care may be needed. Overdosage: If you think you have taken too much of this medicine contact a poison control center or emergency room at once. NOTE: This medicine is only for you. Do not share this medicine with others. What if I miss a dose? If you miss a dose, take it as soon as you can. If your next dose is to be taken in less than 12 hours, then do not take the missed dose. Take the next dose at your regular time. Do not take double or extra doses. What may interact with this medicine? Do  not take this medication with any of the following medications: -rifampin This medicine may also interact with the following medications: -certain medicines for stomach problems like cimetidine, famotidine, omeprazole, lansoprazole -clarithromycin -dextromethorphan -grapefruit or grapefruit juice -ketoconazole -lapatinib -certain medicines for irregular heart beat like amiodarone, bepridil, dofetilide, encainide, flecainide, propafenone, quinidine -midazolam -paclitaxel -ritonavir This list may not describe all possible interactions. Give your health care provider a list of all the medicines, herbs, non-prescription drugs, or dietary supplements you use. Also tell them if you smoke, drink alcohol, or use illegal drugs. Some items may interact with your medicine. What should I watch for while using this medicine? Visit your doctor for regular check ups. You will need to have blood work while you are taking this medicine. Call your doctor or health care professional for advice if you get a fever, chills or sore throat, or other symptoms of a cold or flu. Do not treat yourself. This drug decreases your body's ability to fight infections. Try to avoid being around people who are sick. This medicine may increase your risk to bruise or bleed. Call your doctor or health care professional if you notice any unusual bleeding. Do not become pregnant while taking this medicine or for 2 weeks after stopping it. Women should inform their doctor if they wish to become pregnant or think they might be pregnant. There is a potential for serious side effects to an unborn child. Talk to your health care professional or pharmacist for more information. Do not breast-feed an  infant while taking this medicine. This medicine may interfere with the ability to have a child. You should talk with your doctor or health care professional if you are concerned about your fertility. If you are going to have surgery or any other  procedures, tell your doctor you are taking this medicine. What side effects may I notice from receiving this medicine? Side effects that you should report to your doctor or health care professional as soon as possible: -abdominal pain -allergic reactions like skin rash, itching or hives, swelling of the face, lips, or tongue -black, tarry stool -breathing problems -changes in vision -chest pain -confusion, trouble speaking or understanding -cough -fast, irregular heartbeat -fever or chills, sore throat -seizures -severe headaches -shortness or breath -sudden numbness or weakness of the face, arm or leg -trouble passing urine or change in the amount of urine -trouble walking, dizziness, loss of balance or coordination -unusual bleeding or bruising -unusually weak or tired -yellowing of the eyes or skin Side effects that usually do not require medical attention (Report these to your doctor or health care professional if they continue or are bothersome.): -change in hair color -loose or watery stool -loss of appetite -nausea, vomiting -unusually weak or tired This list may not describe all possible side effects. Call your doctor for medical advice about side effects. You may report side effects to FDA at 1-800-FDA-1088. Where should I keep my medicine? Keep out of the reach of children. Store at room temperature between 15 and 30 degrees C (59 and 86 degrees F). Throw away any unused medicine after the expiration date. NOTE: This sheet is a summary. It may not cover all possible information. If you have questions about this medicine, talk to your doctor, pharmacist, or health care provider.    2016, Elsevier/Gold Standard. (2015-04-14 22:23:01)

## 2016-05-18 NOTE — Progress Notes (Signed)
[place] OFFICE PROGRESS NOTE  Patient Care Team: Dion Body, MD as PCP - General (Family Medicine) Jannet Mantis, MD (Dermatology) Robert Bellow, MD (General Surgery) Hollice Espy, MD as Consulting Physician (Urology)  No matching staging information was found for the patient.   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 I reviewed the patient's prior charts/pertinent labs/imaging in detail; findings are summarized above.    Glen Davis 75 y.o.  male pleasant patient above history of Newly diagnosed renal cell cancer status post left nephrectomy is here for follow-up . Patient is healing well from surgery. Denies any significant pain.  He is fairly active. Denies any unusual shortness of breath or cough. Denies any nausea vomiting. Denies any chest pain.   REVIEW OF SYSTEMS:  A complete 10 point review of system is done which is negative except mentioned above/history of present illness.   PAST MEDICAL HISTORY :  Past Medical History  Diagnosis Date  . Hypertension   . CAD (coronary artery disease)   . Cancer ([place])     left arm  . Kidney stone     PAST SURGICAL HISTORY :   Past Surgical History  Procedure Laterality Date  . Cardiac stents  2011    Angioplasty / Stenting Femoral-X2  . Arm surgery Left     arm  . Colonoscopy  04/01/12  . Excision melanoma with sentinel lymph  node biopsy Right 01/24/2016    Procedure: EXCISION MELANOMA Right Shoulder;  Surgeon: Robert Bellow, MD;  Location: [facility];  Service: General;  Laterality: Right;  . Coronary angioplasty    . Video assisted thoracoscopy (vats)/thorocotomy Left 03/08/2016    Procedure: VIDEO ASSISTED THORACOSCOPY (VATS) with lung biopsy - Left ;  Surgeon: Robert Bellow, MD;  Location: [facility];  Service: General;  Laterality: Left;  . Laparoscopic nephrectomy, hand assisted Left 04/24/2016    Procedure: HAND ASSISTED LAPAROSCOPIC NEPHRECTOMY;  Surgeon: Hollice Espy, MD;  Location: [facility];  Service: Urology;  Laterality: Left;    FAMILY HISTORY :   Family History  Problem Relation Age of Onset  . Hodgkin's lymphoma Daughter 5  . Kidney cancer Neg Hx   . Kidney disease Neg Hx   . Prostate cancer Neg Hx     SOCIAL HISTORY:   Social History  Substance Use Topics  . Smoking status: Former Smoker    Types: Cigarettes    Start date: 01/18/1956    Quit date: 01/17/1969  . Smokeless tobacco: Never Used  . Alcohol Use: 0.0 oz/week    0 Standard drinks or equivalent per week     Comment: BEER OCC    ALLERGIES:  is allergic to lisinopril and other.  MEDICATIONS:  Current Outpatient Prescriptions  Medication Sig Dispense Refill  . amLODipine (NORVASC) 5 MG tablet Take 5 mg by mouth daily.    Marland Kitchen aspirin EC 81 MG tablet Take 81 mg by mouth daily.    Marland Kitchen  atorvastatin (LIPITOR) 40 MG tablet Take 40 mg by mouth daily.    . clopidogrel (PLAVIX) 75 MG tablet Take 75 mg by mouth daily.    Marland Kitchen docusate sodium (COLACE) 100 MG capsule Take 1 capsule (100 mg total) by mouth 2 (two) times daily. 60 capsule 0  . hydrochlorothiazide (HYDRODIURIL) 12.5 MG tablet Take 12.5 mg by mouth daily.     Marland Kitchen losartan (COZAAR) 50 MG tablet Take 50 mg by mouth every morning.     . metoprolol succinate (TOPROL-XL) 50 MG 24 hr tablet Take 50 mg by mouth every morning.     . tamsulosin (FLOMAX) 0.4 MG CAPS capsule Take 0.4 mg by  mouth every morning.     . pazopanib (VOTRIENT) 200 MG tablet Take 4 tablets (800 mg total) by mouth daily. Take on an empty stomach. 120 tablet 3   No current facility-administered medications for this visit.    PHYSICAL EXAMINATION: ECOG PERFORMANCE STATUS: 0 - Asymptomatic  BP 122/72 mmHg  Pulse 61  Temp(Src) 96.1 F (35.6 C) (Tympanic)  Resp 18  Wt 208 lb 12.4 oz (94.7 kg)  Filed Weights   05/18/16 1501  Weight: 208 lb 12.4 oz (94.7 kg)    GENERAL: Well-nourished well-developed; Alert, no distress and comfortable. Accompanied by wife and daughter. EYES: no pallor or icterus OROPHARYNX: no thrush or ulceration; NECK: supple, no masses felt LYMPH:  no palpable lymphadenopathy in the cervical, axillary or inguinal regions LUNGS: clear to auscultation and  No wheeze or crackles HEART/CVS: regular rate & rhythm and no murmurs; No lower extremity edema ABDOMEN:abdomen soft, non-tender and normal bowel sounds Musculoskeletal:no cyanosis of digits and no clubbing  PSYCH: alert & oriented x 3 with fluent speech NEURO: no focal motor/sensory deficits SKIN:  no rashes or significant lesions; nephrectomy incisions healing well.  LABORATORY DATA:  I have reviewed the data as listed    Component Value Date/Time   NA 133* 04/26/2016 0428   K 3.3* 04/26/2016 0428   CL 102 04/26/2016 0428   CO2 26 04/26/2016 0428   GLUCOSE 109* 04/26/2016 0428   BUN 18 04/26/2016 0428   CREATININE 1.45* 04/26/2016 0428   CALCIUM 8.5* 04/26/2016 0428   PROT 7.5 03/15/2016 1010   ALBUMIN 4.6 03/15/2016 1010   AST 25 03/15/2016 1010   ALT 23 03/15/2016 1010   ALKPHOS 81 03/15/2016 1010   BILITOT 1.2 03/15/2016 1010   GFRNONAA 46* 04/26/2016 0428   GFRAA 53* 04/26/2016 0428    No results found for: SPEP, UPEP  Lab Results  Component Value Date   WBC 8.9 04/26/2016   NEUTROABS 5.7 03/15/2016   HGB 12.9* 04/26/2016   HCT 35.8* 04/26/2016   MCV 94.6 04/26/2016   PLT 138* 04/26/2016       Chemistry      Component Value Date/Time   NA 133* 04/26/2016 0428   K 3.3* 04/26/2016 0428   CL 102 04/26/2016 0428   CO2 26 04/26/2016 0428   BUN 18 04/26/2016 0428   CREATININE 1.45* 04/26/2016 0428      Component Value Date/Time   CALCIUM 8.5* 04/26/2016 0428   ALKPHOS 81 03/15/2016 1010   AST 25 03/15/2016 1010   ALT 23 03/15/2016 1010   BILITOT 1.2 03/15/2016 1010     1. 4.5 x 4.2 cm enhancing left renal mass consistent with renal cell carcinoma. 2. Pulmonary metastatic nodules noted at the lung bases. 3. No findings for hepatic metastatic disease or abdominal lymphadenopathy. 4. No worrisome  bone lesions.  RADIOGRAPHIC STUDIES: I have personally reviewed the radiological images as listed and agreed with the findings in the report. No results found.   ASSESSMENT & PLAN:  Metastatic renal cell carcinoma (HCC) # Left kidney cancer metastatic to the lung- status post cytoreductive left nephrectomy. I reviewed the images of the CT scan myself and the patient in detail. I discussed the stage-stage IV; usually incurable; however long remissions are possible especially with the addition of immunotherapy.   I discussed multiple options for renal cell cancer- including tyrosine kinase inhibitor-Votrient versus sunitinib- as frontline therapy options; along with multiple further lines of treatment options. We'll start the patient on Votrient. Discussed regarding diarrhea elevated blood pressure mucositis and liver function abnormalities with the medication; along with drug interactions. Prescription was given; patient not to start until next visit in 2 weeks. We will get baseline labs CBC CMP and LDH at that time also recommend a CT scan of the chest on contrast for baseline imaging  # The above plan of care was discussed the patient and family in detail. All questions were answered.     Orders Placed This Encounter  Procedures  . CT CHEST WO CONTRAST    Standing Status:  Future     Number of Occurrences:      Standing Expiration Date: 07/18/2017    Order Specific Question:  Reason for Exam (SYMPTOM  OR DIAGNOSIS REQUIRED)    Answer:  renal cell cancer mets to lung.    Order Specific Question:  Preferred imaging location?    Answer:  [facility]  . Comprehensive metabolic panel    Standing Status: Future     Number of Occurrences:      Standing Expiration Date: 06/22/2017  . CBC with Differential/Platelet    Standing Status: Future     Number of Occurrences:      Standing Expiration Date: 06/22/2017  . Lactate dehydrogenase    Standing Status: Future     Number of Occurrences:      Standing Expiration Date: 06/22/2017   All questions were answered. The patient knows to call the clinic with any problems, questions or concerns.      Cammie Sickle, MD 05/18/2016 5:34 PM

## 2016-05-25 ENCOUNTER — Ambulatory Visit (INDEPENDENT_AMBULATORY_CARE_PROVIDER_SITE_OTHER): Payer: PPO | Admitting: Urology

## 2016-05-25 ENCOUNTER — Encounter: Payer: Self-pay | Admitting: Urology

## 2016-05-25 VITALS — BP 131/79 | HR 59 | Ht 73.0 in | Wt 209.0 lb

## 2016-05-25 DIAGNOSIS — Q600 Renal agenesis, unilateral: Secondary | ICD-10-CM

## 2016-05-25 DIAGNOSIS — IMO0002 Reserved for concepts with insufficient information to code with codable children: Secondary | ICD-10-CM

## 2016-05-25 DIAGNOSIS — C642 Malignant neoplasm of left kidney, except renal pelvis: Secondary | ICD-10-CM | POA: Diagnosis not present

## 2016-05-25 DIAGNOSIS — G8918 Other acute postprocedural pain: Secondary | ICD-10-CM

## 2016-05-25 NOTE — Progress Notes (Signed)
5:25 PM  05/11/16  Glen Davis [birth year] [id number]  Referring provider: Dion Body, MD [facility] [place], [place] [address]  Chief Complaint  Patient presents with  . Routine Post Op    4wk post op    HPI:  75 year old male with metastatic RCC to the lungs (multiple bilateral) s/p cytoreductive left nephrectomy.  He returns today for 1 month routine f/u.  He was initally found to have multiple bilateral pulmonary nodules bilaterally as part of staging work up after excision of superficial melanoma involving right posterior shoulder.  He initially underwent needle biopsy of the left upper lobe under CT guidance which was negative.  He then underwent resection of a left pulmonary nodule by Dr. Faith Rogue on 4/20.  This was consistent with metastatic renal cell carcinoma.    Staging for this dx revealed a 4.5 cm left lower pole renal mass, enhancing on CT scan but not metabolically active on PET.  No renal vein involvement or retroperitoneal adenopathy.  No other tumor foci other than lungs.  T1bNxM1, stage IV diease.  PET scan negative other than for lungs.  Right kidney normal.  Cr 0.89.  He is taken to the operating room on 04/24/2016 for left hand-assisted laparoscopic radical nephrectomy. He tolerated the procedure well. He was discharged on postop day 2 and his postoperative course was, complicated only by some initial urinary retention which resolved prior to discharge.   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  He has no complains today and no further issues with pain.   PMH: Past Medical History  Diagnosis Date  . Hypertension   . CAD (coronary artery disease)   . Cancer ([place])     left arm  . Kidney stone     Surgical History: Past Surgical History  Procedure Laterality Date  . Cardiac stents  2011    Angioplasty / Stenting Femoral-X2  . Arm surgery Left     arm  . Colonoscopy  04/01/12  .  Excision melanoma with sentinel lymph node biopsy Right 01/24/2016    Procedure: EXCISION MELANOMA Right Shoulder;  Surgeon: Robert Bellow, MD;  Location: [facility];  Service: General;  Laterality: Right;  . Coronary angioplasty    . Video assisted thoracoscopy (vats)/thorocotomy Left 03/08/2016    Procedure: VIDEO ASSISTED THORACOSCOPY (VATS) with lung biopsy - Left ;  Surgeon: Robert Bellow, MD;  Location: [facility] OR[facility];  Service: General;  Laterality: Left;  . Laparoscopic nephrectomy, hand assisted Left 04/24/2016    Procedure: HAND ASSISTED LAPAROSCOPIC NEPHRECTOMY;  Surgeon: Hollice Espy, MD;  Location: [facility] OR[facility];  Service: Urology;  Laterality: Left;    Home Medications:    Medication List       This list is accurate as of: 05/25/16  5:25 PM.  Always use your most recent med list.               amLODipine 5 MG tablet  Commonly known as:  NORVASC  Take 5 mg by mouth daily.     aspirin EC 81 MG tablet  Take 81 mg by mouth daily.     atorvastatin 40 MG tablet  Commonly known as:  LIPITOR  Take 40 mg by mouth daily.     clopidogrel 75 MG tablet  Commonly known as:  PLAVIX  Take 75 mg by mouth daily.     docusate sodium 100 MG capsule  Commonly known as:  COLACE  Take 1 capsule (100 mg total) by mouth 2 (two) times daily.     hydrochlorothiazide 12.5 MG tablet  Commonly known as:  HYDRODIURIL  Take 12.5 mg by mouth daily.     losartan 50 MG tablet  Commonly known as:  COZAAR  Take 50 mg by mouth every morning.     metoprolol succinate 50 MG 24 hr tablet  Commonly known as:  TOPROL-XL  Take 50 mg by mouth every morning.     pazopanib 200 MG tablet  Commonly known as:  VOTRIENT  Take 4 tablets (800 mg total) by mouth daily. Take on an empty stomach.     tamsulosin 0.4 MG Caps capsule  Commonly known as:  FLOMAX  Take 0.4 mg by mouth every morning.        Allergies:  Allergies  Allergen Reactions  . Lisinopril Swelling  . Other Itching    Nitroglycerin  Patch.    Family History: Family History  Problem Relation Age of Onset  . Hodgkin's lymphoma Daughter 5  . Kidney cancer Neg Hx   . Kidney disease Neg Hx   . Prostate cancer Neg Hx     Social History:  reports that he quit smoking about 47 years ago. His smoking use included Cigarettes. He started smoking about 60 years ago. He has never used smokeless tobacco. He reports that he drinks alcohol. He reports that he does not use illicit drugs.  ROS: UROLOGY Frequent Urination?: No Hard to postpone urination?: No Burning/pain with urination?: No Get up at night to urinate?: No Leakage of urine?: No Urine stream starts and stops?: No Trouble starting stream?: No Do you have to strain to urinate?: No Blood in urine?: No Urinary tract infection?: No Sexually transmitted disease?: No Injury to kidneys or bladder?: No Painful intercourse?: No Weak stream?: No Erection problems?: No Penile pain?: No  Gastrointestinal Nausea?: No Vomiting?: No Indigestion/heartburn?: No Diarrhea?: No Constipation?: No  Constitutional Fever: No Night sweats?: No Weight loss?: No Fatigue?: No  Skin Skin rash/lesions?: No Itching?: No  Eyes Blurred vision?: No Double vision?: No  Ears/Nose/Throat Sore throat?: No Sinus problems?: No  Hematologic/Lymphatic Swollen glands?: No Easy bruising?: No  Cardiovascular Leg swelling?: No Chest pain?: No  Respiratory Cough?: No Shortness of breath?: No  Endocrine Excessive thirst?: No  Musculoskeletal Back pain?: No Joint pain?: No  Neurological Headaches?: No Dizziness?: No  Psychologic Depression?: No Anxiety?: No  Physical Exam: BP 131/79 mmHg  Pulse 59  Ht '6\' 1"'$  (1.854 m)  Wt 209 lb (94.802 kg)  BMI 27.58 kg/m2  Constitutional:  Alert and oriented, No acute distress. Presents with wife today. HEENT: [place] AT, moist mucus membranes.  Trachea midline, no masses. Cardiovascular: No clubbing, cyanosis, or edema.    Respiratory: Normal respiratory effort, no increased work of breathing. GI: Abdomen is soft, nontender, nondistended, no abdominal masses.  No abdominal incision. GU: No CVA tenderness.  Left abd incision healed well. Skin: No rashes, bruises or suspicious lesions.  Multiple tatoos.  Neurologic: Grossly intact, no focal deficits, moving all 4 extremities. Psychiatric: Normal mood and affect.  Laboratory Data: Lab Results  Component Value Date   WBC 8.9 04/26/2016   HGB 12.9* 04/26/2016   HCT 35.8* 04/26/2016   MCV 94.6 04/26/2016   PLT 138* 04/26/2016    Lab Results  Component Value Date   CREATININE 1.45* 04/26/2016     Assessment & Plan: 75 yo M with metastatic RCC, 4.5 cm left lower pole renal  mass s/p cytoreductive nephrectomy   1. Metastatic renal cell carcinoma, left (HCC) S/p uncomplicated L cytoreductive nephrectomy pT3a RCC Followed by cancer center, will likely start TKI in near future BMP/ CBC today  2. Post op pain Resolved  3. Solitary kidney Solitary kidney precautions reviewed, avoidance of dehydration and nephrotoxic agents  Will continue to be followed at the cancer center    Hollice Espy, [place] [address], [place], [place] [address] [phone]

## 2016-05-26 LAB — BASIC METABOLIC PANEL
BUN/Creatinine Ratio: 14 (ref 10–24)
BUN: 19 mg/dL (ref 8–27)
CHLORIDE: 103 mmol/L (ref 96–106)
CO2: 20 mmol/L (ref 18–29)
Calcium: 9.2 mg/dL (ref 8.6–10.2)
Creatinine, Ser: 1.33 mg/dL — ABNORMAL HIGH (ref 0.76–1.27)
GFR, EST AFRICAN AMERICAN: 60 mL/min/{1.73_m2} (ref >59)
GFR, EST NON AFRICAN AMERICAN: 52 mL/min/{1.73_m2} — AB (ref >59)
Glucose: 85 mg/dL (ref 65–99)
POTASSIUM: 4.2 mmol/L (ref 3.5–5.2)
Sodium: 146 mmol/L — ABNORMAL HIGH (ref 134–144)

## 2016-05-26 LAB — CBC
HEMOGLOBIN: 13.9 g/dL (ref 12.6–17.7)
Hematocrit: 38.7 % (ref 37.5–51.0)
MCH: 33.5 pg — ABNORMAL HIGH (ref 26.6–33.0)
MCHC: 35.9 g/dL — ABNORMAL HIGH (ref 31.5–35.7)
MCV: 93 fL (ref 79–97)
Platelets: 177 10*3/uL (ref 150–379)
RBC: 4.15 x10E6/uL (ref 4.14–5.80)
RDW: 13.6 % (ref 12.3–15.4)
WBC: 6 10*3/uL (ref 3.4–10.8)

## 2016-05-28 ENCOUNTER — Telehealth: Payer: Self-pay | Admitting: *Deleted

## 2016-05-28 NOTE — Telephone Encounter (Signed)
Called patient and LVM for him to come by office and sign necessary paperwork to send to [employer] for patient assistance foundation.  Also requested that patient bring proof of income with him when he comes.

## 2016-05-29 ENCOUNTER — Ambulatory Visit
Admission: RE | Admit: 2016-05-29 | Discharge: 2016-05-29 | Disposition: A | Discharge status: Home / Self Care | Payer: PPO | Source: Ambulatory Visit | Attending: Internal Medicine | Admitting: Internal Medicine

## 2016-05-29 ENCOUNTER — Other Ambulatory Visit: Payer: Self-pay | Admitting: *Deleted

## 2016-05-29 DIAGNOSIS — I2510 Atherosclerotic heart disease of native coronary artery without angina pectoris: Secondary | ICD-10-CM | POA: Insufficient documentation

## 2016-05-29 DIAGNOSIS — I700 Atherosclerosis of aorta: Secondary | ICD-10-CM | POA: Insufficient documentation

## 2016-05-29 DIAGNOSIS — Z9889 Other specified postprocedural states: Secondary | ICD-10-CM | POA: Diagnosis not present

## 2016-05-29 DIAGNOSIS — C642 Malignant neoplasm of left kidney, except renal pelvis: Secondary | ICD-10-CM

## 2016-05-29 DIAGNOSIS — C7800 Secondary malignant neoplasm of unspecified lung: Secondary | ICD-10-CM | POA: Insufficient documentation

## 2016-05-29 DIAGNOSIS — C3490 Malignant neoplasm of unspecified part of unspecified bronchus or lung: Secondary | ICD-10-CM | POA: Diagnosis not present

## 2016-05-29 DIAGNOSIS — J439 Emphysema, unspecified: Secondary | ICD-10-CM | POA: Insufficient documentation

## 2016-05-30 NOTE — Telephone Encounter (Signed)
Contacted patient"s wife asked her to have patient  come in to complete his patient financial assistance forms for Votrient. He has not yet returned our call. Forms are still on my desk for patient to complete. He must bring proof of income when completing these documents. She will bring the information to appt. On Friday.

## 2016-05-30 NOTE — Telephone Encounter (Signed)
Glen Davis, Please continue to reach patient to come in to complete his patient financial assistance forms for Votrient. He has not yet returned our call.  Forms are still on my desk for patient to complete. He must bring proof of income when completing these documents.

## 2016-05-30 NOTE — Telephone Encounter (Signed)
-----   Message from Cammie Sickle, MD sent at 05/29/2016  7:13 PM EDT ----- Nira Conn-- any update on votrient availability to pt? Thx

## 2016-06-01 ENCOUNTER — Inpatient Hospital Stay: Payer: PPO

## 2016-06-01 ENCOUNTER — Inpatient Hospital Stay: Payer: PPO | Attending: Internal Medicine | Admitting: Internal Medicine

## 2016-06-01 VITALS — BP 123/83 | HR 62 | Temp 98.1°F | Resp 18 | Wt 205.9 lb

## 2016-06-01 DIAGNOSIS — Z87442 Personal history of urinary calculi: Secondary | ICD-10-CM | POA: Insufficient documentation

## 2016-06-01 DIAGNOSIS — Z79899 Other long term (current) drug therapy: Secondary | ICD-10-CM | POA: Insufficient documentation

## 2016-06-01 DIAGNOSIS — I2510 Atherosclerotic heart disease of native coronary artery without angina pectoris: Secondary | ICD-10-CM | POA: Insufficient documentation

## 2016-06-01 DIAGNOSIS — C642 Malignant neoplasm of left kidney, except renal pelvis: Secondary | ICD-10-CM

## 2016-06-01 DIAGNOSIS — I10 Essential (primary) hypertension: Secondary | ICD-10-CM | POA: Insufficient documentation

## 2016-06-01 DIAGNOSIS — Z7982 Long term (current) use of aspirin: Secondary | ICD-10-CM

## 2016-06-01 DIAGNOSIS — C7800 Secondary malignant neoplasm of unspecified lung: Secondary | ICD-10-CM | POA: Diagnosis not present

## 2016-06-01 DIAGNOSIS — Z87891 Personal history of nicotine dependence: Secondary | ICD-10-CM | POA: Insufficient documentation

## 2016-06-01 DIAGNOSIS — J439 Emphysema, unspecified: Secondary | ICD-10-CM | POA: Diagnosis not present

## 2016-06-01 DIAGNOSIS — C4359 Malignant melanoma of other part of trunk: Secondary | ICD-10-CM | POA: Insufficient documentation

## 2016-06-01 LAB — COMPREHENSIVE METABOLIC PANEL
ALBUMIN: 4.5 g/dL (ref 3.5–5.0)
ALT: 20 U/L (ref 17–63)
ANION GAP: 6 (ref 5–15)
AST: 23 U/L (ref 15–41)
Alkaline Phosphatase: 85 U/L (ref 38–126)
BILIRUBIN TOTAL: 0.8 mg/dL (ref 0.3–1.2)
BUN: 22 mg/dL — AB (ref 6–20)
CHLORIDE: 102 mmol/L (ref 101–111)
CO2: 27 mmol/L (ref 22–32)
Calcium: 9.6 mg/dL (ref 8.9–10.3)
Creatinine, Ser: 1.5 mg/dL — ABNORMAL HIGH (ref 0.61–1.24)
GFR calc Af Amer: 51 mL/min — ABNORMAL LOW (ref >60)
GFR, EST NON AFRICAN AMERICAN: 44 mL/min — AB (ref >60)
Glucose, Bld: 109 mg/dL — ABNORMAL HIGH (ref 65–99)
POTASSIUM: 3.4 mmol/L — AB (ref 3.5–5.1)
Sodium: 135 mmol/L (ref 135–145)
TOTAL PROTEIN: 7.5 g/dL (ref 6.5–8.1)

## 2016-06-01 LAB — CBC WITH DIFFERENTIAL/PLATELET
BASOS ABS: 0 10*3/uL (ref 0–0.1)
Eosinophils Absolute: 0.1 10*3/uL (ref 0–0.7)
HEMATOCRIT: 40.6 % (ref 40.0–52.0)
Hemoglobin: 14.7 g/dL (ref 13.0–18.0)
Lymphocytes Relative: 21 %
Lymphs Abs: 1.6 10*3/uL (ref 1.0–3.6)
MCH: 32.9 pg (ref 26.0–34.0)
MCHC: 36.2 g/dL — AB (ref 32.0–36.0)
MCV: 90.9 fL (ref 80.0–100.0)
MONO ABS: 0.6 10*3/uL (ref 0.2–1.0)
Neutro Abs: 5.4 10*3/uL (ref 1.4–6.5)
Neutrophils Relative %: 70 %
PLATELETS: 176 10*3/uL (ref 150–440)
RBC: 4.46 MIL/uL (ref 4.40–5.90)
RDW: 13.3 % (ref 11.5–14.5)
WBC: 7.8 10*3/uL (ref 3.8–10.6)

## 2016-06-01 LAB — LACTATE DEHYDROGENASE: LDH: 192 U/L (ref 98–192)

## 2016-06-01 NOTE — Assessment & Plan Note (Signed)
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 Stage IV; CT lung- increasing in the size of the lung nodules.   # Recommend starting Votrient 200 mg 4 pills a day. Patient signed paperwork/co-pay assistance today.   # Again reviewed the side effects in detail/ patient agrees to start when available.  # Recommend checking LFTs in 3 weeks; follow up with me in 5 weeks with labs CBC CMP.   I reviewed the images myself and with the patient and family in detail. A copy of this report was given.

## 2016-06-01 NOTE — Progress Notes (Signed)
[facility] OFFICE PROGRESS NOTE  Patient Care Team: Dion Body, MD as PCP - General (Family Medicine) Jannet Mantis, MD (Dermatology) Robert Bellow, MD (General Surgery) Hollice Espy, MD as Consulting Physician (Urology)  No matching staging information was found for the patient.   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  male pleasant patient above history of Newly diagnosed renal cell cancer status post left nephrectomy/ Is here to review the results of his CT scan of the chest for evaluation of lung nodules.  He is fairly active. Denies any unusual shortness of breath or cough. Denies any nausea vomiting. Denies any chest pain. As per the family has been more anxious recently.   REVIEW OF SYSTEMS:  A complete 10 point review of system is done which is negative except mentioned above/history of present illness.   PAST MEDICAL HISTORY :  Past Medical History  Diagnosis Date  . Hypertension   . CAD (coronary artery disease)   . Cancer ([place])     left arm  . Kidney stone     PAST SURGICAL HISTORY :   Past Surgical History  Procedure Laterality Date  . Cardiac stents  2011    Angioplasty / Stenting Femoral-X2  . Arm surgery Left     arm  . Colonoscopy  04/01/12  . Excision  melanoma with sentinel lymph node biopsy Right 01/24/2016    Procedure: EXCISION MELANOMA Right Shoulder;  Surgeon: Robert Bellow, MD;  Location: [facility] ORS;  Service: General;  Laterality: Right;  . Coronary angioplasty    . Video assisted thoracoscopy (vats)/thorocotomy Left 03/08/2016    Procedure: VIDEO ASSISTED THORACOSCOPY (VATS) with lung biopsy - Left ;  Surgeon: Robert Bellow, MD;  Location: [facility] ORS;  Service: General;  Laterality: Left;  . Laparoscopic nephrectomy, hand assisted Left 04/24/2016    Procedure: HAND ASSISTED LAPAROSCOPIC NEPHRECTOMY;  Surgeon: Hollice Espy, MD;  Location: [facility] ORS;  Service: Urology;  Laterality: Left;    FAMILY HISTORY :   Family History  Problem Relation Age of Onset  . Hodgkin's lymphoma Daughter 5  . Kidney cancer Neg Hx   . Kidney disease Neg Hx   . Prostate cancer Neg Hx     SOCIAL HISTORY:   Social History  Substance Use Topics  . Smoking status: Former Smoker    Types: Cigarettes    Start date: 01/18/1956    Quit date: 01/17/1969  . Smokeless tobacco: Never Used  . Alcohol Use: 0.0 oz/week    0 Standard drinks or equivalent per week     Comment: BEER OCC    ALLERGIES:  is allergic to lisinopril and other.  MEDICATIONS:  Current Outpatient Prescriptions  Medication Sig Dispense Refill  . amLODipine (NORVASC) 5 MG tablet Take 5 mg by mouth daily.    Marland Kitchen aspirin  EC 81 MG tablet Take 81 mg by mouth daily.    Marland Kitchen atorvastatin (LIPITOR) 40 MG tablet Take 40 mg by mouth daily.    . clopidogrel (PLAVIX) 75 MG tablet Take 75 mg by mouth daily.    Marland Kitchen docusate sodium (COLACE) 100 MG capsule Take 1 capsule (100 mg total) by mouth 2 (two) times daily. 60 capsule 0  . hydrochlorothiazide (HYDRODIURIL) 12.5 MG tablet Take 12.5 mg by mouth daily.     Marland Kitchen losartan (COZAAR) 50 MG tablet Take 50 mg by mouth every morning.     . metoprolol succinate (TOPROL-XL) 50 MG 24 hr tablet Take 50 mg by mouth every morning.     . nitroGLYCERIN (NITROSTAT)  0.4 MG SL tablet Place under the tongue.    . pazopanib (VOTRIENT) 200 MG tablet Take 4 tablets (800 mg total) by mouth daily. Take on an empty stomach. 120 tablet 3  . tamsulosin (FLOMAX) 0.4 MG CAPS capsule Take 0.4 mg by mouth every morning.      No current facility-administered medications for this visit.    PHYSICAL EXAMINATION: ECOG PERFORMANCE STATUS: 0 - Asymptomatic  BP 123/83 mmHg  Pulse 62  Temp(Src) 98.1 F (36.7 C) (Tympanic)  Resp 18  Wt 205 lb 14.6 oz (93.4 kg)  Filed Weights   06/01/16 1457  Weight: 205 lb 14.6 oz (93.4 kg)    GENERAL: Well-nourished well-developed; Alert, no distress and comfortable. Accompanied by wife and daughter. EYES: no pallor or icterus OROPHARYNX: no thrush or ulceration; NECK: supple, no masses felt LYMPH:  no palpable lymphadenopathy in the cervical, axillary or inguinal regions LUNGS: clear to auscultation and  No wheeze or crackles HEART/CVS: regular rate & rhythm and no murmurs; No lower extremity edema ABDOMEN:abdomen soft, non-tender and normal bowel sounds Musculoskeletal:no cyanosis of digits and no clubbing  PSYCH: alert & oriented x 3 with fluent speech NEURO: no focal motor/sensory deficits SKIN:  no rashes or significant lesions; nephrectomy incisions healing well.  LABORATORY DATA:  I have reviewed the data as listed    Component Value Date/Time   NA 135 17-Mar-202017 1420   NA 146* 05/25/2016 1514   K 3.4* 17-Mar-202017 1420   CL 102 17-Mar-202017 1420   CO2 27 17-Mar-202017 1420   GLUCOSE 109* 17-Mar-202017 1420   GLUCOSE 85 05/25/2016 1514   BUN 22* 17-Mar-202017 1420   BUN 19 05/25/2016 1514   CREATININE 1.50* 17-Mar-202017 1420   CALCIUM 9.6 17-Mar-202017 1420   PROT 7.5 17-Mar-202017 1420   ALBUMIN 4.5 17-Mar-202017 1420   AST 23 17-Mar-202017 1420   ALT 20 17-Mar-202017 1420   ALKPHOS 85 17-Mar-202017 1420   BILITOT 0.8 17-Mar-202017 1420   GFRNONAA 44* 17-Mar-202017 1420   GFRAA 51* 17-Mar-202017 1420    No results found for: SPEP,  UPEP  Lab Results  Component Value Date   WBC 7.8 17-Mar-202017   NEUTROABS 5.4 17-Mar-202017   HGB 14.7 17-Mar-202017   HCT 40.6 17-Mar-202017   MCV 90.9 17-Mar-202017   PLT 176 17-Mar-202017      Chemistry      Component Value Date/Time   NA 135 17-Mar-202017 1420   NA 146* 05/25/2016 1514   K 3.4* 17-Mar-202017 1420   CL 102 17-Mar-202017 1420   CO2 27 17-Mar-202017 1420   BUN 22* 17-Mar-202017 1420   BUN 19 05/25/2016 1514   CREATININE 1.50* 17-Mar-202017 1420      Component Value Date/Time   CALCIUM 9.6 17-Mar-202017 1420   ALKPHOS 85 17-Mar-202017 1420  AST 23 May 24, 202017 1420   ALT 20 May 24, 202017 1420   BILITOT 0.8 May 24, 202017 1420      RADIOGRAPHIC STUDIES: I have personally reviewed the radiological images as listed and agreed with the findings in the report. No results found.  IMPRESSION: 1. Progressive metastatic disease to the lungs, with enlargement of most previously noted pulmonary nodules. No new nodules are identified. 2. Status post wedge resection in the anterior aspect of the left upper lobe. 3. Aortic atherosclerosis, in addition to 2 vessel coronary artery disease. Assessment for potential risk factor modification, dietary therapy or pharmacologic therapy may be warranted, if clinically indicated. 4. Saber sheath trachea, mild diffuse bronchial wall thickening and moderate centrilobular and mild paraseptal emphysema; imaging findings suggestive of COPD. 5. Additional incidental findings, as above.   Electronically Signed  By: Vinnie Langton M.D.  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 Stage IV; CT lung- increasing in the size of the lung nodules.   # Recommend starting Votrient 200 mg 4 pills a day. Patient signed paperwork/co-pay assistance today.   # Again reviewed the side effects in detail/ patient agrees to start when  available.  # Recommend checking LFTs in 3 weeks; follow up with me in 5 weeks with labs CBC CMP.   I reviewed the images myself and with the patient and family in detail. A copy of this report was given.    Orders Placed This Encounter  Procedures  . Hepatic function panel    Standing Status: Future     Number of Occurrences:      Standing Expiration Date: 2020/08/917   All questions were answered. The patient knows to call the clinic with any problems, questions or concerns.  # 25 minutes face-to-face with the patient discussing the above plan of care; more than 50% of time spent on prognosis/ natural history; counseling and coordination.      Cammie Sickle, MD 2020/08/916 4:17 PM

## 2016-06-01 NOTE — Progress Notes (Signed)
Patient here today for CT results.  Patient states he is anxious and wants to know if he can have something to calm him.

## 2016-06-01 NOTE — Progress Notes (Signed)
votrient rx-patient assistance foundation form completed. Pending pt's verification of income. Wife will bring proof of income no later than Monday next week.  Per wife, pt will be out of town on Wednesday x 1 week.  He will ensure someone is available to sign for shipment of drug should votrient come available next week.

## 2016-06-05 DIAGNOSIS — F418 Other specified anxiety disorders: Secondary | ICD-10-CM | POA: Diagnosis not present

## 2016-06-08 ENCOUNTER — Telehealth: Payer: Self-pay | Admitting: *Deleted

## 2016-06-08 NOTE — Telephone Encounter (Signed)
Glen Davis, please contact the Patient assistance Foundation [phone] to find out if patient has been approved for financial assistance for his Votriet.  I sent the fax application on Monday 009 and have not heard anything else.

## 2016-06-13 ENCOUNTER — Encounter: Payer: Self-pay | Admitting: *Deleted

## 2016-06-13 NOTE — Telephone Encounter (Signed)
Per Pt assistance foundation- application missing Dr. Aletha Halim signature at the bottom of page 2 (where md signed that he read and agreed to the policies of pt assistance foundation.  Md signed form and the application.

## 2016-06-13 NOTE — Telephone Encounter (Signed)
Rn contacted [employer] Patient assistance Foundation [phone] to update on application status for votrient financial assistance.

## 2016-06-18 NOTE — Telephone Encounter (Signed)
Pt called answering service requesting call back from [place], RN  Pt wanted update on Votrient application.  I informed pt that Dr. Rogue Bussing received a letter via fax today.  Fax from [employer] stated, "pt most likely approved for drug assistance."   "we have determined that the patient may be eligible for assistance with obtaining medication from an alternative funding source.  Please have your patient contact the fund below for further assistance:  [place] 1 [phone]. While you are waiting for an answer from the alternative funding source, your patient is eligible to receive up to a three month supply of the medication form NPAF. If the patient applies for and is denied eligibility to one of the funding sources above, please advise your patient to send [place] a copy of the denial letter from that alternative source for them to continue receiving assistance through the NPAF. The patient may mail or fax the denial letter to [place]:  Fax: 1 [phone] or mail: [employer] [facility] [place]. [place], [place] [address]."  Pt was updated on this information.

## 2016-06-19 ENCOUNTER — Telehealth: Payer: Self-pay | Admitting: Internal Medicine

## 2016-06-19 NOTE — Telephone Encounter (Signed)
They need Glen Davis to please call patient assistance in [place] and provide code so that Glen Davis can qualify for assistance in getting his Pazopanib pills. Please call: [phone]. After doing this, please contact pt again asap  to let them know it has been done so they can call and get the ball rolling. Thank you!

## 2016-06-19 NOTE — Telephone Encounter (Signed)
RN Contacted the [employer] at [phone].  Application online--[email or web address] must be completed asap online.  This will only provided pt with 60 days worth drug.  Pt will be mailed an written application within the next few weeks. Pt must return this application asap. Must be returned well within 60 days to obtain long term copay assistance. copay card must be sent back to speciality pharmacy to process drug shipment.     Contacted patient. Completed Application via phone.  Obtained Enrollment Request Confirmation#[id number]  Program Card copied into chart below. Pt will come pick up a copy of this card next week for his personal records.  I provided the patient with the phone numbers of the patient assistance fund. Should he not have the written application mailed to him within the next 2 weeks, I've instructed the patient to call this fund to determine the status on the mail application forms.

## 2016-06-19 NOTE — Telephone Encounter (Signed)
For records on patient advocacy, below is my written correspondence with the patient assistance fund.   Enrollment Request Confirmation#[id number]  Patient Advocate [place] Thank you for chatting with [place]. My name is Destine, and I am the Patient Advocate that will assist you. How may I help? 2:27:43 PM Me hi this is Renita Papa, RN from the [facility]. I am trying to help my patient. I just helped him submit the online application for votrient pt assistance 2:28:35 PM this is the msg that I rcvd 2:28:43 PM Thank you for your interest in The Assistance Fund's Renal Cell Copay Program. Based on the information you provided, this enrollment application has been conditionally approved. In order to coordinate payments for medication(s), health insurance premiums, basic incidental medical expenses and/or travel related services (per qualifying program), please contact a Patient Advocate. 2:28:47 PM who is the patient advocate. whom do I need to talk to next? 2:28:58 PM Patient Advocate [place] This means the patient is conditionally approved. Conditionally approved means the patient has the assistance for 60 days (Until the expiration date on the program card). 2:31:30 PM During this 60 day period we will be contacting the patient and sending an application the patient will have to complete to obtain full approval. (Full approval is until 12.31.2017) 2:32:21 PM Me ok. that is only 2 months. does this mean that the pt will need to reapply? 2:32:33 PM Patient Advocate [place] During this 60 day period we will be contacting the patient and sending an application the patient will have to complete to obtain full approval. (Full approval is until 12.31.2017) 2:33:00 PM Me ok. does the patient need to do anything else or do I need to do anything else? ie. fax the prescription? this is my first time using your services. 2:33:29 PM Patient Advocate [place] The next step will be  giving the information on the program card to the pharmacy dispensing the drug. 2:34:25 PM For the patient, the next step will be submitting the app by the deadline. 2:34:52 PM Me is this [employer] or [employer]? I reached out to [employer] initially and copay too high was transferred to [employer] 2:35:13 PM Patient Advocate [place] is a non Facilities manager. We are totally separate from those companies. However, they do refer patients to our program for assistance. 2:36:25 PM Me so this would need to go back to [employer] and faxed back there to dispense drug free is my understanding correct? 2:36:58 PM Patient Advocate Destine Im sorry, there must be a miscommunication. What would need to be sent back to [employer]? 2:38:13 PM Please give [place] a call at [phone]. We may be able to better assist while speaking directly. 2:39:14 PM Me this drug is a specialty drug. pt is not able to get at local pharmacy. must be shipped from either the drug company or specialty pharmacy. Who do I need to send this program card number to so that pt gets drug 2:41:13 PM Patient Advocate [place] The specialty pharmacy will need the program card information. This is how they will bill [place] directly for the patient's copay responsibility. 2:42:51 PM Me thank you that is all I need. Thanks for your help

## 2016-06-19 NOTE — Telephone Encounter (Signed)
Contacted [employer] - Fax to attention:  [employer] will process this copay card.

## 2016-06-22 ENCOUNTER — Inpatient Hospital Stay: Payer: PPO | Attending: Internal Medicine

## 2016-06-22 DIAGNOSIS — Z87891 Personal history of nicotine dependence: Secondary | ICD-10-CM | POA: Insufficient documentation

## 2016-06-22 DIAGNOSIS — C7951 Secondary malignant neoplasm of bone: Secondary | ICD-10-CM | POA: Insufficient documentation

## 2016-06-22 DIAGNOSIS — C642 Malignant neoplasm of left kidney, except renal pelvis: Secondary | ICD-10-CM

## 2016-06-22 DIAGNOSIS — C7800 Secondary malignant neoplasm of unspecified lung: Secondary | ICD-10-CM | POA: Diagnosis not present

## 2016-06-22 DIAGNOSIS — Z87442 Personal history of urinary calculi: Secondary | ICD-10-CM | POA: Insufficient documentation

## 2016-06-22 DIAGNOSIS — Z7982 Long term (current) use of aspirin: Secondary | ICD-10-CM | POA: Diagnosis not present

## 2016-06-22 DIAGNOSIS — I700 Atherosclerosis of aorta: Secondary | ICD-10-CM | POA: Diagnosis not present

## 2016-06-22 DIAGNOSIS — I10 Essential (primary) hypertension: Secondary | ICD-10-CM | POA: Insufficient documentation

## 2016-06-22 DIAGNOSIS — R109 Unspecified abdominal pain: Secondary | ICD-10-CM | POA: Insufficient documentation

## 2016-06-22 DIAGNOSIS — I2510 Atherosclerotic heart disease of native coronary artery without angina pectoris: Secondary | ICD-10-CM | POA: Diagnosis not present

## 2016-06-22 DIAGNOSIS — Z807 Family history of other malignant neoplasms of lymphoid, hematopoietic and related tissues: Secondary | ICD-10-CM | POA: Diagnosis not present

## 2016-06-22 DIAGNOSIS — J439 Emphysema, unspecified: Secondary | ICD-10-CM | POA: Insufficient documentation

## 2016-06-22 LAB — HEPATIC FUNCTION PANEL
ALBUMIN: 4.1 g/dL (ref 3.5–5.0)
ALT: 24 U/L (ref 17–63)
AST: 27 U/L (ref 15–41)
Alkaline Phosphatase: 91 U/L (ref 38–126)
BILIRUBIN TOTAL: 0.8 mg/dL (ref 0.3–1.2)
Bilirubin, Direct: <0.1 mg/dL — ABNORMAL LOW (ref 0.1–0.5)
TOTAL PROTEIN: 6.9 g/dL (ref 6.5–8.1)

## 2016-06-26 ENCOUNTER — Telehealth: Payer: Self-pay | Admitting: *Deleted

## 2016-06-26 NOTE — Telephone Encounter (Signed)
Chemo pills came in and they were told to contact us to let them know when to start taking them. Please advise

## 2016-06-26 NOTE — Telephone Encounter (Signed)
OK to start med today per Vickki Muff, RN, and keep appt as scheduled for 07/06/16. Returned call to wife, but no answer, so I called Mr Schroepfer and advised him to start taking med today and keep appt for 8/18. He repeated this back to me and thanked me for calling

## 2016-06-28 ENCOUNTER — Other Ambulatory Visit: Payer: Self-pay | Admitting: Urology

## 2016-07-06 ENCOUNTER — Other Ambulatory Visit: Payer: Self-pay

## 2016-07-06 ENCOUNTER — Other Ambulatory Visit: Payer: PPO

## 2016-07-06 ENCOUNTER — Ambulatory Visit: Payer: PPO | Admitting: Internal Medicine

## 2016-07-06 ENCOUNTER — Telehealth: Payer: Self-pay | Admitting: Internal Medicine

## 2016-07-06 ENCOUNTER — Inpatient Hospital Stay: Payer: PPO

## 2016-07-06 ENCOUNTER — Inpatient Hospital Stay (HOSPITAL_BASED_OUTPATIENT_CLINIC_OR_DEPARTMENT_OTHER): Payer: PPO | Admitting: Internal Medicine

## 2016-07-06 VITALS — BP 133/84 | HR 54 | Temp 97.1°F | Resp 18 | Wt 205.1 lb

## 2016-07-06 DIAGNOSIS — Z87442 Personal history of urinary calculi: Secondary | ICD-10-CM

## 2016-07-06 DIAGNOSIS — Z7982 Long term (current) use of aspirin: Secondary | ICD-10-CM

## 2016-07-06 DIAGNOSIS — R109 Unspecified abdominal pain: Secondary | ICD-10-CM | POA: Diagnosis not present

## 2016-07-06 DIAGNOSIS — C642 Malignant neoplasm of left kidney, except renal pelvis: Secondary | ICD-10-CM | POA: Diagnosis not present

## 2016-07-06 DIAGNOSIS — I2510 Atherosclerotic heart disease of native coronary artery without angina pectoris: Secondary | ICD-10-CM

## 2016-07-06 DIAGNOSIS — C7800 Secondary malignant neoplasm of unspecified lung: Secondary | ICD-10-CM

## 2016-07-06 DIAGNOSIS — J439 Emphysema, unspecified: Secondary | ICD-10-CM

## 2016-07-06 DIAGNOSIS — I700 Atherosclerosis of aorta: Secondary | ICD-10-CM

## 2016-07-06 DIAGNOSIS — Z87891 Personal history of nicotine dependence: Secondary | ICD-10-CM

## 2016-07-06 DIAGNOSIS — I10 Essential (primary) hypertension: Secondary | ICD-10-CM

## 2016-07-06 DIAGNOSIS — C7951 Secondary malignant neoplasm of bone: Secondary | ICD-10-CM | POA: Diagnosis not present

## 2016-07-06 DIAGNOSIS — Z807 Family history of other malignant neoplasms of lymphoid, hematopoietic and related tissues: Secondary | ICD-10-CM

## 2016-07-06 LAB — COMPREHENSIVE METABOLIC PANEL
ALBUMIN: 4.2 g/dL (ref 3.5–5.0)
ALK PHOS: 103 U/L (ref 38–126)
ALT: 34 U/L (ref 17–63)
AST: 34 U/L (ref 15–41)
Anion gap: 6 (ref 5–15)
BILIRUBIN TOTAL: 1 mg/dL (ref 0.3–1.2)
BUN: 20 mg/dL (ref 6–20)
CO2: 27 mmol/L (ref 22–32)
CREATININE: 1.46 mg/dL — AB (ref 0.61–1.24)
Calcium: 8.8 mg/dL — ABNORMAL LOW (ref 8.9–10.3)
Chloride: 100 mmol/L — ABNORMAL LOW (ref 101–111)
GFR calc Af Amer: 52 mL/min — ABNORMAL LOW (ref >60)
GFR, EST NON AFRICAN AMERICAN: 45 mL/min — AB (ref >60)
GLUCOSE: 101 mg/dL — AB (ref 65–99)
POTASSIUM: 3.3 mmol/L — AB (ref 3.5–5.1)
Sodium: 133 mmol/L — ABNORMAL LOW (ref 135–145)
TOTAL PROTEIN: 7 g/dL (ref 6.5–8.1)

## 2016-07-06 LAB — CBC WITH DIFFERENTIAL/PLATELET
BASOS ABS: 0 10*3/uL (ref 0–0.1)
Basophils Relative: 1 %
EOS ABS: 0.1 10*3/uL (ref 0–0.7)
HCT: 40.1 % (ref 40.0–52.0)
Hemoglobin: 14.8 g/dL (ref 13.0–18.0)
Lymphs Abs: 1.4 10*3/uL (ref 1.0–3.6)
MCH: 32.3 pg (ref 26.0–34.0)
MCHC: 36.9 g/dL — ABNORMAL HIGH (ref 32.0–36.0)
MCV: 87.3 fL (ref 80.0–100.0)
MONO ABS: 0.5 10*3/uL (ref 0.2–1.0)
Monocytes Relative: 9 %
Neutro Abs: 4.2 10*3/uL (ref 1.4–6.5)
Neutrophils Relative %: 66 %
PLATELETS: 124 10*3/uL — AB (ref 150–440)
RBC: 4.59 MIL/uL (ref 4.40–5.90)
RDW: 13.5 % (ref 11.5–14.5)
WBC: 6.2 10*3/uL (ref 3.8–10.6)

## 2016-07-06 LAB — LIPASE, BLOOD: LIPASE: 52 U/L — AB (ref 11–51)

## 2016-07-06 LAB — LACTATE DEHYDROGENASE: LDH: 190 U/L (ref 98–192)

## 2016-07-06 LAB — AMYLASE: Amylase: 106 U/L — ABNORMAL HIGH (ref 28–100)

## 2016-07-06 NOTE — Progress Notes (Signed)
[facility] OFFICE PROGRESS NOTE  Patient Care Team: Dion Body, MD as PCP - General (Family Medicine) Jannet Mantis, MD (Dermatology) Robert Bellow, MD (General Surgery) Hollice Espy, MD as Consulting Physician (Urology)  No matching staging information was found for the patient.   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  male pleasant patient above history of Newly diagnosed renal cell cancer status post left nephrectomy- With metastases to the lung; started on Votrient August 7 is here for follow-up  Patient admits to intermittent abdominal pain that lasts about half an hour post-Votrient. Resolves by itself. No diarrhea. No cramping. No lymphocytic symptoms.  He is fairly active. Denies any unusual shortness of breath or cough. Denies any nausea vomiting. Denies any chest pain.    REVIEW OF SYSTEMS:  A complete 10 point review of system is done which is negative except mentioned above/history of present illness.   PAST MEDICAL HISTORY :  Past Medical History:  Diagnosis Date  . CAD (coronary artery disease)   . Cancer ([place])    left arm  . Hypertension   . Kidney stone     PAST SURGICAL HISTORY :   Past Surgical History:  Procedure Laterality Date  . arm  surgery Left    arm  . cardiac stents  2011   Angioplasty / Stenting Femoral-X2  . COLONOSCOPY  04/01/12  . CORONARY ANGIOPLASTY    . EXCISION MELANOMA WITH SENTINEL LYMPH NODE BIOPSY Right 01/24/2016   Procedure: EXCISION MELANOMA Right Shoulder;  Surgeon: Robert Bellow, MD;  Location: [facility] ORS;  Service: General;  Laterality: Right;  . LAPAROSCOPIC NEPHRECTOMY, HAND ASSISTED Left 04/24/2016   Procedure: HAND ASSISTED LAPAROSCOPIC NEPHRECTOMY;  Surgeon: Hollice Espy, MD;  Location: [facility] ORS;  Service: Urology;  Laterality: Left;  Marland Kitchen VIDEO ASSISTED THORACOSCOPY (VATS)/THOROCOTOMY Left 03/08/2016   Procedure: VIDEO ASSISTED THORACOSCOPY (VATS) with lung biopsy - Left ;  Surgeon: Robert Bellow, MD;  Location: [facility] ORS;  Service: General;  Laterality: Left;    FAMILY HISTORY :   Family History  Problem Relation Age of Onset  . Hodgkin's lymphoma Daughter 5  . Kidney cancer Neg Hx   . Kidney disease Neg Hx   . Prostate cancer Neg Hx     SOCIAL HISTORY:   Social History  Substance Use Topics  . Smoking status: Former Smoker    Types: Cigarettes    Start date: 01/18/1956    Quit date: 01/17/1969  . Smokeless tobacco: Never Used  . Alcohol use 0.0 oz/week     Comment: BEER OCC    ALLERGIES:  is allergic to lisinopril and other.  MEDICATIONS:  Current Outpatient Prescriptions  Medication Sig Dispense Refill  . amLODipine (NORVASC) 5 MG tablet Take 5  mg by mouth daily.    Marland Kitchen aspirin EC 81 MG tablet Take 81 mg by mouth daily.    Marland Kitchen atorvastatin (LIPITOR) 40 MG tablet Take 40 mg by mouth daily.    . clopidogrel (PLAVIX) 75 MG tablet Take 75 mg by mouth daily.    Marland Kitchen docusate sodium (COLACE) 100 MG capsule TAKE ONE CAPSULE BY MOUTH TWICE DAILY 60 capsule 0  . hydrochlorothiazide (HYDRODIURIL) 12.5 MG tablet Take 12.5 mg by mouth daily.     Marland Kitchen losartan (COZAAR) 50 MG tablet Take 50 mg by mouth every morning.     . metoprolol succinate (TOPROL-XL) 50 MG 24 hr tablet Take 50 mg by mouth  every morning.     . nitroGLYCERIN (NITROSTAT) 0.4 MG SL tablet Place under the tongue.    . pazopanib (VOTRIENT) 200 MG tablet Take 4 tablets (800 mg total) by mouth daily. Take on an empty stomach. 120 tablet 3  . tamsulosin (FLOMAX) 0.4 MG CAPS capsule Take 0.4 mg by mouth every morning.      No current facility-administered medications for this visit.     PHYSICAL EXAMINATION: ECOG PERFORMANCE STATUS: 0 - Asymptomatic  BP 133/84 (BP Location: Left Arm, Patient Position: Sitting)   Pulse (!) 54   Temp 97.1 F (36.2 C) (Tympanic)   Resp 18   Wt 205 lb 2 oz (93 kg)   BMI 27.06 kg/m   Filed Weights   07/06/16 1513  Weight: 205 lb 2 oz (93 kg)    GENERAL: Well-nourished well-developed; Alert, no distress and comfortable. Accompanied by wife and daughter. EYES: no pallor or icterus OROPHARYNX: no thrush or ulceration; NECK: supple, no masses felt LYMPH:  no palpable lymphadenopathy in the cervical, axillary or inguinal regions LUNGS: clear to auscultation and  No wheeze or crackles HEART/CVS: regular rate & rhythm and no murmurs; No lower extremity edema ABDOMEN:abdomen soft, non-tender and normal bowel sounds Musculoskeletal:no cyanosis of digits and no clubbing  PSYCH: alert & oriented x 3 with fluent speech NEURO: no focal motor/sensory deficits SKIN:  no rashes or significant lesions; nephrectomy incisions healing well.  LABORATORY DATA:  I have reviewed the data as listed    Component Value Date/Time   NA 133 (L) 07/06/2016 1414   NA 146 (H) 05/25/2016 1514   K 3.3 (L) 07/06/2016 1414   CL 100 (L) 07/06/2016 1414   CO2 27 07/06/2016 1414   GLUCOSE 101 (H) 07/06/2016 1414   BUN 20 07/06/2016 1414   BUN 19 05/25/2016 1514   CREATININE 1.46 (H) 07/06/2016 1414   CALCIUM 8.8 (L) 07/06/2016 1414   PROT 7.0 07/06/2016 1414   ALBUMIN 4.2 07/06/2016 1414   AST 34 07/06/2016 1414   ALT 34 07/06/2016 1414   ALKPHOS 103 07/06/2016 1414   BILITOT 1.0 07/06/2016 1414    GFRNONAA 45 (L) 07/06/2016 1414   GFRAA 52 (L) 07/06/2016 1414    No results found for: SPEP, UPEP  Lab Results  Component Value Date   WBC 6.2 07/06/2016   NEUTROABS 4.2 07/06/2016   HGB 14.8 07/06/2016   HCT 40.1 07/06/2016   MCV 87.3 07/06/2016   PLT 124 (L) 07/06/2016      Chemistry      Component Value Date/Time   NA 133 (L) 07/06/2016 1414   NA 146 (H) 05/25/2016 1514   K 3.3 (L) 07/06/2016 1414   CL 100 (L) 07/06/2016 1414   CO2 27 07/06/2016 1414   BUN 20 07/06/2016 1414  BUN 19 05/25/2016 1514   CREATININE 1.46 (H) 07/06/2016 1414      Component Value Date/Time   CALCIUM 8.8 (L) 07/06/2016 1414   ALKPHOS 103 07/06/2016 1414   AST 34 07/06/2016 1414   ALT 34 07/06/2016 1414   BILITOT 1.0 07/06/2016 1414      RADIOGRAPHIC STUDIES: I have personally reviewed the radiological images as listed and agreed with the findings in the report. No results found.  IMPRESSION: 1. Progressive metastatic disease to the lungs, with enlargement of most previously noted pulmonary nodules. No new nodules are identified. 2. Status post wedge resection in the anterior aspect of the left upper lobe. 3. Aortic atherosclerosis, in addition to 2 vessel coronary artery disease. Assessment for potential risk factor modification, dietary therapy or pharmacologic therapy may be warranted, if clinically indicated. 4. Saber sheath trachea, mild diffuse bronchial wall thickening and moderate centrilobular and mild paraseptal emphysema; imaging findings suggestive of COPD. 5. Additional incidental findings, as above.   Electronically Signed  By: Vinnie Langton M.D.  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 Stage IV; CT lung- increasing in the size of the lung nodules. Recommend starting Votrient 200 mg 4 pills a day  # Abdominal  pain- ? Etiology. Monitor for now.add lipase/amylase.   # repeat labs in 2 w;   # follow up with in 4 weeks/labs.    Orders Placed This Encounter  Procedures  . Amylase    Standing Status:   Future    Number of Occurrences:   1    Standing Expiration Date:   06/29/2017  . Lipase, blood    Standing Status:   Future    Number of Occurrences:   1    Standing Expiration Date:   06/29/2017   All questions were answered. The patient knows to call the clinic with any problems, questions or concerns.  # 25 minutes face-to-face with the patient discussing the above plan of care; more than 50% of time spent on prognosis/ natural history; counseling and coordination.      Cammie Sickle, MD 07/06/2016 4:56 PM

## 2016-07-06 NOTE — Progress Notes (Signed)
Patient states his stomach hurts after taking his oral chemotherapy.  Further c/o fatigue.

## 2016-07-06 NOTE — Assessment & Plan Note (Signed)
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 Stage IV; CT lung- increasing in the size of the lung nodules. Recommend starting Votrient 200 mg 4 pills a day  # Abdominal pain- ? Etiology. Monitor for now.add lipase/amylase.   # repeat labs in 2 w;   # follow up with in 4 weeks/labs.

## 2016-07-07 NOTE — Telephone Encounter (Signed)
x

## 2016-07-20 ENCOUNTER — Other Ambulatory Visit: Payer: Self-pay | Admitting: *Deleted

## 2016-07-20 ENCOUNTER — Inpatient Hospital Stay: Payer: PPO | Attending: Internal Medicine | Admitting: *Deleted

## 2016-07-20 ENCOUNTER — Telehealth: Payer: Self-pay | Admitting: Internal Medicine

## 2016-07-20 DIAGNOSIS — Z87442 Personal history of urinary calculi: Secondary | ICD-10-CM | POA: Insufficient documentation

## 2016-07-20 DIAGNOSIS — Z807 Family history of other malignant neoplasms of lymphoid, hematopoietic and related tissues: Secondary | ICD-10-CM | POA: Insufficient documentation

## 2016-07-20 DIAGNOSIS — C642 Malignant neoplasm of left kidney, except renal pelvis: Secondary | ICD-10-CM | POA: Diagnosis not present

## 2016-07-20 DIAGNOSIS — I10 Essential (primary) hypertension: Secondary | ICD-10-CM | POA: Diagnosis not present

## 2016-07-20 DIAGNOSIS — R55 Syncope and collapse: Secondary | ICD-10-CM | POA: Insufficient documentation

## 2016-07-20 DIAGNOSIS — C4359 Malignant melanoma of other part of trunk: Secondary | ICD-10-CM | POA: Insufficient documentation

## 2016-07-20 DIAGNOSIS — I2510 Atherosclerotic heart disease of native coronary artery without angina pectoris: Secondary | ICD-10-CM | POA: Insufficient documentation

## 2016-07-20 DIAGNOSIS — Z79899 Other long term (current) drug therapy: Secondary | ICD-10-CM | POA: Insufficient documentation

## 2016-07-20 DIAGNOSIS — Z7982 Long term (current) use of aspirin: Secondary | ICD-10-CM | POA: Insufficient documentation

## 2016-07-20 DIAGNOSIS — Z87891 Personal history of nicotine dependence: Secondary | ICD-10-CM | POA: Diagnosis not present

## 2016-07-20 DIAGNOSIS — C7800 Secondary malignant neoplasm of unspecified lung: Secondary | ICD-10-CM | POA: Diagnosis not present

## 2016-07-20 DIAGNOSIS — J439 Emphysema, unspecified: Secondary | ICD-10-CM | POA: Insufficient documentation

## 2016-07-20 DIAGNOSIS — R197 Diarrhea, unspecified: Secondary | ICD-10-CM | POA: Insufficient documentation

## 2016-07-20 DIAGNOSIS — K1379 Other lesions of oral mucosa: Secondary | ICD-10-CM

## 2016-07-20 DIAGNOSIS — R112 Nausea with vomiting, unspecified: Secondary | ICD-10-CM | POA: Insufficient documentation

## 2016-07-20 DIAGNOSIS — R109 Unspecified abdominal pain: Secondary | ICD-10-CM | POA: Insufficient documentation

## 2016-07-20 LAB — HEPATIC FUNCTION PANEL
ALT: 44 U/L (ref 17–63)
AST: 32 U/L (ref 15–41)
Albumin: 4 g/dL (ref 3.5–5.0)
Alkaline Phosphatase: 102 U/L (ref 38–126)
BILIRUBIN DIRECT: 0.1 mg/dL (ref 0.1–0.5)
BILIRUBIN INDIRECT: 0.8 mg/dL (ref 0.3–0.9)
BILIRUBIN TOTAL: 0.9 mg/dL (ref 0.3–1.2)
Total Protein: 7.4 g/dL (ref 6.5–8.1)

## 2016-07-20 LAB — LIPASE, BLOOD: LIPASE: 108 U/L — AB (ref 11–51)

## 2016-07-20 LAB — AMYLASE: AMYLASE: 129 U/L — AB (ref 28–100)

## 2016-07-20 MED ORDER — MAGIC MOUTHWASH W/LIDOCAINE: 5.0000 mL | Freq: Four times a day (QID) | ORAL | 3 refills | Status: DC

## 2016-07-20 NOTE — Telephone Encounter (Signed)
magic mouthwash rx faxed to [facility]

## 2016-07-20 NOTE — Telephone Encounter (Signed)
Recommend magic mouth wash; cut pazopanib to 2 pills a day. Repeat lipase/amylase in 2 weeks/lfts.

## 2016-07-26 ENCOUNTER — Telehealth: Payer: Self-pay | Admitting: *Deleted

## 2016-07-26 ENCOUNTER — Inpatient Hospital Stay: Payer: PPO

## 2016-07-26 DIAGNOSIS — C642 Malignant neoplasm of left kidney, except renal pelvis: Secondary | ICD-10-CM

## 2016-07-26 DIAGNOSIS — R1084 Generalized abdominal pain: Secondary | ICD-10-CM

## 2016-07-26 LAB — COMPREHENSIVE METABOLIC PANEL
ALBUMIN: 4 g/dL (ref 3.5–5.0)
ALK PHOS: 103 U/L (ref 38–126)
ALT: 38 U/L (ref 17–63)
ANION GAP: 10 (ref 5–15)
AST: 33 U/L (ref 15–41)
BILIRUBIN TOTAL: 0.9 mg/dL (ref 0.3–1.2)
BUN: 15 mg/dL (ref 6–20)
CALCIUM: 9.1 mg/dL (ref 8.9–10.3)
CO2: 27 mmol/L (ref 22–32)
Chloride: 93 mmol/L — ABNORMAL LOW (ref 101–111)
Creatinine, Ser: 1.33 mg/dL — ABNORMAL HIGH (ref 0.61–1.24)
GFR calc Af Amer: 59 mL/min — ABNORMAL LOW (ref >60)
GFR, EST NON AFRICAN AMERICAN: 51 mL/min — AB (ref >60)
GLUCOSE: 223 mg/dL — AB (ref 65–99)
Potassium: 3.1 mmol/L — ABNORMAL LOW (ref 3.5–5.1)
Sodium: 130 mmol/L — ABNORMAL LOW (ref 135–145)
TOTAL PROTEIN: 7.2 g/dL (ref 6.5–8.1)

## 2016-07-26 LAB — LIPASE, BLOOD: Lipase: 43 U/L (ref 11–51)

## 2016-07-26 LAB — CBC WITH DIFFERENTIAL/PLATELET
Basophils Absolute: 0 10*3/uL (ref 0–0.1)
Basophils Relative: 1 %
EOS ABS: 0.1 10*3/uL (ref 0–0.7)
HCT: 41 % (ref 40.0–52.0)
Hemoglobin: 15 g/dL (ref 13.0–18.0)
LYMPHS ABS: 1.1 10*3/uL (ref 1.0–3.6)
MCH: 32.4 pg (ref 26.0–34.0)
MCHC: 36.5 g/dL — AB (ref 32.0–36.0)
MCV: 88.8 fL (ref 80.0–100.0)
MONO ABS: 0.3 10*3/uL (ref 0.2–1.0)
Neutro Abs: 4.2 10*3/uL (ref 1.4–6.5)
Neutrophils Relative %: 73 %
PLATELETS: 151 10*3/uL (ref 150–440)
RBC: 4.61 MIL/uL (ref 4.40–5.90)
RDW: 16.5 % — ABNORMAL HIGH (ref 11.5–14.5)
WBC: 5.7 10*3/uL (ref 3.8–10.6)

## 2016-07-26 LAB — AMYLASE: Amylase: 88 U/L (ref 28–100)

## 2016-07-26 NOTE — Telephone Encounter (Signed)
Pt waited for lab results.  Pt advised to stop votrient.  He took his morning dose this morning. Pt c/o indigestion and severe gas pains in abd. Worse in morning and evening after taking am and pm dosing of votrient.  Pt also mentioned hoarseness of voice after taking the votrient.  Pt advised to take OTC Prilosec or pepto bismal to help with dyspepsia.  Teach back process performed.  Pt inquired if he could have the Influenza vaccine.  I spoke with Dr. Rogue Bussing, when influenza vaccines become available. Pt states that he will inquire about this at a later date at next apt.  Pt also wanted to know if Md would order a ct scan sooner than October.  MD states that pt should keep scheduled appointment for next ct scan- will not order any sooner. Teach back process performed with patient.

## 2016-07-26 NOTE — Telephone Encounter (Signed)
Glen Davis is having bad abd pain and is asking to be seen or to have something called in. Please advise

## 2016-07-26 NOTE — Telephone Encounter (Signed)
Advised Glen Davis per VO Dr Rogue Bussing to stop Votrient and to come in for labs to be checked now, if pain is too bad he should go to ER. She stated she will bring him over now for lab check and he will NOT go to ER. They will wait in office for results.

## 2016-08-01 DIAGNOSIS — F418 Other specified anxiety disorders: Secondary | ICD-10-CM | POA: Diagnosis not present

## 2016-08-01 DIAGNOSIS — R031 Nonspecific low blood-pressure reading: Secondary | ICD-10-CM | POA: Diagnosis not present

## 2016-08-03 ENCOUNTER — Inpatient Hospital Stay: Payer: PPO

## 2016-08-03 ENCOUNTER — Inpatient Hospital Stay (HOSPITAL_BASED_OUTPATIENT_CLINIC_OR_DEPARTMENT_OTHER): Payer: PPO | Admitting: Internal Medicine

## 2016-08-03 ENCOUNTER — Encounter: Payer: Self-pay | Admitting: Internal Medicine

## 2016-08-03 ENCOUNTER — Other Ambulatory Visit: Payer: Self-pay

## 2016-08-03 VITALS — BP 136/83 | HR 66 | Temp 96.4°F | Resp 17 | Ht 73.0 in | Wt 207.6 lb

## 2016-08-03 DIAGNOSIS — R109 Unspecified abdominal pain: Secondary | ICD-10-CM | POA: Diagnosis not present

## 2016-08-03 DIAGNOSIS — C7800 Secondary malignant neoplasm of unspecified lung: Secondary | ICD-10-CM | POA: Diagnosis not present

## 2016-08-03 DIAGNOSIS — Z87891 Personal history of nicotine dependence: Secondary | ICD-10-CM

## 2016-08-03 DIAGNOSIS — J439 Emphysema, unspecified: Secondary | ICD-10-CM

## 2016-08-03 DIAGNOSIS — I10 Essential (primary) hypertension: Secondary | ICD-10-CM

## 2016-08-03 DIAGNOSIS — I2510 Atherosclerotic heart disease of native coronary artery without angina pectoris: Secondary | ICD-10-CM

## 2016-08-03 DIAGNOSIS — C642 Malignant neoplasm of left kidney, except renal pelvis: Secondary | ICD-10-CM

## 2016-08-03 DIAGNOSIS — Z7982 Long term (current) use of aspirin: Secondary | ICD-10-CM

## 2016-08-03 DIAGNOSIS — Z79899 Other long term (current) drug therapy: Secondary | ICD-10-CM

## 2016-08-03 DIAGNOSIS — Z801 Family history of malignant neoplasm of trachea, bronchus and lung: Secondary | ICD-10-CM

## 2016-08-03 DIAGNOSIS — Z87442 Personal history of urinary calculi: Secondary | ICD-10-CM

## 2016-08-03 DIAGNOSIS — C4359 Malignant melanoma of other part of trunk: Secondary | ICD-10-CM

## 2016-08-03 LAB — COMPREHENSIVE METABOLIC PANEL
ALBUMIN: 3.8 g/dL (ref 3.5–5.0)
ALT: 29 U/L (ref 17–63)
ANION GAP: 7 (ref 5–15)
AST: 26 U/L (ref 15–41)
Alkaline Phosphatase: 84 U/L (ref 38–126)
BUN: 15 mg/dL (ref 6–20)
CALCIUM: 8.6 mg/dL — AB (ref 8.9–10.3)
CO2: 26 mmol/L (ref 22–32)
Chloride: 102 mmol/L (ref 101–111)
Creatinine, Ser: 1.29 mg/dL — ABNORMAL HIGH (ref 0.61–1.24)
GFR calc Af Amer: >60 mL/min (ref >60)
GFR calc non Af Amer: 53 mL/min — ABNORMAL LOW (ref >60)
GLUCOSE: 133 mg/dL — AB (ref 65–99)
POTASSIUM: 3 mmol/L — AB (ref 3.5–5.1)
Sodium: 135 mmol/L (ref 135–145)
Total Bilirubin: 0.6 mg/dL (ref 0.3–1.2)
Total Protein: 6.7 g/dL (ref 6.5–8.1)

## 2016-08-03 LAB — CBC WITH DIFFERENTIAL/PLATELET
BASOS ABS: 0 10*3/uL (ref 0–0.1)
EOS ABS: 0.1 10*3/uL (ref 0–0.7)
HCT: 38 % — ABNORMAL LOW (ref 40.0–52.0)
Hemoglobin: 13.8 g/dL (ref 13.0–18.0)
Lymphocytes Relative: 22 %
Lymphs Abs: 1.2 10*3/uL (ref 1.0–3.6)
MCH: 32.9 pg (ref 26.0–34.0)
MCHC: 36.3 g/dL — AB (ref 32.0–36.0)
MCV: 90.5 fL (ref 80.0–100.0)
MONO ABS: 0.6 10*3/uL (ref 0.2–1.0)
Neutro Abs: 3.4 10*3/uL (ref 1.4–6.5)
Neutrophils Relative %: 64 %
PLATELETS: 151 10*3/uL (ref 150–440)
RBC: 4.2 MIL/uL — ABNORMAL LOW (ref 4.40–5.90)
RDW: 16.8 % — AB (ref 11.5–14.5)
WBC: 5.4 10*3/uL (ref 3.8–10.6)

## 2016-08-03 LAB — LIPASE, BLOOD: Lipase: 49 U/L (ref 11–51)

## 2016-08-03 LAB — AMYLASE: Amylase: 82 U/L (ref 28–100)

## 2016-08-03 NOTE — Assessment & Plan Note (Addendum)
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 Stage IV; CT lung- increasing in the size of the lung nodules.   # Recommend re-starting Votrient 200 mg 2 pills a day; if recurrent abdominal pain is an issue/pancreatitis- then recommend discontinuation of Votrient; other options would be opdivo vs. Sutent.   # Abdominal pain- ? Pancreatitis from pazopanib. Currently resolved. Lipase amylase normal.again instructed the patient at length to stop taking his Votrient if noted to have abdominal pain. To call us ASAP.   # repeat labs in 2 w; lipase amylase/LFTs.   # follow up with in MD/  4 weeks/labs.

## 2016-08-03 NOTE — Progress Notes (Signed)
No changes since last visit. 

## 2016-08-03 NOTE — Progress Notes (Signed)
[facility] OFFICE PROGRESS NOTE  Patient Care Team: Dion Body, MD as PCP - General (Family Medicine) Jannet Mantis, MD (Dermatology) Robert Bellow, MD (General Surgery) Hollice Espy, MD as Consulting Physician (Urology)  No matching staging information was found for the patient.   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  male pleasant patient above history of Newly diagnosed renal cell cancer status post left nephrectomy- With metastases to the lung; started on Votrient August 7 is here for follow-up.  votrient was held because of abdominal pain few weeks ago- amylase lipase elevated; resolved. He has not started back on Votrient yet  He is fairly active. Denies any unusual shortness of breath or cough. Denies any nausea vomiting. Denies any chest pain. No diarrhea gaining weight.   REVIEW OF SYSTEMS:  A complete 10 point review of system is done which is negative except mentioned above/history of present illness.   PAST MEDICAL HISTORY :  Past Medical History:  Diagnosis Date  . CAD (coronary artery disease)   . Cancer ([place])    left arm  . Hypertension   . Kidney stone     PAST SURGICAL HISTORY :   Past Surgical History:  Procedure Laterality Date  . arm  surgery Left    arm  . cardiac stents  2011   Angioplasty / Stenting Femoral-X2  . COLONOSCOPY  04/01/12  . CORONARY ANGIOPLASTY    . EXCISION MELANOMA WITH SENTINEL LYMPH NODE BIOPSY Right 01/24/2016   Procedure: EXCISION MELANOMA Right Shoulder;  Surgeon: Robert Bellow, MD;  Location: [facility] ORS;  Service: General;  Laterality: Right;  . LAPAROSCOPIC NEPHRECTOMY, HAND ASSISTED Left 04/24/2016   Procedure: HAND ASSISTED LAPAROSCOPIC NEPHRECTOMY;  Surgeon: Hollice Espy, MD;  Location: [facility] ORS;  Service: Urology;  Laterality: Left;  Marland Kitchen VIDEO ASSISTED THORACOSCOPY (VATS)/THOROCOTOMY Left 03/08/2016   Procedure: VIDEO ASSISTED THORACOSCOPY (VATS) with lung biopsy - Left ;  Surgeon: Robert Bellow, MD;  Location: [facility] ORS;  Service: General;  Laterality: Left;    FAMILY HISTORY :   Family History  Problem Relation Age of Onset  . Hodgkin's lymphoma Daughter 5  . Kidney cancer Neg Hx   . Kidney disease Neg Hx   . Prostate cancer Neg Hx     SOCIAL HISTORY:   Social History  Substance Use Topics  . Smoking status: Former Smoker    Types: Cigarettes    Start date: 01/18/1956    Quit date: 01/17/1969  . Smokeless tobacco: Never Used  . Alcohol use 0.0 oz/week     Comment: BEER OCC    ALLERGIES:  is allergic to lisinopril and other.  MEDICATIONS:  Current Outpatient Prescriptions  Medication Sig Dispense Refill  . aspirin EC 81 MG tablet  Take 81 mg by mouth daily.    Marland Kitchen atorvastatin (LIPITOR) 40 MG tablet Take 40 mg by mouth daily.    . clopidogrel (PLAVIX) 75 MG tablet Take 75 mg by mouth daily.    Marland Kitchen docusate sodium (COLACE) 100 MG capsule TAKE ONE CAPSULE BY MOUTH TWICE DAILY 60 capsule 0  . hydrochlorothiazide (HYDRODIURIL) 12.5 MG tablet Take 12.5 mg by mouth daily.     Marland Kitchen losartan (COZAAR) 50 MG tablet Take 50 mg by mouth every morning.     . metoprolol succinate (TOPROL-XL) 50 MG 24 hr tablet Take 50 mg by mouth every morning.     . nitroGLYCERIN (NITROSTAT) 0.4 MG SL tablet  Place under the tongue.    . pazopanib (VOTRIENT) 200 MG tablet Take 4 tablets (800 mg total) by mouth daily. Take on an empty stomach. 120 tablet 3  . sertraline (ZOLOFT) 100 MG tablet Take by mouth.    . tamsulosin (FLOMAX) 0.4 MG CAPS capsule Take 0.4 mg by mouth every morning.     . magic mouthwash w/lidocaine SOLN Take 5 mLs by mouth 4 (four) times daily. (Patient not taking: Reported on 08/03/2016) 480 mL 3   No current facility-administered medications for this visit.     PHYSICAL EXAMINATION: ECOG PERFORMANCE STATUS: 0 - Asymptomatic  BP 136/83 (BP Location: Left Arm, Patient Position: Sitting)   Pulse 66   Temp (!) 96.4 F (35.8 C) (Tympanic)   Resp 17   Ht '6\' 1"'$  (1.854 m)   Wt 207 lb 9.6 oz (94.2 kg)   SpO2 98%   BMI 27.39 kg/m   Filed Weights   08/03/16 0955  Weight: 207 lb 9.6 oz (94.2 kg)    GENERAL: Well-nourished well-developed; Alert, no distress and comfortable. Accompanied by wife and daughter. EYES: no pallor or icterus OROPHARYNX: no thrush or ulceration; NECK: supple, no masses felt LYMPH:  no palpable lymphadenopathy in the cervical, axillary or inguinal regions LUNGS: clear to auscultation and  No wheeze or crackles HEART/CVS: regular rate & rhythm and no murmurs; No lower extremity edema ABDOMEN:abdomen soft, non-tender and normal bowel sounds Musculoskeletal:no cyanosis of digits and no clubbing  PSYCH: alert & oriented x 3 with fluent speech NEURO: no focal motor/sensory deficits SKIN:  no rashes or significant lesions; nephrectomy incisions healing well.  LABORATORY DATA:  I have reviewed the data as listed    Component Value Date/Time   NA 135 08/03/2016 0930   NA 146 (H) 05/25/2016 1514   K 3.0 (L) 08/03/2016 0930   CL 102 08/03/2016 0930   CO2 26 08/03/2016 0930   GLUCOSE 133 (H) 08/03/2016 0930   BUN 15 08/03/2016 0930   BUN 19 05/25/2016 1514   CREATININE 1.29 (H) 08/03/2016 0930   CALCIUM 8.6 (L) 08/03/2016 0930   PROT 6.7  08/03/2016 0930   ALBUMIN 3.8 08/03/2016 0930   AST 26 08/03/2016 0930   ALT 29 08/03/2016 0930   ALKPHOS 84 08/03/2016 0930   BILITOT 0.6 08/03/2016 0930   GFRNONAA 53 (L) 08/03/2016 0930   GFRAA >60 08/03/2016 0930    No results found for: SPEP, UPEP  Lab Results  Component Value Date   WBC 5.4 08/03/2016   NEUTROABS 3.4 08/03/2016   HGB 13.8 08/03/2016   HCT 38.0 (L) 08/03/2016   MCV 90.5 08/03/2016   PLT 151 08/03/2016      Chemistry      Component Value Date/Time   NA 135 08/03/2016 0930   NA 146 (H)  05/25/2016 1514   K 3.0 (L) 08/03/2016 0930   CL 102 08/03/2016 0930   CO2 26 08/03/2016 0930   BUN 15 08/03/2016 0930   BUN 19 05/25/2016 1514   CREATININE 1.29 (H) 08/03/2016 0930      Component Value Date/Time   CALCIUM 8.6 (L) 08/03/2016 0930   ALKPHOS 84 08/03/2016 0930   AST 26 08/03/2016 0930   ALT 29 08/03/2016 0930   BILITOT 0.6 08/03/2016 0930      RADIOGRAPHIC STUDIES: I have personally reviewed the radiological images as listed and agreed with the findings in the report. No results found.  IMPRESSION: 1. Progressive metastatic disease to the lungs, with enlargement of most previously noted pulmonary nodules. No new nodules are identified. 2. Status post wedge resection in the anterior aspect of the left upper lobe. 3. Aortic atherosclerosis, in addition to 2 vessel coronary artery disease. Assessment for potential risk factor modification, dietary therapy or pharmacologic therapy may be warranted, if clinically indicated. 4. Saber sheath trachea, mild diffuse bronchial wall thickening and moderate centrilobular and mild paraseptal emphysema; imaging findings suggestive of COPD. 5. Additional incidental findings, as above.   Electronically Signed  By: Vinnie Langton M.D.  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 Stage IV; CT lung- increasing in the size of the lung nodules.   # Recommend re-starting Votrient 200 mg 2 pills a day; if recurrent abdominal pain is an issue/pancreatitis- then recommend discontinuation of Votrient; other options would be opdivo vs. Sutent.   # Abdominal pain- ? Pancreatitis from pazopanib. Currently resolved. Lipase amylase normal.again instructed the patient at length to stop taking his Votrient if noted to have abdominal pain. To call us ASAP.   # repeat labs in 2 w; lipase amylase/LFTs.   # follow up with in MD/  4 weeks/labs.   Orders Placed This Encounter  Procedures  . Hepatic function panel    Standing Status:   Future    Standing Expiration Date:   08/03/2017  . Lipase    Standing Status:   Future    Standing Expiration Date:   08/03/2017  . Amylase    Standing Status:   Future    Standing Expiration Date:   08/03/2017  . CBC with Differential    Standing Status:   Future    Standing Expiration Date:   08/03/2017  . Comprehensive metabolic panel    Standing Status:   Future    Standing Expiration Date:   08/03/2017  . Amylase    Standing Status:   Future    Standing Expiration Date:   08/03/2017  . Lipase    Standing Status:   Future    Standing Expiration Date:   08/03/2017   All questions were answered. The patient knows to call the clinic with any problems, questions or concerns.  # 25 minutes face-to-face with the patient discussing the above plan of care; more than 50% of time spent on prognosis/ natural history; counseling and coordination.      Cammie Sickle, MD 08/03/2016 12:31 PM

## 2016-08-08 ENCOUNTER — Encounter: Payer: Self-pay | Admitting: General Surgery

## 2016-08-08 ENCOUNTER — Ambulatory Visit (INDEPENDENT_AMBULATORY_CARE_PROVIDER_SITE_OTHER): Payer: PPO | Admitting: General Surgery

## 2016-08-08 VITALS — BP 128/80 | HR 62 | Resp 14 | Ht 73.0 in | Wt 207.0 lb

## 2016-08-08 DIAGNOSIS — C642 Malignant neoplasm of left kidney, except renal pelvis: Secondary | ICD-10-CM

## 2016-08-08 NOTE — Patient Instructions (Signed)
Follow up as needed

## 2016-08-08 NOTE — Progress Notes (Signed)
Patient ID: Glen Davis, male   DOB: September 02, 1941, 75 y.o.   MRN: [id number]  Chief Complaint  Patient presents with  . Follow-up    lung biopsy    HPI Glen Davis is a 74 y.o. male here today for a 5 month follow up lung biopsy done on 03/08/16. Patient states he is dong well. Oral lesions for management of his known metastatic renal cancel have been decreased due to suspected pancreatitis. HPI  Past Medical History:  Diagnosis Date  . CAD (coronary artery disease)   . Cancer ([place])    left arm  . Hypertension   . Kidney stone     Past Surgical History:  Procedure Laterality Date  . arm surgery Left    arm  . cardiac stents  2011   Angioplasty / Stenting Femoral-X2  . COLONOSCOPY  04/01/12  . CORONARY ANGIOPLASTY    . EXCISION MELANOMA WITH SENTINEL LYMPH NODE BIOPSY Right 01/24/2016   Procedure: EXCISION MELANOMA Right Shoulder;  Surgeon: Robert Bellow, MD;  Location: [facility] ORS;  Service: General;  Laterality: Right;  . LAPAROSCOPIC NEPHRECTOMY, HAND ASSISTED Left 04/24/2016   Procedure: HAND ASSISTED LAPAROSCOPIC NEPHRECTOMY;  Surgeon: Hollice Espy, MD;  Location: [facility] ORS;  Service: Urology;  Laterality: Left;  Marland Kitchen VIDEO ASSISTED THORACOSCOPY (VATS)/THOROCOTOMY Left 03/08/2016   Procedure: VIDEO ASSISTED THORACOSCOPY (VATS) with lung biopsy - Left ;  Surgeon: Robert Bellow, MD;  Location: [facility] ORS;  Service: General;  Laterality: Left;    Family History  Problem Relation Age of Onset  . Hodgkin's lymphoma Daughter 5  . Kidney cancer Neg Hx   . Kidney disease Neg Hx   . Prostate cancer Neg Hx     Social History Social History  Substance Use Topics  . Smoking status: Former Smoker    Types: Cigarettes    Start date: 01/18/1956    Quit date: 01/17/1969  . Smokeless tobacco: Never Used  . Alcohol use 0.0 oz/week     Comment: BEER OCC    Allergies  Allergen Reactions  . Lisinopril Swelling  . Other Itching    Nitroglycerin Patch.    Current Outpatient  Prescriptions  Medication Sig Dispense Refill  . aspirin EC 81 MG tablet Take 81 mg by mouth daily.    Marland Kitchen atorvastatin (LIPITOR) 40 MG tablet Take 40 mg by mouth daily.    . clopidogrel (PLAVIX) 75 MG tablet Take 75 mg by mouth daily.    Marland Kitchen docusate sodium (COLACE) 100 MG capsule TAKE ONE CAPSULE BY MOUTH TWICE DAILY 60 capsule 0  . losartan (COZAAR) 50 MG tablet Take 50 mg by mouth every morning.     . magic mouthwash w/lidocaine SOLN Take 5 mLs by mouth 4 (four) times daily. 480 mL 3  . metoprolol succinate (TOPROL-XL) 50 MG 24 hr tablet Take 50 mg by mouth every morning.     . nitroGLYCERIN (NITROSTAT) 0.4 MG SL tablet Place under the tongue.    . pazopanib (VOTRIENT) 200 MG tablet Take 4 tablets (800 mg total) by mouth daily. Take on an empty stomach. 120 tablet 3  . sertraline (ZOLOFT) 100 MG tablet Take by mouth.    . tamsulosin (FLOMAX) 0.4 MG CAPS capsule Take 0.4 mg by mouth every morning.      No current facility-administered medications for this visit.     Review of Systems Review of Systems  Constitutional: Negative.   Respiratory: Negative.   Cardiovascular: Negative.     Blood pressure  128/80, pulse 62, resp. rate 14, height '6\' 1"'$  (1.854 m), weight 207 lb (93.9 kg).  Physical Exam Physical Exam  Constitutional: He is oriented to person, place, and time. He appears well-developed and well-nourished.  Eyes: Conjunctivae are normal. No scleral icterus.  Cardiovascular: Normal rate, regular rhythm and normal heart sounds.   Pulmonary/Chest: Effort normal and breath sounds normal.  Port sites well healed.  Neurological: He is alert and oriented to person, place, and time.  Skin: Skin is warm and dry.    Data Reviewed Medical oncology notes.  Assessment    No active issues regarding clinical cardiopulmonary exam    Plan    The patient will continue under the care of medical oncology.    Follow up as needed.  This information has been scribed by Verlene Mayer,  [place], Rebeca 08/08/2016, 12:29 PM

## 2016-08-09 ENCOUNTER — Other Ambulatory Visit: Payer: Self-pay | Admitting: Urology

## 2016-08-14 ENCOUNTER — Telehealth: Payer: Self-pay | Admitting: *Deleted

## 2016-08-14 NOTE — Telephone Encounter (Signed)
Called to report that the Votrient is hurting his stomach, Please call.

## 2016-08-14 NOTE — Telephone Encounter (Signed)
Ask patient to stop taking his Votrient. We will check his labs on Friday as scheduled per v/o Dr. Rogue Bussing

## 2016-08-14 NOTE — Telephone Encounter (Signed)
Call returned to [name] who repeated back to me that he is to stop Votrient

## 2016-08-16 ENCOUNTER — Ambulatory Visit: Payer: PPO | Admitting: General Surgery

## 2016-08-17 ENCOUNTER — Encounter: Payer: Self-pay | Admitting: Internal Medicine

## 2016-08-17 ENCOUNTER — Other Ambulatory Visit: Payer: Self-pay

## 2016-08-17 ENCOUNTER — Inpatient Hospital Stay: Payer: PPO

## 2016-08-17 ENCOUNTER — Other Ambulatory Visit: Payer: Self-pay | Admitting: *Deleted

## 2016-08-17 ENCOUNTER — Inpatient Hospital Stay (HOSPITAL_BASED_OUTPATIENT_CLINIC_OR_DEPARTMENT_OTHER): Payer: PPO | Admitting: Internal Medicine

## 2016-08-17 VITALS — BP 132/83 | HR 97 | Temp 95.9°F | Resp 19 | Ht 73.0 in | Wt 203.8 lb

## 2016-08-17 DIAGNOSIS — C642 Malignant neoplasm of left kidney, except renal pelvis: Secondary | ICD-10-CM

## 2016-08-17 DIAGNOSIS — E781 Pure hyperglyceridemia: Secondary | ICD-10-CM

## 2016-08-17 DIAGNOSIS — C7800 Secondary malignant neoplasm of unspecified lung: Secondary | ICD-10-CM

## 2016-08-17 DIAGNOSIS — R109 Unspecified abdominal pain: Secondary | ICD-10-CM | POA: Diagnosis not present

## 2016-08-17 DIAGNOSIS — Z87891 Personal history of nicotine dependence: Secondary | ICD-10-CM

## 2016-08-17 DIAGNOSIS — I10 Essential (primary) hypertension: Secondary | ICD-10-CM

## 2016-08-17 DIAGNOSIS — Z7982 Long term (current) use of aspirin: Secondary | ICD-10-CM

## 2016-08-17 DIAGNOSIS — Z807 Family history of other malignant neoplasms of lymphoid, hematopoietic and related tissues: Secondary | ICD-10-CM

## 2016-08-17 DIAGNOSIS — R197 Diarrhea, unspecified: Secondary | ICD-10-CM

## 2016-08-17 DIAGNOSIS — C4359 Malignant melanoma of other part of trunk: Secondary | ICD-10-CM | POA: Diagnosis not present

## 2016-08-17 DIAGNOSIS — R112 Nausea with vomiting, unspecified: Secondary | ICD-10-CM

## 2016-08-17 DIAGNOSIS — R55 Syncope and collapse: Secondary | ICD-10-CM

## 2016-08-17 DIAGNOSIS — I2510 Atherosclerotic heart disease of native coronary artery without angina pectoris: Secondary | ICD-10-CM

## 2016-08-17 DIAGNOSIS — Z79899 Other long term (current) drug therapy: Secondary | ICD-10-CM

## 2016-08-17 DIAGNOSIS — J439 Emphysema, unspecified: Secondary | ICD-10-CM

## 2016-08-17 DIAGNOSIS — Z87442 Personal history of urinary calculi: Secondary | ICD-10-CM

## 2016-08-17 LAB — HEPATIC FUNCTION PANEL
ALBUMIN: 3.9 g/dL (ref 3.5–5.0)
ALK PHOS: 82 U/L (ref 38–126)
ALT: 27 U/L (ref 17–63)
AST: 25 U/L (ref 15–41)
BILIRUBIN DIRECT: 0.1 mg/dL (ref 0.1–0.5)
BILIRUBIN TOTAL: 0.8 mg/dL (ref 0.3–1.2)
Indirect Bilirubin: 0.7 mg/dL (ref 0.3–0.9)
Total Protein: 6.8 g/dL (ref 6.5–8.1)

## 2016-08-17 LAB — LIPASE, BLOOD: Lipase: 46 U/L (ref 11–51)

## 2016-08-17 LAB — AMYLASE: AMYLASE: 87 U/L (ref 28–100)

## 2016-08-17 NOTE — Assessment & Plan Note (Addendum)
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   # Patient had multiple intolerance to Votrient-abdominal pain/acute pancreatitis [elevation of lipase and amylase]; diarrhea- overall feeling poorly. Discontinue Votrient.  # Discussed use of immunotherapy with Opdivo every 2 weeks.  I discussed the mechanism of action; The goal of therapy is palliative; and length of treatments are likely ongoing/based upon the results of the scans.   # Discussed the potential side effects of immunotherapy including but not limited to diarrhea; skin rash; elevated LFTs/endocrine abnormalities etc.  # Syncopal episode- unclear etiology check MRI of the brain  # Recommend a baseline CT scan chest without contrast prior to next visit/MRI brain. Follow-up with me in 3 weeks with cycle #2 of Opdivo; labs TSH.

## 2016-08-17 NOTE — Progress Notes (Signed)
START ON PATHWAY REGIMEN - Renal Cell  RCOS38: Nivolumab 240 mg q14 Days   A cycle is every 14 days:     Nivolumab (Opdivo(R)) 240 mg flat dose in 100 mL NS IV over 60 minutes. Inline filter required (low protein binding) Dose Mod: None Additional Orders: Severe immune-mediated reactions can occur (e.g. pneumonitis, colitis, and hepatitis). See prescribing information for more details including monitoring and required immediate management with steroids. Monitor thyroid, renal, liver  function tests, glucose, and sodium at baseline and periodically during therapy.  **Always confirm dose/schedule in your pharmacy ordering system**    Patient Characteristics: Metastatic, Clear Cell, Second Line, Prior Anti-Angiogenic Treatment AJCC Stage Grouping: IV Current evidence of distant metastases? Yes AJCC T Stage: X AJCC N Stage: X AJCC M Stage: X Does patient have oligometastatic disease? No Would you be surprised if this patient died  in the next year? I would be surprised if this patient died in the next year Histology: Clear Cell Line of therapy: Second Line  Intent of Therapy: Non-Curative / Palliative Intent, Discussed with Patient

## 2016-08-17 NOTE — Progress Notes (Signed)
[place] OFFICE PROGRESS NOTE  Patient Care Team: Dion Body, MD as PCP - General (Family Medicine) Jannet Mantis, MD (Dermatology) Robert Bellow, MD (General Surgery) Hollice Espy, MD as Consulting Physician (Urology)  No matching staging information was found for the patient.   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  male pleasant patient above history of Newly diagnosed renal cell cancer status post left nephrectomy- With metastases to the lung; started on Votrient August 7 is here for follow-up.   Patient's dose of Votrient was decreased Few weeks ago; when he started having problems with abdominal pain/increasing amylase lipase. Patient has been on 2 pills of Votrient for the last 1 week; and again noted to have worsening abdominal pain; nausea vomiting and diarrhea.   Patient states that he had an episode of syncope lasting for few seconds; witnessed by his wife while in the yard. Denies any headaches. Denies any seizure-like activity.  He is fairly active. Denies any unusual shortness of breath or cough.   REVIEW OF SYSTEMS:  A complete 10 point review of system is done which is negative  except mentioned above/history of present illness.   PAST MEDICAL HISTORY :  Past Medical History:  Diagnosis Date  . CAD (coronary artery disease)   . Cancer ([place])    left arm  . Hypertension   . Kidney stone     PAST SURGICAL HISTORY :   Past Surgical History:  Procedure Laterality Date  . arm surgery Left    arm  . cardiac stents  2011   Angioplasty / Stenting Femoral-X2  . COLONOSCOPY  04/01/12  . CORONARY ANGIOPLASTY    . EXCISION MELANOMA WITH SENTINEL LYMPH NODE BIOPSY Right 01/24/2016   Procedure: EXCISION MELANOMA Right Shoulder;  Surgeon: Robert Bellow, MD;  Location: [facility] ORS;  Service: General;  Laterality: Right;  . LAPAROSCOPIC NEPHRECTOMY, HAND ASSISTED Left 04/24/2016   Procedure: HAND ASSISTED LAPAROSCOPIC NEPHRECTOMY;  Surgeon: Hollice Espy, MD;  Location: [facility] OR[facility];  Service: Urology;  Laterality: Left;  Marland Kitchen VIDEO ASSISTED THORACOSCOPY (VATS)/THOROCOTOMY Left 03/08/2016   Procedure: VIDEO ASSISTED THORACOSCOPY (VATS) with lung biopsy - Left ;  Surgeon: Robert Bellow, MD;  Location: [facility] ORS;  Service: General;  Laterality: Left;    FAMILY HISTORY :   Family History  Problem Relation Age of Onset  . Hodgkin's lymphoma Daughter 5  . Kidney cancer Neg Hx   . Kidney disease Neg Hx   . Prostate cancer Neg Hx     SOCIAL HISTORY:   Social History  Substance Use Topics  . Smoking status: Former Smoker    Types: Cigarettes    Start date:  01/18/1956    Quit date: 01/17/1969  . Smokeless tobacco: Never Used  . Alcohol use No     Comment: BEER OCC    ALLERGIES:  is allergic to lisinopril and other.  MEDICATIONS:  Current Outpatient Prescriptions  Medication Sig Dispense Refill  . aspirin EC 81 MG tablet Take 81 mg by mouth daily.    Marland Kitchen atorvastatin (LIPITOR) 40 MG tablet Take 40 mg by mouth daily.    . clopidogrel (PLAVIX) 75 MG tablet Take 75 mg by mouth daily.    Marland Kitchen docusate sodium (COLACE) 100 MG capsule TAKE ONE CAPSULE BY MOUTH TWICE DAILY 60 capsule 0   . losartan (COZAAR) 50 MG tablet Take 50 mg by mouth every morning.     . magic mouthwash w/lidocaine SOLN Take 5 mLs by mouth 4 (four) times daily. 480 mL 3  . metoprolol succinate (TOPROL-XL) 50 MG 24 hr tablet Take 50 mg by mouth every morning.     . pazopanib (VOTRIENT) 200 MG tablet Take 4 tablets (800 mg total) by mouth daily. Take on an empty stomach. 120 tablet 3  . sertraline (ZOLOFT) 100 MG tablet Take by mouth.    . tamsulosin (FLOMAX) 0.4 MG CAPS capsule Take 0.4 mg by mouth every morning.     . nitroGLYCERIN (NITROSTAT) 0.4 MG SL tablet Place under the tongue.     No current facility-administered medications for this visit.     PHYSICAL EXAMINATION: ECOG PERFORMANCE STATUS: 0 - Asymptomatic  BP 132/83   Pulse 97   Temp (!) 95.9 F (35.5 C) (Tympanic)   Resp 19   Ht '6\' 1"'$  (1.854 m)   Wt 203 lb 12.8 oz (92.4 kg)   BMI 26.89 kg/m   Filed Weights   08/17/16 1136  Weight: 203 lb 12.8 oz (92.4 kg)    GENERAL: Well-nourished well-developed; Alert, no distress and comfortable. Accompanied by wife.  EYES: no pallor or icterus OROPHARYNX: no thrush or ulceration; NECK: supple, no masses felt LYMPH:  no palpable lymphadenopathy in the cervical, axillary or inguinal regions LUNGS: clear to auscultation and  No wheeze or crackles HEART/CVS: regular rate & rhythm and no murmurs; No lower extremity edema ABDOMEN:abdomen soft, non-tender and normal bowel sounds Musculoskeletal:no cyanosis of digits and no clubbing  PSYCH: alert & oriented x 3 with fluent speech NEURO: no focal motor/sensory deficits SKIN:  no rashes or significant lesions; nephrectomy incisions healing well.  LABORATORY DATA:  I have reviewed the data as listed    Component Value Date/Time   NA 135 08/03/2016 0930   NA 146 (H) 05/25/2016 1514   K 3.0 (L) 08/03/2016 0930   CL 102 08/03/2016 0930   CO2 26 08/03/2016 0930   GLUCOSE 133 (H) 08/03/2016 0930   BUN 15 08/03/2016 0930   BUN 19 05/25/2016  1514   CREATININE 1.29 (H) 08/03/2016 0930   CALCIUM 8.6 (L) 08/03/2016 0930   PROT 6.8 08/17/2016 0954   ALBUMIN 3.9 08/17/2016 0954   AST 25 08/17/2016 0954   ALT 27 08/17/2016 0954   ALKPHOS 82 08/17/2016 0954   BILITOT 0.8 08/17/2016 0954   GFRNONAA 53 (L) 08/03/2016 0930   GFRAA >60 08/03/2016 0930    No results found for: SPEP, UPEP  Lab Results  Component Value Date   WBC 5.4 08/03/2016   NEUTROABS 3.4 08/03/2016   HGB 13.8 08/03/2016   HCT 38.0 (L) 08/03/2016   MCV 90.5 08/03/2016   PLT 151 08/03/2016  Chemistry      Component Value Date/Time   NA 135 08/03/2016 0930   NA 146 (H) 05/25/2016 1514   K 3.0 (L) 08/03/2016 0930   CL 102 08/03/2016 0930   CO2 26 08/03/2016 0930   BUN 15 08/03/2016 0930   BUN 19 05/25/2016 1514   CREATININE 1.29 (H) 08/03/2016 0930      Component Value Date/Time   CALCIUM 8.6 (L) 08/03/2016 0930   ALKPHOS 82 08/17/2016 0954   AST 25 08/17/2016 0954   ALT 27 08/17/2016 0954   BILITOT 0.8 08/17/2016 0954      RADIOGRAPHIC STUDIES: I have personally reviewed the radiological images as listed and agreed with the findings in the report. No results found.  IMPRESSION: 1. Progressive metastatic disease to the lungs, with enlargement of most previously noted pulmonary nodules. No new nodules are identified. 2. Status post wedge resection in the anterior aspect of the left upper lobe. 3. Aortic atherosclerosis, in addition to 2 vessel coronary artery disease. Assessment for potential risk factor modification, dietary therapy or pharmacologic therapy may be warranted, if clinically indicated. 4. Saber sheath trachea, mild diffuse bronchial wall thickening and moderate centrilobular and mild paraseptal emphysema; imaging findings suggestive of COPD. 5. Additional incidental findings, as above.   Electronically Signed  By: Vinnie Langton M.D.  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   # Patient had multiple intolerance to Votrient-abdominal pain/acute pancreatitis [elevation of lipase and amylase]; diarrhea- overall feeling poorly. Discontinue Votrient.  # Discussed use of immunotherapy with Opdivo every 2 weeks.  I discussed the mechanism of action; The goal of therapy is palliative; and length of treatments are likely ongoing/based upon the results of the scans.   # Discussed the potential side effects of immunotherapy including but not limited to diarrhea; skin rash; elevated LFTs/endocrine abnormalities etc.  # Syncopal episode- unclear etiology check MRI of the brain  # Recommend a baseline CT scan chest without contrast prior to next visit/MRI brain. Follow-up with me in 3 weeks with cycle #2 of Opdivo; labs TSH.   Orders Placed This Encounter  Procedures  . CT CHEST WO CONTRAST    Standing Status:   Future    Standing Expiration Date:   10/17/2017    Order Specific Question:   Reason for Exam (SYMPTOM  OR DIAGNOSIS REQUIRED)    Answer:   kidney cancer with mets to lung    Order Specific Question:   Preferred imaging location?    Answer:   [facility]  . MR Brain W Wo Contrast    Standing Status:   Future    Standing Expiration Date:   10/17/2017    Order Specific Question:   Reason for Exam (SYMPTOM  OR DIAGNOSIS REQUIRED)    Answer:   kidney cancer with mets to lung    Order Specific Question:   Preferred imaging location?    Answer:   [facility]    Order Specific Question:   Does the patient have a pacemaker or implanted devices?    Answer:   No    Order Specific Question:   What is the patient's sedation requirement?    Answer:   No Sedation      Cammie Sickle, MD 08/17/2016 12:24 PM

## 2016-08-17 NOTE — Progress Notes (Signed)
Pt reported he had diarrhea since he was on chemo pills.  Had a fall on 9/27.  Passed out yesterday on 9/28 with a fall and hit head.  PT reports he didn't seek medical attention.  Has multiple scratches and cuts on top of left arm and bottom and a place on the back of head bruised.

## 2016-08-21 DIAGNOSIS — L570 Actinic keratosis: Secondary | ICD-10-CM | POA: Diagnosis not present

## 2016-08-21 DIAGNOSIS — Z85820 Personal history of malignant melanoma of skin: Secondary | ICD-10-CM | POA: Diagnosis not present

## 2016-08-21 DIAGNOSIS — Z08 Encounter for follow-up examination after completed treatment for malignant neoplasm: Secondary | ICD-10-CM | POA: Diagnosis not present

## 2016-08-21 NOTE — Patient Instructions (Signed)
Nivolumab injection What is this medicine? NIVOLUMAB (nye VOL ue mab) is a monoclonal antibody. It is used to treat melanoma, lung cancer, kidney cancer, and Hodgkin lymphoma. This medicine may be used for other purposes; ask your health care provider or pharmacist if you have questions. What should I tell my health care provider before I take this medicine? They need to know if you have any of these conditions: -diabetes -immune system problems -kidney disease -liver disease -lung disease -organ transplant -stomach or intestine problems -thyroid disease -an unusual or allergic reaction to nivolumab, other medicines, foods, dyes, or preservatives -pregnant or trying to get pregnant -breast-feeding How should I use this medicine? This medicine is for infusion into a vein. It is given by a health care professional in a hospital or clinic setting. A special MedGuide will be given to you before each treatment. Be sure to read this information carefully each time. Talk to your pediatrician regarding the use of this medicine in children. Special care may be needed. Overdosage: If you think you have taken too much of this medicine contact a poison control center or emergency room at once. NOTE: This medicine is only for you. Do not share this medicine with others. What if I miss a dose? It is important not to miss your dose. Call your doctor or health care professional if you are unable to keep an appointment. What may interact with this medicine? Interactions have not been studied. Give your health care provider a list of all the medicines, herbs, non-prescription drugs, or dietary supplements you use. Also tell them if you smoke, drink alcohol, or use illegal drugs. Some items may interact with your medicine. This list may not describe all possible interactions. Give your health care provider a list of all the medicines, herbs, non-prescription drugs, or dietary supplements you use. Also tell  them if you smoke, drink alcohol, or use illegal drugs. Some items may interact with your medicine. What should I watch for while using this medicine? This drug may make you feel generally unwell. Continue your course of treatment even though you feel ill unless your doctor tells you to stop. You may need blood work done while you are taking this medicine. Do not become pregnant while taking this medicine or for 5 months after stopping it. Women should inform their doctor if they wish to become pregnant or think they might be pregnant. There is a potential for serious side effects to an unborn child. Talk to your health care professional or pharmacist for more information. Do not breast-feed an infant while taking this medicine. What side effects may I notice from receiving this medicine? Side effects that you should report to your doctor or health care professional as soon as possible: -allergic reactions like skin rash, itching or hives, swelling of the face, lips, or tongue -black, tarry stools -blood in the urine -bloody or watery diarrhea -changes in vision -change in sex drive -changes in emotions or moods -chest pain -confusion -cough -decreased appetite -diarrhea -facial flushing -feeling faint or lightheaded -fever, chills -hair loss -hallucination, loss of contact with reality -headache -irritable -joint pain -loss of memory -muscle pain -muscle weakness -seizures -shortness of breath -signs and symptoms of high blood sugar such as dizziness; dry mouth; dry skin; fruity breath; nausea; stomach pain; increased hunger or thirst; increased urination -signs and symptoms of kidney injury like trouble passing urine or change in the amount of urine -signs and symptoms of liver injury like dark yellow or  brown urine; general ill feeling or flu-like symptoms; light-colored stools; loss of appetite; nausea; right upper belly pain; unusually weak or tired; yellowing of the eyes or  skin -stiff neck -swelling of the ankles, feet, hands -weight gain Side effects that usually do not require medical attention (report to your doctor or health care professional if they continue or are bothersome): -bone pain -constipation -tiredness -vomiting This list may not describe all possible side effects. Call your doctor for medical advice about side effects. You may report side effects to FDA at 1-800-FDA-1088. Where should I keep my medicine? This drug is given in a hospital or clinic and will not be stored at home. NOTE: This sheet is a summary. It may not cover all possible information. If you have questions about this medicine, talk to your doctor, pharmacist, or health care provider.    2016, Elsevier/Gold Standard. (2015-04-06 10:03:42)

## 2016-08-23 ENCOUNTER — Inpatient Hospital Stay: Payer: PPO | Attending: Internal Medicine

## 2016-08-23 ENCOUNTER — Other Ambulatory Visit: Payer: Self-pay | Admitting: Internal Medicine

## 2016-08-23 DIAGNOSIS — Z87891 Personal history of nicotine dependence: Secondary | ICD-10-CM | POA: Insufficient documentation

## 2016-08-23 DIAGNOSIS — Z79899 Other long term (current) drug therapy: Secondary | ICD-10-CM | POA: Insufficient documentation

## 2016-08-23 DIAGNOSIS — Z7982 Long term (current) use of aspirin: Secondary | ICD-10-CM | POA: Insufficient documentation

## 2016-08-23 DIAGNOSIS — I2510 Atherosclerotic heart disease of native coronary artery without angina pectoris: Secondary | ICD-10-CM | POA: Insufficient documentation

## 2016-08-23 DIAGNOSIS — C4359 Malignant melanoma of other part of trunk: Secondary | ICD-10-CM | POA: Insufficient documentation

## 2016-08-23 DIAGNOSIS — C7800 Secondary malignant neoplasm of unspecified lung: Secondary | ICD-10-CM | POA: Insufficient documentation

## 2016-08-23 DIAGNOSIS — C642 Malignant neoplasm of left kidney, except renal pelvis: Secondary | ICD-10-CM | POA: Insufficient documentation

## 2016-08-23 DIAGNOSIS — I10 Essential (primary) hypertension: Secondary | ICD-10-CM | POA: Insufficient documentation

## 2016-08-23 DIAGNOSIS — R0602 Shortness of breath: Secondary | ICD-10-CM | POA: Insufficient documentation

## 2016-08-23 DIAGNOSIS — R05 Cough: Secondary | ICD-10-CM | POA: Insufficient documentation

## 2016-08-23 DIAGNOSIS — Z905 Acquired absence of kidney: Secondary | ICD-10-CM | POA: Insufficient documentation

## 2016-08-23 DIAGNOSIS — Z5112 Encounter for antineoplastic immunotherapy: Secondary | ICD-10-CM | POA: Insufficient documentation

## 2016-08-23 DIAGNOSIS — Z87442 Personal history of urinary calculi: Secondary | ICD-10-CM | POA: Insufficient documentation

## 2016-08-24 ENCOUNTER — Inpatient Hospital Stay: Payer: PPO

## 2016-08-24 VITALS — BP 143/88 | HR 62 | Temp 97.2°F | Resp 18

## 2016-08-24 DIAGNOSIS — Z5112 Encounter for antineoplastic immunotherapy: Secondary | ICD-10-CM | POA: Diagnosis not present

## 2016-08-24 DIAGNOSIS — Z79899 Other long term (current) drug therapy: Secondary | ICD-10-CM | POA: Diagnosis not present

## 2016-08-24 DIAGNOSIS — I10 Essential (primary) hypertension: Secondary | ICD-10-CM | POA: Diagnosis not present

## 2016-08-24 DIAGNOSIS — C7800 Secondary malignant neoplasm of unspecified lung: Secondary | ICD-10-CM | POA: Diagnosis not present

## 2016-08-24 DIAGNOSIS — C642 Malignant neoplasm of left kidney, except renal pelvis: Secondary | ICD-10-CM

## 2016-08-24 DIAGNOSIS — I2510 Atherosclerotic heart disease of native coronary artery without angina pectoris: Secondary | ICD-10-CM | POA: Diagnosis not present

## 2016-08-24 DIAGNOSIS — R0602 Shortness of breath: Secondary | ICD-10-CM | POA: Diagnosis not present

## 2016-08-24 DIAGNOSIS — Z7982 Long term (current) use of aspirin: Secondary | ICD-10-CM | POA: Diagnosis not present

## 2016-08-24 DIAGNOSIS — C4359 Malignant melanoma of other part of trunk: Secondary | ICD-10-CM | POA: Diagnosis not present

## 2016-08-24 DIAGNOSIS — Z87442 Personal history of urinary calculi: Secondary | ICD-10-CM | POA: Diagnosis not present

## 2016-08-24 DIAGNOSIS — R05 Cough: Secondary | ICD-10-CM | POA: Diagnosis not present

## 2016-08-24 DIAGNOSIS — Z87891 Personal history of nicotine dependence: Secondary | ICD-10-CM | POA: Diagnosis not present

## 2016-08-24 DIAGNOSIS — Z905 Acquired absence of kidney: Secondary | ICD-10-CM | POA: Diagnosis not present

## 2016-08-24 MED ORDER — SODIUM CHLORIDE 0.9 % IV SOLN
Freq: Once | INTRAVENOUS | Status: AC
  Administered 2016-08-24: 10:00:00 via INTRAVENOUS
  Filled 2016-08-24: qty 1000

## 2016-08-24 MED ORDER — SODIUM CHLORIDE 0.9 % IV SOLN
240.0000 mg | Freq: Once | INTRAVENOUS | Status: AC
  Administered 2016-08-24: 240 mg via INTRAVENOUS
  Filled 2016-08-24: qty 4

## 2016-08-30 ENCOUNTER — Ambulatory Visit
Admission: RE | Admit: 2016-08-30 | Discharge: 2016-08-30 | Disposition: A | Discharge status: Home / Self Care | Payer: PPO | Source: Ambulatory Visit | Attending: Internal Medicine | Admitting: Internal Medicine

## 2016-08-30 DIAGNOSIS — R918 Other nonspecific abnormal finding of lung field: Secondary | ICD-10-CM | POA: Insufficient documentation

## 2016-08-30 DIAGNOSIS — C642 Malignant neoplasm of left kidney, except renal pelvis: Secondary | ICD-10-CM | POA: Diagnosis not present

## 2016-08-30 DIAGNOSIS — R55 Syncope and collapse: Secondary | ICD-10-CM | POA: Diagnosis not present

## 2016-08-30 DIAGNOSIS — I700 Atherosclerosis of aorta: Secondary | ICD-10-CM | POA: Insufficient documentation

## 2016-08-30 DIAGNOSIS — C649 Malignant neoplasm of unspecified kidney, except renal pelvis: Secondary | ICD-10-CM | POA: Diagnosis not present

## 2016-08-30 DIAGNOSIS — J439 Emphysema, unspecified: Secondary | ICD-10-CM | POA: Insufficient documentation

## 2016-08-30 MED ORDER — GADOBENATE DIMEGLUMINE 529 MG/ML IV SOLN
20.0000 mL | Freq: Once | INTRAVENOUS | Status: AC | PRN
  Administered 2016-08-30: 20 mL via INTRAVENOUS

## 2016-08-31 ENCOUNTER — Ambulatory Visit: Payer: PPO | Admitting: Internal Medicine

## 2016-08-31 ENCOUNTER — Other Ambulatory Visit: Payer: PPO

## 2016-08-31 ENCOUNTER — Telehealth: Payer: Self-pay | Admitting: *Deleted

## 2016-08-31 NOTE — Telephone Encounter (Signed)
-----   Message from Cammie Sickle, MD sent at 08/30/2016  5:41 PM EDT ----- Please inform patient that MRI brain negative. Follow-up as planned.

## 2016-08-31 NOTE — Telephone Encounter (Signed)
-----   Message from Cammie Sickle, MD sent at 08/30/2016  5:49 PM EDT ----- Please inform chest CT-shows stable lung nodules/ unchanged from his previous scan.

## 2016-08-31 NOTE — Telephone Encounter (Signed)
Contacted the patient. Mail box full unable to leave vm on home phone.  Unable to reach patient. Daughter answered patient cell phone and states that she will give her dad a msg that the doctor's office was calling and have him contact our office back.  Pt not at residence.

## 2016-09-13 ENCOUNTER — Inpatient Hospital Stay: Payer: PPO

## 2016-09-13 ENCOUNTER — Telehealth: Payer: Self-pay | Admitting: *Deleted

## 2016-09-13 ENCOUNTER — Other Ambulatory Visit: Payer: Self-pay

## 2016-09-13 ENCOUNTER — Inpatient Hospital Stay (HOSPITAL_BASED_OUTPATIENT_CLINIC_OR_DEPARTMENT_OTHER): Payer: PPO | Admitting: Internal Medicine

## 2016-09-13 VITALS — BP 122/77 | HR 52 | Temp 96.9°F | Resp 18 | Wt 213.2 lb

## 2016-09-13 DIAGNOSIS — Z87891 Personal history of nicotine dependence: Secondary | ICD-10-CM

## 2016-09-13 DIAGNOSIS — Z7982 Long term (current) use of aspirin: Secondary | ICD-10-CM

## 2016-09-13 DIAGNOSIS — C642 Malignant neoplasm of left kidney, except renal pelvis: Secondary | ICD-10-CM | POA: Diagnosis not present

## 2016-09-13 DIAGNOSIS — C7800 Secondary malignant neoplasm of unspecified lung: Secondary | ICD-10-CM | POA: Diagnosis not present

## 2016-09-13 DIAGNOSIS — Z87442 Personal history of urinary calculi: Secondary | ICD-10-CM

## 2016-09-13 DIAGNOSIS — Z905 Acquired absence of kidney: Secondary | ICD-10-CM

## 2016-09-13 DIAGNOSIS — C4359 Malignant melanoma of other part of trunk: Secondary | ICD-10-CM

## 2016-09-13 DIAGNOSIS — R918 Other nonspecific abnormal finding of lung field: Secondary | ICD-10-CM

## 2016-09-13 DIAGNOSIS — R0602 Shortness of breath: Secondary | ICD-10-CM

## 2016-09-13 DIAGNOSIS — I10 Essential (primary) hypertension: Secondary | ICD-10-CM

## 2016-09-13 DIAGNOSIS — Z5112 Encounter for antineoplastic immunotherapy: Secondary | ICD-10-CM | POA: Diagnosis not present

## 2016-09-13 DIAGNOSIS — Z79899 Other long term (current) drug therapy: Secondary | ICD-10-CM

## 2016-09-13 DIAGNOSIS — R05 Cough: Secondary | ICD-10-CM

## 2016-09-13 DIAGNOSIS — I2510 Atherosclerotic heart disease of native coronary artery without angina pectoris: Secondary | ICD-10-CM

## 2016-09-13 LAB — CBC WITH DIFFERENTIAL/PLATELET
BASOS ABS: 0 10*3/uL (ref 0–0.1)
BASOS PCT: 1 %
EOS PCT: 2 %
Eosinophils Absolute: 0.2 10*3/uL (ref 0–0.7)
HCT: 38.2 % — ABNORMAL LOW (ref 40.0–52.0)
Hemoglobin: 13.5 g/dL (ref 13.0–18.0)
Lymphocytes Relative: 19 %
Lymphs Abs: 1.2 10*3/uL (ref 1.0–3.6)
MCH: 32.6 pg (ref 26.0–34.0)
MCHC: 35.4 g/dL (ref 32.0–36.0)
MCV: 91.9 fL (ref 80.0–100.0)
MONO ABS: 0.7 10*3/uL (ref 0.2–1.0)
Monocytes Relative: 11 %
Neutro Abs: 4.4 10*3/uL (ref 1.4–6.5)
Neutrophils Relative %: 67 %
PLATELETS: 146 10*3/uL — AB (ref 150–440)
RBC: 4.15 MIL/uL — ABNORMAL LOW (ref 4.40–5.90)
RDW: 16.2 % — AB (ref 11.5–14.5)
WBC: 6.6 10*3/uL (ref 3.8–10.6)

## 2016-09-13 LAB — COMPREHENSIVE METABOLIC PANEL
ALT: 29 U/L (ref 17–63)
ANION GAP: 8 (ref 5–15)
AST: 26 U/L (ref 15–41)
Albumin: 4 g/dL (ref 3.5–5.0)
Alkaline Phosphatase: 78 U/L (ref 38–126)
BUN: 17 mg/dL (ref 6–20)
CHLORIDE: 100 mmol/L — AB (ref 101–111)
CO2: 26 mmol/L (ref 22–32)
Calcium: 8.9 mg/dL (ref 8.9–10.3)
Creatinine, Ser: 1.34 mg/dL — ABNORMAL HIGH (ref 0.61–1.24)
GFR calc Af Amer: 58 mL/min — ABNORMAL LOW (ref >60)
GFR, EST NON AFRICAN AMERICAN: 50 mL/min — AB (ref >60)
Glucose, Bld: 104 mg/dL — ABNORMAL HIGH (ref 65–99)
POTASSIUM: 4.1 mmol/L (ref 3.5–5.1)
Sodium: 134 mmol/L — ABNORMAL LOW (ref 135–145)
Total Bilirubin: 0.5 mg/dL (ref 0.3–1.2)
Total Protein: 7 g/dL (ref 6.5–8.1)

## 2016-09-13 LAB — TSH: TSH: 4.656 u[IU]/mL — AB (ref 0.350–4.500)

## 2016-09-13 MED ORDER — NIVOLUMAB CHEMO INJECTION 100 MG/10ML
240.0000 mg | Freq: Once | INTRAVENOUS | Status: AC
  Administered 2016-09-13: 240 mg via INTRAVENOUS
  Filled 2016-09-13: qty 20

## 2016-09-13 MED ORDER — SODIUM CHLORIDE 0.9 % IV SOLN
Freq: Once | INTRAVENOUS | Status: AC
  Administered 2016-09-13: 11:00:00 via INTRAVENOUS
  Filled 2016-09-13: qty 1000

## 2016-09-13 NOTE — Assessment & Plan Note (Signed)
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   # SOB- remote history of smoking; recommend PFT  # follow up in 2 weeks/labs/ opdivo.    I reviewed the images myself and with the patient and family in detail.

## 2016-09-13 NOTE — Progress Notes (Signed)
Patient is here for follow up, mentions some side effects of the chemo. SOB with exertion or short distance.

## 2016-09-13 NOTE — Progress Notes (Signed)
[facility] OFFICE PROGRESS NOTE  Patient Care Team: Dion Body, MD as PCP - General (Family Medicine) Jannet Mantis, MD (Dermatology) Robert Bellow, MD (General Surgery) Hollice Espy, MD as Consulting Physician (Urology)  No matching staging information was found for the patient.   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  male pleasant patient above history of Newly diagnosed renal cell cancer status post left nephrectomy- With metastases to the lung- Is currently started on Opdivo status post cycle 1 is here for follow-up/ to also review the results of his baseline CT of the chest and also MRI of the brain.  Complains of intermittent shortness of breath especially exertion. Remote history of smoking. Mild chronic cough. Not any worse. Intermittent headaches. No other syncopal episodes.   He is fairly active. No chest pain. No pain anywhere else. Appetite is good. No weight loss. Weight gain.   REVIEW OF SYSTEMS:  A complete 10 point review of system is done which is negative except mentioned above/history of present illness.   PAST MEDICAL HISTORY :  Past Medical History:  Diagnosis Date  . CAD  (coronary artery disease)   . Cancer ([place])    left arm  . Hypertension   . Kidney stone     PAST SURGICAL HISTORY :   Past Surgical History:  Procedure Laterality Date  . arm surgery Left    arm  . cardiac stents  2011   Angioplasty / Stenting Femoral-X2  . COLONOSCOPY  04/01/12  . CORONARY ANGIOPLASTY    . EXCISION MELANOMA WITH SENTINEL LYMPH NODE BIOPSY Right 01/24/2016   Procedure: EXCISION MELANOMA Right Shoulder;  Surgeon: Robert Bellow, MD;  Location: [facility] ORS;  Service: General;  Laterality: Right;  . LAPAROSCOPIC NEPHRECTOMY, HAND ASSISTED Left 04/24/2016   Procedure: HAND ASSISTED LAPAROSCOPIC NEPHRECTOMY;  Surgeon: Hollice Espy, MD;  Location: [facility] ORS;  Service: Urology;  Laterality: Left;  Marland Kitchen VIDEO ASSISTED THORACOSCOPY (VATS)/THOROCOTOMY Left 03/08/2016   Procedure: VIDEO ASSISTED THORACOSCOPY (VATS) with lung biopsy - Left ;  Surgeon: Robert Bellow, MD;  Location: [facility] ORS;  Service: General;  Laterality: Left;    FAMILY HISTORY :   Family History  Problem Relation Age of Onset  . Hodgkin's lymphoma Daughter 5  . Kidney cancer Neg Hx   . Kidney disease Neg Hx   . Prostate cancer Neg Hx     SOCIAL HISTORY:   Social History  Substance Use Topics  . Smoking status: Former Smoker    Types: Cigarettes    Start date: 01/18/1956    Quit date: 01/17/1969  . Smokeless tobacco: Never Used  . Alcohol use No  Comment: BEER OCC    ALLERGIES:  is allergic to lisinopril and other.  MEDICATIONS:  Current Outpatient Prescriptions  Medication Sig Dispense Refill  . aspirin EC 81 MG tablet Take 81 mg by mouth daily.    Marland Kitchen atorvastatin (LIPITOR) 40 MG tablet Take 40 mg by mouth daily.    . clopidogrel (PLAVIX) 75 MG tablet Take 75 mg by mouth daily.    Marland Kitchen docusate sodium (COLACE) 100 MG capsule TAKE ONE CAPSULE BY MOUTH TWICE DAILY 60 capsule 0  . losartan (COZAAR) 50 MG tablet Take 50 mg by mouth every morning.     Marland Kitchen MELATONIN PO Take by mouth.    . metoprolol  succinate (TOPROL-XL) 50 MG 24 hr tablet Take 50 mg by mouth every morning.     . nitroGLYCERIN (NITROSTAT) 0.4 MG SL tablet Place under the tongue.    . sertraline (ZOLOFT) 100 MG tablet Take by mouth. Takes one and one half tablets daily    . tamsulosin (FLOMAX) 0.4 MG CAPS capsule Take 0.4 mg by mouth every morning.     . magic mouthwash w/lidocaine SOLN Take 5 mLs by mouth 4 (four) times daily. (Patient not taking: Reported on 09/13/2016) 480 mL 3  . pazopanib (VOTRIENT) 200 MG tablet Take 4 tablets (800 mg total) by mouth daily. Take on an empty stomach. (Patient not taking: Reported on 09/13/2016) 120 tablet 3   No current facility-administered medications for this visit.     PHYSICAL EXAMINATION: ECOG PERFORMANCE STATUS: 0 - Asymptomatic  BP 122/77 (BP Location: Left Arm, Patient Position: Sitting)   Pulse (!) 52   Temp (!) 96.9 F (36.1 C)   Resp 18   Wt 213 lb 3.2 oz (96.7 kg)   SpO2 97%   BMI 28.13 kg/m   Filed Weights   09/13/16 1016  Weight: 213 lb 3.2 oz (96.7 kg)    GENERAL: Well-nourished well-developed; Alert, no distress and comfortable. Accompanied by wife.  EYES: no pallor or icterus OROPHARYNX: no thrush or ulceration; NECK: supple, no masses felt LYMPH:  no palpable lymphadenopathy in the cervical, axillary or inguinal regions LUNGS: clear to auscultation and  No wheeze or crackles HEART/CVS: regular rate & rhythm and no murmurs; No lower extremity edema ABDOMEN:abdomen soft, non-tender and normal bowel sounds Musculoskeletal:no cyanosis of digits and no clubbing  PSYCH: alert & oriented x 3 with fluent speech NEURO: no focal motor/sensory deficits SKIN:  no rashes or significant lesions; nephrectomy incisions healing well.  LABORATORY DATA:  I have reviewed the data as listed    Component Value Date/Time   NA 134 (L) 09/13/2016 0940   NA 146 (H) 05/25/2016 1514   K 4.1 09/13/2016 0940   CL 100 (L) 09/13/2016 0940   CO2 26 09/13/2016 0940    GLUCOSE 104 (H) 09/13/2016 0940   BUN 17 09/13/2016 0940   BUN 19 05/25/2016 1514   CREATININE 1.34 (H) 09/13/2016 0940   CALCIUM 8.9 09/13/2016 0940   PROT 7.0 09/13/2016 0940   ALBUMIN 4.0 09/13/2016 0940   AST 26 09/13/2016 0940   ALT 29 09/13/2016 0940   ALKPHOS 78 09/13/2016 0940   BILITOT 0.5 09/13/2016 0940   GFRNONAA 50 (L) 09/13/2016 0940   GFRAA 58 (L) 09/13/2016 0940    No results found for: SPEP, UPEP  Lab Results  Component Value Date   WBC 6.6 09/13/2016   NEUTROABS 4.4 09/13/2016   HGB 13.5 09/13/2016   HCT 38.2 (L) 09/13/2016   MCV 91.9  09/13/2016   PLT 146 (L) 09/13/2016      Chemistry      Component Value Date/Time   NA 134 (L) 09/13/2016 0940   NA 146 (H) 05/25/2016 1514   K 4.1 09/13/2016 0940   CL 100 (L) 09/13/2016 0940   CO2 26 09/13/2016 0940   BUN 17 09/13/2016 0940   BUN 19 05/25/2016 1514   CREATININE 1.34 (H) 09/13/2016 0940      Component Value Date/Time   CALCIUM 8.9 09/13/2016 0940   ALKPHOS 78 09/13/2016 0940   AST 26 09/13/2016 0940   ALT 29 09/13/2016 0940   BILITOT 0.5 09/13/2016 0940     IMPRESSION: Grossly unchanged bilateral pulmonary nodules compatible with metastasis. No new or enlarging pulmonary nodules are identified.  Aortic atherosclerosis.  Emphysematous change.   Electronically Signed   By: Lovey Newcomer M.D.   On: 08/30/2016 15:43 RADIOGRAPHIC STUDIES: I have personally reviewed the radiological images as listed and agreed with the findings in the report. No results found.  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 Stage IV; OCT 12thCT lung- Stable Bil lung nodules [compared to July 2017]  # Currently on OPDIVO; proceed with cycle #2 today.Tolerating well without major sideeffects.   # SOB- remote history of smoking; recommend PFT  # follow up in 2 weeks/labs/ opdivo.    I  reviewed the images myself and with the patient and family in detail.  No orders of the defined types were placed in this encounter.  # I reviewed the blood work- with the patient in detail; also reviewed the imaging independently [as summarized above]; and with the patient in detail.      Cammie Sickle, MD 09/13/2016 3:45 PM

## 2016-09-13 NOTE — Telephone Encounter (Signed)
I spoke with Glen Davis  and explained the CARE (Cancer Associated Rehab and Exercise class) . Glen Davis     will sign an informed consent for the CARE exercise class on  and begin exercising on Tuesday and Thursdays from 12:15pm to 1:30pm (or whatever he can do). I emphasized to [name]  that he only has to attend the CARE exercise class when he feels well but hopefully this will help him with any fatigue etc. Will start Tuesday Sep 18, 2016.

## 2016-09-14 ENCOUNTER — Other Ambulatory Visit: Payer: PPO

## 2016-09-14 ENCOUNTER — Ambulatory Visit: Payer: PPO

## 2016-09-14 ENCOUNTER — Ambulatory Visit: Payer: PPO | Admitting: Internal Medicine

## 2016-09-18 ENCOUNTER — Ambulatory Visit: Payer: PPO | Attending: Internal Medicine

## 2016-09-18 DIAGNOSIS — R918 Other nonspecific abnormal finding of lung field: Secondary | ICD-10-CM | POA: Insufficient documentation

## 2016-09-18 DIAGNOSIS — R0602 Shortness of breath: Secondary | ICD-10-CM | POA: Diagnosis not present

## 2016-09-21 ENCOUNTER — Other Ambulatory Visit: Payer: Self-pay | Admitting: Urology

## 2016-09-21 DIAGNOSIS — K5900 Constipation, unspecified: Secondary | ICD-10-CM

## 2016-09-27 ENCOUNTER — Inpatient Hospital Stay (HOSPITAL_BASED_OUTPATIENT_CLINIC_OR_DEPARTMENT_OTHER): Payer: PPO | Admitting: Internal Medicine

## 2016-09-27 ENCOUNTER — Inpatient Hospital Stay: Payer: PPO

## 2016-09-27 ENCOUNTER — Inpatient Hospital Stay: Payer: PPO | Attending: Internal Medicine

## 2016-09-27 VITALS — BP 139/87 | HR 57 | Temp 97.6°F | Resp 20 | Ht 73.0 in | Wt 214.0 lb

## 2016-09-27 DIAGNOSIS — Z79899 Other long term (current) drug therapy: Secondary | ICD-10-CM | POA: Diagnosis not present

## 2016-09-27 DIAGNOSIS — C4359 Malignant melanoma of other part of trunk: Secondary | ICD-10-CM

## 2016-09-27 DIAGNOSIS — I700 Atherosclerosis of aorta: Secondary | ICD-10-CM

## 2016-09-27 DIAGNOSIS — Z905 Acquired absence of kidney: Secondary | ICD-10-CM | POA: Diagnosis not present

## 2016-09-27 DIAGNOSIS — Z7982 Long term (current) use of aspirin: Secondary | ICD-10-CM | POA: Diagnosis not present

## 2016-09-27 DIAGNOSIS — Z87442 Personal history of urinary calculi: Secondary | ICD-10-CM

## 2016-09-27 DIAGNOSIS — Z807 Family history of other malignant neoplasms of lymphoid, hematopoietic and related tissues: Secondary | ICD-10-CM | POA: Insufficient documentation

## 2016-09-27 DIAGNOSIS — I2510 Atherosclerotic heart disease of native coronary artery without angina pectoris: Secondary | ICD-10-CM

## 2016-09-27 DIAGNOSIS — I129 Hypertensive chronic kidney disease with stage 1 through stage 4 chronic kidney disease, or unspecified chronic kidney disease: Secondary | ICD-10-CM | POA: Insufficient documentation

## 2016-09-27 DIAGNOSIS — C642 Malignant neoplasm of left kidney, except renal pelvis: Secondary | ICD-10-CM

## 2016-09-27 DIAGNOSIS — Z87891 Personal history of nicotine dependence: Secondary | ICD-10-CM | POA: Insufficient documentation

## 2016-09-27 DIAGNOSIS — Z5112 Encounter for antineoplastic immunotherapy: Secondary | ICD-10-CM | POA: Insufficient documentation

## 2016-09-27 DIAGNOSIS — C7800 Secondary malignant neoplasm of unspecified lung: Secondary | ICD-10-CM | POA: Insufficient documentation

## 2016-09-27 DIAGNOSIS — R0602 Shortness of breath: Secondary | ICD-10-CM

## 2016-09-27 DIAGNOSIS — N189 Chronic kidney disease, unspecified: Secondary | ICD-10-CM | POA: Diagnosis not present

## 2016-09-27 LAB — CBC WITH DIFFERENTIAL/PLATELET
BASOS ABS: 0 10*3/uL (ref 0–0.1)
BASOS PCT: 1 %
EOS PCT: 3 %
Eosinophils Absolute: 0.2 10*3/uL (ref 0–0.7)
HCT: 37.5 % — ABNORMAL LOW (ref 40.0–52.0)
Hemoglobin: 13 g/dL (ref 13.0–18.0)
Lymphocytes Relative: 18 %
Lymphs Abs: 1.2 10*3/uL (ref 1.0–3.6)
MCH: 31.8 pg (ref 26.0–34.0)
MCHC: 34.8 g/dL (ref 32.0–36.0)
MCV: 91.5 fL (ref 80.0–100.0)
MONO ABS: 0.6 10*3/uL (ref 0.2–1.0)
Monocytes Relative: 9 %
Neutro Abs: 4.7 10*3/uL (ref 1.4–6.5)
Neutrophils Relative %: 69 %
PLATELETS: 144 10*3/uL — AB (ref 150–440)
RBC: 4.1 MIL/uL — ABNORMAL LOW (ref 4.40–5.90)
RDW: 15.2 % — AB (ref 11.5–14.5)
WBC: 6.8 10*3/uL (ref 3.8–10.6)

## 2016-09-27 LAB — COMPREHENSIVE METABOLIC PANEL
ALBUMIN: 4.2 g/dL (ref 3.5–5.0)
ALT: 28 U/L (ref 17–63)
AST: 30 U/L (ref 15–41)
Alkaline Phosphatase: 72 U/L (ref 38–126)
Anion gap: 5 (ref 5–15)
BUN: 18 mg/dL (ref 6–20)
CHLORIDE: 102 mmol/L (ref 101–111)
CO2: 26 mmol/L (ref 22–32)
Calcium: 8.9 mg/dL (ref 8.9–10.3)
Creatinine, Ser: 1.66 mg/dL — ABNORMAL HIGH (ref 0.61–1.24)
GFR calc Af Amer: 45 mL/min — ABNORMAL LOW (ref >60)
GFR calc non Af Amer: 39 mL/min — ABNORMAL LOW (ref >60)
GLUCOSE: 143 mg/dL — AB (ref 65–99)
POTASSIUM: 4.1 mmol/L (ref 3.5–5.1)
SODIUM: 133 mmol/L — AB (ref 135–145)
Total Bilirubin: 0.5 mg/dL (ref 0.3–1.2)
Total Protein: 7.1 g/dL (ref 6.5–8.1)

## 2016-09-27 MED ORDER — SODIUM CHLORIDE 0.9 % IV SOLN
Freq: Once | INTRAVENOUS | Status: AC
  Administered 2016-09-27: 11:00:00 via INTRAVENOUS
  Filled 2016-09-27: qty 1000

## 2016-09-27 MED ORDER — SODIUM CHLORIDE 0.9 % IV SOLN
240.0000 mg | Freq: Once | INTRAVENOUS | Status: AC
  Administered 2016-09-27: 240 mg via INTRAVENOUS
  Filled 2016-09-27: qty 24

## 2016-09-27 NOTE — Assessment & Plan Note (Addendum)
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 Stage IV; OCT 12thCT lung- Stable Bil lung nodules [compared to July 2017]  # Currently on OPDIVO; proceed with cycle #3 today.Tolerating well without major sideeffects.   # SOB- remote history of smoking; improved. PFT- acceptable.   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   # follow up in 3 weeks/labs/ opdivo. Plan CT after 4 cycles; will order at next visit. Cbc/cmp/Thyroid profile.

## 2016-09-27 NOTE — Progress Notes (Signed)
Creatinine: 1.66. MD, Dr. Rogue Bussing, notified via telephone and already aware. Per MD order: Proceed with treatment today.

## 2016-09-27 NOTE — Progress Notes (Signed)
[facility] OFFICE PROGRESS NOTE  Patient Care Team: Dion Body, MD as PCP - General (Family Medicine) Jannet Mantis, MD (Dermatology) Robert Bellow, MD (General Surgery) Hollice Espy, MD as Consulting Physician (Urology)  No matching staging information was found for the patient.   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  male pleasant patient above history of Newly diagnosed renal cell cancer status post left nephrectomy- With metastases to the lung- Is currently started on Opdivo status post cycle 2.   Denies any chest pain or shortness of breath. Shortness of breath significantly improved. Fatigue significantly improved.   He is fairly active. No chest pain. No pain anywhere else. Appetite is good. No weight loss. Weight gain.   REVIEW OF SYSTEMS:  A complete 10 point review of system is done which is negative except mentioned above/history of present illness.   PAST MEDICAL HISTORY :  Past Medical History:  Diagnosis Date  . CAD (coronary artery disease)   . Cancer ([place])    left arm  . Hypertension   . Kidney stone     PAST SURGICAL HISTORY :   Past Surgical History:  Procedure  Laterality Date  . arm surgery Left    arm  . cardiac stents  2011   Angioplasty / Stenting Femoral-X2  . COLONOSCOPY  04/01/12  . CORONARY ANGIOPLASTY    . EXCISION MELANOMA WITH SENTINEL LYMPH NODE BIOPSY Right 01/24/2016   Procedure: EXCISION MELANOMA Right Shoulder;  Surgeon: Robert Bellow, MD;  Location: [facility] ORS;  Service: General;  Laterality: Right;  . LAPAROSCOPIC NEPHRECTOMY, HAND ASSISTED Left 04/24/2016   Procedure: HAND ASSISTED LAPAROSCOPIC NEPHRECTOMY;  Surgeon: Hollice Espy, MD;  Location: [facility] ORS;  Service: Urology;  Laterality: Left;  Marland Kitchen VIDEO ASSISTED THORACOSCOPY (VATS)/THOROCOTOMY Left 03/08/2016   Procedure: VIDEO ASSISTED THORACOSCOPY (VATS) with lung biopsy - Left ;  Surgeon: Robert Bellow, MD;  Location: [facility] ORS;  Service: General;  Laterality: Left;    FAMILY HISTORY :   Family History  Problem Relation Age of Onset  . Hodgkin's lymphoma Daughter 5  . Kidney cancer Neg Hx   . Kidney disease Neg Hx   . Prostate cancer Neg Hx     SOCIAL HISTORY:   Social History  Substance Use Topics  . Smoking status: Former Smoker    Types: Cigarettes    Start date: 01/18/1956    Quit date: 01/17/1969  . Smokeless tobacco: Never Used  . Alcohol use No     Comment: BEER OCC    ALLERGIES:  is allergic to lisinopril and other.  MEDICATIONS:  Current Outpatient Prescriptions  Medication Sig Dispense Refill  . aspirin  EC 81 MG tablet Take 81 mg by mouth daily.    Marland Kitchen atorvastatin (LIPITOR) 40 MG tablet Take 40 mg by mouth daily.    . clopidogrel (PLAVIX) 75 MG tablet Take 75 mg by mouth daily.    Marland Kitchen docusate sodium (COLACE) 100 MG capsule TAKE ONE CAPSULE BY MOUTH TWICE DAILY 60 capsule 3  . losartan (COZAAR) 50 MG tablet Take 50 mg by mouth every morning.     Marland Kitchen MELATONIN PO Take 1 capsule by mouth at bedtime.     . metoprolol succinate (TOPROL-XL) 50 MG 24 hr tablet Take 50 mg by mouth every morning.     . sertraline (ZOLOFT) 100 MG tablet Take by mouth. Takes one  and one half tablets daily    . tamsulosin (FLOMAX) 0.4 MG CAPS capsule Take 0.4 mg by mouth every morning.     . magic mouthwash w/lidocaine SOLN Take 5 mLs by mouth 4 (four) times daily. (Patient not taking: Reported on 09/27/2016) 480 mL 3  . nitroGLYCERIN (NITROSTAT) 0.4 MG SL tablet Place under the tongue.     No current facility-administered medications for this visit.     PHYSICAL EXAMINATION: ECOG PERFORMANCE STATUS: 0 - Asymptomatic  BP 139/87 (BP Location: Left Arm, Patient Position: Sitting)   Pulse (!) 57   Temp 97.6 F (36.4 C)   Resp 20   Ht '6\' 1"'$  (1.854 m)   Wt 214 lb (97.1 kg)   BMI 28.23 kg/m   Filed Weights   09/27/16 0956  Weight: 214 lb (97.1 kg)    GENERAL: Well-nourished well-developed; Alert, no distress and comfortable. Accompanied by wifeAnd daughter. EYES: no pallor or icterus OROPHARYNX: no thrush or ulceration; NECK: supple, no masses felt LYMPH:  no palpable lymphadenopathy in the cervical, axillary or inguinal regions LUNGS: clear to auscultation and  No wheeze or crackles HEART/CVS: regular rate & rhythm and no murmurs; No lower extremity edema ABDOMEN:abdomen soft, non-tender and normal bowel sounds Musculoskeletal:no cyanosis of digits and no clubbing  PSYCH: alert & oriented x 3 with fluent speech NEURO: no focal motor/sensory deficits SKIN:  no rashes or significant lesions; nephrectomy incisions healing well.  LABORATORY DATA:  I have reviewed the data as listed    Component Value Date/Time   NA 133 (L) 09/27/2016 0925   NA 146 (H) 05/25/2016 1514   K 4.1 09/27/2016 0925   CL 102 09/27/2016 0925   CO2 26 09/27/2016 0925   GLUCOSE 143 (H) 09/27/2016 0925   BUN 18 09/27/2016 0925   BUN 19 05/25/2016 1514   CREATININE 1.66 (H) 09/27/2016 0925   CALCIUM 8.9 09/27/2016 0925   PROT 7.1 09/27/2016 0925   ALBUMIN 4.2 09/27/2016 0925   AST 30 09/27/2016 0925   ALT 28 09/27/2016 0925   ALKPHOS 72 09/27/2016 0925   BILITOT 0.5  09/27/2016 0925   GFRNONAA 39 (L) 09/27/2016 0925   GFRAA 45 (L) 09/27/2016 0925    No results found for: SPEP, UPEP  Lab Results  Component Value Date   WBC 6.8 09/27/2016   NEUTROABS 4.7 09/27/2016   HGB 13.0 09/27/2016   HCT 37.5 (L) 09/27/2016   MCV 91.5 09/27/2016   PLT 144 (L) 09/27/2016      Chemistry      Component Value Date/Time   NA 133 (L) 09/27/2016 0925   NA 146 (H) 05/25/2016 1514   K 4.1 09/27/2016 0925   CL 102 09/27/2016 0925   CO2 26 09/27/2016 0925   BUN  18 09/27/2016 0925   BUN 19 05/25/2016 1514   CREATININE 1.66 (H) 09/27/2016 0925      Component Value Date/Time   CALCIUM 8.9 09/27/2016 0925   ALKPHOS 72 09/27/2016 0925   AST 30 09/27/2016 0925   ALT 28 09/27/2016 0925   BILITOT 0.5 09/27/2016 0925     IMPRESSION: Grossly unchanged bilateral pulmonary nodules compatible with metastasis. No new or enlarging pulmonary nodules are identified.  Aortic atherosclerosis.  Emphysematous change.   Electronically Signed   By: Lovey Newcomer M.D.   On: 08/30/2016 15:43 RADIOGRAPHIC STUDIES: I have personally reviewed the radiological images as listed and agreed with the findings in the report. No results found.  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 Stage IV; OCT 12thCT lung- Stable Bil lung nodules [compared to July 2017]  # Currently on OPDIVO; proceed with cycle #3 today.Tolerating well without major sideeffects.   # SOB- remote history of smoking; improved. PFT- acceptable.   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   # follow up in 3 weeks/labs/ opdivo. Plan CT after 4 cycles; will order at next visit. Cbc/cmp/Thyroid profile.   Orders Placed This Encounter  Procedures  . CBC with Differential    Standing Status:   Future    Standing Expiration Date:   09/27/2017  . Comprehensive metabolic panel     Standing Status:   Future    Standing Expiration Date:   09/27/2017      Cammie Sickle, MD 09/27/2016 1:57 PM

## 2016-10-09 ENCOUNTER — Telehealth: Payer: Self-pay | Admitting: Internal Medicine

## 2016-10-09 ENCOUNTER — Other Ambulatory Visit: Payer: Self-pay | Admitting: Internal Medicine

## 2016-10-09 DIAGNOSIS — R05 Cough: Secondary | ICD-10-CM | POA: Insufficient documentation

## 2016-10-09 DIAGNOSIS — C642 Malignant neoplasm of left kidney, except renal pelvis: Secondary | ICD-10-CM

## 2016-10-09 DIAGNOSIS — R059 Cough, unspecified: Secondary | ICD-10-CM

## 2016-10-09 NOTE — Telephone Encounter (Signed)
Pt's wife called saying he has a bad cough that sounds very croupy. Please call to advise.

## 2016-10-09 NOTE — Telephone Encounter (Signed)
Recommend CXR ASAP; please inform pt.

## 2016-10-09 NOTE — Telephone Encounter (Signed)
Multiple attempts to get in touch with patient. RN finally spoke with patient's wife - explained the need the cxr.  She states that she will not be able to have her husband to the cxr dept until tom at 8am due to a family event tonight.  Teach back process performed.

## 2016-10-10 ENCOUNTER — Ambulatory Visit
Admission: RE | Admit: 2016-10-10 | Discharge: 2016-10-10 | Disposition: A | Discharge status: Home / Self Care | Payer: PPO | Source: Ambulatory Visit | Attending: Internal Medicine | Admitting: Internal Medicine

## 2016-10-10 ENCOUNTER — Other Ambulatory Visit: Payer: Self-pay | Admitting: Internal Medicine

## 2016-10-10 ENCOUNTER — Telehealth: Payer: Self-pay | Admitting: Internal Medicine

## 2016-10-10 DIAGNOSIS — C642 Malignant neoplasm of left kidney, except renal pelvis: Secondary | ICD-10-CM

## 2016-10-10 DIAGNOSIS — J439 Emphysema, unspecified: Secondary | ICD-10-CM | POA: Insufficient documentation

## 2016-10-10 DIAGNOSIS — R05 Cough: Secondary | ICD-10-CM | POA: Diagnosis not present

## 2016-10-10 DIAGNOSIS — R911 Solitary pulmonary nodule: Secondary | ICD-10-CM | POA: Insufficient documentation

## 2016-10-10 DIAGNOSIS — R059 Cough, unspecified: Secondary | ICD-10-CM

## 2016-10-10 MED ORDER — AZITHROMYCIN 250 MG PO TABS: ORAL_TABLET | ORAL | 0 refills | Status: DC

## 2016-10-10 NOTE — Telephone Encounter (Signed)
Spoke to family ; cxr- neg; ? Bronchitis- recommend z-pak; electronic/ OTC cough syrup.

## 2016-10-18 ENCOUNTER — Inpatient Hospital Stay: Payer: PPO

## 2016-10-18 ENCOUNTER — Inpatient Hospital Stay (HOSPITAL_BASED_OUTPATIENT_CLINIC_OR_DEPARTMENT_OTHER): Payer: PPO | Admitting: Internal Medicine

## 2016-10-18 VITALS — BP 114/69 | HR 54 | Temp 97.6°F | Resp 18 | Wt 219.4 lb

## 2016-10-18 DIAGNOSIS — I2510 Atherosclerotic heart disease of native coronary artery without angina pectoris: Secondary | ICD-10-CM

## 2016-10-18 DIAGNOSIS — Z87891 Personal history of nicotine dependence: Secondary | ICD-10-CM

## 2016-10-18 DIAGNOSIS — Z87442 Personal history of urinary calculi: Secondary | ICD-10-CM

## 2016-10-18 DIAGNOSIS — C7800 Secondary malignant neoplasm of unspecified lung: Secondary | ICD-10-CM | POA: Diagnosis not present

## 2016-10-18 DIAGNOSIS — C642 Malignant neoplasm of left kidney, except renal pelvis: Secondary | ICD-10-CM

## 2016-10-18 DIAGNOSIS — Z807 Family history of other malignant neoplasms of lymphoid, hematopoietic and related tissues: Secondary | ICD-10-CM

## 2016-10-18 DIAGNOSIS — C4359 Malignant melanoma of other part of trunk: Secondary | ICD-10-CM | POA: Diagnosis not present

## 2016-10-18 DIAGNOSIS — Z5112 Encounter for antineoplastic immunotherapy: Secondary | ICD-10-CM | POA: Diagnosis not present

## 2016-10-18 DIAGNOSIS — Z7982 Long term (current) use of aspirin: Secondary | ICD-10-CM

## 2016-10-18 DIAGNOSIS — R0602 Shortness of breath: Secondary | ICD-10-CM

## 2016-10-18 DIAGNOSIS — N189 Chronic kidney disease, unspecified: Secondary | ICD-10-CM

## 2016-10-18 DIAGNOSIS — Z79899 Other long term (current) drug therapy: Secondary | ICD-10-CM

## 2016-10-18 DIAGNOSIS — I700 Atherosclerosis of aorta: Secondary | ICD-10-CM

## 2016-10-18 DIAGNOSIS — I129 Hypertensive chronic kidney disease with stage 1 through stage 4 chronic kidney disease, or unspecified chronic kidney disease: Secondary | ICD-10-CM

## 2016-10-18 DIAGNOSIS — Z905 Acquired absence of kidney: Secondary | ICD-10-CM

## 2016-10-18 LAB — COMPREHENSIVE METABOLIC PANEL
ALT: 33 U/L (ref 17–63)
AST: 34 U/L (ref 15–41)
Albumin: 4.1 g/dL (ref 3.5–5.0)
Alkaline Phosphatase: 78 U/L (ref 38–126)
Anion gap: 8 (ref 5–15)
BUN: 16 mg/dL (ref 6–20)
CHLORIDE: 102 mmol/L (ref 101–111)
CO2: 27 mmol/L (ref 22–32)
CREATININE: 1.48 mg/dL — AB (ref 0.61–1.24)
Calcium: 8.9 mg/dL (ref 8.9–10.3)
GFR, EST AFRICAN AMERICAN: 52 mL/min — AB (ref >60)
GFR, EST NON AFRICAN AMERICAN: 45 mL/min — AB (ref >60)
Glucose, Bld: 155 mg/dL — ABNORMAL HIGH (ref 65–99)
POTASSIUM: 4 mmol/L (ref 3.5–5.1)
SODIUM: 137 mmol/L (ref 135–145)
Total Bilirubin: 0.5 mg/dL (ref 0.3–1.2)
Total Protein: 7.2 g/dL (ref 6.5–8.1)

## 2016-10-18 LAB — CBC WITH DIFFERENTIAL/PLATELET
BASOS ABS: 0 10*3/uL (ref 0–0.1)
Basophils Relative: 1 %
EOS ABS: 0.2 10*3/uL (ref 0–0.7)
EOS PCT: 3 %
HCT: 39 % — ABNORMAL LOW (ref 40.0–52.0)
Hemoglobin: 13.6 g/dL (ref 13.0–18.0)
LYMPHS ABS: 1.3 10*3/uL (ref 1.0–3.6)
LYMPHS PCT: 18 %
MCH: 30.7 pg (ref 26.0–34.0)
MCHC: 34.8 g/dL (ref 32.0–36.0)
MCV: 88.2 fL (ref 80.0–100.0)
MONO ABS: 0.5 10*3/uL (ref 0.2–1.0)
Monocytes Relative: 7 %
NEUTROS PCT: 71 %
Neutro Abs: 5.1 10*3/uL (ref 1.4–6.5)
PLATELETS: 144 10*3/uL — AB (ref 150–440)
RBC: 4.42 MIL/uL (ref 4.40–5.90)
RDW: 15 % — AB (ref 11.5–14.5)
WBC: 7.2 10*3/uL (ref 3.8–10.6)

## 2016-10-18 MED ORDER — SODIUM CHLORIDE 0.9 % IV SOLN
240.0000 mg | Freq: Once | INTRAVENOUS | Status: AC
  Administered 2016-10-18: 240 mg via INTRAVENOUS
  Filled 2016-10-18: qty 4

## 2016-10-18 MED ORDER — SODIUM CHLORIDE 0.9 % IV SOLN
Freq: Once | INTRAVENOUS | Status: AC
  Administered 2016-10-18: 12:00:00 via INTRAVENOUS
  Filled 2016-10-18: qty 1000

## 2016-10-18 NOTE — Assessment & Plan Note (Signed)
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 Stage IV; OCT 12thCT lung- Stable Bil lung nodules [compared to July 2017]  # Currently on OPDIVO; proceed with cycle #4 today.Tolerating well without major sideeffects.   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   # follow up in 2 weeks/labs/ opdivo/ CT scan prior.

## 2016-10-18 NOTE — Progress Notes (Signed)
Patient is here for follow up he is doing well no complaints  

## 2016-10-18 NOTE — Progress Notes (Signed)
[place] OFFICE PROGRESS NOTE  Patient Care Team: Dion Body, MD as PCP - General (Family Medicine) Jannet Mantis, MD (Dermatology) Robert Bellow, MD (General Surgery) Hollice Espy, MD as Consulting Physician (Urology)  No matching staging information was found for the patient.   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  male pleasant patient above history of Newly diagnosed renal cell cancer status post left nephrectomy- With metastases to the lung- Is currently started on Opdivo status post cycle 3 cycles.   Patient had episode of cough/shortness of breath for which he was treated with antibiotics. Chest x-ray was negative for pneumonia. Cough improved. He feels better.   He is fairly active. No chest pain. No pain anywhere else. Appetite is good. No weight loss. Weight gain.   REVIEW OF SYSTEMS:  A complete 10 point review of system is done which is negative except mentioned above/history of present illness.   PAST MEDICAL HISTORY :  Past Medical History:  Diagnosis Date  . CAD (coronary artery disease)   . Cancer ([place])    left arm  . Hypertension   . Kidney stone     PAST SURGICAL  HISTORY :   Past Surgical History:  Procedure Laterality Date  . arm surgery Left    arm  . cardiac stents  2011   Angioplasty / Stenting Femoral-X2  . COLONOSCOPY  04/01/12  . CORONARY ANGIOPLASTY    . EXCISION MELANOMA WITH SENTINEL LYMPH NODE BIOPSY Right 01/24/2016   Procedure: EXCISION MELANOMA Right Shoulder;  Surgeon: Robert Bellow, MD;  Location: [facility] ORS;  Service: General;  Laterality: Right;  . LAPAROSCOPIC NEPHRECTOMY, HAND ASSISTED Left 04/24/2016   Procedure: HAND ASSISTED LAPAROSCOPIC NEPHRECTOMY;  Surgeon: Hollice Espy, MD;  Location: [facility] ORS;  Service: Urology;  Laterality: Left;  Marland Kitchen VIDEO ASSISTED THORACOSCOPY (VATS)/THOROCOTOMY Left 03/08/2016   Procedure: VIDEO ASSISTED THORACOSCOPY (VATS) with lung biopsy - Left ;  Surgeon: Robert Bellow, MD;  Location: [facility] ORS;  Service: General;  Laterality: Left;    FAMILY HISTORY :   Family History  Problem Relation Age of Onset  . Hodgkin's lymphoma Daughter 5  . Kidney cancer Neg Hx   . Kidney disease Neg Hx   . Prostate cancer Neg Hx     SOCIAL HISTORY:   Social History  Substance Use Topics  . Smoking status: Former Smoker    Types: Cigarettes    Start date: 01/18/1956    Quit date: 01/17/1969  . Smokeless tobacco: Never Used  . Alcohol use No     Comment: BEER OCC    ALLERGIES:  is allergic to lisinopril and other.  MEDICATIONS:  Current  Outpatient Prescriptions  Medication Sig Dispense Refill  . aspirin EC 81 MG tablet Take 81 mg by mouth daily.    Marland Kitchen atorvastatin (LIPITOR) 40 MG tablet Take 40 mg by mouth daily.    Marland Kitchen azithromycin (ZITHROMAX) 250 MG tablet Take 2 on day 1; and then 1 pill once a day. 6 each 0  . clopidogrel (PLAVIX) 75 MG tablet Take 75 mg by mouth daily.    Marland Kitchen docusate sodium (COLACE) 100 MG capsule TAKE ONE CAPSULE BY MOUTH TWICE DAILY 60 capsule 3  . losartan (COZAAR) 50 MG tablet Take 50 mg by mouth every morning.     Marland Kitchen MELATONIN PO Take 1 capsule by mouth at bedtime.     .  metoprolol succinate (TOPROL-XL) 50 MG 24 hr tablet Take 50 mg by mouth every morning.     . nitroGLYCERIN (NITROSTAT) 0.4 MG SL tablet Place under the tongue.    . sertraline (ZOLOFT) 100 MG tablet Take by mouth. Takes one and one half tablets daily    . tamsulosin (FLOMAX) 0.4 MG CAPS capsule Take 0.4 mg by mouth every morning.     . magic mouthwash w/lidocaine SOLN Take 5 mLs by mouth 4 (four) times daily. (Patient not taking: Reported on 10/18/2016) 480 mL 3   No current facility-administered medications for this visit.     PHYSICAL EXAMINATION: ECOG PERFORMANCE STATUS: 0 - Asymptomatic  BP 114/69 (BP Location: Left Arm, Patient Position: Sitting)   Pulse (!) 54   Temp 97.6 F (36.4 C) (Tympanic)   Resp 18   Wt 219 lb 6.4 oz (99.5 kg)   BMI 28.95 kg/m   Filed Weights   10/18/16 0958  Weight: 219 lb 6.4 oz (99.5 kg)    GENERAL: Well-nourished well-developed; Alert, no distress and comfortable. Accompanied by wifeAnd daughter. EYES: no pallor or icterus OROPHARYNX: no thrush or ulceration; NECK: supple, no masses felt LYMPH:  no palpable lymphadenopathy in the cervical, axillary or inguinal regions LUNGS: clear to auscultation and  No wheeze or crackles HEART/CVS: regular rate & rhythm and no murmurs; No lower extremity edema ABDOMEN:abdomen soft, non-tender and normal bowel sounds Musculoskeletal:no cyanosis of digits and no clubbing  PSYCH: alert & oriented x 3 with fluent speech NEURO: no focal motor/sensory deficits SKIN:  no rashes or significant lesions; nephrectomy incisions healing well.  LABORATORY DATA:  I have reviewed the data as listed    Component Value Date/Time   NA 137 10/18/2016 0920   NA 146 (H) 05/25/2016 1514   K 4.0 10/18/2016 0920   CL 102 10/18/2016 0920   CO2 27 10/18/2016 0920   GLUCOSE 155 (H) 10/18/2016 0920   BUN 16 10/18/2016 0920   BUN 19 05/25/2016 1514   CREATININE 1.48 (H) 10/18/2016 0920   CALCIUM 8.9 10/18/2016 0920   PROT 7.2  10/18/2016 0920   ALBUMIN 4.1 10/18/2016 0920   AST 34 10/18/2016 0920   ALT 33 10/18/2016 0920   ALKPHOS 78 10/18/2016 0920   BILITOT 0.5 10/18/2016 0920   GFRNONAA 45 (L) 10/18/2016 0920   GFRAA 52 (L) 10/18/2016 0920    No results found for: SPEP, UPEP  Lab Results  Component Value Date   WBC 7.2 10/18/2016   NEUTROABS 5.1 10/18/2016   HGB 13.6 10/18/2016   HCT 39.0 (L) 10/18/2016   MCV 88.2 10/18/2016   PLT 144 (L) 10/18/2016      Chemistry      Component Value Date/Time   NA 137 10/18/2016 0920  NA 146 (H) 05/25/2016 1514   K 4.0 10/18/2016 0920   CL 102 10/18/2016 0920   CO2 27 10/18/2016 0920   BUN 16 10/18/2016 0920   BUN 19 05/25/2016 1514   CREATININE 1.48 (H) 10/18/2016 0920      Component Value Date/Time   CALCIUM 8.9 10/18/2016 0920   ALKPHOS 78 10/18/2016 0920   AST 34 10/18/2016 0920   ALT 33 10/18/2016 0920   BILITOT 0.5 10/18/2016 0920     IMPRESSION: Grossly unchanged bilateral pulmonary nodules compatible with metastasis. No new or enlarging pulmonary nodules are identified.  Aortic atherosclerosis.  Emphysematous change.   Electronically Signed   By: Lovey Newcomer M.D.   On: 08/30/2016 15:43 RADIOGRAPHIC STUDIES: I have personally reviewed the radiological images as listed and agreed with the findings in the report. No results found.  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 Stage IV; OCT 12thCT lung- Stable Bil lung nodules [compared to July 2017]  # Currently on OPDIVO; proceed with cycle #4 today.Tolerating well without major sideeffects.   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   # follow up in 2 weeks/labs/ opdivo/ CT scan prior.   Orders Placed This Encounter  Procedures  . CT CHEST WO CONTRAST    Standing Status:   Future    Standing Expiration Date:   12/18/2017    Order Specific  Question:   Reason for Exam (SYMPTOM  OR DIAGNOSIS REQUIRED)    Answer:   kidney cancer    Order Specific Question:   Preferred imaging location?    Answer:   [facility]  . CBC with Differential    Standing Status:   Standing    Number of Occurrences:   10    Standing Expiration Date:   10/18/2017  . Comprehensive metabolic panel    Standing Status:   Standing    Number of Occurrences:   10    Standing Expiration Date:   10/18/2017      Cammie Sickle, MD 10/18/2016 6:15 PM

## 2016-10-24 ENCOUNTER — Telehealth: Payer: Self-pay | Admitting: *Deleted

## 2016-10-24 ENCOUNTER — Ambulatory Visit
Admission: RE | Admit: 2016-10-24 | Discharge: 2016-10-24 | Disposition: A | Discharge status: Home / Self Care | Payer: PPO | Source: Ambulatory Visit | Attending: Internal Medicine | Admitting: Internal Medicine

## 2016-10-24 DIAGNOSIS — C642 Malignant neoplasm of left kidney, except renal pelvis: Secondary | ICD-10-CM | POA: Insufficient documentation

## 2016-10-24 DIAGNOSIS — R05 Cough: Secondary | ICD-10-CM

## 2016-10-24 DIAGNOSIS — R059 Cough, unspecified: Secondary | ICD-10-CM

## 2016-10-24 MED ORDER — HYDROCOD POLST-CPM POLST ER 10-8 MG/5ML PO SUER: 5.0000 mL | Freq: Two times a day (BID) | ORAL | 0 refills | Status: DC | PRN

## 2016-10-24 MED ORDER — LEVOFLOXACIN 500 MG PO TABS: 500.0000 mg | ORAL_TABLET | Freq: Every day | ORAL | 0 refills | Status: DC

## 2016-10-24 NOTE — Telephone Encounter (Signed)
States he has been coughing ever since his last infusion and that he has tried numerous OTC cough meds with no resolution. He is unable to sleep due to coughing and she is asking what can be done.Please advise

## 2016-10-24 NOTE — Telephone Encounter (Signed)
-----   Message from Cammie Sickle, MD sent at 10/24/2016  4:48 PM EST ----- Please inform pt that CXR- is negative for pneumonia. Please call in levaquin 500 mg /day x 7 days. Thx

## 2016-10-24 NOTE — Telephone Encounter (Signed)
Per Dr Rogue Bussing, CXR and Tussionex every 12 hours prn. Advised [name] of need for CXR and he will go to [place] to get CXR then come here to pick up prescription

## 2016-10-24 NOTE — Telephone Encounter (Signed)
Spoke with [name] regarding results of CXR being negative for pneumonia and that prescription being sent to pharmacy She confirmed [facility] in [place]

## 2016-10-30 ENCOUNTER — Ambulatory Visit
Admission: RE | Admit: 2016-10-30 | Discharge: 2016-10-30 | Disposition: A | Discharge status: Home / Self Care | Payer: PPO | Source: Ambulatory Visit | Attending: Internal Medicine | Admitting: Internal Medicine

## 2016-10-30 DIAGNOSIS — R918 Other nonspecific abnormal finding of lung field: Secondary | ICD-10-CM | POA: Diagnosis not present

## 2016-10-30 DIAGNOSIS — I2510 Atherosclerotic heart disease of native coronary artery without angina pectoris: Secondary | ICD-10-CM | POA: Diagnosis not present

## 2016-10-30 DIAGNOSIS — C642 Malignant neoplasm of left kidney, except renal pelvis: Secondary | ICD-10-CM | POA: Diagnosis not present

## 2016-10-30 DIAGNOSIS — Z905 Acquired absence of kidney: Secondary | ICD-10-CM | POA: Insufficient documentation

## 2016-10-30 DIAGNOSIS — I700 Atherosclerosis of aorta: Secondary | ICD-10-CM | POA: Insufficient documentation

## 2016-10-30 DIAGNOSIS — J432 Centrilobular emphysema: Secondary | ICD-10-CM | POA: Diagnosis not present

## 2016-10-30 DIAGNOSIS — R161 Splenomegaly, not elsewhere classified: Secondary | ICD-10-CM | POA: Insufficient documentation

## 2016-11-01 ENCOUNTER — Inpatient Hospital Stay (HOSPITAL_BASED_OUTPATIENT_CLINIC_OR_DEPARTMENT_OTHER): Payer: PPO | Admitting: Internal Medicine

## 2016-11-01 ENCOUNTER — Inpatient Hospital Stay: Payer: PPO | Attending: Internal Medicine

## 2016-11-01 ENCOUNTER — Inpatient Hospital Stay: Payer: PPO

## 2016-11-01 VITALS — BP 104/67 | HR 56 | Temp 96.6°F | Wt 222.0 lb

## 2016-11-01 DIAGNOSIS — Z5112 Encounter for antineoplastic immunotherapy: Secondary | ICD-10-CM | POA: Diagnosis not present

## 2016-11-01 DIAGNOSIS — C7801 Secondary malignant neoplasm of right lung: Secondary | ICD-10-CM | POA: Insufficient documentation

## 2016-11-01 DIAGNOSIS — Z87891 Personal history of nicotine dependence: Secondary | ICD-10-CM | POA: Diagnosis not present

## 2016-11-01 DIAGNOSIS — R0602 Shortness of breath: Secondary | ICD-10-CM

## 2016-11-01 DIAGNOSIS — Z7982 Long term (current) use of aspirin: Secondary | ICD-10-CM | POA: Diagnosis not present

## 2016-11-01 DIAGNOSIS — Z905 Acquired absence of kidney: Secondary | ICD-10-CM

## 2016-11-01 DIAGNOSIS — C7802 Secondary malignant neoplasm of left lung: Secondary | ICD-10-CM | POA: Insufficient documentation

## 2016-11-01 DIAGNOSIS — I2510 Atherosclerotic heart disease of native coronary artery without angina pectoris: Secondary | ICD-10-CM

## 2016-11-01 DIAGNOSIS — I129 Hypertensive chronic kidney disease with stage 1 through stage 4 chronic kidney disease, or unspecified chronic kidney disease: Secondary | ICD-10-CM | POA: Insufficient documentation

## 2016-11-01 DIAGNOSIS — Z807 Family history of other malignant neoplasms of lymphoid, hematopoietic and related tissues: Secondary | ICD-10-CM | POA: Diagnosis not present

## 2016-11-01 DIAGNOSIS — C642 Malignant neoplasm of left kidney, except renal pelvis: Secondary | ICD-10-CM

## 2016-11-01 DIAGNOSIS — Z87442 Personal history of urinary calculi: Secondary | ICD-10-CM

## 2016-11-01 DIAGNOSIS — N189 Chronic kidney disease, unspecified: Secondary | ICD-10-CM

## 2016-11-01 DIAGNOSIS — C496 Malignant neoplasm of connective and soft tissue of trunk, unspecified: Secondary | ICD-10-CM

## 2016-11-01 DIAGNOSIS — Z79899 Other long term (current) drug therapy: Secondary | ICD-10-CM | POA: Diagnosis not present

## 2016-11-01 LAB — COMPREHENSIVE METABOLIC PANEL
ALT: 23 U/L (ref 17–63)
ANION GAP: 5 (ref 5–15)
AST: 29 U/L (ref 15–41)
Albumin: 4.1 g/dL (ref 3.5–5.0)
Alkaline Phosphatase: 77 U/L (ref 38–126)
BUN: 17 mg/dL (ref 6–20)
CHLORIDE: 101 mmol/L (ref 101–111)
CO2: 29 mmol/L (ref 22–32)
CREATININE: 1.64 mg/dL — AB (ref 0.61–1.24)
Calcium: 8.8 mg/dL — ABNORMAL LOW (ref 8.9–10.3)
GFR, EST AFRICAN AMERICAN: 46 mL/min — AB (ref >60)
GFR, EST NON AFRICAN AMERICAN: 39 mL/min — AB (ref >60)
Glucose, Bld: 94 mg/dL (ref 65–99)
POTASSIUM: 3.9 mmol/L (ref 3.5–5.1)
SODIUM: 135 mmol/L (ref 135–145)
Total Bilirubin: 0.7 mg/dL (ref 0.3–1.2)
Total Protein: 7 g/dL (ref 6.5–8.1)

## 2016-11-01 LAB — CBC WITH DIFFERENTIAL/PLATELET
Basophils Absolute: 0 10*3/uL (ref 0–0.1)
Basophils Relative: 1 %
EOS ABS: 0.3 10*3/uL (ref 0–0.7)
EOS PCT: 5 %
HCT: 36.8 % — ABNORMAL LOW (ref 40.0–52.0)
Hemoglobin: 12.6 g/dL — ABNORMAL LOW (ref 13.0–18.0)
LYMPHS ABS: 1 10*3/uL (ref 1.0–3.6)
LYMPHS PCT: 17 %
MCH: 29.9 pg (ref 26.0–34.0)
MCHC: 34.4 g/dL (ref 32.0–36.0)
MCV: 86.9 fL (ref 80.0–100.0)
MONO ABS: 0.8 10*3/uL (ref 0.2–1.0)
Monocytes Relative: 13 %
Neutro Abs: 3.8 10*3/uL (ref 1.4–6.5)
Neutrophils Relative %: 64 %
PLATELETS: 134 10*3/uL — AB (ref 150–440)
RBC: 4.23 MIL/uL — ABNORMAL LOW (ref 4.40–5.90)
RDW: 14.6 % — AB (ref 11.5–14.5)
WBC: 5.9 10*3/uL (ref 3.8–10.6)

## 2016-11-01 MED ORDER — SODIUM CHLORIDE 0.9 % IV SOLN
Freq: Once | INTRAVENOUS | Status: AC
  Administered 2016-11-01: 12:00:00 via INTRAVENOUS
  Filled 2016-11-01: qty 1000

## 2016-11-01 MED ORDER — SODIUM CHLORIDE 0.9 % IV SOLN
240.0000 mg | Freq: Once | INTRAVENOUS | Status: AC
  Administered 2016-11-01: 240 mg via INTRAVENOUS
  Filled 2016-11-01: qty 20

## 2016-11-01 NOTE — Progress Notes (Signed)
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

## 2016-11-01 NOTE — Assessment & Plan Note (Addendum)
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 Stage IV; DEC 8th CT lung- Progression Bil lung nodules [compared to Oct 2017]; discussed the rationale for continuing the opdivo [in a minority response noted post initial progression; poor tolerance to other therapies etc.]  # Currently on OPDIVO; proceed with cycle #5 today.Tolerating well without major side effects- Except for slightly abnormal TSH. We'll repeat TSH today.   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   # follow up in 2 weeks/labs/ opdivo- X-MD; will repeat TSH today. Follow up with me in 4 weeks/labs/opdivo.   # I reviewed the blood work- with the patient in detail; also reviewed the imaging independently [as summarized above]; and with the patient in detail.

## 2016-11-01 NOTE — Progress Notes (Signed)
Creatinine 1.64, Per Dr. Rogue Bussing ok to give Opdivo.

## 2016-11-01 NOTE — Progress Notes (Signed)
[place] OFFICE PROGRESS NOTE  Patient Care Team: Dion Body, MD as PCP - General (Family Medicine) Jannet Mantis, MD (Dermatology) Robert Bellow, MD (General Surgery) Hollice Espy, MD as Consulting Physician (Urology)  No matching staging information was found for the patient.   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  male pleasant patient above history of Newly diagnosed renal cell cancer status post left nephrectomy- With metastases to the lung- Is currently started on Opdivo status post cycle 4 cycles- Is here to review the results of his restaging CAT scan.  Patient's cough has resolved. Mild chronic shortness of breath not any worse. No chronic diarrhea.  He is fairly active. No chest pain. No pain anywhere else. Appetite is good. No weight loss. Weight gain.   REVIEW OF SYSTEMS:  A complete 10 point review of system is done which is negative except mentioned above/history of present illness.   PAST MEDICAL HISTORY :  Past Medical History:  Diagnosis Date  . CAD (coronary artery disease)   . Cancer ([place])    left arm  . Hypertension   . Kidney stone     PAST SURGICAL HISTORY :   Past Surgical History:  Procedure Laterality  Date  . arm surgery Left    arm  . cardiac stents  2011   Angioplasty / Stenting Femoral-X2  . COLONOSCOPY  04/01/12  . CORONARY ANGIOPLASTY    . EXCISION MELANOMA WITH SENTINEL LYMPH NODE BIOPSY Right 01/24/2016   Procedure: EXCISION MELANOMA Right Shoulder;  Surgeon: Robert Bellow, MD;  Location: [facility] ORS;  Service: General;  Laterality: Right;  . LAPAROSCOPIC NEPHRECTOMY, HAND ASSISTED Left 04/24/2016   Procedure: HAND ASSISTED LAPAROSCOPIC NEPHRECTOMY;  Surgeon: Hollice Espy, MD;  Location: [facility] ORS;  Service: Urology;  Laterality: Left;  Marland Kitchen VIDEO ASSISTED THORACOSCOPY (VATS)/THOROCOTOMY Left 03/08/2016   Procedure: VIDEO ASSISTED THORACOSCOPY (VATS) with lung biopsy - Left ;  Surgeon: Robert Bellow, MD;  Location: [facility] ORS;  Service: General;  Laterality: Left;    FAMILY HISTORY :   Family History  Problem Relation Age of Onset  . Hodgkin's lymphoma Daughter 5  . Kidney cancer Neg Hx   . Kidney disease Neg Hx   . Prostate cancer Neg Hx     SOCIAL HISTORY:   Social History  Substance Use Topics  . Smoking status: Former Smoker    Types: Cigarettes    Start date: 01/18/1956    Quit date: 01/17/1969  . Smokeless tobacco: Never Used  . Alcohol use No     Comment: BEER OCC    ALLERGIES:  is allergic to lisinopril and other.  MEDICATIONS:  Current Outpatient Prescriptions  Medication Sig Dispense Refill  . aspirin EC 81 MG  tablet Take 81 mg by mouth daily.    Marland Kitchen atorvastatin (LIPITOR) 40 MG tablet Take 40 mg by mouth daily.    . clopidogrel (PLAVIX) 75 MG tablet Take 75 mg by mouth daily.    Marland Kitchen docusate sodium (COLACE) 100 MG capsule TAKE ONE CAPSULE BY MOUTH TWICE DAILY 60 capsule 3  . losartan (COZAAR) 50 MG tablet Take 50 mg by mouth every morning.     . magic mouthwash w/lidocaine SOLN Take 5 mLs by mouth 4 (four) times daily. 480 mL 3  . MELATONIN PO Take 5 mg by mouth at bedtime.     . metoprolol succinate (TOPROL-XL) 50 MG 24 hr tablet Take 50 mg by mouth every  morning.     . nitroGLYCERIN (NITROSTAT) 0.4 MG SL tablet Place 0.4 mg under the tongue every 5 (five) minutes as needed.     . sertraline (ZOLOFT) 100 MG tablet Take by mouth. Takes one and one half tablets daily    . tamsulosin (FLOMAX) 0.4 MG CAPS capsule Take 0.4 mg by mouth every morning.      No current facility-administered medications for this visit.     PHYSICAL EXAMINATION: ECOG PERFORMANCE STATUS: 0 - Asymptomatic  BP 104/67 (BP Location: Left Arm, Patient Position: Sitting)   Pulse (!) 56   Temp (!) 96.6 F (35.9 C) (Tympanic)   Wt 222 lb (100.7 kg)   BMI 29.29 kg/m   Filed Weights   11/01/16 1013  Weight: 222 lb (100.7 kg)    GENERAL: Well-nourished well-developed; Alert, no distress and comfortable. Accompanied by wifeAnd daughter. EYES: no pallor or icterus OROPHARYNX: no thrush or ulceration; NECK: supple, no masses felt LYMPH:  no palpable lymphadenopathy in the cervical, axillary or inguinal regions LUNGS: clear to auscultation and  No wheeze or crackles HEART/CVS: regular rate & rhythm and no murmurs; No lower extremity edema ABDOMEN:abdomen soft, non-tender and normal bowel sounds Musculoskeletal:no cyanosis of digits and no clubbing  PSYCH: alert & oriented x 3 with fluent speech NEURO: no focal motor/sensory deficits SKIN:  no rashes or significant lesions; nephrectomy incisions healing well.  LABORATORY DATA:  I have reviewed the data as listed    Component Value Date/Time   NA 135 11/01/2016 0920   NA 146 (H) 05/25/2016 1514   K 3.9 11/01/2016 0920   CL 101 11/01/2016 0920   CO2 29 11/01/2016 0920   GLUCOSE 94 11/01/2016 0920   BUN 17 11/01/2016 0920   BUN 19 05/25/2016 1514   CREATININE 1.64 (H) 11/01/2016 0920   CALCIUM 8.8 (L) 11/01/2016 0920   PROT 7.0 11/01/2016 0920   ALBUMIN 4.1 11/01/2016 0920   AST 29 11/01/2016 0920   ALT 23 11/01/2016 0920   ALKPHOS 77 11/01/2016 0920   BILITOT 0.7 11/01/2016 0920   GFRNONAA 39 (L) 11/01/2016  0920   GFRAA 46 (L) 11/01/2016 0920    No results found for: SPEP, UPEP  Lab Results  Component Value Date   WBC 5.9 11/01/2016   NEUTROABS 3.8 11/01/2016   HGB 12.6 (L) 11/01/2016   HCT 36.8 (L) 11/01/2016   MCV 86.9 11/01/2016   PLT 134 (L) 11/01/2016      Chemistry      Component Value Date/Time   NA 135 11/01/2016 0920   NA 146 (H) 05/25/2016 1514   K 3.9 11/01/2016 0920   CL 101 11/01/2016 0920   CO2 29 11/01/2016 0920   BUN 17 11/01/2016 0920   BUN 19 05/25/2016 1514  CREATININE 1.64 (H) 11/01/2016 0920      Component Value Date/Time   CALCIUM 8.8 (L) 11/01/2016 0920   ALKPHOS 77 11/01/2016 0920   AST 29 11/01/2016 0920   ALT 23 11/01/2016 0920   BILITOT 0.7 11/01/2016 0920     RADIOGRAPHIC STUDIES: I have personally reviewed the radiological images as listed and agreed with the findings in the report. No results found.  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 Stage IV; DEC 8th CT lung- Progression Bil lung nodules [compared to Oct 2017]; discussed the rationale for continuing the opdivo [in a minority response noted post initial progression; poor tolerance to other therapies etc.]  # Currently on OPDIVO; proceed with cycle #5 today.Tolerating well without major side effects- Except for slightly abnormal TSH. We'll repeat TSH today.   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   # follow up in 2 weeks/labs/ opdivo- X-MD; will repeat TSH today. Follow up with me in 4 weeks/labs/opdivo.   # I reviewed the blood work- with the patient in detail; also reviewed the imaging independently [as summarized above]; and with the patient in detail.    No orders of the defined types were placed in this encounter.     Cammie Sickle, MD 11/02/2016 8:13 AM

## 2016-11-02 ENCOUNTER — Other Ambulatory Visit: Payer: PPO

## 2016-11-02 ENCOUNTER — Ambulatory Visit: Payer: PPO | Admitting: Internal Medicine

## 2016-11-15 ENCOUNTER — Inpatient Hospital Stay: Payer: PPO

## 2016-11-15 ENCOUNTER — Other Ambulatory Visit: Payer: Self-pay

## 2016-11-15 ENCOUNTER — Inpatient Hospital Stay (HOSPITAL_BASED_OUTPATIENT_CLINIC_OR_DEPARTMENT_OTHER): Payer: PPO | Admitting: Oncology

## 2016-11-15 VITALS — BP 139/83 | HR 57 | Temp 97.8°F | Resp 20 | Ht 73.0 in | Wt 221.3 lb

## 2016-11-15 DIAGNOSIS — Z5112 Encounter for antineoplastic immunotherapy: Secondary | ICD-10-CM | POA: Diagnosis not present

## 2016-11-15 DIAGNOSIS — Z87891 Personal history of nicotine dependence: Secondary | ICD-10-CM

## 2016-11-15 DIAGNOSIS — C642 Malignant neoplasm of left kidney, except renal pelvis: Secondary | ICD-10-CM

## 2016-11-15 DIAGNOSIS — R0602 Shortness of breath: Secondary | ICD-10-CM

## 2016-11-15 DIAGNOSIS — C7802 Secondary malignant neoplasm of left lung: Secondary | ICD-10-CM | POA: Diagnosis not present

## 2016-11-15 DIAGNOSIS — N189 Chronic kidney disease, unspecified: Secondary | ICD-10-CM

## 2016-11-15 DIAGNOSIS — C7801 Secondary malignant neoplasm of right lung: Secondary | ICD-10-CM

## 2016-11-15 DIAGNOSIS — C496 Malignant neoplasm of connective and soft tissue of trunk, unspecified: Secondary | ICD-10-CM

## 2016-11-15 DIAGNOSIS — I129 Hypertensive chronic kidney disease with stage 1 through stage 4 chronic kidney disease, or unspecified chronic kidney disease: Secondary | ICD-10-CM

## 2016-11-15 DIAGNOSIS — I2510 Atherosclerotic heart disease of native coronary artery without angina pectoris: Secondary | ICD-10-CM

## 2016-11-15 DIAGNOSIS — Z7982 Long term (current) use of aspirin: Secondary | ICD-10-CM

## 2016-11-15 DIAGNOSIS — Z87442 Personal history of urinary calculi: Secondary | ICD-10-CM

## 2016-11-15 DIAGNOSIS — Z79899 Other long term (current) drug therapy: Secondary | ICD-10-CM

## 2016-11-15 DIAGNOSIS — Z807 Family history of other malignant neoplasms of lymphoid, hematopoietic and related tissues: Secondary | ICD-10-CM

## 2016-11-15 DIAGNOSIS — Z905 Acquired absence of kidney: Secondary | ICD-10-CM

## 2016-11-15 LAB — CBC WITH DIFFERENTIAL/PLATELET
BASOS ABS: 0 10*3/uL (ref 0–0.1)
BASOS PCT: 1 %
EOS ABS: 0.3 10*3/uL (ref 0–0.7)
Eosinophils Relative: 4 %
HEMATOCRIT: 37.9 % — AB (ref 40.0–52.0)
Hemoglobin: 13.2 g/dL (ref 13.0–18.0)
Lymphocytes Relative: 17 %
Lymphs Abs: 1.3 10*3/uL (ref 1.0–3.6)
MCH: 29.1 pg (ref 26.0–34.0)
MCHC: 34.9 g/dL (ref 32.0–36.0)
MCV: 83.3 fL (ref 80.0–100.0)
MONO ABS: 0.7 10*3/uL (ref 0.2–1.0)
Monocytes Relative: 10 %
NEUTROS ABS: 5 10*3/uL (ref 1.4–6.5)
Neutrophils Relative %: 68 %
PLATELETS: 169 10*3/uL (ref 150–440)
RBC: 4.55 MIL/uL (ref 4.40–5.90)
RDW: 14.9 % — AB (ref 11.5–14.5)
WBC: 7.3 10*3/uL (ref 3.8–10.6)

## 2016-11-15 LAB — COMPREHENSIVE METABOLIC PANEL
ALBUMIN: 4 g/dL (ref 3.5–5.0)
ALT: 30 U/L (ref 17–63)
AST: 35 U/L (ref 15–41)
Alkaline Phosphatase: 80 U/L (ref 38–126)
Anion gap: 3 — ABNORMAL LOW (ref 5–15)
BILIRUBIN TOTAL: 0.4 mg/dL (ref 0.3–1.2)
BUN: 19 mg/dL (ref 6–20)
CHLORIDE: 103 mmol/L (ref 101–111)
CO2: 29 mmol/L (ref 22–32)
Calcium: 9 mg/dL (ref 8.9–10.3)
Creatinine, Ser: 1.53 mg/dL — ABNORMAL HIGH (ref 0.61–1.24)
GFR calc Af Amer: 50 mL/min — ABNORMAL LOW (ref >60)
GFR calc non Af Amer: 43 mL/min — ABNORMAL LOW (ref >60)
Glucose, Bld: 129 mg/dL — ABNORMAL HIGH (ref 65–99)
POTASSIUM: 3.8 mmol/L (ref 3.5–5.1)
Sodium: 135 mmol/L (ref 135–145)
Total Protein: 6.9 g/dL (ref 6.5–8.1)

## 2016-11-15 MED ORDER — SODIUM CHLORIDE 0.9 % IV SOLN
Freq: Once | INTRAVENOUS | Status: AC
  Administered 2016-11-15: 11:00:00 via INTRAVENOUS
  Filled 2016-11-15: qty 1000

## 2016-11-15 MED ORDER — SODIUM CHLORIDE 0.9 % IV SOLN
240.0000 mg | Freq: Once | INTRAVENOUS | Status: AC
  Administered 2016-11-15: 240 mg via INTRAVENOUS
  Filled 2016-11-15: qty 20

## 2016-11-15 NOTE — Progress Notes (Signed)
Patient here for next cycle of his opdivo. C/o generalized fatigue and loss of taste. He reports no other concerns.

## 2016-11-15 NOTE — Progress Notes (Signed)
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  Chief complaint/ Reason for visit- on treatment assessment prior to opdivo for stage IV kidney cancer  Heme/Onc history:  # MAY- June 2017- METASTATIC CLEAR CELL; LEFT RENAL CA/STAGE IV; Furhman- G-3;  [bil Pul lung nodules;incidental s/p Bx Dr.Byrnett/Dr.Oaks] s/p Cytoreductive nephrectomy; Dr.Brandon- [id number]; July 11th 2017 CT- Enlarging Lung nodules  # Aug 7th 2017- PAZOPANIB; STOP SEP 2017 [pancreatitis/poor tol]  # OCT 1 week- START NIVO q 2W x4; DEC 8th CT- "Progression"; Continue Opdivo  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  ECOG PS- 0 Pain scale- no Opioid associated constipation- no  Review of systems- Review of Systems  Constitutional: Negative for chills, fever, malaise/fatigue and weight loss.  HENT: Negative for congestion, ear discharge and nosebleeds.   Eyes: Negative for blurred vision.  Respiratory: Negative for cough, hemoptysis, sputum production, shortness of breath and wheezing.   Cardiovascular: Negative for chest pain, palpitations, orthopnea and claudication.  Gastrointestinal: Negative for abdominal pain, blood in stool, constipation, diarrhea, heartburn, melena, nausea and vomiting.  Genitourinary: Negative for dysuria, flank pain, frequency, hematuria and  urgency.  Musculoskeletal: Negative for back pain, joint pain and myalgias.  Skin: Negative for rash.  Neurological: Negative for dizziness, tingling, focal weakness, seizures, weakness and headaches.  Endo/Heme/Allergies: Does not bruise/bleed easily.  Psychiatric/Behavioral: Negative for depression and suicidal ideas. The patient does not have insomnia.      Current treatment- opdivo  Allergies  Allergen Reactions  . Lisinopril Swelling  . Other Itching    Nitroglycerin Patch.     Past Medical History:  Diagnosis Date  . CAD (coronary artery disease)   . Cancer ([place])    left arm  . Hypertension   . Kidney stone      Past Surgical History:  Procedure Laterality Date  . arm surgery Left    arm  . cardiac stents  2011   Angioplasty / Stenting Femoral-X2  . COLONOSCOPY  04/01/12  . CORONARY ANGIOPLASTY    . EXCISION MELANOMA WITH SENTINEL LYMPH NODE BIOPSY Right 01/24/2016   Procedure: EXCISION MELANOMA Right Shoulder;  Surgeon: Robert Bellow, MD;  Location: [facility] ORS;  Service: General;  Laterality: Right;  . LAPAROSCOPIC NEPHRECTOMY, HAND ASSISTED Left 04/24/2016   Procedure: HAND ASSISTED LAPAROSCOPIC NEPHRECTOMY;  Surgeon: Hollice Espy, MD;  Location: [facility] ORS;  Service: Urology;  Laterality: Left;  Marland Kitchen VIDEO ASSISTED THORACOSCOPY (VATS)/THOROCOTOMY Left 03/08/2016   Procedure: VIDEO ASSISTED THORACOSCOPY (VATS) with lung biopsy - Left ;  Surgeon: Robert Bellow, MD;  Location: [facility] ORS;  Service: General;  Laterality: Left;    Social History   Social History  . Marital status: Married    Spouse name: N/A  . Number of children: N/A  . Years of education: N/A   Occupational History  . Not on file.   Social History Main Topics  . Smoking  status: Former Smoker    Types: Cigarettes    Start date: 01/18/1956    Quit date: 01/17/1969  . Smokeless tobacco: Never Used  . Alcohol use No     Comment: BEER OCC  . Drug use: No  . Sexual activity: Not on file    Other Topics Concern  . Not on file   Social History Narrative  . No narrative on file    Family History  Problem Relation Age of Onset  . Hodgkin's lymphoma Daughter 5  . Kidney cancer Neg Hx   . Kidney disease Neg Hx   . Prostate cancer Neg Hx      Current Outpatient Prescriptions:  .  aspirin EC 81 MG tablet, Take 81 mg by mouth daily., Disp: , Rfl:  .  atorvastatin (LIPITOR) 40 MG tablet, Take 40 mg by mouth daily., Disp: , Rfl:  .  clopidogrel (PLAVIX) 75 MG tablet, Take 75 mg by mouth daily., Disp: , Rfl:  .  docusate sodium (COLACE) 100 MG capsule, TAKE ONE CAPSULE BY MOUTH TWICE DAILY, Disp: 60 capsule, Rfl: 3 .  losartan (COZAAR) 50 MG tablet, Take 50 mg by mouth every morning. , Disp: , Rfl:  .  MELATONIN PO, Take 10 mg by mouth at bedtime. , Disp: , Rfl:  .  metoprolol succinate (TOPROL-XL) 50 MG 24 hr tablet, Take 50 mg by mouth every morning. , Disp: , Rfl:  .  sertraline (ZOLOFT) 100 MG tablet, Take by mouth. Takes one and one half tablets daily, Disp: , Rfl:  .  tamsulosin (FLOMAX) 0.4 MG CAPS capsule, Take 0.4 mg by mouth every morning. , Disp: , Rfl:  .  magic mouthwash w/lidocaine SOLN, Take 5 mLs by mouth 4 (four) times daily. (Patient not taking: Reported on 11/15/2016), Disp: 480 mL, Rfl: 3 .  nitroGLYCERIN (NITROSTAT) 0.4 MG SL tablet, Place 0.4 mg under the tongue every 5 (five) minutes as needed. , Disp: , Rfl:   Physical exam:  Vitals:   11/15/16 1002  BP: 139/83  Pulse: (!) 57  Resp: 20  Temp: 97.8 F (36.6 C)  TempSrc: Tympanic  Weight: 221 lb 5.5 oz (100.4 kg)  Height: '6\' 1"'$  (1.854 m)   Physical Exam  Constitutional: He is oriented to person, place, and time and well-developed, well-nourished, and in no distress.  HENT:  Head: Normocephalic and atraumatic.  Eyes: EOM are normal. Pupils are equal, round, and reactive to light.  Neck: Normal range of motion.  Cardiovascular: Normal rate, regular rhythm and normal heart sounds.    Pulmonary/Chest: Effort normal and breath sounds normal.  Abdominal: Soft. Bowel sounds are normal.  Neurological: He is alert and oriented to person, place, and time.  Skin: Skin is warm and dry.     CMP Latest Ref Rng & Units 11/15/2016  Glucose 65 - 99 mg/dL 129(H)  BUN 6 - 20 mg/dL 19  Creatinine 0.61 - 1.24 mg/dL 1.53(H)  Sodium 135 - 145 mmol/L 135  Potassium 3.5 - 5.1 mmol/L 3.8  Chloride 101 - 111 mmol/L 103  CO2 22 - 32 mmol/L 29  Calcium 8.9 - 10.3 mg/dL 9.0  Total Protein 6.5 - 8.1 g/dL 6.9  Total Bilirubin 0.3 - 1.2 mg/dL 0.4  Alkaline Phos 38 - 126 U/L 80  AST 15 - 41 U/L 35  ALT 17 - 63 U/L 30   CBC Latest Ref Rng & Units 11/15/2016  WBC 3.8 - 10.6 K/uL 7.3  Hemoglobin 13.0 - 18.0 g/dL  13.2  Hematocrit 40.0 - 52.0 % 37.9(L)  Platelets 150 - 440 K/uL 169    No images are attached to the encounter.  Dg Chest 2 View  Result Date: 10/24/2016 CLINICAL DATA:  Cough, congestion for 1-2 weeks. EXAM: CHEST  2 VIEW COMPARISON:  10/10/2016 FINDINGS: Heart is normal size. Areas of scarring in the right upper lobe. Left lung is clear. No effusions or acute bony abnormality. IMPRESSION: No active cardiopulmonary disease. Electronically Signed   By: Rolm Baptise M.D.   On: 10/24/2016 15:10   Ct Chest Wo Contrast  Result Date: 10/30/2016 CLINICAL DATA:  Cough and congestion. Shortness of breath for 2 weeks. History of left renal cell carcinoma and prior nephrectomy. Bilateral lung nodules. EXAM: CT CHEST WITHOUT CONTRAST TECHNIQUE: Multidetector CT imaging of the chest was performed following the standard protocol without IV contrast. COMPARISON:  08/31/2015 FINDINGS: Cardiovascular: Coronary, aortic arch, and branch vessel atherosclerotic vascular disease. Mediastinum/Nodes: Hypodense lesion in the left thyroid lobe, 1.9 cm in short axis, stable. Generally small mediastinal and hilar lymph nodes are observed. Lungs/Pleura: Prominent centrilobular emphysema. New (for example the  4 mm nodule in the right lower lobe on image 116 series 3) and enlarged bilateral pulmonary nodules scattered in all lobes of the lungs. Index left upper lobe pulmonary nodule 1.8 by 1.3 cm on image 33/3, formerly 1.4 by 1.0 cm. No pleural effusion. Upper Abdomen: Questionable splenomegaly. Mild indistinctness of tissue planes along the pancreatic tail with effacement of the fat stippling previously shown in this location on 02/01/2016, although some of this could be postoperative related to the interval nephrectomy since that time. Mild abdominal aortic atherosclerotic calcification. Musculoskeletal: Unremarkable IMPRESSION: 1. Scattered new and enlarging bilateral pulmonary nodules compatible with metastatic disease to the lungs. 2. Coronary, aortic arch, and branch vessel atherosclerotic vascular disease. 3. Prominent centrilobular emphysema. 4. Questionable splenomegaly. 5. Indistinctness of soft tissue planes along the pancreatic tail is most likely related to the prior nephrectomy and is not completely included on today's chest exam. Electronically Signed   By: Van Clines M.D.   On: 10/30/2016 14:09     Assessment and plan- Patient is a 75 y.o. male with a history of metastatic renal cell carcinoma with metastases to the lung currently on opdivo  1. Counts are okay to proceed with the next cycle of opdivo today. He will see Dr. Rogue Bussing 2 weeks from now with CBC and CMP prior to the next cycle of opdivo on 11/29/2016   Visit Diagnosis 1. Renal cell carcinoma of left kidney (HCC)      Dr. Randa Evens, MD, MPH [facility] Pager- [phone] 11/15/2016 8:28 AM

## 2016-11-21 DIAGNOSIS — D229 Melanocytic nevi, unspecified: Secondary | ICD-10-CM | POA: Diagnosis not present

## 2016-11-21 DIAGNOSIS — L570 Actinic keratosis: Secondary | ICD-10-CM | POA: Diagnosis not present

## 2016-11-21 DIAGNOSIS — C4491 Basal cell carcinoma of skin, unspecified: Secondary | ICD-10-CM | POA: Diagnosis not present

## 2016-11-21 DIAGNOSIS — Z8589 Personal history of malignant neoplasm of other organs and systems: Secondary | ICD-10-CM | POA: Diagnosis not present

## 2016-11-21 DIAGNOSIS — C44712 Basal cell carcinoma of skin of right lower limb, including hip: Secondary | ICD-10-CM | POA: Diagnosis not present

## 2016-11-29 ENCOUNTER — Inpatient Hospital Stay (HOSPITAL_BASED_OUTPATIENT_CLINIC_OR_DEPARTMENT_OTHER): Payer: PPO | Admitting: Internal Medicine

## 2016-11-29 ENCOUNTER — Inpatient Hospital Stay: Payer: PPO | Attending: Internal Medicine

## 2016-11-29 ENCOUNTER — Inpatient Hospital Stay: Payer: PPO

## 2016-11-29 VITALS — BP 133/78 | HR 57 | Temp 96.1°F | Wt 222.0 lb

## 2016-11-29 DIAGNOSIS — Z79899 Other long term (current) drug therapy: Secondary | ICD-10-CM | POA: Insufficient documentation

## 2016-11-29 DIAGNOSIS — C7801 Secondary malignant neoplasm of right lung: Secondary | ICD-10-CM | POA: Insufficient documentation

## 2016-11-29 DIAGNOSIS — I129 Hypertensive chronic kidney disease with stage 1 through stage 4 chronic kidney disease, or unspecified chronic kidney disease: Secondary | ICD-10-CM | POA: Insufficient documentation

## 2016-11-29 DIAGNOSIS — C496 Malignant neoplasm of connective and soft tissue of trunk, unspecified: Secondary | ICD-10-CM | POA: Diagnosis not present

## 2016-11-29 DIAGNOSIS — Z5112 Encounter for antineoplastic immunotherapy: Secondary | ICD-10-CM | POA: Insufficient documentation

## 2016-11-29 DIAGNOSIS — I10 Essential (primary) hypertension: Secondary | ICD-10-CM

## 2016-11-29 DIAGNOSIS — I2510 Atherosclerotic heart disease of native coronary artery without angina pectoris: Secondary | ICD-10-CM

## 2016-11-29 DIAGNOSIS — E039 Hypothyroidism, unspecified: Secondary | ICD-10-CM | POA: Insufficient documentation

## 2016-11-29 DIAGNOSIS — N189 Chronic kidney disease, unspecified: Secondary | ICD-10-CM

## 2016-11-29 DIAGNOSIS — C7802 Secondary malignant neoplasm of left lung: Secondary | ICD-10-CM | POA: Insufficient documentation

## 2016-11-29 DIAGNOSIS — Z87891 Personal history of nicotine dependence: Secondary | ICD-10-CM | POA: Insufficient documentation

## 2016-11-29 DIAGNOSIS — Z9221 Personal history of antineoplastic chemotherapy: Secondary | ICD-10-CM

## 2016-11-29 DIAGNOSIS — C642 Malignant neoplasm of left kidney, except renal pelvis: Secondary | ICD-10-CM

## 2016-11-29 DIAGNOSIS — Z905 Acquired absence of kidney: Secondary | ICD-10-CM | POA: Diagnosis not present

## 2016-11-29 DIAGNOSIS — R0602 Shortness of breath: Secondary | ICD-10-CM

## 2016-11-29 DIAGNOSIS — Z87442 Personal history of urinary calculi: Secondary | ICD-10-CM | POA: Diagnosis not present

## 2016-11-29 DIAGNOSIS — R946 Abnormal results of thyroid function studies: Secondary | ICD-10-CM | POA: Insufficient documentation

## 2016-11-29 DIAGNOSIS — Z7982 Long term (current) use of aspirin: Secondary | ICD-10-CM | POA: Diagnosis not present

## 2016-11-29 LAB — COMPREHENSIVE METABOLIC PANEL
ALK PHOS: 73 U/L (ref 38–126)
ALT: 27 U/L (ref 17–63)
AST: 33 U/L (ref 15–41)
Albumin: 4.2 g/dL (ref 3.5–5.0)
Anion gap: 5 (ref 5–15)
BUN: 17 mg/dL (ref 6–20)
CHLORIDE: 105 mmol/L (ref 101–111)
CO2: 25 mmol/L (ref 22–32)
CREATININE: 1.59 mg/dL — AB (ref 0.61–1.24)
Calcium: 8.9 mg/dL (ref 8.9–10.3)
GFR calc Af Amer: 47 mL/min — ABNORMAL LOW (ref >60)
GFR calc non Af Amer: 41 mL/min — ABNORMAL LOW (ref >60)
GLUCOSE: 160 mg/dL — AB (ref 65–99)
Potassium: 3.9 mmol/L (ref 3.5–5.1)
Sodium: 135 mmol/L (ref 135–145)
Total Bilirubin: 0.6 mg/dL (ref 0.3–1.2)
Total Protein: 6.9 g/dL (ref 6.5–8.1)

## 2016-11-29 LAB — CBC WITH DIFFERENTIAL/PLATELET
BASOS ABS: 0 10*3/uL (ref 0–0.1)
Basophils Relative: 1 %
EOS ABS: 0.3 10*3/uL (ref 0–0.7)
EOS PCT: 5 %
HCT: 37.5 % — ABNORMAL LOW (ref 40.0–52.0)
HEMOGLOBIN: 13 g/dL (ref 13.0–18.0)
LYMPHS PCT: 20 %
Lymphs Abs: 1.2 10*3/uL (ref 1.0–3.6)
MCH: 28.4 pg (ref 26.0–34.0)
MCHC: 34.7 g/dL (ref 32.0–36.0)
MCV: 81.9 fL (ref 80.0–100.0)
Monocytes Absolute: 0.6 10*3/uL (ref 0.2–1.0)
Monocytes Relative: 9 %
Neutro Abs: 4 10*3/uL (ref 1.4–6.5)
Neutrophils Relative %: 65 %
PLATELETS: 127 10*3/uL — AB (ref 150–440)
RBC: 4.58 MIL/uL (ref 4.40–5.90)
RDW: 14.9 % — ABNORMAL HIGH (ref 11.5–14.5)
WBC: 6.1 10*3/uL (ref 3.8–10.6)

## 2016-11-29 MED ORDER — NIVOLUMAB CHEMO INJECTION 100 MG/10ML
240.0000 mg | Freq: Once | INTRAVENOUS | Status: AC
  Administered 2016-11-29: 240 mg via INTRAVENOUS
  Filled 2016-11-29: qty 4

## 2016-11-29 MED ORDER — SODIUM CHLORIDE 0.9 % IV SOLN
Freq: Once | INTRAVENOUS | Status: AC
  Administered 2016-11-29: 11:00:00 via INTRAVENOUS
  Filled 2016-11-29: qty 1000

## 2016-11-29 NOTE — Progress Notes (Signed)
Creatinine: 1.59. MD, Dr. Rogue Bussing, notified via telephone and already aware. Per MD order: proceed with Opdivo treatment today as scheduled.

## 2016-11-29 NOTE — Progress Notes (Signed)
Patient here today for follow up.  Patient seen dermatologist last week, had some places on right leg removed and sent off for testing.

## 2016-11-29 NOTE — Progress Notes (Signed)
[place] OFFICE PROGRESS NOTE  Patient Care Team: Dion Body, MD as PCP - General (Family Medicine) Jannet Mantis, MD (Dermatology) Robert Bellow, MD (General Surgery) Hollice Espy, MD as Consulting Physician (Urology)  No matching staging information was found for the patient.   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  male pleasant patient above history of Newly diagnosed renal cell cancer status post left nephrectomy- With metastases to the lung- Is currently started on Opdivo status post cycle 6 Is here for follow-up.  In the interim he states to have a "melanoma" resected off his left  Leg. He is awaiting on the final pathology from the dermatologist office.  Patient has mild shortness of breath with exertion. Otherwise no cough. Appetite is good. No chronic diarrhea.  He is fairly active. No chest pain. No pain anywhere else. Appetite is good. No weight loss. Weight gain.   REVIEW OF SYSTEMS:  A complete 10 point review of system is done which is negative except mentioned above/history of present illness.   PAST MEDICAL HISTORY :  Past Medical History:  Diagnosis Date  . CAD (coronary artery disease)   . Cancer ([place])    left arm   . Hypertension   . Kidney stone     PAST SURGICAL HISTORY :   Past Surgical History:  Procedure Laterality Date  . arm surgery Left    arm  . cardiac stents  2011   Angioplasty / Stenting Femoral-X2  . COLONOSCOPY  04/01/12  . CORONARY ANGIOPLASTY    . EXCISION MELANOMA WITH SENTINEL LYMPH NODE BIOPSY Right 01/24/2016   Procedure: EXCISION MELANOMA Right Shoulder;  Surgeon: Robert Bellow, MD;  Location: [facility] ORS;  Service: General;  Laterality: Right;  . LAPAROSCOPIC NEPHRECTOMY, HAND ASSISTED Left 04/24/2016   Procedure: HAND ASSISTED LAPAROSCOPIC NEPHRECTOMY;  Surgeon: Hollice Espy, MD;  Location: [facility] ORS;  Service: Urology;  Laterality: Left;  Marland Kitchen VIDEO ASSISTED THORACOSCOPY (VATS)/THOROCOTOMY Left 03/08/2016   Procedure: VIDEO ASSISTED THORACOSCOPY (VATS) with lung biopsy - Left ;  Surgeon: Robert Bellow, MD;  Location: [facility] ORS;  Service: General;  Laterality: Left;    FAMILY HISTORY :   Family History  Problem Relation Age of Onset  . Hodgkin's lymphoma Daughter 5  . Kidney cancer Neg Hx   . Kidney disease Neg Hx   . Prostate cancer Neg Hx     SOCIAL HISTORY:   Social History  Substance Use Topics  . Smoking status: Former Smoker    Types: Cigarettes    Start date: 01/18/1956    Quit date: 01/17/1969  . Smokeless tobacco: Never Used  . Alcohol use No     Comment: BEER OCC    ALLERGIES:  is allergic  to lisinopril and other.  MEDICATIONS:  Current Outpatient Prescriptions  Medication Sig Dispense Refill  . aspirin EC 81 MG tablet Take 81 mg by mouth daily.    Marland Kitchen atorvastatin (LIPITOR) 40 MG tablet Take 40 mg by mouth daily.    . clopidogrel (PLAVIX) 75 MG tablet Take 75 mg by mouth daily.    Marland Kitchen docusate sodium (COLACE) 100 MG capsule TAKE ONE CAPSULE BY MOUTH TWICE DAILY 60 capsule 3  . losartan (COZAAR) 50 MG tablet Take 50 mg by mouth every morning.     . magic mouthwash w/lidocaine SOLN Take 5 mLs by mouth 4 (four) times daily. 480 mL 3  . MELATONIN PO Take  10 mg by mouth at bedtime.     . metoprolol succinate (TOPROL-XL) 50 MG 24 hr tablet Take 50 mg by mouth every morning.     . nitroGLYCERIN (NITROSTAT) 0.4 MG SL tablet Place 0.4 mg under the tongue every 5 (five) minutes as needed.     . sertraline (ZOLOFT) 100 MG tablet Take by mouth. Takes one and one half tablets daily    . tamsulosin (FLOMAX) 0.4 MG CAPS capsule Take 0.4 mg by mouth every morning.      No current facility-administered medications for this visit.     PHYSICAL EXAMINATION: ECOG PERFORMANCE STATUS: 0 - Asymptomatic  BP 133/78 (BP Location: Left Arm, Patient Position: Sitting)   Pulse (!) 57   Temp (!) 96.1 F (35.6 C) (Tympanic)   Wt 222 lb (100.7 kg)   BMI 29.29 kg/m   Filed Weights   11/29/16 0951  Weight: 222 lb (100.7 kg)    GENERAL: Well-nourished well-developed; Alert, no distress and comfortable. Accompanied by wifeAnd daughter. EYES: no pallor or icterus OROPHARYNX: no thrush or ulceration; NECK: supple, no masses felt LYMPH:  no palpable lymphadenopathy in the cervical, axillary or inguinal regions LUNGS: clear to auscultation and  No wheeze or crackles HEART/CVS: regular rate & rhythm and no murmurs; No lower extremity edema ABDOMEN:abdomen soft, non-tender and normal bowel sounds Musculoskeletal:no cyanosis of digits and no clubbing  PSYCH: alert & oriented x 3 with fluent speech NEURO: no focal motor/sensory deficits SKIN:  no rashes or significant lesions; nephrectomy incisions healing well.  LABORATORY DATA:  I have reviewed the data as listed    Component Value Date/Time   NA 135 11/29/2016 0921   NA 146 (H) 05/25/2016 1514   K 3.9 11/29/2016 0921   CL 105 11/29/2016 0921   CO2 25 11/29/2016 0921   GLUCOSE 160 (H) 11/29/2016 0921   BUN 17 11/29/2016 0921   BUN 19 05/25/2016 1514   CREATININE 1.59 (H) 11/29/2016 0921   CALCIUM 8.9 11/29/2016 0921   PROT 6.9 11/29/2016 0921   ALBUMIN 4.2 11/29/2016 0921   AST 33 11/29/2016 0921    ALT 27 11/29/2016 0921   ALKPHOS 73 11/29/2016 0921   BILITOT 0.6 11/29/2016 0921   GFRNONAA 41 (L) 11/29/2016 0921   GFRAA 47 (L) 11/29/2016 0921    No results found for: SPEP, UPEP  Lab Results  Component Value Date   WBC 6.1 11/29/2016   NEUTROABS 4.0 11/29/2016   HGB 13.0 11/29/2016   HCT 37.5 (L) 11/29/2016   MCV 81.9 11/29/2016   PLT 127 (L) 11/29/2016      Chemistry      Component Value Date/Time   NA 135 11/29/2016 0921   NA 146 (H) 05/25/2016 1514   K 3.9 11/29/2016 0921   CL 105 11/29/2016  0921   CO2 25 11/29/2016 0921   BUN 17 11/29/2016 0921   BUN 19 05/25/2016 1514   CREATININE 1.59 (H) 11/29/2016 0921      Component Value Date/Time   CALCIUM 8.9 11/29/2016 0921   ALKPHOS 73 11/29/2016 0921   AST 33 11/29/2016 0921   ALT 27 11/29/2016 0921   BILITOT 0.6 11/29/2016 0921     RADIOGRAPHIC STUDIES: I have personally reviewed the radiological images as listed and agreed with the findings in the report. No results found.  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 Stage IV; DEC 8th CT lung- Progression Bil lung nodules [compared to Oct 2017]; discussed the rationale for continuing the opdivo.   # Currently on OPDIVO; proceed with cycle #7 today.Tolerating well without major side effects- Except for slightly abnormal TSH  # hypothyroidism- subclinical  [Oct 2017-TSH 5]; repeat thyroid profile at next visit.   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   # skin lesion ? Melanoma s/p Excision [Dr.Graham; [place]]  # follow up in 2 weeks/labs/ opdivo.   Orders Placed This Encounter  Procedures  . Thyroid Panel With TSH    Standing Status:   Future    Standing Expiration Date:   11/29/2017      Cammie Sickle, MD 11/29/2016 4:15 PM

## 2016-11-29 NOTE — Assessment & Plan Note (Signed)
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 Stage IV; DEC 8th CT lung- Progression Bil lung nodules [compared to Oct 2017]; discussed the rationale for continuing the opdivo.   # Currently on OPDIVO; proceed with cycle #7 today.Tolerating well without major side effects- Except for slightly abnormal TSH  # hypothyroidism- subclinical  [Oct 2017-TSH 5]; repeat thyroid profile at next visit.   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   # skin lesion ? Melanoma s/p Excision [Dr.Graham; [place]]  # follow up in 2 weeks/labs/ opdivo.

## 2016-12-03 ENCOUNTER — Telehealth: Payer: Self-pay | Admitting: *Deleted

## 2016-12-03 NOTE — Telephone Encounter (Addendum)
Per Dr B, Will wait and discuss at his next appt 1/25. Glen Davis informed of this and stated alright, thank you

## 2016-12-03 NOTE — Telephone Encounter (Signed)
The pathology has come back from his leg biopsy and it showed Melanoma he will be going back Wed for re excision. Asking if Dr Rogue Bussing will order a scan for him. (I have contacted Dr Sherlene Shams office to request they fax a copy of the path report to [place]) Please advise

## 2016-12-04 DIAGNOSIS — B079 Viral wart, unspecified: Secondary | ICD-10-CM | POA: Diagnosis not present

## 2016-12-04 DIAGNOSIS — C44712 Basal cell carcinoma of skin of right lower limb, including hip: Secondary | ICD-10-CM | POA: Diagnosis not present

## 2016-12-13 ENCOUNTER — Inpatient Hospital Stay: Payer: PPO

## 2016-12-13 ENCOUNTER — Inpatient Hospital Stay (HOSPITAL_BASED_OUTPATIENT_CLINIC_OR_DEPARTMENT_OTHER): Payer: PPO | Admitting: Internal Medicine

## 2016-12-13 VITALS — BP 131/73 | HR 53 | Temp 97.3°F | Resp 20 | Ht 73.0 in | Wt 222.0 lb

## 2016-12-13 DIAGNOSIS — Z9221 Personal history of antineoplastic chemotherapy: Secondary | ICD-10-CM

## 2016-12-13 DIAGNOSIS — Z7982 Long term (current) use of aspirin: Secondary | ICD-10-CM

## 2016-12-13 DIAGNOSIS — E039 Hypothyroidism, unspecified: Secondary | ICD-10-CM

## 2016-12-13 DIAGNOSIS — N189 Chronic kidney disease, unspecified: Secondary | ICD-10-CM

## 2016-12-13 DIAGNOSIS — Z87891 Personal history of nicotine dependence: Secondary | ICD-10-CM

## 2016-12-13 DIAGNOSIS — Z87442 Personal history of urinary calculi: Secondary | ICD-10-CM

## 2016-12-13 DIAGNOSIS — Z905 Acquired absence of kidney: Secondary | ICD-10-CM

## 2016-12-13 DIAGNOSIS — R946 Abnormal results of thyroid function studies: Secondary | ICD-10-CM

## 2016-12-13 DIAGNOSIS — C642 Malignant neoplasm of left kidney, except renal pelvis: Secondary | ICD-10-CM

## 2016-12-13 DIAGNOSIS — I129 Hypertensive chronic kidney disease with stage 1 through stage 4 chronic kidney disease, or unspecified chronic kidney disease: Secondary | ICD-10-CM

## 2016-12-13 DIAGNOSIS — C496 Malignant neoplasm of connective and soft tissue of trunk, unspecified: Secondary | ICD-10-CM

## 2016-12-13 DIAGNOSIS — C7801 Secondary malignant neoplasm of right lung: Secondary | ICD-10-CM

## 2016-12-13 DIAGNOSIS — C7802 Secondary malignant neoplasm of left lung: Secondary | ICD-10-CM | POA: Diagnosis not present

## 2016-12-13 DIAGNOSIS — I10 Essential (primary) hypertension: Secondary | ICD-10-CM

## 2016-12-13 DIAGNOSIS — Z79899 Other long term (current) drug therapy: Secondary | ICD-10-CM

## 2016-12-13 DIAGNOSIS — R0602 Shortness of breath: Secondary | ICD-10-CM

## 2016-12-13 DIAGNOSIS — Z5112 Encounter for antineoplastic immunotherapy: Secondary | ICD-10-CM | POA: Diagnosis not present

## 2016-12-13 DIAGNOSIS — I2510 Atherosclerotic heart disease of native coronary artery without angina pectoris: Secondary | ICD-10-CM

## 2016-12-13 LAB — CBC WITH DIFFERENTIAL/PLATELET
Basophils Absolute: 0 10*3/uL (ref 0–0.1)
Basophils Relative: 1 %
EOS ABS: 0.2 10*3/uL (ref 0–0.7)
Eosinophils Relative: 3 %
HCT: 37.9 % — ABNORMAL LOW (ref 40.0–52.0)
Hemoglobin: 13.2 g/dL (ref 13.0–18.0)
LYMPHS ABS: 0.9 10*3/uL — AB (ref 1.0–3.6)
LYMPHS PCT: 13 %
MCH: 28.2 pg (ref 26.0–34.0)
MCHC: 34.8 g/dL (ref 32.0–36.0)
MCV: 81.1 fL (ref 80.0–100.0)
MONO ABS: 0.6 10*3/uL (ref 0.2–1.0)
MONOS PCT: 8 %
Neutro Abs: 5.2 10*3/uL (ref 1.4–6.5)
Neutrophils Relative %: 75 %
Platelets: 167 10*3/uL (ref 150–440)
RBC: 4.67 MIL/uL (ref 4.40–5.90)
RDW: 15.2 % — AB (ref 11.5–14.5)
WBC: 6.9 10*3/uL (ref 3.8–10.6)

## 2016-12-13 LAB — COMPREHENSIVE METABOLIC PANEL
ALK PHOS: 76 U/L (ref 38–126)
ALT: 25 U/L (ref 17–63)
AST: 32 U/L (ref 15–41)
Albumin: 4.2 g/dL (ref 3.5–5.0)
Anion gap: 7 (ref 5–15)
BILIRUBIN TOTAL: 0.6 mg/dL (ref 0.3–1.2)
BUN: 20 mg/dL (ref 6–20)
CO2: 25 mmol/L (ref 22–32)
Calcium: 9.2 mg/dL (ref 8.9–10.3)
Chloride: 103 mmol/L (ref 101–111)
Creatinine, Ser: 1.59 mg/dL — ABNORMAL HIGH (ref 0.61–1.24)
GFR calc Af Amer: 47 mL/min — ABNORMAL LOW (ref >60)
GFR, EST NON AFRICAN AMERICAN: 41 mL/min — AB (ref >60)
Glucose, Bld: 159 mg/dL — ABNORMAL HIGH (ref 65–99)
Potassium: 3.9 mmol/L (ref 3.5–5.1)
Sodium: 135 mmol/L (ref 135–145)
TOTAL PROTEIN: 7.4 g/dL (ref 6.5–8.1)

## 2016-12-13 MED ORDER — SODIUM CHLORIDE 0.9 % IV SOLN
Freq: Once | INTRAVENOUS | Status: AC
  Administered 2016-12-13: 11:00:00 via INTRAVENOUS
  Filled 2016-12-13: qty 1000

## 2016-12-13 MED ORDER — SODIUM CHLORIDE 0.9 % IV SOLN
240.0000 mg | Freq: Once | INTRAVENOUS | Status: AC
  Administered 2016-12-13: 240 mg via INTRAVENOUS
  Filled 2016-12-13: qty 20

## 2016-12-13 NOTE — Progress Notes (Signed)
[place] OFFICE PROGRESS NOTE  Patient Care Team: Dion Body, MD as PCP - General (Family Medicine) Jannet Mantis, MD (Dermatology) Robert Bellow, MD (General Surgery) Hollice Espy, MD as Consulting Physician (Urology)  No matching staging information was found for the patient.   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  male pleasant patient above history of Newly diagnosed renal cell cancer status post left nephrectomy- With metastases to the lung- Is currently started on Opdivo status post cycle 7 Is here for follow-up.    Denies any shortness of breath or cough.  Appetite is good. No chronic diarrhea.  He is fairly active. No chest pain. No pain anywhere else. Appetite is good. No weight loss. Weight gain.   REVIEW OF SYSTEMS:  A complete 10 point review of system is done which is negative except mentioned above/history of present illness.   PAST MEDICAL HISTORY :  Past Medical History:  Diagnosis Date  . CAD (coronary artery disease)   . Cancer ([place])    left arm  . Hypertension   . Kidney stone     PAST SURGICAL HISTORY :   Past Surgical History:  Procedure Laterality Date  . arm surgery Left    arm  . cardiac stents  2011    Angioplasty / Stenting Femoral-X2  . COLONOSCOPY  04/01/12  . CORONARY ANGIOPLASTY    . EXCISION MELANOMA WITH SENTINEL LYMPH NODE BIOPSY Right 01/24/2016   Procedure: EXCISION MELANOMA Right Shoulder;  Surgeon: Robert Bellow, MD;  Location: [facility] ORS;  Service: General;  Laterality: Right;  . LAPAROSCOPIC NEPHRECTOMY, HAND ASSISTED Left 04/24/2016   Procedure: HAND ASSISTED LAPAROSCOPIC NEPHRECTOMY;  Surgeon: Hollice Espy, MD;  Location: [facility] ORS;  Service: Urology;  Laterality: Left;  Marland Kitchen VIDEO ASSISTED THORACOSCOPY (VATS)/THOROCOTOMY Left 03/08/2016   Procedure: VIDEO ASSISTED THORACOSCOPY (VATS) with lung biopsy - Left ;  Surgeon: Robert Bellow, MD;  Location: [facility] ORS;  Service: General;  Laterality: Left;    FAMILY HISTORY :   Family History  Problem Relation Age of Onset  . Hodgkin's lymphoma Daughter 5  . Kidney cancer Neg Hx   . Kidney disease Neg Hx   . Prostate cancer Neg Hx     SOCIAL HISTORY:   Social History  Substance Use Topics  . Smoking status: Former Smoker    Types: Cigarettes    Start date: 01/18/1956    Quit date: 01/17/1969  . Smokeless tobacco: Never Used  . Alcohol use No     Comment: BEER OCC    ALLERGIES:  is allergic to lisinopril and other.  MEDICATIONS:  Current Outpatient Prescriptions  Medication Sig Dispense Refill  . aspirin EC 81 MG tablet Take 81 mg by mouth daily.    Marland Kitchen  atorvastatin (LIPITOR) 40 MG tablet Take 40 mg by mouth daily.    . clopidogrel (PLAVIX) 75 MG tablet Take 75 mg by mouth daily.    Marland Kitchen docusate sodium (COLACE) 100 MG capsule TAKE ONE CAPSULE BY MOUTH TWICE DAILY 60 capsule 3  . losartan (COZAAR) 50 MG tablet Take 50 mg by mouth every morning.     . magic mouthwash w/lidocaine SOLN Take 5 mLs by mouth 4 (four) times daily. 480 mL 3  . MELATONIN PO Take 10 mg by mouth at bedtime.     . metoprolol succinate (TOPROL-XL) 50 MG 24 hr tablet Take 50 mg by mouth every morning.     . sertraline (ZOLOFT) 100 MG tablet Take by  mouth. Takes one and one half tablets daily    . tamsulosin (FLOMAX) 0.4 MG CAPS capsule Take 0.4 mg by mouth every morning.     Marland Kitchen HYDROcodone-acetaminophen (NORCO/VICODIN) 5-325 MG tablet Take 1 tablet by mouth every 6 (six) hours as needed for pain.    . nitroGLYCERIN (NITROSTAT) 0.4 MG SL tablet Place 0.4 mg under the tongue every 5 (five) minutes as needed.      No current facility-administered medications for this visit.     PHYSICAL EXAMINATION: ECOG PERFORMANCE STATUS: 0 - Asymptomatic  BP 131/73 (BP Location: Left Arm, Patient Position: Sitting)   Pulse (!) 53   Temp 97.3 F (36.3 C) (Tympanic)   Resp 20   Ht '6\' 1"'$  (1.854 m)   Wt 222 lb (100.7 kg)   BMI 29.29 kg/m   Filed Weights   12/13/16 0921  Weight: 222 lb (100.7 kg)    GENERAL: Well-nourished well-developed; Alert, no distress and comfortable. Accompanied by his daughter. EYES: no pallor or icterus OROPHARYNX: no thrush or ulceration; NECK: supple, no masses felt LYMPH:  no palpable lymphadenopathy in the cervical, axillary or inguinal regions LUNGS: clear to auscultation and  No wheeze or crackles HEART/CVS: regular rate & rhythm and no murmurs; No lower extremity edema ABDOMEN:abdomen soft, non-tender and normal bowel sounds Musculoskeletal:no cyanosis of digits and no clubbing  PSYCH: alert & oriented x 3 with fluent speech NEURO: no focal motor/sensory deficits SKIN:  no rashes or significant lesions; nephrectomy incisions healing well.  LABORATORY DATA:  I have reviewed the data as listed    Component Value Date/Time   NA 135 12/13/2016 0901   NA 146 (H) 05/25/2016 1514   K 3.9 12/13/2016 0901   CL 103 12/13/2016 0901   CO2 25 12/13/2016 0901   GLUCOSE 159 (H) 12/13/2016 0901   BUN 20 12/13/2016 0901   BUN 19 05/25/2016 1514   CREATININE 1.59 (H) 12/13/2016 0901   CALCIUM 9.2 12/13/2016 0901   PROT 7.4 12/13/2016 0901   ALBUMIN 4.2 12/13/2016 0901   AST 32 12/13/2016 0901   ALT 25 12/13/2016  0901   ALKPHOS 76 12/13/2016 0901   BILITOT 0.6 12/13/2016 0901   GFRNONAA 41 (L) 12/13/2016 0901   GFRAA 47 (L) 12/13/2016 0901    No results found for: SPEP, UPEP  Lab Results  Component Value Date   WBC 6.9 12/13/2016   NEUTROABS 5.2 12/13/2016   HGB 13.2 12/13/2016   HCT 37.9 (L) 12/13/2016   MCV 81.1 12/13/2016   PLT 167 12/13/2016      Chemistry      Component Value Date/Time   NA 135 12/13/2016 0901   NA 146 (H) 05/25/2016 1514   K 3.9 12/13/2016 0901   CL 103 12/13/2016 0901  CO2 25 12/13/2016 0901   BUN 20 12/13/2016 0901   BUN 19 05/25/2016 1514   CREATININE 1.59 (H) 12/13/2016 0901      Component Value Date/Time   CALCIUM 9.2 12/13/2016 0901   ALKPHOS 76 12/13/2016 0901   AST 32 12/13/2016 0901   ALT 25 12/13/2016 0901   BILITOT 0.6 12/13/2016 0901     RADIOGRAPHIC STUDIES: I have personally reviewed the radiological images as listed and agreed with the findings in the report. No results found.  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 Stage IV; DEC 8th CT lung- Progression Bil lung nodules [compared to Oct 2017]; discussed the rationale for continuing the opdivo. Will order CT at next visit.   # Currently on OPDIVO; proceed with cycle #8 today.Tolerating well without major side effects- Except for slightly abnormal TSH  # hypothyroidism- subclinical  [Oct 2017-TSH 5];  thyroid profile today pending.   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   # skin lesion ? Melanoma s/p Excision [Dr.Graham; [place]]  # follow up in 2 weeks/labs/ opdivo.   Orders Placed This Encounter  Procedures  . CBC with Differential/Platelet    Standing Status:   Future    Standing Expiration Date:   06/12/2017  . Comprehensive metabolic panel    Standing Status:   Future    Standing Expiration Date:   06/12/2017      Cammie Sickle, MD 12/14/2016 6:30 PM

## 2016-12-13 NOTE — Assessment & Plan Note (Addendum)
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 Stage IV; DEC 8th CT lung- Progression Bil lung nodules [compared to Oct 2017]; discussed the rationale for continuing the opdivo. Will order CT at next visit.   # Currently on OPDIVO; proceed with cycle #8 today.Tolerating well without major side effects- Except for slightly abnormal TSH  # hypothyroidism- subclinical  [Oct 2017-TSH 5];  thyroid profile today pending.   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   # skin lesion ?Stage  Melanoma s/p Excision [Dr.Graham; [place]]  # follow up in 2 weeks/labs/ opdivo.

## 2016-12-13 NOTE — Progress Notes (Signed)
Patient had a melanoma on right leg lateral side removed. Sutures still present. He was given an norco script for pain r/t to this surgical incision. However, he has not had to use this script.  He has no medical complaints today.

## 2016-12-14 ENCOUNTER — Telehealth: Payer: Self-pay | Admitting: *Deleted

## 2016-12-14 LAB — THYROID PANEL WITH TSH
FREE THYROXINE INDEX: 1.4 (ref 1.2–4.9)
T3 Uptake Ratio: 28 % (ref 24–39)
T4 TOTAL: 5.1 ug/dL (ref 4.5–12.0)
TSH: 3.43 u[IU]/mL (ref 0.450–4.500)

## 2016-12-14 NOTE — Telephone Encounter (Signed)
Advised that per Dr Rogue Bussing too early to repeat scan and it would not be beneficial to do so at this time. She expressed understanding

## 2016-12-14 NOTE — Telephone Encounter (Signed)
Wants scan of his lungs to see if cancer is responding to treatment and not wait till next month to get it done for their peace of mind. Please advise

## 2016-12-27 ENCOUNTER — Inpatient Hospital Stay: Payer: PPO

## 2016-12-27 ENCOUNTER — Inpatient Hospital Stay: Payer: PPO | Attending: Internal Medicine | Admitting: Internal Medicine

## 2016-12-27 VITALS — BP 127/79 | HR 57 | Temp 95.8°F | Wt 222.5 lb

## 2016-12-27 DIAGNOSIS — Z7982 Long term (current) use of aspirin: Secondary | ICD-10-CM | POA: Insufficient documentation

## 2016-12-27 DIAGNOSIS — C642 Malignant neoplasm of left kidney, except renal pelvis: Secondary | ICD-10-CM

## 2016-12-27 DIAGNOSIS — Z5112 Encounter for antineoplastic immunotherapy: Secondary | ICD-10-CM | POA: Diagnosis not present

## 2016-12-27 DIAGNOSIS — I129 Hypertensive chronic kidney disease with stage 1 through stage 4 chronic kidney disease, or unspecified chronic kidney disease: Secondary | ICD-10-CM | POA: Diagnosis not present

## 2016-12-27 DIAGNOSIS — Z79899 Other long term (current) drug therapy: Secondary | ICD-10-CM | POA: Insufficient documentation

## 2016-12-27 DIAGNOSIS — Z807 Family history of other malignant neoplasms of lymphoid, hematopoietic and related tissues: Secondary | ICD-10-CM | POA: Diagnosis not present

## 2016-12-27 DIAGNOSIS — I2510 Atherosclerotic heart disease of native coronary artery without angina pectoris: Secondary | ICD-10-CM

## 2016-12-27 DIAGNOSIS — Z85828 Personal history of other malignant neoplasm of skin: Secondary | ICD-10-CM | POA: Diagnosis not present

## 2016-12-27 DIAGNOSIS — C7800 Secondary malignant neoplasm of unspecified lung: Secondary | ICD-10-CM | POA: Insufficient documentation

## 2016-12-27 DIAGNOSIS — E039 Hypothyroidism, unspecified: Secondary | ICD-10-CM | POA: Insufficient documentation

## 2016-12-27 DIAGNOSIS — Z87442 Personal history of urinary calculi: Secondary | ICD-10-CM | POA: Insufficient documentation

## 2016-12-27 DIAGNOSIS — Z87891 Personal history of nicotine dependence: Secondary | ICD-10-CM

## 2016-12-27 LAB — COMPREHENSIVE METABOLIC PANEL
ALBUMIN: 4 g/dL (ref 3.5–5.0)
ALT: 24 U/L (ref 17–63)
AST: 29 U/L (ref 15–41)
Alkaline Phosphatase: 74 U/L (ref 38–126)
Anion gap: 6 (ref 5–15)
BUN: 19 mg/dL (ref 6–20)
CHLORIDE: 103 mmol/L (ref 101–111)
CO2: 27 mmol/L (ref 22–32)
CREATININE: 1.95 mg/dL — AB (ref 0.61–1.24)
Calcium: 9 mg/dL (ref 8.9–10.3)
GFR calc Af Amer: 37 mL/min — ABNORMAL LOW (ref >60)
GFR calc non Af Amer: 32 mL/min — ABNORMAL LOW (ref >60)
Glucose, Bld: 132 mg/dL — ABNORMAL HIGH (ref 65–99)
Potassium: 4.1 mmol/L (ref 3.5–5.1)
SODIUM: 136 mmol/L (ref 135–145)
Total Bilirubin: 0.6 mg/dL (ref 0.3–1.2)
Total Protein: 7 g/dL (ref 6.5–8.1)

## 2016-12-27 LAB — CBC WITH DIFFERENTIAL/PLATELET
BASOS PCT: 1 %
Basophils Absolute: 0 10*3/uL (ref 0–0.1)
EOS ABS: 0.3 10*3/uL (ref 0–0.7)
EOS PCT: 4 %
HCT: 37 % — ABNORMAL LOW (ref 40.0–52.0)
Hemoglobin: 12.6 g/dL — ABNORMAL LOW (ref 13.0–18.0)
LYMPHS PCT: 16 %
Lymphs Abs: 1 10*3/uL (ref 1.0–3.6)
MCH: 27.5 pg (ref 26.0–34.0)
MCHC: 34.1 g/dL (ref 32.0–36.0)
MCV: 80.7 fL (ref 80.0–100.0)
Monocytes Absolute: 0.6 10*3/uL (ref 0.2–1.0)
Monocytes Relative: 9 %
Neutro Abs: 4.4 10*3/uL (ref 1.4–6.5)
Neutrophils Relative %: 70 %
PLATELETS: 141 10*3/uL — AB (ref 150–440)
RBC: 4.58 MIL/uL (ref 4.40–5.90)
RDW: 15.1 % — ABNORMAL HIGH (ref 11.5–14.5)
WBC: 6.2 10*3/uL (ref 3.8–10.6)

## 2016-12-27 MED ORDER — SODIUM CHLORIDE 0.9 % IV SOLN
240.0000 mg | Freq: Once | INTRAVENOUS | Status: AC
  Administered 2016-12-27: 240 mg via INTRAVENOUS
  Filled 2016-12-27: qty 20

## 2016-12-27 MED ORDER — SODIUM CHLORIDE 0.9 % IV SOLN
Freq: Once | INTRAVENOUS | Status: AC
  Administered 2016-12-27: 10:00:00 via INTRAVENOUS
  Filled 2016-12-27: qty 1000

## 2016-12-27 NOTE — Assessment & Plan Note (Addendum)
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 Stage IV; DEC 8th CT lung- Progression Bil lung nodules [compared to Oct 2017]; discussed the rationale for continuing the opdivo. Will order CT today.   # Currently on OPDIVO; proceed with cycle # 9 today.Tolerating well without major side effects- slightl worsening of kidney function.  # hypothyroidism- subclinical  [Oct 2017-TSH 5];  thyroid profile repeat Normal. Monitor for now.    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 recommend more hydration.; NO NSAIDs. reeat labs in 1 week.   # skin lesion ?Stage  Melanoma s/p Excision [Dr.Graham; [place]]  # Thyoird nodule- s/p left Bx- negative. Follow up with Dr.Byrnett.  # follow up in 2 weeks/labs/ opdivo; CT scan.

## 2016-12-27 NOTE — Progress Notes (Signed)
[place] OFFICE PROGRESS NOTE  Patient Care Team: Dion Body, MD as PCP - General (Family Medicine) Jannet Mantis, MD (Dermatology) Robert Bellow, MD (General Surgery) Hollice Espy, MD as Consulting Physician (Urology)  No matching staging information was found for the patient.   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  male pleasant patient above history of  renal cell cancer status post left nephrectomy- With metastases to the lung- Is currently started on Opdivo status post cycle 8 Is here for follow-up.  Denies any headaches. Denies any tingling or numbness. Denies any nausea vomiting.  Denies any shortness of breath or cough.  Appetite is good. No chronic diarrhea.  He is fairly active. No chest pain. No pain anywhere else. Appetite is good. No weight loss. Weight gain.   REVIEW OF SYSTEMS:  A complete 10 point review of system is done which is negative except mentioned above/history of present illness.   PAST MEDICAL HISTORY :  Past Medical History:  Diagnosis Date  . CAD (coronary artery disease)   . Cancer ([place])    left arm  . Hypertension   . Kidney stone     PAST SURGICAL HISTORY :   Past Surgical History:  Procedure Laterality Date  . arm surgery Left    arm  . cardiac stents  2011   Angioplasty / Stenting Femoral-X2  . COLONOSCOPY   04/01/12  . CORONARY ANGIOPLASTY    . EXCISION MELANOMA WITH SENTINEL LYMPH NODE BIOPSY Right 01/24/2016   Procedure: EXCISION MELANOMA Right Shoulder;  Surgeon: Robert Bellow, MD;  Location: [facility] ORS;  Service: General;  Laterality: Right;  . LAPAROSCOPIC NEPHRECTOMY, HAND ASSISTED Left 04/24/2016   Procedure: HAND ASSISTED LAPAROSCOPIC NEPHRECTOMY;  Surgeon: Hollice Espy, MD;  Location: [facility] ORS;  Service: Urology;  Laterality: Left;  Marland Kitchen VIDEO ASSISTED THORACOSCOPY (VATS)/THOROCOTOMY Left 03/08/2016   Procedure: VIDEO ASSISTED THORACOSCOPY (VATS) with lung biopsy - Left ;  Surgeon: Robert Bellow, MD;  Location: [facility] ORS;  Service: General;  Laterality: Left;    FAMILY HISTORY :   Family History  Problem Relation Age of Onset  . Hodgkin's lymphoma Daughter 5  . Kidney cancer Neg Hx   . Kidney disease Neg Hx   . Prostate cancer Neg Hx     SOCIAL HISTORY:   Social History  Substance Use Topics  . Smoking status: Former Smoker    Types: Cigarettes    Start date: 01/18/1956    Quit date: 01/17/1969  . Smokeless tobacco: Never Used  . Alcohol use No     Comment: BEER OCC    ALLERGIES:  is allergic to lisinopril and other.  MEDICATIONS:  Current Outpatient Prescriptions  Medication Sig Dispense Refill  . aspirin EC 81 MG tablet Take 81 mg by mouth daily.    Marland Kitchen atorvastatin (LIPITOR) 40 MG tablet Take 40 mg by mouth  daily.    . clopidogrel (PLAVIX) 75 MG tablet Take 75 mg by mouth daily.    Marland Kitchen docusate sodium (COLACE) 100 MG capsule TAKE ONE CAPSULE BY MOUTH TWICE DAILY 60 capsule 3  . HYDROcodone-acetaminophen (NORCO/VICODIN) 5-325 MG tablet Take 1 tablet by mouth every 6 (six) hours as needed for pain.    Marland Kitchen losartan (COZAAR) 50 MG tablet Take 50 mg by mouth every morning.     . magic mouthwash w/lidocaine SOLN Take 5 mLs by mouth 4 (four) times daily. 480 mL 3  . MELATONIN PO Take 10 mg by mouth at bedtime.     . metoprolol succinate (TOPROL-XL) 50 MG 24 hr tablet Take 50 mg by  mouth every morning.     . nitroGLYCERIN (NITROSTAT) 0.4 MG SL tablet Place 0.4 mg under the tongue every 5 (five) minutes as needed.     . sertraline (ZOLOFT) 100 MG tablet Take by mouth. Takes one and one half tablets daily    . tamsulosin (FLOMAX) 0.4 MG CAPS capsule Take 0.4 mg by mouth every morning.      No current facility-administered medications for this visit.     PHYSICAL EXAMINATION: ECOG PERFORMANCE STATUS: 0 - Asymptomatic  BP 127/79 (BP Location: Left Arm, Patient Position: Sitting)   Pulse (!) 57   Temp (!) 95.8 F (35.4 C) (Tympanic)   Wt 222 lb 8 oz (100.9 kg)   BMI 29.36 kg/m   Filed Weights   12/27/16 0910  Weight: 222 lb 8 oz (100.9 kg)    GENERAL: Well-nourished well-developed; Alert, no distress and comfortable. Accompanied by his daughter. EYES: no pallor or icterus OROPHARYNX: no thrush or ulceration; NECK: supple, no masses felt LYMPH:  no palpable lymphadenopathy in the cervical, axillary or inguinal regions LUNGS: clear to auscultation and  No wheeze or crackles HEART/CVS: regular rate & rhythm and no murmurs; No lower extremity edema ABDOMEN:abdomen soft, non-tender and normal bowel sounds Musculoskeletal:no cyanosis of digits and no clubbing  PSYCH: alert & oriented x 3 with fluent speech NEURO: no focal motor/sensory deficits SKIN:  no rashes or significant lesions; nephrectomy incisions healing well.  LABORATORY DATA:  I have reviewed the data as listed    Component Value Date/Time   NA 136 12/27/2016 0850   NA 146 (H) 05/25/2016 1514   K 4.1 12/27/2016 0850   CL 103 12/27/2016 0850   CO2 27 12/27/2016 0850   GLUCOSE 132 (H) 12/27/2016 0850   BUN 19 12/27/2016 0850   BUN 19 05/25/2016 1514   CREATININE 1.95 (H) 12/27/2016 0850   CALCIUM 9.0 12/27/2016 0850   PROT 7.0 12/27/2016 0850   ALBUMIN 4.0 12/27/2016 0850   AST 29 12/27/2016 0850   ALT 24 12/27/2016 0850   ALKPHOS 74 12/27/2016 0850   BILITOT 0.6 12/27/2016 0850    GFRNONAA 32 (L) 12/27/2016 0850   GFRAA 37 (L) 12/27/2016 0850    No results found for: SPEP, UPEP  Lab Results  Component Value Date   WBC 6.2 12/27/2016   NEUTROABS 4.4 12/27/2016   HGB 12.6 (L) 12/27/2016   HCT 37.0 (L) 12/27/2016   MCV 80.7 12/27/2016   PLT 141 (L) 12/27/2016      Chemistry      Component Value Date/Time   NA 136 12/27/2016 0850   NA 146 (H) 05/25/2016 1514   K 4.1 12/27/2016 0850   CL 103 12/27/2016 0850   CO2 27 12/27/2016 0850   BUN 19 12/27/2016 0850  BUN 19 05/25/2016 1514   CREATININE 1.95 (H) 12/27/2016 0850      Component Value Date/Time   CALCIUM 9.0 12/27/2016 0850   ALKPHOS 74 12/27/2016 0850   AST 29 12/27/2016 0850   ALT 24 12/27/2016 0850   BILITOT 0.6 12/27/2016 0850     RADIOGRAPHIC STUDIES: I have personally reviewed the radiological images as listed and agreed with the findings in the report. No results found.  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 Stage IV; DEC 8th CT lung- Progression Bil lung nodules [compared to Oct 2017]; discussed the rationale for continuing the opdivo. Will order CT today.   # Currently on OPDIVO; proceed with cycle # 9 today.Tolerating well without major side effects- slightl worsening of kidney function.  # hypothyroidism- subclinical  [Oct 2017-TSH 5];  thyroid profile repeat Normal. Monitor for now.    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 recommend more hydration.; NO NSAIDs. reeat labs in 1 week.   # skin lesion ?Stage  Melanoma s/p Excision [Dr.Graham; [place]]  # Thyoird nodule- s/p left Bx- negative. Follow up with Dr.Byrnett.  # follow up in 2 weeks/labs/ opdivo; CT scan.   Orders Placed This Encounter  Procedures  . CT CHEST WO CONTRAST    Standing Status:   Future    Standing Expiration Date:   02/26/2018    Order Specific Question:   Reason for  Exam (SYMPTOM  OR DIAGNOSIS REQUIRED)    Answer:   kidncy cancer    Order Specific Question:   Preferred imaging location?    Answer:   [facility]  . CT ABDOMEN PELVIS WO CONTRAST    Standing Status:   Future    Standing Expiration Date:   03/28/2018    Order Specific Question:   Reason for Exam (SYMPTOM  OR DIAGNOSIS REQUIRED)    Answer:   kidncy cancer    Order Specific Question:   Preferred [facility] location?    Answer[facility]  . Basic metabolic panel    Standing Status:   Future    Standing Expiration Date:   06/26/2017  . CBC with Differential/Platelet    Standing Status:   Future    Standing Expiration Date:   06/26/2017  . Comprehensive metabolic panel    Standing Status:   Future    Standing Expiration Date:   06/26/2017      Cammie Sickle, MD 12/28/2016 6:29 PM

## 2016-12-27 NOTE — Progress Notes (Signed)
Patient here today for follow up.  Patient states no new concerns today  

## 2017-01-03 ENCOUNTER — Inpatient Hospital Stay: Payer: PPO

## 2017-01-03 DIAGNOSIS — C642 Malignant neoplasm of left kidney, except renal pelvis: Secondary | ICD-10-CM

## 2017-01-03 DIAGNOSIS — Z5112 Encounter for antineoplastic immunotherapy: Secondary | ICD-10-CM | POA: Diagnosis not present

## 2017-01-03 LAB — COMPREHENSIVE METABOLIC PANEL
ALBUMIN: 4.2 g/dL (ref 3.5–5.0)
ALT: 25 U/L (ref 17–63)
ANION GAP: 7 (ref 5–15)
AST: 28 U/L (ref 15–41)
Alkaline Phosphatase: 76 U/L (ref 38–126)
BILIRUBIN TOTAL: 0.7 mg/dL (ref 0.3–1.2)
BUN: 18 mg/dL (ref 6–20)
CHLORIDE: 103 mmol/L (ref 101–111)
CO2: 24 mmol/L (ref 22–32)
Calcium: 9 mg/dL (ref 8.9–10.3)
Creatinine, Ser: 1.52 mg/dL — ABNORMAL HIGH (ref 0.61–1.24)
GFR calc Af Amer: 50 mL/min — ABNORMAL LOW (ref >60)
GFR, EST NON AFRICAN AMERICAN: 43 mL/min — AB (ref >60)
Glucose, Bld: 107 mg/dL — ABNORMAL HIGH (ref 65–99)
POTASSIUM: 3.9 mmol/L (ref 3.5–5.1)
Sodium: 134 mmol/L — ABNORMAL LOW (ref 135–145)
TOTAL PROTEIN: 7.4 g/dL (ref 6.5–8.1)

## 2017-01-03 LAB — CBC WITH DIFFERENTIAL/PLATELET
BASOS ABS: 0 10*3/uL (ref 0–0.1)
BASOS PCT: 1 %
EOS PCT: 3 %
Eosinophils Absolute: 0.2 10*3/uL (ref 0–0.7)
HEMATOCRIT: 37.1 % — AB (ref 40.0–52.0)
Hemoglobin: 12.9 g/dL — ABNORMAL LOW (ref 13.0–18.0)
Lymphocytes Relative: 19 %
Lymphs Abs: 1.2 10*3/uL (ref 1.0–3.6)
MCH: 27.7 pg (ref 26.0–34.0)
MCHC: 34.8 g/dL (ref 32.0–36.0)
MCV: 79.7 fL — AB (ref 80.0–100.0)
MONO ABS: 0.7 10*3/uL (ref 0.2–1.0)
Monocytes Relative: 11 %
NEUTROS ABS: 4.1 10*3/uL (ref 1.4–6.5)
Neutrophils Relative %: 66 %
PLATELETS: 154 10*3/uL (ref 150–440)
RBC: 4.66 MIL/uL (ref 4.40–5.90)
RDW: 15.5 % — AB (ref 11.5–14.5)
WBC: 6.2 10*3/uL (ref 3.8–10.6)

## 2017-01-08 ENCOUNTER — Ambulatory Visit
Admission: RE | Admit: 2017-01-08 | Discharge: 2017-01-08 | Disposition: A | Discharge status: Home / Self Care | Payer: PPO | Source: Ambulatory Visit | Attending: Internal Medicine | Admitting: Internal Medicine

## 2017-01-08 DIAGNOSIS — C7800 Secondary malignant neoplasm of unspecified lung: Secondary | ICD-10-CM | POA: Diagnosis not present

## 2017-01-08 DIAGNOSIS — C7801 Secondary malignant neoplasm of right lung: Secondary | ICD-10-CM | POA: Diagnosis not present

## 2017-01-08 DIAGNOSIS — C642 Malignant neoplasm of left kidney, except renal pelvis: Secondary | ICD-10-CM | POA: Insufficient documentation

## 2017-01-08 DIAGNOSIS — C649 Malignant neoplasm of unspecified kidney, except renal pelvis: Secondary | ICD-10-CM | POA: Diagnosis not present

## 2017-01-08 DIAGNOSIS — R161 Splenomegaly, not elsewhere classified: Secondary | ICD-10-CM | POA: Diagnosis not present

## 2017-01-08 DIAGNOSIS — R911 Solitary pulmonary nodule: Secondary | ICD-10-CM | POA: Diagnosis not present

## 2017-01-08 DIAGNOSIS — Z905 Acquired absence of kidney: Secondary | ICD-10-CM | POA: Diagnosis not present

## 2017-01-08 DIAGNOSIS — C7802 Secondary malignant neoplasm of left lung: Secondary | ICD-10-CM | POA: Diagnosis not present

## 2017-01-10 ENCOUNTER — Inpatient Hospital Stay (HOSPITAL_BASED_OUTPATIENT_CLINIC_OR_DEPARTMENT_OTHER): Payer: PPO | Admitting: Internal Medicine

## 2017-01-10 ENCOUNTER — Inpatient Hospital Stay: Payer: PPO

## 2017-01-10 VITALS — BP 151/81 | HR 69 | Temp 97.8°F | Resp 20 | Ht 73.0 in | Wt 220.0 lb

## 2017-01-10 DIAGNOSIS — Z87442 Personal history of urinary calculi: Secondary | ICD-10-CM | POA: Diagnosis not present

## 2017-01-10 DIAGNOSIS — I2510 Atherosclerotic heart disease of native coronary artery without angina pectoris: Secondary | ICD-10-CM | POA: Diagnosis not present

## 2017-01-10 DIAGNOSIS — C642 Malignant neoplasm of left kidney, except renal pelvis: Secondary | ICD-10-CM

## 2017-01-10 DIAGNOSIS — Z79899 Other long term (current) drug therapy: Secondary | ICD-10-CM

## 2017-01-10 DIAGNOSIS — Z807 Family history of other malignant neoplasms of lymphoid, hematopoietic and related tissues: Secondary | ICD-10-CM | POA: Diagnosis not present

## 2017-01-10 DIAGNOSIS — C7800 Secondary malignant neoplasm of unspecified lung: Secondary | ICD-10-CM

## 2017-01-10 DIAGNOSIS — E039 Hypothyroidism, unspecified: Secondary | ICD-10-CM

## 2017-01-10 DIAGNOSIS — Z7982 Long term (current) use of aspirin: Secondary | ICD-10-CM

## 2017-01-10 DIAGNOSIS — I129 Hypertensive chronic kidney disease with stage 1 through stage 4 chronic kidney disease, or unspecified chronic kidney disease: Secondary | ICD-10-CM | POA: Diagnosis not present

## 2017-01-10 DIAGNOSIS — Z5112 Encounter for antineoplastic immunotherapy: Secondary | ICD-10-CM | POA: Diagnosis not present

## 2017-01-10 DIAGNOSIS — Z87891 Personal history of nicotine dependence: Secondary | ICD-10-CM

## 2017-01-10 DIAGNOSIS — Z85828 Personal history of other malignant neoplasm of skin: Secondary | ICD-10-CM | POA: Diagnosis not present

## 2017-01-10 LAB — CBC WITH DIFFERENTIAL/PLATELET
BASOS ABS: 0 10*3/uL (ref 0–0.1)
BASOS PCT: 1 %
EOS ABS: 0.2 10*3/uL (ref 0–0.7)
EOS PCT: 4 %
HCT: 36.6 % — ABNORMAL LOW (ref 40.0–52.0)
Hemoglobin: 13 g/dL (ref 13.0–18.0)
Lymphocytes Relative: 18 %
Lymphs Abs: 1.2 10*3/uL (ref 1.0–3.6)
MCH: 28 pg (ref 26.0–34.0)
MCHC: 35.4 g/dL (ref 32.0–36.0)
MCV: 79.1 fL — ABNORMAL LOW (ref 80.0–100.0)
MONO ABS: 0.6 10*3/uL (ref 0.2–1.0)
Monocytes Relative: 10 %
Neutro Abs: 4.2 10*3/uL (ref 1.4–6.5)
Neutrophils Relative %: 67 %
PLATELETS: 151 10*3/uL (ref 150–440)
RBC: 4.63 MIL/uL (ref 4.40–5.90)
RDW: 16.2 % — AB (ref 11.5–14.5)
WBC: 6.3 10*3/uL (ref 3.8–10.6)

## 2017-01-10 LAB — COMPREHENSIVE METABOLIC PANEL
ALBUMIN: 4.2 g/dL (ref 3.5–5.0)
ALT: 21 U/L (ref 17–63)
AST: 29 U/L (ref 15–41)
Alkaline Phosphatase: 78 U/L (ref 38–126)
Anion gap: 7 (ref 5–15)
BUN: 18 mg/dL (ref 6–20)
CHLORIDE: 103 mmol/L (ref 101–111)
CO2: 25 mmol/L (ref 22–32)
Calcium: 9 mg/dL (ref 8.9–10.3)
Creatinine, Ser: 1.57 mg/dL — ABNORMAL HIGH (ref 0.61–1.24)
GFR calc Af Amer: 48 mL/min — ABNORMAL LOW (ref >60)
GFR, EST NON AFRICAN AMERICAN: 41 mL/min — AB (ref >60)
GLUCOSE: 127 mg/dL — AB (ref 65–99)
POTASSIUM: 4 mmol/L (ref 3.5–5.1)
SODIUM: 135 mmol/L (ref 135–145)
Total Bilirubin: 0.6 mg/dL (ref 0.3–1.2)
Total Protein: 7.2 g/dL (ref 6.5–8.1)

## 2017-01-10 MED ORDER — NIVOLUMAB CHEMO INJECTION 100 MG/10ML
240.0000 mg | Freq: Once | INTRAVENOUS | Status: DC
  Filled 2017-01-10: qty 24

## 2017-01-10 MED ORDER — SODIUM CHLORIDE 0.9 % IV SOLN
Freq: Once | INTRAVENOUS | Status: AC
  Administered 2017-01-10: 11:00:00 via INTRAVENOUS
  Filled 2017-01-10: qty 1000

## 2017-01-10 MED ORDER — SODIUM CHLORIDE 0.9 % IV SOLN
240.0000 mg | Freq: Once | INTRAVENOUS | Status: AC
  Administered 2017-01-10: 240 mg via INTRAVENOUS
  Filled 2017-01-10: qty 24

## 2017-01-10 NOTE — Assessment & Plan Note (Addendum)
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 Stage IV; FEB 20th CT lung- slight Progression Bil lung nodules [compared to Oct 2017]; discussed the rationale for continuing the opdivo.    # Currently on OPDIVO; proceed with cycle # 10 today.Tolerating well without major side effects- slightlly worsening of kidney function.  # hypothyroidism- subclinical  [Oct 2017-TSH 5];  thyroid profile repeat Normal. Monitor for now.    # CKD creat- 1.57 /solitary kidney; monitor for now. recommend more hydration.; NO NSAIDs.  # Thyoird nodule- s/p left Bx- negative. Follow up with Dr.Byrnett.  # follow up in 2 weeks/labs/ opdivo.

## 2017-01-10 NOTE — Progress Notes (Signed)
Creatinine: 1.57. MD, Dr. Rogue Bussing, notified via telephone and already aware. Per MD order: proceed with scheduled treatment today.

## 2017-01-11 NOTE — Progress Notes (Signed)
[place] OFFICE PROGRESS NOTE  Patient Care Team: Dion Body, MD as PCP - General (Family Medicine) Jannet Mantis, MD (Dermatology) Robert Bellow, MD (General Surgery) Hollice Espy, MD as Consulting Physician (Urology)  No matching staging information was found for the patient.   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  Denies any headaches. Denies any tingling or numbness. Denies any nausea vomiting.  Denies any shortness of breath or cough.  Appetite is good. No chronic diarrhea.  He is fairly active. No chest pain. No pain anywhere else. Appetite is good. No weight loss. Weight gain.   REVIEW OF SYSTEMS:  A complete 10 point review of system is done which is negative except mentioned above/history of present illness.   PAST MEDICAL HISTORY :  Past Medical History:  Diagnosis Date  . CAD (coronary artery disease)   . Cancer ([place])    left arm  . Hypertension   . Kidney stone     PAST SURGICAL HISTORY :   Past Surgical History:  Procedure Laterality Date  . arm surgery Left    arm  . cardiac stents  2011   Angioplasty / Stenting Femoral-X2  . COLONOSCOPY   04/01/12  . CORONARY ANGIOPLASTY    . EXCISION MELANOMA WITH SENTINEL LYMPH NODE BIOPSY Right 01/24/2016   Procedure: EXCISION MELANOMA Right Shoulder;  Surgeon: Robert Bellow, MD;  Location: [facility] ORS;  Service: General;  Laterality: Right;  . LAPAROSCOPIC NEPHRECTOMY, HAND ASSISTED Left 04/24/2016   Procedure: HAND ASSISTED LAPAROSCOPIC NEPHRECTOMY;  Surgeon: Hollice Espy, MD;  Location: [facility] ORS;  Service: Urology;  Laterality: Left;  Marland Kitchen VIDEO ASSISTED THORACOSCOPY (VATS)/THOROCOTOMY Left 03/08/2016   Procedure: VIDEO ASSISTED THORACOSCOPY (VATS) with lung biopsy - Left ;  Surgeon: Robert Bellow, MD;  Location: [facility] ORS;  Service: General;  Laterality: Left;    FAMILY HISTORY :   Family History  Problem Relation Age of Onset  . Hodgkin's lymphoma Daughter 5  . Kidney cancer Neg Hx   . Kidney disease Neg Hx   . Prostate cancer Neg Hx     SOCIAL HISTORY:   Social History  Substance Use Topics  . Smoking status: Former Smoker    Types: Cigarettes    Start date: 01/18/1956    Quit date: 01/17/1969  . Smokeless tobacco: Never Used  . Alcohol use No     Comment: BEER OCC    ALLERGIES:  is allergic to lisinopril and other.  MEDICATIONS:  Current Outpatient Prescriptions  Medication Sig Dispense Refill  . aspirin EC 81 MG tablet Take 81 mg by mouth daily.    Marland Kitchen atorvastatin (LIPITOR) 40 MG tablet Take 40 mg by mouth  daily.    . clopidogrel (PLAVIX) 75 MG tablet Take 75 mg by mouth daily.    Marland Kitchen docusate sodium (COLACE) 100 MG capsule TAKE ONE CAPSULE BY MOUTH TWICE DAILY 60 capsule 3  . HYDROcodone-acetaminophen (NORCO/VICODIN) 5-325 MG tablet Take 1 tablet by mouth every 6 (six) hours as needed for pain.    Marland Kitchen losartan (COZAAR) 50 MG tablet Take 50 mg by mouth every morning.     Marland Kitchen MELATONIN PO Take 10 mg by mouth at bedtime.     . metoprolol succinate (TOPROL-XL) 50 MG 24 hr tablet Take 50 mg by mouth every morning.     . sertraline (ZOLOFT) 100 MG tablet Take by mouth. Takes one  and one half tablets daily    . tamsulosin (FLOMAX) 0.4 MG CAPS capsule Take 0.4 mg by mouth every morning.     . magic mouthwash w/lidocaine SOLN Take 5 mLs by mouth 4 (four) times daily. (Patient not taking: Reported on 01/10/2017) 480 mL 3  . nitroGLYCERIN (NITROSTAT) 0.4 MG SL tablet Place 0.4 mg under the tongue every 5 (five) minutes as needed.      No current facility-administered medications for this visit.     PHYSICAL EXAMINATION: ECOG PERFORMANCE STATUS: 0 - Asymptomatic  BP (!) 151/81 (BP Location: Left Arm, Patient Position: Sitting)   Pulse 69   Temp 97.8 F (36.6 C) (Tympanic)   Resp 20   Ht '6\' 1"'$  (1.854 m)   Wt 220 lb (99.8 kg)   BMI 29.03 kg/m   Filed Weights   01/10/17 0919  Weight: 220 lb (99.8 kg)    GENERAL: Well-nourished well-developed; Alert, no distress and comfortable. Accompanied by his daughter. EYES: no pallor or icterus OROPHARYNX: no thrush or ulceration; NECK: supple, no masses felt LYMPH:  no palpable lymphadenopathy in the cervical, axillary or inguinal regions LUNGS: clear to auscultation and  No wheeze or crackles HEART/CVS: regular rate & rhythm and no murmurs; No lower extremity edema ABDOMEN:abdomen soft, non-tender and normal bowel sounds Musculoskeletal:no cyanosis of digits and no clubbing  PSYCH: alert & oriented x 3 with fluent speech NEURO: no focal motor/sensory deficits SKIN:  no rashes or significant lesions; nephrectomy incisions healing well.  LABORATORY DATA:  I have reviewed the data as listed    Component Value Date/Time   NA 135 01/10/2017 0850   NA 146 (H) 05/25/2016 1514   K 4.0 01/10/2017 0850   CL 103 01/10/2017 0850   CO2 25 01/10/2017 0850   GLUCOSE 127 (H) 01/10/2017 0850   BUN 18 01/10/2017 0850   BUN 19 05/25/2016 1514   CREATININE 1.57 (H) 01/10/2017 0850   CALCIUM 9.0 01/10/2017 0850   PROT 7.2 01/10/2017 0850   ALBUMIN 4.2 01/10/2017 0850   AST 29 01/10/2017 0850   ALT 21 01/10/2017 0850    ALKPHOS 78 01/10/2017 0850   BILITOT 0.6 01/10/2017 0850   GFRNONAA 41 (L) 01/10/2017 0850   GFRAA 48 (L) 01/10/2017 0850    No results found for: SPEP, UPEP  Lab Results  Component Value Date   WBC 6.3 01/10/2017   NEUTROABS 4.2 01/10/2017   HGB 13.0 01/10/2017   HCT 36.6 (L) 01/10/2017   MCV 79.1 (L) 01/10/2017   PLT 151 01/10/2017      Chemistry      Component Value Date/Time   NA 135 01/10/2017 0850   NA 146 (H) 05/25/2016 1514   K 4.0 01/10/2017 0850   CL 103 01/10/2017 0850   CO2  25 01/10/2017 0850   BUN 18 01/10/2017 0850   BUN 19 05/25/2016 1514   CREATININE 1.57 (H) 01/10/2017 0850      Component Value Date/Time   CALCIUM 9.0 01/10/2017 0850   ALKPHOS 78 01/10/2017 0850   AST 29 01/10/2017 0850   ALT 21 01/10/2017 0850   BILITOT 0.6 01/10/2017 0850     RADIOGRAPHIC STUDIES: I have personally reviewed the radiological images as listed and agreed with the findings in the report. No results found.  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 Stage IV; DEC 8th CT lung- Progression Bil lung nodules [compared to Oct 2017]; discussed the rationale for continuing the opdivo. Will order CT today.   # Currently on OPDIVO; proceed with cycle # 9 today.Tolerating well without major side effects- slightl worsening of kidney function.  # hypothyroidism- subclinical  [Oct 2017-TSH 5];  thyroid profile repeat Normal. Monitor for now.    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 recommend more hydration.; NO NSAIDs. reeat labs in 1 week.   # skin lesion ?Stage  Melanoma s/p Excision [Dr.Graham; [place]]  # Thyoird nodule- s/p left Bx- negative. Follow up with Dr.Byrnett.  # follow up in 2 weeks/labs/ opdivo; CT scan. ..  No orders of the defined types were placed in this encounter.     Cammie Sickle, MD 01/11/2017 1:58 PM

## 2017-01-24 ENCOUNTER — Inpatient Hospital Stay: Payer: PPO | Attending: Internal Medicine | Admitting: Internal Medicine

## 2017-01-24 ENCOUNTER — Inpatient Hospital Stay: Payer: PPO

## 2017-01-24 VITALS — BP 155/78 | HR 58 | Temp 97.6°F | Resp 20 | Ht 73.0 in | Wt 225.0 lb

## 2017-01-24 DIAGNOSIS — I129 Hypertensive chronic kidney disease with stage 1 through stage 4 chronic kidney disease, or unspecified chronic kidney disease: Secondary | ICD-10-CM | POA: Insufficient documentation

## 2017-01-24 DIAGNOSIS — N189 Chronic kidney disease, unspecified: Secondary | ICD-10-CM | POA: Insufficient documentation

## 2017-01-24 DIAGNOSIS — E039 Hypothyroidism, unspecified: Secondary | ICD-10-CM | POA: Insufficient documentation

## 2017-01-24 DIAGNOSIS — Z85820 Personal history of malignant melanoma of skin: Secondary | ICD-10-CM | POA: Insufficient documentation

## 2017-01-24 DIAGNOSIS — Z79899 Other long term (current) drug therapy: Secondary | ICD-10-CM | POA: Diagnosis not present

## 2017-01-24 DIAGNOSIS — Z87442 Personal history of urinary calculi: Secondary | ICD-10-CM | POA: Diagnosis not present

## 2017-01-24 DIAGNOSIS — R51 Headache: Secondary | ICD-10-CM | POA: Insufficient documentation

## 2017-01-24 DIAGNOSIS — Z87891 Personal history of nicotine dependence: Secondary | ICD-10-CM | POA: Diagnosis not present

## 2017-01-24 DIAGNOSIS — C642 Malignant neoplasm of left kidney, except renal pelvis: Secondary | ICD-10-CM | POA: Insufficient documentation

## 2017-01-24 DIAGNOSIS — C7800 Secondary malignant neoplasm of unspecified lung: Secondary | ICD-10-CM | POA: Insufficient documentation

## 2017-01-24 DIAGNOSIS — I2510 Atherosclerotic heart disease of native coronary artery without angina pectoris: Secondary | ICD-10-CM | POA: Insufficient documentation

## 2017-01-24 DIAGNOSIS — Z5112 Encounter for antineoplastic immunotherapy: Secondary | ICD-10-CM | POA: Diagnosis not present

## 2017-01-24 LAB — COMPREHENSIVE METABOLIC PANEL
ALK PHOS: 81 U/L (ref 38–126)
ALT: 26 U/L (ref 17–63)
AST: 28 U/L (ref 15–41)
Albumin: 4.4 g/dL (ref 3.5–5.0)
Anion gap: 8 (ref 5–15)
BUN: 17 mg/dL (ref 6–20)
CO2: 28 mmol/L (ref 22–32)
Calcium: 9.4 mg/dL (ref 8.9–10.3)
Chloride: 100 mmol/L — ABNORMAL LOW (ref 101–111)
Creatinine, Ser: 1.47 mg/dL — ABNORMAL HIGH (ref 0.61–1.24)
GFR calc Af Amer: 52 mL/min — ABNORMAL LOW (ref >60)
GFR, EST NON AFRICAN AMERICAN: 45 mL/min — AB (ref >60)
GLUCOSE: 193 mg/dL — AB (ref 65–99)
Potassium: 4 mmol/L (ref 3.5–5.1)
SODIUM: 136 mmol/L (ref 135–145)
TOTAL PROTEIN: 7.4 g/dL (ref 6.5–8.1)
Total Bilirubin: 0.6 mg/dL (ref 0.3–1.2)

## 2017-01-24 LAB — CBC WITH DIFFERENTIAL/PLATELET
BASOS ABS: 0 10*3/uL (ref 0–0.1)
Basophils Relative: 1 %
EOS PCT: 4 %
Eosinophils Absolute: 0.2 10*3/uL (ref 0–0.7)
HCT: 38.8 % — ABNORMAL LOW (ref 40.0–52.0)
HEMOGLOBIN: 13.3 g/dL (ref 13.0–18.0)
LYMPHS ABS: 1.2 10*3/uL (ref 1.0–3.6)
LYMPHS PCT: 20 %
MCH: 27.1 pg (ref 26.0–34.0)
MCHC: 34.2 g/dL (ref 32.0–36.0)
MCV: 79.3 fL — AB (ref 80.0–100.0)
Monocytes Absolute: 0.4 10*3/uL (ref 0.2–1.0)
Monocytes Relative: 7 %
Neutro Abs: 4.1 10*3/uL (ref 1.4–6.5)
Neutrophils Relative %: 68 %
PLATELETS: 159 10*3/uL (ref 150–440)
RBC: 4.89 MIL/uL (ref 4.40–5.90)
RDW: 16.1 % — ABNORMAL HIGH (ref 11.5–14.5)
WBC: 5.9 10*3/uL (ref 3.8–10.6)

## 2017-01-24 MED ORDER — SODIUM CHLORIDE 0.9 % IV SOLN
Freq: Once | INTRAVENOUS | Status: AC
  Administered 2017-01-24: 09:00:00 via INTRAVENOUS
  Filled 2017-01-24: qty 1000

## 2017-01-24 MED ORDER — NIVOLUMAB CHEMO INJECTION 100 MG/10ML
240.0000 mg | Freq: Once | INTRAVENOUS | Status: AC
  Administered 2017-01-24: 240 mg via INTRAVENOUS
  Filled 2017-01-24: qty 24

## 2017-01-24 NOTE — Assessment & Plan Note (Addendum)
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 Stage IV; FEB 20th CT lung- slight Progression Bil lung nodules [compared to Oct 2017]; discussed the rationale for continuing the opdivo.    # Currently on OPDIVO; proceed with cycle # 11 today. Labs today reviewed;  acceptable for treatment today.   # hypothyroidism- subclinical  [Oct 2017-TSH 5];  thyroid profile repeat Normal. Monitor for now.    # Eyes tearing/allregies- ketotifen eye drops..  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   # Headaches- MRI brain- oct 2017-neg; ? allegires- recommend anti-histamine.  # follow up in 2 weeks/labs/ opdivo.

## 2017-01-24 NOTE — Progress Notes (Signed)
[place] OFFICE PROGRESS NOTE  Patient Care Team: Dion Body, MD as PCP - General (Family Medicine) Jannet Mantis, MD (Dermatology) Robert Bellow, MD (General Surgery) Hollice Espy, MD as Consulting Physician (Urology)  No matching staging information was found for the patient.   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  male pleasant patient above history of  renal cell cancer status post left nephrectomy- With metastases to the lung- Is currently started on Opdivo status post cycle 10 Is here for follow-up.  Patient complains tearing in his eyes. Denies any blurry vision. Denies any nausea vomiting.  Denies any shortness of breath or cough.  Appetite is good. No chronic diarrhea.  He is fairly active. No chest pain. No pain anywhere else. Appetite is good. No weight loss. Weight gain. He does complain of intermittent headaches; nasal stuffiness.    REVIEW OF SYSTEMS:  A complete 10 point review of system is done which is negative except mentioned above/history of present illness.   PAST MEDICAL HISTORY :  Past Medical History:  Diagnosis Date  . CAD (coronary artery disease)   . Cancer ([place])    left arm  . Hypertension   . Kidney stone     PAST SURGICAL HISTORY :   Past Surgical History:  Procedure Laterality Date  . arm surgery Left    arm  .  cardiac stents  2011   Angioplasty / Stenting Femoral-X2  . COLONOSCOPY  04/01/12  . CORONARY ANGIOPLASTY    . EXCISION MELANOMA WITH SENTINEL LYMPH NODE BIOPSY Right 01/24/2016   Procedure: EXCISION MELANOMA Right Shoulder;  Surgeon: Robert Bellow, MD;  Location: [facility] ORS;  Service: General;  Laterality: Right;  . LAPAROSCOPIC NEPHRECTOMY, HAND ASSISTED Left 04/24/2016   Procedure: HAND ASSISTED LAPAROSCOPIC NEPHRECTOMY;  Surgeon: Hollice Espy, MD;  Location: [facility] ORS;  Service: Urology;  Laterality: Left;  Marland Kitchen VIDEO ASSISTED THORACOSCOPY (VATS)/THOROCOTOMY Left 03/08/2016   Procedure: VIDEO ASSISTED THORACOSCOPY (VATS) with lung biopsy - Left ;  Surgeon: Robert Bellow, MD;  Location: [facility] ORS;  Service: General;  Laterality: Left;    FAMILY HISTORY :   Family History  Problem Relation Age of Onset  . Hodgkin's lymphoma Daughter 5  . Kidney cancer Neg Hx   . Kidney disease Neg Hx   . Prostate cancer Neg Hx     SOCIAL HISTORY:   Social History  Substance Use Topics  . Smoking status: Former Smoker    Types: Cigarettes    Start date: 01/18/1956    Quit date: 01/17/1969  . Smokeless tobacco: Never Used  . Alcohol use No     Comment: BEER OCC    ALLERGIES:  is allergic to lisinopril and other.  MEDICATIONS:  Current Outpatient Prescriptions  Medication Sig Dispense Refill  . acetaminophen (TYLENOL) 500 MG tablet Take 500 mg by mouth every 6 (six) hours  as needed for moderate pain.    Marland Kitchen aspirin EC 81 MG tablet Take 81 mg by mouth daily.    Marland Kitchen atorvastatin (LIPITOR) 40 MG tablet Take 40 mg by mouth daily.    . clopidogrel (PLAVIX) 75 MG tablet Take 75 mg by mouth daily.    Marland Kitchen docusate sodium (COLACE) 100 MG capsule TAKE ONE CAPSULE BY MOUTH TWICE DAILY 60 capsule 3  . losartan (COZAAR) 50 MG tablet Take 50 mg by mouth every morning.     Marland Kitchen MELATONIN PO Take 10 mg by mouth at bedtime.     . metoprolol succinate (TOPROL-XL) 50 MG 24 hr tablet Take 50 mg by mouth every morning.     .  sertraline (ZOLOFT) 100 MG tablet Take by mouth. Takes one and one half tablets daily    . tamsulosin (FLOMAX) 0.4 MG CAPS capsule Take 0.4 mg by mouth every morning.     Marland Kitchen HYDROcodone-acetaminophen (NORCO/VICODIN) 5-325 MG tablet Take 1 tablet by mouth every 6 (six) hours as needed for pain.    . magic mouthwash w/lidocaine SOLN Take 5 mLs by mouth 4 (four) times daily. (Patient not taking: Reported on 01/10/2017) 480 mL 3  . nitroGLYCERIN (NITROSTAT) 0.4 MG SL tablet Place 0.4 mg under the tongue every 5 (five) minutes as needed.      No current facility-administered medications for this visit.     PHYSICAL EXAMINATION: ECOG PERFORMANCE STATUS: 0 - Asymptomatic  BP (!) 155/78 (BP Location: Left Arm, Patient Position: Sitting)   Pulse (!) 58   Temp 97.6 F (36.4 C) (Tympanic)   Resp 20   Ht '6\' 1"'$  (1.854 m)   Wt 225 lb (102.1 kg)   BMI 29.69 kg/m   Filed Weights   01/24/17 0842  Weight: 225 lb (102.1 kg)    GENERAL: Well-nourished well-developed; Alert, no distress and comfortable. Accompanied by his daughter. EYES: no pallor or icterus OROPHARYNX: no thrush or ulceration; NECK: supple, no masses felt LYMPH:  no palpable lymphadenopathy in the cervical, axillary or inguinal regions LUNGS: clear to auscultation and  No wheeze or crackles HEART/CVS: regular rate & rhythm and no murmurs; No lower extremity edema ABDOMEN:abdomen soft, non-tender and normal bowel sounds Musculoskeletal:no cyanosis of digits and no clubbing  PSYCH: alert & oriented x 3 with fluent speech NEURO: no focal motor/sensory deficits SKIN:  no rashes or significant lesions; nephrectomy incisions healing well.  LABORATORY DATA:  I have reviewed the data as listed    Component Value Date/Time   NA 136 01/24/2017 0817   NA 146 (H) 05/25/2016 1514   K 4.0 01/24/2017 0817   CL 100 (L) 01/24/2017 0817   CO2 28 01/24/2017 0817   GLUCOSE 193 (H) 01/24/2017 0817   BUN 17 01/24/2017 0817   BUN 19 05/25/2016  1514   CREATININE 1.47 (H) 01/24/2017 0817   CALCIUM 9.4 01/24/2017 0817   PROT 7.4 01/24/2017 0817   ALBUMIN 4.4 01/24/2017 0817   AST 28 01/24/2017 0817   ALT 26 01/24/2017 0817   ALKPHOS 81 01/24/2017 0817   BILITOT 0.6 01/24/2017 0817   GFRNONAA 45 (L) 01/24/2017 0817   GFRAA 52 (L) 01/24/2017 0817    No results found for: SPEP, UPEP  Lab Results  Component Value Date   WBC 5.9 01/24/2017   NEUTROABS 4.1 01/24/2017   HGB 13.3 01/24/2017   HCT 38.8 (L) 01/24/2017   MCV 79.3 (L) 01/24/2017   PLT 159 01/24/2017      Chemistry  Component Value Date/Time   NA 136 01/24/2017 0817   NA 146 (H) 05/25/2016 1514   K 4.0 01/24/2017 0817   CL 100 (L) 01/24/2017 0817   CO2 28 01/24/2017 0817   BUN 17 01/24/2017 0817   BUN 19 05/25/2016 1514   CREATININE 1.47 (H) 01/24/2017 0817      Component Value Date/Time   CALCIUM 9.4 01/24/2017 0817   ALKPHOS 81 01/24/2017 0817   AST 28 01/24/2017 0817   ALT 26 01/24/2017 0817   BILITOT 0.6 01/24/2017 0817     RADIOGRAPHIC STUDIES: I have personally reviewed the radiological images as listed and agreed with the findings in the report. No results found.  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 Stage IV; FEB 20th CT lung- slight Progression Bil lung nodules [compared to Oct 2017]; discussed the rationale for continuing the opdivo.    # Currently on OPDIVO; proceed with cycle # 11 today. Labs today reviewed;  acceptable for treatment today.   # hypothyroidism- subclinical  [Oct 2017-TSH 5];  thyroid profile repeat Normal. Monitor for now.    # Eyes tearing/allregies- ketotifen eye drops..  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   # Headaches- MRI brain- oct 2017-neg; ? allegires- recommend anti-histamine.  # follow up in 2 weeks/labs/ opdivo.   No orders of the defined types were placed in this  encounter.     Cammie Sickle, MD 01/25/2017 1:41 PM

## 2017-01-24 NOTE — Progress Notes (Signed)
Patient here for follow-up for renal cancer and opdivo. He reports an intermittent headache relieved by 1 extra strength tylenol.

## 2017-01-25 DIAGNOSIS — R031 Nonspecific low blood-pressure reading: Secondary | ICD-10-CM | POA: Diagnosis not present

## 2017-01-28 DIAGNOSIS — R899 Unspecified abnormal finding in specimens from other organs, systems and tissues: Secondary | ICD-10-CM | POA: Diagnosis not present

## 2017-01-28 DIAGNOSIS — N289 Disorder of kidney and ureter, unspecified: Secondary | ICD-10-CM | POA: Diagnosis not present

## 2017-01-28 DIAGNOSIS — E781 Pure hyperglyceridemia: Secondary | ICD-10-CM | POA: Diagnosis not present

## 2017-01-28 DIAGNOSIS — I10 Essential (primary) hypertension: Secondary | ICD-10-CM | POA: Diagnosis not present

## 2017-01-28 DIAGNOSIS — F418 Other specified anxiety disorders: Secondary | ICD-10-CM | POA: Diagnosis not present

## 2017-01-29 ENCOUNTER — Other Ambulatory Visit: Payer: Self-pay | Admitting: Urology

## 2017-01-29 DIAGNOSIS — K5900 Constipation, unspecified: Secondary | ICD-10-CM

## 2017-01-30 ENCOUNTER — Telehealth: Payer: Self-pay | Admitting: *Deleted

## 2017-01-30 NOTE — Telephone Encounter (Signed)
Called to report that his wife has the flu and was asking for something to be called in to keep him from getting it. I advised that he call his PCP to ask about Tamaflu. He said he will call Dr Richarda Overlie

## 2017-02-05 ENCOUNTER — Encounter: Payer: Self-pay | Admitting: General Surgery

## 2017-02-05 ENCOUNTER — Ambulatory Visit (INDEPENDENT_AMBULATORY_CARE_PROVIDER_SITE_OTHER): Payer: PPO | Admitting: General Surgery

## 2017-02-05 ENCOUNTER — Inpatient Hospital Stay: Payer: Self-pay

## 2017-02-05 VITALS — BP 122/80 | HR 56 | Resp 16 | Ht 73.0 in | Wt 228.0 lb

## 2017-02-05 DIAGNOSIS — E041 Nontoxic single thyroid nodule: Secondary | ICD-10-CM | POA: Diagnosis not present

## 2017-02-05 DIAGNOSIS — J019 Acute sinusitis, unspecified: Secondary | ICD-10-CM | POA: Diagnosis not present

## 2017-02-05 MED ORDER — AMOXICILLIN-POT CLAVULANATE 875-125 MG PO TABS: 1.0000 | ORAL_TABLET | Freq: Two times a day (BID) | ORAL | 0 refills | Status: AC

## 2017-02-05 NOTE — Patient Instructions (Signed)
The patient is aware to call back for any questions or concerns.  

## 2017-02-05 NOTE — Progress Notes (Signed)
Patient ID: Glen Davis, male   DOB: [birth year], 76 y.o.   MRN: [id number]  Chief Complaint  Patient presents with  . Follow-up    thyroid nodule    HPI Glen Davis is a 76 y.o. male.  Here today for his one year follow up thyroid nodule with a history of lung cancer. Immunotherapy is every other week. The 02-02-16 FNA left thyroid nodule was benign. He denies any difficulty swallowing. He does admit to having head congestion/cough for a week. He is using OTC Mucinex. He admits to green nasal drainage.  He is here today with his wife, Glen Davis.    HPI  Past Medical History:  Diagnosis Date  . CAD (coronary artery disease)   . Cancer ([place])    left arm  . Hypertension   . Kidney stone   . Lung cancer ([place])   . Melanoma ([place]) 01/24/2016   right shoulder  . Thyroid nodule 02/02/2016   left BENIGN THYROID NODULE by FNA    Past Surgical History:  Procedure Laterality Date  . arm surgery Left    arm  . cardiac stents  2011   Angioplasty / Stenting Femoral-X2  . COLONOSCOPY  04/01/12  . CORONARY ANGIOPLASTY    . EXCISION MELANOMA WITH SENTINEL LYMPH NODE BIOPSY Right 01/24/2016   Procedure: EXCISION MELANOMA Right Shoulder;  Surgeon: Robert Bellow, MD;  Location: [facility] ORS;  Service: General;  Laterality: Right;  . LAPAROSCOPIC NEPHRECTOMY, HAND ASSISTED Left 04/24/2016   Procedure: HAND ASSISTED LAPAROSCOPIC NEPHRECTOMY;  Surgeon: Hollice Espy, MD;  Location: [facility] ORS;  Service: Urology;  Laterality: Left;  Marland Kitchen VIDEO ASSISTED THORACOSCOPY (VATS)/THOROCOTOMY Left 03/08/2016   Procedure: VIDEO ASSISTED THORACOSCOPY (VATS) with lung biopsy - Left ;  Surgeon: Robert Bellow, MD;  Location: [facility] ORS;  Service: General;  Laterality: Left;    Family History  Problem Relation Age of Onset  . Hodgkin's lymphoma Daughter 5  . Kidney cancer Neg Hx   . Kidney disease Neg Hx   . Prostate cancer Neg Hx     Social History Social History  Substance Use Topics  . Smoking status:  Former Smoker    Types: Cigarettes    Start date: 01/18/1956    Quit date: 01/17/1969  . Smokeless tobacco: Never Used  . Alcohol use No     Comment: BEER OCC    Allergies  Allergen Reactions  . Lisinopril Swelling  . Other Itching    Nitroglycerin Patch.    Current Outpatient Prescriptions  Medication Sig Dispense Refill  . acetaminophen (TYLENOL) 500 MG tablet Take 500 mg by mouth every 6 (six) hours as needed for moderate pain.    Marland Kitchen amLODipine (NORVASC) 5 MG tablet TAKE 1 TABLET (5 MG TOTAL) BY MOUTH ONCE DAILY.  0  . aspirin EC 81 MG tablet Take 81 mg by mouth daily.    Marland Kitchen atorvastatin (LIPITOR) 40 MG tablet Take 40 mg by mouth daily.    . clopidogrel (PLAVIX) 75 MG tablet Take 75 mg by mouth daily.    Marland Kitchen docusate sodium (COLACE) 100 MG capsule TAKE 1 CAPSULE BY MOUTH TWICE A DAY 60 capsule 3  . losartan (COZAAR) 50 MG tablet Take 50 mg by mouth every morning.     . magic mouthwash w/lidocaine SOLN Take 5 mLs by mouth 4 (four) times daily. 480 mL 3  . MELATONIN PO Take 10 mg by mouth at bedtime.     . metoprolol succinate (TOPROL-XL) 50 MG 24  hr tablet Take 50 mg by mouth every morning.     . nitroGLYCERIN (NITROSTAT) 0.4 MG SL tablet Place 0.4 mg under the tongue every 5 (five) minutes as needed.     . sertraline (ZOLOFT) 100 MG tablet Take by mouth. Takes one and one half tablets daily    . tamsulosin (FLOMAX) 0.4 MG CAPS capsule Take 0.4 mg by mouth every morning.     Marland Kitchen amoxicillin-clavulanate (AUGMENTIN) 875-125 MG tablet Take 1 tablet by mouth 2 (two) times daily. 17 tablet 0   No current facility-administered medications for this visit.     Review of Systems Review of Systems  Constitutional: Negative.   Respiratory: Positive for cough.   Cardiovascular: Negative.     Blood pressure 122/80, pulse (!) 56, resp. rate 16, height '6\' 1"'$  (1.854 m), weight 228 lb (103.4 kg).  Physical Exam Physical Exam  Constitutional: He is oriented to person, place, and time. He appears  well-developed and well-nourished.  HENT:  Head:    Mouth/Throat: Oropharynx is clear and moist.  Eyes: Conjunctivae are normal. No scleral icterus.  Neck: Neck supple. No tracheal deviation present. No thyromegaly present.  Cardiovascular: Normal rate, regular rhythm and normal heart sounds.   Pulmonary/Chest: Effort normal and breath sounds normal.      Lymphadenopathy:    He has no cervical adenopathy.    He has no axillary adenopathy.  Neurological: He is alert and oriented to person, place, and time.  Skin: Skin is warm and dry.  Right posterior shoulder melanoma incision site clean.  Psychiatric: His behavior is normal.    Data Reviewed Ultrasound examination of the thyroid was completed to reassess the previously biopsied nodule. In the left lobe a dominant nodule measuring 0.95 x 1.41 x 1.75 cm is identified. This is hypoechoic. This previously measured 1.8 x 1.9 cm.. No clear increased vascular flow within the lesion. This is in the lower pole the gland. Stable in size. Overall gland dimensions measuring 2.1 x 2.6 x 3.9 cm.   The right lobe measures 1.4 x 1.57 x 3.7 cm. Newly identified in the middle portion of the right lobe is a heterogeneous nodule measuring 0.78 x 1.0 x 1.06 cm. Flow was noted within the lesion. Previously this measured 0.8 x 0.8 x 0.85 cm without significant vascular flow.  Assessment    Stable left lobe nodule, mildly enlarged right lobe thyroid nodule.  No evidence of recurrent melanoma.  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  Sinusitis.    Plan    The patient will be placed on Augmentin 875 mg by mouth twice a day for 1 week.  We'll arrange for follow-up ultrasound in 1 year.     This information has been scribed by Karie Fetch RN, BSN,BC.   Robert Bellow 02/06/2017, 8:19 PM

## 2017-02-06 DIAGNOSIS — J019 Acute sinusitis, unspecified: Secondary | ICD-10-CM | POA: Insufficient documentation

## 2017-02-07 ENCOUNTER — Inpatient Hospital Stay (HOSPITAL_BASED_OUTPATIENT_CLINIC_OR_DEPARTMENT_OTHER): Payer: PPO | Admitting: Internal Medicine

## 2017-02-07 ENCOUNTER — Inpatient Hospital Stay: Payer: PPO

## 2017-02-07 VITALS — BP 129/61 | HR 56 | Temp 97.6°F | Resp 20 | Ht 73.0 in | Wt 227.4 lb

## 2017-02-07 DIAGNOSIS — Z87891 Personal history of nicotine dependence: Secondary | ICD-10-CM | POA: Diagnosis not present

## 2017-02-07 DIAGNOSIS — I2510 Atherosclerotic heart disease of native coronary artery without angina pectoris: Secondary | ICD-10-CM

## 2017-02-07 DIAGNOSIS — C642 Malignant neoplasm of left kidney, except renal pelvis: Secondary | ICD-10-CM

## 2017-02-07 DIAGNOSIS — E039 Hypothyroidism, unspecified: Secondary | ICD-10-CM | POA: Diagnosis not present

## 2017-02-07 DIAGNOSIS — Z85820 Personal history of malignant melanoma of skin: Secondary | ICD-10-CM | POA: Diagnosis not present

## 2017-02-07 DIAGNOSIS — Z87442 Personal history of urinary calculi: Secondary | ICD-10-CM | POA: Diagnosis not present

## 2017-02-07 DIAGNOSIS — I129 Hypertensive chronic kidney disease with stage 1 through stage 4 chronic kidney disease, or unspecified chronic kidney disease: Secondary | ICD-10-CM

## 2017-02-07 DIAGNOSIS — R5383 Other fatigue: Secondary | ICD-10-CM

## 2017-02-07 DIAGNOSIS — N189 Chronic kidney disease, unspecified: Secondary | ICD-10-CM

## 2017-02-07 DIAGNOSIS — Z79899 Other long term (current) drug therapy: Secondary | ICD-10-CM

## 2017-02-07 DIAGNOSIS — C7800 Secondary malignant neoplasm of unspecified lung: Secondary | ICD-10-CM

## 2017-02-07 DIAGNOSIS — R51 Headache: Secondary | ICD-10-CM

## 2017-02-07 DIAGNOSIS — Z5112 Encounter for antineoplastic immunotherapy: Secondary | ICD-10-CM | POA: Diagnosis not present

## 2017-02-07 LAB — COMPREHENSIVE METABOLIC PANEL
ALT: 26 U/L (ref 17–63)
AST: 30 U/L (ref 15–41)
Albumin: 4.1 g/dL (ref 3.5–5.0)
Alkaline Phosphatase: 70 U/L (ref 38–126)
Anion gap: 7 (ref 5–15)
BILIRUBIN TOTAL: 0.4 mg/dL (ref 0.3–1.2)
BUN: 14 mg/dL (ref 6–20)
CHLORIDE: 101 mmol/L (ref 101–111)
CO2: 26 mmol/L (ref 22–32)
Calcium: 9.1 mg/dL (ref 8.9–10.3)
Creatinine, Ser: 1.54 mg/dL — ABNORMAL HIGH (ref 0.61–1.24)
GFR, EST AFRICAN AMERICAN: 49 mL/min — AB (ref >60)
GFR, EST NON AFRICAN AMERICAN: 42 mL/min — AB (ref >60)
Glucose, Bld: 139 mg/dL — ABNORMAL HIGH (ref 65–99)
POTASSIUM: 3.9 mmol/L (ref 3.5–5.1)
Sodium: 134 mmol/L — ABNORMAL LOW (ref 135–145)
TOTAL PROTEIN: 7.2 g/dL (ref 6.5–8.1)

## 2017-02-07 LAB — CBC WITH DIFFERENTIAL/PLATELET
Basophils Absolute: 0 10*3/uL (ref 0–0.1)
Basophils Relative: 1 %
Eosinophils Absolute: 0.2 10*3/uL (ref 0–0.7)
Eosinophils Relative: 4 %
HEMATOCRIT: 36.8 % — AB (ref 40.0–52.0)
Hemoglobin: 12.7 g/dL — ABNORMAL LOW (ref 13.0–18.0)
LYMPHS ABS: 1 10*3/uL (ref 1.0–3.6)
LYMPHS PCT: 15 %
MCH: 27.1 pg (ref 26.0–34.0)
MCHC: 34.6 g/dL (ref 32.0–36.0)
MCV: 78.3 fL — AB (ref 80.0–100.0)
MONO ABS: 0.5 10*3/uL (ref 0.2–1.0)
MONOS PCT: 8 %
NEUTROS ABS: 5 10*3/uL (ref 1.4–6.5)
Neutrophils Relative %: 72 %
Platelets: 145 10*3/uL — ABNORMAL LOW (ref 150–440)
RBC: 4.69 MIL/uL (ref 4.40–5.90)
RDW: 16.5 % — AB (ref 11.5–14.5)
WBC: 6.8 10*3/uL (ref 3.8–10.6)

## 2017-02-07 MED ORDER — SODIUM CHLORIDE 0.9 % IV SOLN
Freq: Once | INTRAVENOUS | Status: AC
  Administered 2017-02-07: 11:00:00 via INTRAVENOUS
  Filled 2017-02-07: qty 1000

## 2017-02-07 MED ORDER — NIVOLUMAB CHEMO INJECTION 100 MG/10ML
240.0000 mg | Freq: Once | INTRAVENOUS | Status: AC
  Administered 2017-02-07: 240 mg via INTRAVENOUS
  Filled 2017-02-07: qty 20

## 2017-02-07 NOTE — Progress Notes (Signed)
[place] OFFICE PROGRESS NOTE  Patient Care Team: Dion Body, MD as PCP - General (Family Medicine) Jannet Mantis, MD (Dermatology) Robert Bellow, MD (General Surgery) Hollice Espy, MD as Consulting Physician (Urology)  No matching staging information was found for the patient.   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  He complains of mild fatigue. He is interested in starting any testosterone supplements. Appetite is good.  Denies any shortness of breath or cough. No chronic diarrhea.  He is fairly active. No chest pain. No pain anywhere else.No weight loss. Weight gain. Complaints of itchy eyes tearing. He has not used ophthalmic antihistamine as recommended.  REVIEW OF SYSTEMS:  A complete 10 point review of system is done which is negative except mentioned above/history of present illness.   PAST MEDICAL HISTORY :  Past Medical History:  Diagnosis Date  . CAD (coronary artery disease)   . Cancer ([place])    left arm  . Hypertension   . Kidney stone   . Lung cancer ([place])   . Melanoma ([place]) 01/24/2016   right shoulder  . Thyroid nodule 02/02/2016   left BENIGN  THYROID NODULE by FNA    PAST SURGICAL HISTORY :   Past Surgical History:  Procedure Laterality Date  . arm surgery Left    arm  . cardiac stents  2011   Angioplasty / Stenting Femoral-X2  . COLONOSCOPY  04/01/12  . CORONARY ANGIOPLASTY    . EXCISION MELANOMA WITH SENTINEL LYMPH NODE BIOPSY Right 01/24/2016   Procedure: EXCISION MELANOMA Right Shoulder;  Surgeon: Robert Bellow, MD;  Location: [facility] ORS;  Service: General;  Laterality: Right;  . LAPAROSCOPIC NEPHRECTOMY, HAND ASSISTED Left 04/24/2016   Procedure: HAND ASSISTED LAPAROSCOPIC NEPHRECTOMY;  Surgeon: Hollice Espy, MD;  Location: [facility] ORS;  Service: Urology;  Laterality: Left;  Marland Kitchen VIDEO ASSISTED THORACOSCOPY (VATS)/THOROCOTOMY Left 03/08/2016   Procedure: VIDEO ASSISTED THORACOSCOPY (VATS) with lung biopsy - Left ;  Surgeon: Robert Bellow, MD;  Location: [facility] ORS;  Service: General;  Laterality: Left;    FAMILY HISTORY :   Family History  Problem Relation Age of Onset  . Hodgkin's lymphoma Daughter 5  . Kidney cancer Neg Hx   . Kidney disease Neg Hx   . Prostate cancer Neg Hx     SOCIAL HISTORY:   Social History  Substance Use Topics  . Smoking status: Former Smoker    Types: Cigarettes    Start date: 01/18/1956    Quit date: 01/17/1969  . Smokeless tobacco: Never Used  . Alcohol use No     Comment: BEER OCC    ALLERGIES:  is allergic to lisinopril and other.  MEDICATIONS:  Current Outpatient Prescriptions  Medication Sig Dispense Refill  . amLODipine (NORVASC) 5 MG tablet TAKE 1 TABLET (5 MG TOTAL) BY MOUTH ONCE DAILY.  0  . amoxicillin-clavulanate (AUGMENTIN) 875-125 MG tablet Take 1 tablet by mouth 2 (two) times daily. 17 tablet 0  . aspirin EC 81 MG tablet Take 81 mg by mouth daily.    Marland Kitchen atorvastatin (LIPITOR) 40 MG tablet Take 40 mg by mouth daily.    . clopidogrel (PLAVIX) 75 MG tablet Take 75 mg by mouth daily.    Marland Kitchen docusate sodium (COLACE) 100 MG capsule TAKE 1 CAPSULE BY MOUTH TWICE A DAY 60 capsule  3  . losartan (COZAAR) 50 MG tablet Take 50 mg by mouth every morning.     Marland Kitchen MELATONIN PO Take 10 mg by mouth at bedtime.     . metoprolol succinate (TOPROL-XL) 50 MG 24 hr tablet Take 50 mg by mouth every morning.     . sertraline (ZOLOFT) 100 MG tablet Take by mouth. Takes one and one half tablets daily    . tamsulosin (FLOMAX) 0.4 MG CAPS capsule Take 0.4 mg by mouth every morning.     Marland Kitchen acetaminophen (TYLENOL) 500 MG tablet Take 500 mg by mouth every 6 (six) hours as needed for moderate pain.    . magic mouthwash w/lidocaine SOLN Take 5 mLs by mouth 4 (four) times daily. (Patient not taking: Reported on 02/07/2017) 480 mL 3  . nitroGLYCERIN (NITROSTAT) 0.4 MG SL tablet Place 0.4 mg under the tongue every 5 (five) minutes as needed.      No current facility-administered medications for this visit.     PHYSICAL EXAMINATION: ECOG PERFORMANCE STATUS: 0 - Asymptomatic  BP 129/61 (BP Location: Left Arm, Patient Position: Sitting)   Pulse (!) 56   Temp 97.6 F (36.4 C) (Tympanic)   Resp 20   Ht '6\' 1"'$  (1.854 m)   Wt 227 lb 6.4 oz (103.1 kg)   BMI 30.00 kg/m   Filed Weights   02/07/17 1033  Weight: 227 lb 6.4 oz (103.1 kg)    GENERAL: Well-nourished well-developed; Alert, no distress and comfortable. Accompanied by his daughter. EYES: no pallor or icterus OROPHARYNX: no thrush or ulceration; NECK: supple, no masses felt LYMPH:  no palpable lymphadenopathy in the cervical, axillary or inguinal regions LUNGS: clear to auscultation and  No wheeze or crackles HEART/CVS: regular rate & rhythm and no murmurs; No lower extremity edema ABDOMEN:abdomen soft, non-tender and normal bowel sounds Musculoskeletal:no cyanosis of digits and no clubbing  PSYCH: alert & oriented x 3 with fluent speech NEURO: no focal motor/sensory deficits SKIN:  no rashes or significant lesions; nephrectomy incisions healing well.  LABORATORY DATA:  I have reviewed the data as listed    Component Value  Date/Time   NA 134 (L) 02/07/2017 1000   NA 146 (H) 05/25/2016 1514   K 3.9 02/07/2017 1000   CL 101 02/07/2017 1000   CO2 26 02/07/2017 1000   GLUCOSE 139 (H) 02/07/2017 1000   BUN 14 02/07/2017 1000   BUN 19 05/25/2016 1514   CREATININE 1.54 (H) 02/07/2017 1000   CALCIUM 9.1 02/07/2017 1000   PROT 7.2 02/07/2017 1000   ALBUMIN 4.1 02/07/2017 1000   AST 30 02/07/2017 1000   ALT 26 02/07/2017 1000   ALKPHOS 70 02/07/2017 1000   BILITOT 0.4 02/07/2017 1000   GFRNONAA 42 (L) 02/07/2017 1000   GFRAA 49 (L) 02/07/2017 1000    No results found for: SPEP, UPEP  Lab Results  Component Value Date   WBC 6.8 02/07/2017   NEUTROABS 5.0 02/07/2017   HGB 12.7 (L) 02/07/2017   HCT 36.8 (L) 02/07/2017   MCV 78.3 (L) 02/07/2017   PLT 145 (L) 02/07/2017      Chemistry      Component Value Date/Time   NA 134 (L) 02/07/2017 1000   NA 146 (H) 05/25/2016 1514   K 3.9 02/07/2017 1000   CL 101 02/07/2017 1000   CO2 26 02/07/2017 1000   BUN 14 02/07/2017 1000   BUN 19 05/25/2016 1514   CREATININE 1.54 (H) 02/07/2017 1000      Component Value Date/Time   CALCIUM 9.1 02/07/2017 1000   ALKPHOS 70 02/07/2017 1000   AST 30 02/07/2017 1000   ALT 26 02/07/2017 1000   BILITOT 0.4 02/07/2017 1000     RADIOGRAPHIC STUDIES: I have personally reviewed the radiological images as listed and agreed with the findings in the report. No results found.  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 Stage IV; FEB 20th CT lung- slight Progression Bil lung nodules [compared to Oct 2017]; discussed the rationale for continuing the opdivo.    # Currently on OPDIVO; proceed with cycle # 12 today. Labs today reviewed;  acceptable for treatment today.   # hypothyroidism- subclinical  [Oct 2017-TSH 5];  thyroid profile repeat Normal. Monitor for now.    # Eyes tearing/allregies- ketotifen  eye drops. He plans to get it from the pharmacy.  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   # fatugue- check testosterone level at next visit.  # follow up in 2 weeks/labs/ opdivo. We will order CT at next visit.  Orders Placed This Encounter  Procedures  . CBC with Differential    Standing Status:   Future    Standing Expiration Date:   02/07/2018  . Comprehensive metabolic panel    Standing Status:   Future    Standing Expiration Date:   02/07/2018  . Testosterone    Standing Status:   Future    Standing Expiration Date:   02/07/2018      Cammie Sickle, MD 02/07/2017 1:52 PM

## 2017-02-07 NOTE — Assessment & Plan Note (Addendum)
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 Stage IV; FEB 20th CT lung- slight Progression Bil lung nodules [compared to Oct 2017]; discussed the rationale for continuing the opdivo.    # Currently on OPDIVO; proceed with cycle # 12 today. Labs today reviewed;  acceptable for treatment today.   # hypothyroidism- subclinical  [Oct 2017-TSH 5];  thyroid profile repeat Normal. Monitor for now.    # Eyes tearing/allregies- ketotifen eye drops. He plans to get it from the pharmacy.  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   # fatugue- check testosterone level at next visit.  # follow up in 2 weeks/labs/ opdivo. We will order CT at next visit.

## 2017-02-18 DIAGNOSIS — F418 Other specified anxiety disorders: Secondary | ICD-10-CM | POA: Diagnosis not present

## 2017-02-18 DIAGNOSIS — R05 Cough: Secondary | ICD-10-CM | POA: Diagnosis not present

## 2017-02-21 ENCOUNTER — Inpatient Hospital Stay (HOSPITAL_BASED_OUTPATIENT_CLINIC_OR_DEPARTMENT_OTHER): Payer: PPO | Admitting: Internal Medicine

## 2017-02-21 ENCOUNTER — Inpatient Hospital Stay: Payer: PPO | Attending: Internal Medicine

## 2017-02-21 ENCOUNTER — Inpatient Hospital Stay: Payer: PPO

## 2017-02-21 VITALS — BP 126/80 | HR 56 | Temp 97.8°F | Resp 18 | Ht 73.0 in | Wt 223.0 lb

## 2017-02-21 DIAGNOSIS — Z807 Family history of other malignant neoplasms of lymphoid, hematopoietic and related tissues: Secondary | ICD-10-CM

## 2017-02-21 DIAGNOSIS — Z87891 Personal history of nicotine dependence: Secondary | ICD-10-CM | POA: Diagnosis not present

## 2017-02-21 DIAGNOSIS — C642 Malignant neoplasm of left kidney, except renal pelvis: Secondary | ICD-10-CM | POA: Diagnosis not present

## 2017-02-21 DIAGNOSIS — E039 Hypothyroidism, unspecified: Secondary | ICD-10-CM

## 2017-02-21 DIAGNOSIS — I2510 Atherosclerotic heart disease of native coronary artery without angina pectoris: Secondary | ICD-10-CM | POA: Insufficient documentation

## 2017-02-21 DIAGNOSIS — R05 Cough: Secondary | ICD-10-CM

## 2017-02-21 DIAGNOSIS — Z905 Acquired absence of kidney: Secondary | ICD-10-CM | POA: Insufficient documentation

## 2017-02-21 DIAGNOSIS — N189 Chronic kidney disease, unspecified: Secondary | ICD-10-CM

## 2017-02-21 DIAGNOSIS — Z7982 Long term (current) use of aspirin: Secondary | ICD-10-CM | POA: Diagnosis not present

## 2017-02-21 DIAGNOSIS — I129 Hypertensive chronic kidney disease with stage 1 through stage 4 chronic kidney disease, or unspecified chronic kidney disease: Secondary | ICD-10-CM | POA: Diagnosis not present

## 2017-02-21 DIAGNOSIS — R5383 Other fatigue: Secondary | ICD-10-CM

## 2017-02-21 DIAGNOSIS — Z87442 Personal history of urinary calculi: Secondary | ICD-10-CM | POA: Insufficient documentation

## 2017-02-21 DIAGNOSIS — Z85828 Personal history of other malignant neoplasm of skin: Secondary | ICD-10-CM | POA: Diagnosis not present

## 2017-02-21 DIAGNOSIS — Z5111 Encounter for antineoplastic chemotherapy: Secondary | ICD-10-CM | POA: Insufficient documentation

## 2017-02-21 DIAGNOSIS — C7800 Secondary malignant neoplasm of unspecified lung: Secondary | ICD-10-CM | POA: Diagnosis not present

## 2017-02-21 DIAGNOSIS — Z79899 Other long term (current) drug therapy: Secondary | ICD-10-CM | POA: Insufficient documentation

## 2017-02-21 DIAGNOSIS — H578 Other specified disorders of eye and adnexa: Secondary | ICD-10-CM | POA: Diagnosis not present

## 2017-02-21 LAB — CBC WITH DIFFERENTIAL/PLATELET
BASOS ABS: 0 10*3/uL (ref 0–0.1)
BASOS PCT: 1 %
EOS ABS: 0.3 10*3/uL (ref 0–0.7)
Eosinophils Relative: 5 %
HEMATOCRIT: 34.3 % — AB (ref 40.0–52.0)
Hemoglobin: 11.9 g/dL — ABNORMAL LOW (ref 13.0–18.0)
Lymphocytes Relative: 18 %
Lymphs Abs: 1 10*3/uL (ref 1.0–3.6)
MCH: 27.2 pg (ref 26.0–34.0)
MCHC: 34.6 g/dL (ref 32.0–36.0)
MCV: 78.4 fL — ABNORMAL LOW (ref 80.0–100.0)
MONO ABS: 0.6 10*3/uL (ref 0.2–1.0)
Monocytes Relative: 11 %
NEUTROS ABS: 3.8 10*3/uL (ref 1.4–6.5)
NEUTROS PCT: 65 %
Platelets: 142 10*3/uL — ABNORMAL LOW (ref 150–440)
RBC: 4.38 MIL/uL — ABNORMAL LOW (ref 4.40–5.90)
RDW: 16.8 % — AB (ref 11.5–14.5)
WBC: 5.8 10*3/uL (ref 3.8–10.6)

## 2017-02-21 LAB — COMPREHENSIVE METABOLIC PANEL
ALBUMIN: 4 g/dL (ref 3.5–5.0)
ALT: 24 U/L (ref 17–63)
ANION GAP: 5 (ref 5–15)
AST: 33 U/L (ref 15–41)
Alkaline Phosphatase: 72 U/L (ref 38–126)
BUN: 16 mg/dL (ref 6–20)
CHLORIDE: 102 mmol/L (ref 101–111)
CO2: 28 mmol/L (ref 22–32)
CREATININE: 1.58 mg/dL — AB (ref 0.61–1.24)
Calcium: 9.3 mg/dL (ref 8.9–10.3)
GFR, EST AFRICAN AMERICAN: 48 mL/min — AB (ref >60)
GFR, EST NON AFRICAN AMERICAN: 41 mL/min — AB (ref >60)
Glucose, Bld: 127 mg/dL — ABNORMAL HIGH (ref 65–99)
POTASSIUM: 4.1 mmol/L (ref 3.5–5.1)
Sodium: 135 mmol/L (ref 135–145)
Total Bilirubin: 0.6 mg/dL (ref 0.3–1.2)
Total Protein: 7.1 g/dL (ref 6.5–8.1)

## 2017-02-21 MED ORDER — ALBUTEROL SULFATE HFA 108 (90 BASE) MCG/ACT IN AERS: 2.0000 | INHALATION_SPRAY | Freq: Four times a day (QID) | RESPIRATORY_TRACT | 2 refills | Status: DC | PRN

## 2017-02-21 MED ORDER — PREDNISONE 20 MG PO TABS: 20.0000 mg | ORAL_TABLET | Freq: Every day | ORAL | 0 refills | Status: DC

## 2017-02-21 MED ORDER — SODIUM CHLORIDE 0.9 % IV SOLN
Freq: Once | INTRAVENOUS | Status: AC
  Administered 2017-02-21: 10:00:00 via INTRAVENOUS
  Filled 2017-02-21: qty 1000

## 2017-02-21 MED ORDER — SODIUM CHLORIDE 0.9 % IV SOLN
240.0000 mg | Freq: Once | INTRAVENOUS | Status: AC
  Administered 2017-02-21: 240 mg via INTRAVENOUS
  Filled 2017-02-21: qty 20

## 2017-02-21 NOTE — Progress Notes (Signed)
Pt states that he has a persistent cough. Evaluated by pcp with a chest xray, which demonstrated presence of his metastatic disease-unchanged from prior scans. Pt informed that his cough could be r/t to the immunotherapy. pt using tussinex as directed without relief of cough; he reports no productive sputum.  Pt also c/o of dry mouth. Drinks water (60+ ounces a day) - no relief from dry mouth. States that his "eyes frequently burn. I also feel extremely fatigue. Sometimes, I feel like I'm about to loose my balance."

## 2017-02-21 NOTE — Progress Notes (Signed)
[place] OFFICE PROGRESS NOTE  Patient Care Team: Dion Body, MD as PCP - General (Family Medicine) Jannet Mantis, MD (Dermatology) Robert Bellow, MD (General Surgery) Hollice Espy, MD as Consulting Physician (Urology)  No matching staging information was found for the patient.   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)    INTERVAL HISTORY:  [place] 76 y.o.  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  He continues to complain of cough; Nonproductive. He denies any hemoptysis. Denies any significant shortness of breath. He denies recent chest x-ray with his PCP. He intermittently hears wheezing.   Appetite is good.  Denies any shortness of breath or cough. No chronic diarrhea.  He is fairly active. No chest pain. No pain anywhere else.No weight loss. Weight gain. Complaints of itchy eyes tearing. He noted to have burning pain with the use of ophthalmic antihistamine.  REVIEW OF SYSTEMS:  A complete 10 point review of system is done which is negative except mentioned above/history of present illness.   PAST MEDICAL HISTORY :  Past Medical History:  Diagnosis Date  . CAD (coronary artery disease)   . Cancer ([place])    left arm  . Hypertension   .  Kidney stone   . Lung cancer ([place])   . Melanoma ([place]) 01/24/2016   right shoulder  . Thyroid nodule 02/02/2016   left BENIGN THYROID NODULE by FNA    PAST SURGICAL HISTORY :   Past Surgical History:  Procedure Laterality Date  . arm surgery Left    arm  . cardiac stents  2011   Angioplasty / Stenting Femoral-X2  . COLONOSCOPY  04/01/12  . CORONARY ANGIOPLASTY    . EXCISION MELANOMA WITH SENTINEL LYMPH NODE BIOPSY Right 01/24/2016   Procedure: EXCISION MELANOMA Right Shoulder;  Surgeon: Robert Bellow, MD;  Location: [facility] ORS;  Service: General;  Laterality: Right;  . LAPAROSCOPIC NEPHRECTOMY, HAND ASSISTED Left 04/24/2016   Procedure: HAND ASSISTED LAPAROSCOPIC NEPHRECTOMY;  Surgeon: Hollice Espy, MD;  Location: [facility] ORS;  Service: Urology;  Laterality: Left;  Marland Kitchen VIDEO ASSISTED THORACOSCOPY (VATS)/THOROCOTOMY Left 03/08/2016   Procedure: VIDEO ASSISTED THORACOSCOPY (VATS) with lung biopsy - Left ;  Surgeon: Robert Bellow, MD;  Location: [facility] ORS;  Service: General;  Laterality: Left;    FAMILY HISTORY :   Family History  Problem Relation Age of Onset  . Hodgkin's lymphoma Daughter 5  . Kidney cancer Neg Hx   . Kidney disease Neg Hx   . Prostate cancer Neg Hx     SOCIAL HISTORY:   Social History  Substance Use Topics  . Smoking status: Former Smoker    Types: Cigarettes    Start date: 01/18/1956    Quit date: 01/17/1969  . Smokeless tobacco: Never Used  . Alcohol  use No     Comment: BEER OCC    ALLERGIES:  is allergic to lisinopril and other.  MEDICATIONS:  Current Outpatient Prescriptions  Medication Sig Dispense Refill  . acetaminophen (TYLENOL) 500 MG tablet Take 500 mg by mouth every 6 (six) hours as needed for moderate pain.    Marland Kitchen amLODipine (NORVASC) 5 MG tablet TAKE 1 TABLET (5 MG TOTAL) BY MOUTH ONCE DAILY.  0  . aspirin EC 81 MG tablet Take 81 mg by mouth daily.    Marland Kitchen atorvastatin (LIPITOR) 40 MG tablet Take 40 mg by mouth daily.    .  chlorpheniramine-HYDROcodone (TUSSIONEX) 10-8 MG/5ML SUER Take 5 mLs by mouth as needed for cough.    . clopidogrel (PLAVIX) 75 MG tablet Take 75 mg by mouth daily.    Marland Kitchen docusate sodium (COLACE) 100 MG capsule TAKE 1 CAPSULE BY MOUTH TWICE A DAY 60 capsule 3  . losartan (COZAAR) 50 MG tablet Take 50 mg by mouth every morning.     Marland Kitchen MELATONIN PO Take 10 mg by mouth at bedtime.     . metoprolol succinate (TOPROL-XL) 50 MG 24 hr tablet Take 50 mg by mouth every morning.     . sertraline (ZOLOFT) 100 MG tablet Take 200 mg by mouth daily.     . tamsulosin (FLOMAX) 0.4 MG CAPS capsule Take 0.4 mg by mouth every morning.     Marland Kitchen albuterol (PROVENTIL HFA;VENTOLIN HFA) 108 (90 Base) MCG/ACT inhaler Inhale 2 puffs into the lungs every 6 (six) hours as needed for wheezing or shortness of breath. 1 Inhaler 2  . magic mouthwash w/lidocaine SOLN Take 5 mLs by mouth 4 (four) times daily. (Patient not taking: Reported on 02/07/2017) 480 mL 3  . nitroGLYCERIN (NITROSTAT) 0.4 MG SL tablet Place 0.4 mg under the tongue every 5 (five) minutes as needed.     . predniSONE (DELTASONE) 20 MG tablet Take 1 tablet (20 mg total) by mouth daily with breakfast. Once a day with food x 10 days. 10 tablet 0   No current facility-administered medications for this visit.     PHYSICAL EXAMINATION: ECOG PERFORMANCE STATUS: 0 - Asymptomatic  BP 126/80 (BP Location: Right Arm, Patient Position: Sitting)   Pulse (!) 56   Temp 97.8 F (36.6 C) (Tympanic)   Resp 18   Ht '6\' 1"'$  (1.854 m)   Wt 223 lb (101.2 kg)   BMI 29.42 kg/m   Filed Weights   02/21/17 0848  Weight: 223 lb (101.2 kg)    GENERAL: Well-nourished well-developed; Alert, no distress and comfortable. Accompanied by his daughter; wife.  EYES: no pallor or icterus OROPHARYNX: no thrush or ulceration; NECK: supple, no masses felt LYMPH:  no palpable lymphadenopathy in the cervical, axillary or inguinal regions LUNGS: clear to auscultation and  No wheeze or  crackles HEART/CVS: regular rate & rhythm and no murmurs; No lower extremity edema ABDOMEN:abdomen soft, non-tender and normal bowel sounds Musculoskeletal:no cyanosis of digits and no clubbing  PSYCH: alert & oriented x 3 with fluent speech NEURO: no focal motor/sensory deficits SKIN:  no rashes or significant lesions.   LABORATORY DATA:  I have reviewed the data as listed    Component Value Date/Time   NA 135 02/21/2017 0822   NA 146 (H) 05/25/2016 1514   K 4.1 02/21/2017 0822   CL 102 02/21/2017 0822   CO2 28 02/21/2017 0822   GLUCOSE 127 (H) 02/21/2017 0822   BUN 16 02/21/2017 0822   BUN 19 05/25/2016  1514   CREATININE 1.58 (H) 02/21/2017 0822   CALCIUM 9.3 02/21/2017 0822   PROT 7.1 02/21/2017 0822   ALBUMIN 4.0 02/21/2017 0822   AST 33 02/21/2017 0822   ALT 24 02/21/2017 0822   ALKPHOS 72 02/21/2017 0822   BILITOT 0.6 02/21/2017 0822   GFRNONAA 41 (L) 02/21/2017 0822   GFRAA 48 (L) 02/21/2017 0822    No results found for: SPEP, UPEP  Lab Results  Component Value Date   WBC 5.8 02/21/2017   NEUTROABS 3.8 02/21/2017   HGB 11.9 (L) 02/21/2017   HCT 34.3 (L) 02/21/2017   MCV 78.4 (L) 02/21/2017   PLT 142 (L) 02/21/2017      Chemistry      Component Value Date/Time   NA 135 02/21/2017 0822   NA 146 (H) 05/25/2016 1514   K 4.1 02/21/2017 0822   CL 102 02/21/2017 0822   CO2 28 02/21/2017 0822   BUN 16 02/21/2017 0822   BUN 19 05/25/2016 1514   CREATININE 1.58 (H) 02/21/2017 0822      Component Value Date/Time   CALCIUM 9.3 02/21/2017 0822   ALKPHOS 72 02/21/2017 0822   AST 33 02/21/2017 0822   ALT 24 02/21/2017 0822   BILITOT 0.6 02/21/2017 9476     RADIOGRAPHIC STUDIES: I have personally reviewed the radiological images as listed and agreed with the findings in the report. No results found.  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 Stage IV; FEB 20th CT lung- slight Progression Bil lung nodules [compared to Oct 2017]; discussed the rationale for continuing the opdivo.    # Currently on OPDIVO; proceed with cycle # 13 today. Labs today reviewed;  acceptable for treatment today.   # hypothyroidism- subclinical  [Oct 2017-TSH 5];  thyroid profile repeat Normal. Monitor for now.    # Cough- ? Allergies; recommend anti-histamine; abuterol inhaler; low dose prednisone '20mg'$ /day.   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   # fatugue- check testosterone level at next visit.  # follow up in 2 weeks/labs/ opdivo; CT chest non-contrast prior.   Orders Placed This Encounter  Procedures  . CT CHEST WO CONTRAST    Standing Status:   Future    Standing Expiration Date:   04/23/2018    Order Specific Question:   Reason for Exam (SYMPTOM  OR DIAGNOSIS REQUIRED)    Answer:   renal cancer with mets to lung    Comments:   please compare the scan to oct 2017.     Order Specific Question:   Preferred imaging location?    Answer:   [facility]      Cammie Sickle, MD 02/22/2017 5:48 PM

## 2017-02-21 NOTE — Assessment & Plan Note (Addendum)
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 Stage IV; FEB 20th CT lung- slight Progression Bil lung nodules [compared to Oct 2017]; discussed the rationale for continuing the opdivo.    # Currently on OPDIVO; proceed with cycle # 13 today. Labs today reviewed;  acceptable for treatment today.   # hypothyroidism- subclinical  [Oct 2017-TSH 5];  thyroid profile repeat Normal. Monitor for now.    # Cough- ? Allergies; recommend anti-histamine; abuterol inhaler; low dose prednisone '20mg'$ /day.   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   # fatugue- check testosterone level at next visit.  # follow up in 2 weeks/labs/ opdivo; CT chest non-contrast prior.

## 2017-02-22 LAB — TESTOSTERONE: TESTOSTERONE: 401 ng/dL (ref 264–916)

## 2017-03-05 ENCOUNTER — Ambulatory Visit
Admission: RE | Admit: 2017-03-05 | Discharge: 2017-03-05 | Disposition: A | Discharge status: Home / Self Care | Payer: PPO | Source: Ambulatory Visit | Attending: Internal Medicine | Admitting: Internal Medicine

## 2017-03-05 DIAGNOSIS — R918 Other nonspecific abnormal finding of lung field: Secondary | ICD-10-CM | POA: Insufficient documentation

## 2017-03-05 DIAGNOSIS — J439 Emphysema, unspecified: Secondary | ICD-10-CM | POA: Diagnosis not present

## 2017-03-05 DIAGNOSIS — C642 Malignant neoplasm of left kidney, except renal pelvis: Secondary | ICD-10-CM | POA: Diagnosis not present

## 2017-03-05 DIAGNOSIS — C782 Secondary malignant neoplasm of pleura: Secondary | ICD-10-CM | POA: Diagnosis not present

## 2017-03-05 DIAGNOSIS — C649 Malignant neoplasm of unspecified kidney, except renal pelvis: Secondary | ICD-10-CM | POA: Diagnosis not present

## 2017-03-05 DIAGNOSIS — E041 Nontoxic single thyroid nodule: Secondary | ICD-10-CM | POA: Diagnosis not present

## 2017-03-07 ENCOUNTER — Other Ambulatory Visit: Payer: Self-pay | Admitting: *Deleted

## 2017-03-07 ENCOUNTER — Inpatient Hospital Stay: Payer: PPO

## 2017-03-07 ENCOUNTER — Inpatient Hospital Stay (HOSPITAL_BASED_OUTPATIENT_CLINIC_OR_DEPARTMENT_OTHER): Payer: PPO | Admitting: Internal Medicine

## 2017-03-07 VITALS — BP 122/72 | HR 64 | Temp 96.5°F | Resp 22 | Ht 73.0 in | Wt 220.0 lb

## 2017-03-07 DIAGNOSIS — I129 Hypertensive chronic kidney disease with stage 1 through stage 4 chronic kidney disease, or unspecified chronic kidney disease: Secondary | ICD-10-CM

## 2017-03-07 DIAGNOSIS — Z7982 Long term (current) use of aspirin: Secondary | ICD-10-CM

## 2017-03-07 DIAGNOSIS — Z5111 Encounter for antineoplastic chemotherapy: Secondary | ICD-10-CM | POA: Diagnosis not present

## 2017-03-07 DIAGNOSIS — R05 Cough: Secondary | ICD-10-CM | POA: Diagnosis not present

## 2017-03-07 DIAGNOSIS — E039 Hypothyroidism, unspecified: Secondary | ICD-10-CM | POA: Diagnosis not present

## 2017-03-07 DIAGNOSIS — Z807 Family history of other malignant neoplasms of lymphoid, hematopoietic and related tissues: Secondary | ICD-10-CM

## 2017-03-07 DIAGNOSIS — Z79899 Other long term (current) drug therapy: Secondary | ICD-10-CM

## 2017-03-07 DIAGNOSIS — Z85828 Personal history of other malignant neoplasm of skin: Secondary | ICD-10-CM

## 2017-03-07 DIAGNOSIS — Z87442 Personal history of urinary calculi: Secondary | ICD-10-CM

## 2017-03-07 DIAGNOSIS — C7800 Secondary malignant neoplasm of unspecified lung: Secondary | ICD-10-CM | POA: Diagnosis not present

## 2017-03-07 DIAGNOSIS — Z87891 Personal history of nicotine dependence: Secondary | ICD-10-CM

## 2017-03-07 DIAGNOSIS — H578 Other specified disorders of eye and adnexa: Secondary | ICD-10-CM

## 2017-03-07 DIAGNOSIS — C642 Malignant neoplasm of left kidney, except renal pelvis: Secondary | ICD-10-CM

## 2017-03-07 DIAGNOSIS — I2510 Atherosclerotic heart disease of native coronary artery without angina pectoris: Secondary | ICD-10-CM | POA: Diagnosis not present

## 2017-03-07 DIAGNOSIS — N189 Chronic kidney disease, unspecified: Secondary | ICD-10-CM | POA: Diagnosis not present

## 2017-03-07 DIAGNOSIS — R059 Cough, unspecified: Secondary | ICD-10-CM

## 2017-03-07 DIAGNOSIS — Z905 Acquired absence of kidney: Secondary | ICD-10-CM | POA: Diagnosis not present

## 2017-03-07 LAB — CBC WITH DIFFERENTIAL/PLATELET
BASOS PCT: 0 %
Basophils Absolute: 0 10*3/uL (ref 0–0.1)
EOS ABS: 0.2 10*3/uL (ref 0–0.7)
Eosinophils Relative: 3 %
HEMATOCRIT: 36.4 % — AB (ref 40.0–52.0)
Hemoglobin: 12.5 g/dL — ABNORMAL LOW (ref 13.0–18.0)
Lymphocytes Relative: 13 %
Lymphs Abs: 0.9 10*3/uL — ABNORMAL LOW (ref 1.0–3.6)
MCH: 27.1 pg (ref 26.0–34.0)
MCHC: 34.4 g/dL (ref 32.0–36.0)
MCV: 78.9 fL — ABNORMAL LOW (ref 80.0–100.0)
MONOS PCT: 6 %
Monocytes Absolute: 0.5 10*3/uL (ref 0.2–1.0)
NEUTROS ABS: 5.5 10*3/uL (ref 1.4–6.5)
NEUTROS PCT: 78 %
Platelets: 166 10*3/uL (ref 150–440)
RBC: 4.62 MIL/uL (ref 4.40–5.90)
RDW: 16.6 % — ABNORMAL HIGH (ref 11.5–14.5)
WBC: 7.1 10*3/uL (ref 3.8–10.6)

## 2017-03-07 LAB — COMPREHENSIVE METABOLIC PANEL
ALBUMIN: 4.1 g/dL (ref 3.5–5.0)
ALK PHOS: 80 U/L (ref 38–126)
ALT: 19 U/L (ref 17–63)
AST: 29 U/L (ref 15–41)
Anion gap: 5 (ref 5–15)
BILIRUBIN TOTAL: 0.5 mg/dL (ref 0.3–1.2)
BUN: 17 mg/dL (ref 6–20)
CALCIUM: 9.2 mg/dL (ref 8.9–10.3)
CO2: 27 mmol/L (ref 22–32)
CREATININE: 1.59 mg/dL — AB (ref 0.61–1.24)
Chloride: 101 mmol/L (ref 101–111)
GFR calc Af Amer: 47 mL/min — ABNORMAL LOW (ref >60)
GFR calc non Af Amer: 41 mL/min — ABNORMAL LOW (ref >60)
GLUCOSE: 193 mg/dL — AB (ref 65–99)
Potassium: 3.6 mmol/L (ref 3.5–5.1)
Sodium: 133 mmol/L — ABNORMAL LOW (ref 135–145)
TOTAL PROTEIN: 7 g/dL (ref 6.5–8.1)

## 2017-03-07 MED ORDER — SODIUM CHLORIDE 0.9 % IV SOLN
Freq: Once | INTRAVENOUS | Status: AC
Start: 2017-03-07 — End: 2017-03-07
  Administered 2017-03-07: 10:00:00 via INTRAVENOUS
  Filled 2017-03-07: qty 1000

## 2017-03-07 MED ORDER — FLUTICASONE-SALMETEROL 500-50 MCG/DOSE IN AEPB: 1.0000 | INHALATION_SPRAY | Freq: Two times a day (BID) | RESPIRATORY_TRACT | 3 refills | Status: DC

## 2017-03-07 MED ORDER — PREDNISONE 20 MG PO TABS: ORAL_TABLET | ORAL | 0 refills | Status: DC

## 2017-03-07 MED ORDER — SODIUM CHLORIDE 0.9 % IV SOLN
240.0000 mg | Freq: Once | INTRAVENOUS | Status: AC
  Administered 2017-03-07: 240 mg via INTRAVENOUS
  Filled 2017-03-07: qty 24

## 2017-03-07 NOTE — Assessment & Plan Note (Addendum)
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 Stage IV; April 16th  20th CT lung- STABLE  Bil lung nodules approximately 1-2 cm in size. [compared to feb 2017]; discussed the rationale for continuing the opdivo.    # Currently on OPDIVO; proceed with cycle # 14 today. Labs today reviewed;  acceptable for treatment today.   # hypothyroidism- subclinical  [Oct 2017-TSH 5];  thyroid profile repeat Normal. Monitor for now.    # Cough- ? Allergies; recommend anti-histamine; abuterol inhaler; add advair; recommend prednisone; referral to pulmonary.   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   # follow up in 2 weeks/labs/ opdivo.   # I reviewed the blood work- with the patient in detail; also reviewed the imaging independently [as summarized above]; and with the patient in detail.

## 2017-03-07 NOTE — Progress Notes (Signed)
[place] OFFICE PROGRESS NOTE  Patient Care Team: Dion Body, MD as PCP - General (Family Medicine) Jannet Mantis, MD (Dermatology) Robert Bellow, MD (General Surgery) Hollice Espy, MD as Consulting Physician (Urology)  No matching staging information was found for the patient.   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  male pleasant patient above history of  renal cell cancer status post left nephrectomy- With metastases to the lung- Is currently started on Opdivo status post cycle 13 Is here for follow-up/ To review the results of the restaging CAT scan.  Unfortunately the patient's cough has not improved. He is also status post prednisone for about 2 weeks. No improvement after using the albuterol inhaler.  Appetite is good.  Denies any shortness of breath or chest pain No chronic diarrhea.No pain anywhere else.No weight loss.   REVIEW OF SYSTEMS:  A complete 10 point review of system is done which is negative except mentioned above/history of present illness.   PAST MEDICAL HISTORY :  Past Medical History:  Diagnosis Date  . CAD (coronary artery disease)   . Cancer ([place])    left arm  . Hypertension   . Kidney stone   . Lung cancer ([place])   . Melanoma ([place]) 01/24/2016   right shoulder  . Thyroid nodule 02/02/2016   left BENIGN THYROID NODULE by FNA     PAST SURGICAL HISTORY :   Past Surgical History:  Procedure Laterality Date  . arm surgery Left    arm  . cardiac stents  2011   Angioplasty / Stenting Femoral-X2  . COLONOSCOPY  04/01/12  . CORONARY ANGIOPLASTY    . EXCISION MELANOMA WITH SENTINEL LYMPH NODE BIOPSY Right 01/24/2016   Procedure: EXCISION MELANOMA Right Shoulder;  Surgeon: Robert Bellow, MD;  Location: [facility] ORS;  Service: General;  Laterality: Right;  . LAPAROSCOPIC NEPHRECTOMY, HAND ASSISTED Left 04/24/2016   Procedure: HAND ASSISTED LAPAROSCOPIC NEPHRECTOMY;  Surgeon: Hollice Espy, MD;  Location: [facility] ORS;  Service: Urology;  Laterality: Left;  Marland Kitchen VIDEO ASSISTED THORACOSCOPY (VATS)/THOROCOTOMY Left 03/08/2016   Procedure: VIDEO ASSISTED THORACOSCOPY (VATS) with lung biopsy - Left ;  Surgeon: Robert Bellow, MD;  Location: [facility] ORS;  Service: General;  Laterality: Left;    FAMILY HISTORY :   Family History  Problem Relation Age of Onset  . Hodgkin's lymphoma Daughter 5  . Kidney cancer Neg Hx   . Kidney disease Neg Hx   . Prostate cancer Neg Hx     SOCIAL HISTORY:   Social History  Substance Use Topics  . Smoking status: Former Smoker    Types: Cigarettes    Start date: 01/18/1956    Quit date: 01/17/1969  . Smokeless tobacco: Never Used  . Alcohol use No     Comment: BEER OCC    ALLERGIES:  is allergic to lisinopril and other.  MEDICATIONS:  Current Outpatient Prescriptions  Medication Sig Dispense Refill  . acetaminophen (TYLENOL) 500 MG tablet Take 500 mg by mouth every 6 (six) hours as needed for moderate pain.    Marland Kitchen albuterol (PROVENTIL HFA;VENTOLIN HFA) 108 (90 Base) MCG/ACT inhaler Inhale 2 puffs into the lungs every 6 (six) hours as needed for wheezing or shortness of breath. 1 Inhaler 2  . amLODipine (NORVASC) 5 MG tablet TAKE 1 TABLET (5 MG TOTAL) BY MOUTH ONCE DAILY.  0  . aspirin EC 81 MG tablet Take 81 mg by mouth daily.    Marland Kitchen atorvastatin (LIPITOR) 40 MG tablet Take 40 mg by mouth daily.     . clopidogrel (PLAVIX) 75 MG tablet Take 75 mg by mouth daily.    Marland Kitchen docusate sodium (COLACE) 100 MG capsule TAKE 1 CAPSULE BY MOUTH TWICE A DAY 60 capsule 3  . loratadine (CLARITIN) 10 MG tablet Take 10 mg by mouth daily.    Marland Kitchen losartan (COZAAR) 50 MG tablet Take 50 mg by mouth every morning.     Marland Kitchen MELATONIN PO Take 10 mg by mouth at bedtime.     . metoprolol succinate (TOPROL-XL) 50 MG 24 hr tablet Take 50 mg by mouth every morning.     . sertraline (ZOLOFT) 100 MG tablet Take 200 mg by mouth daily.     . tamsulosin (FLOMAX) 0.4 MG CAPS capsule Take 0.4 mg by mouth every morning.     . Fluticasone-Salmeterol (ADVAIR DISKUS) 500-50 MCG/DOSE AEPB Inhale 1 puff into the lungs 2 (two) times daily. 1 each 3  . magic mouthwash w/lidocaine SOLN Take 5 mLs by mouth 4 (four) times daily. (Patient not taking: Reported on 02/07/2017) 480 mL 3  . nitroGLYCERIN (NITROSTAT) 0.4 MG SL tablet Place 0.4 mg under the tongue every 5 (five) minutes as needed.     . predniSONE (DELTASONE) 20 MG tablet Take 3 pills a day x1 week; and then 2 pills a day x 1 week; and 1 pill once a day 1 x1 week; take with food. 40 tablet 0   No current facility-administered medications for this visit.     PHYSICAL EXAMINATION: ECOG PERFORMANCE STATUS: 0 - Asymptomatic  BP 122/72 (BP Location: Left Arm, Patient Position: Sitting)   Pulse 64   Temp (!) 96.5 F (35.8 C) (Tympanic)   Resp (!) 22   Ht '6\' 1"'$  (1.854 m)   Wt 220 lb (99.8 kg)   BMI 29.03 kg/m   Filed Weights   03/07/17 0847  Weight: 220 lb (99.8 kg)    GENERAL: Well-nourished well-developed; Alert, no distress and comfortable. Accompanied by his daughter; wife.  EYES: no pallor or icterus OROPHARYNX: no thrush or ulceration; NECK: supple, no masses felt LYMPH:  no palpable lymphadenopathy in the cervical, axillary or inguinal regions LUNGS: clear to auscultation and  No wheeze or crackles HEART/CVS: regular rate & rhythm and no murmurs; No lower extremity  edema ABDOMEN:abdomen soft, non-tender and normal bowel sounds Musculoskeletal:no cyanosis of digits and no clubbing  PSYCH: alert & oriented x 3 with fluent speech NEURO: no focal motor/sensory deficits SKIN:  no rashes or significant lesions.   LABORATORY DATA:  I have reviewed the data as listed    Component Value Date/Time   NA 133 (L) 03/07/2017 0830   NA 146 (H) 05/25/2016 1514   K 3.6 03/07/2017 0830   CL 101 03/07/2017 0830   CO2 27 03/07/2017 0830   GLUCOSE 193 (H) 03/07/2017 0830   BUN 17 03/07/2017  0830   BUN 19 05/25/2016 1514   CREATININE 1.59 (H) 03/07/2017 0830   CALCIUM 9.2 03/07/2017 0830   PROT 7.0 03/07/2017 0830   ALBUMIN 4.1 03/07/2017 0830   AST 29 03/07/2017 0830   ALT 19 03/07/2017 0830   ALKPHOS 80 03/07/2017 0830   BILITOT 0.5 03/07/2017 0830   GFRNONAA 41 (L) 03/07/2017 0830   GFRAA 47 (L) 03/07/2017 0830    No results found for: SPEP, UPEP  Lab Results  Component Value Date   WBC 7.1 03/07/2017   NEUTROABS 5.5 03/07/2017   HGB 12.5 (L) 03/07/2017   HCT 36.4 (L) 03/07/2017   MCV 78.9 (L) 03/07/2017   PLT 166 03/07/2017      Chemistry      Component Value Date/Time   NA 133 (L) 03/07/2017 0830   NA 146 (H) 05/25/2016 1514   K 3.6 03/07/2017 0830   CL 101 03/07/2017 0830   CO2 27 03/07/2017 0830   BUN 17 03/07/2017 0830   BUN 19 05/25/2016 1514   CREATININE 1.59 (H) 03/07/2017 0830      Component Value Date/Time   CALCIUM 9.2 03/07/2017 0830   ALKPHOS 80 03/07/2017 0830   AST 29 03/07/2017 0830   ALT 19 03/07/2017 0830   BILITOT 0.5 03/07/2017 0830     RADIOGRAPHIC STUDIES: I have personally reviewed the radiological images as listed and agreed with the findings in the report. No results found.  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 Stage IV; April 16th  20th CT lung- STABLE  Bil lung nodules  approximately 1-2 cm in size. [compared to feb 2017]; discussed the rationale for continuing the opdivo.    # Currently on OPDIVO; proceed with cycle # 14 today. Labs today reviewed;  acceptable for treatment today.   # hypothyroidism- subclinical  [Oct 2017-TSH 5];  thyroid profile repeat Normal. Monitor for now.    # Cough- ? Allergies; recommend anti-histamine; abuterol inhaler; add advair; recommend prednisone; referral to pulmonary.   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   # follow up in 2 weeks/labs/ opdivo.   # I reviewed the blood work- with the patient in detail; also reviewed the imaging independently [as summarized above]; and with the patient in detail.    Orders Placed This Encounter  Procedures  . Ambulatory referral to Pulmonology    Referral Priority:   Routine    Referral Type:   Consultation    Referral Reason:   Specialty Services Required    Referred to Provider:   Sharia Reeve, MD    Requested Specialty:   Pulmonary Disease    Number of Visits Requested:   [place], MD 03/14/2017 4:17 PM

## 2017-03-18 ENCOUNTER — Ambulatory Visit (INDEPENDENT_AMBULATORY_CARE_PROVIDER_SITE_OTHER): Payer: PPO | Admitting: Internal Medicine

## 2017-03-18 ENCOUNTER — Encounter: Payer: Self-pay | Admitting: Internal Medicine

## 2017-03-18 VITALS — BP 126/60 | HR 60 | Resp 16 | Ht 73.0 in | Wt 218.0 lb

## 2017-03-18 DIAGNOSIS — R0602 Shortness of breath: Secondary | ICD-10-CM | POA: Diagnosis not present

## 2017-03-18 DIAGNOSIS — R059 Cough, unspecified: Secondary | ICD-10-CM

## 2017-03-18 DIAGNOSIS — R05 Cough: Secondary | ICD-10-CM

## 2017-03-18 DIAGNOSIS — J449 Chronic obstructive pulmonary disease, unspecified: Secondary | ICD-10-CM | POA: Diagnosis not present

## 2017-03-18 NOTE — Progress Notes (Signed)
[facility] Consultation      Date: 03/18/2017,   MRN# [id number] Glen Davis 1941/03/17 Code Status:  Code Status History    Date Active Date Inactive Code Status Order ID Comments User Context   03/08/2016  1:29 PM 03/10/2016  2:21 PM Full Code [id number]  Robert Bellow, MD Inpatient     Hosp day:'@LENGTHOFSTAYDAYS'$ @ Referring MD: '@ATDPROV'$ @     PCP:      AdmissionWeight: 218 lb (98.9 kg)                 CurrentWeight: 218 lb (98.9 kg) Glen Davis is a 76 y.o. old male seen in consultation for cough at the request of Dr. Rogue Bussing     CHIEF COMPLAINT:  cough    HISTORY OF PRESENT ILLNESS   76 yo white male with previosu smoking histiory 2 ppd for 20 years Seen today for evaluation for cough for 2 months  Patient had 2 episodes of sinus infection and had been given 2 rounds of prednisone and 1 round of ABX And had lots of sinus drainage that was causing cough Has tried tussionex with codiene and tesselon perles and has not really helped  He states that his cough was really bad, but has improved over last 2 weeks Patient was started on Advair 3 months and also uses albuterol every 6hrs  Patient has h/o pulm nodules mets from L renal cell carcinoma-nodules are stable since last scan Patient has started on new Immunotherapy that has side affect of cough, however, I believe that the cough is improving with time  Patient has DOE for past 6 months and his DOE resolves within several minutes of rest No significant signs of sleep issues at this time  Office SPiro shows ratio of 77% Fev1 2.7L 85% predicted fvC 3.5L 80% predicted fef25/75 2.3L 99%predicted  Interpretation-flow volumes may suggest obstruction  No signs of infection at this time No lower ext swelling noted   No   No signs o f  PAST MEDICAL HISTORY   Past Medical History:  Diagnosis Date  . CAD (coronary artery disease)   . Cancer ([place])    left arm  . Hypertension   .  Kidney stone   . Lung cancer ([place])   . Melanoma ([place]) 01/24/2016   right shoulder  . Thyroid nodule 02/02/2016   left BENIGN THYROID NODULE by FNA     SURGICAL HISTORY   Past Surgical History:  Procedure Laterality Date  . arm surgery Left    arm  . cardiac stents  2011   Angioplasty / Stenting Femoral-X2  . COLONOSCOPY  04/01/12  . CORONARY ANGIOPLASTY    . EXCISION MELANOMA WITH SENTINEL LYMPH NODE BIOPSY Right 01/24/2016   Procedure: EXCISION MELANOMA Right Shoulder;  Surgeon: Robert Bellow, MD;  Location: [facility] ORS;  Service: General;  Laterality: Right;  . LAPAROSCOPIC NEPHRECTOMY, HAND ASSISTED Left 04/24/2016   Procedure: HAND ASSISTED LAPAROSCOPIC NEPHRECTOMY;  Surgeon: Hollice Espy, MD;  Location: [facility] ORS;  Service: Urology;  Laterality: Left;  Marland Kitchen VIDEO ASSISTED THORACOSCOPY (VATS)/THOROCOTOMY Left 03/08/2016   Procedure: VIDEO ASSISTED THORACOSCOPY (VATS) with lung biopsy - Left ;  Surgeon: Robert Bellow, MD;  Location: [facility] ORS;  Service: General;  Laterality: Left;     FAMILY HISTORY   Family History  Problem Relation Age of Onset  . Hodgkin's lymphoma Daughter 5  . Kidney cancer Neg Hx   . Kidney disease Neg Hx   . Prostate  cancer Neg Hx      SOCIAL HISTORY   Social History  Substance Use Topics  . Smoking status: Former Smoker    Types: Cigarettes    Start date: 01/18/1956    Quit date: 01/17/1969  . Smokeless tobacco: Never Used  . Alcohol use No     Comment: BEER OCC     MEDICATIONS    Home Medication:  Current Outpatient Rx  . Order #: [id number] Class: Historical Med  . Order #: [id number] Class: Normal  . Order #: [id number] Class: Historical Med  . Order #: [id number] Class: Historical Med  . Order #: [id number] Class: Historical Med  . Order #: [id number] Class: Historical Med  . Order #: [id number] Class: Normal  . Order #: [id number] Class: Normal  . Order #: [id number] Class: Historical Med  . Order #: [id number] Class: Historical Med  . Order #:  [id number] Class: Print  . Order #: [id number] Class: Historical Med  . Order #: [id number] Class: Historical Med  . Order #: [id number] Class: Historical Med  . Order #: [id number] Class: Normal  . Order #: [id number] Class: Historical Med  . Order #: [id number] Class: Historical Med    Current Medication:  Current Outpatient Prescriptions:  .  acetaminophen (TYLENOL) 500 MG tablet, Take 500 mg by mouth every 6 (six) hours as needed for moderate pain., Disp: , Rfl:  .  albuterol (PROVENTIL HFA;VENTOLIN HFA) 108 (90 Base) MCG/ACT inhaler, Inhale 2 puffs into the lungs every 6 (six) hours as needed for wheezing or shortness of breath., Disp: 1 Inhaler, Rfl: 2 .  amLODipine (NORVASC) 5 MG tablet, TAKE 1 TABLET (5 MG TOTAL) BY MOUTH ONCE DAILY., Disp: , Rfl: 0 .  aspirin EC 81 MG tablet, Take 81 mg by mouth daily., Disp: , Rfl:  .  atorvastatin (LIPITOR) 40 MG tablet, Take 40 mg by mouth daily., Disp: , Rfl:  .  clopidogrel (PLAVIX) 75 MG tablet, Take 75 mg by mouth daily., Disp: , Rfl:  .  docusate sodium (COLACE) 100 MG capsule, TAKE 1 CAPSULE BY MOUTH TWICE A DAY, Disp: 60 capsule, Rfl: 3 .  Fluticasone-Salmeterol (ADVAIR DISKUS) 500-50 MCG/DOSE AEPB, Inhale 1 puff into the lungs 2 (two) times daily., Disp: 1 each, Rfl: 3 .  loratadine (CLARITIN) 10 MG tablet, Take 10 mg by mouth daily., Disp: , Rfl:  .  losartan (COZAAR) 50 MG tablet, Take 50 mg by mouth every morning. , Disp: , Rfl:  .  magic mouthwash w/lidocaine SOLN, Take 5 mLs by mouth 4 (four) times daily., Disp: 480 mL, Rfl: 3 .  MELATONIN PO, Take 10 mg by mouth at bedtime. , Disp: , Rfl:  .  metoprolol succinate (TOPROL-XL) 50 MG 24 hr tablet, Take 50 mg by mouth every morning. , Disp: , Rfl:  .  nitroGLYCERIN (NITROSTAT) 0.4 MG SL tablet, Place 0.4 mg under the tongue every 5 (five) minutes as needed. , Disp: , Rfl:  .  predniSONE (DELTASONE) 20 MG tablet, Take 3 pills a day x1 week; and then 2 pills a day x 1 week; and 1 pill once a day 1 x1  week; take with food., Disp: 40 tablet, Rfl: 0 .  sertraline (ZOLOFT) 100 MG tablet, Take 200 mg by mouth daily. , Disp: , Rfl:  .  tamsulosin (FLOMAX) 0.4 MG CAPS capsule, Take 0.4 mg by mouth every morning. , Disp: , Rfl:     ALLERGIES   Lisinopril and Other     REVIEW OF SYSTEMS   Review of Systems  Constitutional: Negative for chills, diaphoresis, fever, malaise/fatigue  and weight loss.  HENT: Negative for congestion and hearing loss.   Eyes: Negative for blurred vision and double vision.  Respiratory: Positive for cough and shortness of breath. Negative for hemoptysis, sputum production and wheezing.   Cardiovascular: Negative for chest pain, palpitations and orthopnea.  Gastrointestinal: Negative for abdominal pain, heartburn, nausea and vomiting.  Genitourinary: Negative for dysuria.  Musculoskeletal: Negative for myalgias.  Skin: Negative for rash.  Neurological: Negative for dizziness and weakness.  Endo/Heme/Allergies: Does not bruise/bleed easily.  Psychiatric/Behavioral: The patient is not nervous/anxious.   All other systems reviewed and are negative.    VS: BP 126/60 (BP Location: Right Arm, Patient Position: Sitting, Cuff Size: Normal)   Pulse 60   Resp 16   Ht '6\' 1"'$  (1.854 m)   Wt 218 lb (98.9 kg)   SpO2 97%   BMI 28.76 kg/m      PHYSICAL EXAM  Physical Exam  Constitutional: He is oriented to person, place, and time. He appears well-developed and well-nourished. No distress.  HENT:  Head: Normocephalic and atraumatic.  Mouth/Throat: No oropharyngeal exudate.  Eyes: EOM are normal. Pupils are equal, round, and reactive to light. No scleral icterus.  Neck: Normal range of motion. Neck supple.  Cardiovascular: Normal rate, regular rhythm and normal heart sounds.   No murmur heard. Pulmonary/Chest: No stridor. No respiratory distress. He has no wheezes.  Abdominal: Soft. Bowel sounds are normal.  Musculoskeletal: Normal range of motion. He exhibits  no edema.  Neurological: He is alert and oriented to person, place, and time. No cranial nerve deficit.  Skin: Skin is warm. He is not diaphoretic.  Psychiatric: He has a normal mood and affect.             IMAGING    Ct Chest Wo Contrast  Result Date: 03/05/2017 CLINICAL DATA:  Renal cell carcinoma with lung metastases. EXAM: CT CHEST WITHOUT CONTRAST TECHNIQUE: Multidetector CT imaging of the chest was performed following the standard protocol without IV contrast. COMPARISON:  01/08/2017 FINDINGS: Cardiovascular: The heart size is normal. No pericardial effusion. Coronary artery calcification is noted. Mediastinum/Nodes: 17 mm left thyroid nodule, stable. No mediastinal lymphadenopathy. No evidence for gross hilar lymphadenopathy although assessment is limited by the lack of intravenous contrast on today's study. The esophagus has normal imaging features. There is no axillary lymphadenopathy. Lungs/Pleura: Emphysema. Bilateral pulmonary nodules again identified without substantial change. Index nodule in the left apex (image 32) is 13 x 17 mm today compared to 13 x 17 mm previously. Central left upper lobe lesions seen image 59 is 13 x 15 mm today compared to 13 x 16 mm previously. 8 mm right upper lobe nodule seen image BD is unchanged from 8 mm previously. Index right lower lobe nodule seen image 119 is 12 x 19 mm today compared to 14 x 19 mm previously. Finally, a medial right lower lobe nodule seen image 127 is 12 x 17 mm today which compares to 14 x 15 mm previously. No new pulmonary nodule identified. No focal airspace consolidation. No pulmonary edema or pleural effusion. Upper Abdomen: Unremarkable. Musculoskeletal: Bone windows reveal no worrisome lytic or sclerotic osseous lesions. IMPRESSION: 1. No substantial interval change. Multiple bilateral pulmonary nodules, as seen previously without appreciable trend for worsening or improvement. 2. Emphysema. 3. Stable left thyroid nodule.  Electronically Signed   By: Misty Stanley M.D.   On: 03/05/2017 11:20   CTc hest 03/05/17 I have Independently reviewed images of  CT chest   on  03/18/2017 Interpretation:b/l lung nodules, stable in appearance from previous scan Areas of emphysema    ASSESSMENT/PLAN   76 yo pleasant white male with chronic cough most likely post viral infection that can last up to 4 weeks, with underlying mild COPD gold stage A with stable bilateral pulmonary nodules from metastatic renal cell cancer.    1.continue Immunotherapy and follow up with Oncology 2.continue therapy for mild COPD with advair, albuterol as needed 3.check ONO and 6WMT 4.repeat CT scans per oncology  Follow up in 6 months Patient/Family are satisfied with Plan of action and management. All questions answered  Corrin Parker, M.D.  [facility]  Medical Director [place] Director [facility] Cardio-Pulmonary Department

## 2017-03-18 NOTE — Addendum Note (Signed)
Addended by: Devona Konig on: 03/18/2017 04:14 PM   Modules accepted: Orders

## 2017-03-18 NOTE — Patient Instructions (Signed)
Continue advair as prescribed Albuterol as needed

## 2017-03-20 ENCOUNTER — Ambulatory Visit (INDEPENDENT_AMBULATORY_CARE_PROVIDER_SITE_OTHER): Payer: PPO | Admitting: *Deleted

## 2017-03-20 DIAGNOSIS — R0602 Shortness of breath: Secondary | ICD-10-CM | POA: Diagnosis not present

## 2017-03-20 NOTE — Progress Notes (Signed)
SIX MIN WALK 03/20/2017  Medications Albuterol 108; Amlodipine 5 mg; Plavix 75 mg; Losartan 75 mg; Metoprolol 50 mg; Sertraline 100 mg;Tamsulosin 0.4 mg  Supplimental Oxygen during Test? (L/min) No  Laps 8  Partial Lap (in Meters) 3  Baseline BP (sitting) 123/80  Baseline Heartrate 63  Baseline Dyspnea (Borg Scale) 0  Baseline Fatigue (Borg Scale) 0  Baseline SPO2 99  BP (sitting) 140/80  Heartrate 75  Dyspnea (Borg Scale) 1  Fatigue (Borg Scale) 1  SPO2 95  BP (sitting) 130/68  Heartrate 64  SPO2 96  Stopped or Paused before Six Minutes No  Distance Completed 387  Tech Comments: Patient walked at very brisk walk, while able to carry on conversation.      SMW performed today.

## 2017-03-21 ENCOUNTER — Inpatient Hospital Stay: Payer: PPO

## 2017-03-21 ENCOUNTER — Inpatient Hospital Stay (HOSPITAL_BASED_OUTPATIENT_CLINIC_OR_DEPARTMENT_OTHER): Payer: PPO | Admitting: Internal Medicine

## 2017-03-21 ENCOUNTER — Inpatient Hospital Stay: Payer: PPO | Attending: Internal Medicine

## 2017-03-21 VITALS — BP 128/74 | HR 64 | Temp 97.8°F | Resp 18 | Ht 73.0 in | Wt 220.0 lb

## 2017-03-21 DIAGNOSIS — E041 Nontoxic single thyroid nodule: Secondary | ICD-10-CM

## 2017-03-21 DIAGNOSIS — I2510 Atherosclerotic heart disease of native coronary artery without angina pectoris: Secondary | ICD-10-CM | POA: Diagnosis not present

## 2017-03-21 DIAGNOSIS — R05 Cough: Secondary | ICD-10-CM | POA: Diagnosis not present

## 2017-03-21 DIAGNOSIS — R51 Headache: Secondary | ICD-10-CM | POA: Diagnosis not present

## 2017-03-21 DIAGNOSIS — E039 Hypothyroidism, unspecified: Secondary | ICD-10-CM | POA: Insufficient documentation

## 2017-03-21 DIAGNOSIS — C7802 Secondary malignant neoplasm of left lung: Secondary | ICD-10-CM | POA: Diagnosis not present

## 2017-03-21 DIAGNOSIS — Z79899 Other long term (current) drug therapy: Secondary | ICD-10-CM | POA: Diagnosis not present

## 2017-03-21 DIAGNOSIS — Z85820 Personal history of malignant melanoma of skin: Secondary | ICD-10-CM | POA: Insufficient documentation

## 2017-03-21 DIAGNOSIS — C7801 Secondary malignant neoplasm of right lung: Secondary | ICD-10-CM | POA: Insufficient documentation

## 2017-03-21 DIAGNOSIS — C642 Malignant neoplasm of left kidney, except renal pelvis: Secondary | ICD-10-CM

## 2017-03-21 DIAGNOSIS — Z87442 Personal history of urinary calculi: Secondary | ICD-10-CM

## 2017-03-21 DIAGNOSIS — Z7982 Long term (current) use of aspirin: Secondary | ICD-10-CM | POA: Insufficient documentation

## 2017-03-21 DIAGNOSIS — Z87891 Personal history of nicotine dependence: Secondary | ICD-10-CM | POA: Insufficient documentation

## 2017-03-21 DIAGNOSIS — N189 Chronic kidney disease, unspecified: Secondary | ICD-10-CM | POA: Insufficient documentation

## 2017-03-21 DIAGNOSIS — Z5112 Encounter for antineoplastic immunotherapy: Secondary | ICD-10-CM | POA: Diagnosis not present

## 2017-03-21 DIAGNOSIS — I129 Hypertensive chronic kidney disease with stage 1 through stage 4 chronic kidney disease, or unspecified chronic kidney disease: Secondary | ICD-10-CM

## 2017-03-21 LAB — CBC WITH DIFFERENTIAL/PLATELET
BASOS ABS: 0 10*3/uL (ref 0–0.1)
BASOS PCT: 0 %
EOS ABS: 0 10*3/uL (ref 0–0.7)
Eosinophils Relative: 0 %
HCT: 34.7 % — ABNORMAL LOW (ref 40.0–52.0)
HEMOGLOBIN: 12 g/dL — AB (ref 13.0–18.0)
Lymphocytes Relative: 7 %
Lymphs Abs: 0.7 10*3/uL — ABNORMAL LOW (ref 1.0–3.6)
MCH: 27.5 pg (ref 26.0–34.0)
MCHC: 34.5 g/dL (ref 32.0–36.0)
MCV: 79.8 fL — ABNORMAL LOW (ref 80.0–100.0)
Monocytes Absolute: 0.1 10*3/uL — ABNORMAL LOW (ref 0.2–1.0)
Monocytes Relative: 1 %
NEUTROS ABS: 9.2 10*3/uL — AB (ref 1.4–6.5)
NEUTROS PCT: 92 %
Platelets: 168 10*3/uL (ref 150–440)
RBC: 4.35 MIL/uL — ABNORMAL LOW (ref 4.40–5.90)
RDW: 17.2 % — AB (ref 11.5–14.5)
WBC: 10 10*3/uL (ref 3.8–10.6)

## 2017-03-21 LAB — COMPREHENSIVE METABOLIC PANEL
ALBUMIN: 3.8 g/dL (ref 3.5–5.0)
ALK PHOS: 77 U/L (ref 38–126)
ALT: 26 U/L (ref 17–63)
ANION GAP: 8 (ref 5–15)
AST: 34 U/L (ref 15–41)
BUN: 23 mg/dL — AB (ref 6–20)
CO2: 23 mmol/L (ref 22–32)
Calcium: 8.9 mg/dL (ref 8.9–10.3)
Chloride: 103 mmol/L (ref 101–111)
Creatinine, Ser: 1.56 mg/dL — ABNORMAL HIGH (ref 0.61–1.24)
GFR calc Af Amer: 48 mL/min — ABNORMAL LOW (ref >60)
GFR calc non Af Amer: 42 mL/min — ABNORMAL LOW (ref >60)
GLUCOSE: 258 mg/dL — AB (ref 65–99)
POTASSIUM: 4 mmol/L (ref 3.5–5.1)
SODIUM: 134 mmol/L — AB (ref 135–145)
Total Bilirubin: 0.7 mg/dL (ref 0.3–1.2)
Total Protein: 6.7 g/dL (ref 6.5–8.1)

## 2017-03-21 MED ORDER — SODIUM CHLORIDE 0.9 % IV SOLN
Freq: Once | INTRAVENOUS | Status: AC
  Administered 2017-03-21: 10:00:00 via INTRAVENOUS
  Filled 2017-03-21: qty 1000

## 2017-03-21 MED ORDER — SODIUM CHLORIDE 0.9 % IV SOLN
240.0000 mg | Freq: Once | INTRAVENOUS | Status: AC
  Administered 2017-03-21: 240 mg via INTRAVENOUS
  Filled 2017-03-21: qty 24

## 2017-03-21 NOTE — Progress Notes (Signed)
[place] OFFICE PROGRESS NOTE  Patient Care Team: Dion Body, MD as PCP - General (Family Medicine) Jannet Mantis, MD (Dermatology) Robert Bellow, MD (General Surgery) Hollice Espy, MD as Consulting Physician (Urology)  No matching staging information was found for the patient.   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   In the interim has met with pulmonary for his refractory cough.    patient states his cough is improved.  Appetite is good.  Denies any shortness of breath or chest pain No chronic diarrhea.No pain anywhere else.No weight loss.  Overall  he feels well.  REVIEW OF SYSTEMS:  A complete 10 point review of system is done which is negative except mentioned above/history of present illness.   PAST MEDICAL HISTORY :  Past Medical History:  Diagnosis Date  . CAD (coronary artery disease)   . Cancer ([place])    left arm  . Hypertension   . Kidney stone   . Lung cancer ([place])   . Melanoma ([place]) 01/24/2016   right shoulder  . Thyroid nodule 02/02/2016   left BENIGN THYROID NODULE by FNA    PAST SURGICAL HISTORY :   Past Surgical History:  Procedure  Laterality Date  . arm surgery Left    arm  . cardiac stents  2011   Angioplasty / Stenting Femoral-X2  . COLONOSCOPY  04/01/12  . CORONARY ANGIOPLASTY    . EXCISION MELANOMA WITH SENTINEL LYMPH NODE BIOPSY Right 01/24/2016   Procedure: EXCISION MELANOMA Right Shoulder;  Surgeon: Robert Bellow, MD;  Location: [facility] ORS;  Service: General;  Laterality: Right;  . LAPAROSCOPIC NEPHRECTOMY, HAND ASSISTED Left 04/24/2016   Procedure: HAND ASSISTED LAPAROSCOPIC NEPHRECTOMY;  Surgeon: Hollice Espy, MD;  Location: [facility] ORS;  Service: Urology;  Laterality: Left;  Marland Kitchen VIDEO ASSISTED THORACOSCOPY (VATS)/THOROCOTOMY Left 03/08/2016   Procedure: VIDEO ASSISTED THORACOSCOPY (VATS) with lung biopsy - Left ;  Surgeon: Robert Bellow, MD;  Location: [facility] ORS;  Service: General;  Laterality: Left;    FAMILY HISTORY :   Family History  Problem Relation Age of Onset  . Hodgkin's lymphoma Daughter 5  . Kidney cancer Neg Hx   . Kidney disease Neg Hx   . Prostate cancer Neg Hx     SOCIAL HISTORY:   Social History  Substance Use Topics  . Smoking status: Former Smoker    Types: Cigarettes    Start date: 01/18/1956    Quit date: 01/17/1969  . Smokeless tobacco: Never Used  . Alcohol use No     Comment: BEER OCC    ALLERGIES:  is allergic to lisinopril and other.  MEDICATIONS:  Current Outpatient Prescriptions  Medication Sig Dispense  Refill  . albuterol (PROVENTIL HFA;VENTOLIN HFA) 108 (90 Base) MCG/ACT inhaler Inhale 2 puffs into the lungs every 6 (six) hours as needed for wheezing or shortness of breath. 1 Inhaler 2  . amLODipine (NORVASC) 5 MG tablet TAKE 1 TABLET (5 MG TOTAL) BY MOUTH ONCE DAILY.  0  . aspirin EC 81 MG tablet Take 81 mg by mouth daily.    Marland Kitchen atorvastatin (LIPITOR) 40 MG tablet Take 40 mg by mouth daily.    . clopidogrel (PLAVIX) 75 MG tablet Take 75 mg by mouth daily.    Marland Kitchen docusate sodium (COLACE) 100 MG capsule TAKE 1 CAPSULE BY MOUTH TWICE A DAY 60 capsule 3  .  Fluticasone-Salmeterol (ADVAIR DISKUS) 500-50 MCG/DOSE AEPB Inhale 1 puff into the lungs 2 (two) times daily. 1 each 3  . loratadine (CLARITIN) 10 MG tablet Take 10 mg by mouth daily.    Marland Kitchen losartan (COZAAR) 50 MG tablet Take 50 mg by mouth every morning.     Marland Kitchen MELATONIN PO Take 10 mg by mouth at bedtime.     . metoprolol succinate (TOPROL-XL) 50 MG 24 hr tablet Take 50 mg by mouth every morning.     . sertraline (ZOLOFT) 100 MG tablet Take 200 mg by mouth daily.     . tamsulosin (FLOMAX) 0.4 MG CAPS capsule Take 0.4 mg by mouth every morning.     Marland Kitchen acetaminophen (TYLENOL) 500 MG tablet Take 500 mg by mouth every 6 (six) hours as needed for moderate pain.    . magic mouthwash w/lidocaine SOLN Take 5 mLs by mouth 4 (four) times daily. (Patient not taking: Reported on 03/21/2017) 480 mL 3  . nitroGLYCERIN (NITROSTAT) 0.4 MG SL tablet Place 0.4 mg under the tongue every 5 (five) minutes as needed.      No current facility-administered medications for this visit.     PHYSICAL EXAMINATION: ECOG PERFORMANCE STATUS: 0 - Asymptomatic  BP 128/74 (BP Location: Left Arm, Patient Position: Sitting)   Pulse 64   Temp 97.8 F (36.6 C) (Tympanic)   Resp 18   Ht _0  (1.854 m)   Wt 220 lb (99.8 kg)   BMI 29.03 kg/m   Filed Weights   03/21/17 0840  Weight: 220 lb (99.8 kg)    GENERAL: Well-nourished well-developed; Alert, no distress and comfortable. Accompanied by his daughter; wife.  EYES: no pallor or icterus OROPHARYNX: no thrush or ulceration; NECK: supple, no masses felt LYMPH:  no palpable lymphadenopathy in the cervical, axillary or inguinal regions LUNGS: clear to auscultation and  No wheeze or crackles HEART/CVS: regular rate & rhythm and no murmurs; No lower extremity edema ABDOMEN:abdomen soft, non-tender and normal bowel sounds Musculoskeletal:no cyanosis of digits and no clubbing  PSYCH: alert & oriented x 3 with fluent speech NEURO: no focal motor/sensory deficits SKIN:  no  rashes or significant lesions.   LABORATORY DATA:  I have reviewed the data as listed    Component Value Date/Time   NA 134 (L) 03/21/2017 0822   NA 146 (H) 05/25/2016 1514   K 4.0 03/21/2017 0822   CL 103 03/21/2017 0822   CO2 23 03/21/2017 0822   GLUCOSE 258 (H) 03/21/2017 0822   BUN 23 (H) 03/21/2017 0822   BUN 19 05/25/2016 1514   CREATININE 1.56 (H) 03/21/2017 0822   CALCIUM 8.9 03/21/2017 0822   PROT 6.7 03/21/2017 0822   ALBUMIN 3.8 03/21/2017 0822   AST 34 03/21/2017 0822   ALT 26 03/21/2017 8372  ALKPHOS 77 03/21/2017 0822   BILITOT 0.7 03/21/2017 0822   GFRNONAA 42 (L) 03/21/2017 0822   GFRAA 48 (L) 03/21/2017 0822    No results found for: SPEP, UPEP  Lab Results  Component Value Date   WBC 10.0 03/21/2017   NEUTROABS 9.2 (H) 03/21/2017   HGB 12.0 (L) 03/21/2017   HCT 34.7 (L) 03/21/2017   MCV 79.8 (L) 03/21/2017   PLT 168 03/21/2017      Chemistry      Component Value Date/Time   NA 134 (L) 03/21/2017 0822   NA 146 (H) 05/25/2016 1514   K 4.0 03/21/2017 0822   CL 103 03/21/2017 0822   CO2 23 03/21/2017 0822   BUN 23 (H) 03/21/2017 0822   BUN 19 05/25/2016 1514   CREATININE 1.56 (H) 03/21/2017 0822      Component Value Date/Time   CALCIUM 8.9 03/21/2017 0822   ALKPHOS 77 03/21/2017 0822   AST 34 03/21/2017 0822   ALT 26 03/21/2017 0822   BILITOT 0.7 03/21/2017 1224     RADIOGRAPHIC STUDIES: I have personally reviewed the radiological images as listed and agreed with the findings in the report. No results found.  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 Stage IV; April 16th  20th CT lung- STABLE  Bil lung nodules approximately 1-2 cm in size. [compared to feb 2017]; discussed the rationale for continuing the opdivo.    # Currently on OPDIVO; proceed with cycle # 15 today. Labs today reviewed;  acceptable for treatment  today.   # hypothyroidism- subclinical  [Oct 2017-TSH 5];  thyroid profile from today- pending.     # Cough- ? Post-viral improved; s/p anti-histamine; abuterol inhaler; add advair; reviewed the note from [place].   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   # follow up in 2 weeks/labs/ opdivo; 4 weeks/infusion.   No orders of the defined types were placed in this encounter.     Cammie Sickle, MD 03/22/2017 6:13 PM

## 2017-03-21 NOTE — Assessment & Plan Note (Signed)
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 Stage IV; April 16th  20th CT lung- STABLE  Bil lung nodules approximately 1-2 cm in size. [compared to feb 2017]; discussed the rationale for continuing the opdivo.    # Currently on OPDIVO; proceed with cycle # 15 today. Labs today reviewed;  acceptable for treatment today.   # hypothyroidism- subclinical  [Oct 2017-TSH 5];  thyroid profile from today- pending.     # Cough- ? Post-viral improved; s/p anti-histamine; abuterol inhaler; add advair; reviewed the note from [place].   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   # follow up in 2 weeks/labs/ opdivo; 4 weeks/infusion.

## 2017-03-22 LAB — THYROID PANEL WITH TSH
FREE THYROXINE INDEX: 1.1 — AB (ref 1.2–4.9)
T3 Uptake Ratio: 28 % (ref 24–39)
T4, Total: 3.8 ug/dL — ABNORMAL LOW (ref 4.5–12.0)
TSH: 1.24 u[IU]/mL (ref 0.450–4.500)

## 2017-03-31 ENCOUNTER — Other Ambulatory Visit: Payer: Self-pay | Admitting: Internal Medicine

## 2017-04-01 ENCOUNTER — Other Ambulatory Visit: Payer: Self-pay | Admitting: Internal Medicine

## 2017-04-01 ENCOUNTER — Telehealth: Payer: Self-pay | Admitting: *Deleted

## 2017-04-01 ENCOUNTER — Other Ambulatory Visit: Payer: Self-pay | Admitting: *Deleted

## 2017-04-01 ENCOUNTER — Ambulatory Visit
Admission: RE | Admit: 2017-04-01 | Discharge: 2017-04-01 | Disposition: A | Discharge status: Home / Self Care | Payer: PPO | Source: Ambulatory Visit | Attending: Internal Medicine | Admitting: Internal Medicine

## 2017-04-01 ENCOUNTER — Telehealth: Payer: Self-pay | Admitting: Internal Medicine

## 2017-04-01 ENCOUNTER — Ambulatory Visit
Admission: AD | Admit: 2017-04-01 | Discharge: 2017-04-01 | Disposition: A | Discharge status: Home / Self Care | Payer: PPO | Source: Ambulatory Visit | Attending: Internal Medicine | Admitting: Internal Medicine

## 2017-04-01 DIAGNOSIS — C642 Malignant neoplasm of left kidney, except renal pelvis: Secondary | ICD-10-CM

## 2017-04-01 DIAGNOSIS — R059 Cough, unspecified: Secondary | ICD-10-CM

## 2017-04-01 DIAGNOSIS — R509 Fever, unspecified: Secondary | ICD-10-CM

## 2017-04-01 DIAGNOSIS — R911 Solitary pulmonary nodule: Secondary | ICD-10-CM | POA: Insufficient documentation

## 2017-04-01 DIAGNOSIS — R05 Cough: Secondary | ICD-10-CM | POA: Diagnosis not present

## 2017-04-01 NOTE — Telephone Encounter (Signed)
Please advise on previous message.  

## 2017-04-01 NOTE — Telephone Encounter (Signed)
Beverlee Nims, patient's wife calling stating since Thursday patient has been having this really bad cough  States he was on Prednisone, but finished that last week, right around the time he got the cough again Would like to know if they can either come in or see what we can do for patient . The cough is not getting any better

## 2017-04-01 NOTE — Telephone Encounter (Signed)
Schedule with DK in next day or two  Merton Border, MD PCCM service Mobile [phone] Pager [phone] 04/01/2017 4:00 PM

## 2017-04-01 NOTE — Telephone Encounter (Signed)
RN and MD spoke with patient regarding test results from CXR. No evidence of pneumonia. Pt still reports cough. md would like to further evaluate pt with a chest ct to r/o pneumonia. rx sent to patient's pharmacy for prednisone renewal per md order. Pt gave verbal understanding of the plan of care.

## 2017-04-01 NOTE — Telephone Encounter (Signed)
-----   Message from Shawnee Knapp, RN sent at 04/01/2017 10:12 AM EDT ----- Regarding: SYMPTOMATIC Patient's wife called on behalf of the patient.  He has non productive cough, not getting OOB, low grade temp of 101 since Thursday.  Decreased appetite.  No N/V/D.  Please call to advise.

## 2017-04-01 NOTE — Telephone Encounter (Signed)
Dr. B spoke with patient- please sch. Patient for CT CHEST HIGH RESOLUTION  As soon as possible.  Colette, pls let pt know when apt is scheduled.

## 2017-04-01 NOTE — Telephone Encounter (Signed)
Spoke with pt and he will be here at 8am in morning. Nothing further needed.

## 2017-04-01 NOTE — Telephone Encounter (Signed)
Spoke with Dr. Rogue Bussing- patient needs a stat chest-xray-2 view.  Returned phone call to patient. Left vm on pt's cell phone. Reached patient at home phone. Patient informed to obtained xray today. He gave verbal understanding of the plan of care.

## 2017-04-02 ENCOUNTER — Other Ambulatory Visit: Payer: Self-pay | Admitting: *Deleted

## 2017-04-02 ENCOUNTER — Ambulatory Visit: Payer: PPO | Admitting: Internal Medicine

## 2017-04-02 ENCOUNTER — Ambulatory Visit
Admission: RE | Admit: 2017-04-02 | Discharge: 2017-04-02 | Disposition: A | Discharge status: Home / Self Care | Payer: PPO | Source: Ambulatory Visit | Attending: Internal Medicine | Admitting: Internal Medicine

## 2017-04-02 DIAGNOSIS — C3490 Malignant neoplasm of unspecified part of unspecified bronchus or lung: Secondary | ICD-10-CM | POA: Diagnosis not present

## 2017-04-02 DIAGNOSIS — J439 Emphysema, unspecified: Secondary | ICD-10-CM | POA: Insufficient documentation

## 2017-04-02 DIAGNOSIS — C7800 Secondary malignant neoplasm of unspecified lung: Secondary | ICD-10-CM | POA: Insufficient documentation

## 2017-04-02 DIAGNOSIS — I700 Atherosclerosis of aorta: Secondary | ICD-10-CM | POA: Diagnosis not present

## 2017-04-02 DIAGNOSIS — R05 Cough: Secondary | ICD-10-CM

## 2017-04-02 DIAGNOSIS — R059 Cough, unspecified: Secondary | ICD-10-CM

## 2017-04-02 DIAGNOSIS — C642 Malignant neoplasm of left kidney, except renal pelvis: Secondary | ICD-10-CM | POA: Diagnosis not present

## 2017-04-02 DIAGNOSIS — I2510 Atherosclerotic heart disease of native coronary artery without angina pectoris: Secondary | ICD-10-CM | POA: Diagnosis not present

## 2017-04-03 DIAGNOSIS — D485 Neoplasm of uncertain behavior of skin: Secondary | ICD-10-CM | POA: Diagnosis not present

## 2017-04-03 DIAGNOSIS — C44622 Squamous cell carcinoma of skin of right upper limb, including shoulder: Secondary | ICD-10-CM | POA: Diagnosis not present

## 2017-04-04 ENCOUNTER — Encounter: Payer: Self-pay | Admitting: *Deleted

## 2017-04-04 ENCOUNTER — Inpatient Hospital Stay: Payer: PPO

## 2017-04-04 ENCOUNTER — Inpatient Hospital Stay (HOSPITAL_BASED_OUTPATIENT_CLINIC_OR_DEPARTMENT_OTHER): Payer: PPO | Admitting: Internal Medicine

## 2017-04-04 VITALS — BP 134/70 | HR 60 | Temp 97.8°F | Resp 18 | Ht 73.0 in | Wt 223.0 lb

## 2017-04-04 DIAGNOSIS — N189 Chronic kidney disease, unspecified: Secondary | ICD-10-CM | POA: Diagnosis not present

## 2017-04-04 DIAGNOSIS — Z79899 Other long term (current) drug therapy: Secondary | ICD-10-CM

## 2017-04-04 DIAGNOSIS — I2510 Atherosclerotic heart disease of native coronary artery without angina pectoris: Secondary | ICD-10-CM | POA: Diagnosis not present

## 2017-04-04 DIAGNOSIS — R51 Headache: Secondary | ICD-10-CM | POA: Diagnosis not present

## 2017-04-04 DIAGNOSIS — C7802 Secondary malignant neoplasm of left lung: Secondary | ICD-10-CM | POA: Diagnosis not present

## 2017-04-04 DIAGNOSIS — Z87442 Personal history of urinary calculi: Secondary | ICD-10-CM | POA: Diagnosis not present

## 2017-04-04 DIAGNOSIS — Z87891 Personal history of nicotine dependence: Secondary | ICD-10-CM

## 2017-04-04 DIAGNOSIS — C642 Malignant neoplasm of left kidney, except renal pelvis: Secondary | ICD-10-CM

## 2017-04-04 DIAGNOSIS — Z7982 Long term (current) use of aspirin: Secondary | ICD-10-CM

## 2017-04-04 DIAGNOSIS — Z85820 Personal history of malignant melanoma of skin: Secondary | ICD-10-CM | POA: Diagnosis not present

## 2017-04-04 DIAGNOSIS — E039 Hypothyroidism, unspecified: Secondary | ICD-10-CM | POA: Diagnosis not present

## 2017-04-04 DIAGNOSIS — E041 Nontoxic single thyroid nodule: Secondary | ICD-10-CM | POA: Diagnosis not present

## 2017-04-04 DIAGNOSIS — R05 Cough: Secondary | ICD-10-CM | POA: Diagnosis not present

## 2017-04-04 DIAGNOSIS — Z5112 Encounter for antineoplastic immunotherapy: Secondary | ICD-10-CM | POA: Diagnosis not present

## 2017-04-04 DIAGNOSIS — C7801 Secondary malignant neoplasm of right lung: Secondary | ICD-10-CM

## 2017-04-04 DIAGNOSIS — I129 Hypertensive chronic kidney disease with stage 1 through stage 4 chronic kidney disease, or unspecified chronic kidney disease: Secondary | ICD-10-CM

## 2017-04-04 LAB — CBC WITH DIFFERENTIAL/PLATELET
BASOS ABS: 0 10*3/uL (ref 0–0.1)
Basophils Relative: 0 %
EOS PCT: 0 %
Eosinophils Absolute: 0 10*3/uL (ref 0–0.7)
HEMATOCRIT: 32.5 % — AB (ref 40.0–52.0)
Hemoglobin: 11.2 g/dL — ABNORMAL LOW (ref 13.0–18.0)
LYMPHS ABS: 0.6 10*3/uL — AB (ref 1.0–3.6)
LYMPHS PCT: 7 %
MCH: 27.4 pg (ref 26.0–34.0)
MCHC: 34.5 g/dL (ref 32.0–36.0)
MCV: 79.5 fL — AB (ref 80.0–100.0)
MONO ABS: 0.3 10*3/uL (ref 0.2–1.0)
MONOS PCT: 3 %
NEUTROS ABS: 7.9 10*3/uL — AB (ref 1.4–6.5)
Neutrophils Relative %: 90 %
PLATELETS: 162 10*3/uL (ref 150–440)
RBC: 4.08 MIL/uL — ABNORMAL LOW (ref 4.40–5.90)
RDW: 17.5 % — AB (ref 11.5–14.5)
WBC: 8.8 10*3/uL (ref 3.8–10.6)

## 2017-04-04 LAB — COMPREHENSIVE METABOLIC PANEL
ALT: 24 U/L (ref 17–63)
ANION GAP: 6 (ref 5–15)
AST: 28 U/L (ref 15–41)
Albumin: 3.7 g/dL (ref 3.5–5.0)
Alkaline Phosphatase: 68 U/L (ref 38–126)
BILIRUBIN TOTAL: 0.6 mg/dL (ref 0.3–1.2)
BUN: 25 mg/dL — AB (ref 6–20)
CHLORIDE: 103 mmol/L (ref 101–111)
CO2: 25 mmol/L (ref 22–32)
Calcium: 9.1 mg/dL (ref 8.9–10.3)
Creatinine, Ser: 1.47 mg/dL — ABNORMAL HIGH (ref 0.61–1.24)
GFR, EST AFRICAN AMERICAN: 52 mL/min — AB (ref >60)
GFR, EST NON AFRICAN AMERICAN: 45 mL/min — AB (ref >60)
Glucose, Bld: 269 mg/dL — ABNORMAL HIGH (ref 65–99)
POTASSIUM: 4 mmol/L (ref 3.5–5.1)
Sodium: 134 mmol/L — ABNORMAL LOW (ref 135–145)
TOTAL PROTEIN: 6.9 g/dL (ref 6.5–8.1)

## 2017-04-04 LAB — IRON AND TIBC
Iron: 26 ug/dL — ABNORMAL LOW (ref 45–182)
SATURATION RATIOS: 6 % — AB (ref 17.9–39.5)
TIBC: 405 ug/dL (ref 250–450)
UIBC: 379 ug/dL

## 2017-04-04 LAB — FERRITIN: FERRITIN: 18 ng/mL — AB (ref 24–336)

## 2017-04-04 MED ORDER — IPRATROPIUM-ALBUTEROL 0.5-2.5 (3) MG/3ML IN SOLN: 3.0000 mL | RESPIRATORY_TRACT | 3 refills | Status: AC | PRN

## 2017-04-04 MED ORDER — CABOZANTINIB S-MALATE 60 MG PO TABS: 60.0000 mg | ORAL_TABLET | Freq: Every day | ORAL | 3 refills | Status: DC

## 2017-04-04 NOTE — Progress Notes (Signed)
Nutrition  Patient identified on Malnutrition Screening Report for poor appetite and weight loss. Was planning to see patient today during infusion but cancelled for today.  Chart reviewed and noted increase in weight from 218 lb (4/30) to 223 lb today. Noted good appetite per MD noted.  Will hold off on nutrition assessment at this time.  Elhadji Pecore B. Zenia Resides, [place], [place] Registered Dietitian [phone] (pager)

## 2017-04-04 NOTE — Assessment & Plan Note (Addendum)
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 Stage IV; currently on Opdivo s/p 14 cycles. April 16th  20th CT lung- STABLE  Bil lung nodules approximately 1-2 cm in size. [compared to feb 2017]. Patient seems to be tolerating well- except for severe cough [C discussion below]. 04/03/2017 high-resolution CT scan- shows stable lung nodules no evidence of pneumonitis or interstitial thickening to explain his recurrent cough.   # Given the temporal relationship with Opdivo infusion- cough few days later; improvement with steroids; even though in the absence of pneumonitis changes on 2 CT scans- I would recommend discontinuation of opdivo at this time.  # Recommend starting Cabometyx at 60 mg/day. Discussed the potential side effects including but not limited to diarrhea elevated blood pressure hand-foot syndrome. Again understands the treatments are palliative.  # hypothyroidism- subclinical  [Oct 2017-TSH 5]; MAY 2018- Normal.  # Anemia- mild- 11; MCV 76- check Iorn studies/ ferritin today.     # Cough/COPD- unclear etiology; question Opdivo versus others. Also discussed with Dr.Kasa. Also ordered duonebs; nebs. On prednisone taper. Continue Claritin.  # CKD creat- 1.4 /solitary kidney; stable.   # follow up with X-MD in 3 weeks [ symptom manaement]; follow up with me in 8 weeks/labs.   # I reviewed the blood work- with the patient in detail; also reviewed the imaging independently [as summarized above]; and with the patient in detail.    Addendum: Iron studies- suggestive of iron deficiency; recommend stool occult 3; UA. Recommend starting iron pill once a day.

## 2017-04-04 NOTE — Progress Notes (Signed)
[place] OFFICE PROGRESS NOTE  Patient Care Team: Dion Body, MD as PCP - General (Family Medicine) Jannet Mantis, MD (Dermatology) Bary Castilla Forest Gleason, MD (General Surgery) Hollice Espy, MD as Consulting Physician (Urology)  No matching staging information was found for the patient.   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   2 days after the infusion patient started to have cough- very severe dry no hemoptysis. "" Head hurts". He is currently started on steroids prednisone 60 mg a day. Some improvement. Not resolved. Patient states to have significant shortness of breath with exertion.   Appetite is good. No chronic diarrhea.No pain anywhere else.No weight loss.   REVIEW OF SYSTEMS:  A complete 10 point review of system is done which is negative except mentioned above/history of present illness.   PAST MEDICAL HISTORY :  Past Medical History:  Diagnosis Date  . CAD (coronary artery disease)   . Cancer ([place])    left arm  . COPD (chronic obstructive  pulmonary disease) ([place])   . Hypertension   . Kidney stone   . Lung cancer ([place])   . Melanoma ([place]) 01/24/2016   right shoulder  . Thyroid nodule 02/02/2016   left BENIGN THYROID NODULE by FNA    PAST SURGICAL HISTORY :   Past Surgical History:  Procedure Laterality Date  . arm surgery Left    arm  . cardiac stents  2011   Angioplasty / Stenting Femoral-X2  . COLONOSCOPY  04/01/12  . CORONARY ANGIOPLASTY    . EXCISION MELANOMA WITH SENTINEL LYMPH NODE BIOPSY Right 01/24/2016   Procedure: EXCISION MELANOMA Right Shoulder;  Surgeon: Robert Bellow, MD;  Location: [facility] ORS;  Service: General;  Laterality: Right;  . LAPAROSCOPIC NEPHRECTOMY, HAND ASSISTED Left 04/24/2016   Procedure: HAND ASSISTED LAPAROSCOPIC NEPHRECTOMY;  Surgeon: Hollice Espy, MD;  Location: [facility] ORS;  Service: Urology;  Laterality: Left;  Marland Kitchen VIDEO ASSISTED THORACOSCOPY (VATS)/THOROCOTOMY Left 03/08/2016   Procedure: VIDEO ASSISTED THORACOSCOPY (VATS) with lung biopsy - Left ;  Surgeon: Robert Bellow, MD;  Location: [facility] ORS;  Service: General;  Laterality: Left;    FAMILY HISTORY :   Family History  Problem Relation Age of Onset  . Hodgkin's lymphoma Daughter 5  . Kidney cancer Neg Hx   . Kidney disease Neg Hx   . Prostate cancer Neg Hx     SOCIAL HISTORY:   Social History  Substance Use Topics  . Smoking status: Former Smoker    Types: Cigarettes    Start date: 01/18/1956    Quit date: 01/17/1969  .  Smokeless tobacco: Never Used  . Alcohol use No     Comment: BEER OCC    ALLERGIES:  is allergic to lisinopril and other.  MEDICATIONS:  Current Outpatient Prescriptions  Medication Sig Dispense Refill  . acetaminophen (TYLENOL) 500 MG tablet Take 500 mg by mouth every 6 (six) hours as needed for moderate pain.    Marland Kitchen albuterol (PROVENTIL HFA;VENTOLIN HFA) 108 (90 Base) MCG/ACT inhaler Inhale 2 puffs into the lungs every 6 (six) hours as needed for wheezing or shortness of breath. 1 Inhaler 2  .  amLODipine (NORVASC) 5 MG tablet TAKE 1 TABLET (5 MG TOTAL) BY MOUTH ONCE DAILY.  0  . aspirin EC 81 MG tablet Take 81 mg by mouth daily.    Marland Kitchen atorvastatin (LIPITOR) 40 MG tablet Take 40 mg by mouth daily.    . clopidogrel (PLAVIX) 75 MG tablet Take 75 mg by mouth daily.    Marland Kitchen docusate sodium (COLACE) 100 MG capsule TAKE 1 CAPSULE BY MOUTH TWICE A DAY 60 capsule 3  . Fluticasone-Salmeterol (ADVAIR DISKUS) 500-50 MCG/DOSE AEPB Inhale 1 puff into the lungs 2 (two) times daily. 1 each 3  . loratadine (CLARITIN) 10 MG tablet Take 10 mg by mouth daily.    Marland Kitchen losartan (COZAAR) 50 MG tablet Take 50 mg by mouth every morning.     Marland Kitchen MELATONIN PO Take 10 mg by mouth at bedtime.     . metoprolol succinate (TOPROL-XL) 50 MG 24 hr tablet Take 50 mg by mouth every morning.     . predniSONE (DELTASONE) 20 MG tablet TAKE 3 TABLETS BY MOUTH FOR 1 WEEK, THEN 2 TABS FOR 1 WEEK, 1 TAB FOR 1 WEEK WITH FOOD 40 tablet 0  . sertraline (ZOLOFT) 100 MG tablet Take 200 mg by mouth daily.     . tamsulosin (FLOMAX) 0.4 MG CAPS capsule Take 0.4 mg by mouth every morning.     . cabozantinib S-Malate 60 MG TABS Take 60 mg by mouth daily. Take on empty stomach [1 hour before and 2 hours after a meal] 30 tablet 3  . ipratropium-albuterol (DUONEB) 0.5-2.5 (3) MG/3ML SOLN Take 3 mLs by nebulization every 4 (four) hours as needed. 360 mL 3  . magic mouthwash w/lidocaine SOLN Take 5 mLs by mouth 4 (four) times daily. (Patient not taking: Reported on 03/21/2017) 480 mL 3  . nitroGLYCERIN (NITROSTAT) 0.4 MG SL tablet Place 0.4 mg under the tongue every 5 (five) minutes as needed.      No current facility-administered medications for this visit.     PHYSICAL EXAMINATION: ECOG PERFORMANCE STATUS: 0 - Asymptomatic  BP 134/70   Pulse 60   Temp 97.8 F (36.6 C) (Tympanic)   Resp 18   Ht '6\' 1"'$  (1.854 m)   Wt 223 lb (101.2 kg)   BMI 29.42 kg/m   Filed Weights   04/04/17 0843  Weight: 223 lb (101.2 kg)    GENERAL: Well-nourished  well-developed; Alert, no distress and comfortable. Accompanied by his daughter; wife.  EYES: no pallor or icterus OROPHARYNX: no thrush or ulceration; NECK: supple, no masses felt LYMPH:  no palpable lymphadenopathy in the cervical, axillary or inguinal regions LUNGS: clear to auscultation and  No wheeze or crackles HEART/CVS: regular rate & rhythm and no murmurs; No lower extremity edema ABDOMEN:abdomen soft, non-tender and normal bowel sounds Musculoskeletal:no cyanosis of digits and no clubbing  PSYCH: alert & oriented x 3 with fluent speech NEURO: no focal motor/sensory deficits SKIN:  no rashes  or significant lesions.   LABORATORY DATA:  I have reviewed the data as listed    Component Value Date/Time   NA 134 (L) 04/04/2017 0819   NA 146 (H) 05/25/2016 1514   K 4.0 04/04/2017 0819   CL 103 04/04/2017 0819   CO2 25 04/04/2017 0819   GLUCOSE 269 (H) 04/04/2017 0819   BUN 25 (H) 04/04/2017 0819   BUN 19 05/25/2016 1514   CREATININE 1.47 (H) 04/04/2017 0819   CALCIUM 9.1 04/04/2017 0819   PROT 6.9 04/04/2017 0819   ALBUMIN 3.7 04/04/2017 0819   AST 28 04/04/2017 0819   ALT 24 04/04/2017 0819   ALKPHOS 68 04/04/2017 0819   BILITOT 0.6 04/04/2017 0819   GFRNONAA 45 (L) 04/04/2017 0819   GFRAA 52 (L) 04/04/2017 0819    No results found for: SPEP, UPEP  Lab Results  Component Value Date   WBC 8.8 04/04/2017   NEUTROABS 7.9 (H) 04/04/2017   HGB 11.2 (L) 04/04/2017   HCT 32.5 (L) 04/04/2017   MCV 79.5 (L) 04/04/2017   PLT 162 04/04/2017      Chemistry      Component Value Date/Time   NA 134 (L) 04/04/2017 0819   NA 146 (H) 05/25/2016 1514   K 4.0 04/04/2017 0819   CL 103 04/04/2017 0819   CO2 25 04/04/2017 0819   BUN 25 (H) 04/04/2017 0819   BUN 19 05/25/2016 1514   CREATININE 1.47 (H) 04/04/2017 0819      Component Value Date/Time   CALCIUM 9.1 04/04/2017 0819   ALKPHOS 68 04/04/2017 0819   AST 28 04/04/2017 0819   ALT 24 04/04/2017 0819   BILITOT 0.6  04/04/2017 0819     RADIOGRAPHIC STUDIES: I have personally reviewed the radiological images as listed and agreed with the findings in the report. No results found.  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 Stage IV; currently on Opdivo s/p 14 cycles. April 16th  20th CT lung- STABLE  Bil lung nodules approximately 1-2 cm in size. [compared to feb 2017]. Patient seems to be tolerating well- except for severe cough [C discussion below]. 04/03/2017 high-resolution CT scan- shows stable lung nodules no evidence of pneumonitis or interstitial thickening to explain his recurrent cough.   # Given the temporal relationship with Opdivo infusion- cough few days later; improvement with steroids; even though in the absence of pneumonitis changes on 2 CT scans- I would recommend discontinuation of opdivo at this time.  # Recommend starting Cabometyx at 60 mg/day. Discussed the potential side effects including but not limited to diarrhea elevated blood pressure hand-foot syndrome. Again understands the treatments are palliative.  # hypothyroidism- subclinical  [Oct 2017-TSH 5]; MAY 2018- Normal.  # Anemia- mild- 11; MCV 76- check Iorn studies/ ferritin today.     # Cough/COPD- unclear etiology; question Opdivo versus others. Also discussed with Dr.Kasa. Also ordered duonebs; nebs. On prednisone taper. Continue Claritin.  # CKD creat- 1.4 /solitary kidney; stable.   # follow up with X-MD in 3 weeks [ symptom manaement]; follow up with me in 8 weeks/labs.   # I reviewed the blood work- with the patient in detail; also reviewed the imaging independently [as summarized above]; and with the patient in detail.    Addendum: Iron studies- suggestive of iron deficiency; recommend stool occult 3; UA. Recommend starting iron pill once a day.  Orders Placed This Encounter  Procedures  .  CBC with Differential/Platelet    Standing Status:   Future    Standing Expiration Date:   04/04/2018  . Comprehensive metabolic panel    Standing Status:   Future    Standing Expiration Date:   04/04/2018  . CBC with Differential/Platelet    Standing Status:   Future    Standing Expiration Date:   04/04/2018  . Comprehensive metabolic panel    Standing Status:   Future    Standing Expiration Date:   04/04/2018  . Ferritin    Standing Status:   Future    Number of Occurrences:   1    Standing Expiration Date:   04/04/2018  . Iron and TIBC    Standing Status:   Future    Number of Occurrences:   1    Standing Expiration Date:   04/04/2018      Cammie Sickle, MD 04/07/2017 6:01 PM

## 2017-04-04 NOTE — Patient Instructions (Signed)
Cabozantinib capsules and tablets What is this medicine? CABOZANTINIB (KA boe ZAN ti nib) is medicine that targets proteins in cancer cells and stops the cancer cell from growing. It is used to treat thyroid cancer and renal cell cancer. This medicine may be used for other purposes; ask your health care provider or pharmacist if you have questions. COMMON BRAND NAME(S): Cabometyx, COMETRIQ What should I tell my health care provider before I take this medicine? They need to know if you have any of these conditions: -bleeding disorders -high blood pressure -liver disease -recent surgery -skin conditions or sensitivity -an unusual or allergic reaction to cabozantinib, other medicines, foods, dyes, or preservatives -pregnant or trying to get pregnant -breast-feeding How should I use this medicine? Take this medicine by mouth with a glass of water. Follow the directions on the prescription label. Do not take with food. Do not cut, crush, or chew this medicine. Do not take with grapefruit juice. Take your medicine at regular intervals. Do not take it more often than directed. Do not stop taking except on your doctor's advice. Talk to your pediatrician regarding the use of this medicine in children. Special care may be needed. Overdosage: If you think you have taken too much of this medicine contact a poison control center or emergency room at once. NOTE: This medicine is only for you. Do not share this medicine with others. What if I miss a dose? If you miss a dose, take it as soon as you can. If your next dose is to be taken in less than 12 hours, then do not take the missed dose. Take the next dose at your regular time. Do not take double or extra doses. What may interact with this medicine? This medicine may interact with the following medications: -atazanavir -carbamazepine -clarithromycin -conivaptan -grapefruit  juice -indinavir -itraconazole -ketoconazole -nefazodone -nelfinavir -phenobarbital -phenytoin -rifabutin -rifampin -rifapentine -ritonavir -saquinavir -St. John's Wort -telithromycin -voriconazole This list may not describe all possible interactions. Give your health care provider a list of all the medicines, herbs, non-prescription drugs, or dietary supplements you use. Also tell them if you smoke, drink alcohol, or use illegal drugs. Some items may interact with your medicine. What should I watch for while using this medicine? This drug may make you feel generally unwell. This is not uncommon, as chemotherapy can affect healthy cells as well as cancer cells. Report any side effects. Continue your course of treatment even though you feel ill unless your doctor tells you to stop. If you are going to have surgery or any other procedures, tell your doctor you are taking this medicine. Do not become pregnant while taking this medicine or for 4 months after stopping it. Women should inform their doctor if they wish to become pregnant or think they might be pregnant. Men should not father a child while taking this medicine and for 4 months after stopping it. There is a potential for serious side effects to an unborn child. Talk to your health care professional or pharmacist for more information. Do not breast-feed an infant while taking this medicine or for 4 months after the last dose. What side effects may I notice from receiving this medicine? Side effects that you should report to your doctor or health care professional as soon as possible: -allergic reactions like skin rash, itching or hives, swelling of the face, lips, or tongue -bloody or black, tarry stools -breathing problems -changes in vision -chest pain or chest tightness -confusion, trouble speaking or understanding -feeling   faint or lightheaded, falls -jaw pain, especially after dental work -red or dark-brown urine -redness,  swelling, or sores on hands or feet -severe headaches -shortness of breath, chest pain, swelling in a leg -spitting up blood or brown material that looks like coffee grounds -stomach pain -sudden numbness or weakness of the face, arm or leg -sweating -swelling of the ankles, feet, hands -trouble swallowing -trouble walking, dizziness, loss of balance or coordination -unusual bruising or bleeding from the eye, gums, or nose Side effects that usually do not require medical attention (report to your doctor or health care professional if they continue or are bothersome): -diarrhea -loss of appetite -nausea, vomiting -sore throat -weight loss This list may not describe all possible side effects. Call your doctor for medical advice about side effects. You may report side effects to FDA at 1-800-FDA-1088. Where should I keep my medicine? Keep out of the reach of children. Store between 20 and 25 degrees C (68 and 77 degrees F). Throw away any unused medicine after the expiration date. NOTE: This sheet is a summary. It may not cover all possible information. If you have questions about this medicine, talk to your doctor, pharmacist, or health care provider.  2018 Elsevier/Gold Standard (2016-10-24 13:42:22)  

## 2017-04-04 NOTE — Progress Notes (Signed)
Faxed home health orders for nebulizer machine to [facility] at 1549 today.

## 2017-04-04 NOTE — Progress Notes (Signed)
Patient here for renal cancer follow -up. He reports ongoing cough. He is interested in a nebulizer treatment as needed for shortness of breath.   Patient education provided today in clinic for (NEW start) cabozantinib S-Malate.  Pt provided with information regarding Cabozantinib. Pt is familiar with the oral chemotherapy process as he has been treated with Votrient in the past. He gave verbal understanding that the rx would be submitted through [facility]. We reviewed that he may need copay assistance with the drug. RN reviewed medication side effects of Cabozantinib and drug/food/herbal interactions. Discussed with patient to take medication without food on an empty stomach with a full glass of water. Pt understands that he should contact the provider for concerns while on the medication. Reviewed with patient that he should take the medication at regular intervals, avoid skipping dosing and pt made aware not to double up on dosing if he should miss a dose of his medication.  Teach back process performed

## 2017-04-08 DIAGNOSIS — J449 Chronic obstructive pulmonary disease, unspecified: Secondary | ICD-10-CM | POA: Diagnosis not present

## 2017-04-09 ENCOUNTER — Other Ambulatory Visit: Payer: Self-pay | Admitting: *Deleted

## 2017-04-09 ENCOUNTER — Encounter: Payer: Self-pay | Admitting: Internal Medicine

## 2017-04-09 ENCOUNTER — Encounter: Payer: Self-pay | Admitting: *Deleted

## 2017-04-09 DIAGNOSIS — R0602 Shortness of breath: Secondary | ICD-10-CM

## 2017-04-09 DIAGNOSIS — R05 Cough: Secondary | ICD-10-CM

## 2017-04-09 DIAGNOSIS — J449 Chronic obstructive pulmonary disease, unspecified: Secondary | ICD-10-CM | POA: Diagnosis not present

## 2017-04-09 DIAGNOSIS — D509 Iron deficiency anemia, unspecified: Secondary | ICD-10-CM

## 2017-04-09 DIAGNOSIS — R059 Cough, unspecified: Secondary | ICD-10-CM

## 2017-04-10 ENCOUNTER — Telehealth: Payer: Self-pay | Admitting: *Deleted

## 2017-04-10 NOTE — Telephone Encounter (Signed)
Wants to let you know that he got a grant and his chemo pills will be delivered tomorrow. He has to complete his Prednisone first and he has an appt next Thursday with Dr Rogue Bussing so he will not start them until after seeing Dr B. Just wants you to be aware of what is happening

## 2017-04-15 ENCOUNTER — Telehealth: Payer: Self-pay | Admitting: Internal Medicine

## 2017-04-15 ENCOUNTER — Other Ambulatory Visit: Payer: Self-pay | Admitting: Internal Medicine

## 2017-04-15 MED ORDER — HYDROCOD POLST-CPM POLST ER 10-8 MG/5ML PO SUER: 5.0000 mL | Freq: Two times a day (BID) | ORAL | 0 refills | Status: DC | PRN

## 2017-04-15 MED ORDER — PREDNISONE 20 MG PO TABS: 60.0000 mg | ORAL_TABLET | Freq: Every day | ORAL | 0 refills | Status: DC

## 2017-04-15 NOTE — Telephone Encounter (Signed)
Called re: continue d cough- recommend again predn 60 mg a day; Wife will pickup prescription for Tussionex at [facility].  also asked to call pulmonary office.

## 2017-04-16 ENCOUNTER — Other Ambulatory Visit: Payer: Self-pay | Admitting: *Deleted

## 2017-04-16 DIAGNOSIS — R05 Cough: Secondary | ICD-10-CM

## 2017-04-16 DIAGNOSIS — R059 Cough, unspecified: Secondary | ICD-10-CM

## 2017-04-16 MED ORDER — HYDROCOD POLST-CPM POLST ER 10-8 MG/5ML PO SUER: 5.0000 mL | Freq: Two times a day (BID) | ORAL | 0 refills | Status: DC | PRN

## 2017-04-18 ENCOUNTER — Inpatient Hospital Stay (HOSPITAL_BASED_OUTPATIENT_CLINIC_OR_DEPARTMENT_OTHER): Payer: PPO | Admitting: Internal Medicine

## 2017-04-18 ENCOUNTER — Inpatient Hospital Stay: Payer: PPO

## 2017-04-18 ENCOUNTER — Other Ambulatory Visit: Payer: Self-pay | Admitting: Internal Medicine

## 2017-04-18 DIAGNOSIS — C642 Malignant neoplasm of left kidney, except renal pelvis: Secondary | ICD-10-CM | POA: Diagnosis not present

## 2017-04-18 DIAGNOSIS — Z87891 Personal history of nicotine dependence: Secondary | ICD-10-CM

## 2017-04-18 DIAGNOSIS — Z87442 Personal history of urinary calculi: Secondary | ICD-10-CM | POA: Diagnosis not present

## 2017-04-18 DIAGNOSIS — R51 Headache: Secondary | ICD-10-CM

## 2017-04-18 DIAGNOSIS — R05 Cough: Secondary | ICD-10-CM

## 2017-04-18 DIAGNOSIS — Z85820 Personal history of malignant melanoma of skin: Secondary | ICD-10-CM | POA: Diagnosis not present

## 2017-04-18 DIAGNOSIS — C7801 Secondary malignant neoplasm of right lung: Secondary | ICD-10-CM

## 2017-04-18 DIAGNOSIS — I2510 Atherosclerotic heart disease of native coronary artery without angina pectoris: Secondary | ICD-10-CM

## 2017-04-18 DIAGNOSIS — Z7982 Long term (current) use of aspirin: Secondary | ICD-10-CM

## 2017-04-18 DIAGNOSIS — I129 Hypertensive chronic kidney disease with stage 1 through stage 4 chronic kidney disease, or unspecified chronic kidney disease: Secondary | ICD-10-CM

## 2017-04-18 DIAGNOSIS — N189 Chronic kidney disease, unspecified: Secondary | ICD-10-CM

## 2017-04-18 DIAGNOSIS — Z5112 Encounter for antineoplastic immunotherapy: Secondary | ICD-10-CM | POA: Diagnosis not present

## 2017-04-18 DIAGNOSIS — C7802 Secondary malignant neoplasm of left lung: Secondary | ICD-10-CM

## 2017-04-18 DIAGNOSIS — Z79899 Other long term (current) drug therapy: Secondary | ICD-10-CM

## 2017-04-18 DIAGNOSIS — E041 Nontoxic single thyroid nodule: Secondary | ICD-10-CM | POA: Diagnosis not present

## 2017-04-18 DIAGNOSIS — E039 Hypothyroidism, unspecified: Secondary | ICD-10-CM | POA: Diagnosis not present

## 2017-04-18 LAB — COMPREHENSIVE METABOLIC PANEL
ALT: 19 U/L (ref 17–63)
AST: 25 U/L (ref 15–41)
Albumin: 3.5 g/dL (ref 3.5–5.0)
Alkaline Phosphatase: 71 U/L (ref 38–126)
Anion gap: 6 (ref 5–15)
BUN: 19 mg/dL (ref 6–20)
CO2: 27 mmol/L (ref 22–32)
Calcium: 8.8 mg/dL — ABNORMAL LOW (ref 8.9–10.3)
Chloride: 100 mmol/L — ABNORMAL LOW (ref 101–111)
Creatinine, Ser: 1.91 mg/dL — ABNORMAL HIGH (ref 0.61–1.24)
GFR calc Af Amer: 38 mL/min — ABNORMAL LOW (ref >60)
GFR calc non Af Amer: 33 mL/min — ABNORMAL LOW (ref >60)
Glucose, Bld: 214 mg/dL — ABNORMAL HIGH (ref 65–99)
Potassium: 3.6 mmol/L (ref 3.5–5.1)
Sodium: 133 mmol/L — ABNORMAL LOW (ref 135–145)
Total Bilirubin: 0.6 mg/dL (ref 0.3–1.2)
Total Protein: 6.6 g/dL (ref 6.5–8.1)

## 2017-04-18 LAB — CBC WITH DIFFERENTIAL/PLATELET
Basophils Absolute: 0 10*3/uL (ref 0–0.1)
Basophils Relative: 0 %
Eosinophils Absolute: 0.1 10*3/uL (ref 0–0.7)
Eosinophils Relative: 1 %
HCT: 33.3 % — ABNORMAL LOW (ref 40.0–52.0)
Hemoglobin: 11.7 g/dL — ABNORMAL LOW (ref 13.0–18.0)
Lymphocytes Relative: 11 %
Lymphs Abs: 1 10*3/uL (ref 1.0–3.6)
MCH: 28.1 pg (ref 26.0–34.0)
MCHC: 35.2 g/dL (ref 32.0–36.0)
MCV: 80 fL (ref 80.0–100.0)
Monocytes Absolute: 0.6 10*3/uL (ref 0.2–1.0)
Monocytes Relative: 7 %
Neutro Abs: 7.8 10*3/uL — ABNORMAL HIGH (ref 1.4–6.5)
Neutrophils Relative %: 81 %
Platelets: 168 10*3/uL (ref 150–440)
RBC: 4.16 MIL/uL — ABNORMAL LOW (ref 4.40–5.90)
RDW: 17.4 % — ABNORMAL HIGH (ref 11.5–14.5)
WBC: 9.6 10*3/uL (ref 3.8–10.6)

## 2017-04-18 MED ORDER — AMLODIPINE BESYLATE 5 MG PO TABS: 5.0000 mg | ORAL_TABLET | Freq: Two times a day (BID) | ORAL | 0 refills | Status: DC

## 2017-04-18 NOTE — Progress Notes (Signed)
[place] OFFICE PROGRESS NOTE  Patient Care Team: Dion Body, MD as PCP - General (Family Medicine) Jannet Mantis, MD (Dermatology) Bary Castilla Forest Gleason, MD (General Surgery) Hollice Espy, MD as Consulting Physician (Urology)  No matching staging information was found for the patient.   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 Denies any hemoptysis. Dry cough. No fevers or chills. He was started on steroids prednisone 60 mg a day; some improvement noted not significant. Mild-to-moderate shortness of breath on exertion.  Appetite is good. No chronic diarrhea.No pain anywhere else.No weight loss.   REVIEW OF SYSTEMS:  A complete 10 point review of system is done which is negative except mentioned above/history of present illness.   PAST MEDICAL HISTORY :  Past Medical History:  Diagnosis Date  . CAD (coronary artery disease)   . Cancer ([place])    left arm  . COPD (chronic obstructive pulmonary disease) ([place])   .  Hypertension   . Kidney stone   . Lung cancer ([place])   . Melanoma ([place]) 01/24/2016   right shoulder  . Thyroid nodule 02/02/2016   left BENIGN THYROID NODULE by [facility]    PAST SURGICAL HISTORY :   Past Surgical History:  Procedure Laterality Date  . arm surgery Left    arm  . cardiac stents  2011   Angioplasty / Stenting Femoral-X2  . COLONOSCOPY  04/01/12  . CORONARY ANGIOPLASTY    . EXCISION MELANOMA WITH SENTINEL LYMPH NODE BIOPSY Right 01/24/2016   Procedure: EXCISION MELANOMA Right Shoulder;  Surgeon: Robert Bellow, MD;  Location: [facility] ORS;  Service: General;  Laterality: Right;  . LAPAROSCOPIC NEPHRECTOMY, HAND ASSISTED Left 04/24/2016   Procedure: HAND ASSISTED LAPAROSCOPIC NEPHRECTOMY;  Surgeon: Hollice Espy, MD;  Location: [facility] ORS;  Service: Urology;  Laterality: Left;  Marland Kitchen VIDEO ASSISTED THORACOSCOPY (VATS)/THOROCOTOMY Left 03/08/2016   Procedure: VIDEO ASSISTED THORACOSCOPY (VATS) with lung biopsy - Left ;  Surgeon: Robert Bellow, MD;  Location: [facility] ORS;  Service: General;  Laterality: Left;    FAMILY HISTORY :   Family History  Problem Relation Age of Onset  . Hodgkin's lymphoma Daughter 5  . Kidney cancer Neg Hx   . Kidney disease Neg Hx   . Prostate cancer Neg Hx     SOCIAL HISTORY:   Social History  Substance Use Topics  . Smoking status: Former Smoker    Types: Cigarettes    Start date: 01/18/1956    Quit date: 01/17/1969  . Smokeless tobacco: Never Used  . Alcohol use  No     Comment: BEER OCC    ALLERGIES:  is allergic to lisinopril and other.  MEDICATIONS:  Current Outpatient Prescriptions  Medication Sig Dispense Refill  . acetaminophen (TYLENOL) 500 MG tablet Take 500 mg by mouth every 6 (six) hours as needed for moderate pain.    Marland Kitchen albuterol (PROVENTIL HFA;VENTOLIN HFA) 108 (90 Base) MCG/ACT inhaler Inhale 2 puffs into the lungs every 6 (six) hours as needed for wheezing or shortness of breath. 1 Inhaler 2  . amLODipine (NORVASC) 5 MG tablet Take  1 tablet (5 mg total) by mouth 2 (two) times daily. 60 tablet 0  . aspirin EC 81 MG tablet Take 81 mg by mouth daily.    Marland Kitchen atorvastatin (LIPITOR) 40 MG tablet Take 40 mg by mouth daily.    . cabozantinib S-Malate 60 MG TABS Take 60 mg by mouth daily. Take on empty stomach [1 hour before and 2 hours after a meal] 30 tablet 3  . chlorpheniramine-HYDROcodone (TUSSIONEX) 10-8 MG/5ML SUER Take 5 mLs by mouth every 12 (twelve) hours as needed for cough. 140 mL 0  . clopidogrel (PLAVIX) 75 MG tablet Take 75 mg by mouth daily.    Marland Kitchen docusate sodium (COLACE) 100 MG capsule TAKE 1 CAPSULE BY MOUTH TWICE A DAY 60 capsule 3  . Fluticasone-Salmeterol (ADVAIR DISKUS) 500-50 MCG/DOSE AEPB Inhale 1 puff into the lungs 2 (two) times daily. 1 each 3  . ipratropium-albuterol (DUONEB) 0.5-2.5 (3) MG/3ML SOLN Take 3 mLs by nebulization every 4 (four) hours as needed. 360 mL 3  . loratadine (CLARITIN) 10 MG tablet Take 10 mg by mouth daily.    Marland Kitchen losartan (COZAAR) 50 MG tablet Take 50 mg by mouth every morning.     . magic mouthwash w/lidocaine SOLN Take 5 mLs by mouth 4 (four) times daily. 480 mL 3  . MELATONIN PO Take 10 mg by mouth at bedtime.     . metoprolol succinate (TOPROL-XL) 50 MG 24 hr tablet Take 50 mg by mouth every morning.     . nitroGLYCERIN (NITROSTAT) 0.4 MG SL tablet Place 0.4 mg under the tongue every 5 (five) minutes as needed.     . predniSONE (DELTASONE) 20 MG tablet Take 3 tablets (60 mg total) by mouth daily. 40 tablet 0  . sertraline (ZOLOFT) 100 MG tablet Take 200 mg by mouth daily.     . tamsulosin (FLOMAX) 0.4 MG CAPS capsule Take 0.4 mg by mouth every morning.      No current facility-administered medications for this visit.     PHYSICAL EXAMINATION: ECOG PERFORMANCE STATUS: 0 - Asymptomatic  BP 101/66 (BP Location: Right Arm, Patient Position: Sitting)   Pulse 73   Temp 98.4 F (36.9 C) (Tympanic)   Resp 16   Wt 220 lb (99.8 kg)   BMI 29.03 kg/m   Filed Weights   04/18/17  0928  Weight: 220 lb (99.8 kg)    GENERAL: Well-nourished well-developed; Alert, no distress and comfortable. Accompanied by his daughter; wife.  EYES: no pallor or icterus OROPHARYNX: no thrush or ulceration; NECK: supple, no masses felt LYMPH:  no palpable lymphadenopathy in the cervical, axillary or inguinal regions LUNGS: clear to auscultation and  No wheeze or crackles HEART/CVS: regular rate & rhythm and no murmurs; No lower extremity edema ABDOMEN:abdomen soft, non-tender and normal bowel sounds Musculoskeletal:no cyanosis of digits and no clubbing  PSYCH: alert & oriented x 3 with fluent speech NEURO: no focal motor/sensory deficits SKIN:  no rashes  or significant lesions.   LABORATORY DATA:  I have reviewed the data as listed    Component Value Date/Time   NA 133 (L) 04/18/2017 0900   NA 146 (H) 05/25/2016 1514   K 3.6 04/18/2017 0900   CL 100 (L) 04/18/2017 0900   CO2 27 04/18/2017 0900   GLUCOSE 214 (H) 04/18/2017 0900   BUN 19 04/18/2017 0900   BUN 19 05/25/2016 1514   CREATININE 1.91 (H) 04/18/2017 0900   CALCIUM 8.8 (L) 04/18/2017 0900   PROT 6.6 04/18/2017 0900   ALBUMIN 3.5 04/18/2017 0900   AST 25 04/18/2017 0900   ALT 19 04/18/2017 0900   ALKPHOS 71 04/18/2017 0900   BILITOT 0.6 04/18/2017 0900   GFRNONAA 33 (L) 04/18/2017 0900   GFRAA 38 (L) 04/18/2017 0900    No results found for: SPEP, UPEP  Lab Results  Component Value Date   WBC 9.6 04/18/2017   NEUTROABS 7.8 (H) 04/18/2017   HGB 11.7 (L) 04/18/2017   HCT 33.3 (L) 04/18/2017   MCV 80.0 04/18/2017   PLT 168 04/18/2017      Chemistry      Component Value Date/Time   NA 133 (L) 04/18/2017 0900   NA 146 (H) 05/25/2016 1514   K 3.6 04/18/2017 0900   CL 100 (L) 04/18/2017 0900   CO2 27 04/18/2017 0900   BUN 19 04/18/2017 0900   BUN 19 05/25/2016 1514   CREATININE 1.91 (H) 04/18/2017 0900      Component Value Date/Time   CALCIUM 8.8 (L) 04/18/2017 0900   ALKPHOS 71 04/18/2017 0900    AST 25 04/18/2017 0900   ALT 19 04/18/2017 0900   BILITOT 0.6 04/18/2017 0900     RADIOGRAPHIC STUDIES: I have personally reviewed the radiological images as listed and agreed with the findings in the report. No results found.  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 Stage IV; currently on Opdivo s/p 14 cycles; discontinued because of cough [see discussion below].  April 16th  20th CT lung- STABLE  Bil lung nodules approximately 1-2 cm in size. [compared to feb 2017]. Patient seems to be tolerating well- except for severe cough [C discussion below]. 04/03/2017 high-resolution CT scan- shows stable lung nodules no evidence of pneumonitis or interstitial thickening to explain his recurrent cough.   #  Recommend starting Cabometyx at 60 mg/day. Discussed the potential side effects including but not limited to diarrhea elevated blood pressure hand-foot syndrome. Again understands the treatments are palliative. Patient will start today. He received his medications last week.  # Persistent cough- patient noticed slight improvement on steroids 60 mg a day; taper the steroids to 40 mg once a day; the further evaluated by M.D 3 weeks. Recommend nebulizer every 8 hours. Continue Claritin. Continue Tussionex. Continue Advair.  # hypothyroidism- subclinical  [Oct 2017-TSH 5]; MAY 2018- Normal.  # # ARF/CKD- creatinine today 1.9 baseline around 1.3. Recommend stop coxzaar; norvasc BID. New prescription given.  # Borderline elevated blood sugars- likely secondary to steroids. Monitor for now.  # follow up with X-MD in 3 weeks [ symptom management]; follow up with me in 8 weeks/labs.   No orders of the defined types were placed in this encounter.     Cammie Sickle, MD 04/18/2017 7:26 PM

## 2017-04-18 NOTE — Progress Notes (Signed)
Patient here today for follow up.   

## 2017-04-18 NOTE — Assessment & Plan Note (Addendum)
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 Stage IV; currently on Opdivo s/p 14 cycles; discontinued because of cough [see discussion below].  April 16th  20th CT lung- STABLE  Bil lung nodules approximately 1-2 cm in size. [compared to feb 2017]. Patient seems to be tolerating well- except for severe cough [C discussion below]. 04/03/2017 high-resolution CT scan- shows stable lung nodules no evidence of pneumonitis or interstitial thickening to explain his recurrent cough.   #  Recommend starting Cabometyx at 60 mg/day. Discussed the potential side effects including but not limited to diarrhea elevated blood pressure hand-foot syndrome. Again understands the treatments are palliative. Patient will start today. He received his medications last week.  # Persistent cough- patient noticed slight improvement on steroids 60 mg a day; taper the steroids to 40 mg once a day; the further evaluated by M.D 3 weeks. Recommend nebulizer every 8 hours. Continue Claritin. Continue Tussionex. Continue Advair.  # hypothyroidism- subclinical  [Oct 2017-TSH 5]; MAY 2018- Normal.  # # ARF/CKD- creatinine today 1.9 baseline around 1.3. Recommend stop coxzaar; norvasc BID. New prescription given. Recommend fluid intake.  # Borderline elevated blood sugars- likely secondary to steroids. Monitor for now.  # follow up with X-MD in 3 weeks [ symptom management]; follow up with me in 8 weeks/labs.

## 2017-04-18 NOTE — Telephone Encounter (Signed)
RN Spoke to patient in the office today.  He also officially signed his chemotherapy consent for cabozantininb.

## 2017-04-18 NOTE — Patient Instructions (Signed)
STOP COZAAR; start taking norvasc 5 mg twice a day; check Blood pressure every other day; bring the log to MD at next visit.

## 2017-04-19 DIAGNOSIS — J449 Chronic obstructive pulmonary disease, unspecified: Secondary | ICD-10-CM | POA: Diagnosis not present

## 2017-04-19 DIAGNOSIS — R05 Cough: Secondary | ICD-10-CM | POA: Diagnosis not present

## 2017-04-19 DIAGNOSIS — C642 Malignant neoplasm of left kidney, except renal pelvis: Secondary | ICD-10-CM | POA: Diagnosis not present

## 2017-04-23 ENCOUNTER — Telehealth: Payer: Self-pay | Admitting: *Deleted

## 2017-04-23 DIAGNOSIS — J449 Chronic obstructive pulmonary disease, unspecified: Secondary | ICD-10-CM

## 2017-04-23 NOTE — Telephone Encounter (Signed)
LMOM for pt to return call to let him know that he needs 2L QHS.

## 2017-04-24 NOTE — Telephone Encounter (Signed)
Pt informed and order placed.

## 2017-04-25 ENCOUNTER — Other Ambulatory Visit: Payer: PPO

## 2017-04-25 ENCOUNTER — Ambulatory Visit: Payer: PPO | Admitting: Oncology

## 2017-04-29 ENCOUNTER — Other Ambulatory Visit: Payer: Self-pay | Admitting: Internal Medicine

## 2017-04-30 NOTE — Telephone Encounter (Signed)
#   Persistent cough- patient noticed slight improvement on steroids 60 mg a day; taper the steroids to 40 mg once a day; the further evaluated by M.D 3 weeks. Recommend nebulizer every 8 hours. Continue Claritin. Continue Tussionex. Continue Advair.

## 2017-04-30 NOTE — Telephone Encounter (Signed)
I spoke with patient who states he still has his cough, but it is not as bad. I explained that we are going to reduce the does to 2 tabs a day as per Dr Agnes Lawrence note and he said [place], [place]

## 2017-04-30 NOTE — Telephone Encounter (Signed)
I have attempted twice to call patient on both home and mobile numbers and keep getting VM to see how he is doing with his cough. Per VO Dr Janese Banks, if he is improved, she wants to go ahead and reduce dose to 40 mg if not then she is considering a taper

## 2017-05-03 ENCOUNTER — Encounter: Payer: Self-pay | Admitting: Oncology

## 2017-05-03 ENCOUNTER — Inpatient Hospital Stay: Payer: PPO | Attending: Oncology

## 2017-05-03 ENCOUNTER — Other Ambulatory Visit: Payer: Self-pay

## 2017-05-03 ENCOUNTER — Inpatient Hospital Stay: Payer: PPO

## 2017-05-03 ENCOUNTER — Telehealth: Payer: Self-pay | Admitting: *Deleted

## 2017-05-03 ENCOUNTER — Inpatient Hospital Stay (HOSPITAL_BASED_OUTPATIENT_CLINIC_OR_DEPARTMENT_OTHER): Payer: PPO | Admitting: Oncology

## 2017-05-03 VITALS — BP 147/87 | HR 70 | Temp 97.3°F | Resp 20 | Wt 220.8 lb

## 2017-05-03 DIAGNOSIS — R5383 Other fatigue: Secondary | ICD-10-CM | POA: Insufficient documentation

## 2017-05-03 DIAGNOSIS — I2510 Atherosclerotic heart disease of native coronary artery without angina pectoris: Secondary | ICD-10-CM

## 2017-05-03 DIAGNOSIS — Z87891 Personal history of nicotine dependence: Secondary | ICD-10-CM

## 2017-05-03 DIAGNOSIS — I10 Essential (primary) hypertension: Secondary | ICD-10-CM

## 2017-05-03 DIAGNOSIS — D509 Iron deficiency anemia, unspecified: Secondary | ICD-10-CM

## 2017-05-03 DIAGNOSIS — R509 Fever, unspecified: Secondary | ICD-10-CM | POA: Diagnosis not present

## 2017-05-03 DIAGNOSIS — C7800 Secondary malignant neoplasm of unspecified lung: Secondary | ICD-10-CM | POA: Diagnosis not present

## 2017-05-03 DIAGNOSIS — Z85820 Personal history of malignant melanoma of skin: Secondary | ICD-10-CM

## 2017-05-03 DIAGNOSIS — Z7982 Long term (current) use of aspirin: Secondary | ICD-10-CM | POA: Insufficient documentation

## 2017-05-03 DIAGNOSIS — E871 Hypo-osmolality and hyponatremia: Secondary | ICD-10-CM | POA: Diagnosis not present

## 2017-05-03 DIAGNOSIS — Z79899 Other long term (current) drug therapy: Secondary | ICD-10-CM | POA: Diagnosis not present

## 2017-05-03 DIAGNOSIS — R3911 Hesitancy of micturition: Secondary | ICD-10-CM | POA: Diagnosis not present

## 2017-05-03 DIAGNOSIS — J449 Chronic obstructive pulmonary disease, unspecified: Secondary | ICD-10-CM | POA: Diagnosis not present

## 2017-05-03 DIAGNOSIS — Z87442 Personal history of urinary calculi: Secondary | ICD-10-CM

## 2017-05-03 DIAGNOSIS — C642 Malignant neoplasm of left kidney, except renal pelvis: Secondary | ICD-10-CM | POA: Diagnosis not present

## 2017-05-03 DIAGNOSIS — Z5189 Encounter for other specified aftercare: Secondary | ICD-10-CM

## 2017-05-03 DIAGNOSIS — R531 Weakness: Secondary | ICD-10-CM | POA: Diagnosis not present

## 2017-05-03 DIAGNOSIS — E860 Dehydration: Secondary | ICD-10-CM

## 2017-05-03 LAB — CBC WITH DIFFERENTIAL/PLATELET
BASOS PCT: 0 %
Basophils Absolute: 0 10*3/uL (ref 0–0.1)
Eosinophils Absolute: 0 10*3/uL (ref 0–0.7)
Eosinophils Relative: 0 %
HEMATOCRIT: 37.5 % — AB (ref 40.0–52.0)
HEMOGLOBIN: 12.9 g/dL — AB (ref 13.0–18.0)
LYMPHS ABS: 0.3 10*3/uL — AB (ref 1.0–3.6)
Lymphocytes Relative: 3 %
MCH: 27.6 pg (ref 26.0–34.0)
MCHC: 34.4 g/dL (ref 32.0–36.0)
MCV: 80.1 fL (ref 80.0–100.0)
Monocytes Absolute: 0.1 10*3/uL — ABNORMAL LOW (ref 0.2–1.0)
Monocytes Relative: 1 %
NEUTROS PCT: 96 %
Neutro Abs: 8.2 10*3/uL — ABNORMAL HIGH (ref 1.4–6.5)
Platelets: 171 10*3/uL (ref 150–440)
RBC: 4.69 MIL/uL (ref 4.40–5.90)
RDW: 19.8 % — ABNORMAL HIGH (ref 11.5–14.5)
WBC: 8.7 10*3/uL (ref 3.8–10.6)

## 2017-05-03 LAB — COMPREHENSIVE METABOLIC PANEL
ALBUMIN: 3.9 g/dL (ref 3.5–5.0)
ALK PHOS: 99 U/L (ref 38–126)
ALT: 53 U/L (ref 17–63)
ANION GAP: 11 (ref 5–15)
AST: 54 U/L — ABNORMAL HIGH (ref 15–41)
BILIRUBIN TOTAL: 0.5 mg/dL (ref 0.3–1.2)
BUN: 16 mg/dL (ref 6–20)
CALCIUM: 9 mg/dL (ref 8.9–10.3)
CO2: 25 mmol/L (ref 22–32)
Chloride: 92 mmol/L — ABNORMAL LOW (ref 101–111)
Creatinine, Ser: 1.43 mg/dL — ABNORMAL HIGH (ref 0.61–1.24)
GFR calc Af Amer: 54 mL/min — ABNORMAL LOW (ref >60)
GFR calc non Af Amer: 46 mL/min — ABNORMAL LOW (ref >60)
GLUCOSE: 212 mg/dL — AB (ref 65–99)
Potassium: 4 mmol/L (ref 3.5–5.1)
Sodium: 128 mmol/L — ABNORMAL LOW (ref 135–145)
TOTAL PROTEIN: 6.9 g/dL (ref 6.5–8.1)

## 2017-05-03 LAB — URINALYSIS, COMPLETE (UACMP) WITH MICROSCOPIC
Bacteria, UA: NONE SEEN
Bilirubin Urine: NEGATIVE
GLUCOSE, UA: 150 mg/dL — AB
Ketones, ur: NEGATIVE mg/dL
LEUKOCYTES UA: NEGATIVE
Nitrite: NEGATIVE
PH: 7 (ref 5.0–8.0)
Protein, ur: NEGATIVE mg/dL
Specific Gravity, Urine: 1.005 (ref 1.005–1.030)
WBC, UA: NONE SEEN WBC/hpf (ref 0–5)

## 2017-05-03 MED ORDER — SODIUM CHLORIDE 0.9 % IV SOLN
INTRAVENOUS | Status: DC
  Administered 2017-05-03: 13:00:00 via INTRAVENOUS
  Filled 2017-05-03: qty 1000

## 2017-05-03 NOTE — Addendum Note (Signed)
Addended by: Luella Cook on: 05/03/2017 02:31 PM   Modules accepted: Orders

## 2017-05-03 NOTE — Telephone Encounter (Signed)
Called to report that he is having fever 100 and chills. He dose not feel good at all, he is on Cabometyx. Please advise

## 2017-05-03 NOTE — Progress Notes (Signed)
Hematology/Oncology Consult note [facility]  [phone] [phone]  Patient Care Team: Dion Body, MD as PCP - General (Family Medicine) Jannet Mantis, MD (Dermatology) Bary Castilla, Forest Gleason, MD (General Surgery) Hollice Espy, MD as Consulting Physician (Urology)   Name of the patient: Glen Davis  [id number]  July 23, 1941   Date of visit: 05/03/17  Diagnosis- metastatic renal cell carcinoma  Chief complaint/ Reason for visit- sick visit for fatigue and chills  Heme/Onc history:  Oncology History   # MAY- June 2017- METASTATIC CLEAR CELL; LEFT RENAL CA/STAGE IV; Furhman- G-3;  [bil Pul lung nodules;incidental s/p Bx Dr.Byrnett/Dr.Oaks] s/p Cytoreductive nephrectomy; Dr.Brandon- [id number]; July 11th 2017 CT- Enlarging Lung nodules  # Aug 7th 2017- PAZOPANIB; STOP SEP 2017 [pancreatitis/poor tol]  # OCT 1 week- START NIVO q 2W x4; DEC 8th CT- "Progression"; Continue Opdivo;APRIL-MAY 2018- STABLE LUNG NODULES [stopped sec to severe cough; NO Pneumonitis]  # May 31st 2018- [place] 60mg /day  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 This morning patient states he felt weak, had chills.temp 100.1 at home. Cough has resolved. Has some hesitancy on passing urine  ECOG PS- 1 Pain scale- 0  Review of systems- Review of Systems  Constitutional: Positive for chills and malaise/fatigue. Negative for fever and weight loss.  HENT: Negative for congestion, ear discharge and nosebleeds.   Eyes: Negative for blurred vision.  Respiratory: Negative for cough, hemoptysis, sputum production, shortness of breath and wheezing.     Cardiovascular: Negative for chest pain, palpitations, orthopnea and claudication.  Gastrointestinal: Negative for abdominal pain, blood in stool, constipation, diarrhea, heartburn, melena, nausea and vomiting.  Genitourinary: Negative for dysuria, flank pain, frequency, hematuria and urgency.  Musculoskeletal: Negative for back pain, joint pain and myalgias.  Skin: Negative for rash.  Neurological: Negative for dizziness, tingling, focal weakness, seizures, weakness and headaches.  Endo/Heme/Allergies: Does not bruise/bleed easily.  Psychiatric/Behavioral: Negative for depression and suicidal ideas. The patient does not have insomnia.       Allergies  Allergen Reactions  . Lisinopril Swelling  . Other Itching    Nitroglycerin Patch.     Past Medical History:  Diagnosis Date  . CAD (coronary artery disease)   . Cancer ([place])    left arm  . COPD (chronic obstructive pulmonary disease) ([place])   . Hypertension   . Kidney stone   . Lung cancer ([place])   . Melanoma ([place]) 01/24/2016   right shoulder  . Thyroid nodule 02/02/2016   left BENIGN THYROID NODULE by FNA     Past Surgical History:  Procedure Laterality Date  . arm surgery Left    arm  . cardiac stents  2011   Angioplasty / Stenting Femoral-X2  . COLONOSCOPY  04/01/12  . CORONARY ANGIOPLASTY    . EXCISION MELANOMA WITH SENTINEL LYMPH NODE BIOPSY Right 01/24/2016   Procedure: EXCISION MELANOMA Right Shoulder;  Surgeon: Robert Bellow, MD;  Location: [facility] ORS;  Service: General;  Laterality: Right;  . LAPAROSCOPIC NEPHRECTOMY, HAND ASSISTED Left 04/24/2016   Procedure: HAND ASSISTED LAPAROSCOPIC NEPHRECTOMY;  Surgeon: Hollice Espy, MD;  Location: [facility] ORS;  Service: Urology;  Laterality: Left;  Marland Kitchen VIDEO ASSISTED  THORACOSCOPY (VATS)/THOROCOTOMY Left 03/08/2016   Procedure: VIDEO ASSISTED THORACOSCOPY (VATS) with lung biopsy - Left ;  Surgeon: Robert Bellow, MD;  Location: [facility] ORS;  Service: General;  Laterality: Left;     Social History   Social History  . Marital status: Married    Spouse name: N/A  . Number of children: N/A  . Years of education: N/A   Occupational History  . Not on file.   Social History Main Topics  . Smoking status: Former Smoker    Types: Cigarettes    Start date: 01/18/1956    Quit date: 01/17/1969  . Smokeless tobacco: Never Used  . Alcohol use No     Comment: BEER OCC  . Drug use: No  . Sexual activity: Not on file   Other Topics Concern  . Not on file   Social History Narrative  . No narrative on file    Family History  Problem Relation Age of Onset  . Hodgkin's lymphoma Daughter 5  . Kidney cancer Neg Hx   . Kidney disease Neg Hx   . Prostate cancer Neg Hx      Current Outpatient Prescriptions:  .  albuterol (PROVENTIL HFA;VENTOLIN HFA) 108 (90 Base) MCG/ACT inhaler, Inhale 2 puffs into the lungs every 6 (six) hours as needed for wheezing or shortness of breath., Disp: 1 Inhaler, Rfl: 2 .  amLODipine (NORVASC) 5 MG tablet, Take 1 tablet (5 mg total) by mouth 2 (two) times daily., Disp: 60 tablet, Rfl: 0 .  aspirin EC 81 MG tablet, Take 81 mg by mouth daily., Disp: , Rfl:  .  atorvastatin (LIPITOR) 40 MG tablet, Take 40 mg by mouth daily., Disp: , Rfl:  .  cabozantinib S-Malate 60 MG TABS, Take 60 mg by mouth daily. Take on empty stomach [1 hour before and 2 hours after a meal], Disp: 30 tablet, Rfl: 3 .  clopidogrel (PLAVIX) 75 MG tablet, Take 75 mg by mouth daily., Disp: , Rfl:  .  docusate sodium (COLACE) 100 MG capsule, TAKE 1 CAPSULE BY MOUTH TWICE A DAY, Disp: 60 capsule, Rfl: 3 .  Fluticasone-Salmeterol (ADVAIR DISKUS) 500-50 MCG/DOSE AEPB, Inhale 1 puff into the lungs 2 (two) times daily., Disp: 1 each, Rfl: 3 .  ipratropium-albuterol (DUONEB) 0.5-2.5 (3) MG/3ML SOLN, Take 3 mLs by nebulization every 4 (four) hours as needed., Disp: 360 mL, Rfl: 3 .  loratadine (CLARITIN) 10 MG tablet, Take 10 mg by mouth daily., Disp: , Rfl:  .  losartan (COZAAR)  50 MG tablet, Take 50 mg by mouth every morning. , Disp: , Rfl:  .  metoprolol succinate (TOPROL-XL) 50 MG 24 hr tablet, Take 50 mg by mouth every morning. , Disp: , Rfl:  .  predniSONE (DELTASONE) 20 MG tablet, Take 2 tablets (40 mg total) by mouth daily., Disp: 40 tablet, Rfl: 0 .  sertraline (ZOLOFT) 100 MG tablet, Take 200 mg by mouth daily. , Disp: , Rfl:  .  tamsulosin (FLOMAX) 0.4 MG CAPS capsule, Take 0.4 mg by mouth every morning. , Disp: , Rfl:  .  acetaminophen (TYLENOL) 500 MG tablet, Take 500 mg by mouth every 6 (six) hours as needed for moderate pain., Disp: , Rfl:  .  chlorpheniramine-HYDROcodone (TUSSIONEX) 10-8 MG/5ML SUER, Take 5 mLs by mouth every 12 (twelve) hours as needed for cough. (Patient not taking: Reported on 05/03/2017), Disp: 140 mL, Rfl: 0 .  magic mouthwash w/lidocaine SOLN, Take 5 mLs by mouth 4 (four) times daily. (Patient not  taking: Reported on 05/03/2017), Disp: 480 mL, Rfl: 3 .  MELATONIN PO, Take 10 mg by mouth at bedtime. , Disp: , Rfl:  .  nitroGLYCERIN (NITROSTAT) 0.4 MG SL tablet, Place 0.4 mg under the tongue every 5 (five) minutes as needed. , Disp: , Rfl:   Current Facility-Administered Medications:  .  0.9 %  sodium chloride infusion, , Intravenous, Continuous, Sindy Guadeloupe, MD  Physical exam:  Vitals:   05/03/17 1156  BP: (!) 147/87  Pulse: 70  Resp: 20  Temp: 97.3 F (36.3 C)  TempSrc: Tympanic  Weight: 220 lb 12.8 oz (100.2 kg)   Physical Exam  Constitutional: He is oriented to person, place, and time and well-developed, well-nourished, and in no distress.  HENT:  Head: Normocephalic and atraumatic.  Eyes: EOM are normal. Pupils are equal, round, and reactive to light.  Neck: Normal range of motion.  Cardiovascular: Normal rate, regular rhythm and normal heart sounds.   Pulmonary/Chest: Effort normal and breath sounds normal.  Abdominal: Soft. Bowel sounds are normal.  Neurological: He is alert and oriented to person, place, and  time.  Skin: Skin is warm and dry.     CMP Latest Ref Rng & Units 05/03/2017  Glucose 65 - 99 mg/dL 212(H)  BUN 6 - 20 mg/dL 16  Creatinine 0.61 - 1.24 mg/dL 1.43(H)  Sodium 135 - 145 mmol/L 128(L)  Potassium 3.5 - 5.1 mmol/L 4.0  Chloride 101 - 111 mmol/L 92(L)  CO2 22 - 32 mmol/L 25  Calcium 8.9 - 10.3 mg/dL 9.0  Total Protein 6.5 - 8.1 g/dL 6.9  Total Bilirubin 0.3 - 1.2 mg/dL 0.5  Alkaline Phos 38 - 126 U/L 99  AST 15 - 41 U/L 54(H)  ALT 17 - 63 U/L 53   CBC Latest Ref Rng & Units 05/03/2017  WBC 3.8 - 10.6 K/uL 8.7  Hemoglobin 13.0 - 18.0 g/dL 12.9(L)  Hematocrit 40.0 - 52.0 % 37.5(L)  Platelets 150 - 440 K/uL 171      Assessment and plan- Patient is a 76 y.o. male with stage IV RCC currently on cabometyx. Here for sick visit for fatigue and chills  1. He is normotensive and hr normal. Does not appear significantly dehydrated. He does have hyponatremia and low chloride. Will give 1L NS today. Also check blood cultures, UA. Hold off on CXR given no symptoms and normal exam. If UA positive, will give him empiric antibiotics.  2. RCC- continue cabozantinib. Will recheck cmp in 2 weeks. AST mildly elevated. Encouraged patient to be more active and do some exercises to improve stamina and counter fatigue.  He will see Dr. Rogue Bussing in 3-4 weeks with labs   Visit Diagnosis 1. Dehydration   2. Cancer of left kidney excluding renal pelvis [facility])      Dr. Randa Evens, MD, MPH [facility] Pager- [phone] 05/03/2017 12:54 PM

## 2017-05-03 NOTE — Progress Notes (Signed)
Pt of Dr Yevette Edwards here today as add on- per pt this am pt stated he had a temperature of 100 ( 7 am) per pt over 3 days feeler weaker  Temp today tympanic-97.3, and orally 98 degrees   .Came today to be s/b Dr Janese Banks.

## 2017-05-03 NOTE — Telephone Encounter (Signed)
We can see him today and get cbc, cmp, UA and blood cultures and give him some fluids if he is willing.

## 2017-05-03 NOTE — Telephone Encounter (Signed)
Patient wife agreeable to appt at 130 for labs, followed by md and IVF

## 2017-05-05 LAB — URINE CULTURE: Culture: <10000 — AB

## 2017-05-08 ENCOUNTER — Other Ambulatory Visit: Payer: Self-pay | Admitting: *Deleted

## 2017-05-08 LAB — CULTURE, BLOOD (ROUTINE X 2)
Culture: NO GROWTH
Culture: NO GROWTH

## 2017-05-08 NOTE — Telephone Encounter (Signed)
Patient is needing refill of his Prednisone, does it need to be tapered again? Or refill at 40 mg daily? Please advise

## 2017-05-09 ENCOUNTER — Other Ambulatory Visit: Payer: PPO

## 2017-05-09 ENCOUNTER — Ambulatory Visit: Payer: PPO | Admitting: Oncology

## 2017-05-09 MED ORDER — PREDNISONE 20 MG PO TABS: 40.0000 mg | ORAL_TABLET | Freq: Every day | ORAL | 0 refills | Status: DC

## 2017-05-09 NOTE — Telephone Encounter (Signed)
Keep it at 40 mg until he sees a physician

## 2017-05-09 NOTE — Addendum Note (Signed)
Addended by: Betti Cruz on: 05/09/2017 08:46 AM   Modules accepted: Orders

## 2017-05-13 DIAGNOSIS — C44622 Squamous cell carcinoma of skin of right upper limb, including shoulder: Secondary | ICD-10-CM | POA: Diagnosis not present

## 2017-05-16 DIAGNOSIS — R05 Cough: Secondary | ICD-10-CM | POA: Diagnosis not present

## 2017-05-16 DIAGNOSIS — C642 Malignant neoplasm of left kidney, except renal pelvis: Secondary | ICD-10-CM | POA: Diagnosis not present

## 2017-05-16 DIAGNOSIS — J449 Chronic obstructive pulmonary disease, unspecified: Secondary | ICD-10-CM | POA: Diagnosis not present

## 2017-05-16 DIAGNOSIS — R0602 Shortness of breath: Secondary | ICD-10-CM | POA: Diagnosis not present

## 2017-05-17 ENCOUNTER — Inpatient Hospital Stay: Payer: PPO

## 2017-05-17 DIAGNOSIS — C642 Malignant neoplasm of left kidney, except renal pelvis: Secondary | ICD-10-CM | POA: Diagnosis not present

## 2017-05-17 LAB — CBC WITH DIFFERENTIAL/PLATELET
Basophils Absolute: 0 10*3/uL (ref 0–0.1)
Basophils Relative: 0 %
Eosinophils Absolute: 0.1 10*3/uL (ref 0–0.7)
Eosinophils Relative: 1 %
HEMATOCRIT: 37.1 % — AB (ref 40.0–52.0)
Hemoglobin: 13.2 g/dL (ref 13.0–18.0)
LYMPHS PCT: 15 %
Lymphs Abs: 1 10*3/uL (ref 1.0–3.6)
MCH: 28.9 pg (ref 26.0–34.0)
MCHC: 35.7 g/dL (ref 32.0–36.0)
MCV: 81.1 fL (ref 80.0–100.0)
MONO ABS: 0.3 10*3/uL (ref 0.2–1.0)
MONOS PCT: 4 %
NEUTROS ABS: 5.6 10*3/uL (ref 1.4–6.5)
Neutrophils Relative %: 80 %
Platelets: 154 10*3/uL (ref 150–440)
RBC: 4.58 MIL/uL (ref 4.40–5.90)
RDW: 24 % — AB (ref 11.5–14.5)
WBC: 7 10*3/uL (ref 3.8–10.6)

## 2017-05-17 LAB — COMPREHENSIVE METABOLIC PANEL
ALT: 45 U/L (ref 17–63)
ANION GAP: 6 (ref 5–15)
AST: 32 U/L (ref 15–41)
Albumin: 3.7 g/dL (ref 3.5–5.0)
Alkaline Phosphatase: 87 U/L (ref 38–126)
BUN: 21 mg/dL — ABNORMAL HIGH (ref 6–20)
CHLORIDE: 96 mmol/L — AB (ref 101–111)
CO2: 31 mmol/L (ref 22–32)
Calcium: 9.2 mg/dL (ref 8.9–10.3)
Creatinine, Ser: 1.37 mg/dL — ABNORMAL HIGH (ref 0.61–1.24)
GFR calc Af Amer: 57 mL/min — ABNORMAL LOW (ref >60)
GFR calc non Af Amer: 49 mL/min — ABNORMAL LOW (ref >60)
GLUCOSE: 112 mg/dL — AB (ref 65–99)
POTASSIUM: 3.7 mmol/L (ref 3.5–5.1)
Sodium: 133 mmol/L — ABNORMAL LOW (ref 135–145)
Total Bilirubin: 0.7 mg/dL (ref 0.3–1.2)
Total Protein: 6.7 g/dL (ref 6.5–8.1)

## 2017-05-20 ENCOUNTER — Telehealth: Payer: Self-pay | Admitting: Internal Medicine

## 2017-05-20 ENCOUNTER — Telehealth: Payer: Self-pay | Admitting: *Deleted

## 2017-05-20 DIAGNOSIS — J449 Chronic obstructive pulmonary disease, unspecified: Secondary | ICD-10-CM

## 2017-05-20 NOTE — Telephone Encounter (Signed)
Patient having increasing sob when going out as it has been very hot wife calling to get a portable tank he can carry when going out

## 2017-05-20 NOTE — Telephone Encounter (Signed)
Order placed for POC

## 2017-05-20 NOTE — Telephone Encounter (Signed)
Spoke with patient and patient's wife. Dr. Mortimer Fries order the oxygen with [facility]. Wife gave verbal understanding to contact pulmonary for request for different oxygen tank.

## 2017-05-20 NOTE — Telephone Encounter (Signed)
Left message on patient cell that order has been placed for POC. He should be contacted by [facility]. Nothing further needed at this time.

## 2017-05-20 NOTE — Telephone Encounter (Signed)
Asking to have an order for small tanks of oxygen so that he can use them when he goes out of the house. Please send order

## 2017-05-21 ENCOUNTER — Other Ambulatory Visit: Payer: Self-pay | Admitting: Internal Medicine

## 2017-05-21 DIAGNOSIS — J449 Chronic obstructive pulmonary disease, unspecified: Secondary | ICD-10-CM

## 2017-05-27 ENCOUNTER — Telehealth: Payer: Self-pay | Admitting: Internal Medicine

## 2017-05-27 DIAGNOSIS — C44622 Squamous cell carcinoma of skin of right upper limb, including shoulder: Secondary | ICD-10-CM | POA: Diagnosis not present

## 2017-05-27 NOTE — Telephone Encounter (Signed)
Pt and Corene Cornea [facility]) notified that during SMW in May patient did not qualify for POC. Pt advised that we can re-evaluate in a couple of months.  Nothing further needed.

## 2017-05-27 NOTE — Telephone Encounter (Signed)
Glen Davis with [facility] calling wanting to leave a message Pt is only Nocturnal Oxygen per Dr Mortimer Fries Pt will need walk test for new portable oxygen   Please advise.

## 2017-05-28 ENCOUNTER — Telehealth: Payer: Self-pay | Admitting: Internal Medicine

## 2017-05-28 NOTE — Telephone Encounter (Signed)
Pt wife calling stating they are planning on going out of town and would like [place] to send in orders for them to get a portable oxygen tank to be with them  Please advise.

## 2017-05-28 NOTE — Telephone Encounter (Signed)
Explained to patient's wife that he did not qualify for portable 02. He did not drop during SMW and that is how portable 02 is qualified. We may be able to retest in a couple of months if they feel necessary.

## 2017-05-29 DIAGNOSIS — L570 Actinic keratosis: Secondary | ICD-10-CM | POA: Diagnosis not present

## 2017-05-29 DIAGNOSIS — Z85828 Personal history of other malignant neoplasm of skin: Secondary | ICD-10-CM | POA: Diagnosis not present

## 2017-05-29 DIAGNOSIS — Z86018 Personal history of other benign neoplasm: Secondary | ICD-10-CM | POA: Diagnosis not present

## 2017-05-30 ENCOUNTER — Inpatient Hospital Stay (HOSPITAL_BASED_OUTPATIENT_CLINIC_OR_DEPARTMENT_OTHER): Payer: PPO | Admitting: Internal Medicine

## 2017-05-30 ENCOUNTER — Inpatient Hospital Stay: Payer: PPO | Attending: Internal Medicine

## 2017-05-30 VITALS — BP 151/87 | HR 63 | Temp 97.3°F | Resp 20 | Ht 73.0 in | Wt 214.6 lb

## 2017-05-30 DIAGNOSIS — C7801 Secondary malignant neoplasm of right lung: Secondary | ICD-10-CM | POA: Diagnosis not present

## 2017-05-30 DIAGNOSIS — C642 Malignant neoplasm of left kidney, except renal pelvis: Secondary | ICD-10-CM

## 2017-05-30 DIAGNOSIS — Z79899 Other long term (current) drug therapy: Secondary | ICD-10-CM | POA: Diagnosis not present

## 2017-05-30 DIAGNOSIS — Z87442 Personal history of urinary calculi: Secondary | ICD-10-CM | POA: Insufficient documentation

## 2017-05-30 DIAGNOSIS — E041 Nontoxic single thyroid nodule: Secondary | ICD-10-CM | POA: Diagnosis not present

## 2017-05-30 DIAGNOSIS — Z807 Family history of other malignant neoplasms of lymphoid, hematopoietic and related tissues: Secondary | ICD-10-CM | POA: Insufficient documentation

## 2017-05-30 DIAGNOSIS — G629 Polyneuropathy, unspecified: Secondary | ICD-10-CM

## 2017-05-30 DIAGNOSIS — J449 Chronic obstructive pulmonary disease, unspecified: Secondary | ICD-10-CM | POA: Insufficient documentation

## 2017-05-30 DIAGNOSIS — Z7982 Long term (current) use of aspirin: Secondary | ICD-10-CM

## 2017-05-30 DIAGNOSIS — I10 Essential (primary) hypertension: Secondary | ICD-10-CM | POA: Insufficient documentation

## 2017-05-30 DIAGNOSIS — E039 Hypothyroidism, unspecified: Secondary | ICD-10-CM

## 2017-05-30 DIAGNOSIS — Z85820 Personal history of malignant melanoma of skin: Secondary | ICD-10-CM | POA: Insufficient documentation

## 2017-05-30 DIAGNOSIS — Z905 Acquired absence of kidney: Secondary | ICD-10-CM

## 2017-05-30 DIAGNOSIS — K1230 Oral mucositis (ulcerative), unspecified: Secondary | ICD-10-CM

## 2017-05-30 DIAGNOSIS — I2510 Atherosclerotic heart disease of native coronary artery without angina pectoris: Secondary | ICD-10-CM

## 2017-05-30 DIAGNOSIS — Z7952 Long term (current) use of systemic steroids: Secondary | ICD-10-CM | POA: Diagnosis not present

## 2017-05-30 DIAGNOSIS — C7802 Secondary malignant neoplasm of left lung: Secondary | ICD-10-CM

## 2017-05-30 DIAGNOSIS — Z87891 Personal history of nicotine dependence: Secondary | ICD-10-CM | POA: Diagnosis not present

## 2017-05-30 LAB — CBC WITH DIFFERENTIAL/PLATELET
BASOS ABS: 0 10*3/uL (ref 0–0.1)
BASOS PCT: 0 %
Eosinophils Absolute: 0 10*3/uL (ref 0–0.7)
Eosinophils Relative: 1 %
HEMATOCRIT: 36.9 % — AB (ref 40.0–52.0)
HEMOGLOBIN: 13 g/dL (ref 13.0–18.0)
Lymphocytes Relative: 16 %
Lymphs Abs: 1 10*3/uL (ref 1.0–3.6)
MCH: 29.6 pg (ref 26.0–34.0)
MCHC: 35.3 g/dL (ref 32.0–36.0)
MCV: 84 fL (ref 80.0–100.0)
MONO ABS: 0.2 10*3/uL (ref 0.2–1.0)
Monocytes Relative: 4 %
NEUTROS ABS: 5.1 10*3/uL (ref 1.4–6.5)
NEUTROS PCT: 79 %
Platelets: 148 10*3/uL — ABNORMAL LOW (ref 150–440)
RBC: 4.39 MIL/uL — ABNORMAL LOW (ref 4.40–5.90)
RDW: 25.4 % — ABNORMAL HIGH (ref 11.5–14.5)
WBC: 6.5 10*3/uL (ref 3.8–10.6)

## 2017-05-30 LAB — COMPREHENSIVE METABOLIC PANEL
ALBUMIN: 3.6 g/dL (ref 3.5–5.0)
ALK PHOS: 79 U/L (ref 38–126)
ALT: 50 U/L (ref 17–63)
AST: 42 U/L — AB (ref 15–41)
Anion gap: 9 (ref 5–15)
BILIRUBIN TOTAL: 0.7 mg/dL (ref 0.3–1.2)
BUN: 16 mg/dL (ref 6–20)
CALCIUM: 9.2 mg/dL (ref 8.9–10.3)
CO2: 29 mmol/L (ref 22–32)
Chloride: 101 mmol/L (ref 101–111)
Creatinine, Ser: 1.3 mg/dL — ABNORMAL HIGH (ref 0.61–1.24)
GFR calc Af Amer: 60 mL/min — ABNORMAL LOW (ref >60)
GFR calc non Af Amer: 52 mL/min — ABNORMAL LOW (ref >60)
GLUCOSE: 134 mg/dL — AB (ref 65–99)
Potassium: 4 mmol/L (ref 3.5–5.1)
Sodium: 139 mmol/L (ref 135–145)
TOTAL PROTEIN: 6.4 g/dL — AB (ref 6.5–8.1)

## 2017-05-30 NOTE — Progress Notes (Signed)
[place] OFFICE PROGRESS NOTE  Patient Care Team: Dion Body, MD as PCP - General (Family Medicine) Jannet Mantis, MD (Dermatology) Bary Castilla Forest Gleason, MD (General Surgery) Hollice Espy, MD as Consulting Physician (Urology)  No matching staging information was found for the patient.   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  male pleasant patient above history of  renal cell cancer status post left nephrectomy- With metastases to the lung Currently on Cabo 60 mg a day is here for follow-up he has been on for the last 6 weeks.  Patient complains of mild pain in his feet; with mild erythema. Denies any pain in his hands. No desquamation. His cough is improved. He is not on steroids. Denies any headaches. Denies any chest pain or shortness of breath or cough. Intermittent fatigue.  REVIEW OF SYSTEMS:  A complete 10 point review of system is done which is negative except mentioned above/history of present illness.   PAST MEDICAL HISTORY :  Past Medical History:  Diagnosis Date  . CAD (coronary artery disease)   . Cancer ([place])    left arm  . COPD (chronic obstructive pulmonary disease) ([place])   . Hypertension   . Kidney stone   . Lung cancer  ([place])   . Melanoma ([place]) 01/24/2016   right shoulder  . Thyroid nodule 02/02/2016   left BENIGN THYROID NODULE by FNA    PAST SURGICAL HISTORY :   Past Surgical History:  Procedure Laterality Date  . arm surgery Left    arm  . cardiac stents  2011   Angioplasty / Stenting Femoral-X2  . COLONOSCOPY  04/01/12  . CORONARY ANGIOPLASTY    . EXCISION MELANOMA WITH SENTINEL LYMPH NODE BIOPSY Right 01/24/2016   Procedure: EXCISION MELANOMA Right Shoulder;  Surgeon: Robert Bellow, MD;  Location: [facility] ORS;  Service: General;  Laterality: Right;  . LAPAROSCOPIC NEPHRECTOMY, HAND ASSISTED Left 04/24/2016   Procedure: HAND ASSISTED LAPAROSCOPIC NEPHRECTOMY;  Surgeon: Hollice Espy, MD;  Location: [facility] ORS;  Service: Urology;  Laterality: Left;  Marland Kitchen VIDEO ASSISTED THORACOSCOPY (VATS)/THOROCOTOMY Left 03/08/2016   Procedure: VIDEO ASSISTED THORACOSCOPY (VATS) with lung biopsy - Left ;  Surgeon: Robert Bellow, MD;  Location: [facility] ORS;  Service: General;  Laterality: Left;    FAMILY HISTORY :   Family History  Problem Relation Age of Onset  . Hodgkin's lymphoma Daughter 5  . Kidney cancer Neg Hx   . Kidney disease Neg Hx   . Prostate cancer Neg Hx     SOCIAL HISTORY:   Social History  Substance Use Topics  . Smoking status: Former Smoker    Types: Cigarettes    Start date: 01/18/1956    Quit date: 01/17/1969  . Smokeless tobacco: Never Used  . Alcohol use  No     Comment: BEER OCC    ALLERGIES:  is allergic to lisinopril and other.  MEDICATIONS:  Current Outpatient Prescriptions  Medication Sig Dispense Refill  . acetaminophen (TYLENOL) 500 MG tablet Take 500 mg by mouth every 6 (six) hours as needed for moderate pain.    Marland Kitchen albuterol (PROVENTIL HFA;VENTOLIN HFA) 108 (90 Base) MCG/ACT inhaler Inhale 2 puffs into the lungs every 6 (six) hours as needed for wheezing or shortness of breath. 1 Inhaler 2  . amLODipine (NORVASC) 5 MG tablet Take 1 tablet (5 mg total) by mouth 2 (two) times  daily. 60 tablet 0  . aspirin EC 81 MG tablet Take 81 mg by mouth daily.    Marland Kitchen atorvastatin (LIPITOR) 40 MG tablet Take 40 mg by mouth daily.    . cabozantinib S-Malate 60 MG TABS Take 60 mg by mouth daily. Take on empty stomach [1 hour before and 2 hours after a meal] 30 tablet 3  . chlorpheniramine-HYDROcodone (TUSSIONEX) 10-8 MG/5ML SUER Take 5 mLs by mouth every 12 (twelve) hours as needed for cough. 140 mL 0  . clopidogrel (PLAVIX) 75 MG tablet Take 75 mg by mouth daily.    Marland Kitchen docusate sodium (COLACE) 100 MG capsule TAKE 1 CAPSULE BY MOUTH TWICE A DAY 60 capsule 3  . Fluticasone-Salmeterol (ADVAIR DISKUS) 500-50 MCG/DOSE AEPB Inhale 1 puff into the lungs 2 (two) times daily. 1 each 3  . ipratropium-albuterol (DUONEB) 0.5-2.5 (3) MG/3ML SOLN Take 3 mLs by nebulization every 4 (four) hours as needed. 360 mL 3  . loratadine (CLARITIN) 10 MG tablet Take 10 mg by mouth daily.    Marland Kitchen losartan (COZAAR) 50 MG tablet Take 50 mg by mouth every morning.     . magic mouthwash w/lidocaine SOLN Take 5 mLs by mouth 4 (four) times daily. 480 mL 3  . MELATONIN PO Take 10 mg by mouth at bedtime.     . metoprolol succinate (TOPROL-XL) 50 MG 24 hr tablet Take 50 mg by mouth every morning.     . nitroGLYCERIN (NITROSTAT) 0.4 MG SL tablet Place 0.4 mg under the tongue every 5 (five) minutes as needed.     . predniSONE (DELTASONE) 20 MG tablet Take 2 tablets (40 mg total) by mouth daily. 40 tablet 0  . sertraline (ZOLOFT) 100 MG tablet Take 200 mg by mouth daily.     . tamsulosin (FLOMAX) 0.4 MG CAPS capsule Take 0.4 mg by mouth every morning.      No current facility-administered medications for this visit.     PHYSICAL EXAMINATION: ECOG PERFORMANCE STATUS: 0 - Asymptomatic  BP (!) 151/87 (BP Location: Left Arm, Patient Position: Sitting)   Pulse 63   Temp (!) 97.3 F (36.3 C) (Tympanic)   Resp 20   Ht 6\' 1"  (1.854 m)   Wt 214 lb 9.6 oz (97.3 kg)   BMI 28.31 kg/m   Filed Weights   05/30/17 1017   Weight: 214 lb 9.6 oz (97.3 kg)    GENERAL: Well-nourished well-developed; Alert, no distress and comfortable. Accompanied by his daughter; wife.  EYES: no pallor or icterus OROPHARYNX: no thrush or ulceration; NECK: supple, no masses felt LYMPH:  no palpable lymphadenopathy in the cervical, axillary or inguinal regions LUNGS: clear to auscultation and  No wheeze or crackles HEART/CVS: regular rate & rhythm and no murmurs; No lower extremity edema ABDOMEN:abdomen soft, non-tender and normal bowel sounds Musculoskeletal:no cyanosis of digits and no clubbing  PSYCH: alert & oriented x  3 with fluent speech NEURO: no focal motor/sensory deficits SKIN:  Mild erythema of his bilateral lower extremities. No desquamation.    LABORATORY DATA:  I have reviewed the data as listed    Component Value Date/Time   NA 139 05/30/2017 0950   NA 146 (H) 05/25/2016 1514   K 4.0 05/30/2017 0950   CL 101 05/30/2017 0950   CO2 29 05/30/2017 0950   GLUCOSE 134 (H) 05/30/2017 0950   BUN 16 05/30/2017 0950   BUN 19 05/25/2016 1514   CREATININE 1.30 (H) 05/30/2017 0950   CALCIUM 9.2 05/30/2017 0950   PROT 6.4 (L) 05/30/2017 0950   ALBUMIN 3.6 05/30/2017 0950   AST 42 (H) 05/30/2017 0950   ALT 50 05/30/2017 0950   ALKPHOS 79 05/30/2017 0950   BILITOT 0.7 05/30/2017 0950   GFRNONAA 52 (L) 05/30/2017 0950   GFRAA 60 (L) 05/30/2017 0950    No results found for: SPEP, UPEP  Lab Results  Component Value Date   WBC 6.5 05/30/2017   NEUTROABS 5.1 05/30/2017   HGB 13.0 05/30/2017   HCT 36.9 (L) 05/30/2017   MCV 84.0 05/30/2017   PLT 148 (L) 05/30/2017      Chemistry      Component Value Date/Time   NA 139 05/30/2017 0950   NA 146 (H) 05/25/2016 1514   K 4.0 05/30/2017 0950   CL 101 05/30/2017 0950   CO2 29 05/30/2017 0950   BUN 16 05/30/2017 0950   BUN 19 05/25/2016 1514   CREATININE 1.30 (H) 05/30/2017 0950      Component Value Date/Time   CALCIUM 9.2 05/30/2017 0950   ALKPHOS 79  05/30/2017 0950   AST 42 (H) 05/30/2017 0950   ALT 50 05/30/2017 0950   BILITOT 0.7 05/30/2017 0950     RADIOGRAPHIC STUDIES: I have personally reviewed the radiological images as listed and agreed with the findings in the report. No results found.  I ASSESSMENT & PLAN:  Cancer of left kidney excluding renal pelvis ([place]) # Left kidney cancer metastatic to the lung- currently on [place] 60 mg a day [since May 30th]. No clinical evidence of progression. Patient tolerating fairly well except [hand-foot; elevated blood pressure-C discussion below].  # Continue current therapy. We'll plan to get a CT scan in approximately 3 months since the start of the treatment/N of August/early September.  # Hand-foot syndrome grade 1- recommend Vaseline/udder cream.   # hypothyroidism- subclinical  [Oct 2017-TSH 5]; MAY 2018- Normal.  # elevated BP- keep a log of the blood pressures at home; call us if the systolic greater than 025/ENIDPOEUM greater than 35T.  # mucistis- recommend baking soda and salt water rinses; continue Magic mouthwash.   # follow up with -MD in 3 weeks [ symptom management].   No orders of the defined types were placed in this encounter.     Cammie Sickle, MD 05/30/2017 5:30 PM

## 2017-05-30 NOTE — Assessment & Plan Note (Addendum)
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 No clinical evidence of progression. Patient tolerating fairly well except [hand-foot; elevated blood pressure-C discussion below].  # Continue current therapy. We'll plan to get a CT scan in approximately 3 months since the start of the treatment/N of August/early September.  # Hand-foot syndrome grade 1- recommend Vaseline/udder cream.   # hypothyroidism- subclinical  [Oct 2017-TSH 5]; MAY 2018- Normal.  # elevated BP- keep a log of the blood pressures at home; call us if the systolic greater than 096/GEZMOQHUT greater than 65Y.  # mucistis- recommend baking soda and salt water rinses; continue Magic mouthwash.   # follow up with -MD in 3 weeks [ symptom management].

## 2017-05-30 NOTE — Progress Notes (Signed)
Patient here for follow up. He had surgery on his right arm for melanoma

## 2017-06-05 ENCOUNTER — Other Ambulatory Visit: Payer: Self-pay | Admitting: Family Medicine

## 2017-06-05 DIAGNOSIS — K5900 Constipation, unspecified: Secondary | ICD-10-CM

## 2017-06-05 MED ORDER — DOCUSATE SODIUM 100 MG PO CAPS: 100.0000 mg | ORAL_CAPSULE | Freq: Two times a day (BID) | ORAL | 3 refills | Status: DC

## 2017-06-11 ENCOUNTER — Ambulatory Visit: Payer: PPO | Admitting: Internal Medicine

## 2017-06-11 ENCOUNTER — Emergency Department: Payer: PPO

## 2017-06-11 ENCOUNTER — Other Ambulatory Visit: Payer: Self-pay

## 2017-06-11 ENCOUNTER — Emergency Department
Admission: EM | Admit: 2017-06-11 | Discharge: 2017-06-11 | Disposition: A | Discharge status: Home / Self Care | Payer: PPO | Attending: Emergency Medicine | Admitting: Emergency Medicine

## 2017-06-11 ENCOUNTER — Telehealth: Payer: Self-pay | Admitting: *Deleted

## 2017-06-11 ENCOUNTER — Encounter: Payer: Self-pay | Admitting: Emergency Medicine

## 2017-06-11 ENCOUNTER — Telehealth: Payer: Self-pay | Admitting: Internal Medicine

## 2017-06-11 DIAGNOSIS — I10 Essential (primary) hypertension: Secondary | ICD-10-CM | POA: Diagnosis not present

## 2017-06-11 DIAGNOSIS — Z7982 Long term (current) use of aspirin: Secondary | ICD-10-CM | POA: Diagnosis not present

## 2017-06-11 DIAGNOSIS — Z79899 Other long term (current) drug therapy: Secondary | ICD-10-CM | POA: Diagnosis not present

## 2017-06-11 DIAGNOSIS — J9811 Atelectasis: Secondary | ICD-10-CM | POA: Diagnosis not present

## 2017-06-11 DIAGNOSIS — Z7902 Long term (current) use of antithrombotics/antiplatelets: Secondary | ICD-10-CM | POA: Insufficient documentation

## 2017-06-11 DIAGNOSIS — J449 Chronic obstructive pulmonary disease, unspecified: Secondary | ICD-10-CM | POA: Insufficient documentation

## 2017-06-11 DIAGNOSIS — Z87891 Personal history of nicotine dependence: Secondary | ICD-10-CM | POA: Insufficient documentation

## 2017-06-11 DIAGNOSIS — R410 Disorientation, unspecified: Secondary | ICD-10-CM | POA: Insufficient documentation

## 2017-06-11 DIAGNOSIS — R4182 Altered mental status, unspecified: Secondary | ICD-10-CM | POA: Diagnosis present

## 2017-06-11 DIAGNOSIS — C3490 Malignant neoplasm of unspecified part of unspecified bronchus or lung: Secondary | ICD-10-CM | POA: Diagnosis not present

## 2017-06-11 LAB — COMPREHENSIVE METABOLIC PANEL
ALBUMIN: 3.6 g/dL (ref 3.5–5.0)
ALK PHOS: 74 U/L (ref 38–126)
ALT: 46 U/L (ref 17–63)
AST: 39 U/L (ref 15–41)
Anion gap: 9 (ref 5–15)
BILIRUBIN TOTAL: 0.9 mg/dL (ref 0.3–1.2)
BUN: 16 mg/dL (ref 6–20)
CALCIUM: 9.1 mg/dL (ref 8.9–10.3)
CO2: 29 mmol/L (ref 22–32)
Chloride: 100 mmol/L — ABNORMAL LOW (ref 101–111)
Creatinine, Ser: 1.16 mg/dL (ref 0.61–1.24)
GFR calc Af Amer: >60 mL/min (ref >60)
GFR calc non Af Amer: 59 mL/min — ABNORMAL LOW (ref >60)
GLUCOSE: 135 mg/dL — AB (ref 65–99)
Potassium: 3.6 mmol/L (ref 3.5–5.1)
Sodium: 138 mmol/L (ref 135–145)
TOTAL PROTEIN: 6.5 g/dL (ref 6.5–8.1)

## 2017-06-11 LAB — CBC WITH DIFFERENTIAL/PLATELET
BASOS ABS: 0 10*3/uL (ref 0–0.1)
BASOS PCT: 0 %
Eosinophils Absolute: 0 10*3/uL (ref 0–0.7)
Eosinophils Relative: 1 %
HEMATOCRIT: 39.6 % — AB (ref 40.0–52.0)
HEMOGLOBIN: 13.9 g/dL (ref 13.0–18.0)
Lymphocytes Relative: 18 %
Lymphs Abs: 1.3 10*3/uL (ref 1.0–3.6)
MCH: 30.4 pg (ref 26.0–34.0)
MCHC: 35.1 g/dL (ref 32.0–36.0)
MCV: 86.8 fL (ref 80.0–100.0)
MONOS PCT: 3 %
Monocytes Absolute: 0.2 10*3/uL (ref 0.2–1.0)
NEUTROS ABS: 5.6 10*3/uL (ref 1.4–6.5)
NEUTROS PCT: 78 %
Platelets: 140 10*3/uL — ABNORMAL LOW (ref 150–440)
RBC: 4.56 MIL/uL (ref 4.40–5.90)
RDW: 26.2 % — ABNORMAL HIGH (ref 11.5–14.5)
WBC: 7.1 10*3/uL (ref 3.8–10.6)

## 2017-06-11 LAB — URINALYSIS, COMPLETE (UACMP) WITH MICROSCOPIC
BILIRUBIN URINE: NEGATIVE
Glucose, UA: NEGATIVE mg/dL
Hgb urine dipstick: NEGATIVE
Ketones, ur: NEGATIVE mg/dL
Leukocytes, UA: NEGATIVE
Nitrite: NEGATIVE
Protein, ur: NEGATIVE mg/dL
SPECIFIC GRAVITY, URINE: 1.018 (ref 1.005–1.030)
pH: 5 (ref 5.0–8.0)

## 2017-06-11 MED ORDER — SODIUM CHLORIDE 0.9 % IV SOLN
1000.0000 mL | Freq: Once | INTRAVENOUS | Status: AC
  Administered 2017-06-11: 1000 mL via INTRAVENOUS

## 2017-06-11 NOTE — Telephone Encounter (Signed)
Pt wife states pt needs oxygen during the day, he currently has it just at night.

## 2017-06-11 NOTE — Telephone Encounter (Signed)
appt scheduled to come in and re-eval 02 needs 7/31.

## 2017-06-11 NOTE — Telephone Encounter (Signed)
Routed to pulmonary

## 2017-06-11 NOTE — ED Provider Notes (Signed)
[facility] Emergency Department Provider Note   ____________________________________________    I have reviewed the triage vital signs and the nursing notes.   HISTORY  Chief Complaint Altered Mental Status     HPI Glen Davis is a 76 y.o. male who presents with increased confusion. 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 However over the last several days he has been more confused than usual. Patient reports he feels fine. No neuro deficits reported. No headache. He did fall 2 days ago because he got dizzy and struck the left forehead. He denies nausea or vomiting. He follows with [facility]. Wife reports he has been eating oxygen throughout the day typically he only uses it at night.   Past Medical History:  Diagnosis Date  . CAD (coronary artery disease)   . Cancer ([place])    left arm  . COPD (chronic obstructive pulmonary disease) ([place])   . Hypertension   . Kidney stone   . Lung cancer ([place])   . Melanoma ([place]) 01/24/2016   right shoulder  . Thyroid nodule 02/02/2016   left BENIGN THYROID NODULE by FNA    Patient Active Problem List   Diagnosis Date Noted  . Acute sinusitis 02/06/2017  . Cough 10/09/2016  . SOB (shortness of breath) 09/13/2016  . Syncope 08/17/2016  . Situational anxiety 06/05/2016  . 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 Coronary artery disease involving native coronary artery of native heart without angina pectoris 03/16/2016  . Essential (primary) hypertension 03/16/2016  . Hypertriglyceridemia 03/16/2016  . Left thyroid nodule 02/03/2016  . Pulmonary nodules/lesions, multiple 02/03/2016  . Counseling regarding goals of care 01/30/2016  . Vaccine counseling 01/30/2016  . 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 Melanoma of skin ([place])  01/12/2016  . Breathlessness on exertion 04/25/2015    Past Surgical History:  Procedure Laterality Date  . arm surgery Left    arm  . cardiac stents  2011   Angioplasty / Stenting Femoral-X2  . COLONOSCOPY  04/01/12  . CORONARY ANGIOPLASTY    . EXCISION MELANOMA WITH SENTINEL LYMPH NODE BIOPSY Right 01/24/2016   Procedure: EXCISION MELANOMA Right Shoulder;  Surgeon:  Bellow, MD;  Location: [facility] ORS;  Service: General;  Laterality: Right;  . LAPAROSCOPIC NEPHRECTOMY, HAND ASSISTED Left 04/24/2016   Procedure: HAND ASSISTED LAPAROSCOPIC NEPHRECTOMY;  Surgeon: Hollice Espy, MD;  Location: [facility] ORS;  Service: Urology;  Laterality: Left;  Marland Kitchen VIDEO ASSISTED THORACOSCOPY (VATS)/THOROCOTOMY Left 03/08/2016   Procedure: VIDEO ASSISTED THORACOSCOPY (VATS) with lung biopsy - Left ;  Surgeon:  Bellow, MD;  Location: [facility] ORS;  Service: General;  Laterality: Left;    Prior to Admission medications   Medication Sig Start Date End Date Taking? Authorizing Provider  acetaminophen (TYLENOL) 500 MG tablet Take 500 mg by mouth every 6 (six) hours as needed for moderate pain.   Yes [provider]  albuterol (PROVENTIL HFA;VENTOLIN HFA) 108 (90 Base) MCG/ACT inhaler Inhale 2 puffs into the lungs every 6 (six) hours as needed for wheezing or shortness of breath. 02/21/17  Yes Cammie Sickle, MD  amLODipine (NORVASC) 5 MG tablet Take 1 tablet (5 mg total) by mouth 2 (two) times daily. 04/18/17  Yes Cammie Sickle, MD  aspirin EC 81 MG tablet Take 81 mg by mouth  daily.   Yes [provider]  atorvastatin (LIPITOR) 40 MG tablet Take 40 mg by mouth daily.   Yes [provider]  cabozantinib S-Malate 60 MG TABS Take 60 mg by mouth daily. Take on empty stomach [1 hour before and 2 hours after a meal] 04/04/17  Yes Cammie Sickle, MD  clopidogrel (PLAVIX) 75 MG tablet Take 75 mg by mouth daily.   Yes [provider]  docusate sodium (COLACE) 100  MG capsule Take 1 capsule (100 mg total) by mouth 2 (two) times daily. 06/05/17  Yes Hollice Espy, MD  Fluticasone-Salmeterol (ADVAIR DISKUS) 500-50 MCG/DOSE AEPB Inhale 1 puff into the lungs 2 (two) times daily. 03/07/17  Yes Cammie Sickle, MD  ipratropium-albuterol (DUONEB) 0.5-2.5 (3) MG/3ML SOLN Take 3 mLs by nebulization every 4 (four) hours as needed. 04/04/17  Yes Cammie Sickle, MD  loratadine (CLARITIN) 10 MG tablet Take 10 mg by mouth daily.   Yes [provider]  losartan (COZAAR) 50 MG tablet Take 50 mg by mouth every morning.  08/03/15  Yes [provider]  magic mouthwash w/lidocaine SOLN Take 5 mLs by mouth 4 (four) times daily. 07/20/16  Yes Cammie Sickle, MD  MELATONIN PO Take 10 mg by mouth at bedtime.    Yes [provider]  metoprolol succinate (TOPROL-XL) 50 MG 24 hr tablet Take 50 mg by mouth every morning.  10/17/15  Yes [provider]  nitroGLYCERIN (NITROSTAT) 0.4 MG SL tablet Place 0.4 mg under the tongue every 5 (five) minutes as needed.  04/26/15  Yes [provider]  predniSONE (DELTASONE) 20 MG tablet Take 2 tablets (40 mg total) by mouth daily. 05/09/17  Yes Sindy Guadeloupe, MD  sertraline (ZOLOFT) 100 MG tablet Take 200 mg by mouth daily.  07/26/16  Yes [provider]  tamsulosin (FLOMAX) 0.4 MG CAPS capsule Take 0.4 mg by mouth every morning.  01/24/15  Yes [provider]  chlorpheniramine-HYDROcodone (TUSSIONEX) 10-8 MG/5ML SUER Take 5 mLs by mouth every 12 (twelve) hours as needed for cough. Patient not taking: Reported on 06/11/2017 04/16/17   Cammie Sickle, MD     Allergies Lisinopril and Other  Family History  Problem Relation Age of Onset  . Hodgkin's lymphoma Daughter 5  . Kidney cancer Neg Hx   . Kidney disease Neg Hx   . Prostate cancer Neg Hx     Social History Social History  Substance Use Topics  . Smoking status: Former Smoker    Types: Cigarettes    Start  date: 01/18/1956    Quit date: 01/17/1969  . Smokeless tobacco: Never Used  . Alcohol use No     Comment: BEER OCC    Review of Systems  Constitutional: No fever/chills Eyes: No visual changes.  ENT: No Neck pain Cardiovascular: Denies chest pain. Respiratory: Denies shortness of breath. Gastrointestinal: No abdominal pain.  No nausea, no vomiting.   Genitourinary: Negative for dysuria. Musculoskeletal: Negative for back pain. Skin: Healing abrasion to the forehead Neurological: Negative for headaches    ____________________________________________   PHYSICAL EXAM:  VITAL SIGNS: ED Triage Vitals  Enc Vitals Group     BP 06/11/17 1021 118/76     Pulse Rate 06/11/17 1021 62     Resp 06/11/17 1021 20     Temp 06/11/17 1021 97.7 F (36.5 C)     Temp Source 06/11/17 1021 Oral     SpO2 06/11/17 1021 95 %  Weight 06/11/17 1022 97.1 kg (214 lb)     Height 06/11/17 1022 1.854 m (6\' 1" )     Head Circumference --      Peak Flow --      Pain Score 06/11/17 1021 0     Pain Loc --      Pain Edu? --      Excl. in [place]? --     Constitutional: Alert and oriented. No acute distress. Pleasant and interactive Eyes: Conjunctivae are normal.  Head: Healing abrasion to the left anterior forehead,he did get swelling Nose: No congestion/rhinnorhea. No nasal injury Mouth/Throat: Mucous membranes are moist.   Neck:  Painless ROM Cardiovascular: Normal rate, regular rhythm. Grossly normal heart sounds.  Good peripheral circulation. Respiratory: Normal respiratory effort.  No retractions. Lungs CTAB. Gastrointestinal: Soft and nontender. No distention.  No CVA tenderness. Genitourinary: deferred Musculoskeletal: No lower extremity tenderness nor edema.  Warm and well perfused Neurologic:  Normal speech and language. No gross focal neurologic deficits are appreciated.  Skin:  Skin is warm, dry and intact. No rash noted. Psychiatric: Mood and affect are normal. Speech and behavior are  normal.  ____________________________________________   LABS (all labs ordered are listed, but only abnormal results are displayed)  Labs Reviewed  CBC WITH DIFFERENTIAL/PLATELET - Abnormal; Notable for the following:       Result Value   HCT 39.6 (*)    RDW 26.2 (*)    Platelets 140 (*)    All other components within normal limits  COMPREHENSIVE METABOLIC PANEL - Abnormal; Notable for the following:    Chloride 100 (*)    Glucose, Bld 135 (*)    GFR calc non Af Amer 59 (*)    All other components within normal limits  URINALYSIS, COMPLETE (UACMP) WITH MICROSCOPIC - Abnormal; Notable for the following:    Color, Urine YELLOW (*)    APPearance CLEAR (*)    Bacteria, UA RARE (*)    Squamous Epithelial / LPF 0-5 (*)    All other components within normal limits   ____________________________________________  EKG  ED ECG REPORT I, Lavonia Drafts, the attending physician, personally viewed and interpreted this ECG.  Date: 06/11/2017 EKG Time: 10:37 AM Rate: 61 Rhythm: normal sinus rhythm QRS Axis: normal Intervals: normal ST/T Wave abnormalities: normal Narrative Interpretation: unremarkable  ____________________________________________  RADIOLOGY  CT head unremarkable Chest x-ray unremarkable ____________________________________________   PROCEDURES  Procedure(s) performed: No    Critical Care performed:No ____________________________________________   INITIAL IMPRESSION / ASSESSMENT AND PLAN / ED COURSE  Pertinent labs & imaging results that were available during my care of the patient were reviewed by me and considered in my medical decision making (see chart for details).  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 Overall well-appearing and in no acute distress. Afebrile vitals normal. Exam is reassuring. We'll obtain CT head, chest x-ray, urinalysis, lab work and reevaluate.  Patient's lab work is  unremarkable and he remains well-appearing and in no distress. Imaging does not show any acute abnormalities. Discussed with family and they are comfortable taking the patient home. He has outpatient follow-up arranged. He is to return to the emergency Department if any worsening of his symptoms    ____________________________________________   FINAL CLINICAL IMPRESSION(S) / ED DIAGNOSES  Final diagnoses:  Confusion      NEW MEDICATIONS STARTED DURING THIS VISIT:  Discharge Medication List as of 06/11/2017  1:25 PM  Note:  This document was prepared using Dragon voice recognition software and may include unintentional dictation errors.    Lavonia Drafts, MD 06/11/17 [phone]

## 2017-06-11 NOTE — ED Triage Notes (Signed)
Pt to ed with c/o confusion increasing over the past 2 weeks, pt with hx of lung cancer and current treatment for same.  Pt alert and oriented at triage.  Per family pt has been tired and wants to sleep continuously.

## 2017-06-11 NOTE — Telephone Encounter (Signed)
Glen Davis called to report that Glen Davis is having confusion, weakness and talking out of his head. His O2 sats are 83 - 89 %. Asking what to do.   I spoke with Vickki Muff, RN with Dr Rogue Bussing and it was suggested he be taken to the ER.   I called Glen Davis back and she said she would get him up and take him there.

## 2017-06-17 DIAGNOSIS — R296 Repeated falls: Secondary | ICD-10-CM | POA: Diagnosis not present

## 2017-06-17 DIAGNOSIS — C642 Malignant neoplasm of left kidney, except renal pelvis: Secondary | ICD-10-CM | POA: Diagnosis not present

## 2017-06-17 DIAGNOSIS — I10 Essential (primary) hypertension: Secondary | ICD-10-CM | POA: Diagnosis not present

## 2017-06-17 DIAGNOSIS — E781 Pure hyperglyceridemia: Secondary | ICD-10-CM | POA: Diagnosis not present

## 2017-06-17 DIAGNOSIS — F330 Major depressive disorder, recurrent, mild: Secondary | ICD-10-CM | POA: Diagnosis not present

## 2017-06-17 DIAGNOSIS — Z0000 Encounter for general adult medical examination without abnormal findings: Secondary | ICD-10-CM | POA: Diagnosis not present

## 2017-06-17 DIAGNOSIS — F418 Other specified anxiety disorders: Secondary | ICD-10-CM | POA: Diagnosis not present

## 2017-06-17 DIAGNOSIS — R7309 Other abnormal glucose: Secondary | ICD-10-CM | POA: Diagnosis not present

## 2017-06-17 DIAGNOSIS — C7800 Secondary malignant neoplasm of unspecified lung: Secondary | ICD-10-CM | POA: Diagnosis not present

## 2017-06-18 ENCOUNTER — Ambulatory Visit (INDEPENDENT_AMBULATORY_CARE_PROVIDER_SITE_OTHER): Payer: PPO | Admitting: Internal Medicine

## 2017-06-18 ENCOUNTER — Ambulatory Visit
Admission: RE | Admit: 2017-06-18 | Discharge: 2017-06-18 | Disposition: A | Discharge status: Home / Self Care | Payer: PPO | Source: Ambulatory Visit | Attending: Internal Medicine | Admitting: Internal Medicine

## 2017-06-18 ENCOUNTER — Encounter: Payer: Self-pay | Admitting: Internal Medicine

## 2017-06-18 VITALS — BP 128/88 | HR 77 | Resp 16 | Ht 73.0 in | Wt 212.0 lb

## 2017-06-18 DIAGNOSIS — C7800 Secondary malignant neoplasm of unspecified lung: Secondary | ICD-10-CM | POA: Insufficient documentation

## 2017-06-18 DIAGNOSIS — C3490 Malignant neoplasm of unspecified part of unspecified bronchus or lung: Secondary | ICD-10-CM | POA: Diagnosis not present

## 2017-06-18 DIAGNOSIS — J9601 Acute respiratory failure with hypoxia: Secondary | ICD-10-CM

## 2017-06-18 DIAGNOSIS — R7309 Other abnormal glucose: Secondary | ICD-10-CM | POA: Diagnosis not present

## 2017-06-18 DIAGNOSIS — J449 Chronic obstructive pulmonary disease, unspecified: Secondary | ICD-10-CM

## 2017-06-18 DIAGNOSIS — E781 Pure hyperglyceridemia: Secondary | ICD-10-CM | POA: Diagnosis not present

## 2017-06-18 DIAGNOSIS — R0602 Shortness of breath: Secondary | ICD-10-CM | POA: Diagnosis not present

## 2017-06-18 MED ORDER — TIOTROPIUM BROMIDE MONOHYDRATE 1.25 MCG/ACT IN AERS: 1.2500 | INHALATION_SPRAY | Freq: Two times a day (BID) | RESPIRATORY_TRACT | 12 refills | Status: DC

## 2017-06-18 MED ORDER — IOPAMIDOL (ISOVUE-370) INJECTION 76%
75.0000 mL | Freq: Once | INTRAVENOUS | Status: AC | PRN
  Administered 2017-06-18: 75 mL via INTRAVENOUS

## 2017-06-18 NOTE — Progress Notes (Signed)
[facility] Consultation      Date: 06/18/2017,   MRN# [id number] Glen Davis [birth year] Code Status:  Code Status History    Date Active Date Inactive Code Status Order ID Comments User Context   03/08/2016  1:29 PM 03/10/2016  2:21 PM Full Code [id number]  Robert Bellow, MD Inpatient     Hosp day:@LENGTHOFSTAYDAYS @ Referring MD: @ATDPROV @     PCP:      AdmissionWeight: 212 lb (96.2 kg)                 CurrentWeight: 212 lb (96.2 kg) Glen Davis is a 76 y.o. old male seen in consultation for cough at the request of Dr. Rogue Davis     CHIEF COMPLAINT:  Follow up COPD    Previous history historyrevious history of present illness 76 yo white male with previosu smoking histiory 2 ppd for 20 years Patient has h/o pulm nodules mets from L renal cell carcinoma-nodules are stable since last scan Patient has started on new Immunotherapy that has side affect of cough, however, I believe that the cough is improving with time   History of present illness Patient has progressive dyspnea and exertion over the last several months has increased work of breathing and upon further evaluation patient had an bleeding pulse ox that revealed hypoxia which is much different from a 6 minute walk test he did in May 2018 Has been a significant change in his respiratory status since his last visit Signs of infection at this time  I will need to assess for PE at this time with CT of the chest  Office SPiro shows ratio of 77% Fev1 2.7L 85% predicted fvC 3.5L 80% predicted fef25/75 2.3L 99%predicted  Interpretation-flow volumes may suggest obstruction  No lower ext swelling noted   No   No signs o f Current Outpatient Prescriptions:  .  acetaminophen (TYLENOL) 500 MG tablet, Take 500 mg by mouth every 6 (six) hours as needed for moderate pain., Disp: , Rfl:  .  albuterol (PROVENTIL HFA;VENTOLIN HFA) 108 (90 Base) MCG/ACT inhaler, Inhale 2 puffs into the lungs every  6 (six) hours as needed for wheezing or shortness of breath., Disp: 1 Inhaler, Rfl: 2 .  amLODipine (NORVASC) 5 MG tablet, Take 1 tablet (5 mg total) by mouth 2 (two) times daily., Disp: 60 tablet, Rfl: 0 .  aspirin EC 81 MG tablet, Take 81 mg by mouth daily., Disp: , Rfl:  .  atorvastatin (LIPITOR) 40 MG tablet, Take 40 mg by mouth daily., Disp: , Rfl:  .  cabozantinib S-Malate 60 MG TABS, Take 60 mg by mouth daily. Take on empty stomach [1 hour before and 2 hours after a meal], Disp: 30 tablet, Rfl: 3 .  chlorpheniramine-HYDROcodone (TUSSIONEX) 10-8 MG/5ML SUER, Take 5 mLs by mouth every 12 (twelve) hours as needed for cough., Disp: 140 mL, Rfl: 0 .  clopidogrel (PLAVIX) 75 MG tablet, Take 75 mg by mouth daily., Disp: , Rfl:  .  docusate sodium (COLACE) 100 MG capsule, Take 1 capsule (100 mg total) by mouth 2 (two) times daily., Disp: 60 capsule, Rfl: 3 .  Fluticasone-Salmeterol (ADVAIR DISKUS) 500-50 MCG/DOSE AEPB, Inhale 1 puff into the lungs 2 (two) times daily., Disp: 1 each, Rfl: 3 .  ipratropium-albuterol (DUONEB) 0.5-2.5 (3) MG/3ML SOLN, Take 3 mLs by nebulization every 4 (four) hours as needed., Disp: 360 mL, Rfl: 3 .  loratadine (CLARITIN) 10 MG tablet, Take 10 mg by mouth daily., Disp: ,  Rfl:  .  losartan (COZAAR) 50 MG tablet, Take 50 mg by mouth every morning. , Disp: , Rfl:  .  magic mouthwash w/lidocaine SOLN, Take 5 mLs by mouth 4 (four) times daily., Disp: 480 mL, Rfl: 3 .  MELATONIN PO, Take 10 mg by mouth at bedtime. , Disp: , Rfl:  .  metoprolol succinate (TOPROL-XL) 50 MG 24 hr tablet, Take 50 mg by mouth every morning. , Disp: , Rfl:  .  nitroGLYCERIN (NITROSTAT) 0.4 MG SL tablet, Place 0.4 mg under the tongue every 5 (five) minutes as needed. , Disp: , Rfl:  .  predniSONE (DELTASONE) 20 MG tablet, Take 2 tablets (40 mg total) by mouth daily., Disp: 40 tablet, Rfl: 0 .  sertraline (ZOLOFT) 100 MG tablet, Take 200 mg by mouth daily. , Disp: , Rfl:  .  tamsulosin (FLOMAX) 0.4  MG CAPS capsule, Take 0.4 mg by mouth every morning. , Disp: , Rfl:     ALLERGIES   Lisinopril and Other     REVIEW OF SYSTEMS   Review of Systems  Constitutional: Negative for chills, diaphoresis, fever, malaise/fatigue and weight loss.  HENT: Negative for congestion and hearing loss.   Eyes: Negative for blurred vision and double vision.  Respiratory: Positive for cough and shortness of breath. Negative for hemoptysis, sputum production and wheezing.   Cardiovascular: Negative for chest pain, palpitations and orthopnea.  Gastrointestinal: Negative for heartburn.  Skin: Negative for rash.  Neurological: Negative for weakness.  All other systems reviewed and are negative.    VS: BP 128/88 (BP Location: Left Arm, Cuff Size: Normal)   Pulse 77   Resp 16   Ht 6\' 1"  (1.854 m)   Wt 212 lb (96.2 kg)   SpO2 94%   BMI 27.97 kg/m      PHYSICAL EXAM  Physical Exam  Constitutional: He is oriented to person, place, and time. He appears well-developed and well-nourished. No distress.  HENT:  Head: Atraumatic.  Cardiovascular: Normal rate, regular rhythm and normal heart sounds.   No murmur heard. Pulmonary/Chest: Effort normal and breath sounds normal. No stridor. No respiratory distress. He has no wheezes.  Musculoskeletal: Normal range of motion. He exhibits no edema.  Neurological: He is alert and oriented to person, place, and time. No cranial nerve deficit.  Skin: Skin is warm. He is not diaphoretic.  Psychiatric: He has a normal mood and affect.             IMAGING    CTc hest 03/05/17 I have Independently reviewed images of  CT chest   Interpretation:b/l lung nodules, stable in appearance from previous scan Areas of emphysema    ASSESSMENT/PLAN   76 yo pleasant white male with chronic cough most likely post viral infection with underlying mild COPD gold stage A with stable bilateral pulmonary nodules from metastatic renal cell cancer, however at this  time patient has increased work of breathing increased shortness of breath dyspnea and exertion and now with progressive hypoxic respiratory failure but is stable at this time and will need 3 L of oxygen with exertion and 2 L of oxygen at nighttime  I am concerned that he may have tumor thrombus or PE and will need CT of his chest to rule out PE  1.continue Immunotherapy and follow up with Oncology  2.continue therapy for mild COPD with advair, albuterol as needed -Continue Advair therapy Will start Spiriva therapy Albuterol as needed  3.due to his new found hypoxia hypoxia Will  start oxygen therapy Check  CT scans of chest to rule out PE   Follow up in 3 months Patient/Family are satisfied with Plan of action and management. All questions answered  Corrin Parker, M.D.  [facility]  Medical Director [place] Director [facility]io-P[facility] Department

## 2017-06-18 NOTE — Patient Instructions (Addendum)
Start Spiriva estimate 1.25 Will need oxygen daytime with exertion and also nighttime CT chest assess for PE

## 2017-06-19 DIAGNOSIS — R0602 Shortness of breath: Secondary | ICD-10-CM | POA: Diagnosis not present

## 2017-06-19 DIAGNOSIS — C642 Malignant neoplasm of left kidney, except renal pelvis: Secondary | ICD-10-CM | POA: Diagnosis not present

## 2017-06-19 DIAGNOSIS — R05 Cough: Secondary | ICD-10-CM | POA: Diagnosis not present

## 2017-06-19 DIAGNOSIS — J449 Chronic obstructive pulmonary disease, unspecified: Secondary | ICD-10-CM | POA: Diagnosis not present

## 2017-06-20 ENCOUNTER — Inpatient Hospital Stay: Payer: PPO

## 2017-06-20 ENCOUNTER — Inpatient Hospital Stay: Payer: PPO | Admitting: Internal Medicine

## 2017-06-21 ENCOUNTER — Telehealth: Payer: Self-pay | Admitting: *Deleted

## 2017-06-21 NOTE — Telephone Encounter (Signed)
Per Dr. Burlene Arnt patient does not need to continue prednisone. Wife verbalized understanding.

## 2017-06-25 ENCOUNTER — Other Ambulatory Visit: Payer: Self-pay | Admitting: *Deleted

## 2017-06-25 DIAGNOSIS — C642 Malignant neoplasm of left kidney, except renal pelvis: Secondary | ICD-10-CM

## 2017-06-26 ENCOUNTER — Inpatient Hospital Stay (HOSPITAL_BASED_OUTPATIENT_CLINIC_OR_DEPARTMENT_OTHER): Payer: PPO | Admitting: Internal Medicine

## 2017-06-26 ENCOUNTER — Inpatient Hospital Stay: Payer: PPO | Attending: Internal Medicine

## 2017-06-26 VITALS — BP 119/79 | HR 60 | Temp 97.6°F | Resp 20 | Ht 73.0 in | Wt 207.8 lb

## 2017-06-26 DIAGNOSIS — R630 Anorexia: Secondary | ICD-10-CM

## 2017-06-26 DIAGNOSIS — K1230 Oral mucositis (ulcerative), unspecified: Secondary | ICD-10-CM | POA: Diagnosis not present

## 2017-06-26 DIAGNOSIS — C642 Malignant neoplasm of left kidney, except renal pelvis: Secondary | ICD-10-CM

## 2017-06-26 DIAGNOSIS — L271 Localized skin eruption due to drugs and medicaments taken internally: Secondary | ICD-10-CM

## 2017-06-26 DIAGNOSIS — R634 Abnormal weight loss: Secondary | ICD-10-CM

## 2017-06-26 DIAGNOSIS — Z7982 Long term (current) use of aspirin: Secondary | ICD-10-CM | POA: Insufficient documentation

## 2017-06-26 DIAGNOSIS — Z9221 Personal history of antineoplastic chemotherapy: Secondary | ICD-10-CM

## 2017-06-26 DIAGNOSIS — J449 Chronic obstructive pulmonary disease, unspecified: Secondary | ICD-10-CM | POA: Diagnosis not present

## 2017-06-26 DIAGNOSIS — I2510 Atherosclerotic heart disease of native coronary artery without angina pectoris: Secondary | ICD-10-CM | POA: Insufficient documentation

## 2017-06-26 DIAGNOSIS — Z87891 Personal history of nicotine dependence: Secondary | ICD-10-CM | POA: Insufficient documentation

## 2017-06-26 DIAGNOSIS — R197 Diarrhea, unspecified: Secondary | ICD-10-CM

## 2017-06-26 DIAGNOSIS — Z79899 Other long term (current) drug therapy: Secondary | ICD-10-CM | POA: Diagnosis not present

## 2017-06-26 DIAGNOSIS — Z87442 Personal history of urinary calculi: Secondary | ICD-10-CM | POA: Insufficient documentation

## 2017-06-26 DIAGNOSIS — Z85820 Personal history of malignant melanoma of skin: Secondary | ICD-10-CM

## 2017-06-26 DIAGNOSIS — Z905 Acquired absence of kidney: Secondary | ICD-10-CM | POA: Diagnosis not present

## 2017-06-26 DIAGNOSIS — I10 Essential (primary) hypertension: Secondary | ICD-10-CM | POA: Diagnosis not present

## 2017-06-26 DIAGNOSIS — C7800 Secondary malignant neoplasm of unspecified lung: Secondary | ICD-10-CM | POA: Diagnosis not present

## 2017-06-26 LAB — CBC WITH DIFFERENTIAL/PLATELET
BASOS PCT: 1 %
Basophils Absolute: 0 10*3/uL (ref 0–0.1)
EOS ABS: 0.1 10*3/uL (ref 0–0.7)
EOS PCT: 1 %
HEMATOCRIT: 35.9 % — AB (ref 40.0–52.0)
Hemoglobin: 12.7 g/dL — ABNORMAL LOW (ref 13.0–18.0)
Lymphocytes Relative: 24 %
Lymphs Abs: 1.3 10*3/uL (ref 1.0–3.6)
MCH: 31.3 pg (ref 26.0–34.0)
MCHC: 35.5 g/dL (ref 32.0–36.0)
MCV: 88.3 fL (ref 80.0–100.0)
MONO ABS: 0.3 10*3/uL (ref 0.2–1.0)
MONOS PCT: 6 %
Neutro Abs: 3.8 10*3/uL (ref 1.4–6.5)
Neutrophils Relative %: 68 %
Platelets: 149 10*3/uL — ABNORMAL LOW (ref 150–440)
RBC: 4.07 MIL/uL — ABNORMAL LOW (ref 4.40–5.90)
RDW: 26.1 % — AB (ref 11.5–14.5)
WBC: 5.6 10*3/uL (ref 3.8–10.6)

## 2017-06-26 LAB — COMPREHENSIVE METABOLIC PANEL
ALBUMIN: 3.6 g/dL (ref 3.5–5.0)
ALT: 56 U/L (ref 17–63)
ANION GAP: 9 (ref 5–15)
AST: 43 U/L — ABNORMAL HIGH (ref 15–41)
Alkaline Phosphatase: 90 U/L (ref 38–126)
BILIRUBIN TOTAL: 0.7 mg/dL (ref 0.3–1.2)
BUN: 15 mg/dL (ref 6–20)
CALCIUM: 9 mg/dL (ref 8.9–10.3)
CO2: 24 mmol/L (ref 22–32)
Chloride: 98 mmol/L — ABNORMAL LOW (ref 101–111)
Creatinine, Ser: 1.29 mg/dL — ABNORMAL HIGH (ref 0.61–1.24)
GFR calc non Af Amer: 52 mL/min — ABNORMAL LOW (ref >60)
Glucose, Bld: 101 mg/dL — ABNORMAL HIGH (ref 65–99)
Potassium: 4 mmol/L (ref 3.5–5.1)
SODIUM: 131 mmol/L — AB (ref 135–145)
TOTAL PROTEIN: 6.6 g/dL (ref 6.5–8.1)

## 2017-06-26 NOTE — Assessment & Plan Note (Addendum)
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 No clinical evidence of progression. Patient tolerating poorly with multiple side effects [C discussion below].  # Recommend holding cabo x2 weeks. We'll plan to get a CT scan in approximately 3 months since the start of the treatment/N of August/early September. We'll order a CT scan at next visit  # Diarrhea-at least 2-3 loose stools a day. Recommend Imodium.   # Hand-foot syndrome grade 2-3- recommend Vaseline/udder cream. Also recommend Kenalog ointment  # elevated BP-blood pressure today better control.  # Severe fatigue/poor appetite- start patient on prednisone 20 mg a day for 20 days.  # mucoistis- recommend baking soda and salt water rinses; continue Magic mouthwash.   # Follow with me in 2 weeks with labs/symptom management.

## 2017-06-26 NOTE — Progress Notes (Signed)
[place] OFFICE PROGRESS NOTE  Patient Care Team: Dion Body, MD as PCP - General (Family Medicine) Jannet Mantis, MD (Dermatology) Bary Castilla Forest Gleason, MD (General Surgery) Hollice Espy, MD as Consulting Physician (Urology)  No matching staging information was found for the patient.   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  male pleasant patient above history of  renal cell cancer status post left nephrectomy- With metastases to the lung Currently on Cabo 60 mg a day Since May 31 is here for follow-up.  In the interim he was evaluated by pulmonary; started on 2 L of home oxygen.  Patient complains of diarrhea at least 2-3 loose stools a day. His been losing weight. Poor appetite. Complains of soreness the mall.  Complains of pain complains and soles; most in the soles. Skin desquamation noted. Complains of extreme fatigue.   REVIEW OF SYSTEMS:  A complete 10 point review of system is done which is negative except mentioned above/history of present illness.   PAST MEDICAL HISTORY :  Past Medical History:  Diagnosis Date  . CAD (coronary artery disease)   . Cancer ([place])    left arm  . COPD (chronic obstructive pulmonary disease) ([place])   .  Hypertension   . Kidney stone   . Lung cancer ([place])   . Melanoma ([place]) 01/24/2016   right shoulder  . Thyroid nodule 02/02/2016   left BENIGN THYROID NODULE by FNA    PAST SURGICAL HISTORY :   Past Surgical History:  Procedure Laterality Date  . arm surgery Left    arm  . cardiac stents  2011   Angioplasty / Stenting Femoral-X2  . COLONOSCOPY  04/01/12  . CORONARY ANGIOPLASTY    . EXCISION MELANOMA WITH SENTINEL LYMPH NODE BIOPSY Right 01/24/2016   Procedure: EXCISION MELANOMA Right Shoulder;  Surgeon: Robert Bellow, MD;  Location: [facility] ORS;  Service: General;  Laterality: Right;  . LAPAROSCOPIC NEPHRECTOMY, HAND ASSISTED Left 04/24/2016   Procedure: HAND ASSISTED LAPAROSCOPIC NEPHRECTOMY;  Surgeon: Hollice Espy, MD;  Location: [facility] ORS;  Service: Urology;  Laterality: Left;  Marland Kitchen VIDEO ASSISTED THORACOSCOPY (VATS)/THOROCOTOMY Left 03/08/2016   Procedure: VIDEO ASSISTED THORACOSCOPY (VATS) with lung biopsy - Left ;  Surgeon: Robert Bellow, MD;  Location: [facility] ORS;  Service: General;  Laterality: Left;    FAMILY HISTORY :   Family History  Problem Relation Age of Onset  . Hodgkin's lymphoma Daughter 5  . Kidney cancer Neg Hx   . Kidney disease Neg Hx   . Prostate cancer Neg Hx     SOCIAL HISTORY:   Social History  Substance Use Topics  . Smoking status: Former Smoker    Types: Cigarettes    Start date: 01/18/1956    Quit date: 01/17/1969  .  Smokeless tobacco: Never Used  . Alcohol use No     Comment: BEER OCC    ALLERGIES:  is allergic to lisinopril and other.  MEDICATIONS:  Current Outpatient Prescriptions  Medication Sig Dispense Refill  . acetaminophen (TYLENOL) 500 MG tablet Take 500 mg by mouth every 6 (six) hours as needed for moderate pain.    Marland Kitchen albuterol (PROVENTIL HFA;VENTOLIN HFA) 108 (90 Base) MCG/ACT inhaler Inhale 2 puffs into the lungs every 6 (six) hours as needed for wheezing or shortness of breath. 1 Inhaler 2  . amLODipine (NORVASC) 5 MG tablet Take  1 tablet (5 mg total) by mouth 2 (two) times daily. 60 tablet 0  . aspirin EC 81 MG tablet Take 81 mg by mouth daily.    Marland Kitchen atorvastatin (LIPITOR) 40 MG tablet Take 40 mg by mouth daily.    . cabozantinib S-Malate 60 MG TABS Take 60 mg by mouth daily. Take on empty stomach [1 hour before and 2 hours after a meal] 30 tablet 3  . chlorpheniramine-HYDROcodone (TUSSIONEX) 10-8 MG/5ML SUER Take 5 mLs by mouth every 12 (twelve) hours as needed for cough. 140 mL 0  . clopidogrel (PLAVIX) 75 MG tablet Take 75 mg by mouth daily.    Marland Kitchen docusate sodium (COLACE) 100 MG capsule Take 1 capsule (100 mg total) by mouth 2 (two) times daily. 60 capsule 3  . Fluticasone-Salmeterol (ADVAIR DISKUS) 500-50 MCG/DOSE AEPB Inhale 1 puff into the lungs 2 (two) times daily. 1 each 3  . ipratropium-albuterol (DUONEB) 0.5-2.5 (3) MG/3ML SOLN Take 3 mLs by nebulization every 4 (four) hours as needed. 360 mL 3  . loratadine (CLARITIN) 10 MG tablet Take 10 mg by mouth daily.    Marland Kitchen losartan (COZAAR) 50 MG tablet Take 50 mg by mouth every morning.     . magic mouthwash w/lidocaine SOLN Take 5 mLs by mouth 4 (four) times daily. 480 mL 3  . MELATONIN PO Take 10 mg by mouth at bedtime.     . metoprolol succinate (TOPROL-XL) 50 MG 24 hr tablet Take 50 mg by mouth every morning.     . nitroGLYCERIN (NITROSTAT) 0.4 MG SL tablet Place 0.4 mg under the tongue every 5 (five) minutes as needed.     . predniSONE (DELTASONE) 20 MG tablet Take 2 tablets (40 mg total) by mouth daily. 40 tablet 0  . sertraline (ZOLOFT) 100 MG tablet Take 200 mg by mouth daily.     . tamsulosin (FLOMAX) 0.4 MG CAPS capsule Take 0.4 mg by mouth every morning.     . Tiotropium Bromide Monohydrate (SPIRIVA RESPIMAT) 1.25 MCG/ACT AERS Inhale 1.25 Act into the lungs 2 (two) times daily. 1 Inhaler 12   No current facility-administered medications for this visit.     PHYSICAL EXAMINATION: ECOG PERFORMANCE STATUS: 0 - Asymptomatic  There were no vitals taken for this  visit.  There were no vitals filed for this visit.  GENERAL: Well-nourished well-developed; Alert, no distress and comfortable. Accompanied by his daughter; wife.  EYES: no pallor or icterus OROPHARYNX: no thrush or ulceration; NECK: supple, no masses felt LYMPH:  no palpable lymphadenopathy in the cervical, axillary or inguinal regions LUNGS: clear to auscultation and  No wheeze or crackles HEART/CVS: regular rate & rhythm and no murmurs; No lower extremity edema ABDOMEN:abdomen soft, non-tender and normal bowel sounds Musculoskeletal:no cyanosis of digits and no clubbing  PSYCH: alert & oriented x 3 with fluent speech NEURO: no focal motor/sensory deficits SKIN:  moderate erythema of  his bilateral lower extremities;  Desquamation Noted in the feet   LABORATORY DATA:  I have reviewed the data as listed    Component Value Date/Time   NA 131 (L) 06/26/2017 1432   NA 146 (H) 05/25/2016 1514   K 4.0 06/26/2017 1432   CL 98 (L) 06/26/2017 1432   CO2 24 06/26/2017 1432   GLUCOSE 101 (H) 06/26/2017 1432   BUN 15 06/26/2017 1432   BUN 19 05/25/2016 1514   CREATININE 1.29 (H) 06/26/2017 1432   CALCIUM 9.0 06/26/2017 1432   PROT 6.6 06/26/2017 1432   ALBUMIN 3.6 06/26/2017 1432   AST 43 (H) 06/26/2017 1432   ALT 56 06/26/2017 1432   ALKPHOS 90 06/26/2017 1432   BILITOT 0.7 06/26/2017 1432   GFRNONAA 52 (L) 06/26/2017 1432   GFRAA >60 06/26/2017 1432    No results found for: SPEP, UPEP  Lab Results  Component Value Date   WBC 5.6 06/26/2017   NEUTROABS 3.8 06/26/2017   HGB 12.7 (L) 06/26/2017   HCT 35.9 (L) 06/26/2017   MCV 88.3 06/26/2017   PLT 149 (L) 06/26/2017      Chemistry      Component Value Date/Time   NA 131 (L) 06/26/2017 1432   NA 146 (H) 05/25/2016 1514   K 4.0 06/26/2017 1432   CL 98 (L) 06/26/2017 1432   CO2 24 06/26/2017 1432   BUN 15 06/26/2017 1432   BUN 19 05/25/2016 1514   CREATININE 1.29 (H) 06/26/2017 1432      Component Value Date/Time    CALCIUM 9.0 06/26/2017 1432   ALKPHOS 90 06/26/2017 1432   AST 43 (H) 06/26/2017 1432   ALT 56 06/26/2017 1432   BILITOT 0.7 06/26/2017 1432     RADIOGRAPHIC STUDIES: I have personally reviewed the radiological images as listed and agreed with the findings in the report. No results found.  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 No clinical evidence of progression. Patient tolerating poorly with multiple side effects [C discussion below].  # Recommend holding cabo x2 weeks. We'll plan to get a CT scan in approximately 3 months since the start of the treatment/N of August/early September. We'll order a CT scan at next visit  # Diarrhea-at least 2-3 loose stools a day. Recommend Imodium.   # Hand-foot syndrome grade 2-3- recommend Vaseline/udder cream. Also recommend Kenalog ointment  # elevated BP-blood pressure today better control.  # Severe fatigue/poor appetite- start patient on prednisone 20 mg a day for 20 days.  # mucoistis- recommend baking soda and salt water rinses; continue Magic mouthwash.   # Follow with me in 2 weeks with labs/symptom management.   No orders of the defined types were placed in this encounter.     Cammie Sickle, MD 06/27/2017 7:46 AM

## 2017-06-27 ENCOUNTER — Telehealth: Payer: Self-pay | Admitting: Internal Medicine

## 2017-06-27 NOTE — Progress Notes (Signed)
Patient here for renal cancer followup. Currently on Cabozantinib. Has not missed any dosing. Patient reports worsening hand/foot syndrome and reports skin blisters sores on pressure point areas in joints in ankle and forearms.  Pt c/o dyspnea. On 3L of oxygen as prescribed by pulmonary.  Pt reports 3+ loose stools per day. Has not taking any imodium AD. Reports dark black stools.

## 2017-06-27 NOTE — Telephone Encounter (Signed)
Please schedule follow-up in 2 weeks/M.D.; no labs.

## 2017-06-27 NOTE — Addendum Note (Signed)
Addended by: Sabino Gasser on: 06/27/2017 08:36 AM   Modules accepted: Orders

## 2017-06-27 NOTE — Telephone Encounter (Signed)
msg sent to sch. team

## 2017-07-02 ENCOUNTER — Ambulatory Visit: Payer: PPO | Admitting: Physical Therapy

## 2017-07-03 ENCOUNTER — Telehealth: Payer: Self-pay | Admitting: *Deleted

## 2017-07-03 NOTE — Telephone Encounter (Signed)
[employer] called to confirm that the cabazantinib is on hold. I contacted [employer] back and left a msg that this medications has been discontinued and hold on by the provider.

## 2017-07-08 ENCOUNTER — Ambulatory Visit: Payer: PPO | Admitting: Physical Therapy

## 2017-07-10 ENCOUNTER — Ambulatory Visit: Payer: PPO | Admitting: Physical Therapy

## 2017-07-10 ENCOUNTER — Other Ambulatory Visit: Payer: Self-pay | Admitting: Internal Medicine

## 2017-07-12 ENCOUNTER — Inpatient Hospital Stay (HOSPITAL_BASED_OUTPATIENT_CLINIC_OR_DEPARTMENT_OTHER): Payer: PPO | Admitting: Internal Medicine

## 2017-07-12 ENCOUNTER — Telehealth: Payer: Self-pay | Admitting: Pharmacist

## 2017-07-12 DIAGNOSIS — J449 Chronic obstructive pulmonary disease, unspecified: Secondary | ICD-10-CM | POA: Diagnosis not present

## 2017-07-12 DIAGNOSIS — I10 Essential (primary) hypertension: Secondary | ICD-10-CM

## 2017-07-12 DIAGNOSIS — R197 Diarrhea, unspecified: Secondary | ICD-10-CM

## 2017-07-12 DIAGNOSIS — R634 Abnormal weight loss: Secondary | ICD-10-CM

## 2017-07-12 DIAGNOSIS — Z79899 Other long term (current) drug therapy: Secondary | ICD-10-CM

## 2017-07-12 DIAGNOSIS — C642 Malignant neoplasm of left kidney, except renal pelvis: Secondary | ICD-10-CM

## 2017-07-12 DIAGNOSIS — K1230 Oral mucositis (ulcerative), unspecified: Secondary | ICD-10-CM

## 2017-07-12 DIAGNOSIS — I2510 Atherosclerotic heart disease of native coronary artery without angina pectoris: Secondary | ICD-10-CM | POA: Diagnosis not present

## 2017-07-12 DIAGNOSIS — Z85820 Personal history of malignant melanoma of skin: Secondary | ICD-10-CM

## 2017-07-12 DIAGNOSIS — Z9221 Personal history of antineoplastic chemotherapy: Secondary | ICD-10-CM | POA: Diagnosis not present

## 2017-07-12 DIAGNOSIS — L271 Localized skin eruption due to drugs and medicaments taken internally: Secondary | ICD-10-CM | POA: Diagnosis not present

## 2017-07-12 DIAGNOSIS — C7800 Secondary malignant neoplasm of unspecified lung: Secondary | ICD-10-CM | POA: Diagnosis not present

## 2017-07-12 DIAGNOSIS — Z87891 Personal history of nicotine dependence: Secondary | ICD-10-CM

## 2017-07-12 DIAGNOSIS — R630 Anorexia: Secondary | ICD-10-CM | POA: Diagnosis not present

## 2017-07-12 DIAGNOSIS — Z7982 Long term (current) use of aspirin: Secondary | ICD-10-CM

## 2017-07-12 DIAGNOSIS — Z87442 Personal history of urinary calculi: Secondary | ICD-10-CM

## 2017-07-12 DIAGNOSIS — Z905 Acquired absence of kidney: Secondary | ICD-10-CM

## 2017-07-12 MED ORDER — CABOZANTINIB S-MALATE 40 MG PO TABS: 40.0000 mg | ORAL_TABLET | Freq: Every day | ORAL | 4 refills | Status: DC

## 2017-07-12 NOTE — Progress Notes (Signed)
[place] OFFICE PROGRESS NOTE  Patient Care Team: Dion Body, MD as PCP - General (Family Medicine) Jannet Mantis, MD (Dermatology) Bary Castilla Forest Gleason, MD (General Surgery) Hollice Espy, MD as Consulting Physician (Urology)  No matching staging information was found for the patient.   Oncology History   # MAY- June 2017- METASTATIC CLEAR CELL; LEFT RENAL CA/STAGE IV; Furhman- G-3;  [bil Pul lung nodules;incidental s/p Bx Dr.Byrnett/Dr.Oaks] s/p Cytoreductive nephrectomy; Dr.Brandon- [id number]; July 11th 2017 CT- Enlarging Lung nodules  # Aug 7th 2017- PAZOPANIB; STOP SEP 2017 [pancreatitis/poor tol]  # OCT 1 week- START NIVO q 2W x4; DEC 8th CT- "Progression"; Continue Opdivo;APRIL-MAY 2018- STABLE LUNG NODULES [stopped sec to severe cough; NO Pneumonitis]  # May 31st 2018- [place] 60mg /day; July 31st CT lung- improved lung nodules; Sep 1st week- cabo- 40mg /day [dose reduced sec to mult intol]  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  male pleasant patient above history of  renal cell cancer status post left nephrectomy- With metastases to the lung Currently on Cabo 60 mg a day Since May 31 is here for follow-up. Patient's cabo was held approximately 2 weeks ago because of multiple side effects.  In the interim he had a CT scan of the chest to pulmonary.   Patient since stopping the cabo- noted to have significant improvement of his diarrhea. Also noted to have improvement of his oral mucositis. He also noted to have improvement of skin rash on his palms and soles. Fatigue is also improved.  REVIEW OF SYSTEMS:  A complete 10 point review of system is done which is negative except mentioned above/history of present illness.   PAST MEDICAL HISTORY :  Past  Medical History:  Diagnosis Date  . CAD (coronary artery disease)   . Cancer ([place])    left arm  . COPD (chronic obstructive pulmonary disease) ([place])   . Hypertension   . Kidney stone   . Lung cancer ([place])   . Melanoma ([place]) 01/24/2016   right shoulder  . Thyroid nodule 02/02/2016   left BENIGN THYROID NODULE by FNA    PAST SURGICAL HISTORY :   Past Surgical History:  Procedure Laterality Date  . arm surgery Left    arm  . cardiac stents  2011   Angioplasty / Stenting Femoral-X2  . COLONOSCOPY  04/01/12  . CORONARY ANGIOPLASTY    . EXCISION MELANOMA WITH SENTINEL LYMPH NODE BIOPSY Right 01/24/2016   Procedure: EXCISION MELANOMA Right Shoulder;  Surgeon: Robert Bellow, MD;  Location: [facility] ORS;  Service: General;  Laterality: Right;  . LAPAROSCOPIC NEPHRECTOMY, HAND ASSISTED Left 04/24/2016   Procedure: HAND ASSISTED LAPAROSCOPIC NEPHRECTOMY;  Surgeon: Hollice Espy, MD;  Location: [facility] ORS;  Service: Urology;  Laterality: Left;  Marland Kitchen VIDEO ASSISTED THORACOSCOPY (VATS)/THOROCOTOMY Left 03/08/2016   Procedure: VIDEO ASSISTED THORACOSCOPY (VATS) with lung biopsy - Left ;  Surgeon: Robert Bellow, MD;  Location: [facility] ORS;  Service: General;  Laterality: Left;    FAMILY HISTORY :   Family History  Problem Relation Age of Onset  . Hodgkin's lymphoma Daughter 5  . Kidney cancer Neg Hx   . Kidney disease Neg Hx   . Prostate cancer Neg Hx     SOCIAL HISTORY:   Social History  Substance Use Topics  . Smoking status: Former Smoker    Types: Cigarettes    Start date: 01/18/1956    Quit date: 01/17/1969  . Smokeless tobacco: Never Used  . Alcohol use No     Comment: BEER OCC    ALLERGIES:  is allergic to lisinopril and other.  MEDICATIONS:  Current Outpatient Prescriptions  Medication Sig Dispense Refill  . acetaminophen (TYLENOL) 500 MG tablet Take 500 mg by mouth every 6 (six) hours as needed for moderate pain.    Marland Kitchen ADVAIR DISKUS 500-50 MCG/DOSE AEPB INHALE 1 PUFF INTO THE  LUNGS 2 (TWO) TIMES DAILY. 60 each 3  . albuterol (PROVENTIL HFA;VENTOLIN HFA) 108 (90 Base) MCG/ACT inhaler Inhale 2 puffs into the lungs every 6 (six) hours as needed for wheezing or shortness of breath. 1 Inhaler 2  . amLODipine (NORVASC) 5 MG tablet Take 1 tablet (5 mg total) by mouth 2 (two) times daily. 60 tablet 0  . aspirin EC 81 MG tablet Take 81 mg by mouth daily.    Marland Kitchen atorvastatin (LIPITOR) 40 MG tablet Take 40 mg by mouth daily.    . clopidogrel (PLAVIX) 75 MG tablet Take 75 mg by mouth daily.    Marland Kitchen docusate sodium (COLACE) 100 MG capsule Take 1 capsule (100 mg total) by mouth 2 (two) times daily. 60 capsule 3  . ipratropium-albuterol (DUONEB) 0.5-2.5 (3) MG/3ML SOLN Take 3 mLs by nebulization every 4 (four) hours as needed. 360 mL 3  . loratadine (CLARITIN) 10 MG tablet Take 10 mg by mouth daily.    Marland Kitchen losartan (COZAAR) 50 MG tablet Take 50 mg by mouth every morning.     . magic mouthwash w/lidocaine SOLN Take 5 mLs by mouth 4 (four) times daily. 480 mL 3  . MELATONIN PO Take 10 mg by mouth at bedtime.     . metoprolol succinate (TOPROL-XL) 50 MG 24 hr tablet Take 50 mg by mouth every morning.     . nitroGLYCERIN (NITROSTAT) 0.4 MG SL tablet Place 0.4 mg under the tongue every 5 (five) minutes as needed.     . OXYGEN Inhale 3 L into the lungs continuous.    . sertraline (ZOLOFT) 100 MG tablet Take 200 mg by mouth daily.     . tamsulosin (FLOMAX) 0.4 MG CAPS capsule Take 0.4 mg by mouth every morning.     . Tiotropium Bromide Monohydrate (SPIRIVA RESPIMAT) 1.25 MCG/ACT AERS Inhale 1.25 Act into the lungs 2 (two) times daily. 1 Inhaler 12  . cabozantinib S-Malate (CABOMETYX) 40 MG TABS Take 40 mg by mouth daily. 30 tablet 4   No current facility-administered medications for this visit.     PHYSICAL EXAMINATION: ECOG PERFORMANCE STATUS: 0 - Asymptomatic  BP (!) 141/83 (BP Location: Right Arm, Patient Position: Sitting)   Pulse 76   Temp 98.4 F (36.9 C) (Tympanic)   Resp 16    Wt 212 lb 9.6 oz (96.4 kg)   BMI 28.05 kg/m   Filed Weights   07/12/17 1449  Weight: 212 lb 9.6 oz (96.4 kg)    GENERAL: Well-nourished well-developed; Alert, no distress and comfortable. Accompanied by his daughter; wife.  EYES: no pallor or icterus OROPHARYNX: no thrush or ulceration; NECK: supple, no masses felt LYMPH:  no palpable lymphadenopathy in the cervical, axillary or inguinal regions LUNGS: clear to auscultation and  No wheeze or crackles HEART/CVS: regular rate & rhythm and no murmurs; No lower extremity edema ABDOMEN:abdomen soft, non-tender and normal bowel sounds Musculoskeletal:no  cyanosis of digits and no clubbing  PSYCH: alert & oriented x 3 with fluent speech NEURO: no focal motor/sensory deficits SKIN:  Skin rash resolved.  LABORATORY DATA:  I have reviewed the data as listed    Component Value Date/Time   NA 131 (L) 06/26/2017 1432   NA 146 (H) 05/25/2016 1514   K 4.0 06/26/2017 1432   CL 98 (L) 06/26/2017 1432   CO2 24 06/26/2017 1432   GLUCOSE 101 (H) 06/26/2017 1432   BUN 15 06/26/2017 1432   BUN 19 05/25/2016 1514   CREATININE 1.29 (H) 06/26/2017 1432   CALCIUM 9.0 06/26/2017 1432   PROT 6.6 06/26/2017 1432   ALBUMIN 3.6 06/26/2017 1432   AST 43 (H) 06/26/2017 1432   ALT 56 06/26/2017 1432   ALKPHOS 90 06/26/2017 1432   BILITOT 0.7 06/26/2017 1432   GFRNONAA 52 (L) 06/26/2017 1432   GFRAA >60 06/26/2017 1432    No results found for: SPEP, UPEP  Lab Results  Component Value Date   WBC 5.6 06/26/2017   NEUTROABS 3.8 06/26/2017   HGB 12.7 (L) 06/26/2017   HCT 35.9 (L) 06/26/2017   MCV 88.3 06/26/2017   PLT 149 (L) 06/26/2017      Chemistry      Component Value Date/Time   NA 131 (L) 06/26/2017 1432   NA 146 (H) 05/25/2016 1514   K 4.0 06/26/2017 1432   CL 98 (L) 06/26/2017 1432   CO2 24 06/26/2017 1432   BUN 15 06/26/2017 1432   BUN 19 05/25/2016 1514   CREATININE 1.29 (H) 06/26/2017 1432      Component Value Date/Time    CALCIUM 9.0 06/26/2017 1432   ALKPHOS 90 06/26/2017 1432   AST 43 (H) 06/26/2017 1432   ALT 56 06/26/2017 1432   BILITOT 0.7 06/26/2017 1432     RADIOGRAPHIC STUDIES: I have personally reviewed the radiological images as listed and agreed with the findings in the report. No results found.  IMPRESSION: No evidence of pulmonary embolism.  Widespread pulmonary metastases, improved.  Emphysema (ICD10-J43.9).   Electronically Signed   By: Julian Hy M.D.   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 No clinical evidence of progression. CT scan lung- July 31st- improvement of the bilateral lung lesions. However patient tolerating cabo 60mg /day poorly with multiple side effects [C discussion below].  # Would recommend cutting down the dose of cabo to 40 mg a day. New prescription given.  # Diarrhea- grade 1-2- secondary to cabo- resolved. Recommend starting cabo low-dose.  # Hand-foot syndrome grade 2-3-improved/resolved- Vaseline/udder cream/ kenalog ointment. Low-dose of cabo.   # elevated BP-blood pressure today better control.  # Severe fatigue/poor appetite- Taper prednisone half a pill a day; and then discontinue.  # mucoistis--improved- continue prophylactic baking soda and salt water rinses; continue Magic mouthwash.   # Follow-up with me 4 weeks/labs.   # I reviewed the blood work- with the patient in detail; also reviewed the imaging independently [as summarized above]; and with the patient in detail.    Orders Placed This Encounter  Procedures  . CBC with Differential/Platelet    Standing Status:   Future    Standing Expiration Date:   07/12/2018  . Comprehensive metabolic panel    Standing Status:   Future    Standing Expiration Date:   07/12/2018  .  Lactate dehydrogenase    Standing Status:   Future    Standing  Expiration Date:   07/12/2018      Cammie Sickle, MD 07/14/2017 8:17 PM

## 2017-07-12 NOTE — Telephone Encounter (Signed)
Oral Oncology Pharmacist Encounter Received refill prescription for cabozantinib 40mg  for the treatment of metastatic clear cell renal cancer in planned duration until disease progression or unacceptable drug toxicity.  Patient started medication 04/17/17 and is tolerating poorly per EPIC documentation (diarrhea and hand-food syndrome). Patient was instructed by MD to hold cabozantinib for 2 weeks then start on lower dose.   Patient will have labs repeated in 2 weeks at his MD visit before restarting his oral chemotherapy.   Current medication list in Epic reviewed, no DDIs with cabozantinib.   Prescription has been e-scribed to the [facility].  Oral Oncology Clinic will continue to follow for insurance authorization, copayment issues, initial counseling and start date.  Darl Pikes, PharmD, BCPS Hematology/Oncology Clinical Pharmacist [facility] [place] [phone]  07/12/2017 4:19 PM

## 2017-07-12 NOTE — Assessment & Plan Note (Addendum)
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 No clinical evidence of progression. CT scan lung- July 31st- improvement of the bilateral lung lesions. However patient tolerating cabo 60mg /day poorly with multiple side effects [C discussion below].  # Would recommend cutting down the dose of cabo to 40 mg a day. New prescription given.  # Diarrhea- grade 1-2- secondary to cabo- resolved. Recommend starting cabo low-dose.  # Hand-foot syndrome grade 2-3-improved/resolved- Vaseline/udder cream/ kenalog ointment. Low-dose of cabo.   # elevated BP-blood pressure today better control.  # Severe fatigue/poor appetite- Taper prednisone half a pill a day; and then discontinue.  # mucoistis--improved- continue prophylactic baking soda and salt water rinses; continue Magic mouthwash.   # Follow-up with me 4 weeks/labs.   # I reviewed the blood work- with the patient in detail; also reviewed the imaging independently [as summarized above]; and with the patient in detail.

## 2017-07-15 ENCOUNTER — Telehealth: Payer: Self-pay | Admitting: Internal Medicine

## 2017-07-15 ENCOUNTER — Ambulatory Visit: Payer: PPO | Admitting: Physical Therapy

## 2017-07-15 MED FILL — CABOMETYX 40 MG TABLET: 40 | 30 days supply | Qty: 30 | Fill #0

## 2017-07-15 NOTE — Telephone Encounter (Signed)
Oral Chemotherapy Pharmacist Encounter  I spoke with patient for overview of refill prescription for cabozantinib 40mg  for the treatment of metastatic clear cell renal cancer in planned duration until disease progression or unacceptable drug toxicity.   Pt was previously filling at an outside pharmacy but the medication is now being filled at [facility]  Pt is doing well and he reports his hand-foot syndrome is better since his treatment break. Reviewed with patient administration, dosing, side effects, safe handling, and monitoring. Patient will take 40 mg by mouth daily on an empty stomach.  Side effects include but not limited to: N/V/D, hand-foot syndrome, fatigue.    Reviewed with patient importance of keeping a medication schedule and plan for any missed doses.  Mr. Horsey voiced understanding and appreciation. All questions answered.  Patient knows to call the office with questions or concerns. Oral Oncology Clinic will continue to follow.  Thank you,  Nuala Alpha, PharmD, BCPS 07/15/2017  2:08 PM Oral Oncology Clinic [phone]

## 2017-07-15 NOTE — Telephone Encounter (Addendum)
Patient has co-pay assistance money thru [employer]. I contacted them to see how much money he has left. He has $5474.14. His copay is $846.00 per month.   Contacted patient to let him know that we will be filling Cabometyx thru [facility]. He would like for [place] to mail his meds to his home address. Meds will be mailed today.  ID # [id number] RX Bin# [id number] PCN# [id number] Group # [id number]  Thru 03/08/2018    [facility] Patient Advocate [phone] 07/15/2017 3:41 PM

## 2017-07-17 ENCOUNTER — Ambulatory Visit: Payer: PPO | Admitting: Physical Therapy

## 2017-07-20 DIAGNOSIS — J449 Chronic obstructive pulmonary disease, unspecified: Secondary | ICD-10-CM | POA: Diagnosis not present

## 2017-07-20 DIAGNOSIS — C642 Malignant neoplasm of left kidney, except renal pelvis: Secondary | ICD-10-CM | POA: Diagnosis not present

## 2017-07-20 DIAGNOSIS — R0602 Shortness of breath: Secondary | ICD-10-CM | POA: Diagnosis not present

## 2017-07-20 DIAGNOSIS — R05 Cough: Secondary | ICD-10-CM | POA: Diagnosis not present

## 2017-07-24 ENCOUNTER — Ambulatory Visit: Payer: PPO | Admitting: Physical Therapy

## 2017-07-29 ENCOUNTER — Ambulatory Visit: Payer: PPO | Admitting: Physical Therapy

## 2017-07-31 ENCOUNTER — Ambulatory Visit: Payer: PPO | Admitting: Physical Therapy

## 2017-08-01 DIAGNOSIS — F418 Other specified anxiety disorders: Secondary | ICD-10-CM | POA: Diagnosis not present

## 2017-08-01 DIAGNOSIS — E781 Pure hyperglyceridemia: Secondary | ICD-10-CM | POA: Diagnosis not present

## 2017-08-01 DIAGNOSIS — R7303 Prediabetes: Secondary | ICD-10-CM | POA: Diagnosis not present

## 2017-08-01 DIAGNOSIS — F330 Major depressive disorder, recurrent, mild: Secondary | ICD-10-CM | POA: Diagnosis not present

## 2017-08-01 DIAGNOSIS — I10 Essential (primary) hypertension: Secondary | ICD-10-CM | POA: Diagnosis not present

## 2017-08-04 ENCOUNTER — Other Ambulatory Visit: Payer: Self-pay | Admitting: Internal Medicine

## 2017-08-05 ENCOUNTER — Ambulatory Visit: Payer: PPO | Admitting: Physical Therapy

## 2017-08-05 NOTE — Telephone Encounter (Signed)
Per last office note dated 07/12/17: # Severe fatigue/poor appetite- Taper prednisone half a pill a day; and then discontinue

## 2017-08-06 MED FILL — CABOMETYX 40 MG TABLET: 40 | 30 days supply | Qty: 30 | Fill #1

## 2017-08-07 ENCOUNTER — Ambulatory Visit: Payer: PPO | Admitting: Physical Therapy

## 2017-08-09 ENCOUNTER — Telehealth: Payer: Self-pay | Admitting: Pharmacist

## 2017-08-09 ENCOUNTER — Inpatient Hospital Stay: Payer: PPO | Attending: Internal Medicine

## 2017-08-09 ENCOUNTER — Inpatient Hospital Stay (HOSPITAL_BASED_OUTPATIENT_CLINIC_OR_DEPARTMENT_OTHER): Payer: PPO | Admitting: Internal Medicine

## 2017-08-09 VITALS — BP 117/73 | HR 79 | Temp 97.6°F | Resp 18

## 2017-08-09 DIAGNOSIS — E041 Nontoxic single thyroid nodule: Secondary | ICD-10-CM | POA: Diagnosis not present

## 2017-08-09 DIAGNOSIS — C642 Malignant neoplasm of left kidney, except renal pelvis: Secondary | ICD-10-CM

## 2017-08-09 DIAGNOSIS — Z807 Family history of other malignant neoplasms of lymphoid, hematopoietic and related tissues: Secondary | ICD-10-CM

## 2017-08-09 DIAGNOSIS — Z9221 Personal history of antineoplastic chemotherapy: Secondary | ICD-10-CM | POA: Diagnosis not present

## 2017-08-09 DIAGNOSIS — Z79899 Other long term (current) drug therapy: Secondary | ICD-10-CM | POA: Diagnosis not present

## 2017-08-09 DIAGNOSIS — Z87442 Personal history of urinary calculi: Secondary | ICD-10-CM | POA: Diagnosis not present

## 2017-08-09 DIAGNOSIS — Z7982 Long term (current) use of aspirin: Secondary | ICD-10-CM

## 2017-08-09 DIAGNOSIS — L271 Localized skin eruption due to drugs and medicaments taken internally: Secondary | ICD-10-CM | POA: Insufficient documentation

## 2017-08-09 DIAGNOSIS — R630 Anorexia: Secondary | ICD-10-CM

## 2017-08-09 DIAGNOSIS — C7800 Secondary malignant neoplasm of unspecified lung: Secondary | ICD-10-CM

## 2017-08-09 DIAGNOSIS — Z85820 Personal history of malignant melanoma of skin: Secondary | ICD-10-CM | POA: Diagnosis not present

## 2017-08-09 DIAGNOSIS — T451X5S Adverse effect of antineoplastic and immunosuppressive drugs, sequela: Secondary | ICD-10-CM | POA: Diagnosis not present

## 2017-08-09 DIAGNOSIS — R5383 Other fatigue: Secondary | ICD-10-CM | POA: Diagnosis not present

## 2017-08-09 DIAGNOSIS — J449 Chronic obstructive pulmonary disease, unspecified: Secondary | ICD-10-CM

## 2017-08-09 DIAGNOSIS — Z87891 Personal history of nicotine dependence: Secondary | ICD-10-CM | POA: Insufficient documentation

## 2017-08-09 DIAGNOSIS — I10 Essential (primary) hypertension: Secondary | ICD-10-CM | POA: Insufficient documentation

## 2017-08-09 DIAGNOSIS — R197 Diarrhea, unspecified: Secondary | ICD-10-CM | POA: Diagnosis not present

## 2017-08-09 DIAGNOSIS — R05 Cough: Secondary | ICD-10-CM | POA: Insufficient documentation

## 2017-08-09 DIAGNOSIS — I2510 Atherosclerotic heart disease of native coronary artery without angina pectoris: Secondary | ICD-10-CM | POA: Diagnosis not present

## 2017-08-09 LAB — CBC WITH DIFFERENTIAL/PLATELET
BASOS ABS: 0 10*3/uL (ref 0–0.1)
BASOS PCT: 1 %
EOS ABS: 0.3 10*3/uL (ref 0–0.7)
Eosinophils Relative: 4 %
HCT: 40.3 % (ref 40.0–52.0)
HEMOGLOBIN: 14.4 g/dL (ref 13.0–18.0)
LYMPHS ABS: 1.2 10*3/uL (ref 1.0–3.6)
Lymphocytes Relative: 19 %
MCH: 33.8 pg (ref 26.0–34.0)
MCHC: 35.8 g/dL (ref 32.0–36.0)
MCV: 94.6 fL (ref 80.0–100.0)
Monocytes Absolute: 0.5 10*3/uL (ref 0.2–1.0)
Monocytes Relative: 7 %
NEUTROS PCT: 69 %
Neutro Abs: 4.2 10*3/uL (ref 1.4–6.5)
Platelets: 157 10*3/uL (ref 150–440)
RBC: 4.26 MIL/uL — AB (ref 4.40–5.90)
RDW: 17.3 % — ABNORMAL HIGH (ref 11.5–14.5)
WBC: 6.2 10*3/uL (ref 3.8–10.6)

## 2017-08-09 LAB — COMPREHENSIVE METABOLIC PANEL
ALBUMIN: 3.7 g/dL (ref 3.5–5.0)
ALK PHOS: 88 U/L (ref 38–126)
ALT: 33 U/L (ref 17–63)
AST: 31 U/L (ref 15–41)
Anion gap: 7 (ref 5–15)
BUN: 10 mg/dL (ref 6–20)
CALCIUM: 9.3 mg/dL (ref 8.9–10.3)
CHLORIDE: 100 mmol/L — AB (ref 101–111)
CO2: 29 mmol/L (ref 22–32)
CREATININE: 1.32 mg/dL — AB (ref 0.61–1.24)
GFR calc Af Amer: 59 mL/min — ABNORMAL LOW (ref >60)
GFR calc non Af Amer: 51 mL/min — ABNORMAL LOW (ref >60)
Glucose, Bld: 95 mg/dL (ref 65–99)
Potassium: 3.6 mmol/L (ref 3.5–5.1)
SODIUM: 136 mmol/L (ref 135–145)
Total Bilirubin: 0.8 mg/dL (ref 0.3–1.2)
Total Protein: 6.8 g/dL (ref 6.5–8.1)

## 2017-08-09 LAB — LACTATE DEHYDROGENASE: LDH: 254 U/L — ABNORMAL HIGH (ref 98–192)

## 2017-08-09 MED ORDER — CABOZANTINIB S-MALATE 20 MG PO TABS: 20.0000 mg | ORAL_TABLET | Freq: Every day | ORAL | 3 refills | Status: DC

## 2017-08-09 MED ORDER — HYDROCOD POLST-CPM POLST ER 10-8 MG/5ML PO SUER: 5.0000 mL | Freq: Every evening | ORAL | 0 refills | Status: DC | PRN

## 2017-08-09 NOTE — Telephone Encounter (Signed)
Oral Oncology Pharmacist Encounter Received dose reduction prescription for cabozantinib 20mg  for the treatment of metastatic clear cell renal cancer in planned duration until disease progression or unacceptable drug toxicity. Patient was seen in clinic today 08/09/17 and the plan is for the patient to hold his medication for 2 weeks then restart on 08/23/17.  Reviewed CMP/CBC/BP from today, no relevant lab abnormalities.   Current medication list in Epic reviewed, no DDIs with cabozantinib.   Prescription has been e-scribed to the [facility].  Oral Oncology Clinic will f/u patients restart of medication in 2 weeks. Attempted to contact patient to see if he would like medication mailed next week or the week after, no answer LVM.   Darl Pikes, PharmD, BCPS Hematology/Oncology Clinical Pharmacist [facility] [phone]

## 2017-08-09 NOTE — Progress Notes (Signed)
[facility] OFFICE PROGRESS NOTE  Patient Care Team: Dion Body, MD as PCP - General (Family Medicine) Jannet Mantis, MD (Dermatology) Bary Castilla Forest Gleason, MD (General Surgery) Hollice Espy, MD as Consulting Physician (Urology)  No matching staging information was found for the patient.   Oncology History   # MAY- June 2017- METASTATIC CLEAR CELL; LEFT RENAL CA/STAGE IV; [place]- G-3; INTERMEDIATE RISK;  [bil Pul lung nodules;incidental s/p Bx Dr.Byrnett/Dr.Oaks] s/p Cytoreductive nephrectomy; Dr.Brandon- [id number]; July 11th 2017 CT- Enlarging Lung nodules  # Aug 7th 2017- PAZOPANIB; STOP SEP 2017 [pancreatitis/poor tol]  # OCT 1 week- START NIVO q 2W x4; DEC 8th CT- "Progression"; Continue Opdivo;APRIL-MAY 2018- STABLE LUNG NODULES [stopped sec to severe cough; NO Pneumonitis]  # May 31st 2018- [place] 60mg /day; July 31st CT lung- improved lung nodules; Sep 1st week- cabo- 40mg /day [dose reduced sec to mult intol]; Oct 1st 2week-Cabo 20mg /day  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   At the last visit approximately 3 weeks ago patient dose of cabo was reduced to 40 mg a day.   However patient continues to complain of pain in his palms and soles; especially his feet after a long day with exertion. Also complains of continued fatigue. He unfortunately continues to have cough. Not improved. He is on multiple inhalers and also uses nebulizers. He recently finishes prednisone.   REVIEW OF SYSTEMS:  A complete 10 point review of system is done which is negative except mentioned above/history  of present illness.   PAST MEDICAL HISTORY :  Past Medical History:  Diagnosis Date  . CAD (coronary artery disease)   . Cancer ([place])    left arm  . COPD (chronic obstructive pulmonary disease) ([place])   . Hypertension   . Kidney stone   . Lung cancer ([place])   . Melanoma ([place]) 01/24/2016   right shoulder  . Thyroid nodule 02/02/2016   left BENIGN THYROID NODULE by FNA    PAST SURGICAL HISTORY :   Past Surgical History:  Procedure Laterality Date  . arm surgery Left    arm  . cardiac stents  2011   Angioplasty / Stenting Femoral-X2  . COLONOSCOPY  04/01/12  . CORONARY ANGIOPLASTY    . EXCISION MELANOMA WITH SENTINEL LYMPH NODE BIOPSY Right 01/24/2016   Procedure: EXCISION MELANOMA Right Shoulder;  Surgeon: Robert Bellow, MD;  Location: [facility] ORS;  Service: General;  Laterality: Right;  . LAPAROSCOPIC NEPHRECTOMY, HAND ASSISTED Left 04/24/2016   Procedure: HAND ASSISTED LAPAROSCOPIC NEPHRECTOMY;  Surgeon: Hollice Espy, MD;  Location: [facility] ORS;  Service: Urology;  Laterality: Left;  Marland Kitchen VIDEO ASSISTED THORACOSCOPY (VATS)/THOROCOTOMY Left 03/08/2016   Procedure: VIDEO ASSISTED THORACOSCOPY (VATS) with lung biopsy - Left ;  Surgeon: Robert Bellow, MD;  Location: [facility] ORS;  Service: General;  Laterality: Left;    FAMILY HISTORY :   Family History  Problem Relation Age of Onset  . Hodgkin's lymphoma Daughter 5  . Kidney cancer Neg Hx   . Kidney disease Neg Hx   . Prostate cancer Neg Hx     SOCIAL  HISTORY:   Social History  Substance Use Topics  . Smoking status: Former Smoker    Types: Cigarettes    Start date: 01/18/1956    Quit date: 01/17/1969  . Smokeless tobacco: Never Used  . Alcohol use No     Comment: BEER OCC    ALLERGIES:  is allergic to lisinopril and other.  MEDICATIONS:  Current Outpatient Prescriptions  Medication Sig Dispense Refill  . ADVAIR DISKUS 500-50 MCG/DOSE AEPB INHALE 1 PUFF INTO THE LUNGS 2 (TWO) TIMES DAILY. 60 each 3  . albuterol (PROVENTIL  HFA;VENTOLIN HFA) 108 (90 Base) MCG/ACT inhaler Inhale 2 puffs into the lungs every 6 (six) hours as needed for wheezing or shortness of breath. 1 Inhaler 2  . amLODipine (NORVASC) 5 MG tablet Take 1 tablet (5 mg total) by mouth 2 (two) times daily. 60 tablet 0  . aspirin EC 81 MG tablet Take 81 mg by mouth daily.    Marland Kitchen atorvastatin (LIPITOR) 40 MG tablet Take 40 mg by mouth daily.    . clopidogrel (PLAVIX) 75 MG tablet Take 75 mg by mouth daily.    Marland Kitchen docusate sodium (COLACE) 100 MG capsule Take 1 capsule (100 mg total) by mouth 2 (two) times daily. 60 capsule 3  . DULoxetine (CYMBALTA) 30 MG capsule Take 2 capsules by mouth daily.    Marland Kitchen FLUZONE HIGH-DOSE 0.5 ML injection     . ipratropium-albuterol (DUONEB) 0.5-2.5 (3) MG/3ML SOLN Take 3 mLs by nebulization every 4 (four) hours as needed. 360 mL 3  . loratadine (CLARITIN) 10 MG tablet Take 10 mg by mouth daily.    Marland Kitchen losartan (COZAAR) 50 MG tablet Take 50 mg by mouth every morning.     . magic mouthwash w/lidocaine SOLN Take 5 mLs by mouth 4 (four) times daily. 480 mL 3  . MELATONIN PO Take 10 mg by mouth at bedtime.     . metoprolol succinate (TOPROL-XL) 50 MG 24 hr tablet Take 50 mg by mouth every morning.     . OXYGEN Inhale 3 L into the lungs continuous.    . tamsulosin (FLOMAX) 0.4 MG CAPS capsule Take 0.4 mg by mouth every morning.     . Tiotropium Bromide Monohydrate (SPIRIVA RESPIMAT) 1.25 MCG/ACT AERS Inhale 1.25 Act into the lungs 2 (two) times daily. 1 Inhaler 12  . acetaminophen (TYLENOL) 500 MG tablet Take 500 mg by mouth every 6 (six) hours as needed for moderate pain.    . cabozantinib S-Malate (CABOMETYX) 20 MG TABS Take 20 mg by mouth daily. Take on an empty stomach. 30 tablet 3  . chlorpheniramine-HYDROcodone (TUSSIONEX) 10-8 MG/5ML SUER Take 5 mLs by mouth at bedtime as needed for cough. 140 mL 0  . nitroGLYCERIN (NITROSTAT) 0.4 MG SL tablet Place 0.4 mg under the tongue every 5 (five) minutes as needed.      No current  facility-administered medications for this visit.     PHYSICAL EXAMINATION: ECOG PERFORMANCE STATUS: 0 - Asymptomatic  BP 117/73   Pulse 79   Temp 97.6 F (36.4 C) (Tympanic)   Resp 18   There were no vitals filed for this visit.  GENERAL: Well-nourished well-developed; Alert, no distress and comfortable. Accompanied by his wife.  EYES: no pallor or icterus OROPHARYNX: no thrush or ulceration; NECK: supple, no masses felt LYMPH:  no palpable lymphadenopathy in the cervical, axillary or inguinal regions LUNGS: clear to auscultation and  No wheeze or crackles HEART/CVS: regular rate & rhythm and no murmurs; No lower  extremity edema ABDOMEN:abdomen soft, non-tender and normal bowel sounds Musculoskeletal:no cyanosis of digits and no clubbing  PSYCH: alert & oriented x 3 with fluent speech NEURO: no focal motor/sensory deficits SKIN:  Skin rash resolved.  LABORATORY DATA:  I have reviewed the data as listed    Component Value Date/Time   NA 136 08/09/2017 1021   NA 146 (H) 05/25/2016 1514   K 3.6 08/09/2017 1021   CL 100 (L) 08/09/2017 1021   CO2 29 08/09/2017 1021   GLUCOSE 95 08/09/2017 1021   BUN 10 08/09/2017 1021   BUN 19 05/25/2016 1514   CREATININE 1.32 (H) 08/09/2017 1021   CALCIUM 9.3 08/09/2017 1021   PROT 6.8 08/09/2017 1021   ALBUMIN 3.7 08/09/2017 1021   AST 31 08/09/2017 1021   ALT 33 08/09/2017 1021   ALKPHOS 88 08/09/2017 1021   BILITOT 0.8 08/09/2017 1021   GFRNONAA 51 (L) 08/09/2017 1021   GFRAA 59 (L) 08/09/2017 1021    No results found for: SPEP, UPEP  Lab Results  Component Value Date   WBC 6.2 08/09/2017   NEUTROABS 4.2 08/09/2017   HGB 14.4 08/09/2017   HCT 40.3 08/09/2017   MCV 94.6 08/09/2017   PLT 157 08/09/2017      Chemistry      Component Value Date/Time   NA 136 08/09/2017 1021   NA 146 (H) 05/25/2016 1514   K 3.6 08/09/2017 1021   CL 100 (L) 08/09/2017 1021   CO2 29 08/09/2017 1021   BUN 10 08/09/2017 1021   BUN 19  05/25/2016 1514   CREATININE 1.32 (H) 08/09/2017 1021      Component Value Date/Time   CALCIUM 9.3 08/09/2017 1021   ALKPHOS 88 08/09/2017 1021   AST 31 08/09/2017 1021   ALT 33 08/09/2017 1021   BILITOT 0.8 08/09/2017 1021     RADIOGRAPHIC STUDIES: I have personally reviewed the radiological images as listed and agreed with the findings in the report. No results found.  IMPRESSION: No evidence of pulmonary embolism.  Widespread pulmonary metastases, improved.  Emphysema (ICD10-J43.9).   Electronically Signed   By: Julian Hy M.D.   On: 06/18/2017 17:52 ASSESSMENT & PLAN:  Cancer of left kidney excluding renal pelvis (HCC) # Left kidney cancer metastatic to the lung- currently on [facility] [since May 30th]. No clinical evidence of progression. CT scan lung- July 31st- improvement of the bilateral lung lesions. However patient tolerating even cabo 40 mg/day poorly with multiple side effects [C discussion below].  # Would recommend cutting down the dose of cabo to 20 mg a day. New prescription given.  # Patient had questions of continued treatment/especially given his poor tolerance; quality of life issues- especially in the context of his incurable disease. Discussed that he has multiple other treatment options available although unfortunately seems to be tolerating treatments poorly at this time. Recommend a palliative care evaluation at this time.  # Hand-foot syndrome grade 2-3-improved; but NOT completely resolved- continue Vaseline/udder cream/ kenalog ointment. Low-dose of cabo at 20 mg/day after resolution of symptoms.  # Diarrhea- grade 1-2- secondary to cabo- resolved. Recommend starting cabo low-dose.  # Chronic cough clear etiology. Status post multiple inhalers; pulmonary evaluation. Recommend Tussionex new prescription given.   # elevated BP-blood pressure today better control.  # Severe fatigue/poor appetite- slight improvement on prednisone; discontinue  further prednisone.  # Follow-up with me 4 weeks/labs; start cabo 20mg /day [new prescription given.] in 2 weeks; labs. Palliative care referral.  Orders Placed  This Encounter  Procedures  . CBC with Differential    Standing Status:   Future    Standing Expiration Date:   08/09/2018  . Comprehensive metabolic panel    Standing Status:   Future    Standing Expiration Date:   08/09/2018      Cammie Sickle, MD 08/12/2017 10:15 PM

## 2017-08-09 NOTE — Progress Notes (Signed)
Pt reports the following symptoms:  A decreased perception in taste/smell, dry cough, "blisters on the soles" of his feet bilaterally and fatigue.  Patient currently on cabometyx 40 mg- would like to discuss dose reduction of oral chemotherapy. Patient/pt's wife also states he would like to explore palliative care for symptoms mgmt.

## 2017-08-09 NOTE — Assessment & Plan Note (Addendum)
#   Left kidney cancer metastatic to the lung- currently on [facility] [since May 30th]. No clinical evidence of progression. CT scan lung- July 31st- improvement of the bilateral lung lesions. However patient tolerating even cabo 40 mg/day poorly with multiple side effects [C discussion below].  # Would recommend cutting down the dose of cabo to 20 mg a day. New prescription given.  # Patient had questions of continued treatment/especially given his poor tolerance; quality of life issues- especially in the context of his incurable disease. Discussed that he has multiple other treatment options available although unfortunately seems to be tolerating treatments poorly at this time. Recommend a palliative care evaluation at this time.  # Hand-foot syndrome grade 2-3-improved; but NOT completely resolved- continue Vaseline/udder cream/ kenalog ointment. Low-dose of cabo at 20 mg/day after resolution of symptoms.  # Diarrhea- grade 1-2- secondary to cabo- resolved. Recommend starting cabo low-dose.  # Chronic cough clear etiology. Status post multiple inhalers; pulmonary evaluation. Recommend Tussionex new prescription given.   # elevated BP-blood pressure today better control.  # Severe fatigue/poor appetite- slight improvement on prednisone; discontinue further prednisone.  # Follow-up with me 4 weeks/labs; start cabo 20mg /day [new prescription given.] in 2 weeks; labs. Palliative care referral.

## 2017-08-12 ENCOUNTER — Ambulatory Visit: Payer: PPO | Admitting: Physical Therapy

## 2017-08-12 ENCOUNTER — Other Ambulatory Visit: Payer: Self-pay | Admitting: *Deleted

## 2017-08-12 NOTE — Telephone Encounter (Signed)
Per VO Dr Rogue Bussing, do NOT refill

## 2017-08-12 NOTE — Telephone Encounter (Signed)
Received fax from pharmacy for Prednisone refill. This is no longer on his medicine list. Please advise

## 2017-08-13 ENCOUNTER — Other Ambulatory Visit: Payer: Self-pay | Admitting: *Deleted

## 2017-08-13 DIAGNOSIS — C642 Malignant neoplasm of left kidney, except renal pelvis: Secondary | ICD-10-CM

## 2017-08-13 NOTE — Telephone Encounter (Signed)
Oral Oncology Patient Advocate Encounter   L/M to see if we can mail out new lower dose Cabometyx.   [place] Patient Advocate [phone] 08/13/2017 1:56 PM

## 2017-08-14 ENCOUNTER — Ambulatory Visit: Payer: PPO | Admitting: Physical Therapy

## 2017-08-14 MED FILL — CABOMETYX 20 MG TABLET: 20 | 30 days supply | Qty: 30 | Fill #0

## 2017-08-14 NOTE — Telephone Encounter (Signed)
Oral Chemotherapy Pharmacist Encounter  Spoke with patient over the phone today. He would like [place] to ship out medication. Patient should have medication in hand by Saturday 9/29.   Patient knows not to start his medication until the end of his 2 week Cabometyx break. Patient will restart medication on 08/23/17.  Darl Pikes, PharmD, BCPS Hematology/Oncology Clinical Pharmacist [facility] [phone]  08/14/2017 5:13 PM

## 2017-08-19 DIAGNOSIS — J449 Chronic obstructive pulmonary disease, unspecified: Secondary | ICD-10-CM | POA: Diagnosis not present

## 2017-08-19 DIAGNOSIS — C642 Malignant neoplasm of left kidney, except renal pelvis: Secondary | ICD-10-CM | POA: Diagnosis not present

## 2017-08-19 DIAGNOSIS — R05 Cough: Secondary | ICD-10-CM | POA: Diagnosis not present

## 2017-08-19 DIAGNOSIS — R0602 Shortness of breath: Secondary | ICD-10-CM | POA: Diagnosis not present

## 2017-08-19 DIAGNOSIS — Z515 Encounter for palliative care: Secondary | ICD-10-CM | POA: Diagnosis not present

## 2017-08-26 ENCOUNTER — Telehealth: Payer: Self-pay | Admitting: *Deleted

## 2017-08-26 NOTE — Telephone Encounter (Signed)
Patient's wife contacted RN at [facility]. wife voiced concerns that pt had developed sores on the bottom of his feet this past Saturday. Restarted medication on 08/23/17 as directed by provider. Pt's wife stated that pt seemed confused this weekend. She wanted Dr. B to discuss hospice at next apt. She states that palliative care visited the patient recently and the option for hospice was discussed with patient.  I spoke with Dr. Rogue Bussing- md stated that pt needs to d/c the cabometyx.  I personally reached back out to the patient to evaluate his concerns. He states that he used udder cream on his feet this weekend, and the sores have completely disappeared. He feels better other than his unproductive cough.   MD made aware. Per md- keep patient on caometyx for now. Pt made aware.

## 2017-09-02 ENCOUNTER — Ambulatory Visit
Admission: RE | Admit: 2017-09-02 | Discharge: 2017-09-02 | Disposition: A | Discharge status: Home / Self Care | Payer: PPO | Source: Ambulatory Visit | Attending: Oncology | Admitting: Oncology

## 2017-09-02 ENCOUNTER — Telehealth: Payer: Self-pay | Admitting: *Deleted

## 2017-09-02 ENCOUNTER — Inpatient Hospital Stay: Payer: PPO | Attending: Oncology | Admitting: Oncology

## 2017-09-02 VITALS — BP 147/93 | HR 82 | Temp 97.8°F | Resp 20

## 2017-09-02 DIAGNOSIS — I10 Essential (primary) hypertension: Secondary | ICD-10-CM

## 2017-09-02 DIAGNOSIS — C7800 Secondary malignant neoplasm of unspecified lung: Secondary | ICD-10-CM | POA: Diagnosis not present

## 2017-09-02 DIAGNOSIS — Z806 Family history of leukemia: Secondary | ICD-10-CM | POA: Diagnosis not present

## 2017-09-02 DIAGNOSIS — E041 Nontoxic single thyroid nodule: Secondary | ICD-10-CM

## 2017-09-02 DIAGNOSIS — Z79899 Other long term (current) drug therapy: Secondary | ICD-10-CM | POA: Diagnosis not present

## 2017-09-02 DIAGNOSIS — J449 Chronic obstructive pulmonary disease, unspecified: Secondary | ICD-10-CM | POA: Diagnosis not present

## 2017-09-02 DIAGNOSIS — Z87442 Personal history of urinary calculi: Secondary | ICD-10-CM

## 2017-09-02 DIAGNOSIS — M25562 Pain in left knee: Secondary | ICD-10-CM | POA: Diagnosis not present

## 2017-09-02 DIAGNOSIS — C642 Malignant neoplasm of left kidney, except renal pelvis: Secondary | ICD-10-CM | POA: Diagnosis not present

## 2017-09-02 DIAGNOSIS — S8992XA Unspecified injury of left lower leg, initial encounter: Secondary | ICD-10-CM

## 2017-09-02 DIAGNOSIS — X58XXXA Exposure to other specified factors, initial encounter: Secondary | ICD-10-CM | POA: Diagnosis not present

## 2017-09-02 DIAGNOSIS — Z87891 Personal history of nicotine dependence: Secondary | ICD-10-CM

## 2017-09-02 DIAGNOSIS — I2510 Atherosclerotic heart disease of native coronary artery without angina pectoris: Secondary | ICD-10-CM | POA: Diagnosis not present

## 2017-09-02 DIAGNOSIS — M7989 Other specified soft tissue disorders: Secondary | ICD-10-CM | POA: Diagnosis not present

## 2017-09-02 DIAGNOSIS — Z7982 Long term (current) use of aspirin: Secondary | ICD-10-CM

## 2017-09-02 DIAGNOSIS — Z9221 Personal history of antineoplastic chemotherapy: Secondary | ICD-10-CM

## 2017-09-02 DIAGNOSIS — Z85820 Personal history of malignant melanoma of skin: Secondary | ICD-10-CM

## 2017-09-02 NOTE — Telephone Encounter (Signed)
Wife called-spoke with RN. pts acute left knee is swollen and movement.  Also reports joint stiffness in right shoulder. Patient took tylenol 500 mg tablet at 4 am. Tylenol does not relieve pt's symptoms.  Spoke with Dr. Vivien Presto would like NP to evaluate patient today. Patient given an apt today at 130pm to see Sonia Baller, NP.

## 2017-09-02 NOTE — Progress Notes (Signed)
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  Chief complaint/ Reason for visit- Left Knee pain  Heme/Onc history:  MAY- June 2017- METASTATIC CLEAR CELL; LEFT RENAL CA/STAGE IV; [place]- G-3; INTERMEDIATE RISK;  [bil Pul lung nodules;incidental s/p Bx Dr.Byrnett/Dr.Oaks] s/p Cytoreductive nephrectomy; Dr.Brandon- [id number]; July 11th 2017 CT- Enlarging Lung nodules  # Aug 7th 2017- PAZOPANIB; STOP SEP 2017 [pancreatitis/poor tol]  # OCT 1 week- START NIVO q 2W x4; DEC 8th CT- "Progression"; Continue Opdivo;APRIL-MAY 2018- STABLE LUNG NODULES [stopped sec to severe cough; NO Pneumonitis]  # May 31st 2018- Cabo 60mg /day; July 31st CT lung- improved lung nodules; Sep 1st week- cabo- 40mg /day [dose reduced sec to mult intol]; Oct 1st 2week-Cabo 20mg /day  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 Rogue Bussing on 08/09/2017 where he continued to complain of pain in his palms and soles, continued fatigue, poor appetite and chronic cough that is not improved with inhalers, nebulizer and recent prednisone. He currently is on Cabometyx and takes 20 mg tablets daily which was recently dose reduced due to continued palms and soles pain and poor tolerance and quality of life. A referral was made to palliative care. He was to be seen back in a few weeks to assess the tolerance of the new dose of Cabometyx.   Today, he presents for left  knee pain that began 2-3 weeks ago. He states he twisted his left knee while walking and it has been swollen and painful ever since. He states that he has been rotating Tylenol and Aleve as often as he can without relief. His wife states that sometimes he needs help getting out of a sitting position because of the pain. He rates the pain an 8 out of 10 when applying pressure to the left knee. He applied a knee brace yesterday and that seems to be relieving some of his pain. When resting and elevating the leg he denies pain. Otherwise he patient feels great. The pain in the palms and the soles is much better. His fatigue is better and he admits to eating more. He denies any shortness of breath at this time. His wife has questions about follow-up CT scan.  ECOG FS:0 - Asymptomatic  Review of systems- Review of Systems  Constitutional: Negative for chills, fever, malaise/fatigue and weight loss.  HENT: Negative.   Eyes: Negative.   Respiratory: Negative.   Cardiovascular: Negative.  Negative for chest pain and leg swelling.  Gastrointestinal: Negative.  Negative for abdominal pain, diarrhea, nausea and vomiting.  Genitourinary: Negative.   Musculoskeletal: Positive for joint pain. Negative for falls.  Skin: Negative.   Neurological: Negative.  Negative for weakness.  Endo/Heme/Allergies: Negative.   Psychiatric/Behavioral: Negative.      Current treatment- Recently dose reduced from 40 mg Cabometyx to 20 mg Cabometyx daily.   Allergies  Allergen Reactions  . Lisinopril Swelling  . Other Itching    Nitroglycerin Patch.     Past Medical History:  Diagnosis Date  .  CAD (coronary artery disease)   . Cancer ([place])    left arm  . COPD (chronic obstructive pulmonary disease) ([place])   . Hypertension   . Kidney stone   . Lung cancer ([place])   . Melanoma ([place]) 01/24/2016   right shoulder  . Thyroid nodule 02/02/2016   left BENIGN THYROID NODULE by FNA     Past Surgical History:    Procedure Laterality Date  . arm surgery Left    arm  . cardiac stents  2011   Angioplasty / Stenting Femoral-X2  . COLONOSCOPY  04/01/12  . CORONARY ANGIOPLASTY    . EXCISION MELANOMA WITH SENTINEL LYMPH NODE BIOPSY Right 01/24/2016   Procedure: EXCISION MELANOMA Right Shoulder;  Surgeon: Robert Bellow, MD;  Location: [facility] ORS;  Service: General;  Laterality: Right;  . LAPAROSCOPIC NEPHRECTOMY, HAND ASSISTED Left 04/24/2016   Procedure: HAND ASSISTED LAPAROSCOPIC NEPHRECTOMY;  Surgeon: Hollice Espy, MD;  Location: [facility] ORS;  Service: Urology;  Laterality: Left;  Marland Kitchen VIDEO ASSISTED THORACOSCOPY (VATS)/THOROCOTOMY Left 03/08/2016   Procedure: VIDEO ASSISTED THORACOSCOPY (VATS) with lung biopsy - Left ;  Surgeon: Robert Bellow, MD;  Location: [facility] ORS;  Service: General;  Laterality: Left;    Social History   Social History  . Marital status: Married    Spouse name: N/A  . Number of children: N/A  . Years of education: N/A   Occupational History  . Not on file.   Social History Main Topics  . Smoking status: Former Smoker    Types: Cigarettes    Start date: 01/18/1956    Quit date: 01/17/1969  . Smokeless tobacco: Never Used  . Alcohol use No     Comment: BEER OCC  . Drug use: No  . Sexual activity: Not on file   Other Topics Concern  . Not on file   Social History Narrative  . No narrative on file    Family History  Problem Relation Age of Onset  . Hodgkin's lymphoma Daughter 5  . Kidney cancer Neg Hx   . Kidney disease Neg Hx   . Prostate cancer Neg Hx      Current Outpatient Prescriptions:  .  acetaminophen (TYLENOL) 500 MG tablet, Take 500 mg by mouth every 6 (six) hours as needed for moderate pain., Disp: , Rfl:  .  ADVAIR DISKUS 500-50 MCG/DOSE AEPB, INHALE 1 PUFF INTO THE LUNGS 2 (TWO) TIMES DAILY., Disp: 60 each, Rfl: 3 .  albuterol (PROVENTIL HFA;VENTOLIN HFA) 108 (90 Base) MCG/ACT inhaler, Inhale 2 puffs into the lungs every 6 (six) hours as needed for  wheezing or shortness of breath., Disp: 1 Inhaler, Rfl: 2 .  amLODipine (NORVASC) 5 MG tablet, Take 1 tablet (5 mg total) by mouth 2 (two) times daily., Disp: 60 tablet, Rfl: 0 .  aspirin EC 81 MG tablet, Take 81 mg by mouth daily., Disp: , Rfl:  .  atorvastatin (LIPITOR) 40 MG tablet, Take 40 mg by mouth daily., Disp: , Rfl:  .  cabozantinib S-Malate (CABOMETYX) 20 MG TABS, Take 20 mg by mouth daily. Take on an empty stomach., Disp: 30 tablet, Rfl: 3 .  chlorpheniramine-HYDROcodone (TUSSIONEX) 10-8 MG/5ML SUER, Take 5 mLs by mouth at bedtime as needed for cough., Disp: 140 mL, Rfl: 0 .  clopidogrel (PLAVIX) 75 MG tablet, Take 75 mg by mouth daily., Disp: , Rfl:  .  docusate sodium (COLACE) 100 MG capsule, Take 1 capsule (100 mg total) by mouth 2 (two) times daily., Disp: 60 capsule,  Rfl: 3 .  DULoxetine (CYMBALTA) 30 MG capsule, Take 2 capsules by mouth daily., Disp: , Rfl:  .  FLUZONE HIGH-DOSE 0.5 ML injection, , Disp: , Rfl:  .  ipratropium-albuterol (DUONEB) 0.5-2.5 (3) MG/3ML SOLN, Take 3 mLs by nebulization every 4 (four) hours as needed., Disp: 360 mL, Rfl: 3 .  loratadine (CLARITIN) 10 MG tablet, Take 10 mg by mouth daily., Disp: , Rfl:  .  losartan (COZAAR) 50 MG tablet, Take 50 mg by mouth every morning. , Disp: , Rfl:  .  magic mouthwash w/lidocaine SOLN, Take 5 mLs by mouth 4 (four) times daily., Disp: 480 mL, Rfl: 3 .  MELATONIN PO, Take 10 mg by mouth at bedtime. , Disp: , Rfl:  .  metoprolol succinate (TOPROL-XL) 50 MG 24 hr tablet, Take 50 mg by mouth every morning. , Disp: , Rfl:  .  nitroGLYCERIN (NITROSTAT) 0.4 MG SL tablet, Place 0.4 mg under the tongue every 5 (five) minutes as needed. , Disp: , Rfl:  .  OXYGEN, Inhale 3 L into the lungs continuous. bedtime, Disp: , Rfl:  .  tamsulosin (FLOMAX) 0.4 MG CAPS capsule, Take 0.4 mg by mouth every morning. , Disp: , Rfl:  .  Tiotropium Bromide Monohydrate (SPIRIVA RESPIMAT) 1.25 MCG/ACT AERS, Inhale 1.25 Act into the lungs 2 (two)  times daily., Disp: 1 Inhaler, Rfl: 12  Physical exam:  Vitals:   09/02/17 1345  BP: (!) 147/93  Pulse: 82  Resp: 20  Temp: 97.8 F (36.6 C)  TempSrc: Tympanic   Physical Exam  Constitutional: He is oriented to person, place, and time and well-developed, well-nourished, and in no distress.  HENT:  Head: Normocephalic and atraumatic.  Eyes: Pupils are equal, round, and reactive to light. Conjunctivae and EOM are normal.  Neck: Neck supple.  Cardiovascular: Normal rate, regular rhythm and normal heart sounds.   Pulmonary/Chest: Effort normal and breath sounds normal.  Abdominal: Soft. Bowel sounds are normal.  Musculoskeletal: He exhibits edema and tenderness.       Left knee: He exhibits decreased range of motion and swelling. Tenderness found.  Neurological: He is alert and oriented to person, place, and time.  Skin: Skin is warm and dry.  Psychiatric: Mood, memory, affect and judgment normal.     CMP Latest Ref Rng & Units 08/09/2017  Glucose 65 - 99 mg/dL 95  BUN 6 - 20 mg/dL 10  Creatinine 0.61 - 1.24 mg/dL 1.32(H)  Sodium 135 - 145 mmol/L 136  Potassium 3.5 - 5.1 mmol/L 3.6  Chloride 101 - 111 mmol/L 100(L)  CO2 22 - 32 mmol/L 29  Calcium 8.9 - 10.3 mg/dL 9.3  Total Protein 6.5 - 8.1 g/dL 6.8  Total Bilirubin 0.3 - 1.2 mg/dL 0.8  Alkaline Phos 38 - 126 U/L 88  AST 15 - 41 U/L 31  ALT 17 - 63 U/L 33   CBC Latest Ref Rng & Units 08/09/2017  WBC 3.8 - 10.6 K/uL 6.2  Hemoglobin 13.0 - 18.0 g/dL 14.4  Hematocrit 40.0 - 52.0 % 40.3  Platelets 150 - 440 K/uL 157    No images are attached to the encounter.  Dg Knee 1-2 Views Left  Result Date: 09/02/2017 CLINICAL DATA:  Left knee pain and swelling for 2 weeks, patients states that it was a twisting injury, no fall, has been wearing a brace since the time of the incident with some relief, best possible images obtained EXAM: LEFT KNEE - 1-2 VIEW COMPARISON:  None. FINDINGS: Moderate  patellofemoral and medial compartment  joint space narrowing with subchondral sclerosis. Small intra-articular loose bodies. Lateral compartment joint space maintained. No acute fracture or dislocation. No joint effusion. Possible lucent lesion within the proximal tibia anteriorly on the lateral view. IMPRESSION: Degenerative change, without acute osseous finding. Suspect a lucent lesion within the proximal tibia. This could represent a fibrous cortical defect. Consider dedicated tibia radiographs. Electronically Signed   By: Abigail Miyamoto M.D.   On: 09/02/2017 16:56     Assessment and plan- Patient is a 76 y.o. male he presents for left knee swelling and pain. The left knee is visibly swollen. There is no erythema or obvious deformity.There is no bruising. He is wearing a brace.   1. Left knee pain and swelling: DG left knee 2-view. Recommended patient continue to wear knee brace, ice the knee several times per day and keep elevated. Additionally keep rotating Tylenol and Aleve as needed for pain. Results: Degenerative change, without acute osseous finding. Suspect a lucent lesion within the proximal tibia. This could represent a fibrous cortical defect. Consider dedicated tibia radiographs. Will consult with Dr. Rogue Bussing.  2. 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 RTC after scan to see Dr. Rogue Bussing.    Visit Diagnosis 1. Left knee injury, initial encounter   2. Primary malignant neoplasm of left kidney with metastasis from kidney to other site [facility])     Patient expressed understanding and was in agreement with this plan. He also understands that He can call clinic at any time with any questions, concerns, or complaints.    Marisue Humble [facility] Pager- [phone] 09/04/2017 8:16 AM

## 2017-09-05 ENCOUNTER — Other Ambulatory Visit: Payer: Self-pay | Admitting: Oncology

## 2017-09-05 ENCOUNTER — Other Ambulatory Visit: Payer: Self-pay | Admitting: *Deleted

## 2017-09-05 ENCOUNTER — Telehealth: Payer: Self-pay | Admitting: Oncology

## 2017-09-05 DIAGNOSIS — M25562 Pain in left knee: Secondary | ICD-10-CM

## 2017-09-05 NOTE — Telephone Encounter (Signed)
Informed by Hassan Rowan (Triage RN) that Glen Davis wife called and stated that his knee was unfortunately not getting any better. It has progressively become more painful and swollen and he is barely able to put any weight on the knee despite wearing the brace, maintaining elevation and rotation of tylenol/aleve for pain control. Informed patient of results of left knee scan. Placed order for dedicated tibia x-ray. Also place a referral for orthopedic for next week pending results of the scan of his tibia. Patient's wife and patient is agreeable to the plan. They're planning to get his scan done tomorrow.

## 2017-09-06 ENCOUNTER — Ambulatory Visit
Admission: RE | Admit: 2017-09-06 | Discharge: 2017-09-06 | Disposition: A | Discharge status: Home / Self Care | Payer: PPO | Source: Ambulatory Visit | Attending: Oncology | Admitting: Oncology

## 2017-09-06 ENCOUNTER — Ambulatory Visit: Payer: PPO | Admitting: Internal Medicine

## 2017-09-06 ENCOUNTER — Other Ambulatory Visit: Payer: Self-pay | Admitting: Oncology

## 2017-09-06 ENCOUNTER — Other Ambulatory Visit: Payer: PPO

## 2017-09-06 ENCOUNTER — Telehealth: Payer: Self-pay | Admitting: Oncology

## 2017-09-06 DIAGNOSIS — M25562 Pain in left knee: Secondary | ICD-10-CM | POA: Insufficient documentation

## 2017-09-06 DIAGNOSIS — S8992XA Unspecified injury of left lower leg, initial encounter: Secondary | ICD-10-CM | POA: Diagnosis not present

## 2017-09-06 MED ORDER — PREDNISONE 10 MG PO TABS: 10.0000 mg | ORAL_TABLET | Freq: Every day | ORAL | 0 refills | Status: AC

## 2017-09-06 NOTE — Telephone Encounter (Signed)
Spoke to patient's wife, [name] about results of x-ray of his tibia. X-ray showed some degenerative changes and a benign lucent lesion doubtful of any clinical significance. [name] informed me that his pain was worse and the swelling in his left knee was getting worse. He has been unable to stand without assistance or put any pressure on the left leg despite wearing the brace. He has tried rotating Tylenol and Aleve, ice and elevation without relief. Consulted with Dr. Janese Banks regarding plan of care and it was agreed to start a short course of prednisone until his scheduled appointment to see ortho next Wednesday.10 mg Prednisone tablets once daily were called in to his pharmacy. [name] and patient agreeable to plan.

## 2017-09-11 ENCOUNTER — Other Ambulatory Visit: Payer: Self-pay | Admitting: *Deleted

## 2017-09-11 ENCOUNTER — Ambulatory Visit (INDEPENDENT_AMBULATORY_CARE_PROVIDER_SITE_OTHER): Payer: PPO | Admitting: Physician Assistant

## 2017-09-11 DIAGNOSIS — M1712 Unilateral primary osteoarthritis, left knee: Secondary | ICD-10-CM | POA: Diagnosis not present

## 2017-09-11 MED ORDER — METHYLPREDNISOLONE ACETATE 40 MG/ML IJ SUSP
40.0000 mg | INTRAMUSCULAR | Status: AC | PRN
  Administered 2017-09-11: 40 mg via INTRA_ARTICULAR

## 2017-09-11 MED ORDER — FLUTICASONE-SALMETEROL 500-50 MCG/DOSE IN AEPB: 1.0000 | INHALATION_SPRAY | Freq: Two times a day (BID) | RESPIRATORY_TRACT | 11 refills | Status: DC

## 2017-09-11 MED ORDER — TRAMADOL HCL 50 MG PO TABS: 50.0000 mg | ORAL_TABLET | Freq: Four times a day (QID) | ORAL | 0 refills | Status: DC | PRN

## 2017-09-11 MED ORDER — LIDOCAINE HCL 1 % IJ SOLN
3.0000 mL | INTRAMUSCULAR | Status: AC | PRN
  Administered 2017-09-11: 3 mL

## 2017-09-11 MED ORDER — TIOTROPIUM BROMIDE MONOHYDRATE 1.25 MCG/ACT IN AERS: 1.2500 | INHALATION_SPRAY | Freq: Two times a day (BID) | RESPIRATORY_TRACT | 11 refills | Status: DC

## 2017-09-11 MED FILL — CABOMETYX 20 MG TABLET: 20 | 30 days supply | Qty: 30 | Fill #1

## 2017-09-11 NOTE — Progress Notes (Signed)
Office Visit Note   Patient: Glen Davis           Date of Birth: [birth year]           MRN: [id number] Visit Date: 09/11/2017              Requested by: Dion Body, MD [place] [facility] [place], [place] [address] PCP: Dion Body, MD   Assessment & Plan: Visit Diagnoses:  1. Primary osteoarthritis of left knee     Plan: He is given a handout on supplemental injections.  We will obtain a Monovisc injection and have him return once this is available.  Discussed with her quad strengthening exercises and knee friendly exercises.  Questions encouraged and answered by Dr. Ninfa Linden and myself.  Follow-Up Instructions: Return for Supplemental injection.   Orders:  Orders Placed This Encounter  Procedures  . Large Joint Injection/Arthrocentesis   Meds ordered this encounter  Medications  . traMADol (ULTRAM) 50 MG tablet    Sig: Take 1 tablet (50 mg total) by mouth every 6 (six) hours as needed.    Dispense:  40 tablet    Refill:  0      Procedures: Large Joint Inj Date/Time: 09/11/2017 4:58 PM Performed by: Pete Pelt Authorized by: Pete Pelt   Consent Given by:  Patient Indications:  Pain Location:  Knee Site:  L knee Needle Size:  22 G Approach:  Superolateral Ultrasound Guidance: No   Fluoroscopic Guidance: No   Arthrogram: No   Medications:  40 mg methylPREDNISolone acetate 40 MG/ML; 3 mL lidocaine 1 % Aspiration Attempted: Yes   Aspirate amount (mL):  5 Aspirate:  Yellow Patient tolerance:  Patient tolerated the procedure well with no immediate complications     Clinical Data: No additional findings.   Subjective: Left knee pain  HPI Glen Davis is a 76 year old male was seen for the first time for left knee pain is been ongoing for at least the past 2 months becoming worse over the last 4 weeks.  Denies any injury.  Does have a giving way sensation in the knee but no catching locking or painful popping.   He has pain in the knee with sleeping.  He is tried ice elevation Tylenol and knee brace with no real relief.  Pains mostly on the inner side of his knee.  He notes that squatting causes him significant pain. Radiographs reviewed on epic dated 09/02/2017 2 views of the left knee shows near  bone-on-bone medial compartment and moderate patellofemoral arthritic changes.  Lateral compartment appears well preserved.  No acute fractures.  Lucency is seen within the proximal tibia dedicated radiographs of the left tibia were obtained on 09/06/2017.  They show a small lucency in the anterior cortex of the proximal tibia this appears benign.  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 Review of Systems  Denies chest pain, nausea, vomiting, fevers OR chills.Please see HPI Objective: Vital Signs: There were no vitals taken for this visit.  Physical Exam  Constitutional: He is oriented to person, place, and time. He appears well-developed and well-nourished. No distress.  Neurological: He is alert and oriented to person, place, and time.  Skin: He is not diaphoretic.  Psychiatric: He has a normal mood and affect. His behavior is normal.    Ortho Exam  Specialty Comments:  No  specialty comments available.  Imaging: No results found.   PMFS History: Patient Active Problem List   Diagnosis Date Noted  . Acute sinusitis 02/06/2017  . Cough 10/09/2016  . SOB (shortness of breath) 09/13/2016  . Syncope 08/17/2016  . Situational anxiety 06/05/2016  . Cancer of left kidney excluding renal pelvis ([place]) 2020/01/316  . 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 Coronary artery disease involving native coronary artery of native heart without angina pectoris 03/16/2016  . Essential (primary) hypertension 03/16/2016  . Hypertriglyceridemia 03/16/2016  . Left thyroid nodule  02/03/2016  . Pulmonary nodules/lesions, multiple 02/03/2016  . Counseling regarding goals of care 01/30/2016  . Vaccine counseling 01/30/2016  . 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 Melanoma of skin (Glen Davis) 01/12/2016  . Breathlessness on exertion 04/25/2015   Past Medical History:  Diagnosis Date  . CAD (coronary artery disease)   . Cancer ([place]len [place])    left arm  . COPD (chronic obstructive pulmonary disease) ([place])   . Hypertension   . Kidney stone   . Lung cancer (Glen Davis)   . Melanoma (Glen [place]) 01/24/2016   right shoulder  . Thyroid nodule 02/02/2016   left BENIGN THYROID NODULE by FNA    Family History  Problem Relation Age of Onset  . Hodgkin's lymphoma Daughter 5  . Kidney cancer Neg Hx   . Kidney disease Neg Hx   . Prostate cancer Neg Hx     Past Surgical History:  Procedure Laterality Date  . arm surgery Left    arm  . cardiac stents  2011   Angioplasty / Stenting Femoral-X2  . COLONOSCOPY  04/01/12  . CORONARY ANGIOPLASTY    . EXCISION MELANOMA WITH SENTINEL LYMPH NODE BIOPSY Right 01/24/2016   Procedure: EXCISION MELANOMA Right Shoulder;  Surgeon: Robert Bellow, MD;  Location: [facility] ORS;  Service: General;  Laterality: Right;  . LAPAROSCOPIC NEPHRECTOMY, HAND ASSISTED Left 04/24/2016   Procedure: HAND ASSISTED LAPAROSCOPIC NEPHRECTOMY;  Surgeon: Hollice Espy, MD;  Location: [facility] ORS;  Service: Urology;  Laterality: Left;  Glen Davis VIDEO ASSISTED THORACOSCOPY (VATS)/THOROCOTOMY Left 03/08/2016   Procedure: VIDEO ASSISTED THORACOSCOPY (VATS) with lung biopsy - Left ;  Surgeon: Robert Bellow, MD;  Location: [facility] ORS;  Service: General;  Laterality: Left;   Social History   Occupational History  . Not on file.   Social History Main Topics  . Smoking status: Former Smoker    Types: Cigarettes    Start date: 01/18/1956    Quit date: 01/17/1969  . Smokeless tobacco: Never Used  . Alcohol use No     Comment: BEER OCC  . Drug use: No  . Sexual activity:  Not on file

## 2017-09-12 ENCOUNTER — Other Ambulatory Visit (INDEPENDENT_AMBULATORY_CARE_PROVIDER_SITE_OTHER): Payer: Self-pay

## 2017-09-13 ENCOUNTER — Ambulatory Visit: Payer: PPO | Admitting: Internal Medicine

## 2017-09-13 ENCOUNTER — Other Ambulatory Visit: Payer: PPO

## 2017-09-19 DIAGNOSIS — R0602 Shortness of breath: Secondary | ICD-10-CM | POA: Diagnosis not present

## 2017-09-19 DIAGNOSIS — R05 Cough: Secondary | ICD-10-CM | POA: Diagnosis not present

## 2017-09-19 DIAGNOSIS — J449 Chronic obstructive pulmonary disease, unspecified: Secondary | ICD-10-CM | POA: Diagnosis not present

## 2017-09-19 DIAGNOSIS — C642 Malignant neoplasm of left kidney, except renal pelvis: Secondary | ICD-10-CM | POA: Diagnosis not present

## 2017-09-27 DIAGNOSIS — M5414 Radiculopathy, thoracic region: Secondary | ICD-10-CM | POA: Diagnosis not present

## 2017-09-27 DIAGNOSIS — M546 Pain in thoracic spine: Secondary | ICD-10-CM | POA: Diagnosis not present

## 2017-10-03 ENCOUNTER — Ambulatory Visit
Admission: RE | Admit: 2017-10-03 | Discharge: 2017-10-03 | Disposition: A | Discharge status: Home / Self Care | Payer: PPO | Source: Ambulatory Visit | Attending: Oncology | Admitting: Oncology

## 2017-10-03 DIAGNOSIS — C7801 Secondary malignant neoplasm of right lung: Secondary | ICD-10-CM | POA: Insufficient documentation

## 2017-10-03 DIAGNOSIS — J439 Emphysema, unspecified: Secondary | ICD-10-CM | POA: Insufficient documentation

## 2017-10-03 DIAGNOSIS — I700 Atherosclerosis of aorta: Secondary | ICD-10-CM | POA: Diagnosis not present

## 2017-10-03 DIAGNOSIS — C642 Malignant neoplasm of left kidney, except renal pelvis: Secondary | ICD-10-CM | POA: Diagnosis not present

## 2017-10-03 DIAGNOSIS — J180 Bronchopneumonia, unspecified organism: Secondary | ICD-10-CM | POA: Diagnosis not present

## 2017-10-03 DIAGNOSIS — I2510 Atherosclerotic heart disease of native coronary artery without angina pectoris: Secondary | ICD-10-CM | POA: Diagnosis not present

## 2017-10-03 DIAGNOSIS — C7802 Secondary malignant neoplasm of left lung: Secondary | ICD-10-CM | POA: Diagnosis not present

## 2017-10-03 DIAGNOSIS — C7800 Secondary malignant neoplasm of unspecified lung: Secondary | ICD-10-CM | POA: Diagnosis not present

## 2017-10-03 LAB — POCT I-STAT CREATININE: CREATININE: 1.3 mg/dL — AB (ref 0.61–1.24)

## 2017-10-03 MED ORDER — IOPAMIDOL (ISOVUE-300) INJECTION 61%
75.0000 mL | Freq: Once | INTRAVENOUS | Status: AC | PRN
  Administered 2017-10-03: 75 mL via INTRAVENOUS

## 2017-10-04 ENCOUNTER — Other Ambulatory Visit: Payer: Self-pay

## 2017-10-04 ENCOUNTER — Inpatient Hospital Stay: Payer: PPO | Attending: Internal Medicine

## 2017-10-04 ENCOUNTER — Inpatient Hospital Stay (HOSPITAL_BASED_OUTPATIENT_CLINIC_OR_DEPARTMENT_OTHER): Payer: PPO | Admitting: Internal Medicine

## 2017-10-04 ENCOUNTER — Telehealth: Payer: Self-pay | Admitting: Pharmacist

## 2017-10-04 ENCOUNTER — Encounter: Payer: Self-pay | Admitting: Internal Medicine

## 2017-10-04 VITALS — BP 145/72 | HR 60 | Temp 97.8°F | Resp 22 | Ht 73.0 in | Wt 211.0 lb

## 2017-10-04 DIAGNOSIS — Z79899 Other long term (current) drug therapy: Secondary | ICD-10-CM

## 2017-10-04 DIAGNOSIS — I2510 Atherosclerotic heart disease of native coronary artery without angina pectoris: Secondary | ICD-10-CM | POA: Diagnosis not present

## 2017-10-04 DIAGNOSIS — N281 Cyst of kidney, acquired: Secondary | ICD-10-CM

## 2017-10-04 DIAGNOSIS — Z85820 Personal history of malignant melanoma of skin: Secondary | ICD-10-CM | POA: Insufficient documentation

## 2017-10-04 DIAGNOSIS — Z87442 Personal history of urinary calculi: Secondary | ICD-10-CM | POA: Insufficient documentation

## 2017-10-04 DIAGNOSIS — E041 Nontoxic single thyroid nodule: Secondary | ICD-10-CM

## 2017-10-04 DIAGNOSIS — Z7982 Long term (current) use of aspirin: Secondary | ICD-10-CM

## 2017-10-04 DIAGNOSIS — J449 Chronic obstructive pulmonary disease, unspecified: Secondary | ICD-10-CM | POA: Diagnosis not present

## 2017-10-04 DIAGNOSIS — C642 Malignant neoplasm of left kidney, except renal pelvis: Secondary | ICD-10-CM

## 2017-10-04 DIAGNOSIS — I10 Essential (primary) hypertension: Secondary | ICD-10-CM | POA: Diagnosis not present

## 2017-10-04 DIAGNOSIS — C7802 Secondary malignant neoplasm of left lung: Secondary | ICD-10-CM | POA: Insufficient documentation

## 2017-10-04 DIAGNOSIS — C7801 Secondary malignant neoplasm of right lung: Secondary | ICD-10-CM

## 2017-10-04 DIAGNOSIS — T451X5S Adverse effect of antineoplastic and immunosuppressive drugs, sequela: Secondary | ICD-10-CM | POA: Insufficient documentation

## 2017-10-04 DIAGNOSIS — L271 Localized skin eruption due to drugs and medicaments taken internally: Secondary | ICD-10-CM

## 2017-10-04 DIAGNOSIS — Z9221 Personal history of antineoplastic chemotherapy: Secondary | ICD-10-CM | POA: Insufficient documentation

## 2017-10-04 DIAGNOSIS — Z87891 Personal history of nicotine dependence: Secondary | ICD-10-CM | POA: Diagnosis not present

## 2017-10-04 DIAGNOSIS — R05 Cough: Secondary | ICD-10-CM

## 2017-10-04 LAB — CBC WITH DIFFERENTIAL/PLATELET
BASOS ABS: 0 10*3/uL (ref 0–0.1)
Basophils Relative: 1 %
Eosinophils Absolute: 0.1 10*3/uL (ref 0–0.7)
Eosinophils Relative: 1 %
HEMATOCRIT: 42.2 % (ref 40.0–52.0)
HEMOGLOBIN: 14.8 g/dL (ref 13.0–18.0)
LYMPHS PCT: 16 %
Lymphs Abs: 1.4 10*3/uL (ref 1.0–3.6)
MCH: 33.3 pg (ref 26.0–34.0)
MCHC: 35.2 g/dL (ref 32.0–36.0)
MCV: 94.8 fL (ref 80.0–100.0)
Monocytes Absolute: 0.6 10*3/uL (ref 0.2–1.0)
Monocytes Relative: 6 %
NEUTROS ABS: 6.9 10*3/uL — AB (ref 1.4–6.5)
NEUTROS PCT: 76 %
PLATELETS: 209 10*3/uL (ref 150–440)
RBC: 4.45 MIL/uL (ref 4.40–5.90)
RDW: 14 % (ref 11.5–14.5)
WBC: 9 10*3/uL (ref 3.8–10.6)

## 2017-10-04 LAB — COMPREHENSIVE METABOLIC PANEL
ALBUMIN: 3.5 g/dL (ref 3.5–5.0)
ALT: 16 U/L — ABNORMAL LOW (ref 17–63)
ANION GAP: 7 (ref 5–15)
AST: 19 U/L (ref 15–41)
Alkaline Phosphatase: 73 U/L (ref 38–126)
BUN: 16 mg/dL (ref 6–20)
CO2: 29 mmol/L (ref 22–32)
Calcium: 9.2 mg/dL (ref 8.9–10.3)
Chloride: 100 mmol/L — ABNORMAL LOW (ref 101–111)
Creatinine, Ser: 1.24 mg/dL (ref 0.61–1.24)
GFR calc Af Amer: >60 mL/min (ref >60)
GFR, EST NON AFRICAN AMERICAN: 55 mL/min — AB (ref >60)
Glucose, Bld: 105 mg/dL — ABNORMAL HIGH (ref 65–99)
POTASSIUM: 3.5 mmol/L (ref 3.5–5.1)
Sodium: 136 mmol/L (ref 135–145)
TOTAL PROTEIN: 6.7 g/dL (ref 6.5–8.1)
Total Bilirubin: 0.7 mg/dL (ref 0.3–1.2)

## 2017-10-04 LAB — LACTATE DEHYDROGENASE: LDH: 191 U/L (ref 98–192)

## 2017-10-04 MED ORDER — SUNITINIB MALATE 50 MG PO CAPS: 50.0000 mg | ORAL_CAPSULE | Freq: Every day | ORAL | 4 refills | Status: DC

## 2017-10-04 MED ORDER — LEVOFLOXACIN 500 MG PO TABS: 500.0000 mg | ORAL_TABLET | Freq: Every day | ORAL | 0 refills | Status: DC

## 2017-10-04 NOTE — Progress Notes (Signed)
[facility] OFFICE PROGRESS NOTE  Patient Care Team: Dion Body, MD as PCP - General (Family Medicine) Jannet Mantis, MD (Dermatology) Bary Castilla Forest Gleason, MD (General Surgery) Hollice Espy, MD as Consulting Physician (Urology)  No matching staging information was found for the patient.   Oncology History   # MAY- June 2017- METASTATIC CLEAR CELL; LEFT RENAL CA/STAGE IV; [place]- G-3; INTERMEDIATE RISK;  [bil Pul lung nodules;incidental s/p Bx Dr.Byrnett/Dr.Oaks] s/p Cytoreductive nephrectomy; Dr.Brandon- [id number]; July 11th 2017 CT- Enlarging Lung nodules  # Aug 7th 2017- PAZOPANIB; STOP SEP 2017 [pancreatitis/poor tol]  # OCT 1 week- START NIVO q 2W x4; DEC 8th CT- "Progression"; Continue Opdivo;APRIL-MAY 2018- STABLE LUNG NODULES [stopped sec to severe cough; NO Pneumonitis]  # May 31st 2018- Huetter 60mg /day; July 31st CT lung- improved lung nodules; Sep 1st week- cabo- 40mg /day [dose reduced sec to mult intol]; Oct 1st 2week-Cabo 20mg /day;   # NOV 15th 2018- CT chest- slight progression of lung nodules [poor tol to higer doses]; Recomm SUTENT 50 mg 2w-on  &1 w-OFF  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  Most recently patient is on cabo 20 mg a day because of poor tolerance-hand-foot syndrome diarrhea.  Patient is here to review the results of his CT scan.  The patient continues to have cough.  Productive.  No hemoptysis.  Continues to have shortness of breath on exertion.  Currently on inhalers.  No nausea no vomiting no headaches.  Appetite is fair.  He intermittently continues to  complain of pain in his soles.  He has been using urea cream.  REVIEW OF SYSTEMS:  A complete 10 point review of system is done which is negative except mentioned above/history of present illness.   PAST MEDICAL HISTORY :  Past Medical History:  Diagnosis Date  . CAD (coronary artery disease)   . Cancer ([place])    left arm  . COPD (chronic obstructive pulmonary disease) ([place])   . Hypertension   . Kidney stone   . Lung cancer ([place])   . Melanoma ([place]) 01/24/2016   right shoulder  . Thyroid nodule 02/02/2016   left BENIGN THYROID NODULE by FNA    PAST SURGICAL HISTORY :   Past Surgical History:  Procedure Laterality Date  . arm surgery Left    arm  . cardiac stents  2011   Angioplasty / Stenting Femoral-X2  . COLONOSCOPY  04/01/12  . CORONARY ANGIOPLASTY    . EXCISION MELANOMA Right Shoulder Right 01/24/2016   Performed by Robert Bellow, MD at [facility] ORS  . HAND ASSISTED LAPAROSCOPIC NEPHRECTOMY Left 04/24/2016   Performed by Hollice Espy, MD at [facility] ORS  . VIDEO ASSISTED THORACOSCOPY (VATS) with lung biopsy - Left Left 03/08/2016   Performed by Robert Bellow, MD at [facility] ORS    FAMILY HISTORY :   Family History  Problem Relation Age of Onset  . Hodgkin's lymphoma Daughter 5  . Kidney cancer Neg Hx   . Kidney disease Neg Hx   . Prostate cancer Neg Hx     SOCIAL HISTORY:  Social History   Tobacco Use  . Smoking status: Former Smoker    Types: Cigarettes    Start date: 01/18/1956    Last attempt to quit: 01/17/1969    Years since quitting: 48.7  . Smokeless tobacco: Never Used  Substance Use Topics  . Alcohol use: No    Alcohol/week: 0.0 oz    Comment: BEER OCC  . Drug use: No    ALLERGIES:  is allergic to lisinopril and other.  MEDICATIONS:  Current Outpatient Medications  Medication Sig Dispense Refill  . acetaminophen (TYLENOL) 500 MG tablet Take 500 mg by mouth every 6 (six) hours as needed for moderate pain.    Marland Kitchen albuterol (PROVENTIL HFA;VENTOLIN  HFA) 108 (90 Base) MCG/ACT inhaler Inhale 2 puffs into the lungs every 6 (six) hours as needed for wheezing or shortness of breath. 1 Inhaler 2  . amLODipine (NORVASC) 5 MG tablet Take 1 tablet (5 mg total) by mouth 2 (two) times daily. 60 tablet 0  . aspirin EC 81 MG tablet Take 81 mg by mouth daily.    Marland Kitchen atorvastatin (LIPITOR) 40 MG tablet Take 40 mg by mouth daily.    . chlorpheniramine-HYDROcodone (TUSSIONEX) 10-8 MG/5ML SUER Take 5 mLs by mouth at bedtime as needed for cough. 140 mL 0  . clopidogrel (PLAVIX) 75 MG tablet Take 75 mg by mouth daily.    Marland Kitchen docusate sodium (COLACE) 100 MG capsule Take 1 capsule (100 mg total) by mouth 2 (two) times daily. 60 capsule 3  . DULoxetine (CYMBALTA) 30 MG capsule Take 2 capsules by mouth daily.    . Fluticasone-Salmeterol (ADVAIR DISKUS) 500-50 MCG/DOSE AEPB Inhale 1 puff into the lungs 2 (two) times daily. 60 each 11  . ipratropium-albuterol (DUONEB) 0.5-2.5 (3) MG/3ML SOLN Take 3 mLs by nebulization every 4 (four) hours as needed. 360 mL 3  . loratadine (CLARITIN) 10 MG tablet Take 10 mg by mouth daily.    Marland Kitchen losartan (COZAAR) 50 MG tablet Take 50 mg by mouth every morning.     Marland Kitchen MELATONIN PO Take 10 mg by mouth at bedtime.     . metoprolol succinate (TOPROL-XL) 50 MG 24 hr tablet Take 50 mg by mouth every morning.     . OXYGEN Inhale 3 L into the lungs continuous. bedtime    . tamsulosin (FLOMAX) 0.4 MG CAPS capsule Take 0.4 mg by mouth every morning.     . Tiotropium Bromide Monohydrate (SPIRIVA RESPIMAT) 1.25 MCG/ACT AERS Inhale 1.25 Act into the lungs 2 (two) times daily. 1 Inhaler 11  . levofloxacin (LEVAQUIN) 500 MG tablet Take 1 tablet (500 mg total) daily by mouth. 7 tablet 0  . magic mouthwash w/lidocaine SOLN Take 5 mLs by mouth 4 (four) times daily. (Patient not taking: Reported on 10/04/2017) 480 mL 3  . nitroGLYCERIN (NITROSTAT) 0.4 MG SL tablet Place 0.4 mg under the tongue every 5 (five) minutes as needed.     . SUNItinib (SUTENT) 50 MG  capsule Take 1 capsule (50 mg total) daily by mouth. Take for 2 weeks ON, then 1 week OFF. Do NOT start until okay by MD. 14 capsule 4  . traMADol (ULTRAM) 50 MG tablet Take 1 tablet (50 mg total) by mouth every 6 (six) hours as needed. (Patient not taking: Reported on 10/04/2017) 40 tablet 0   No current facility-administered medications for this visit.     PHYSICAL EXAMINATION: ECOG PERFORMANCE STATUS: 0 - Asymptomatic  BP (!) 145/72   Pulse 60  Temp 97.8 F (36.6 C) (Tympanic)   Resp (!) 22   Ht 6\' 1"  (1.854 m)   Wt 211 lb (95.7 kg)   BMI 27.84 kg/m   Filed Weights   10/04/17 1225  Weight: 211 lb (95.7 kg)    GENERAL: Well-nourished well-developed; Alert, no distress and comfortable. Accompanied by his wife/daughter EYES: no pallor or icterus OROPHARYNX: no thrush or ulceration; NECK: supple, no masses felt LYMPH:  no palpable lymphadenopathy in the cervical, axillary or inguinal regions LUNGS: Decreased breath sounds to auscultation at the bases.  No wheeze or crackles HEART/CVS: regular rate & rhythm and no murmurs; No lower extremity edema ABDOMEN:abdomen soft, non-tender and normal bowel sounds Musculoskeletal:no cyanosis of digits and no clubbing  PSYCH: alert & oriented x 3 with fluent speech NEURO: no focal motor/sensory deficits SKIN:  Skin rash resolved.  LABORATORY DATA:  I have reviewed the data as listed    Component Value Date/Time   NA 136 10/04/2017 1126   NA 146 (H) 05/25/2016 1514   K 3.5 10/04/2017 1126   CL 100 (L) 10/04/2017 1126   CO2 29 10/04/2017 1126   GLUCOSE 105 (H) 10/04/2017 1126   BUN 16 10/04/2017 1126   BUN 19 05/25/2016 1514   CREATININE 1.24 10/04/2017 1126   CALCIUM 9.2 10/04/2017 1126   PROT 6.7 10/04/2017 1126   ALBUMIN 3.5 10/04/2017 1126   AST 19 10/04/2017 1126   ALT 16 (L) 10/04/2017 1126   ALKPHOS 73 10/04/2017 1126   BILITOT 0.7 10/04/2017 1126   GFRNONAA 55 (L) 10/04/2017 1126   GFRAA >60 10/04/2017 1126    No  results found for: SPEP, UPEP  Lab Results  Component Value Date   WBC 9.0 10/04/2017   NEUTROABS 6.9 (H) 10/04/2017   HGB 14.8 10/04/2017   HCT 42.2 10/04/2017   MCV 94.8 10/04/2017   PLT 209 10/04/2017      Chemistry      Component Value Date/Time   NA 136 10/04/2017 1126   NA 146 (H) 05/25/2016 1514   K 3.5 10/04/2017 1126   CL 100 (L) 10/04/2017 1126   CO2 29 10/04/2017 1126   BUN 16 10/04/2017 1126   BUN 19 05/25/2016 1514   CREATININE 1.24 10/04/2017 1126      Component Value Date/Time   CALCIUM 9.2 10/04/2017 1126   ALKPHOS 73 10/04/2017 1126   AST 19 10/04/2017 1126   ALT 16 (L) 10/04/2017 1126   BILITOT 0.7 10/04/2017 1126     RADIOGRAPHIC STUDIES: I have personally reviewed the radiological images as listed and agreed with the findings in the report. Ct Chest W Contrast  Result Date: 10/03/2017 CLINICAL DATA:  Metastatic renal cell carcinoma with a history of left nephrectomy. Patient is on chemotherapy for pulmonary metastases. Restaging. EXAM: CT CHEST WITH CONTRAST TECHNIQUE: Multidetector CT imaging of the chest was performed during intravenous contrast administration. CONTRAST:  31mL ISOVUE-300 IOPAMIDOL (ISOVUE-300) INJECTION 61% COMPARISON:  06/18/2017 chest CT. FINDINGS: Cardiovascular: Normal heart size. No significant pericardial fluid/thickening. Left anterior descending and right coronary atherosclerosis. Mildly atherosclerotic thoracic aorta with stable ectatic 4.2 cm ascending thoracic aorta. Normal caliber pulmonary arteries. No central pulmonary emboli. Mediastinum/Nodes: Hypodense bilateral thyroid lobe nodules, largest 1.2 cm in the upper right thyroid lobe, which was incompletely imaged on the prior chest CT. Unremarkable esophagus. No axillary adenopathy. Newly mildly enlarged 1.0 cm subcarinal node (series 2/ image 83). Otherwise no pathologically enlarged mediastinal or hilar nodes. Lungs/Pleura: No pneumothorax. No pleural effusion.  Severe  centrilobular emphysema with diffuse bronchial wall thickening. Saber sheath trachea. New patchy tree-in-bud opacities, patchy peribronchovascular consolidation and ground-glass attenuation and increased bronchial wall thickening throughout the left lower lobe, compatible with bronchopneumonia. Subsegmental left lower lobe atelectasis versus developing scarring. Multiple irregular solid pulmonary nodules randomly scattered throughout both lungs are mildly increased in size. For example an apical left upper lobe 1.5 x 1.1 cm nodule (series 3/ image 39), previously 1.2 x 0.9 cm, mildly increased. Irregular medial left upper lobe 1.0 cm nodule (series 3/ image 54), previously 0.8 cm, mildly increased. Medial basilar right lower lobe 1.6 x 1.2 cm nodule (series 3/ image 129), previously 1.4 x 1.0 cm, mildly increased. Separate right lower lobe 1.2 x 0.9 cm nodule (series 3/image 121), previously 1.2 x 0.6 cm, minimally increased . Upper abdomen: Partially visualized simple appearing 2.1 cm renal cyst in the anterior interpolar right kidney. Partially visualized postsurgical changes from left nephrectomy. Musculoskeletal: No aggressive appearing focal osseous lesions. Moderate thoracic spondylosis. IMPRESSION: 1. Bilateral pulmonary metastases are all mildly increased in size . 2. New left lower lobe bronchopneumonia, potentially due to aspiration . 3. New mild nonspecific subcarinal lymphadenopathy. 4. Two-vessel coronary atherosclerosis. 5. 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 Recommend annual imaging followup by CTA or MRA. This recommendation follows 2010 ACCF/AHA/AATS/ACR/ASA/SCA/SCAI/SIR/STS/SVM Guidelines for the Diagnosis and Management of Patients with Thoracic Aortic Disease. Circulation. 2010; 121: Y782-N562. Aortic Atherosclerosis (ICD10-I70.0) and Emphysema (ICD10-J43.9). These results will be called to the ordering clinician or representative by the Radiology Department at the imaging location.  Electronically Signed   By: Ilona Sorrel M.D.   On: 10/03/2017 16:26    ASSESSMENT & PLAN:  Cancer of left kidney excluding renal pelvis (HCC) Secondary tolerance # Left kidney cancer metastatic to the lung- currently on [facility] [since May 30th].  November 15 the CT scan shows-progression of the lung nodules bilateral [by few millimeters]; also left-sided pneumonia [see discussion below] patient currently on [place] to 20 mg a day.  Poor tolerance to higher doses.   #Long discussion with the patient regarding palliative nature of the treatments/not curative.  [place] at this time secondary to poor tolerance/progression on the CT scan.  Recommend starting Sutent 50 mg 2 weeks on 1 week off schedule.   #Again discussed with the patient the potential side effects include mucositis diarrhea fatigue hand-foot syndrome; hypothyroidism.  Check thyroid at next visit.  #Hand-foot syndrome-from Cabo; improved; continue symptomatic management.  #Worsening cough-possibly related to left lower lobe infiltrate/pneumonia; recommend Levaquin for 7 days.  #Follow-up in approximately 3 weeks labs/TSH.  Discussed with the pharmacist regarding Sutent.  # I reviewed the blood work- with the patient in detail; also reviewed the imaging independently [as summarized above]; and with the patient in detail.  # 40 minutes face-to-face with the patient discussing the above plan of care; more than 50% of time spent on prognosis/ natural history; counseling and coordination.   Orders Placed This Encounter  Procedures  . CBC with Differential/Platelet    Standing Status:   Future    Standing Expiration Date:   10/04/2018  . Comprehensive metabolic panel    Standing Status:   Future    Standing Expiration Date:   10/04/2018  . TSH    Standing Status:   Future    Standing Expiration Date:   10/04/2018      Cammie Sickle, MD 10/04/2017 8:38 PM

## 2017-10-04 NOTE — Assessment & Plan Note (Addendum)
Secondary tolerance # Left kidney cancer metastatic to the lung- currently on [facility] [since May 30th].  November 15 the CT scan shows-progression of the lung nodules bilateral [by few millimeters]; also left-sided pneumonia [see discussion below] patient currently on [place] to 20 mg a day.  Poor tolerance to higher doses.   #Long discussion with the patient regarding palliative nature of the treatments/not curative.  [place] at this time secondary to poor tolerance/progression on the CT scan.  Recommend starting Sutent 50 mg 2 weeks on 1 week off schedule.   #Again discussed with the patient the potential side effects include mucositis diarrhea fatigue hand-foot syndrome; hypothyroidism.  Check thyroid at next visit.  #Hand-foot syndrome-from [place]; improved; continue symptomatic management.  #Worsening cough-possibly related to left lower lobe infiltrate/pneumonia; recommend Levaquin for 7 days.  #Follow-up in approximately 3 weeks labs/TSH.  Discussed with the pharmacist regarding Sutent.  # I reviewed the blood work- with the patient in detail; also reviewed the imaging independently [as summarized above]; and with the patient in detail.  # 40 minutes face-to-face with the patient discussing the above plan of care; more than 50% of time spent on prognosis/ natural history; counseling and coordination.

## 2017-10-04 NOTE — Telephone Encounter (Addendum)
Oral Oncology Patient Advocate Encounter  Called [employer] to see how much grant he has left $2030.25.  His co-pay for Sunitinib is $421.27.  ID # [id number]  03/09/2017-03/09/2018   [place] Patient Advocate [phone] 10/04/2017 3:13 PM +

## 2017-10-04 NOTE — Progress Notes (Signed)
C/o "right lateral back intermittent pain. I could be sitting still and then I feel a sharp pain like a knife stabbing me. It's worse in the evening."  Patient c/o head cold/congestion. denies fevers. States that he has occasionally chills. requesting rf on tramadol.

## 2017-10-04 NOTE — Telephone Encounter (Signed)
Oral Oncology Pharmacist Encounter  Received new prescription for Sutent (sunitinib) for the treatment of metastatic kidney cancer, planned duration until disease progression or unacceptable drug toxicity. Glen Davis is changing from cabozantinib to sunitinib due to intolerance/disease progression.  CBC/CMP from 10/04/17 assessed, no relevant lab abnormalities. BP today only slightly elevated, per patient his BP medications are managed by his PCP; will monitor BP after start of medication. Prescription dose and frequency assessed.   Current medication list in Epic reviewed, no relevant DDIs with sunitinib identified.  Prescription has been e-scribed to the [facility] for benefits analysis and approval.  Patient education Counseled patient and his wife following today's MD visited. Reviewed administration, dosing, side effects, monitoring, drug-food interactions, safe handling, storage, and disposal. Patient will take 1 capsule (50 mg total) daily by mouth. 2 weeks- On and 1 week OFF. Do NOT start until okay by MD.  **He will not start his sunitinib until after his levofloxacin course has finished. Patient is aware.  Side effects include but not limited to: decrease plt/WBC/Hgb, fatigue ,HTN, N/V/D, mouth sores, decreased appetite, heartburn.    Reviewed with patient importance of keeping a medication schedule and plan for any missed doses.  Glen Davis and his wife voiced understanding and appreciation. All questions answered.  Oral Oncology Clinic will continue to follow for insurance authorization, copayment issues, initial counseling and start date.  Provided patient with [facility] [place] [facility] phone number. Patient knows to call the office with questions or concerns. Oral Chemotherapy Navigation Clinic will continue to follow.  Glen Davis, PharmD, BCPS Hematology/Oncology Clinical Pharmacist [facility] [phone]  10/04/2017 1:18 PM

## 2017-10-07 ENCOUNTER — Other Ambulatory Visit: Payer: Self-pay | Admitting: Pharmacist

## 2017-10-07 DIAGNOSIS — C642 Malignant neoplasm of left kidney, except renal pelvis: Secondary | ICD-10-CM

## 2017-10-07 MED FILL — SUTENT 50 MG CAPS: 50 | 14 days supply | Qty: 14 | Fill #0

## 2017-10-07 NOTE — Telephone Encounter (Signed)
Oral Oncology Patient Advocate Encounter  Patients Sunitinib was shipped 10/07/2017 from [facility].   [place] Patient Advocate [phone] 10/07/2017 2:47 PM

## 2017-10-15 ENCOUNTER — Telehealth: Payer: Self-pay | Admitting: *Deleted

## 2017-10-15 NOTE — Telephone Encounter (Signed)
Wife contacted cancer center- left vm for RN to return her phone call. Would like to know when patient should start the Sutent.  Wife's phone call returned [phone]  - He has received the Sutent in the mail. Per MD-patient may start at any time. Per patient's wife, last dose of Cabomyetx taken today.  Patient instructed to start Sutent tomorrow.

## 2017-10-19 DIAGNOSIS — R0602 Shortness of breath: Secondary | ICD-10-CM | POA: Diagnosis not present

## 2017-10-19 DIAGNOSIS — C642 Malignant neoplasm of left kidney, except renal pelvis: Secondary | ICD-10-CM | POA: Diagnosis not present

## 2017-10-19 DIAGNOSIS — R05 Cough: Secondary | ICD-10-CM | POA: Diagnosis not present

## 2017-10-19 DIAGNOSIS — J449 Chronic obstructive pulmonary disease, unspecified: Secondary | ICD-10-CM | POA: Diagnosis not present

## 2017-10-24 DIAGNOSIS — I10 Essential (primary) hypertension: Secondary | ICD-10-CM | POA: Diagnosis not present

## 2017-10-24 DIAGNOSIS — E781 Pure hyperglyceridemia: Secondary | ICD-10-CM | POA: Diagnosis not present

## 2017-10-24 DIAGNOSIS — R7303 Prediabetes: Secondary | ICD-10-CM | POA: Diagnosis not present

## 2017-10-25 ENCOUNTER — Inpatient Hospital Stay (HOSPITAL_BASED_OUTPATIENT_CLINIC_OR_DEPARTMENT_OTHER): Payer: PPO | Admitting: Internal Medicine

## 2017-10-25 ENCOUNTER — Inpatient Hospital Stay: Payer: PPO | Attending: Internal Medicine

## 2017-10-25 ENCOUNTER — Other Ambulatory Visit: Payer: Self-pay

## 2017-10-25 VITALS — BP 163/82 | HR 59 | Temp 98.1°F | Resp 12 | Ht 74.0 in | Wt 208.0 lb

## 2017-10-25 DIAGNOSIS — I2510 Atherosclerotic heart disease of native coronary artery without angina pectoris: Secondary | ICD-10-CM | POA: Insufficient documentation

## 2017-10-25 DIAGNOSIS — E041 Nontoxic single thyroid nodule: Secondary | ICD-10-CM | POA: Diagnosis not present

## 2017-10-25 DIAGNOSIS — Z85820 Personal history of malignant melanoma of skin: Secondary | ICD-10-CM | POA: Insufficient documentation

## 2017-10-25 DIAGNOSIS — C7800 Secondary malignant neoplasm of unspecified lung: Secondary | ICD-10-CM | POA: Diagnosis not present

## 2017-10-25 DIAGNOSIS — J449 Chronic obstructive pulmonary disease, unspecified: Secondary | ICD-10-CM | POA: Insufficient documentation

## 2017-10-25 DIAGNOSIS — Z7982 Long term (current) use of aspirin: Secondary | ICD-10-CM | POA: Diagnosis not present

## 2017-10-25 DIAGNOSIS — C642 Malignant neoplasm of left kidney, except renal pelvis: Secondary | ICD-10-CM

## 2017-10-25 DIAGNOSIS — Z87442 Personal history of urinary calculi: Secondary | ICD-10-CM

## 2017-10-25 DIAGNOSIS — R05 Cough: Secondary | ICD-10-CM

## 2017-10-25 DIAGNOSIS — Z79899 Other long term (current) drug therapy: Secondary | ICD-10-CM | POA: Insufficient documentation

## 2017-10-25 DIAGNOSIS — I10 Essential (primary) hypertension: Secondary | ICD-10-CM | POA: Insufficient documentation

## 2017-10-25 DIAGNOSIS — E039 Hypothyroidism, unspecified: Secondary | ICD-10-CM

## 2017-10-25 DIAGNOSIS — Z87891 Personal history of nicotine dependence: Secondary | ICD-10-CM | POA: Diagnosis not present

## 2017-10-25 LAB — COMPREHENSIVE METABOLIC PANEL
ALBUMIN: 3.9 g/dL (ref 3.5–5.0)
ALK PHOS: 86 U/L (ref 38–126)
ALT: 20 U/L (ref 17–63)
AST: 27 U/L (ref 15–41)
Anion gap: 9 (ref 5–15)
BUN: 12 mg/dL (ref 6–20)
CALCIUM: 9.2 mg/dL (ref 8.9–10.3)
CO2: 27 mmol/L (ref 22–32)
CREATININE: 1.36 mg/dL — AB (ref 0.61–1.24)
Chloride: 99 mmol/L — ABNORMAL LOW (ref 101–111)
GFR calc non Af Amer: 49 mL/min — ABNORMAL LOW (ref >60)
GFR, EST AFRICAN AMERICAN: 57 mL/min — AB (ref >60)
GLUCOSE: 105 mg/dL — AB (ref 65–99)
Potassium: 4 mmol/L (ref 3.5–5.1)
SODIUM: 135 mmol/L (ref 135–145)
Total Bilirubin: 0.9 mg/dL (ref 0.3–1.2)
Total Protein: 6.8 g/dL (ref 6.5–8.1)

## 2017-10-25 LAB — CBC WITH DIFFERENTIAL/PLATELET
BASOS PCT: 1 %
Basophils Absolute: 0 10*3/uL (ref 0–0.1)
EOS ABS: 0.1 10*3/uL (ref 0–0.7)
EOS PCT: 2 %
HCT: 45.5 % (ref 40.0–52.0)
Hemoglobin: 15.9 g/dL (ref 13.0–18.0)
Lymphocytes Relative: 23 %
Lymphs Abs: 1.6 10*3/uL (ref 1.0–3.6)
MCH: 33.2 pg (ref 26.0–34.0)
MCHC: 35 g/dL (ref 32.0–36.0)
MCV: 94.9 fL (ref 80.0–100.0)
MONO ABS: 0.4 10*3/uL (ref 0.2–1.0)
MONOS PCT: 5 %
Neutro Abs: 4.9 10*3/uL (ref 1.4–6.5)
Neutrophils Relative %: 69 %
Platelets: 154 10*3/uL (ref 150–440)
RBC: 4.79 MIL/uL (ref 4.40–5.90)
RDW: 15.2 % — AB (ref 11.5–14.5)
WBC: 7.1 10*3/uL (ref 3.8–10.6)

## 2017-10-25 LAB — PROTEIN, URINE, RANDOM: TOTAL PROTEIN, URINE: 9 mg/dL

## 2017-10-25 LAB — TSH: TSH: 4.997 u[IU]/mL — ABNORMAL HIGH (ref 0.350–4.500)

## 2017-10-25 MED ORDER — TRIAMCINOLONE ACETONIDE 0.5 % EX OINT: 1.0000 "application " | TOPICAL_OINTMENT | Freq: Two times a day (BID) | CUTANEOUS | 0 refills | Status: DC

## 2017-10-25 NOTE — Progress Notes (Signed)
[place] OFFICE PROGRESS NOTE  Patient Care Team: Dion Body, MD as PCP - General (Family Medicine) Jannet Mantis, MD (Dermatology) Bary Castilla [place] Gleason, MD (General Surgery) Hollice Espy, MD as Consulting Physician (Urology)  No matching staging information was found for the patient.   Oncology History   # MAY- June 2017- METASTATIC CLEAR CELL; LEFT RENAL CA/STAGE IV; Furhman- G-3; INTERMEDIATE RISK;  [bil Pul lung nodules;incidental s/p Bx Dr.Byrnett/Dr.Oaks] s/p Cytoreductive nephrectomy; Dr.Brandon- [id number]; July 11th 2017 CT- Enlarging Lung nodules  # Aug 7th 2017- PAZOPANIB; STOP SEP 2017 [pancreatitis/poor tol]  # OCT 1 week- START NIVO q 2W x4; DEC 8th CT- "Progression"; Continue Opdivo;APRIL-MAY 2018- STABLE LUNG NODULES [stopped sec to severe cough; NO Pneumonitis]  # May 31st 2018- [place] 60mg /day; July 31st CT lung- improved lung nodules; Sep 1st week- cabo- 40mg /day [dose reduced sec to mult intol]; Oct 1st 2week-Cabo 20mg /day;   # NOV 15th 2018- CT chest- slight progression of lung nodules [poor tol to higer doses];   # Nov 28th 2018- SUTENT 50 mg 2w-on  &1 w-OFF  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    Patient denies any rash on palms and soles.  Denies any significant fatigue.  The patient continues to have cough.non-productive.  No hemoptysis.  Continues to have shortness of breath on exertion using oxygen only as needed. Currently on inhalers. No nausea no vomiting no headaches.  Appetite is fair.  He denies any pain on palms and soles.  REVIEW OF  SYSTEMS:  A complete 10 point review of system is done which is negative except mentioned above/history of present illness.   PAST MEDICAL HISTORY :  Past Medical History:  Diagnosis Date  . CAD (coronary artery disease)   . Cancer ([place])    left arm  . COPD (chronic obstructive pulmonary disease) ([place])   . Hypertension   . Kidney stone   . Lung cancer ([place])   . Melanoma ([place]) 01/24/2016   right shoulder  . Thyroid nodule 02/02/2016   left BENIGN THYROID NODULE by FNA    PAST SURGICAL HISTORY :   Past Surgical History:  Procedure Laterality Date  . arm surgery Left    arm  . cardiac stents  2011   Angioplasty / Stenting Femoral-X2  . COLONOSCOPY  04/01/12  . CORONARY ANGIOPLASTY    . EXCISION MELANOMA WITH SENTINEL LYMPH NODE BIOPSY Right 01/24/2016   Procedure: EXCISION MELANOMA Right Shoulder;  Surgeon: Robert Bellow, MD;  Location: [facility] ORS;  Service: General;  Laterality: Right;  . LAPAROSCOPIC NEPHRECTOMY, HAND ASSISTED Left 04/24/2016   Procedure: HAND ASSISTED LAPAROSCOPIC NEPHRECTOMY;  Surgeon: Hollice Espy, MD;  Location: [facility] ORS;  Service: Urology;  Laterality: Left;  Marland Kitchen VIDEO ASSISTED THORACOSCOPY (VATS)/THOROCOTOMY Left 03/08/2016   Procedure: VIDEO ASSISTED THORACOSCOPY (VATS) with lung biopsy - Left ;  Surgeon: Robert Bellow, MD;  Location: [facility] ORS;  Service: General;  Laterality: Left;    FAMILY HISTORY :   Family History  Problem Relation Age of Onset  . Hodgkin's lymphoma Daughter 5  .  Kidney cancer Neg Hx   . Kidney disease Neg Hx   . Prostate cancer Neg Hx     SOCIAL HISTORY:   Social History   Tobacco Use  . Smoking status: Former Smoker    Types: Cigarettes    Start date: 01/18/1956    Last attempt to quit: 01/17/1969    Years since quitting: 48.8  . Smokeless tobacco: Never Used  Substance Use Topics  . Alcohol use: No    Alcohol/week: 0.0 oz    Comment: BEER OCC  . Drug use: No    ALLERGIES:  is allergic to lisinopril and  other.  MEDICATIONS:  Current Outpatient Medications  Medication Sig Dispense Refill  . acetaminophen (TYLENOL) 500 MG tablet Take 500 mg by mouth every 6 (six) hours as needed for moderate pain.    Marland Kitchen albuterol (PROVENTIL HFA;VENTOLIN HFA) 108 (90 Base) MCG/ACT inhaler Inhale 2 puffs into the lungs every 6 (six) hours as needed for wheezing or shortness of breath. 1 Inhaler 2  . amLODipine (NORVASC) 5 MG tablet Take 1 tablet (5 mg total) by mouth 2 (two) times daily. 60 tablet 0  . aspirin EC 81 MG tablet Take 81 mg by mouth daily.    Marland Kitchen atorvastatin (LIPITOR) 40 MG tablet Take 40 mg by mouth daily.    . busPIRone (BUSPAR) 5 MG tablet Take 2 tablets by mouth daily.    . chlorpheniramine-HYDROcodone (TUSSIONEX) 10-8 MG/5ML SUER Take 5 mLs by mouth at bedtime as needed for cough. 140 mL 0  . clopidogrel (PLAVIX) 75 MG tablet Take 75 mg by mouth daily.    Marland Kitchen docusate sodium (COLACE) 100 MG capsule Take 1 capsule (100 mg total) by mouth 2 (two) times daily. 60 capsule 3  . DULoxetine (CYMBALTA) 30 MG capsule Take 2 capsules by mouth daily.    . Fluticasone-Salmeterol (ADVAIR DISKUS) 500-50 MCG/DOSE AEPB Inhale 1 puff into the lungs 2 (two) times daily. 60 each 11  . ipratropium-albuterol (DUONEB) 0.5-2.5 (3) MG/3ML SOLN Take 3 mLs by nebulization every 4 (four) hours as needed. 360 mL 3  . levofloxacin (LEVAQUIN) 500 MG tablet Take 1 tablet (500 mg total) daily by mouth. 7 tablet 0  . loratadine (CLARITIN) 10 MG tablet Take 10 mg by mouth daily.    Marland Kitchen losartan (COZAAR) 50 MG tablet Take 50 mg by mouth every morning.     . magic mouthwash w/lidocaine SOLN Take 5 mLs by mouth 4 (four) times daily. 480 mL 3  . MELATONIN PO Take 10 mg by mouth at bedtime.     . metoprolol succinate (TOPROL-XL) 50 MG 24 hr tablet Take 50 mg by mouth every morning.     . nitroGLYCERIN (NITROSTAT) 0.4 MG SL tablet Place 0.4 mg under the tongue every 5 (five) minutes as needed.     . OXYGEN Inhale 3 L into the lungs  continuous. bedtime    . SUNItinib (SUTENT) 50 MG capsule Take 1 capsule (50 mg total) daily by mouth. Take for 2 weeks ON, then 1 week OFF. Do NOT start until okay by MD. 14 capsule 4  . tamsulosin (FLOMAX) 0.4 MG CAPS capsule Take 0.4 mg by mouth every morning.     . Tiotropium Bromide Monohydrate (SPIRIVA RESPIMAT) 1.25 MCG/ACT AERS Inhale 1.25 Act into the lungs 2 (two) times daily. 1 Inhaler 11  . traMADol (ULTRAM) 50 MG tablet Take 1 tablet (50 mg total) by mouth every 6 (six) hours as needed. 40 tablet 0  .  triamcinolone ointment (KENALOG) 0.5 % Apply 1 application topically 2 (two) times daily. 30 g 0   No current facility-administered medications for this visit.     PHYSICAL EXAMINATION: ECOG PERFORMANCE STATUS: 0 - Asymptomatic  BP (!) 163/82 (BP Location: Right Arm, Patient Position: Sitting)   Pulse (!) 59   Temp 98.1 F (36.7 C) (Oral)   Resp 12   Ht 6\' 2"  (1.88 m)   Wt 208 lb (94.3 kg)   BMI 26.71 kg/m   Filed Weights   10/25/17 1352  Weight: 208 lb (94.3 kg)    GENERAL: Well-nourished well-developed; Alert, no distress and comfortable. Accompanied by his wife.  EYES: no pallor or icterus OROPHARYNX: no thrush or ulceration; NECK: supple, no masses felt LYMPH:  no palpable lymphadenopathy in the cervical, axillary or inguinal regions LUNGS: Decreased breath sounds to auscultation at the bases.  No wheeze or crackles HEART/CVS: regular rate & rhythm and no murmurs; No lower extremity edema ABDOMEN:abdomen soft, non-tender and normal bowel sounds Musculoskeletal:no cyanosis of digits and no clubbing  PSYCH: alert & oriented x 3 with fluent speech NEURO: no focal motor/sensory deficits SKIN:  Skin rash resolved.  LABORATORY DATA:  I have reviewed the data as listed    Component Value Date/Time   NA 135 10/25/2017 1318   NA 146 (H) 05/25/2016 1514   K 4.0 10/25/2017 1318   CL 99 (L) 10/25/2017 1318   CO2 27 10/25/2017 1318   GLUCOSE 105 (H) 10/25/2017 1318    BUN 12 10/25/2017 1318   BUN 19 05/25/2016 1514   CREATININE 1.36 (H) 10/25/2017 1318   CALCIUM 9.2 10/25/2017 1318   PROT 6.8 10/25/2017 1318   ALBUMIN 3.9 10/25/2017 1318   AST 27 10/25/2017 1318   ALT 20 10/25/2017 1318   ALKPHOS 86 10/25/2017 1318   BILITOT 0.9 10/25/2017 1318   GFRNONAA 49 (L) 10/25/2017 1318   GFRAA 57 (L) 10/25/2017 1318    No results found for: SPEP, UPEP  Lab Results  Component Value Date   WBC 7.1 10/25/2017   NEUTROABS 4.9 10/25/2017   HGB 15.9 10/25/2017   HCT 45.5 10/25/2017   MCV 94.9 10/25/2017   PLT 154 10/25/2017      Chemistry      Component Value Date/Time   NA 135 10/25/2017 1318   NA 146 (H) 05/25/2016 1514   K 4.0 10/25/2017 1318   CL 99 (L) 10/25/2017 1318   CO2 27 10/25/2017 1318   BUN 12 10/25/2017 1318   BUN 19 05/25/2016 1514   CREATININE 1.36 (H) 10/25/2017 1318      Component Value Date/Time   CALCIUM 9.2 10/25/2017 1318   ALKPHOS 86 10/25/2017 1318   AST 27 10/25/2017 1318   ALT 20 10/25/2017 1318   BILITOT 0.9 10/25/2017 1318     RADIOGRAPHIC STUDIES: I have personally reviewed the radiological images as listed and agreed with the findings in the report. No results found.  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   # currently on sutent [since Nov 28th] 50 mg 2 weeks on 1 week off.  Patient tolerating treatment fairly well so far without any major side effects.  He will plan to restart the next cycle on December 19.  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  Repeat thyroid profile in 1  month.  # cough- ? Etiology not worse; monitor.  Continue inhalers.  # Elevated Blood pressure- 160s/80s-question secondary to Sutent.  Recommend checking blood pressures on a daily basis at home/keep a log; and call us if elevated consistently above  140.  # follow up in 2nd week of jan 2018/labs.  Discussed with the patient's daughter over the phone.  Orders Placed This Encounter  Procedures  . CBC with Differential    Standing Status:   Future    Standing Expiration Date:   10/25/2018  . Comprehensive metabolic panel    Standing Status:   Future    Standing Expiration Date:   10/25/2018  . Thyroid Panel With TSH    Standing Status:   Future    Standing Expiration Date:   10/25/2018      Cammie Sickle, MD 10/25/2017 2:50 PM

## 2017-10-25 NOTE — Assessment & Plan Note (Addendum)
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   # currently on sutent [since Nov 28th] 50 mg 2 weeks on 1 week off.  Patient tolerating treatment fairly well so far without any major side effects.  He will plan to restart the next cycle on December 19.  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  Repeat thyroid profile in 1 month.  # cough- ? Etiology not worse; monitor.  Continue inhalers.  #Hand-foot syndrome prophylaxis recommend urea cream twice a day; and also Kenalog ointment as needed for rash. New script sent.   # Elevated Blood pressure- 160s/80s-question secondary to Sutent.  Recommend checking blood pressures on a daily basis at home/keep a log; and call us if elevated consistently above 140.  # follow up in 2nd week of jan 2018/labs.  Discussed with the patient's daughter over the phone.

## 2017-10-25 NOTE — Patient Instructions (Signed)
#   Use Urea cream/ bare 40 every 12 hours.  # Use steroids cream/ kenalog ointment as needed when palms/soles.

## 2017-10-25 NOTE — Progress Notes (Signed)
Patient here for follow up he has been diagnosed with pneumonia since his last appt.

## 2017-10-30 DIAGNOSIS — I2510 Atherosclerotic heart disease of native coronary artery without angina pectoris: Secondary | ICD-10-CM | POA: Diagnosis not present

## 2017-10-30 DIAGNOSIS — E781 Pure hyperglyceridemia: Secondary | ICD-10-CM | POA: Diagnosis not present

## 2017-10-30 DIAGNOSIS — C7800 Secondary malignant neoplasm of unspecified lung: Secondary | ICD-10-CM | POA: Diagnosis not present

## 2017-10-30 DIAGNOSIS — I10 Essential (primary) hypertension: Secondary | ICD-10-CM | POA: Diagnosis not present

## 2017-10-30 DIAGNOSIS — C642 Malignant neoplasm of left kidney, except renal pelvis: Secondary | ICD-10-CM | POA: Diagnosis not present

## 2017-11-01 MED FILL — SUTENT 50 MG CAPS: 50 | 14 days supply | Qty: 14 | Fill #1

## 2017-11-05 ENCOUNTER — Telehealth (INDEPENDENT_AMBULATORY_CARE_PROVIDER_SITE_OTHER): Payer: Self-pay | Admitting: Orthopaedic Surgery

## 2017-11-05 NOTE — Telephone Encounter (Signed)
Patient is scheduled on 12/20 for knee inj

## 2017-11-05 NOTE — Telephone Encounter (Signed)
Yes, he is approved. He may make an appointment for his injection whenever [facility]/Gil are available

## 2017-11-05 NOTE — Telephone Encounter (Signed)
Patients wife called stating that Artis Delay was supposed to be checking with insurance to see if they were authorized for an injection. Can you check on this? She said they have been waiting for a call back. Please advise  # [phone]

## 2017-11-07 ENCOUNTER — Ambulatory Visit (INDEPENDENT_AMBULATORY_CARE_PROVIDER_SITE_OTHER): Payer: PPO | Admitting: Physician Assistant

## 2017-11-19 DIAGNOSIS — J449 Chronic obstructive pulmonary disease, unspecified: Secondary | ICD-10-CM | POA: Diagnosis not present

## 2017-11-19 DIAGNOSIS — R0602 Shortness of breath: Secondary | ICD-10-CM | POA: Diagnosis not present

## 2017-11-19 DIAGNOSIS — R05 Cough: Secondary | ICD-10-CM | POA: Diagnosis not present

## 2017-11-19 DIAGNOSIS — C642 Malignant neoplasm of left kidney, except renal pelvis: Secondary | ICD-10-CM | POA: Diagnosis not present

## 2017-11-22 ENCOUNTER — Encounter: Payer: Self-pay | Admitting: Internal Medicine

## 2017-11-22 ENCOUNTER — Inpatient Hospital Stay (HOSPITAL_BASED_OUTPATIENT_CLINIC_OR_DEPARTMENT_OTHER): Payer: PPO | Admitting: Internal Medicine

## 2017-11-22 ENCOUNTER — Inpatient Hospital Stay: Payer: PPO | Attending: Internal Medicine

## 2017-11-22 VITALS — BP 132/85 | HR 59 | Temp 97.8°F | Resp 16 | Wt 204.4 lb

## 2017-11-22 DIAGNOSIS — Z9221 Personal history of antineoplastic chemotherapy: Secondary | ICD-10-CM | POA: Diagnosis not present

## 2017-11-22 DIAGNOSIS — D696 Thrombocytopenia, unspecified: Secondary | ICD-10-CM

## 2017-11-22 DIAGNOSIS — I2510 Atherosclerotic heart disease of native coronary artery without angina pectoris: Secondary | ICD-10-CM

## 2017-11-22 DIAGNOSIS — Z85820 Personal history of malignant melanoma of skin: Secondary | ICD-10-CM | POA: Insufficient documentation

## 2017-11-22 DIAGNOSIS — Z79899 Other long term (current) drug therapy: Secondary | ICD-10-CM | POA: Diagnosis not present

## 2017-11-22 DIAGNOSIS — C7800 Secondary malignant neoplasm of unspecified lung: Secondary | ICD-10-CM | POA: Diagnosis not present

## 2017-11-22 DIAGNOSIS — Z87442 Personal history of urinary calculi: Secondary | ICD-10-CM

## 2017-11-22 DIAGNOSIS — C642 Malignant neoplasm of left kidney, except renal pelvis: Secondary | ICD-10-CM | POA: Diagnosis not present

## 2017-11-22 DIAGNOSIS — R0602 Shortness of breath: Secondary | ICD-10-CM | POA: Insufficient documentation

## 2017-11-22 DIAGNOSIS — Z87891 Personal history of nicotine dependence: Secondary | ICD-10-CM | POA: Diagnosis not present

## 2017-11-22 DIAGNOSIS — E041 Nontoxic single thyroid nodule: Secondary | ICD-10-CM | POA: Diagnosis not present

## 2017-11-22 DIAGNOSIS — J449 Chronic obstructive pulmonary disease, unspecified: Secondary | ICD-10-CM | POA: Insufficient documentation

## 2017-11-22 DIAGNOSIS — R05 Cough: Secondary | ICD-10-CM

## 2017-11-22 DIAGNOSIS — Z7982 Long term (current) use of aspirin: Secondary | ICD-10-CM | POA: Insufficient documentation

## 2017-11-22 DIAGNOSIS — I10 Essential (primary) hypertension: Secondary | ICD-10-CM | POA: Diagnosis not present

## 2017-11-22 DIAGNOSIS — Z807 Family history of other malignant neoplasms of lymphoid, hematopoietic and related tissues: Secondary | ICD-10-CM

## 2017-11-22 DIAGNOSIS — E039 Hypothyroidism, unspecified: Secondary | ICD-10-CM

## 2017-11-22 LAB — COMPREHENSIVE METABOLIC PANEL
ALBUMIN: 4 g/dL (ref 3.5–5.0)
ALT: 16 U/L — ABNORMAL LOW (ref 17–63)
AST: 24 U/L (ref 15–41)
Alkaline Phosphatase: 90 U/L (ref 38–126)
Anion gap: 10 (ref 5–15)
BILIRUBIN TOTAL: 1.2 mg/dL (ref 0.3–1.2)
BUN: 16 mg/dL (ref 6–20)
CO2: 27 mmol/L (ref 22–32)
Calcium: 9.2 mg/dL (ref 8.9–10.3)
Chloride: 100 mmol/L — ABNORMAL LOW (ref 101–111)
Creatinine, Ser: 1.32 mg/dL — ABNORMAL HIGH (ref 0.61–1.24)
GFR calc Af Amer: 59 mL/min — ABNORMAL LOW (ref >60)
GFR calc non Af Amer: 51 mL/min — ABNORMAL LOW (ref >60)
GLUCOSE: 98 mg/dL (ref 65–99)
POTASSIUM: 4 mmol/L (ref 3.5–5.1)
Sodium: 137 mmol/L (ref 135–145)
TOTAL PROTEIN: 6.7 g/dL (ref 6.5–8.1)

## 2017-11-22 LAB — CBC WITH DIFFERENTIAL/PLATELET
BASOS ABS: 0 10*3/uL (ref 0–0.1)
Basophils Relative: 1 %
Eosinophils Absolute: 0.1 10*3/uL (ref 0–0.7)
Eosinophils Relative: 1 %
HEMATOCRIT: 42.1 % (ref 40.0–52.0)
Hemoglobin: 14.7 g/dL (ref 13.0–18.0)
LYMPHS PCT: 33 %
Lymphs Abs: 1.6 10*3/uL (ref 1.0–3.6)
MCH: 34.5 pg — AB (ref 26.0–34.0)
MCHC: 34.9 g/dL (ref 32.0–36.0)
MCV: 98.9 fL (ref 80.0–100.0)
MONO ABS: 0.3 10*3/uL (ref 0.2–1.0)
Monocytes Relative: 7 %
NEUTROS ABS: 2.9 10*3/uL (ref 1.4–6.5)
Neutrophils Relative %: 58 %
Platelets: 119 10*3/uL — ABNORMAL LOW (ref 150–440)
RBC: 4.26 MIL/uL — AB (ref 4.40–5.90)
RDW: 17.8 % — ABNORMAL HIGH (ref 11.5–14.5)
WBC: 4.9 10*3/uL (ref 3.8–10.6)

## 2017-11-22 NOTE — Progress Notes (Signed)
[place] OFFICE PROGRESS NOTE  Patient Care Team: Dion Body, MD as PCP - General (Family Medicine) Jannet Mantis, MD (Dermatology) Bary Castilla [place] Gleason, MD (General Surgery) Hollice Espy, MD as Consulting Physician (Urology)  No matching staging information was found for the patient.   Oncology History   # MAY- June 2017- METASTATIC CLEAR CELL; LEFT RENAL CA/STAGE IV; Furhman- G-3; INTERMEDIATE RISK;  [bil Pul lung nodules;incidental s/p Bx Dr.Byrnett/Dr.Oaks] s/p Cytoreductive nephrectomy; Dr.Brandon- [id number]; July 11th 2017 CT- Enlarging Lung nodules  # Aug 7th 2017- PAZOPANIB; STOP SEP 2017 [pancreatitis/poor tol]  # OCT 1 week- START NIVO q 2W x4; DEC 8th CT- "Progression"; Continue Opdivo;APRIL-MAY 2018- STABLE LUNG NODULES [stopped sec to severe cough; NO Pneumonitis]  # May 31st 2018- [place] 60mg /day; July 31st CT lung- improved lung nodules; Sep 1st week- cabo- 40mg /day [dose reduced sec to mult intol]; Oct 1st 2week-[place] 20mg /day;   # NOV 15th 2018- CT chest- slight progression of lung nodules [poor tol to higer doses];   # Nov 28th 2018- SUTENT 50 mg 2w-on  &1 w-OFF  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    Patient denies any significant fatigue.  Denies any rash on palms and soles or diarrhea.  He continues to have intermittent chronic/nonproductive cough.  No hemoptysis.  Continues to have shortness of breath on exertion using oxygen only as needed. Currently on inhalers. No nausea no vomiting no headaches.  Appetite is fair.  Notes to have a good  vacation.  REVIEW OF SYSTEMS:  A complete 10 point review of system is done which is negative except mentioned above/history of present illness.   PAST MEDICAL HISTORY :  Past Medical History:  Diagnosis Date  . CAD (coronary artery disease)   . Cancer ([place])    left arm  . COPD (chronic obstructive pulmonary disease) ([place])   . Hypertension   . Kidney stone   . Lung cancer ([place])   . Melanoma ([place]) 01/24/2016   right shoulder  . Thyroid nodule 02/02/2016   left BENIGN THYROID NODULE by FNA    PAST SURGICAL HISTORY :   Past Surgical History:  Procedure Laterality Date  . arm surgery Left    arm  . cardiac stents  2011   Angioplasty / Stenting Femoral-X2  . COLONOSCOPY  04/01/12  . CORONARY ANGIOPLASTY    . EXCISION MELANOMA WITH SENTINEL LYMPH NODE BIOPSY Right 01/24/2016   Procedure: EXCISION MELANOMA Right Shoulder;  Surgeon: Robert Bellow, MD;  Location: [facility] ORS;  Service: General;  Laterality: Right;  . LAPAROSCOPIC NEPHRECTOMY, HAND ASSISTED Left 04/24/2016   Procedure: HAND ASSISTED LAPAROSCOPIC NEPHRECTOMY;  Surgeon: Hollice Espy, MD;  Location: [facility] ORS;  Service: Urology;  Laterality: Left;  Marland Kitchen VIDEO ASSISTED THORACOSCOPY (VATS)/THOROCOTOMY Left 03/08/2016   Procedure: VIDEO ASSISTED THORACOSCOPY (VATS) with lung biopsy - Left ;  Surgeon: Robert Bellow, MD;  Location: [facility] ORS;  Service: General;  Laterality: Left;    FAMILY HISTORY :   Family History  Problem Relation Age of Onset  . Hodgkin's lymphoma Daughter 5  .  Kidney cancer Neg Hx   . Kidney disease Neg Hx   . Prostate cancer Neg Hx     SOCIAL HISTORY:   Social History   Tobacco Use  . Smoking status: Former Smoker    Types: Cigarettes    Start date: 01/18/1956    Last attempt to quit: 01/17/1969    Years since quitting: 48.8  . Smokeless tobacco: Never Used  Substance Use Topics  . Alcohol use: No    Alcohol/week: 0.0 oz    Comment: BEER OCC  . Drug use: No    ALLERGIES:  is allergic to  lisinopril and other.  MEDICATIONS:  Current Outpatient Medications  Medication Sig Dispense Refill  . acetaminophen (TYLENOL) 500 MG tablet Take 500 mg by mouth every 6 (six) hours as needed for moderate pain.    Marland Kitchen albuterol (PROVENTIL HFA;VENTOLIN HFA) 108 (90 Base) MCG/ACT inhaler Inhale 2 puffs into the lungs every 6 (six) hours as needed for wheezing or shortness of breath. 1 Inhaler 2  . amLODipine (NORVASC) 5 MG tablet Take 1 tablet (5 mg total) by mouth 2 (two) times daily. 60 tablet 0  . aspirin EC 81 MG tablet Take 81 mg by mouth daily.    Marland Kitchen atorvastatin (LIPITOR) 40 MG tablet Take 40 mg by mouth daily.    . busPIRone (BUSPAR) 5 MG tablet Take 2 tablets by mouth daily.    . clopidogrel (PLAVIX) 75 MG tablet Take 75 mg by mouth daily.    Marland Kitchen docusate sodium (COLACE) 100 MG capsule Take 1 capsule (100 mg total) by mouth 2 (two) times daily. 60 capsule 3  . DULoxetine (CYMBALTA) 30 MG capsule Take 2 capsules by mouth daily.    . Fluticasone-Salmeterol (ADVAIR DISKUS) 500-50 MCG/DOSE AEPB Inhale 1 puff into the lungs 2 (two) times daily. 60 each 11  . ipratropium-albuterol (DUONEB) 0.5-2.5 (3) MG/3ML SOLN Take 3 mLs by nebulization every 4 (four) hours as needed. 360 mL 3  . loratadine (CLARITIN) 10 MG tablet Take 10 mg by mouth daily.    Marland Kitchen losartan (COZAAR) 50 MG tablet Take 50 mg by mouth every morning.     . magic mouthwash w/lidocaine SOLN Take 5 mLs by mouth 4 (four) times daily. 480 mL 3  . MELATONIN PO Take 10 mg by mouth at bedtime.     . metoprolol succinate (TOPROL-XL) 50 MG 24 hr tablet Take 50 mg by mouth every morning.     . nitroGLYCERIN (NITROSTAT) 0.4 MG SL tablet Place 0.4 mg under the tongue every 5 (five) minutes as needed.     . OXYGEN Inhale 3 L into the lungs continuous. bedtime    . SUNItinib (SUTENT) 50 MG capsule Take 1 capsule (50 mg total) daily by mouth. Take for 2 weeks ON, then 1 week OFF. Do NOT start until okay by MD. 14 capsule 4  . tamsulosin (FLOMAX) 0.4  MG CAPS capsule Take 0.4 mg by mouth every morning.     . Tiotropium Bromide Monohydrate (SPIRIVA RESPIMAT) 1.25 MCG/ACT AERS Inhale 1.25 Act into the lungs 2 (two) times daily. 1 Inhaler 11  . traMADol (ULTRAM) 50 MG tablet Take 1 tablet (50 mg total) by mouth every 6 (six) hours as needed. 40 tablet 0  . triamcinolone ointment (KENALOG) 0.5 % Apply 1 application topically 2 (two) times daily. 30 g 0  . levofloxacin (LEVAQUIN) 500 MG tablet Take 1 tablet (500 mg total) daily by mouth. 7 tablet 0   No current facility-administered  medications for this visit.     PHYSICAL EXAMINATION: ECOG PERFORMANCE STATUS: 0 - Asymptomatic  BP 132/85 (BP Location: Right Arm, Patient Position: Sitting)   Pulse (!) 59   Temp 97.8 F (36.6 C) (Tympanic)   Resp 16   Wt 204 lb 6.4 oz (92.7 kg)   BMI 26.24 kg/m   Filed Weights   11/22/17 1453  Weight: 204 lb 6.4 oz (92.7 kg)    GENERAL: Well-nourished well-developed; Alert, no distress and comfortable.  He is alone.   EYES: no pallor or icterus OROPHARYNX: no thrush or ulceration; NECK: supple, no masses felt LYMPH:  no palpable lymphadenopathy in the cervical, axillary or inguinal regions LUNGS: Decreased breath sounds to auscultation at the bases.  No wheeze or crackles HEART/CVS: regular rate & rhythm and no murmurs; No lower extremity edema ABDOMEN:abdomen soft, non-tender and normal bowel sounds Musculoskeletal:no cyanosis of digits and no clubbing  PSYCH: alert & oriented x 3 with fluent speech NEURO: no focal motor/sensory deficits SKIN:  Skin rash resolved.  LABORATORY DATA:  I have reviewed the data as listed    Component Value Date/Time   NA 137 11/22/2017 1418   NA 146 (H) 05/25/2016 1514   K 4.0 11/22/2017 1418   CL 100 (L) 11/22/2017 1418   CO2 27 11/22/2017 1418   GLUCOSE 98 11/22/2017 1418   BUN 16 11/22/2017 1418   BUN 19 05/25/2016 1514   CREATININE 1.32 (H) 11/22/2017 1418   CALCIUM 9.2 11/22/2017 1418   PROT 6.7  11/22/2017 1418   ALBUMIN 4.0 11/22/2017 1418   AST 24 11/22/2017 1418   ALT 16 (L) 11/22/2017 1418   ALKPHOS 90 11/22/2017 1418   BILITOT 1.2 11/22/2017 1418   GFRNONAA 51 (L) 11/22/2017 1418   GFRAA 59 (L) 11/22/2017 1418    No results found for: SPEP, UPEP  Lab Results  Component Value Date   WBC 4.9 11/22/2017   NEUTROABS 2.9 11/22/2017   HGB 14.7 11/22/2017   HCT 42.1 11/22/2017   MCV 98.9 11/22/2017   PLT 119 (L) 11/22/2017      Chemistry      Component Value Date/Time   NA 137 11/22/2017 1418   NA 146 (H) 05/25/2016 1514   K 4.0 11/22/2017 1418   CL 100 (L) 11/22/2017 1418   CO2 27 11/22/2017 1418   BUN 16 11/22/2017 1418   BUN 19 05/25/2016 1514   CREATININE 1.32 (H) 11/22/2017 1418      Component Value Date/Time   CALCIUM 9.2 11/22/2017 1418   ALKPHOS 90 11/22/2017 1418   AST 24 11/22/2017 1418   ALT 16 (L) 11/22/2017 1418   BILITOT 1.2 11/22/2017 1418     RADIOGRAPHIC STUDIES: I have personally reviewed the radiological images as listed and agreed with the findings in the report. No results found.  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   # currently on sutent [since Nov 28th] 50 mg 2 weeks on 1 week off.  Patient tolerating treatment fairly well so far without any major side effects except for mild thrombocytopenia  #Mild thrombocytopenia platelets 119-monitor for now asymptomatic.  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  Normal.  # cough- ? Etiology not worse; monitor.  Continue inhalers.  #Hand-foot syndrome none; continue prophylaxis as discussed.  # Elevated Blood pressure- in 130s/80s-question  secondary to Sutent.    # follow up in 4 weeks;/labs will order scan at next week.   No orders of the defined types were placed in this encounter.     Cammie Sickle, MD 11/26/2017 1:33 PM

## 2017-11-22 NOTE — Assessment & Plan Note (Addendum)
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   # currently on sutent [since Nov 28th] 50 mg 2 weeks on 1 week off.  Patient tolerating treatment fairly well so far without any major side effects except for mild thrombocytopenia  #Mild thrombocytopenia platelets 119-monitor for now asymptomatic.  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  Normal.  # cough- ? Etiology not worse; monitor.  Continue inhalers.  #Hand-foot syndrome none; continue prophylaxis as discussed.  # Elevated Blood pressure- in 130s/80s-question secondary to Sutent.    # follow up in 4 weeks;/labs will order scan at next week.

## 2017-11-23 LAB — THYROID PANEL WITH TSH
Free Thyroxine Index: 1.4 (ref 1.2–4.9)
T3 Uptake Ratio: 27 % (ref 24–39)
T4, Total: 5 ug/dL (ref 4.5–12.0)
TSH: 3.55 u[IU]/mL (ref 0.450–4.500)

## 2017-11-25 ENCOUNTER — Telehealth: Payer: Self-pay | Admitting: Internal Medicine

## 2017-11-25 DIAGNOSIS — I10 Essential (primary) hypertension: Secondary | ICD-10-CM | POA: Diagnosis not present

## 2017-11-25 DIAGNOSIS — F330 Major depressive disorder, recurrent, mild: Secondary | ICD-10-CM | POA: Diagnosis not present

## 2017-11-25 DIAGNOSIS — F418 Other specified anxiety disorders: Secondary | ICD-10-CM | POA: Diagnosis not present

## 2017-11-25 DIAGNOSIS — E781 Pure hyperglyceridemia: Secondary | ICD-10-CM | POA: Diagnosis not present

## 2017-11-25 MED FILL — SUTENT 50 MG CAPS: 50 | 14 days supply | Qty: 14 | Fill #2

## 2017-11-25 NOTE — Telephone Encounter (Addendum)
Oral Oncology Patient Advocate Encounter  Was successful in securing patient an $ $5,900.00 grant from [employer] ([employer]) to provide copayment coverage for his Sutent.  This will keep the out of pocket expense at $0.    I have spoken with the patient.    The billing information is as follows and has been shared with [place].   Member ID: [id number] Group ID: [id number] RxBin: [id number] Dates of Eligibility: 08/27/2017 through 11/24/2018   [place] Patient Advocate [phone] 11/25/2017 3:13 PM    Oral Oncology Patient Advocate Encounter  Was successful in securing patient a year grant from [place] to provide copayment coverage for his Sutent.  This will keep the out of pocket expense at $0.    I have spoken with the patient.    The billing information is as follows and has been shared with [place].   Member ID: [id number] Group ID: [id number] RxBin: [id number] Dates of Eligibility: 11/25/2017 through 11/25/2018  [place] Patient Advocate [phone] 11/25/2017 4:04 PM

## 2017-11-26 DIAGNOSIS — Z85820 Personal history of malignant melanoma of skin: Secondary | ICD-10-CM | POA: Diagnosis not present

## 2017-11-26 DIAGNOSIS — L570 Actinic keratosis: Secondary | ICD-10-CM | POA: Diagnosis not present

## 2017-11-26 DIAGNOSIS — L578 Other skin changes due to chronic exposure to nonionizing radiation: Secondary | ICD-10-CM | POA: Diagnosis not present

## 2017-11-26 DIAGNOSIS — D485 Neoplasm of uncertain behavior of skin: Secondary | ICD-10-CM | POA: Diagnosis not present

## 2017-11-26 DIAGNOSIS — Z85828 Personal history of other malignant neoplasm of skin: Secondary | ICD-10-CM | POA: Diagnosis not present

## 2017-11-26 DIAGNOSIS — L905 Scar conditions and fibrosis of skin: Secondary | ICD-10-CM | POA: Diagnosis not present

## 2017-11-26 DIAGNOSIS — L720 Epidermal cyst: Secondary | ICD-10-CM | POA: Diagnosis not present

## 2017-11-26 DIAGNOSIS — Z859 Personal history of malignant neoplasm, unspecified: Secondary | ICD-10-CM | POA: Diagnosis not present

## 2017-11-26 DIAGNOSIS — Z872 Personal history of diseases of the skin and subcutaneous tissue: Secondary | ICD-10-CM | POA: Diagnosis not present

## 2017-11-26 DIAGNOSIS — B078 Other viral warts: Secondary | ICD-10-CM | POA: Diagnosis not present

## 2017-12-02 ENCOUNTER — Ambulatory Visit (INDEPENDENT_AMBULATORY_CARE_PROVIDER_SITE_OTHER): Payer: PPO | Admitting: Physician Assistant

## 2017-12-02 ENCOUNTER — Ambulatory Visit: Payer: PPO | Admitting: Internal Medicine

## 2017-12-02 ENCOUNTER — Encounter (INDEPENDENT_AMBULATORY_CARE_PROVIDER_SITE_OTHER): Payer: Self-pay | Admitting: Physician Assistant

## 2017-12-02 VITALS — Ht 73.0 in | Wt 207.0 lb

## 2017-12-02 DIAGNOSIS — M1712 Unilateral primary osteoarthritis, left knee: Secondary | ICD-10-CM | POA: Diagnosis not present

## 2017-12-02 MED ORDER — HYALURONAN 88 MG/4ML IX SOSY
88.0000 mg | PREFILLED_SYRINGE | INTRA_ARTICULAR | Status: AC | PRN
  Administered 2017-12-02: 88 mg via INTRA_ARTICULAR

## 2017-12-02 NOTE — Progress Notes (Signed)
   Procedure Note  Patient: Glen Davis             Date of Birth: [birth year]           MRN: [id number]             Visit Date: 12/02/2017 HPI : Glen Davis 77 year old male with known osteoarthritis of his left knee.  He has near bone-on-bone medial compartment and moderate patellofemoral arthritic changes of the left knee.  He states that the cortisone injection really did not help.  He is here for Monovisc injection. Procedures: Visit Diagnoses: Primary osteoarthritis of left knee  Large Joint Inj: L knee on 12/02/2017 2:26 PM Indications: pain Details: 22 G 1.5 in needle, anterolateral approach  Arthrogram: No  Medications: 88 mg Hyaluronan 88 MG/4ML Outcome: tolerated well, no immediate complications Procedure, treatment alternatives, risks and benefits explained, specific risks discussed. Consent was given by the patient. Immediately prior to procedure a time out was called to verify the correct patient, procedure, equipment, support staff and site/side marked as required. Patient was prepped and draped in the usual sterile fashion.    Plan: He will follow-up with [place] in 8 weeks to check his progress lack of.  Questions were encouraged and answered at length.  I did discuss with him that this may take 4-6 weeks to really kick in and start helping with his knee pain.  He understands that he can only have injections every 6 months as far as Monovisc and cortisone every 3.

## 2017-12-07 ENCOUNTER — Other Ambulatory Visit: Payer: Self-pay

## 2017-12-07 ENCOUNTER — Emergency Department: Payer: PPO

## 2017-12-07 ENCOUNTER — Emergency Department
Admission: EM | Admit: 2017-12-07 | Discharge: 2017-12-07 | Disposition: A | Discharge status: Home / Self Care | Payer: PPO | Attending: Emergency Medicine | Admitting: Emergency Medicine

## 2017-12-07 DIAGNOSIS — I2510 Atherosclerotic heart disease of native coronary artery without angina pectoris: Secondary | ICD-10-CM | POA: Diagnosis not present

## 2017-12-07 DIAGNOSIS — S51011A Laceration without foreign body of right elbow, initial encounter: Secondary | ICD-10-CM | POA: Insufficient documentation

## 2017-12-07 DIAGNOSIS — I10 Essential (primary) hypertension: Secondary | ICD-10-CM | POA: Insufficient documentation

## 2017-12-07 DIAGNOSIS — R51 Headache: Secondary | ICD-10-CM | POA: Diagnosis not present

## 2017-12-07 DIAGNOSIS — S060X1A Concussion with loss of consciousness of 30 minutes or less, initial encounter: Secondary | ICD-10-CM | POA: Diagnosis not present

## 2017-12-07 DIAGNOSIS — D0220 Carcinoma in situ of unspecified bronchus and lung: Secondary | ICD-10-CM | POA: Diagnosis not present

## 2017-12-07 DIAGNOSIS — W08XXXA Fall from other furniture, initial encounter: Secondary | ICD-10-CM | POA: Diagnosis not present

## 2017-12-07 DIAGNOSIS — Y929 Unspecified place or not applicable: Secondary | ICD-10-CM | POA: Insufficient documentation

## 2017-12-07 DIAGNOSIS — S51019A Laceration without foreign body of unspecified elbow, initial encounter: Secondary | ICD-10-CM

## 2017-12-07 DIAGNOSIS — S199XXA Unspecified injury of neck, initial encounter: Secondary | ICD-10-CM | POA: Diagnosis not present

## 2017-12-07 DIAGNOSIS — Y999 Unspecified external cause status: Secondary | ICD-10-CM | POA: Insufficient documentation

## 2017-12-07 DIAGNOSIS — Z87891 Personal history of nicotine dependence: Secondary | ICD-10-CM | POA: Diagnosis not present

## 2017-12-07 DIAGNOSIS — M542 Cervicalgia: Secondary | ICD-10-CM | POA: Diagnosis not present

## 2017-12-07 DIAGNOSIS — Y93E9 Activity, other interior property and clothing maintenance: Secondary | ICD-10-CM | POA: Diagnosis not present

## 2017-12-07 DIAGNOSIS — Z79899 Other long term (current) drug therapy: Secondary | ICD-10-CM | POA: Insufficient documentation

## 2017-12-07 DIAGNOSIS — J449 Chronic obstructive pulmonary disease, unspecified: Secondary | ICD-10-CM | POA: Insufficient documentation

## 2017-12-07 DIAGNOSIS — W19XXXA Unspecified fall, initial encounter: Secondary | ICD-10-CM

## 2017-12-07 DIAGNOSIS — Z7982 Long term (current) use of aspirin: Secondary | ICD-10-CM | POA: Diagnosis not present

## 2017-12-07 DIAGNOSIS — W2209XA Striking against other stationary object, initial encounter: Secondary | ICD-10-CM | POA: Diagnosis not present

## 2017-12-07 DIAGNOSIS — S0990XA Unspecified injury of head, initial encounter: Secondary | ICD-10-CM | POA: Diagnosis not present

## 2017-12-07 LAB — URINALYSIS, COMPLETE (UACMP) WITH MICROSCOPIC
Bacteria, UA: NONE SEEN
Bilirubin Urine: NEGATIVE
Glucose, UA: NEGATIVE mg/dL
Hgb urine dipstick: NEGATIVE
Ketones, ur: NEGATIVE mg/dL
Leukocytes, UA: NEGATIVE
Nitrite: NEGATIVE
Protein, ur: NEGATIVE mg/dL
Specific Gravity, Urine: 1.011 (ref 1.005–1.030)
Squamous Epithelial / HPF: NONE SEEN
pH: 5 (ref 5.0–8.0)

## 2017-12-07 LAB — BASIC METABOLIC PANEL WITH GFR
Anion gap: 9 (ref 5–15)
BUN: 14 mg/dL (ref 6–20)
CO2: 27 mmol/L (ref 22–32)
Calcium: 9.6 mg/dL (ref 8.9–10.3)
Chloride: 100 mmol/L — ABNORMAL LOW (ref 101–111)
Creatinine, Ser: 1.25 mg/dL — ABNORMAL HIGH (ref 0.61–1.24)
GFR calc Af Amer: >60 mL/min
GFR calc non Af Amer: 54 mL/min — ABNORMAL LOW
Glucose, Bld: 109 mg/dL — ABNORMAL HIGH (ref 65–99)
Potassium: 3.7 mmol/L (ref 3.5–5.1)
Sodium: 136 mmol/L (ref 135–145)

## 2017-12-07 LAB — CBC
HCT: 41.6 % (ref 40.0–52.0)
Hemoglobin: 14.8 g/dL (ref 13.0–18.0)
MCH: 35.9 pg — ABNORMAL HIGH (ref 26.0–34.0)
MCHC: 35.6 g/dL (ref 32.0–36.0)
MCV: 100.9 fL — ABNORMAL HIGH (ref 80.0–100.0)
Platelets: 101 K/uL — ABNORMAL LOW (ref 150–440)
RBC: 4.12 MIL/uL — ABNORMAL LOW (ref 4.40–5.90)
RDW: 18.6 % — ABNORMAL HIGH (ref 11.5–14.5)
WBC: 4.4 K/uL (ref 3.8–10.6)

## 2017-12-07 MED ORDER — TETANUS-DIPHTH-ACELL PERTUSSIS 5-2.5-18.5 LF-MCG/0.5 IM SUSP
0.5000 mL | Freq: Once | INTRAMUSCULAR | Status: AC
Start: 2017-12-07 — End: 2017-12-07
  Administered 2017-12-07: 0.5 mL via INTRAMUSCULAR
  Filled 2017-12-07: qty 0.5

## 2017-12-07 MED ORDER — TRAMADOL HCL 50 MG PO TABS
50.0000 mg | ORAL_TABLET | ORAL | Status: AC
Start: 2017-12-07 — End: 2017-12-07
  Administered 2017-12-07: 50 mg via ORAL
  Filled 2017-12-07: qty 1

## 2017-12-07 MED ORDER — TRAMADOL HCL 50 MG PO TABS: 50.0000 mg | ORAL_TABLET | Freq: Four times a day (QID) | ORAL | 0 refills | Status: DC | PRN

## 2017-12-07 NOTE — ED Triage Notes (Addendum)
Pt came to ED via pov. Fell and hit head yesterday, pt reports slipped which painting. Happened yesterday at 1500. C/o right shoulder pain, neck pain as well as headaches. On plavix

## 2017-12-07 NOTE — Discharge Instructions (Signed)
No driving while taking tramadol.

## 2017-12-07 NOTE — ED Provider Notes (Signed)
[facility] Emergency Department Provider Note   ____________________________________________   First MD Initiated Contact with Patient 12/07/17 1526     (approximate)  I have reviewed the triage vital signs and the nursing notes.   HISTORY  Chief Complaint Fall    HPI Glen Davis is a 77 y.o. male the history of coronary disease, COPD, lung cancer currently on oral chemo followed by the cancer center here.  Patient was on a stool last night, he was painting when he notes that the stool started to slip out from under him.  He then remembers striking his head on the right side of it against the edge of the wall or door frame, and then he awoke on the floor.  He thinks he may have lost consciousness just very briefly.  Wife reports that he has been complaining of a headache over the right side of the scalp, also some slightly confused thoughts earlier today but that seems to be improving.  He is now alert, seems to be at his normal.  He also reports he had a couple small cuts over his right elbow, and he does take Plavix.  Because of his ongoing headache he went to be evaluated to make sure he did not have any bleeding problems.  Denies pain except for some slight soreness around the skin cuts over his right elbow.  No nausea or vomiting.  His neck felt slightly achy, but no severe pain.  Denies numbness or weakness.  Feels like he is at his normal now, except about a 8 out of 10 throbbing discomfort over the right scalp after the fall.   Past Medical History:  Diagnosis Date  . CAD (coronary artery disease)   . Cancer ([place])    left arm  . COPD (chronic obstructive pulmonary disease) ([place])   . Hypertension   . Kidney stone   . Lung cancer ([place])   . Melanoma ([place]) 01/24/2016   right shoulder  . Thyroid nodule 02/02/2016   left BENIGN THYROID NODULE by FNA    Patient Active Problem List   Diagnosis Date Noted  . Acute sinusitis 02/06/2017  . Cough  10/09/2016  . SOB (shortness of breath) 09/13/2016  . Syncope 08/17/2016  . Situational anxiety 06/05/2016  . 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 Coronary artery disease involving native coronary artery of native heart without angina pectoris 03/16/2016  . Essential (primary) hypertension 03/16/2016  . Hypertriglyceridemia 03/16/2016  . Left thyroid nodule 02/03/2016  . Pulmonary nodules/lesions, multiple 02/03/2016  . Counseling regarding goals of care 01/30/2016  . Vaccine counseling 01/30/2016  . 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 Melanoma of skin ([place]) 01/12/2016  . Breathlessness on exertion 04/25/2015    Past Surgical History:  Procedure Laterality Date  . arm surgery Left    arm  . cardiac stents  2011   Angioplasty / Stenting Femoral-X2  . COLONOSCOPY  04/01/12  . CORONARY ANGIOPLASTY    . EXCISION MELANOMA WITH SENTINEL LYMPH NODE BIOPSY Right 01/24/2016   Procedure: EXCISION MELANOMA Right Shoulder;  Surgeon: Robert Bellow, MD;  Location: [facility] ORS;  Service: General;  Laterality: Right;  . LAPAROSCOPIC NEPHRECTOMY, HAND ASSISTED Left 04/24/2016   Procedure: HAND ASSISTED LAPAROSCOPIC NEPHRECTOMY;  Surgeon: Hollice Espy, MD;  Location: [facility] ORS;  Service: Urology;  Laterality: Left;  Marland Kitchen VIDEO ASSISTED THORACOSCOPY (VATS)/THOROCOTOMY Left 03/08/2016  Procedure: VIDEO ASSISTED THORACOSCOPY (VATS) with lung biopsy - Left ;  Surgeon: Robert Bellow, MD;  Location: [facility] ORS;  Service: General;  Laterality: Left;    Prior to Admission medications   Medication Sig Start Date End Date Taking? Authorizing Provider  acetaminophen (TYLENOL) 500 MG tablet Take 500 mg by mouth every 6 (six) hours as needed for moderate pain.    [provider]  albuterol (PROVENTIL HFA;VENTOLIN HFA) 108 (90 Base) MCG/ACT inhaler Inhale 2 puffs into the lungs every 6 (six) hours as needed for  wheezing or shortness of breath. 02/21/17   Cammie Sickle, MD  amLODipine (NORVASC) 5 MG tablet Take 1 tablet (5 mg total) by mouth 2 (two) times daily. 04/18/17   Cammie Sickle, MD  aspirin EC 81 MG tablet Take 81 mg by mouth daily.    [provider]  atorvastatin (LIPITOR) 40 MG tablet Take 40 mg by mouth daily.    [provider]  busPIRone (BUSPAR) 5 MG tablet Take 2 tablets by mouth daily. 10/08/17 10/08/18  [provider]  clopidogrel (PLAVIX) 75 MG tablet Take 75 mg by mouth daily.    [provider]  docusate sodium (COLACE) 100 MG capsule Take 1 capsule (100 mg total) by mouth 2 (two) times daily. 06/05/17   Hollice Espy, MD  DULoxetine (CYMBALTA) 30 MG capsule Take 2 capsules by mouth daily. 08/05/17 08/05/18  [provider]  Fluticasone-Salmeterol (ADVAIR DISKUS) 500-50 MCG/DOSE AEPB Inhale 1 puff into the lungs 2 (two) times daily. 09/11/17   Flora Lipps, MD  ipratropium-albuterol (DUONEB) 0.5-2.5 (3) MG/3ML SOLN Take 3 mLs by nebulization every 4 (four) hours as needed. 04/04/17   Cammie Sickle, MD  levofloxacin (LEVAQUIN) 500 MG tablet Take 1 tablet (500 mg total) daily by mouth. 10/04/17   Cammie Sickle, MD  loratadine (CLARITIN) 10 MG tablet Take 10 mg by mouth daily.    [provider]  losartan (COZAAR) 50 MG tablet Take 50 mg by mouth every morning.  08/03/15   [provider]  magic mouthwash w/lidocaine SOLN Take 5 mLs by mouth 4 (four) times daily. 07/20/16   Cammie Sickle, MD  MELATONIN PO Take 10 mg by mouth at bedtime.     [provider]  metoprolol succinate (TOPROL-XL) 50 MG 24 hr tablet Take 50 mg by mouth every morning.  10/17/15   [provider]  nitroGLYCERIN (NITROSTAT) 0.4 MG SL tablet Place 0.4 mg under the tongue every 5 (five) minutes as needed.  04/26/15   [provider]  OXYGEN Inhale 3 L into the lungs continuous. bedtime    [provider]  SUNItinib (SUTENT) 50 MG capsule Take 1 capsule (50 mg total) daily by mouth. Take for 2 weeks ON, then 1 week OFF. Do NOT start until okay by MD. 10/04/17   Cammie Sickle, MD  tamsulosin (FLOMAX) 0.4 MG CAPS capsule Take 0.4 mg by mouth every morning.  01/24/15   [provider]  Tiotropium Bromide Monohydrate (SPIRIVA RESPIMAT) 1.25 MCG/ACT AERS Inhale 1.25 Act into the lungs 2 (two) times daily. 09/11/17   Flora Lipps, MD  traMADol (ULTRAM) 50 MG tablet Take 1 tablet (50 mg total) by mouth every 6 (six) hours as needed for moderate pain or severe pain. 12/07/17   Delman Kitten, MD  triamcinolone ointment (KENALOG) 0.5 % Apply 1 application topically 2 (two) times daily. 10/25/17   Cammie Sickle, MD    Allergies  Lisinopril and Other  Family History  Problem Relation Age of Onset  . Hodgkin's lymphoma Daughter 5  . Kidney cancer Neg Hx   . Kidney disease Neg Hx   . Prostate cancer Neg Hx     Social History Social History   Tobacco Use  . Smoking status: Former Smoker    Types: Cigarettes    Start date: 01/18/1956    Last attempt to quit: 01/17/1969    Years since quitting: 48.9  . Smokeless tobacco: Never Used  Substance Use Topics  . Alcohol use: No    Alcohol/week: 0.0 oz    Comment: BEER OCC  . Drug use: No    Review of Systems Constitutional: No fever/chills Eyes: No visual changes. ENT: No sore throat.  Neck slightly achy Cardiovascular: Denies chest pain. Respiratory: Denies shortness of breath. Gastrointestinal: No abdominal pain.  No nausea, no vomiting.  No diarrhea.  No constipation. Genitourinary: Negative for dysuria. Musculoskeletal: Negative for back pain. Skin: Negative for rash. Neurological: Negative for headaches, focal weakness or numbness.  Wife reports he seems slightly confused after the fall and earlier today, seems to be improving.    ____________________________________________   PHYSICAL EXAM:  VITAL  SIGNS: ED Triage Vitals [12/07/17 1331]  Enc Vitals Group     BP (!) 150/92     Pulse Rate (!) 55     Resp 16     Temp 98 F (36.7 C)     Temp src      SpO2 100 %     Weight 207 lb (93.9 kg)     Height 6\' 1"  (1.854 m)     Head Circumference      Peak Flow      Pain Score      Pain Loc      Pain Edu?      Excl. in [place]?     Constitutional: Alert and oriented. Well appearing and in no acute distress.  Well oriented now. Eyes: Conjunctivae are normal. Head: Atraumatic. Nose: No congestion/rhinnorhea. Mouth/Throat: Mucous membranes are moist. Neck: No stridor.   Cardiovascular: Normal rate, regular rhythm. Grossly normal heart sounds.  Good peripheral circulation. Respiratory: Normal respiratory effort.  No retractions. Lungs CTAB. Gastrointestinal: Soft and nontender. No distention. Musculoskeletal:   RIGHT Right upper extremity demonstrates normal strength, good use of all muscles. No edema bruising or contusions of the right shoulder/upper arm, right elbow, right forearm / hand however there are some 2-3's small very superficial skin tears just lateral to the right elbow.  No deep lacerations.  No surrounding erythema, there is some slight bruising.. Full range of motion of the right right upper extremity without pain. No evidence of trauma. Strong radial pulse. Intact median/ulnar/radial neuro-muscular exam.  No bony tenderness, careful examination of the elbow reveals no bony tenderness or limitation to range of motion  LEFT Left upper extremity demonstrates normal strength, good use of all muscles. No edema bruising or contusions of the left shoulder/upper arm, left elbow, left forearm / hand. Full range of motion of the left  upper extremity without pain. No evidence of trauma. Strong radial pulse. Intact median/ulnar/radial neuro-muscular exam.  Lower Extremities   Normal neuro-motor function lower extremities bilateral.  RIGHT Right lower extremity demonstrates normal  strength, good use of all muscles. No edema bruising or contusions of the right hip, right knee, right ankle. Full range of motion of the right lower extremity without pain. No pain on axial loading. No evidence of trauma.  LEFT Left lower extremity demonstrates normal strength, good use of all muscles. No edema bruising or contusions of the hip,  knee, ankle. Full range of motion of the left lower extremity without pain. No pain on axial loading. No evidence of trauma.   Neurologic:  Normal speech and language. No gross focal neurologic deficits are appreciated.  Skin:  Skin is warm, dry and intact. No rash noted. Psychiatric: Mood and affect are normal. Speech and behavior are normal.  ____________________________________________   LABS (all labs ordered are listed, but only abnormal results are displayed)  Labs Reviewed  CBC - Abnormal; Notable for the following components:      Result Value   RBC 4.12 (*)    MCV 100.9 (*)    MCH 35.9 (*)    RDW 18.6 (*)    Platelets 101 (*)    All other components within normal limits  BASIC METABOLIC PANEL - Abnormal; Notable for the following components:   Chloride 100 (*)    Glucose, Bld 109 (*)    Creatinine, Ser 1.25 (*)    GFR calc non Af Amer 54 (*)    All other components within normal limits  URINALYSIS, COMPLETE (UACMP) WITH MICROSCOPIC - Abnormal; Notable for the following components:   Color, Urine YELLOW (*)    APPearance CLEAR (*)    All other components within normal limits   ____________________________________________  EKG  Reviewed and her by me at 1625 Heart rate 50 QRS 100 QTC 410 Sinus bradycardia, no evidence of acute ischemia ____________________________________________  RADIOLOGY   Ct Head Wo Contrast  Result Date: 12/07/2017 CLINICAL DATA:  Fall, head injury, headache and neck pain EXAM: CT HEAD WITHOUT CONTRAST CT CERVICAL SPINE WITHOUT CONTRAST TECHNIQUE: Multidetector CT imaging of the head and  cervical spine was performed following the standard protocol without intravenous contrast. Multiplanar CT image reconstructions of the cervical spine were also generated. COMPARISON:  06/11/2017 FINDINGS: CT HEAD FINDINGS Brain: Stable age related brain atrophy pattern. No acute intracranial hemorrhage, mass lesion, new infarction, midline shift, herniation hydrocephalus, or extra-axial fluid collection. No focal mass effect or edema. Cisterns are patent. Cerebellar atrophy as well. Vascular: No hyperdense vessel or unexpected calcification. Skull: Normal. Negative for fracture or focal lesion. Sinuses/Orbits: Chronic sinus mucosal thickening throughout the sinuses. Right maxillary air-fluid level noted. Difficult to exclude sinusitis. Other: None. CT CERVICAL SPINE FINDINGS Alignment: Normal. Skull base and vertebrae: No acute fracture. No primary bone lesion or focal pathologic process. Soft tissues and spinal canal: No prevertebral soft tissue swelling or edema. No visible canal hematoma. Carotid atherosclerosis noted bilaterally. Disc levels: Moderate multilevel degenerative spondylosis spanning C1-C7. All levels demonstrate disc space narrowing, sclerosis and osteophytes. Multilevel facet arthropathy posteriorly. Degenerative changes of the C1-2 articulation as well. Upper chest: Partially imaged spiculated nodule in the right upper lobe measures 14 mm, image 94 series 7. Emphysema noted. Other: None. IMPRESSION: No acute intracranial abnormality.  Stable atrophy pattern. Chronic sinus disease with a right maxillary air-fluid level suspicious for superimposed acute sinusitis No acute cervical spine fracture or malalignment. Multilevel cervical degenerative spondylosis 14 mm spiculated right upper lobe nodule partially imaged. This warrants follow-up nonemergent chest CT Electronically Signed   By: Jerilynn Mages.  Shick M.D.   On: 12/07/2017 14:19   Ct Cervical Spine Wo Contrast  Result Date: 12/07/2017 CLINICAL DATA:   Fall, head injury, headache and neck pain EXAM: CT HEAD WITHOUT CONTRAST CT CERVICAL SPINE WITHOUT CONTRAST TECHNIQUE: Multidetector CT imaging of the head and  cervical spine was performed following the standard protocol without intravenous contrast. Multiplanar CT image reconstructions of the cervical spine were also generated. COMPARISON:  06/11/2017 FINDINGS: CT HEAD FINDINGS Brain: Stable age related brain atrophy pattern. No acute intracranial hemorrhage, mass lesion, new infarction, midline shift, herniation hydrocephalus, or extra-axial fluid collection. No focal mass effect or edema. Cisterns are patent. Cerebellar atrophy as well. Vascular: No hyperdense vessel or unexpected calcification. Skull: Normal. Negative for fracture or focal lesion. Sinuses/Orbits: Chronic sinus mucosal thickening throughout the sinuses. Right maxillary air-fluid level noted. Difficult to exclude sinusitis. Other: None. CT CERVICAL SPINE FINDINGS Alignment: Normal. Skull base and vertebrae: No acute fracture. No primary bone lesion or focal pathologic process. Soft tissues and spinal canal: No prevertebral soft tissue swelling or edema. No visible canal hematoma. Carotid atherosclerosis noted bilaterally. Disc levels: Moderate multilevel degenerative spondylosis spanning C1-C7. All levels demonstrate disc space narrowing, sclerosis and osteophytes. Multilevel facet arthropathy posteriorly. Degenerative changes of the C1-2 articulation as well. Upper chest: Partially imaged spiculated nodule in the right upper lobe measures 14 mm, image 94 series 7. Emphysema noted. Other: None. IMPRESSION: No acute intracranial abnormality.  Stable atrophy pattern. Chronic sinus disease with a right maxillary air-fluid level suspicious for superimposed acute sinusitis No acute cervical spine fracture or malalignment. Multilevel cervical degenerative spondylosis 14 mm spiculated right upper lobe nodule partially imaged. This warrants follow-up  nonemergent chest CT Electronically Signed   By: Jerilynn Mages.  Shick M.D.   On: 12/07/2017 14:19    CT the head and neck reviewed, no acute findings for trauma.  Questionable sinusitis on CT the patient denies clinical symptoms suggest sinusitis or demonstrate any evidence of sinus fracture or trauma.  Patient has known lung metastases, updated patient on wife of the nodule noted on CT ____________________________________________   PROCEDURES  Procedure(s) performed: None  Procedures  Critical Care performed: No  ____________________________________________   INITIAL IMPRESSION / ASSESSMENT AND PLAN / ED COURSE  Pertinent labs & imaging results that were available during my care of the patient were reviewed by me and considered in my medical decision making (see chart for details).  Patient presents for evaluation after a fall.  He reports slipping from a stool while painting.  There was a brief loss of consciousness after or during the fall, and question if he may have sustained a concussion.  To check basic labs as the patient is a chemo patient though he denies any acute metabolic or infectious symptoms.  No cardiac or pulmonary symptoms.  No evidence of significant injury by examination he does complain of a right-sided throbbing headache and had some slight confusion earlier today, but is now alert and well-oriented.  Denies cardiac symptoms.  Reassuring hemodynamics.  Has careful close follow-up care with oncology.  Offered an x-ray to assure the patient did not have any bony injury about the right elbow, he reports he does not wish for one does not feel that he is injured the elbow at all.  I would agree this seems reasonable.    I will prescribe the patient a narcotic pain medicine due to their condition which I anticipate will cause at least moderate pain short term. I discussed with the patient safe use of narcotic pain medicines, and that they are not to drive, work in dangerous areas,  or ever take more than prescribed (no more than 1 pill every 6 hours). We discussed that this is the type of medication that can be  overdosed on and the risks  of this type of medicine. Patient is very agreeable to only use as prescribed and to never use more than prescribed.  Patient's wife driving home. Return precautions and treatment recommendations and follow-up discussed with the patient who is agreeable with the plan.  ____________________________________________   FINAL CLINICAL IMPRESSION(S) / ED DIAGNOSES  Final diagnoses:  Fall, initial encounter  Concussion with loss of consciousness of 30 minutes or less, initial encounter  Skin tear of elbow without complication, initial encounter      NEW MEDICATIONS STARTED DURING THIS VISIT:  New Prescriptions   TRAMADOL (ULTRAM) 50 MG TABLET    Take 1 tablet (50 mg total) by mouth every 6 (six) hours as needed for moderate pain or severe pain.     Note:  This document was prepared using Dragon voice recognition software and may include unintentional dictation errors.     Delman Kitten, MD 12/07/17 1710

## 2017-12-07 NOTE — ED Notes (Signed)
Pt ambulatory to toilet without difficulty.

## 2017-12-10 DIAGNOSIS — D485 Neoplasm of uncertain behavior of skin: Secondary | ICD-10-CM | POA: Diagnosis not present

## 2017-12-12 DIAGNOSIS — W19XXXA Unspecified fall, initial encounter: Secondary | ICD-10-CM | POA: Diagnosis not present

## 2017-12-12 DIAGNOSIS — S5001XA Contusion of right elbow, initial encounter: Secondary | ICD-10-CM | POA: Diagnosis not present

## 2017-12-13 DIAGNOSIS — K209 Esophagitis, unspecified: Secondary | ICD-10-CM | POA: Diagnosis not present

## 2017-12-17 MED FILL — SUTENT 50 MG CAPS: 50 | 21 days supply | Qty: 14 | Fill #3

## 2017-12-20 DIAGNOSIS — J449 Chronic obstructive pulmonary disease, unspecified: Secondary | ICD-10-CM | POA: Diagnosis not present

## 2017-12-20 DIAGNOSIS — R05 Cough: Secondary | ICD-10-CM | POA: Diagnosis not present

## 2017-12-20 DIAGNOSIS — C642 Malignant neoplasm of left kidney, except renal pelvis: Secondary | ICD-10-CM | POA: Diagnosis not present

## 2017-12-20 DIAGNOSIS — R0602 Shortness of breath: Secondary | ICD-10-CM | POA: Diagnosis not present

## 2017-12-23 ENCOUNTER — Encounter: Payer: Self-pay | Admitting: *Deleted

## 2017-12-23 ENCOUNTER — Inpatient Hospital Stay (HOSPITAL_BASED_OUTPATIENT_CLINIC_OR_DEPARTMENT_OTHER): Payer: PPO | Admitting: Internal Medicine

## 2017-12-23 ENCOUNTER — Encounter: Payer: Self-pay | Admitting: Internal Medicine

## 2017-12-23 ENCOUNTER — Inpatient Hospital Stay: Payer: PPO | Attending: Internal Medicine

## 2017-12-23 ENCOUNTER — Other Ambulatory Visit: Payer: Self-pay | Admitting: *Deleted

## 2017-12-23 VITALS — BP 106/72 | HR 65 | Temp 96.1°F | Wt 204.8 lb

## 2017-12-23 DIAGNOSIS — I2510 Atherosclerotic heart disease of native coronary artery without angina pectoris: Secondary | ICD-10-CM | POA: Diagnosis not present

## 2017-12-23 DIAGNOSIS — E041 Nontoxic single thyroid nodule: Secondary | ICD-10-CM | POA: Diagnosis not present

## 2017-12-23 DIAGNOSIS — Z87442 Personal history of urinary calculi: Secondary | ICD-10-CM | POA: Insufficient documentation

## 2017-12-23 DIAGNOSIS — R05 Cough: Secondary | ICD-10-CM | POA: Diagnosis not present

## 2017-12-23 DIAGNOSIS — Z87891 Personal history of nicotine dependence: Secondary | ICD-10-CM | POA: Diagnosis not present

## 2017-12-23 DIAGNOSIS — Z85528 Personal history of other malignant neoplasm of kidney: Secondary | ICD-10-CM | POA: Diagnosis not present

## 2017-12-23 DIAGNOSIS — R42 Dizziness and giddiness: Secondary | ICD-10-CM | POA: Diagnosis not present

## 2017-12-23 DIAGNOSIS — D696 Thrombocytopenia, unspecified: Secondary | ICD-10-CM | POA: Insufficient documentation

## 2017-12-23 DIAGNOSIS — E039 Hypothyroidism, unspecified: Secondary | ICD-10-CM | POA: Diagnosis not present

## 2017-12-23 DIAGNOSIS — C7802 Secondary malignant neoplasm of left lung: Secondary | ICD-10-CM | POA: Diagnosis not present

## 2017-12-23 DIAGNOSIS — R630 Anorexia: Secondary | ICD-10-CM | POA: Diagnosis not present

## 2017-12-23 DIAGNOSIS — Z79899 Other long term (current) drug therapy: Secondary | ICD-10-CM | POA: Diagnosis not present

## 2017-12-23 DIAGNOSIS — Z7982 Long term (current) use of aspirin: Secondary | ICD-10-CM | POA: Diagnosis not present

## 2017-12-23 DIAGNOSIS — Z85820 Personal history of malignant melanoma of skin: Secondary | ICD-10-CM

## 2017-12-23 DIAGNOSIS — C7801 Secondary malignant neoplasm of right lung: Secondary | ICD-10-CM | POA: Diagnosis not present

## 2017-12-23 DIAGNOSIS — J0110 Acute frontal sinusitis, unspecified: Secondary | ICD-10-CM | POA: Diagnosis not present

## 2017-12-23 DIAGNOSIS — J449 Chronic obstructive pulmonary disease, unspecified: Secondary | ICD-10-CM

## 2017-12-23 DIAGNOSIS — Z905 Acquired absence of kidney: Secondary | ICD-10-CM

## 2017-12-23 DIAGNOSIS — Z23 Encounter for immunization: Secondary | ICD-10-CM

## 2017-12-23 DIAGNOSIS — R000 Tachycardia, unspecified: Secondary | ICD-10-CM | POA: Diagnosis not present

## 2017-12-23 DIAGNOSIS — C642 Malignant neoplasm of left kidney, except renal pelvis: Secondary | ICD-10-CM

## 2017-12-23 DIAGNOSIS — Z806 Family history of leukemia: Secondary | ICD-10-CM | POA: Diagnosis not present

## 2017-12-23 DIAGNOSIS — I10 Essential (primary) hypertension: Secondary | ICD-10-CM | POA: Insufficient documentation

## 2017-12-23 DIAGNOSIS — I951 Orthostatic hypotension: Secondary | ICD-10-CM | POA: Insufficient documentation

## 2017-12-23 LAB — CBC WITH DIFFERENTIAL/PLATELET
BASOS PCT: 0 %
Basophils Absolute: 0 10*3/uL (ref 0–0.1)
EOS ABS: 0.1 10*3/uL (ref 0–0.7)
Eosinophils Relative: 2 %
HCT: 39.9 % — ABNORMAL LOW (ref 40.0–52.0)
HEMOGLOBIN: 14.1 g/dL (ref 13.0–18.0)
LYMPHS ABS: 1 10*3/uL (ref 1.0–3.6)
Lymphocytes Relative: 29 %
MCH: 36.5 pg — ABNORMAL HIGH (ref 26.0–34.0)
MCHC: 35.4 g/dL (ref 32.0–36.0)
MCV: 103.1 fL — ABNORMAL HIGH (ref 80.0–100.0)
Monocytes Absolute: 0.3 10*3/uL (ref 0.2–1.0)
Monocytes Relative: 10 %
NEUTROS PCT: 59 %
Neutro Abs: 2.1 10*3/uL (ref 1.4–6.5)
Platelets: 123 10*3/uL — ABNORMAL LOW (ref 150–440)
RBC: 3.87 MIL/uL — AB (ref 4.40–5.90)
RDW: 17.4 % — ABNORMAL HIGH (ref 11.5–14.5)
WBC: 3.6 10*3/uL — AB (ref 3.8–10.6)

## 2017-12-23 LAB — COMPREHENSIVE METABOLIC PANEL
ALBUMIN: 4.1 g/dL (ref 3.5–5.0)
ALK PHOS: 84 U/L (ref 38–126)
ALT: 14 U/L — AB (ref 17–63)
AST: 21 U/L (ref 15–41)
Anion gap: 9 (ref 5–15)
BUN: 17 mg/dL (ref 6–20)
CALCIUM: 9.2 mg/dL (ref 8.9–10.3)
CO2: 28 mmol/L (ref 22–32)
CREATININE: 1.31 mg/dL — AB (ref 0.61–1.24)
Chloride: 99 mmol/L — ABNORMAL LOW (ref 101–111)
GFR calc Af Amer: 59 mL/min — ABNORMAL LOW (ref >60)
GFR calc non Af Amer: 51 mL/min — ABNORMAL LOW (ref >60)
GLUCOSE: 132 mg/dL — AB (ref 65–99)
Potassium: 3.9 mmol/L (ref 3.5–5.1)
SODIUM: 136 mmol/L (ref 135–145)
Total Bilirubin: 1.1 mg/dL (ref 0.3–1.2)
Total Protein: 7.1 g/dL (ref 6.5–8.1)

## 2017-12-23 NOTE — Progress Notes (Signed)
[facility] OFFICE PROGRESS NOTE  Patient Care Team: Dion Body, MD as PCP - General (Family Medicine) Jannet Mantis, MD (Dermatology) Bary Castilla Forest Gleason, MD (General Surgery) Hollice Espy, MD as Consulting Physician (Urology)  No matching staging information was found for the patient.   Oncology History   # MAY- June 2017- METASTATIC CLEAR CELL; LEFT RENAL CA/STAGE IV; [place]- G-3; INTERMEDIATE RISK;  [bil Pul lung nodules;incidental s/p Bx Dr.Byrnett/Dr.Oaks] s/p Cytoreductive nephrectomy; Dr.Brandon- [id number]; July 11th 2017 CT- Enlarging Lung nodules  # Aug 7th 2017- PAZOPANIB; STOP SEP 2017 [pancreatitis/poor tol]  # OCT 1 week- START NIVO q 2W x4; DEC 8th CT- "Progression"; Continue Opdivo;APRIL-MAY 2018- STABLE LUNG NODULES [stopped sec to severe cough; NO Pneumonitis]  # May 31st 2018- [place] 60mg /day; July 31st CT lung- improved lung nodules; Sep 1st week- cabo- 40mg /day [dose reduced sec to mult intol]; Oct 1st 2week-[place] 20mg /day;   # NOV 15th 2018- CT chest- slight progression of lung nodules [poor tol to higer doses];   # Nov 28th 2018- SUTENT 50 mg 2w-on  &1 w-OFF  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    In the interim patient was evaluated in the emergency room for syncope-CT scan noncontrast negative for any metastatic disease.  CT of the neck negative for any fractures; however showed a right upper lobe 14 mm lung nodule.  Patient states that she has been feeling dizzy over the last few weeks especially on standing.  She denies any new medications.   Denies any headaches.  Denies any chest pain or hemoptysis.  He denies any addition of any new medications.  Continues to have chronic shortness of breath chronic cough.  Not any worse.   REVIEW OF SYSTEMS:  A complete 10 point review of system is done which is negative except mentioned above/history of present illness.   PAST MEDICAL HISTORY :  Past Medical History:  Diagnosis Date  . CAD (coronary artery disease)   . Cancer ([place])    left arm  . COPD (chronic obstructive pulmonary disease) ([place])   . Hypertension   . Kidney stone   . Lung cancer ([place])   . Melanoma ([place]) 01/24/2016   right shoulder  . Thyroid nodule 02/02/2016   left BENIGN THYROID NODULE by FNA    PAST SURGICAL HISTORY :   Past Surgical History:  Procedure Laterality Date  . arm surgery Left    arm  . cardiac stents  2011   Angioplasty / Stenting Femoral-X2  . COLONOSCOPY  04/01/12  . CORONARY ANGIOPLASTY    . EXCISION MELANOMA WITH SENTINEL LYMPH NODE BIOPSY Right 01/24/2016   Procedure: EXCISION MELANOMA Right Shoulder;  Surgeon: Robert Bellow, MD;  Location: [facility] ORS;  Service: General;  Laterality: Right;  . LAPAROSCOPIC NEPHRECTOMY, HAND ASSISTED Left 04/24/2016   Procedure: HAND ASSISTED LAPAROSCOPIC NEPHRECTOMY;  Surgeon: Hollice Espy, MD;  Location: [facility] ORS;  Service: Urology;  Laterality: Left;  Marland Kitchen VIDEO ASSISTED THORACOSCOPY (VATS)/THOROCOTOMY Left 03/08/2016   Procedure: VIDEO ASSISTED THORACOSCOPY (VATS) with lung biopsy - Left ;  Surgeon: Dellis Filbert  Amedeo Kinsman, MD;  Location: [facility] ORS;  Service: General;  Laterality: Left;    FAMILY HISTORY :   Family History  Problem Relation Age of Onset  . Hodgkin's lymphoma Daughter 5  . Kidney cancer Neg Hx   . Kidney disease Neg Hx   . Prostate cancer Neg Hx     SOCIAL HISTORY:   Social History   Tobacco Use  . Smoking status: Former Smoker    Types: Cigarettes    Start date: 01/18/1956    Last attempt to quit: 01/17/1969    Years since quitting:  48.9  . Smokeless tobacco: Never Used  Substance Use Topics  . Alcohol use: No    Alcohol/week: 0.0 oz    Comment: BEER OCC  . Drug use: No    ALLERGIES:  is allergic to lisinopril and other.  MEDICATIONS:  Current Outpatient Medications  Medication Sig Dispense Refill  . acetaminophen (TYLENOL) 500 MG tablet Take 500 mg by mouth every 6 (six) hours as needed for moderate pain.    Marland Kitchen albuterol (PROVENTIL HFA;VENTOLIN HFA) 108 (90 Base) MCG/ACT inhaler Inhale 2 puffs into the lungs every 6 (six) hours as needed for wheezing or shortness of breath. 1 Inhaler 2  . amLODipine (NORVASC) 5 MG tablet Take 1 tablet (5 mg total) by mouth 2 (two) times daily. 60 tablet 0  . aspirin EC 81 MG tablet Take 81 mg by mouth daily.    Marland Kitchen atorvastatin (LIPITOR) 40 MG tablet Take 40 mg by mouth daily.    . busPIRone (BUSPAR) 5 MG tablet Take 2 tablets by mouth daily.    . clopidogrel (PLAVIX) 75 MG tablet Take 75 mg by mouth daily.    Marland Kitchen docusate sodium (COLACE) 100 MG capsule Take 1 capsule (100 mg total) by mouth 2 (two) times daily. 60 capsule 3  . DULoxetine (CYMBALTA) 30 MG capsule Take 2 capsules by mouth daily.    . Fluticasone-Salmeterol (ADVAIR DISKUS) 500-50 MCG/DOSE AEPB Inhale 1 puff into the lungs 2 (two) times daily. 60 each 11  . ipratropium-albuterol (DUONEB) 0.5-2.5 (3) MG/3ML SOLN Take 3 mLs by nebulization every 4 (four) hours as needed. 360 mL 3  . levofloxacin (LEVAQUIN) 500 MG tablet Take 1 tablet (500 mg total) daily by mouth. 7 tablet 0  . loratadine (CLARITIN) 10 MG tablet Take 10 mg by mouth daily.    Marland Kitchen MELATONIN PO Take 10 mg by mouth at bedtime.     . metoprolol succinate (TOPROL-XL) 50 MG 24 hr tablet Take 50 mg by mouth every morning.     . nitroGLYCERIN (NITROSTAT) 0.4 MG SL tablet Place 0.4 mg under the tongue every 5 (five) minutes as needed.     . OXYGEN Inhale 3 L into the lungs continuous. bedtime    . SUNItinib (SUTENT) 50 MG capsule Take 1 capsule (50 mg total) daily by  mouth. Take for 2 weeks ON, then 1 week OFF. Do NOT start until okay by MD. 14 capsule 4  . tamsulosin (FLOMAX) 0.4 MG CAPS capsule Take 0.4 mg by mouth every morning.     . Tiotropium Bromide Monohydrate (SPIRIVA RESPIMAT) 1.25 MCG/ACT AERS Inhale 1.25 Act into the lungs 2 (two) times daily. 1 Inhaler 11  . traMADol (ULTRAM) 50 MG tablet Take 1 tablet (50 mg total) by mouth every 6 (six) hours as needed for moderate pain or severe pain. 12 tablet 0  . triamcinolone ointment (KENALOG) 0.5 % Apply 1 application topically 2 (two) times daily.  30 g 0  . losartan (COZAAR) 50 MG tablet Take 50 mg by mouth every morning.     . magic mouthwash w/lidocaine SOLN Take 5 mLs by mouth 4 (four) times daily. (Patient not taking: Reported on 12/23/2017) 480 mL 3   No current facility-administered medications for this visit.     PHYSICAL EXAMINATION: ECOG PERFORMANCE STATUS: 0 - Asymptomatic  BP 106/72 (Patient Position: Standing)   Pulse 65   Temp (!) 96.1 F (35.6 C) (Tympanic)   Wt 204 lb 12.8 oz (92.9 kg)   SpO2 97%   BMI 27.02 kg/m   Filed Weights   12/23/17 1118  Weight: 204 lb 12.8 oz (92.9 kg)    GENERAL: Well-nourished well-developed; Alert, no distress and comfortable.  He is alone.   EYES: no pallor or icterus OROPHARYNX: no thrush or ulceration; NECK: supple, no masses felt LYMPH:  no palpable lymphadenopathy in the cervical, axillary or inguinal regions LUNGS: Decreased breath sounds to auscultation at the bases.  No wheeze or crackles HEART/CVS: regular rate & rhythm and no murmurs; No lower extremity edema ABDOMEN:abdomen soft, non-tender and normal bowel sounds Musculoskeletal:no cyanosis of digits and no clubbing  PSYCH: alert & oriented x 3 with fluent speech NEURO: no focal motor/sensory deficits SKIN:  Skin rash resolved.  LABORATORY DATA:  I have reviewed the data as listed    Component Value Date/Time   NA 136 12/23/2017 1052   NA 146 (H) 05/25/2016 1514   K 3.9  12/23/2017 1052   CL 99 (L) 12/23/2017 1052   CO2 28 12/23/2017 1052   GLUCOSE 132 (H) 12/23/2017 1052   BUN 17 12/23/2017 1052   BUN 19 05/25/2016 1514   CREATININE 1.31 (H) 12/23/2017 1052   CALCIUM 9.2 12/23/2017 1052   PROT 7.1 12/23/2017 1052   ALBUMIN 4.1 12/23/2017 1052   AST 21 12/23/2017 1052   ALT 14 (L) 12/23/2017 1052   ALKPHOS 84 12/23/2017 1052   BILITOT 1.1 12/23/2017 1052   GFRNONAA 51 (L) 12/23/2017 1052   GFRAA 59 (L) 12/23/2017 1052    No results found for: SPEP, UPEP  Lab Results  Component Value Date   WBC 3.6 (L) 12/23/2017   NEUTROABS 2.1 12/23/2017   HGB 14.1 12/23/2017   HCT 39.9 (L) 12/23/2017   MCV 103.1 (H) 12/23/2017   PLT 123 (L) 12/23/2017      Chemistry      Component Value Date/Time   NA 136 12/23/2017 1052   NA 146 (H) 05/25/2016 1514   K 3.9 12/23/2017 1052   CL 99 (L) 12/23/2017 1052   CO2 28 12/23/2017 1052   BUN 17 12/23/2017 1052   BUN 19 05/25/2016 1514   CREATININE 1.31 (H) 12/23/2017 1052      Component Value Date/Time   CALCIUM 9.2 12/23/2017 1052   ALKPHOS 84 12/23/2017 1052   AST 21 12/23/2017 1052   ALT 14 (L) 12/23/2017 1052   BILITOT 1.1 12/23/2017 1052     RADIOGRAPHIC STUDIES: I have personally reviewed the radiological images as listed and agreed with the findings in the report. No results found.  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   # currently on sutent [since Nov 28th] 50 mg 2 weeks on 1 week off.  Patient tolerating treatment fairly well so far without any major side effects  except for mild thrombocytopenia/orthostatic hypotension  #Orthostatic hypotension-unclear etiology.  Laying 135/88; standing- 106/72l; hold flomax; Cymbalta taper to 1 pill/day x1 week; and then come off. Stop norvasc.  Continue metoprolol.  We will repeat TSH  today.  If not improved will consider adding Midodrin.  #Mild thrombocytopenia platelets 120s-monitor for now asymptomatic.  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  Normal.  #Chronic cough improved/stable.  #Hand-foot syndrome none; continue prophylaxis as discussed.  # follow up in  2 weeks; tsh today.  No orders of the defined types were placed in this encounter.     Cammie Sickle, MD 12/23/2017 12:57 PM

## 2017-12-23 NOTE — Progress Notes (Signed)
Patient had a fall 2 weeks ago and was treated at the ED. Injuries to head ( CT of head was done/ negative results), right elbow, right shoulder, per patient only had bruise bones with fall.   Othostatic :  Lying : b/p 135/88 P: 62  sitting b/p 120/80 P: 66 Standing: b/p 106/72 P: 69

## 2017-12-23 NOTE — Patient Instructions (Signed)
#  Stop Flomax  #Start taking Cymbalta 1 pill a day for 1 week; and then stop  #Stop Norvasc; continue metoprolol.

## 2017-12-23 NOTE — Assessment & Plan Note (Addendum)
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   # currently on sutent [since Nov 28th] 50 mg 2 weeks on 1 week off.  Patient tolerating treatment fairly well so far without any major side effects except for mild thrombocytopenia/orthostatic hypotension  #Orthostatic hypotension-unclear etiology.  Laying 135/88; standing- 106/72l; hold flomax; Cymbalta taper to 1 pill/day x1 week; and then come off. Stop norvasc.  Continue metoprolol.  We will repeat TSH today.  If not improved will consider adding Midodrin.  #Mild thrombocytopenia platelets 120s-monitor for now asymptomatic.  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  Normal.  #Chronic cough improved/stable.  #Hand-foot syndrome none; continue prophylaxis as discussed.  # follow up in  2 weeks; tsh today.  Will order CT scan at next visit.

## 2017-12-24 DIAGNOSIS — D485 Neoplasm of uncertain behavior of skin: Secondary | ICD-10-CM | POA: Diagnosis not present

## 2017-12-24 DIAGNOSIS — D229 Melanocytic nevi, unspecified: Secondary | ICD-10-CM | POA: Diagnosis not present

## 2017-12-25 ENCOUNTER — Telehealth: Payer: Self-pay | Admitting: Pharmacist

## 2017-12-25 NOTE — Telephone Encounter (Signed)
Oral Chemotherapy Pharmacist Encounter   Attempted to reach patient for follow up on oral medication: Sutent (sunitinib). No answer. Left VM for patient to call back.    Darl Pikes, PharmD, BCPS Hematology/Oncology Clinical Pharmacist [facility] [phone]  12/25/2017 3:58 PM

## 2017-12-26 NOTE — Telephone Encounter (Signed)
Oral Chemotherapy Pharmacist Encounter  Follow-Up Form  Called patient today to follow up regarding patient's oral chemotherapy medication: Sutent (sunitinib)  Original Start date of oral chemotherapy: 09/2017  Pt reports 0 tablets/doses of sunitinib missed in the last cycle.   Pt reports the following side effects: none reported  Recent labs reviewed: CBC/CMP from 12/23/17  New medications?: none reported  Other Issues: N/A  Patient knows to call the office with questions or concerns. Oral Oncology Clinic will continue to follow.  Darl Pikes, PharmD, BCPS Hematology/Oncology Clinical Pharmacist [facility] [phone]  12/26/2017 11:48 AM

## 2017-12-27 ENCOUNTER — Telehealth: Payer: Self-pay | Admitting: *Deleted

## 2017-12-27 ENCOUNTER — Inpatient Hospital Stay: Payer: PPO

## 2017-12-27 ENCOUNTER — Encounter: Payer: Self-pay | Admitting: Internal Medicine

## 2017-12-27 ENCOUNTER — Inpatient Hospital Stay (HOSPITAL_BASED_OUTPATIENT_CLINIC_OR_DEPARTMENT_OTHER): Payer: PPO | Admitting: Oncology

## 2017-12-27 ENCOUNTER — Other Ambulatory Visit: Payer: Self-pay | Admitting: Internal Medicine

## 2017-12-27 VITALS — BP 166/87 | HR 43

## 2017-12-27 VITALS — BP 144/96 | HR 50 | Temp 96.8°F | Resp 16 | Wt 193.8 lb

## 2017-12-27 DIAGNOSIS — I2510 Atherosclerotic heart disease of native coronary artery without angina pectoris: Secondary | ICD-10-CM

## 2017-12-27 DIAGNOSIS — C642 Malignant neoplasm of left kidney, except renal pelvis: Secondary | ICD-10-CM

## 2017-12-27 DIAGNOSIS — R51 Headache: Principal | ICD-10-CM

## 2017-12-27 DIAGNOSIS — C7801 Secondary malignant neoplasm of right lung: Secondary | ICD-10-CM

## 2017-12-27 DIAGNOSIS — Z87891 Personal history of nicotine dependence: Secondary | ICD-10-CM

## 2017-12-27 DIAGNOSIS — C7802 Secondary malignant neoplasm of left lung: Secondary | ICD-10-CM

## 2017-12-27 DIAGNOSIS — Z85528 Personal history of other malignant neoplasm of kidney: Secondary | ICD-10-CM | POA: Diagnosis not present

## 2017-12-27 DIAGNOSIS — E039 Hypothyroidism, unspecified: Secondary | ICD-10-CM

## 2017-12-27 DIAGNOSIS — I951 Orthostatic hypotension: Secondary | ICD-10-CM

## 2017-12-27 DIAGNOSIS — R05 Cough: Secondary | ICD-10-CM

## 2017-12-27 DIAGNOSIS — E041 Nontoxic single thyroid nodule: Secondary | ICD-10-CM

## 2017-12-27 DIAGNOSIS — R630 Anorexia: Secondary | ICD-10-CM | POA: Diagnosis not present

## 2017-12-27 DIAGNOSIS — J019 Acute sinusitis, unspecified: Secondary | ICD-10-CM

## 2017-12-27 DIAGNOSIS — R6889 Other general symptoms and signs: Secondary | ICD-10-CM

## 2017-12-27 DIAGNOSIS — I10 Essential (primary) hypertension: Secondary | ICD-10-CM

## 2017-12-27 DIAGNOSIS — Z87442 Personal history of urinary calculi: Secondary | ICD-10-CM

## 2017-12-27 DIAGNOSIS — Z7982 Long term (current) use of aspirin: Secondary | ICD-10-CM

## 2017-12-27 DIAGNOSIS — D696 Thrombocytopenia, unspecified: Secondary | ICD-10-CM

## 2017-12-27 DIAGNOSIS — R000 Tachycardia, unspecified: Secondary | ICD-10-CM

## 2017-12-27 DIAGNOSIS — C439 Malignant melanoma of skin, unspecified: Secondary | ICD-10-CM

## 2017-12-27 DIAGNOSIS — Z85820 Personal history of malignant melanoma of skin: Secondary | ICD-10-CM

## 2017-12-27 DIAGNOSIS — J0110 Acute frontal sinusitis, unspecified: Secondary | ICD-10-CM

## 2017-12-27 DIAGNOSIS — Z905 Acquired absence of kidney: Secondary | ICD-10-CM

## 2017-12-27 DIAGNOSIS — R42 Dizziness and giddiness: Secondary | ICD-10-CM

## 2017-12-27 DIAGNOSIS — Z806 Family history of leukemia: Secondary | ICD-10-CM

## 2017-12-27 DIAGNOSIS — J449 Chronic obstructive pulmonary disease, unspecified: Secondary | ICD-10-CM

## 2017-12-27 DIAGNOSIS — R519 Headache, unspecified: Secondary | ICD-10-CM

## 2017-12-27 DIAGNOSIS — Z79899 Other long term (current) drug therapy: Secondary | ICD-10-CM | POA: Diagnosis not present

## 2017-12-27 LAB — INFLUENZA PANEL BY PCR (TYPE A & B)
INFLAPCR: NEGATIVE
INFLBPCR: NEGATIVE

## 2017-12-27 MED ORDER — SODIUM CHLORIDE 0.9 % IV SOLN: 10.0000 mg | Freq: Once | INTRAVENOUS | Status: DC

## 2017-12-27 MED ORDER — SODIUM CHLORIDE 0.9 % IV SOLN
INTRAVENOUS | Status: DC
  Administered 2017-12-27: 15:00:00 via INTRAVENOUS
  Filled 2017-12-27 (×2): qty 1000

## 2017-12-27 MED ORDER — FLUTICASONE PROPIONATE 50 MCG/ACT NA SUSP: 2.0000 | Freq: Every day | NASAL | 2 refills | Status: AC

## 2017-12-27 MED ORDER — DEXAMETHASONE SODIUM PHOSPHATE 10 MG/ML IJ SOLN: 10.0000 mg | Freq: Once | INTRAMUSCULAR | Status: DC

## 2017-12-27 MED ORDER — DEXAMETHASONE SODIUM PHOSPHATE 10 MG/ML IJ SOLN
10.0000 mg | Freq: Once | INTRAMUSCULAR | Status: AC
  Administered 2017-12-27: 10 mg via INTRAVENOUS
  Filled 2017-12-27: qty 1

## 2017-12-27 NOTE — Progress Notes (Signed)
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   Chief complaint/ Reason for visit- Flu-like symptoms  Heme/Onc history: # MAY- June 2017- METASTATIC CLEAR CELL; LEFT RENAL CA/STAGE IV; [place]- G-3; INTERMEDIATE RISK;  [bil Pul lung nodules;incidental s/p Bx Dr.Byrnett/Dr.Oaks] s/p Cytoreductive nephrectomy; Dr.Brandon- [id number]; July 11th 2017 CT- Enlarging Lung nodules  # Aug 7th 2017- PAZOPANIB; STOP SEP 2017 [pancreatitis/poor tol]  # OCT 1 week- START NIVO q 2W x4; DEC 8th CT- "Progression"; Continue Opdivo;APRIL-MAY 2018- STABLE LUNG NODULES [stopped sec to severe cough; NO Pneumonitis]  # May 31st 2018- Cabo 60mg /day; July 31st CT lung- improved lung nodules; Sep 1st week- cabo- 40mg /day [dose reduced sec to mult intol]; Oct 1st 2week-Cabo 20mg /day;   # NOV 15th 2018- CT chest- slight progression of lung nodules [poor tol to higer doses];   # Nov 28th 2018- SUTENT 50 mg 2w-on  &1 w-OFF  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 Rogue Bussing on 12/23/2017 where he continued to tolerate his Sutent 50 mg well.  He had recently been evaluated in the emergency room for syncopal episode.  A CT scan revealed a right upper lobe 14 mm lung nodule but no other metastatic disease.  He complained of feeling dizzy when standing. He denied any new medication.  He denies any headaches, chest pain or hemoptysis.  He continued to  have shortness of breath and chronic cough but this was not any worse. He was found to have orthostatic hypotension and was told to hold his Flomax, taper off Cymbalta, stop Norvasc and to stop metoprolol.  Patient presents today to be seen for flulike symptoms.  He endorses feeling achy and chills accompanied with a headache that began approximately 7 days ago.  He has a cough that is chronic due to chemotherapy but admits to being congested with mild yellow nasal drainage.  He has not had an appetite in approximately 1 week.  He denies chest pain or shortness of breath.  He denies any fevers.  He denies any other complaints at this time.  ECOG FS:0 - Asymptomatic  Review of systems- Review of Systems  Constitutional: Positive for chills, malaise/fatigue and weight loss. Negative for fever.  HENT: Negative.   Eyes: Negative.   Respiratory: Positive for cough and sputum production. Negative for shortness of breath and wheezing.   Cardiovascular: Negative.   Gastrointestinal: Negative.  Negative for abdominal pain, diarrhea, nausea and vomiting.  Genitourinary: Negative.  Negative for dysuria and urgency.  Musculoskeletal: Negative.  Negative for myalgias and neck pain.  Skin: Negative.   Neurological: Positive for weakness. Negative for dizziness, tingling, tremors and headaches.  Endo/Heme/Allergies: Negative.   Psychiatric/Behavioral: Negative for depression and suicidal ideas.     Current treatment- Sutent  Allergies  Allergen Reactions  . Lisinopril Swelling  . Other Itching    Nitroglycerin Patch.     Past Medical History:  Diagnosis Date  . CAD (coronary artery disease)   . Cancer ([place])  left arm  . COPD (chronic obstructive pulmonary disease) ([place])   . Hypertension   . Kidney stone   . Lung cancer ([place])   . Melanoma ([place]) 01/24/2016   right shoulder  . Thyroid nodule 02/02/2016   left BENIGN THYROID NODULE by FNA     Past Surgical History:  Procedure  Laterality Date  . arm surgery Left    arm  . cardiac stents  2011   Angioplasty / Stenting Femoral-X2  . COLONOSCOPY  04/01/12  . CORONARY ANGIOPLASTY    . EXCISION MELANOMA WITH SENTINEL LYMPH NODE BIOPSY Right 01/24/2016   Procedure: EXCISION MELANOMA Right Shoulder;  Surgeon: Robert Bellow, MD;  Location: [facility] ORS;  Service: General;  Laterality: Right;  . LAPAROSCOPIC NEPHRECTOMY, HAND ASSISTED Left 04/24/2016   Procedure: HAND ASSISTED LAPAROSCOPIC NEPHRECTOMY;  Surgeon: Hollice Espy, MD;  Location: [facility] ORS;  Service: Urology;  Laterality: Left;  Marland Kitchen VIDEO ASSISTED THORACOSCOPY (VATS)/THOROCOTOMY Left 03/08/2016   Procedure: VIDEO ASSISTED THORACOSCOPY (VATS) with lung biopsy - Left ;  Surgeon: Robert Bellow, MD;  Location: [facility] ORS;  Service: General;  Laterality: Left;    Social History   Socioeconomic History  . Marital status: Married    Spouse name: Not on file  . Number of children: Not on file  . Years of education: Not on file  . Highest education level: Not on file  Social Needs  . Financial resource strain: Not on file  . Food insecurity - worry: Not on file  . Food insecurity - inability: Not on file  . Transportation needs - medical: Not on file  . Transportation needs - non-medical: Not on file  Occupational History  . Not on file  Tobacco Use  . Smoking status: Former Smoker    Types: Cigarettes    Start date: 01/18/1956    Last attempt to quit: 01/17/1969    Years since quitting: 48.9  . Smokeless tobacco: Never Used  Substance and Sexual Activity  . Alcohol use: No    Alcohol/week: 0.0 oz    Comment: BEER OCC  . Drug use: No  . Sexual activity: Not on file  Other Topics Concern  . Not on file  Social History Narrative  . Not on file    Family History  Problem Relation Age of Onset  . Hodgkin's lymphoma Daughter 5  . Kidney cancer Neg Hx   . Kidney disease Neg Hx   . Prostate cancer Neg Hx      Current Outpatient Medications:  .   acetaminophen (TYLENOL) 500 MG tablet, Take 500 mg by mouth every 6 (six) hours as needed for moderate pain., Disp: , Rfl:  .  albuterol (PROVENTIL HFA;VENTOLIN HFA) 108 (90 Base) MCG/ACT inhaler, Inhale 2 puffs into the lungs every 6 (six) hours as needed for wheezing or shortness of breath., Disp: 1 Inhaler, Rfl: 2 .  amLODipine (NORVASC) 5 MG tablet, Take 1 tablet (5 mg total) by mouth 2 (two) times daily., Disp: 60 tablet, Rfl: 0 .  aspirin EC 81 MG tablet, Take 81 mg by mouth daily., Disp: , Rfl:  .  atorvastatin (LIPITOR) 40 MG tablet, Take 40 mg by mouth daily., Disp: , Rfl:  .  busPIRone (BUSPAR) 5 MG tablet, Take 2 tablets by mouth daily., Disp: , Rfl:  .  clopidogrel (PLAVIX) 75 MG tablet, Take 75 mg by mouth daily., Disp: , Rfl:  .  docusate sodium (COLACE) 100 MG capsule, Take 1 capsule (100 mg total) by  mouth 2 (two) times daily., Disp: 60 capsule, Rfl: 3 .  DULoxetine (CYMBALTA) 30 MG capsule, Take 2 capsules by mouth daily., Disp: , Rfl:  .  Fluticasone-Salmeterol (ADVAIR DISKUS) 500-50 MCG/DOSE AEPB, Inhale 1 puff into the lungs 2 (two) times daily., Disp: 60 each, Rfl: 11 .  ipratropium-albuterol (DUONEB) 0.5-2.5 (3) MG/3ML SOLN, Take 3 mLs by nebulization every 4 (four) hours as needed., Disp: 360 mL, Rfl: 3 .  levofloxacin (LEVAQUIN) 500 MG tablet, Take 1 tablet (500 mg total) daily by mouth., Disp: 7 tablet, Rfl: 0 .  loratadine (CLARITIN) 10 MG tablet, Take 10 mg by mouth daily., Disp: , Rfl:  .  losartan (COZAAR) 50 MG tablet, Take 50 mg by mouth every morning. , Disp: , Rfl:  .  magic mouthwash w/lidocaine SOLN, Take 5 mLs by mouth 4 (four) times daily., Disp: 480 mL, Rfl: 3 .  MELATONIN PO, Take 10 mg by mouth at bedtime. , Disp: , Rfl:  .  metoprolol succinate (TOPROL-XL) 50 MG 24 hr tablet, Take 50 mg by mouth every morning. , Disp: , Rfl:  .  nitroGLYCERIN (NITROSTAT) 0.4 MG SL tablet, Place 0.4 mg under the tongue every 5 (five) minutes as needed. , Disp: , Rfl:  .   OXYGEN, Inhale 3 L into the lungs continuous. bedtime, Disp: , Rfl:  .  SUNItinib (SUTENT) 50 MG capsule, Take 1 capsule (50 mg total) daily by mouth. Take for 2 weeks ON, then 1 week OFF. Do NOT start until okay by MD., Disp: 14 capsule, Rfl: 4 .  tamsulosin (FLOMAX) 0.4 MG CAPS capsule, Take 0.4 mg by mouth every morning. , Disp: , Rfl:  .  Tiotropium Bromide Monohydrate (SPIRIVA RESPIMAT) 1.25 MCG/ACT AERS, Inhale 1.25 Act into the lungs 2 (two) times daily., Disp: 1 Inhaler, Rfl: 11 .  traMADol (ULTRAM) 50 MG tablet, Take 1 tablet (50 mg total) by mouth every 6 (six) hours as needed for moderate pain or severe pain., Disp: 12 tablet, Rfl: 0 .  triamcinolone ointment (KENALOG) 0.5 %, Apply 1 application topically 2 (two) times daily., Disp: 30 g, Rfl: 0 .  fluticasone (FLONASE) 50 MCG/ACT nasal spray, Place 2 sprays into both nostrils daily., Disp: 16 g, Rfl: 2  Physical exam:  Vitals:   12/27/17 1425 12/27/17 1434  BP: (!) 144/96   Pulse: (!) 50   Resp: 16   Temp: (!) 96.8 F (36 C)   TempSrc: Tympanic   Weight:  193 lb 12.8 oz (87.9 kg)   Physical Exam  Constitutional: He is oriented to person, place, and time and well-developed, well-nourished, and in no distress.  HENT:  Head: Normocephalic and atraumatic.  Nose: Right sinus exhibits maxillary sinus tenderness and frontal sinus tenderness. Left sinus exhibits maxillary sinus tenderness and frontal sinus tenderness.  Mouth/Throat: Oropharynx is clear and moist. Mucous membranes are dry.  Eyes: Pupils are equal, round, and reactive to light.  Neck: Normal range of motion. Neck supple.  Cardiovascular: Regular rhythm and normal heart sounds. Bradycardia present.  Hypertensive  Pulmonary/Chest: Effort normal and breath sounds normal.  Abdominal: Soft. Bowel sounds are normal. He exhibits no distension. There is no tenderness. There is no rebound.  Musculoskeletal: Normal range of motion.  Neurological: He is alert and oriented to  person, place, and time.  Skin: Skin is warm and dry.  Psychiatric: Mood, memory, affect and judgment normal.     CMP Latest Ref Rng & Units 12/23/2017  Glucose 65 - 99 mg/dL 132(H)  BUN 6 - 20 mg/dL 17  Creatinine 0.61 - 1.24 mg/dL 1.31(H)  Sodium 135 - 145 mmol/L 136  Potassium 3.5 - 5.1 mmol/L 3.9  Chloride 101 - 111 mmol/L 99(L)  CO2 22 - 32 mmol/L 28  Calcium 8.9 - 10.3 mg/dL 9.2  Total Protein 6.5 - 8.1 g/dL 7.1  Total Bilirubin 0.3 - 1.2 mg/dL 1.1  Alkaline Phos 38 - 126 U/L 84  AST 15 - 41 U/L 21  ALT 17 - 63 U/L 14(L)   CBC Latest Ref Rng & Units 12/23/2017  WBC 3.8 - 10.6 K/uL 3.6(L)  Hemoglobin 13.0 - 18.0 g/dL 14.1  Hematocrit 40.0 - 52.0 % 39.9(L)  Platelets 150 - 440 K/uL 123(L)    No images are attached to the encounter.  Ct Head Wo Contrast  Result Date: 12/07/2017 CLINICAL DATA:  Fall, head injury, headache and neck pain EXAM: CT HEAD WITHOUT CONTRAST CT CERVICAL SPINE WITHOUT CONTRAST TECHNIQUE: Multidetector CT imaging of the head and cervical spine was performed following the standard protocol without intravenous contrast. Multiplanar CT image reconstructions of the cervical spine were also generated. COMPARISON:  06/11/2017 FINDINGS: CT HEAD FINDINGS Brain: Stable age related brain atrophy pattern. No acute intracranial hemorrhage, mass lesion, new infarction, midline shift, herniation hydrocephalus, or extra-axial fluid collection. No focal mass effect or edema. Cisterns are patent. Cerebellar atrophy as well. Vascular: No hyperdense vessel or unexpected calcification. Skull: Normal. Negative for fracture or focal lesion. Sinuses/Orbits: Chronic sinus mucosal thickening throughout the sinuses. Right maxillary air-fluid level noted. Difficult to exclude sinusitis. Other: None. CT CERVICAL SPINE FINDINGS Alignment: Normal. Skull base and vertebrae: No acute fracture. No primary bone lesion or focal pathologic process. Soft tissues and spinal canal: No prevertebral soft  tissue swelling or edema. No visible canal hematoma. Carotid atherosclerosis noted bilaterally. Disc levels: Moderate multilevel degenerative spondylosis spanning C1-C7. All levels demonstrate disc space narrowing, sclerosis and osteophytes. Multilevel facet arthropathy posteriorly. Degenerative changes of the C1-2 articulation as well. Upper chest: Partially imaged spiculated nodule in the right upper lobe measures 14 mm, image 94 series 7. Emphysema noted. Other: None. IMPRESSION: No acute intracranial abnormality.  Stable atrophy pattern. Chronic sinus disease with a right maxillary air-fluid level suspicious for superimposed acute sinusitis No acute cervical spine fracture or malalignment. Multilevel cervical degenerative spondylosis 14 mm spiculated right upper lobe nodule partially imaged. This warrants follow-up nonemergent chest CT Electronically Signed   By: Jerilynn Mages.  Shick M.D.   On: 12/07/2017 14:19   Ct Cervical Spine Wo Contrast  Result Date: 12/07/2017 CLINICAL DATA:  Fall, head injury, headache and neck pain EXAM: CT HEAD WITHOUT CONTRAST CT CERVICAL SPINE WITHOUT CONTRAST TECHNIQUE: Multidetector CT imaging of the head and cervical spine was performed following the standard protocol without intravenous contrast. Multiplanar CT image reconstructions of the cervical spine were also generated. COMPARISON:  06/11/2017 FINDINGS: CT HEAD FINDINGS Brain: Stable age related brain atrophy pattern. No acute intracranial hemorrhage, mass lesion, new infarction, midline shift, herniation hydrocephalus, or extra-axial fluid collection. No focal mass effect or edema. Cisterns are patent. Cerebellar atrophy as well. Vascular: No hyperdense vessel or unexpected calcification. Skull: Normal. Negative for fracture or focal lesion. Sinuses/Orbits: Chronic sinus mucosal thickening throughout the sinuses. Right maxillary air-fluid level noted. Difficult to exclude sinusitis. Other: None. CT CERVICAL SPINE FINDINGS  Alignment: Normal. Skull base and vertebrae: No acute fracture. No primary bone lesion or focal pathologic process. Soft tissues and spinal canal: No prevertebral soft tissue swelling or edema. No visible canal  hematoma. Carotid atherosclerosis noted bilaterally. Disc levels: Moderate multilevel degenerative spondylosis spanning C1-C7. All levels demonstrate disc space narrowing, sclerosis and osteophytes. Multilevel facet arthropathy posteriorly. Degenerative changes of the C1-2 articulation as well. Upper chest: Partially imaged spiculated nodule in the right upper lobe measures 14 mm, image 94 series 7. Emphysema noted. Other: None. IMPRESSION: No acute intracranial abnormality.  Stable atrophy pattern. Chronic sinus disease with a right maxillary air-fluid level suspicious for superimposed acute sinusitis No acute cervical spine fracture or malalignment. Multilevel cervical degenerative spondylosis 14 mm spiculated right upper lobe nodule partially imaged. This warrants follow-up nonemergent chest CT Electronically Signed   By: Jerilynn Mages.  Shick M.D.   On: 12/07/2017 14:19     Assessment and plan- Patient is a 77 y.o. male today for flulike symptoms.  Patient states this began approximately 7 days ago.  He is afebrile.  Initial examination revealed a well-looking man.  He has good color.  Vital signs are ok.  Patient had mild frontal and maxillary tenderness upon palpation.  Mildly dry mucous membranes.  No oral exudate present.  Blood pressure slightly elevated and slightly bradycardic.  Lungs are clear.  Chronic cough.  Flu swab negative.   1. Sinusitis: RX Flonase 2 sprays PRN for nasal congestion.  Recommended Claritin OTC.  Patient has mild frontal and maxillary sinus tenderness.  2.  Influenza PCR negative.  3.  Patient has not been eating or drinking.  His weight is down 11 pounds from last visit.  Wife is concerned he is dehydrated.  Dry mucous membranes.  Vital signs stable.  Will give 1 L normal saline  and 10 mg Decadron through peripheral IV.  Reassessment after fluids patient feels much better.  Vital signs remained stable.  Blood pressure remains elevated.  Will talk to Dr. Rogue Bussing.  He was recently taken off Norvasc, Metoprolol, Flomax and Cymbalta.  If hypertension persists patient is to let us know that we can reevaluate medications.  4. Hypertensive: BP may have been slightly elevated due to fluids and IV Decadron given during visit.  Patient aware to monitor BP at home.  5. Bradycardia: HR 50's. Continue to monitor. Recently stopped Metoprolol.  6. RTC as scheduled to see Dr. Rogue Bussing at the end of the month.  Visit Diagnosis 1. Flu-like symptoms   2. Acute non-recurrent frontal sinusitis   3. Acute sinusitis, recurrence not specified, unspecified location   4. 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 He also understands that He can call clinic at any time with any questions, concerns, or complaints.   Greater than 50% was spent in counseling and coordination of care with this patient including but not limited to discussion of the relevant topics above (See A&P) including, but not limited to diagnosis and management of acute and chronic medical conditions.    Faythe Casa, AGNP-C [facility]- [phone] Pager- [phone] 12/29/2017 2:34 PM

## 2017-12-27 NOTE — Telephone Encounter (Signed)
Spoke with patient's wife to evaluate patient's reported symptoms. Patient has a new onset x 2 days of Chills, body aches, severe headaches. Currently on Sutent therapy. Denies any fever and no nausea. Reports "extreme weakness in legs." "He feels terrible."  Spoke with Dr. Rogue Bussing. He wants to evaluate pt in symptom mgmt clinic this afternoon at 215pm. This time was given to patient's wife.

## 2017-12-30 ENCOUNTER — Ambulatory Visit
Admission: RE | Admit: 2017-12-30 | Discharge: 2017-12-30 | Disposition: A | Discharge status: Home / Self Care | Payer: PPO | Source: Ambulatory Visit | Attending: Internal Medicine | Admitting: Internal Medicine

## 2017-12-30 ENCOUNTER — Telehealth: Payer: Self-pay | Admitting: *Deleted

## 2017-12-30 DIAGNOSIS — R519 Headache, unspecified: Secondary | ICD-10-CM

## 2017-12-30 DIAGNOSIS — J0110 Acute frontal sinusitis, unspecified: Secondary | ICD-10-CM

## 2017-12-30 DIAGNOSIS — R51 Headache: Secondary | ICD-10-CM | POA: Insufficient documentation

## 2017-12-30 DIAGNOSIS — J019 Acute sinusitis, unspecified: Secondary | ICD-10-CM | POA: Insufficient documentation

## 2017-12-30 DIAGNOSIS — J0100 Acute maxillary sinusitis, unspecified: Secondary | ICD-10-CM

## 2017-12-30 MED ORDER — AMOXICILLIN-POT CLAVULANATE 875-125 MG PO TABS: 1.0000 | ORAL_TABLET | Freq: Two times a day (BID) | ORAL | 0 refills | Status: DC

## 2017-12-30 MED ORDER — GADOBENATE DIMEGLUMINE 529 MG/ML IV SOLN
20.0000 mL | Freq: Once | INTRAVENOUS | Status: AC | PRN
  Administered 2017-12-30: 19 mL via INTRAVENOUS

## 2017-12-30 NOTE — Telephone Encounter (Signed)
Mri Results provided to patient. rx for augmentin sent to patient's pharmacy. Pt gave verbal understanding.

## 2017-12-30 NOTE — Telephone Encounter (Signed)
-----   Message from Cammie Sickle, MD sent at 12/30/2017  2:00 PM EST ----- Glen Davis/[place]- Please inform pt MRI of the brain negative for any metastases.  However does show sinusitis recommend Augmentin 875 mg twice daily for 10 days. Follow up as planned.

## 2017-12-31 ENCOUNTER — Telehealth: Payer: Self-pay | Admitting: *Deleted

## 2017-12-31 NOTE — Telephone Encounter (Signed)
Wife called reporting that patient is very irritable and having mood swings. While leaving message with answering service, he told her 3 times to shut up and leave him alone. Please advise

## 2017-12-31 NOTE — Telephone Encounter (Signed)
Per Dr. Jacinto Reap, please have Sonia Baller contact the patient. She saw him last to obtain more details regarding this phone call.

## 2017-12-31 NOTE — Telephone Encounter (Signed)
Patient's wife called stating that she has noticed the patient has become progressively irritable over the past 2-3 weeks. He is having severe mood swings with angry outbursts. He is becoming progressively more forgetful and is trying to do things that are a safety concern such as standing on an ironing board to fix a light bulb. MRI done yesterday does not show evidence of metastatic disease in the brain. He is currently not taking his Sutent. He received IV Decadron on 2/8 last. When he came in for symptom management last week I gave him Flonase and recommended OTC antihistamine for his sinusitis. He was started on Augmentin yesterday and has had one dose. She states he continues to sleep a lot. He has not been febrile. I told her to have him push fluids at home and see if this is residual from steroids given last week. She is wondering if this is early dementia. Any other thoughts?  Thanks. Sonia Baller

## 2018-01-01 ENCOUNTER — Other Ambulatory Visit: Payer: Self-pay | Admitting: Oncology

## 2018-01-01 DIAGNOSIS — R410 Disorientation, unspecified: Secondary | ICD-10-CM

## 2018-01-01 MED ORDER — DEXAMETHASONE 2 MG PO TABS: ORAL_TABLET | ORAL | 0 refills | Status: DC

## 2018-01-01 NOTE — Telephone Encounter (Signed)
Patient will come in tomorrow for labs in the morning. I have placed an order for cortisol, ammonia, ACTH, UA and urine culture and thyroid panel. I've called in a prescription for Decadron 2 mg once per day beginning tomorrow after his labs.  Thanks, Sonia Baller

## 2018-01-01 NOTE — Progress Notes (Signed)
Prescription called in for 2 mg Decadron for 2 weeks beginning tomorrow after labs.  Faythe Casa, NP 01/01/2018 2:26 PM

## 2018-01-01 NOTE — Telephone Encounter (Signed)
Jennifer-I am still unsure of the etiology of his symptoms.  Question adrenal insufficiency versus others-recommend checking a.m. Cortisol/ACTH/thyroid profile-tomorrow morning. [cortsiol needs to be drawn in AM]  # Empirically start him on Decadron 2 mg once a day in the morning- starting tomorrow after the labs are drawn. [2 week; no refills]   #I will plan to see him as planned/based on above blood work/results.  If would appreciate if you patient/family; order the above. Thx  GB

## 2018-01-01 NOTE — Progress Notes (Signed)
Added labs after discussing further with Dr. Rogue Bussing. We have added an UA and urine culture, cortisol level, ammonia level, thyroid panel and ACTH. Will do a complete workup given the patient's unusual presentation.  Faythe Casa, NP 01/01/2018 2:18 PM

## 2018-01-02 ENCOUNTER — Inpatient Hospital Stay: Payer: PPO

## 2018-01-02 DIAGNOSIS — C7801 Secondary malignant neoplasm of right lung: Secondary | ICD-10-CM | POA: Diagnosis not present

## 2018-01-02 DIAGNOSIS — R410 Disorientation, unspecified: Secondary | ICD-10-CM

## 2018-01-02 LAB — URINALYSIS, COMPLETE (UACMP) WITH MICROSCOPIC
BACTERIA UA: NONE SEEN
BILIRUBIN URINE: NEGATIVE
Glucose, UA: NEGATIVE mg/dL
Hgb urine dipstick: NEGATIVE
Ketones, ur: NEGATIVE mg/dL
Leukocytes, UA: NEGATIVE
NITRITE: NEGATIVE
Protein, ur: NEGATIVE mg/dL
SPECIFIC GRAVITY, URINE: 1.015 (ref 1.005–1.030)
SQUAMOUS EPITHELIAL / LPF: NONE SEEN
pH: 6 (ref 5.0–8.0)

## 2018-01-02 LAB — AMMONIA: Ammonia: 16 umol/L (ref 9–35)

## 2018-01-02 LAB — CORTISOL: Cortisol, Plasma: 14.2 ug/dL

## 2018-01-02 NOTE — Progress Notes (Signed)
Labs so far appear to be ok

## 2018-01-03 ENCOUNTER — Telehealth: Payer: Self-pay | Admitting: Oncology

## 2018-01-03 LAB — URINE CULTURE: CULTURE: NO GROWTH

## 2018-01-03 LAB — THYROID PANEL WITH TSH
FREE THYROXINE INDEX: 1.4 (ref 1.2–4.9)
T3 UPTAKE RATIO: 28 % (ref 24–39)
T4, Total: 4.9 ug/dL (ref 4.5–12.0)
TSH: 2 u[IU]/mL (ref 0.450–4.500)

## 2018-01-03 LAB — ACTH: C206 ACTH: 27.9 pg/mL (ref 7.2–63.3)

## 2018-01-03 NOTE — Telephone Encounter (Addendum)
Spoke to [place] for follow-up to see how patient is doing. She states yesterday he spent all day in bed and he has not been eating or drinking. This morning he woke up with an appetite and has had 2 cups of coffee and breakfast.  Labs so far are all with in normal limits. (TSH, ACTH, UA, CBC, Ammonia, Cortisol)  Continues to take his antibiotics for sinus infection and is compliant with his prednisone.  He continues to  have angry outbursts. Overall she does not think there has been a change since last week when this began. She states he needs to begin his Flomax back because he is unable to initiate a full stream when urinating. Currently he is "dribbling". Ok per Dr. Rogue Bussing to re-start Flomax. Continue to monitor BP at home.   They're scheduled to see Dr. Rogue Bussing on Monday.  Faythe Casa, NP 01/03/2018 3:02 PM

## 2018-01-06 ENCOUNTER — Inpatient Hospital Stay (HOSPITAL_BASED_OUTPATIENT_CLINIC_OR_DEPARTMENT_OTHER): Payer: PPO | Admitting: Internal Medicine

## 2018-01-06 VITALS — BP 158/99 | HR 66 | Temp 97.9°F | Resp 16 | Wt 202.6 lb

## 2018-01-06 DIAGNOSIS — Z7982 Long term (current) use of aspirin: Secondary | ICD-10-CM

## 2018-01-06 DIAGNOSIS — R05 Cough: Secondary | ICD-10-CM

## 2018-01-06 DIAGNOSIS — R000 Tachycardia, unspecified: Secondary | ICD-10-CM | POA: Diagnosis not present

## 2018-01-06 DIAGNOSIS — Z87891 Personal history of nicotine dependence: Secondary | ICD-10-CM

## 2018-01-06 DIAGNOSIS — C7802 Secondary malignant neoplasm of left lung: Secondary | ICD-10-CM | POA: Diagnosis not present

## 2018-01-06 DIAGNOSIS — R630 Anorexia: Secondary | ICD-10-CM | POA: Diagnosis not present

## 2018-01-06 DIAGNOSIS — R42 Dizziness and giddiness: Secondary | ICD-10-CM

## 2018-01-06 DIAGNOSIS — J449 Chronic obstructive pulmonary disease, unspecified: Secondary | ICD-10-CM

## 2018-01-06 DIAGNOSIS — Z85528 Personal history of other malignant neoplasm of kidney: Secondary | ICD-10-CM

## 2018-01-06 DIAGNOSIS — I951 Orthostatic hypotension: Secondary | ICD-10-CM

## 2018-01-06 DIAGNOSIS — E039 Hypothyroidism, unspecified: Secondary | ICD-10-CM

## 2018-01-06 DIAGNOSIS — Z905 Acquired absence of kidney: Secondary | ICD-10-CM

## 2018-01-06 DIAGNOSIS — J0110 Acute frontal sinusitis, unspecified: Secondary | ICD-10-CM

## 2018-01-06 DIAGNOSIS — E041 Nontoxic single thyroid nodule: Secondary | ICD-10-CM

## 2018-01-06 DIAGNOSIS — C642 Malignant neoplasm of left kidney, except renal pelvis: Secondary | ICD-10-CM

## 2018-01-06 DIAGNOSIS — Z85820 Personal history of malignant melanoma of skin: Secondary | ICD-10-CM

## 2018-01-06 DIAGNOSIS — D696 Thrombocytopenia, unspecified: Secondary | ICD-10-CM

## 2018-01-06 DIAGNOSIS — Z79899 Other long term (current) drug therapy: Secondary | ICD-10-CM | POA: Diagnosis not present

## 2018-01-06 DIAGNOSIS — Z806 Family history of leukemia: Secondary | ICD-10-CM

## 2018-01-06 DIAGNOSIS — I10 Essential (primary) hypertension: Secondary | ICD-10-CM | POA: Diagnosis not present

## 2018-01-06 DIAGNOSIS — C7801 Secondary malignant neoplasm of right lung: Secondary | ICD-10-CM | POA: Diagnosis not present

## 2018-01-06 DIAGNOSIS — Z87442 Personal history of urinary calculi: Secondary | ICD-10-CM

## 2018-01-06 DIAGNOSIS — I2510 Atherosclerotic heart disease of native coronary artery without angina pectoris: Secondary | ICD-10-CM

## 2018-01-06 MED ORDER — MIDODRINE HCL 5 MG PO TABS: 5.0000 mg | ORAL_TABLET | Freq: Three times a day (TID) | ORAL | 1 refills | Status: DC

## 2018-01-06 NOTE — Patient Instructions (Signed)
#   STOP dexamethasone. # HOLD Norvasc # start Midodrine  # Re- start cymblata 30 mg once a day.  # continue to HOLD sutent.

## 2018-01-06 NOTE — Progress Notes (Signed)
[facility] OFFICE PROGRESS NOTE  Patient Care Team: Dion Body, MD as PCP - General (Family Medicine) Jannet Mantis, MD (Dermatology) Bary Castilla Forest Gleason, MD (General Surgery) Hollice Espy, MD as Consulting Physician (Urology)  No matching staging information was found for the patient.   Oncology History   # MAY- June 2017- METASTATIC CLEAR CELL; LEFT RENAL CA/STAGE IV; [place]- G-3; INTERMEDIATE RISK;  [bil Pul lung nodules;incidental s/p Bx Dr.Byrnett/Dr.Oaks] s/p Cytoreductive nephrectomy; Dr.Brandon- [id number]; July 11th 2017 CT- Enlarging Lung nodules  # Aug 7th 2017- PAZOPANIB; STOP SEP 2017 [pancreatitis/poor tol]  # OCT 1 week- START NIVO q 2W x4; DEC 8th CT- "Progression"; Continue Opdivo;APRIL-MAY 2018- STABLE LUNG NODULES [stopped sec to severe cough; NO Pneumonitis]  # May 31st 2018- Cabo 60mg /day; July 31st CT lung- improved lung nodules; Sep 1st week- cabo- 40mg /day [dose reduced sec to mult intol]; Oct 1st 2week-Cabo 20mg /day;   # NOV 15th 2018- CT chest- slight progression of lung nodules [poor tol to higer doses];   # Nov 28th 2018- SUTENT 50 mg 2w-on  &1 w-OFF  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    At last visit patient was started on steroids for his orthostatic hypotension-however patient wife states patient is more agitated having significant mood swings; snapping of the family often.  This is clearly a change of his baseline.  His dizziness is improved. She denies any new medications.  Denies any headaches.  Denies any chest pain or hemoptysis.  He  denies any addition of any new medications.  Continues to have chronic shortness of breath chronic cough.  Not any worse.  Complains of ongoing fatigue.  REVIEW OF SYSTEMS:  A complete 10 point review of system is done which is negative except mentioned above/history of present illness.   PAST MEDICAL HISTORY :  Past Medical History:  Diagnosis Date  . CAD (coronary artery disease)   . Cancer ([place])    left arm  . COPD (chronic obstructive pulmonary disease) ([place])   . Hypertension   . Kidney stone   . Lung cancer ([place])   . Melanoma ([place]) 01/24/2016   right shoulder  . Thyroid nodule 02/02/2016   left BENIGN THYROID NODULE by FNA    PAST SURGICAL HISTORY :   Past Surgical History:  Procedure Laterality Date  . arm surgery Left    arm  . cardiac stents  2011   Angioplasty / Stenting Femoral-X2  . COLONOSCOPY  04/01/12  . CORONARY ANGIOPLASTY    . EXCISION MELANOMA WITH SENTINEL LYMPH NODE BIOPSY Right 01/24/2016   Procedure: EXCISION MELANOMA Right Shoulder;  Surgeon: Robert Bellow, MD;  Location: [facility] ORS;  Service: General;  Laterality: Right;  . LAPAROSCOPIC NEPHRECTOMY, HAND ASSISTED Left 04/24/2016   Procedure: HAND ASSISTED LAPAROSCOPIC NEPHRECTOMY;  Surgeon: Hollice Espy, MD;  Location: [facility] ORS;  Service: Urology;  Laterality: Left;  Marland Kitchen VIDEO ASSISTED THORACOSCOPY (VATS)/THOROCOTOMY Left 03/08/2016   Procedure: VIDEO ASSISTED THORACOSCOPY (VATS) with lung biopsy - Left ;  Surgeon: Robert Bellow, MD;  Location: [facility] ORS;  Service: General;  Laterality: Left;    FAMILY HISTORY :   Family History  Problem Relation Age of Onset  . Hodgkin's lymphoma Daughter 5  . Kidney cancer Neg Hx   . Kidney disease Neg Hx   . Prostate cancer Neg Hx     SOCIAL HISTORY:   Social History   Tobacco Use  . Smoking status: Former Smoker    Types: Cigarettes    Start date: 01/18/1956    Last attempt to quit: 01/17/1969    Years since quitting: 49.0  . Smokeless tobacco: Never  Used  Substance Use Topics  . Alcohol use: No    Alcohol/week: 0.0 oz    Comment: BEER OCC  . Drug use: No    ALLERGIES:  is allergic to lisinopril and other.  MEDICATIONS:  Current Outpatient Medications  Medication Sig Dispense Refill  . acetaminophen (TYLENOL) 500 MG tablet Take 500 mg by mouth every 6 (six) hours as needed for moderate pain.    Marland Kitchen albuterol (PROVENTIL HFA;VENTOLIN HFA) 108 (90 Base) MCG/ACT inhaler Inhale 2 puffs into the lungs every 6 (six) hours as needed for wheezing or shortness of breath. 1 Inhaler 2  . amoxicillin-clavulanate (AUGMENTIN) 875-125 MG tablet Take 1 tablet by mouth 2 (two) times daily. 20 tablet 0  . aspirin EC 81 MG tablet Take 81 mg by mouth daily.    Marland Kitchen atorvastatin (LIPITOR) 40 MG tablet Take 40 mg by mouth daily.    . busPIRone (BUSPAR) 5 MG tablet Take 2 tablets by mouth daily.    . clopidogrel (PLAVIX) 75 MG tablet Take 75 mg by mouth daily.    Marland Kitchen docusate sodium (COLACE) 100 MG capsule Take 1 capsule (100 mg total) by mouth 2 (two) times daily. 60 capsule 3  . DULoxetine (CYMBALTA) 30 MG capsule Take 2 capsules by mouth daily.    . fluticasone (FLONASE) 50 MCG/ACT nasal spray Place 2 sprays into both nostrils daily. 16 g 2  . Fluticasone-Salmeterol (ADVAIR DISKUS) 500-50 MCG/DOSE AEPB Inhale 1 puff into the lungs 2 (two) times daily. 60 each 11  . ipratropium-albuterol (DUONEB) 0.5-2.5 (3) MG/3ML SOLN Take 3 mLs by nebulization every 4 (four) hours as needed. 360 mL 3  . levofloxacin (LEVAQUIN) 500 MG tablet Take 1 tablet (500 mg total) daily by mouth. 7 tablet 0  . loratadine (CLARITIN) 10 MG tablet Take 10 mg by mouth daily.    Marland Kitchen losartan (COZAAR) 50 MG tablet Take 50 mg by mouth every morning.     . magic mouthwash w/lidocaine SOLN Take 5 mLs by mouth 4 (four) times daily. 480 mL 3  . MELATONIN PO Take 10 mg by mouth at bedtime.     . metoprolol succinate (TOPROL-XL) 50 MG 24 hr tablet Take 50 mg by mouth every morning.     .  nitroGLYCERIN (NITROSTAT) 0.4 MG SL tablet Place 0.4 mg under the tongue every 5 (five) minutes as needed.     . OXYGEN Inhale 3 L into the lungs continuous. bedtime    . Tiotropium Bromide Monohydrate (SPIRIVA RESPIMAT) 1.25 MCG/ACT AERS Inhale 1.25 Act into the lungs 2 (two) times daily. 1 Inhaler 11  . traMADol (ULTRAM) 50 MG tablet Take 1 tablet (50 mg total) by mouth every 6 (six) hours as needed for moderate pain or severe pain. 12 tablet 0  . triamcinolone ointment (KENALOG) 0.5 % Apply 1 application topically 2 (two) times daily. 30 g 0  . amLODipine (NORVASC) 5 MG tablet Take 1 tablet (5  mg total) by mouth 2 (two) times daily. (Patient not taking: Reported on 01/06/2018) 60 tablet 0  . dexamethasone (DECADRON) 2 MG tablet Take one tablet once daily. (Patient not taking: Reported on 01/06/2018) 14 tablet 0  . midodrine (PROAMATINE) 5 MG tablet Take 1 tablet (5 mg total) by mouth 3 (three) times daily with meals. 90 tablet 1  . SUNItinib (SUTENT) 50 MG capsule Take 1 capsule (50 mg total) daily by mouth. Take for 2 weeks ON, then 1 week OFF. Do NOT start until okay by MD. (Patient not taking: Reported on 01/06/2018) 14 capsule 4  . tamsulosin (FLOMAX) 0.4 MG CAPS capsule Take 1 capsule (0.4 mg total) by mouth every morning. 30 capsule 1   No current facility-administered medications for this visit.     PHYSICAL EXAMINATION: ECOG PERFORMANCE STATUS: 0 - Asymptomatic  BP (!) 158/99 (BP Location: Left Arm, Patient Position: Sitting)   Pulse 66   Temp 97.9 F (36.6 C) (Tympanic)   Resp 16   Wt 202 lb 9.6 oz (91.9 kg)   BMI 26.73 kg/m   Filed Weights   01/06/18 1107  Weight: 202 lb 9.6 oz (91.9 kg)    GENERAL: Well-nourished well-developed; Alert, no distress and comfortable.  He is alone.   EYES: no pallor or icterus OROPHARYNX: no thrush or ulceration; NECK: supple, no masses felt LYMPH:  no palpable lymphadenopathy in the cervical, axillary or inguinal regions LUNGS: Decreased  breath sounds to auscultation at the bases.  No wheeze or crackles HEART/CVS: regular rate & rhythm and no murmurs; No lower extremity edema ABDOMEN:abdomen soft, non-tender and normal bowel sounds Musculoskeletal:no cyanosis of digits and no clubbing  PSYCH: alert & oriented x 3 with fluent speech NEURO: no focal motor/sensory deficits SKIN:  Skin rash resolved.  LABORATORY DATA:  I have reviewed the data as listed    Component Value Date/Time   NA 136 12/23/2017 1052   NA 146 (H) 05/25/2016 1514   K 3.9 12/23/2017 1052   CL 99 (L) 12/23/2017 1052   CO2 28 12/23/2017 1052   GLUCOSE 132 (H) 12/23/2017 1052   BUN 17 12/23/2017 1052   BUN 19 05/25/2016 1514   CREATININE 1.31 (H) 12/23/2017 1052   CALCIUM 9.2 12/23/2017 1052   PROT 7.1 12/23/2017 1052   ALBUMIN 4.1 12/23/2017 1052   AST 21 12/23/2017 1052   ALT 14 (L) 12/23/2017 1052   ALKPHOS 84 12/23/2017 1052   BILITOT 1.1 12/23/2017 1052   GFRNONAA 51 (L) 12/23/2017 1052   GFRAA 59 (L) 12/23/2017 1052    No results found for: SPEP, UPEP  Lab Results  Component Value Date   WBC 3.6 (L) 12/23/2017   NEUTROABS 2.1 12/23/2017   HGB 14.1 12/23/2017   HCT 39.9 (L) 12/23/2017   MCV 103.1 (H) 12/23/2017   PLT 123 (L) 12/23/2017      Chemistry      Component Value Date/Time   NA 136 12/23/2017 1052   NA 146 (H) 05/25/2016 1514   K 3.9 12/23/2017 1052   CL 99 (L) 12/23/2017 1052   CO2 28 12/23/2017 1052   BUN 17 12/23/2017 1052   BUN 19 05/25/2016 1514   CREATININE 1.31 (H) 12/23/2017 1052      Component Value Date/Time   CALCIUM 9.2 12/23/2017 1052   ALKPHOS 84 12/23/2017 1052   AST 21 12/23/2017 1052   ALT 14 (L) 12/23/2017 1052   BILITOT 1.1 12/23/2017 1052     RADIOGRAPHIC STUDIES: I  have personally reviewed the radiological images as listed and agreed with the findings in the report. No results found.  ASSESSMENT & PLAN:  Cancer of left kidney excluding renal pelvis ([facility])  # Left kidney cancer  metastatic to the lung-November 15 the CT scan shows-progression of the lung nodules bilateral [by few millimeters].   # currently on sutent [since Nov 28th] 50 mg 2 weeks on 1 week off; currently on hold because of question intolerance/side effects [see discussion below].  #Difficulty with urination-recommend restarting Flomax.  Recommend measures for avoiding orthostatic hypotension.  #Orthostatic hypotension-unclear etiology; improved; recommend Midodrine.  # Mood swings- okay to be back on cymblata 30 mg/day; stop steroids; MRI brain neg.   # difficulty in urination- worse on flomax one a day;  recommend BID;   # [place] care program referral   # follow up in  3 in weeks/CT chest prior. Cont to hold sutent.   # STOP dexamethasone. # HOLD Norvasc # start Midodrine  # Re- start cymblata 30 mg once a day.  # continue to HOLD sutent.  Orders Placed This Encounter  Procedures  . CT CHEST WO CONTRAST    Standing Status:   Future    Standing Expiration Date:   01/06/2019    Order Specific Question:   Preferred imaging location?    Answer:   [facility]    Order Specific Question:   Radiology Contrast Protocol - do NOT remove file path    Answer:   [email or web address]  . CBC with Differential/Platelet    Standing Status:   Future    Standing Expiration Date:   01/06/2019  . Basic metabolic panel    Standing Status:   Future    Standing Expiration Date:   01/06/2019  . AMB REFERRAL TO [facility] CARE    Standing Status:   Future    Standing Expiration Date:   07/07/2019    Referral Priority:   Routine    Referral Type:   Consultation    Number of Visits Requested:   [place], MD 01/15/2018 2:26 PM

## 2018-01-06 NOTE — Assessment & Plan Note (Addendum)
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   # currently on sutent [since Nov 28th] 50 mg 2 weeks on 1 week off; currently on hold because of question intolerance/side effects [see discussion below].  #Difficulty with urination-recommend restarting Flomax.  Recommend measures for avoiding orthostatic hypotension.  #Orthostatic hypotension-unclear etiology; improved; recommend Midodrine.  # Mood swings- okay to be back on cymblata 30 mg/day; stop steroids; MRI brain neg.   # difficulty in urination- worse on flomax one a day;  recommend BID;   # [place] care program referral   # follow up in  3 in weeks/CT chest prior. Cont to hold sutent.   # STOP dexamethasone. # HOLD Norvasc # start Midodrine  # Re- start cymblata 30 mg once a day.  # continue to HOLD sutent.

## 2018-01-15 ENCOUNTER — Telehealth: Payer: Self-pay | Admitting: *Deleted

## 2018-01-15 MED ORDER — TAMSULOSIN HCL 0.4 MG PO CAPS: 0.4000 mg | ORAL_CAPSULE | ORAL | 1 refills | Status: DC

## 2018-01-15 NOTE — Telephone Encounter (Addendum)
Wife called report ing that patient is having frequent urination and only able to go small amt at a time. Asking that we call in something for him. Please advise

## 2018-01-15 NOTE — Telephone Encounter (Signed)
Per Dr. Jacinto Reap, please ask the patient if he is on Flomax. If he is not, please have him take the flomax.

## 2018-01-15 NOTE — Telephone Encounter (Signed)
New prescription sent to pharmacy because he had none on hand.

## 2018-01-17 DIAGNOSIS — J449 Chronic obstructive pulmonary disease, unspecified: Secondary | ICD-10-CM | POA: Diagnosis not present

## 2018-01-17 DIAGNOSIS — C642 Malignant neoplasm of left kidney, except renal pelvis: Secondary | ICD-10-CM | POA: Diagnosis not present

## 2018-01-17 DIAGNOSIS — R05 Cough: Secondary | ICD-10-CM | POA: Diagnosis not present

## 2018-01-17 DIAGNOSIS — R0602 Shortness of breath: Secondary | ICD-10-CM | POA: Diagnosis not present

## 2018-01-24 ENCOUNTER — Telehealth: Payer: Self-pay | Admitting: Internal Medicine

## 2018-01-24 ENCOUNTER — Ambulatory Visit
Admission: RE | Admit: 2018-01-24 | Discharge: 2018-01-24 | Disposition: A | Discharge status: Home / Self Care | Payer: PPO | Source: Ambulatory Visit | Attending: Internal Medicine | Admitting: Internal Medicine

## 2018-01-24 ENCOUNTER — Ambulatory Visit: Payer: PPO | Admitting: Internal Medicine

## 2018-01-24 DIAGNOSIS — J439 Emphysema, unspecified: Secondary | ICD-10-CM | POA: Insufficient documentation

## 2018-01-24 DIAGNOSIS — C642 Malignant neoplasm of left kidney, except renal pelvis: Secondary | ICD-10-CM | POA: Diagnosis not present

## 2018-01-24 DIAGNOSIS — Z8701 Personal history of pneumonia (recurrent): Secondary | ICD-10-CM | POA: Insufficient documentation

## 2018-01-24 DIAGNOSIS — C649 Malignant neoplasm of unspecified kidney, except renal pelvis: Secondary | ICD-10-CM | POA: Diagnosis not present

## 2018-01-24 DIAGNOSIS — E042 Nontoxic multinodular goiter: Secondary | ICD-10-CM | POA: Diagnosis not present

## 2018-01-24 DIAGNOSIS — I700 Atherosclerosis of aorta: Secondary | ICD-10-CM | POA: Insufficient documentation

## 2018-01-24 DIAGNOSIS — C7800 Secondary malignant neoplasm of unspecified lung: Secondary | ICD-10-CM | POA: Insufficient documentation

## 2018-01-24 NOTE — Telephone Encounter (Signed)
Spoke to pt'w wife re: results of the CT scan- progressive cancer; awaiting knee surgery in [place]; awaiting to talk to ortho on 3/11; recommend ortho doc reaching out to me to discuss the surgery given worsening lung findings.

## 2018-01-27 ENCOUNTER — Other Ambulatory Visit: Payer: Self-pay

## 2018-01-27 ENCOUNTER — Encounter: Payer: Self-pay | Admitting: Internal Medicine

## 2018-01-27 ENCOUNTER — Inpatient Hospital Stay: Payer: PPO | Attending: Internal Medicine

## 2018-01-27 ENCOUNTER — Telehealth: Payer: Self-pay | Admitting: Internal Medicine

## 2018-01-27 ENCOUNTER — Ambulatory Visit (INDEPENDENT_AMBULATORY_CARE_PROVIDER_SITE_OTHER): Payer: PPO | Admitting: Physician Assistant

## 2018-01-27 ENCOUNTER — Telehealth: Payer: Self-pay | Admitting: Pharmacist

## 2018-01-27 ENCOUNTER — Inpatient Hospital Stay (HOSPITAL_BASED_OUTPATIENT_CLINIC_OR_DEPARTMENT_OTHER): Payer: PPO | Admitting: Internal Medicine

## 2018-01-27 VITALS — BP 117/75 | HR 75 | Temp 97.8°F | Resp 16 | Ht 73.0 in | Wt 206.8 lb

## 2018-01-27 DIAGNOSIS — Z87442 Personal history of urinary calculi: Secondary | ICD-10-CM | POA: Insufficient documentation

## 2018-01-27 DIAGNOSIS — Z79899 Other long term (current) drug therapy: Secondary | ICD-10-CM

## 2018-01-27 DIAGNOSIS — J449 Chronic obstructive pulmonary disease, unspecified: Secondary | ICD-10-CM

## 2018-01-27 DIAGNOSIS — R51 Headache: Secondary | ICD-10-CM | POA: Insufficient documentation

## 2018-01-27 DIAGNOSIS — E041 Nontoxic single thyroid nodule: Secondary | ICD-10-CM | POA: Insufficient documentation

## 2018-01-27 DIAGNOSIS — C7800 Secondary malignant neoplasm of unspecified lung: Secondary | ICD-10-CM

## 2018-01-27 DIAGNOSIS — I700 Atherosclerosis of aorta: Secondary | ICD-10-CM | POA: Insufficient documentation

## 2018-01-27 DIAGNOSIS — Z87891 Personal history of nicotine dependence: Secondary | ICD-10-CM | POA: Diagnosis not present

## 2018-01-27 DIAGNOSIS — Z9221 Personal history of antineoplastic chemotherapy: Secondary | ICD-10-CM | POA: Diagnosis not present

## 2018-01-27 DIAGNOSIS — I2510 Atherosclerotic heart disease of native coronary artery without angina pectoris: Secondary | ICD-10-CM

## 2018-01-27 DIAGNOSIS — R0602 Shortness of breath: Secondary | ICD-10-CM | POA: Diagnosis not present

## 2018-01-27 DIAGNOSIS — Z923 Personal history of irradiation: Secondary | ICD-10-CM | POA: Diagnosis not present

## 2018-01-27 DIAGNOSIS — R05 Cough: Secondary | ICD-10-CM

## 2018-01-27 DIAGNOSIS — C642 Malignant neoplasm of left kidney, except renal pelvis: Secondary | ICD-10-CM | POA: Insufficient documentation

## 2018-01-27 DIAGNOSIS — I10 Essential (primary) hypertension: Secondary | ICD-10-CM | POA: Insufficient documentation

## 2018-01-27 DIAGNOSIS — Z905 Acquired absence of kidney: Secondary | ICD-10-CM | POA: Insufficient documentation

## 2018-01-27 DIAGNOSIS — Z806 Family history of leukemia: Secondary | ICD-10-CM | POA: Insufficient documentation

## 2018-01-27 DIAGNOSIS — Z7982 Long term (current) use of aspirin: Secondary | ICD-10-CM | POA: Insufficient documentation

## 2018-01-27 DIAGNOSIS — J329 Chronic sinusitis, unspecified: Secondary | ICD-10-CM

## 2018-01-27 DIAGNOSIS — Z85820 Personal history of malignant melanoma of skin: Secondary | ICD-10-CM | POA: Insufficient documentation

## 2018-01-27 LAB — CBC WITH DIFFERENTIAL/PLATELET
BASOS ABS: 0 10*3/uL (ref 0–0.1)
Basophils Relative: 1 %
EOS ABS: 0.1 10*3/uL (ref 0–0.7)
Eosinophils Relative: 1 %
HEMATOCRIT: 39.4 % — AB (ref 40.0–52.0)
HEMOGLOBIN: 14 g/dL (ref 13.0–18.0)
Lymphocytes Relative: 19 %
Lymphs Abs: 1.4 10*3/uL (ref 1.0–3.6)
MCH: 37.5 pg — ABNORMAL HIGH (ref 26.0–34.0)
MCHC: 35.5 g/dL (ref 32.0–36.0)
MCV: 105.6 fL — ABNORMAL HIGH (ref 80.0–100.0)
Monocytes Absolute: 0.8 10*3/uL (ref 0.2–1.0)
Monocytes Relative: 10 %
NEUTROS ABS: 5.4 10*3/uL (ref 1.4–6.5)
NEUTROS PCT: 69 %
Platelets: 177 10*3/uL (ref 150–440)
RBC: 3.73 MIL/uL — AB (ref 4.40–5.90)
RDW: 14.3 % (ref 11.5–14.5)
WBC: 7.7 10*3/uL (ref 3.8–10.6)

## 2018-01-27 LAB — BASIC METABOLIC PANEL
ANION GAP: 10 (ref 5–15)
BUN: 18 mg/dL (ref 6–20)
CALCIUM: 9.4 mg/dL (ref 8.9–10.3)
CO2: 25 mmol/L (ref 22–32)
Chloride: 101 mmol/L (ref 101–111)
Creatinine, Ser: 1.29 mg/dL — ABNORMAL HIGH (ref 0.61–1.24)
GFR, EST NON AFRICAN AMERICAN: 52 mL/min — AB (ref >60)
Glucose, Bld: 137 mg/dL — ABNORMAL HIGH (ref 65–99)
Potassium: 4 mmol/L (ref 3.5–5.1)
SODIUM: 136 mmol/L (ref 135–145)

## 2018-01-27 MED ORDER — TRIAMCINOLONE ACETONIDE 0.5 % EX OINT: 1.0000 "application " | TOPICAL_OINTMENT | Freq: Two times a day (BID) | CUTANEOUS | 2 refills | Status: DC

## 2018-01-27 MED ORDER — AXITINIB 5 MG PO TABS: 5.0000 mg | ORAL_TABLET | Freq: Two times a day (BID) | ORAL | 4 refills | Status: DC

## 2018-01-27 NOTE — Progress Notes (Signed)
[place] OFFICE PROGRESS NOTE  Patient Care Team: Dion Body, MD as PCP - General (Family Medicine) Jannet Mantis, MD (Dermatology) Bary Castilla Forest Gleason, MD (General Surgery) Hollice Espy, MD as Consulting Physician (Urology)  No matching staging information was found for the patient.   Oncology History   # MAY- June 2017- METASTATIC CLEAR CELL; LEFT RENAL CA/STAGE IV; [place]- G-3; INTERMEDIATE RISK;  [bil Pul lung nodules;incidental s/p Bx Dr.Byrnett/Dr.Oaks] s/p Cytoreductive nephrectomy; Dr.Brandon- [id number]; July 11th 2017 CT- Enlarging Lung nodules  # Aug 7th 2017- PAZOPANIB; STOP SEP 2017 [pancreatitis/poor tol]  # OCT 1 week- START NIVO q 2W x4; DEC 8th CT- "Progression"; Continue Opdivo;APRIL-MAY 2018- STABLE LUNG NODULES [stopped sec to severe cough; NO Pneumonitis]  # May 31st 2018- [place] 60mg /day; July 31st CT lung- improved lung nodules; Sep 1st week- cabo- 40mg /day [dose reduced sec to mult intol]; Oct 1st 2week-Cabo 20mg /day;   # NOV 15th 2018- CT chest- slight progression of lung nodules [poor tol to higer doses];   # Nov 28th 2018- SUTENT 50 mg 2w-on  &1 w-OFF;  # MARCH 2019- Progression; start Axitinib 5 mg BID  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  male pleasant patient above history of  renal cell cancer status post left nephrectomy- With metastases to the lung Currently on Sutent 50 mg [since Nov 28th] is here for follow-up/reviewed the results of the restaging CAT scan.  Continues to have chronic shortness of breath chronic cough.  Not any worse.  Complains of ongoing fatigue-not any worse. Denies any chest pain or hemoptysis.  Does complain of chronic sinus pressure/  Complains of headaches.   Complains of postnasal drip.   REVIEW OF SYSTEMS:  A complete 10 point review of system is done which is negative except mentioned above/history of present illness.   PAST MEDICAL HISTORY :  Past Medical History:  Diagnosis Date  . CAD (coronary artery disease)   . Cancer ([place])    left arm  . COPD (chronic obstructive pulmonary disease) ([place])   . Hypertension   . Kidney stone   . Lung cancer ([place])   . Melanoma ([place]) 01/24/2016   right shoulder  . Thyroid nodule 02/02/2016   left BENIGN THYROID NODULE by FNA    PAST SURGICAL HISTORY :   Past Surgical History:  Procedure Laterality Date  . arm surgery Left    arm  . cardiac stents  2011   Angioplasty / Stenting Femoral-X2  . COLONOSCOPY  04/01/12  . CORONARY ANGIOPLASTY    . EXCISION MELANOMA WITH SENTINEL LYMPH NODE BIOPSY Right 01/24/2016   Procedure: EXCISION MELANOMA Right Shoulder;  Surgeon: Robert Bellow, MD;  Location: [facility] ORS;  Service: General;  Laterality: Right;  . LAPAROSCOPIC NEPHRECTOMY, HAND ASSISTED Left 04/24/2016   Procedure: HAND ASSISTED LAPAROSCOPIC NEPHRECTOMY;  Surgeon: Hollice Espy, MD;  Location: [facility] ORS;  Service: Urology;  Laterality: Left;  Marland Kitchen VIDEO ASSISTED THORACOSCOPY (VATS)/THOROCOTOMY Left 03/08/2016   Procedure: VIDEO ASSISTED THORACOSCOPY (VATS) with lung biopsy - Left ;  Surgeon: Robert Bellow, MD;  Location: [facility] ORS;  Service: General;  Laterality: Left;    FAMILY HISTORY :   Family History  Problem Relation Age of Onset  .  Hodgkin's lymphoma Daughter 5  . Kidney cancer Neg Hx   . Kidney disease Neg Hx   . Prostate cancer Neg Hx     SOCIAL HISTORY:   Social History   Tobacco Use  . Smoking status: Former Smoker    Types: Cigarettes    Start date: 01/18/1956    Last attempt to quit: 01/17/1969    Years since quitting: 49.0  . Smokeless tobacco: Never Used  Substance Use Topics  . Alcohol use: No    Alcohol/week: 0.0 oz    Comment: BEER OCC  . Drug use: No     ALLERGIES:  is allergic to lisinopril and other.  MEDICATIONS:  Current Outpatient Medications  Medication Sig Dispense Refill  . acetaminophen (TYLENOL) 500 MG tablet Take 500 mg by mouth every 6 (six) hours as needed for moderate pain.    Marland Kitchen albuterol (PROVENTIL HFA;VENTOLIN HFA) 108 (90 Base) MCG/ACT inhaler Inhale 2 puffs into the lungs every 6 (six) hours as needed for wheezing or shortness of breath. 1 Inhaler 2  . amLODipine (NORVASC) 5 MG tablet Take 1 tablet (5 mg total) by mouth 2 (two) times daily. 60 tablet 0  . aspirin EC 81 MG tablet Take 81 mg by mouth daily.    Marland Kitchen atorvastatin (LIPITOR) 40 MG tablet Take 40 mg by mouth daily.    . busPIRone (BUSPAR) 5 MG tablet Take 2 tablets by mouth daily.    . clopidogrel (PLAVIX) 75 MG tablet Take 75 mg by mouth daily.    Marland Kitchen docusate sodium (COLACE) 100 MG capsule Take 1 capsule (100 mg total) by mouth 2 (two) times daily. 60 capsule 3  . DULoxetine (CYMBALTA) 30 MG capsule Take 2 capsules by mouth daily.    . ferrous sulfate 325 (65 FE) MG EC tablet Take 325 mg by mouth 3 (three) times daily with meals.    . fluticasone (FLONASE) 50 MCG/ACT nasal spray Place 2 sprays into both nostrils daily. 16 g 2  . Fluticasone-Salmeterol (ADVAIR DISKUS) 500-50 MCG/DOSE AEPB Inhale 1 puff into the lungs 2 (two) times daily. 60 each 11  . ipratropium-albuterol (DUONEB) 0.5-2.5 (3) MG/3ML SOLN Take 3 mLs by nebulization every 4 (four) hours as needed. 360 mL 3  . losartan (COZAAR) 50 MG tablet Take 50 mg by mouth every morning.     . magic mouthwash w/lidocaine SOLN Take 5 mLs by mouth 4 (four) times daily. 480 mL 3  . MELATONIN PO Take 10 mg by mouth at bedtime.     . metoprolol succinate (TOPROL-XL) 50 MG 24 hr tablet Take 50 mg by mouth every morning.     . midodrine (PROAMATINE) 5 MG tablet Take 1 tablet (5 mg total) by mouth 3 (three) times daily with meals. 90 tablet 1  . Misc Natural Products (GLUCOSAMINE CHOND COMPLEX/MSM) TABS Take 2 tablets  by mouth daily.    . nitroGLYCERIN (NITROSTAT) 0.4 MG SL tablet Place 0.4 mg under the tongue every 5 (five) minutes as needed.     . Omega-3 Fatty Acids (FISH OIL) 1000 MG CPDR Take 1 capsule by mouth daily.    . OXYGEN Inhale 3 L into the lungs continuous. bedtime    . tamsulosin (FLOMAX) 0.4 MG CAPS capsule Take 1 capsule (0.4 mg total) by mouth every morning. 30 capsule 1  . Tiotropium Bromide Monohydrate (SPIRIVA RESPIMAT) 1.25 MCG/ACT AERS Inhale 1.25 Act into the lungs 2 (two) times daily. 1 Inhaler 11  . traMADol (ULTRAM) 50 MG tablet  Take 1 tablet (50 mg total) by mouth every 6 (six) hours as needed for moderate pain or severe pain. 12 tablet 0  . triamcinolone ointment (KENALOG) 0.5 % Apply 1 application topically 2 (two) times daily. 30 g 2  . axitinib (INLYTA) 5 MG tablet Take 1 tablet (5 mg total) by mouth 2 (two) times daily. 60 tablet 4   No current facility-administered medications for this visit.     PHYSICAL EXAMINATION: ECOG PERFORMANCE STATUS: 0 - Asymptomatic  BP 117/75 (BP Location: Left Arm, Patient Position: Sitting)   Pulse 75   Temp 97.8 F (36.6 C) (Tympanic)   Resp 16   Ht 6\' 1"  (1.854 m)   Wt 206 lb 12.8 oz (93.8 kg)   BMI 27.28 kg/m   Filed Weights   01/27/18 1454  Weight: 206 lb 12.8 oz (93.8 kg)    GENERAL: Well-nourished well-developed; Alert, no distress and comfortable.  He is accompanied by his wife/son and daughter EYES: no pallor or icterus OROPHARYNX: no thrush or ulceration; NECK: supple, no masses felt LYMPH:  no palpable lymphadenopathy in the cervical, axillary or inguinal regions LUNGS: Decreased breath sounds to auscultation at the bases.  No wheeze or crackles HEART/CVS: regular rate & rhythm and no murmurs; No lower extremity edema ABDOMEN:abdomen soft, non-tender and normal bowel sounds Musculoskeletal:no cyanosis of digits and no clubbing  PSYCH: alert & oriented x 3 with fluent speech NEURO: no focal motor/sensory  deficits SKIN:  Skin rash resolved.  LABORATORY DATA:  I have reviewed the data as listed    Component Value Date/Time   NA 136 01/27/2018 1449   NA 146 (H) 05/25/2016 1514   K 4.0 01/27/2018 1449   CL 101 01/27/2018 1449   CO2 25 01/27/2018 1449   GLUCOSE 137 (H) 01/27/2018 1449   BUN 18 01/27/2018 1449   BUN 19 05/25/2016 1514   CREATININE 1.29 (H) 01/27/2018 1449   CALCIUM 9.4 01/27/2018 1449   PROT 7.1 12/23/2017 1052   ALBUMIN 4.1 12/23/2017 1052   AST 21 12/23/2017 1052   ALT 14 (L) 12/23/2017 1052   ALKPHOS 84 12/23/2017 1052   BILITOT 1.1 12/23/2017 1052   GFRNONAA 52 (L) 01/27/2018 1449   GFRAA >60 01/27/2018 1449    No results found for: SPEP, UPEP  Lab Results  Component Value Date   WBC 7.7 01/27/2018   NEUTROABS 5.4 01/27/2018   HGB 14.0 01/27/2018   HCT 39.4 (L) 01/27/2018   MCV 105.6 (H) 01/27/2018   PLT 177 01/27/2018      Chemistry      Component Value Date/Time   NA 136 01/27/2018 1449   NA 146 (H) 05/25/2016 1514   K 4.0 01/27/2018 1449   CL 101 01/27/2018 1449   CO2 25 01/27/2018 1449   BUN 18 01/27/2018 1449   BUN 19 05/25/2016 1514   CREATININE 1.29 (H) 01/27/2018 1449      Component Value Date/Time   CALCIUM 9.4 01/27/2018 1449   ALKPHOS 84 12/23/2017 1052   AST 21 12/23/2017 1052   ALT 14 (L) 12/23/2017 1052   BILITOT 1.1 12/23/2017 1052     RADIOGRAPHIC STUDIES: I have personally reviewed the radiological images as listed and agreed with the findings in the report. No results found.  ASSESSMENT & PLAN:  Cancer of left kidney excluding renal pelvis [facility])  # Left kidney cancer metastatic to the lung [intermediate risk]-most recently on sunitinib; March 2019 CT scan shows progressive lung lesions.   #Patient  has had multiple intolerance to previous TKIs-however will proceed with axitinib 5 mg twice a day.  Discussed the potential side effects of diarrhea hypertension; hand-foot syndrome of the patient and family in detail.  They  understand treatments are palliative.   #Had a long discussion with the patient and family regarding the medial left expectancy of intermediate risk renal cell carcinomas approximately 3 years.  For which patient has done reasonably well; however unfortunately has had multiple adverse events to multiple treatments.  He does understand that the response rates/duration of response will diminish with subsequent length of therapy.  #Chronic cough-improved/stable.  Question etiology.  #Chronic sinusitis headache.  Recommend evaluation with ENT.  #Follow-up in approximately 3 weeks; labs.  Discussed with oral chemo pharmacy.   # I reviewed the blood work- with the patient in detail; also reviewed the imaging independently [as summarized above]; and with the patient in detail.   # 40 minutes face-to-face with the patient discussing the above plan of care; more than 50% of time spent on prognosis/ natural history; counseling and coordination.    Orders Placed This Encounter  Procedures  . CBC with Differential    Standing Status:   Future    Standing Expiration Date:   01/28/2019  . Comprehensive metabolic panel    Standing Status:   Future    Standing Expiration Date:   01/28/2019  . Lactate dehydrogenase    Standing Status:   Future    Standing Expiration Date:   01/28/2019  . TSH    Standing Status:   Future    Standing Expiration Date:   01/28/2019  . Urinalysis, Complete w Microscopic    Standing Status:   Future    Standing Expiration Date:   01/28/2019  . Ambulatory referral to ENT    Referral Priority:   Routine    Referral Type:   Consultation    Referral Reason:   Specialty Services Required    Referred to Provider:   Margaretha Sheffield, MD    Requested Specialty:   Otolaryngology    Number of Visits Requested:   [place], MD 02/02/2018 9:06 AM

## 2018-01-27 NOTE — Telephone Encounter (Signed)
Oral Oncology Patient Advocate Encounter  Patients co-pay for Bartholomew Boards is $822.67. No PA required.   [place] Patient Advocate [phone] 01/27/2018 4:12 PM

## 2018-01-27 NOTE — Assessment & Plan Note (Addendum)
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   #Patient has had multiple intolerance to previous TKIs-however will proceed with axitinib 5 mg twice a day.  Discussed the potential side effects of diarrhea hypertension; hand-foot syndrome of the patient and family in detail.  They understand treatments are palliative.   #Had a long discussion with the patient and family regarding the medial left expectancy of intermediate risk renal cell carcinomas approximately 3 years.  For which patient has done reasonably well; however unfortunately has had multiple adverse events to multiple treatments.  He does understand that the response rates/duration of response will diminish with subsequent length of therapy.  #Chronic cough-improved/stable.  Question etiology.  #Chronic sinusitis headache.  Recommend evaluation with ENT.  #Follow-up in approximately 3 weeks; labs.  Discussed with oral chemo pharmacy.   # I reviewed the blood work- with the patient in detail; also reviewed the imaging independently [as summarized above]; and with the patient in detail.   # 40 minutes face-to-face with the patient discussing the above plan of care; more than 50% of time spent on prognosis/ natural history; counseling and coordination.

## 2018-01-27 NOTE — Telephone Encounter (Signed)
Oral Oncology Pharmacist Encounter  Received new prescription for Inlyta (axitinib) for the treatment of metastatic renal cancer, planned duration until disease progression or unacceptable drug toxicity.  CMP from 12/23/17, BP from today 01/27/18, and Thyroid panel from 01/02/18 assessed, BP controlled, LFTs and thyroid levels wnl. Urine protein from 01/02/18 Prescription dose and frequency assessed.   Current medication list in Epic reviewed, no DDIs with Inlyta (axitinib) identified.  Prescription has been e-scribed to the [facility] for benefits analysis and approval.  Patient education Counseled patient and his family present following his office visit with Dr. Rogue Bussing.  on administration, dosing, side effects, monitoring, drug-food interactions, safe handling, storage, and disposal. Patient will take 1 tablet (5 mg total) by mouth 2 (two) times daily.  Side effects include but not limited to: N/V/D, increased BP, fatigue, decrease in appetite.    Reviewed with patient importance of keeping a medication schedule and plan for any missed doses.  Glen Davis voiced understanding and appreciation. All questions answered.  Oral Oncology Clinic will continue to follow for insurance authorization, copayment issues, and start date.  Provided patient with [facility] [place] phone number. Patient knows to call the office with questions or concerns. Oral Chemotherapy Navigation Clinic will continue to follow.  G[place], PharmD, BCPS Hematology/Oncology Clinical Pharmacist [facility] [place] [facility] [phone]  01/27/2018 4:11 PM

## 2018-01-29 ENCOUNTER — Telehealth: Payer: Self-pay | Admitting: Internal Medicine

## 2018-01-29 MED FILL — INLYTA 5 MG TABLET: 5 | 30 days supply | Qty: 60 | Fill #0

## 2018-01-29 NOTE — Telephone Encounter (Signed)
Oral Chemotherapy Pharmacist Encounter   Called patient's wife Glen Davis and let her know that Dr. Rogue Bussing would like for her husband to get started on the new medication Inlyta as soon as he receives it. She stated her understanding.  Darl Pikes, PharmD, BCPS Hematology/Oncology Clinical Pharmacist [facility] [place] [facility] [phone]  01/29/2018 9:31 AM

## 2018-01-29 NOTE — Telephone Encounter (Signed)
Oral Oncology Patient Advocate Encounter  Sent e-mail to [facility] to please ship out patients Inlyta. Called and spoke with patients wife to let her know we will be shipping out.  Asked if she had any question and she said she was fine.  Told her to call if she needs [place].    [place] Patient Advocate [phone] 01/29/2018 8:01 AM

## 2018-01-31 ENCOUNTER — Telehealth: Payer: Self-pay | Admitting: Internal Medicine

## 2018-01-31 NOTE — Telephone Encounter (Signed)
Oral Oncology Patient Advocate Encounter  Patients medication was shipped 01/30/2018 from [facility].   [place] Patient Advocate [phone] 01/31/2018 11:12 AM

## 2018-02-03 ENCOUNTER — Telehealth: Payer: Self-pay | Admitting: *Deleted

## 2018-02-03 ENCOUNTER — Telehealth: Payer: Self-pay | Admitting: Internal Medicine

## 2018-02-03 DIAGNOSIS — C7800 Secondary malignant neoplasm of unspecified lung: Secondary | ICD-10-CM | POA: Diagnosis not present

## 2018-02-03 DIAGNOSIS — I10 Essential (primary) hypertension: Secondary | ICD-10-CM | POA: Diagnosis not present

## 2018-02-03 DIAGNOSIS — I2510 Atherosclerotic heart disease of native coronary artery without angina pectoris: Secondary | ICD-10-CM | POA: Diagnosis not present

## 2018-02-03 DIAGNOSIS — E781 Pure hyperglyceridemia: Secondary | ICD-10-CM | POA: Diagnosis not present

## 2018-02-03 NOTE — Telephone Encounter (Signed)
Glen Davis called to report that Glen Davis b/p has been very high since starting the new chemotherapy and would like to speak with Glen Davis regarding this. Please return her call [phone]

## 2018-02-03 NOTE — Telephone Encounter (Signed)
I tried to contact patient and pt's wife. Could not leave vm.Marland Kitchen

## 2018-02-03 NOTE — Telephone Encounter (Signed)
Cannot each pt/wife at the given number; also cannot leave a message.

## 2018-02-04 ENCOUNTER — Telehealth: Payer: Self-pay | Admitting: Internal Medicine

## 2018-02-04 NOTE — Telephone Encounter (Signed)
Spoke to patient's wife blood pressures ranging 366-294 systolic.  Recommend holding axitinib at this time; discontinue Midodrine.  Follow-up with nurse practitioner on 3/22 regarding restarting of axitinib.

## 2018-02-04 NOTE — Telephone Encounter (Signed)
Apt made with Sonia Baller, NP on Friday at 47. Wife given this apt

## 2018-02-06 ENCOUNTER — Ambulatory Visit: Payer: PPO | Admitting: General Surgery

## 2018-02-07 ENCOUNTER — Inpatient Hospital Stay (HOSPITAL_BASED_OUTPATIENT_CLINIC_OR_DEPARTMENT_OTHER): Payer: PPO | Admitting: Oncology

## 2018-02-07 ENCOUNTER — Encounter: Payer: Self-pay | Admitting: Oncology

## 2018-02-07 VITALS — BP 118/75 | HR 68 | Temp 97.4°F | Resp 16 | Wt 209.0 lb

## 2018-02-07 DIAGNOSIS — Z87442 Personal history of urinary calculi: Secondary | ICD-10-CM

## 2018-02-07 DIAGNOSIS — J449 Chronic obstructive pulmonary disease, unspecified: Secondary | ICD-10-CM

## 2018-02-07 DIAGNOSIS — Z9221 Personal history of antineoplastic chemotherapy: Secondary | ICD-10-CM

## 2018-02-07 DIAGNOSIS — Z905 Acquired absence of kidney: Secondary | ICD-10-CM

## 2018-02-07 DIAGNOSIS — R51 Headache: Secondary | ICD-10-CM

## 2018-02-07 DIAGNOSIS — Z85820 Personal history of malignant melanoma of skin: Secondary | ICD-10-CM

## 2018-02-07 DIAGNOSIS — Z79899 Other long term (current) drug therapy: Secondary | ICD-10-CM

## 2018-02-07 DIAGNOSIS — I158 Other secondary hypertension: Secondary | ICD-10-CM

## 2018-02-07 DIAGNOSIS — Z806 Family history of leukemia: Secondary | ICD-10-CM

## 2018-02-07 DIAGNOSIS — C642 Malignant neoplasm of left kidney, except renal pelvis: Secondary | ICD-10-CM | POA: Diagnosis not present

## 2018-02-07 DIAGNOSIS — C7800 Secondary malignant neoplasm of unspecified lung: Secondary | ICD-10-CM | POA: Diagnosis not present

## 2018-02-07 DIAGNOSIS — I2510 Atherosclerotic heart disease of native coronary artery without angina pectoris: Secondary | ICD-10-CM

## 2018-02-07 DIAGNOSIS — Z7982 Long term (current) use of aspirin: Secondary | ICD-10-CM | POA: Diagnosis not present

## 2018-02-07 DIAGNOSIS — T50905A Adverse effect of unspecified drugs, medicaments and biological substances, initial encounter: Principal | ICD-10-CM

## 2018-02-07 DIAGNOSIS — R0602 Shortness of breath: Secondary | ICD-10-CM | POA: Diagnosis not present

## 2018-02-07 DIAGNOSIS — Z87891 Personal history of nicotine dependence: Secondary | ICD-10-CM

## 2018-02-07 DIAGNOSIS — R05 Cough: Secondary | ICD-10-CM

## 2018-02-07 DIAGNOSIS — E041 Nontoxic single thyroid nodule: Secondary | ICD-10-CM

## 2018-02-07 DIAGNOSIS — I10 Essential (primary) hypertension: Secondary | ICD-10-CM

## 2018-02-07 DIAGNOSIS — I700 Atherosclerosis of aorta: Secondary | ICD-10-CM

## 2018-02-07 DIAGNOSIS — Z923 Personal history of irradiation: Secondary | ICD-10-CM

## 2018-02-07 NOTE — Progress Notes (Signed)
Patient here for acute add on to evaluate some abnormal blood pressure readings while on Inlyta.

## 2018-02-07 NOTE — Progress Notes (Signed)
Symptom Management Consult note [facility]  Telephone:([phone] [phone]  Patient Care Team: Dion Body, MD as PCP - General (Family Medicine) Jannet Mantis, MD (Dermatology) Bary Castilla, Forest Gleason, MD (General Surgery) Hollice Espy, MD as Consulting Physician (Urology)   Name of the patient: Glen Davis  [id number]  14-Jan-1941   Date of visit: 02/07/18  Diagnosis-  Metastatic clear-cell left renal stage IV  Chief complaint/ Reason for visit- Hypertension  Heme/Onc history: Patient was last seen by primary oncologist, Dr. Rogue Bussing on 01/27/2018 for assessment and review of restaging CT scans. At that time he continued to have chronic shortness of breath and chronic cough. This was noted not to be any worse. He complains of ongoing fatigue but denied chest pain or hemoptysis. Complained of chronic sinus pressure and chronic headaches and postnasal drip. Most recent CT scan from March 2019 shows progression of lung lesions. He was to follow-up in approximately 3 weeks with labs and started on Inlyta 5 mg BID.   Interval history-  Patient is here for evaluation of elevated blood pressures.   Onset of elevated blood pressure: 01/31/18 day he received/started prescription of 5 mg Inlyta.  Cardiac symptoms dyspnea. Patient denies chest pain, irregular heart beat and syncope.  Cardiovascular risk factors: hypertension.  Use of agents associated with hypertension: 5 mg Inlyta BID and midodrine 5 mg .  History of target organ damage: none.  ECOG FS:1 - Symptomatic but completely ambulatory  Review of systems- Review of Systems  Constitutional: Negative.  Negative for chills, fever, malaise/fatigue and weight loss.  HENT: Negative for congestion and ear pain.   Eyes: Negative.  Negative for blurred vision and double vision.  Respiratory: Positive for cough and shortness of breath (Chronic). Negative for sputum production.     Cardiovascular: Negative.  Negative for chest pain, palpitations and leg swelling.  Gastrointestinal: Negative.  Negative for abdominal pain, constipation, diarrhea, nausea and vomiting.  Genitourinary: Negative for dysuria, frequency and urgency.  Musculoskeletal: Negative for back pain and falls.  Skin: Negative.  Negative for rash.  Neurological: Negative.  Negative for weakness and headaches.  Endo/Heme/Allergies: Negative.  Does not bruise/bleed easily.  Psychiatric/Behavioral: Negative.  Negative for depression. The patient is not nervous/anxious and does not have insomnia.      Current treatment- 5 mg Inlyta- Started on 01/31/18- stopped due to hypertension on 02/04/18.   Allergies  Allergen Reactions  . Lisinopril Swelling  . Other Itching    Nitroglycerin Patch.     Past Medical History:  Diagnosis Date  . CAD (coronary artery disease)   . Cancer ([place])    left arm  . COPD (chronic obstructive pulmonary disease) ([place])   . Hypertension   . Kidney stone   . Lung cancer ([place])   . Melanoma ([place]) 01/24/2016   right shoulder  . Thyroid nodule 02/02/2016   left BENIGN THYROID NODULE by FNA     Past Surgical History:  Procedure Laterality Date  . arm surgery Left    arm  . cardiac stents  2011   Angioplasty / Stenting Femoral-X2  . COLONOSCOPY  04/01/12  . CORONARY ANGIOPLASTY    . EXCISION MELANOMA WITH SENTINEL LYMPH NODE BIOPSY Right 01/24/2016   Procedure: EXCISION MELANOMA Right Shoulder;  Surgeon: Robert Bellow, MD;  Location: [facility] ORS;  Service: General;  Laterality: Right;  . LAPAROSCOPIC NEPHRECTOMY, HAND ASSISTED Left 04/24/2016   Procedure: HAND ASSISTED LAPAROSCOPIC NEPHRECTOMY;  Surgeon: Hollice Espy, MD;  Location: [facility] ORS;  Service: Urology;  Laterality: Left;  Marland Kitchen VIDEO ASSISTED THORACOSCOPY (VATS)/THOROCOTOMY Left 03/08/2016   Procedure: VIDEO ASSISTED THORACOSCOPY (VATS) with lung biopsy - Left ;  Surgeon: Robert Bellow, MD;  Location: [facility] ORS;   Service: General;  Laterality: Left;    Social History   Socioeconomic History  . Marital status: Married    Spouse name: Not on file  . Number of children: Not on file  . Years of education: Not on file  . Highest education level: Not on file  Occupational History  . Not on file  Social Needs  . Financial resource strain: Not on file  . Food insecurity:    Worry: Not on file    Inability: Not on file  . Transportation needs:    Medical: Not on file    Non-medical: Not on file  Tobacco Use  . Smoking status: Former Smoker    Types: Cigarettes    Start date: 01/18/1956    Last attempt to quit: 01/17/1969    Years since quitting: 49.0  . Smokeless tobacco: Never Used  Substance and Sexual Activity  . Alcohol use: No    Alcohol/week: 0.0 oz    Comment: BEER OCC  . Drug use: No  . Sexual activity: Not on file  Lifestyle  . Physical activity:    Days per week: Not on file    Minutes per session: Not on file  . Stress: Not on file  Relationships  . Social connections:    Talks on phone: Not on file    Gets together: Not on file    Attends religious service: Not on file    Active member of club or organization: Not on file    Attends meetings of clubs or organizations: Not on file    Relationship status: Not on file  . Intimate partner violence:    Fear of current or ex partner: Not on file    Emotionally abused: Not on file    Physically abused: Not on file    Forced sexual activity: Not on file  Other Topics Concern  . Not on file  Social History Narrative  . Not on file    Family History  Problem Relation Age of Onset  . Hodgkin's lymphoma Daughter 5  . Kidney cancer Neg Hx   . Kidney disease Neg Hx   . Prostate cancer Neg Hx      Current Outpatient Medications:  .  acetaminophen (TYLENOL) 500 MG tablet, Take 500 mg by mouth every 6 (six) hours as needed for moderate pain., Disp: , Rfl:  .  albuterol (PROVENTIL HFA;VENTOLIN HFA) 108 (90 Base) MCG/ACT  inhaler, Inhale 2 puffs into the lungs every 6 (six) hours as needed for wheezing or shortness of breath., Disp: 1 Inhaler, Rfl: 2 .  amLODipine (NORVASC) 5 MG tablet, Take 1 tablet (5 mg total) by mouth 2 (two) times daily., Disp: 60 tablet, Rfl: 0 .  aspirin EC 81 MG tablet, Take 81 mg by mouth daily., Disp: , Rfl:  .  atorvastatin (LIPITOR) 40 MG tablet, Take 40 mg by mouth daily., Disp: , Rfl:  .  busPIRone (BUSPAR) 5 MG tablet, Take 2 tablets by mouth daily., Disp: , Rfl:  .  clopidogrel (PLAVIX) 75 MG tablet, Take 75 mg by mouth daily., Disp: , Rfl:  .  docusate sodium (COLACE) 100 MG capsule, Take 1 capsule (100 mg total) by mouth 2 (two) times daily., Disp: 60 capsule, Rfl: 3 .  DULoxetine (CYMBALTA) 30 MG capsule, Take 2 capsules by mouth daily., Disp: , Rfl:  .  ferrous sulfate 325 (65 FE) MG EC tablet, Take 325 mg by mouth 3 (three) times daily with meals., Disp: , Rfl:  .  fluticasone (FLONASE) 50 MCG/ACT nasal spray, Place 2 sprays into both nostrils daily., Disp: 16 g, Rfl: 2 .  Fluticasone-Salmeterol (ADVAIR DISKUS) 500-50 MCG/DOSE AEPB, Inhale 1 puff into the lungs 2 (two) times daily., Disp: 60 each, Rfl: 11 .  ipratropium-albuterol (DUONEB) 0.5-2.5 (3) MG/3ML SOLN, Take 3 mLs by nebulization every 4 (four) hours as needed., Disp: 360 mL, Rfl: 3 .  losartan (COZAAR) 50 MG tablet, Take 50 mg by mouth every morning. , Disp: , Rfl:  .  MELATONIN PO, Take 10 mg by mouth at bedtime. , Disp: , Rfl:  .  metoprolol succinate (TOPROL-XL) 50 MG 24 hr tablet, Take 50 mg by mouth every morning. , Disp: , Rfl:  .  midodrine (PROAMATINE) 5 MG tablet, Take 1 tablet (5 mg total) by mouth 3 (three) times daily with meals., Disp: 90 tablet, Rfl: 1 .  Misc Natural Products (GLUCOSAMINE CHOND COMPLEX/MSM) TABS, Take 2 tablets by mouth daily., Disp: , Rfl:  .  nitroGLYCERIN (NITROSTAT) 0.4 MG SL tablet, Place 0.4 mg under the tongue every 5 (five) minutes as needed. , Disp: , Rfl:  .  Omega-3 Fatty  Acids (FISH OIL) 1000 MG CPDR, Take 1 capsule by mouth daily., Disp: , Rfl:  .  OXYGEN, Inhale 3 L into the lungs continuous. bedtime, Disp: , Rfl:  .  tamsulosin (FLOMAX) 0.4 MG CAPS capsule, Take 1 capsule (0.4 mg total) by mouth every morning., Disp: 30 capsule, Rfl: 1 .  Tiotropium Bromide Monohydrate (SPIRIVA RESPIMAT) 1.25 MCG/ACT AERS, Inhale 1.25 Act into the lungs 2 (two) times daily., Disp: 1 Inhaler, Rfl: 11 .  traMADol (ULTRAM) 50 MG tablet, Take 1 tablet (50 mg total) by mouth every 6 (six) hours as needed for moderate pain or severe pain., Disp: 12 tablet, Rfl: 0 .  triamcinolone ointment (KENALOG) 0.5 %, Apply 1 application topically 2 (two) times daily., Disp: 30 g, Rfl: 2 .  axitinib (INLYTA) 5 MG tablet, Take 1 tablet (5 mg total) by mouth 2 (two) times daily. (Patient not taking: Reported on 02/07/2018), Disp: 60 tablet, Rfl: 4 .  magic mouthwash w/lidocaine SOLN, Take 5 mLs by mouth 4 (four) times daily. (Patient not taking: Reported on 02/07/2018), Disp: 480 mL, Rfl: 3  Physical exam:  Vitals:   02/07/18 1054  BP: 118/75  Pulse: 68  Resp: 16  Temp: (!) 97.4 F (36.3 C)  TempSrc: Tympanic  SpO2: 96%  Weight: 209 lb (94.8 kg)   Physical Exam  Constitutional: He is oriented to person, place, and time and well-developed, well-nourished, and in no distress. Vital signs are normal.  HENT:  Head: Normocephalic and atraumatic.  Eyes: Pupils are equal, round, and reactive to light.  Neck: Normal range of motion.  Cardiovascular: Normal rate, regular rhythm and normal heart sounds.  No murmur heard. Pulmonary/Chest: Effort normal and breath sounds normal. He has no wheezes.  Abdominal: Soft. Normal appearance and bowel sounds are normal. He exhibits no distension. There is no tenderness.  Musculoskeletal: Normal range of motion. He exhibits no edema.  Neurological: He is alert and oriented to person, place, and time. Gait normal.  Skin: Skin is warm and dry. No rash noted.    Psychiatric: Mood, memory, affect and judgment normal.  CMP Latest Ref Rng & Units 01/27/2018  Glucose 65 - 99 mg/dL 137(H)  BUN 6 - 20 mg/dL 18  Creatinine 0.61 - 1.24 mg/dL 1.29(H)  Sodium 135 - 145 mmol/L 136  Potassium 3.5 - 5.1 mmol/L 4.0  Chloride 101 - 111 mmol/L 101  CO2 22 - 32 mmol/L 25  Calcium 8.9 - 10.3 mg/dL 9.4  Total Protein 6.5 - 8.1 g/dL -  Total Bilirubin 0.3 - 1.2 mg/dL -  Alkaline Phos 38 - 126 U/L -  AST 15 - 41 U/L -  ALT 17 - 63 U/L -   CBC Latest Ref Rng & Units 01/27/2018  WBC 3.8 - 10.6 K/uL 7.7  Hemoglobin 13.0 - 18.0 g/dL 14.0  Hematocrit 40.0 - 52.0 % 39.4(L)  Platelets 150 - 440 K/uL 177    No images are attached to the encounter.  Ct Chest Wo Contrast  Result Date: 01/24/2018 CLINICAL DATA:  Metastatic renal cell carcinoma. History of melanoma. EXAM: CT CHEST WITHOUT CONTRAST TECHNIQUE: Multidetector CT imaging of the chest was performed following the standard protocol without IV contrast. COMPARISON:  CT 10/03/2017 and 06/18/2017. FINDINGS: Cardiovascular: Coronary artery atherosclerosis with possible left anterior descending stent. Mild aortic and great vessel atherosclerosis with stable mild dilatation of the ascending aorta to 4.2 cm. No acute vascular findings on noncontrast imaging. The heart size is normal. There is no pericardial effusion. Mediastinum/Nodes: There are no enlarged mediastinal, hilar or axillary lymph nodes.8 mm prevascular node on image 42/2 has slightly enlarged compared with the prior study. Subcarinal node measuring 8 mm on image 69 appears slightly smaller. There are bilateral thyroid nodules, measuring up to 15 mm on the right (image 2/2) and 16 mm on the left (13/2). These are grossly stable from recent prior studies, although the nodule on the right appears enlarged from 2017. The esophagus and trachea appear unremarkable. Lungs/Pleura: There is no pleural effusion. There is progressive metastatic disease with multiple  enlarging and new pulmonary nodules bilaterally. Index lesions include a left upper lobe nodule measuring 15 x 20 mm on image 26/3 (previously 15 x 11 mm), and right lower lobe lesions measuring 16 x 16 mm on image 108/3 (previously 12 x 9 mm) and 23 x 14 mm on image 116/3 (previously 16 x 12 mm). Previously demonstrated left lower lobe airspace disease has resolved. There is moderate centrilobular emphysema. Upper abdomen: The visualized upper abdomen appears stable without definite signs metastatic disease on noncontrast imaging. There is no adrenal mass. Musculoskeletal/Chest wall: There is no chest wall mass or suspicious osseous finding. IMPRESSION: 1. Progressive widespread pulmonary metastatic disease. 2. No definite extra pulmonary metastatic disease. A small lymph node in the pre-vascular space has mildly enlarged, although there are no enlarged mediastinal or hilar lymph nodes. 3. Bilateral thyroid nodules, enlarged on the right from 2017. In light of the patient's metastatic disease, these are of questionable significance. Follow-up ultrasound could be performed if clinically warranted. 4. Interval resolution of left lower lobe pneumonia. 5. Aortic Atherosclerosis (ICD10-I70.0) and Emphysema (ICD10-J43.9). Electronically Signed   By: Richardean Sale M.D.   On: 01/24/2018 11:18     Assessment and plan- Patient is a 77 y.o. male he presents for symptom management for blood pressure check.  1. Metastatic clear-cell left renal stage IV: Previously on Sutent 50 mg since November 2018. Noted progression on recent CT scan in March 2019. Started inlyta 5 mg twice a day on 01/31/2018. Wife called on 02/03/2018 noting an increase in blood pressures  as high as 680-881 systolically. Patient was previously treated for orthostatic hypotension with Midodrine 5 mg TID prescribed by Dr. Rogue Bussing on 01/06/2018. Amlodipine was stopped by Cardiology for hypotension as well. Patient has a history of hypertension and  is on several different blood pressure medications including metoprolol 50 mg daily, losartan 50 mg daily and Norvasc 5 mg tablet by mouth BID. He is currently taking BP meds.  On 02/03/2018 he was seen by his cardiologist with [facility] [facility] for elevated blood pressure and dizziness with occasional headaches. He was instructed to take additional metoprolol 25 mg as needed when his systolic blood pressures greater than 103 and diastolic blood pressure greater than 90.  On 02/03/18, patients wife called clinic regarding elevated BP levels. On 02/04/18, Dr. Rogue Bussing asked that patient stop taking Inlyta and Midodrine and make an appt to be seen by NP on 02/07/18 for blood pressure re-check.  He is feeling "back to baseline" today. BP is normal. Negative orthostatics in clinic today.   Per Dr. Cleotis Nipper note and after spoke to oral chemo pharmacist Clearnce Sorrel), we agreed to resume 5 mg Inlyta BID and to continue to HOLD the Midodrine. Per up to date, if he becomes hypertensive, we can dose reduce Inlyta to 3 mg for trial. He is told to continue to keep a BP dirary and I will call and check on him Monday. If he becomes hypertensive over the weekend he is to stop Inlyta completely. Patient and wife in agreeance with plan.   If he becomes hypertensive and is unable to bring it down, he is told to report to the emergency room immediately. He is scheduled to see his Cardiologist on Monday for follow-up.     Visit Diagnosis 1. Hypertension due to drug     Patient expressed understanding and was in agreement with this plan. He also understands that He can call clinic at any time with any questions, concerns, or complaints.   Greater than 50% was spent in counseling and coordination of care with this patient including but not limited to discussion of the relevant topics above (See A&P) including, but not limited to diagnosis and management of acute and chronic medical conditions.    Faythe Casa, AGNP-C [facility]- [phone] Pager- [phone] 02/07/2018 2:00 PM

## 2018-02-10 ENCOUNTER — Telehealth: Payer: Self-pay | Admitting: *Deleted

## 2018-02-10 NOTE — Telephone Encounter (Signed)
Spoke to Dr. Rogue Bussing after receiving recorded blood pressures from patients wife. Patient is to try 5 mg daily instead of twice a day and see if this improves his blood pressures. I reiterated to take metoprolol as directed by cardiologist when blood pressures are greater than 800 systolic and 90 diastolic. [name], patients wife understands and to continue blood pressure diary. For now,  keep scheduled appointment with Dr. Rogue Bussing.

## 2018-02-10 NOTE — Telephone Encounter (Signed)
Wife called asking what is next since his medicine was stopped due to HTN. She asks for a return call [phone]

## 2018-02-10 NOTE — Telephone Encounter (Signed)
Spoke with [name] (pt's wife).  Patient started back on the Inlyta on Friday.  He had 1 Inlyta dose on Friday after being seen in the clinic; 2 doses on Saturday and 2 doses on Sunday as directed.  Bartholomew Boards was held today out of concerns of BP.  Orthostatic BPs were as follows before taking blood pressure.  Saturday am BPs Laying down- 186/148 sitting - 145/95 standing-102/78   Sunday am BPs Laying down-160/92 sitting 146/90 standing-148-92  Monday am BP's Laying down-148/90 sitting-139/90 standing - 114/79

## 2018-02-14 ENCOUNTER — Other Ambulatory Visit: Payer: Self-pay | Admitting: *Deleted

## 2018-02-14 DIAGNOSIS — C642 Malignant neoplasm of left kidney, except renal pelvis: Secondary | ICD-10-CM

## 2018-02-17 ENCOUNTER — Inpatient Hospital Stay (HOSPITAL_BASED_OUTPATIENT_CLINIC_OR_DEPARTMENT_OTHER): Payer: PPO | Admitting: Internal Medicine

## 2018-02-17 ENCOUNTER — Inpatient Hospital Stay: Payer: PPO | Attending: Hematology and Oncology

## 2018-02-17 ENCOUNTER — Other Ambulatory Visit: Payer: Self-pay

## 2018-02-17 VITALS — BP 139/89 | HR 58 | Temp 97.9°F | Resp 18 | Ht 73.0 in | Wt 208.0 lb

## 2018-02-17 DIAGNOSIS — Z87442 Personal history of urinary calculi: Secondary | ICD-10-CM | POA: Insufficient documentation

## 2018-02-17 DIAGNOSIS — C642 Malignant neoplasm of left kidney, except renal pelvis: Secondary | ICD-10-CM

## 2018-02-17 DIAGNOSIS — R05 Cough: Secondary | ICD-10-CM

## 2018-02-17 DIAGNOSIS — J449 Chronic obstructive pulmonary disease, unspecified: Secondary | ICD-10-CM | POA: Diagnosis not present

## 2018-02-17 DIAGNOSIS — C7800 Secondary malignant neoplasm of unspecified lung: Secondary | ICD-10-CM | POA: Insufficient documentation

## 2018-02-17 DIAGNOSIS — I2510 Atherosclerotic heart disease of native coronary artery without angina pectoris: Secondary | ICD-10-CM

## 2018-02-17 DIAGNOSIS — I10 Essential (primary) hypertension: Secondary | ICD-10-CM | POA: Insufficient documentation

## 2018-02-17 DIAGNOSIS — Z905 Acquired absence of kidney: Secondary | ICD-10-CM | POA: Insufficient documentation

## 2018-02-17 DIAGNOSIS — Z85820 Personal history of malignant melanoma of skin: Secondary | ICD-10-CM | POA: Diagnosis not present

## 2018-02-17 DIAGNOSIS — Z9221 Personal history of antineoplastic chemotherapy: Secondary | ICD-10-CM | POA: Insufficient documentation

## 2018-02-17 DIAGNOSIS — M542 Cervicalgia: Secondary | ICD-10-CM | POA: Insufficient documentation

## 2018-02-17 DIAGNOSIS — Z87891 Personal history of nicotine dependence: Secondary | ICD-10-CM | POA: Insufficient documentation

## 2018-02-17 DIAGNOSIS — R0602 Shortness of breath: Secondary | ICD-10-CM | POA: Diagnosis not present

## 2018-02-17 DIAGNOSIS — E079 Disorder of thyroid, unspecified: Secondary | ICD-10-CM

## 2018-02-17 LAB — COMPREHENSIVE METABOLIC PANEL
ALT: 16 U/L — AB (ref 17–63)
AST: 20 U/L (ref 15–41)
Albumin: 4.3 g/dL (ref 3.5–5.0)
Alkaline Phosphatase: 104 U/L (ref 38–126)
Anion gap: 9 (ref 5–15)
BILIRUBIN TOTAL: 1 mg/dL (ref 0.3–1.2)
BUN: 17 mg/dL (ref 6–20)
CO2: 26 mmol/L (ref 22–32)
CREATININE: 1.21 mg/dL (ref 0.61–1.24)
Calcium: 9.8 mg/dL (ref 8.9–10.3)
Chloride: 99 mmol/L — ABNORMAL LOW (ref 101–111)
GFR calc Af Amer: >60 mL/min (ref >60)
GFR, EST NON AFRICAN AMERICAN: 56 mL/min — AB (ref >60)
Glucose, Bld: 118 mg/dL — ABNORMAL HIGH (ref 65–99)
Potassium: 4.3 mmol/L (ref 3.5–5.1)
Sodium: 134 mmol/L — ABNORMAL LOW (ref 135–145)
TOTAL PROTEIN: 7.6 g/dL (ref 6.5–8.1)

## 2018-02-17 LAB — URINALYSIS, COMPLETE (UACMP) WITH MICROSCOPIC
Bacteria, UA: NONE SEEN
Bilirubin Urine: NEGATIVE
GLUCOSE, UA: NEGATIVE mg/dL
Ketones, ur: NEGATIVE mg/dL
Leukocytes, UA: NEGATIVE
Nitrite: NEGATIVE
PH: 5 (ref 5.0–8.0)
Protein, ur: NEGATIVE mg/dL
SPECIFIC GRAVITY, URINE: 1.006 (ref 1.005–1.030)
WBC, UA: NONE SEEN WBC/hpf (ref 0–5)

## 2018-02-17 LAB — CBC WITH DIFFERENTIAL/PLATELET
BASOS ABS: 0 10*3/uL (ref 0–0.1)
Basophils Relative: 1 %
EOS ABS: 0.1 10*3/uL (ref 0–0.7)
EOS PCT: 2 %
HCT: 45.3 % (ref 39.0–52.0)
Hemoglobin: 16.4 g/dL (ref 13.0–17.0)
Lymphocytes Relative: 28 %
Lymphs Abs: 1.9 10*3/uL (ref 1.0–3.6)
MCH: 36.1 pg — AB (ref 26.0–34.0)
MCHC: 36.2 g/dL — ABNORMAL HIGH (ref 32.0–36.0)
MCV: 99.8 fL (ref 80.0–100.0)
MONO ABS: 0.5 10*3/uL (ref 0.2–1.0)
Monocytes Relative: 8 %
Neutro Abs: 4.2 10*3/uL (ref 1.4–6.5)
Neutrophils Relative %: 61 %
PLATELETS: 158 10*3/uL (ref 150–440)
RBC: 4.54 MIL/uL (ref 4.40–5.90)
RDW: 13.3 % (ref 11.5–14.5)
WBC: 6.7 10*3/uL (ref 3.8–10.6)

## 2018-02-17 LAB — LACTATE DEHYDROGENASE: LDH: 161 U/L (ref 98–192)

## 2018-02-17 LAB — TSH: TSH: 4.75 u[IU]/mL — ABNORMAL HIGH (ref 0.350–4.500)

## 2018-02-17 MED ORDER — ALBUTEROL SULFATE HFA 108 (90 BASE) MCG/ACT IN AERS: 2.0000 | INHALATION_SPRAY | Freq: Four times a day (QID) | RESPIRATORY_TRACT | 2 refills | Status: AC | PRN

## 2018-02-17 MED ORDER — FLUTICASONE-SALMETEROL 500-50 MCG/DOSE IN AEPB: 1.0000 | INHALATION_SPRAY | Freq: Two times a day (BID) | RESPIRATORY_TRACT | 11 refills | Status: DC

## 2018-02-17 MED ORDER — TIOTROPIUM BROMIDE MONOHYDRATE 1.25 MCG/ACT IN AERS: 1.2500 | INHALATION_SPRAY | Freq: Two times a day (BID) | RESPIRATORY_TRACT | 11 refills | Status: AC

## 2018-02-17 NOTE — Progress Notes (Signed)
[facility] OFFICE PROGRESS NOTE  Patient Care Team: Dion Body, MD as PCP - General (Family Medicine) Jannet Mantis, MD (Dermatology) Bary Castilla Forest Gleason, MD (General Surgery) Hollice Espy, MD as Consulting Physician (Urology)  No matching staging information was found for the patient.   Oncology History   # MAY- June 2017- METASTATIC CLEAR CELL; LEFT RENAL CA/STAGE IV; Furhman- G-3; INTERMEDIATE RISK;  [bil Pul lung nodules;incidental s/p Bx Dr.Byrnett/Dr.Oaks] s/p Cytoreductive nephrectomy; Dr.Brandon- [id number]; July 11th 2017 CT- Enlarging Lung nodules  # Aug 7th 2017- PAZOPANIB; STOP SEP 2017 [pancreatitis/poor tol]  # OCT 1 week- START NIVO q 2W x4; DEC 8th CT- "Progression"; Continue Opdivo;APRIL-MAY 2018- STABLE LUNG NODULES [stopped sec to severe cough; NO Pneumonitis]  # May 31st 2018- [place] 60mg /day; July 31st CT lung- improved lung nodules; Sep 1st week- cabo- 40mg /day [dose reduced sec to mult intol]; Oct 1st 2week-[place] 20mg /day;   # NOV 15th 2018- CT chest- slight progression of lung nodules [poor tol to higer doses];   # Nov 28th 2018- SUTENT 50 mg 2w-on  &1 w-OFF;  # MARCH 2019- Progression; start Axitinib 5 mg BID  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  Patient states that blood pressures are better controlled; since he has been taking 1 pill of the axitinib; he also stopped taking his Midodrine.  Has chronic mild shortness of breath chronic cough not any worse.  He has ran out of his COPD  inhalers.  REVIEW OF SYSTEMS:  A complete 10 point review of system is done which is negative except mentioned above/history of present illness.   PAST MEDICAL HISTORY :  Past Medical History:  Diagnosis Date  . CAD (coronary artery disease)   . Cancer ([place])    left arm  . COPD (chronic obstructive pulmonary disease) ([place])   . Hypertension   . Kidney stone   . Lung cancer ([place])   . Melanoma ([place]) 01/24/2016   right shoulder  . Thyroid nodule 02/02/2016   left BENIGN THYROID NODULE by FNA    PAST SURGICAL HISTORY :   Past Surgical History:  Procedure Laterality Date  . arm surgery Left    arm  . cardiac stents  2011   Angioplasty / Stenting Femoral-X2  . COLONOSCOPY  04/01/12  . CORONARY ANGIOPLASTY    . EXCISION MELANOMA WITH SENTINEL LYMPH NODE BIOPSY Right 01/24/2016   Procedure: EXCISION MELANOMA Right Shoulder;  Surgeon: Robert Bellow, MD;  Location: [facility] ORS;  Service: General;  Laterality: Right;  . LAPAROSCOPIC NEPHRECTOMY, HAND ASSISTED Left 04/24/2016   Procedure: HAND ASSISTED LAPAROSCOPIC NEPHRECTOMY;  Surgeon: Hollice Espy, MD;  Location: [facility] ORS;  Service: Urology;  Laterality: Left;  Marland Kitchen VIDEO ASSISTED THORACOSCOPY (VATS)/THOROCOTOMY Left 03/08/2016   Procedure: VIDEO ASSISTED THORACOSCOPY (VATS) with lung biopsy - Left ;  Surgeon: Robert Bellow, MD;  Location: [facility] ORS;  Service: General;  Laterality: Left;    FAMILY HISTORY :   Family History  Problem Relation Age of Onset  . Hodgkin's lymphoma  Daughter 5  . Kidney cancer Neg Hx   . Kidney disease Neg Hx   . Prostate cancer Neg Hx     SOCIAL HISTORY:   Social History   Tobacco Use  . Smoking status: Former Smoker    Types: Cigarettes    Start date: 01/18/1956    Last attempt to quit: 01/17/1969    Years since quitting: 49.1  . Smokeless tobacco: Never Used  Substance Use Topics  . Alcohol use: No    Alcohol/week: 0.0 oz    Comment: BEER OCC  . Drug use: No    ALLERGIES:  is allergic to  lisinopril and other.  MEDICATIONS:  Current Outpatient Medications  Medication Sig Dispense Refill  . acetaminophen (TYLENOL) 500 MG tablet Take 500 mg by mouth every 6 (six) hours as needed for moderate pain.    Marland Kitchen albuterol (PROVENTIL HFA;VENTOLIN HFA) 108 (90 Base) MCG/ACT inhaler Inhale 2 puffs into the lungs every 6 (six) hours as needed for wheezing or shortness of breath. 1 Inhaler 2  . amLODipine (NORVASC) 5 MG tablet Take 1 tablet (5 mg total) by mouth 2 (two) times daily. 60 tablet 0  . aspirin EC 81 MG tablet Take 81 mg by mouth daily.    Marland Kitchen atorvastatin (LIPITOR) 40 MG tablet Take 40 mg by mouth daily.    Marland Kitchen axitinib (INLYTA) 5 MG tablet Take 1 tablet (5 mg total) by mouth 2 (two) times daily. (Patient taking differently: Take 5 mg by mouth daily. ) 60 tablet 4  . busPIRone (BUSPAR) 5 MG tablet Take 2 tablets by mouth daily.    . clopidogrel (PLAVIX) 75 MG tablet Take 75 mg by mouth daily.    . DULoxetine (CYMBALTA) 30 MG capsule Take 2 capsules by mouth daily.    . ferrous sulfate 325 (65 FE) MG EC tablet Take 325 mg by mouth 3 (three) times daily with meals.    . fluticasone (FLONASE) 50 MCG/ACT nasal spray Place 2 sprays into both nostrils daily. 16 g 2  . Fluticasone-Salmeterol (ADVAIR DISKUS) 500-50 MCG/DOSE AEPB Inhale 1 puff into the lungs 2 (two) times daily. 60 each 11  . ipratropium-albuterol (DUONEB) 0.5-2.5 (3) MG/3ML SOLN Take 3 mLs by nebulization every 4 (four) hours as needed. 360 mL 3  . losartan (COZAAR) 50 MG tablet Take 50 mg by mouth every morning.     Marland Kitchen MELATONIN PO Take 10 mg by mouth at bedtime.     . metoprolol succinate (TOPROL-XL) 50 MG 24 hr tablet Take 50 mg by mouth every morning.     . Misc Natural Products (GLUCOSAMINE CHOND COMPLEX/MSM) TABS Take 2 tablets by mouth daily.    . Omega-3 Fatty Acids (FISH OIL) 1000 MG CPDR Take 1 capsule by mouth daily.    . OXYGEN Inhale 3 L into the lungs continuous. bedtime    . tamsulosin (FLOMAX) 0.4 MG CAPS  capsule Take 1 capsule (0.4 mg total) by mouth every morning. 30 capsule 1  . Tiotropium Bromide Monohydrate (SPIRIVA RESPIMAT) 1.25 MCG/ACT AERS Inhale 1.25 Act into the lungs 2 (two) times daily. 1 Inhaler 11  . traMADol (ULTRAM) 50 MG tablet Take 1 tablet (50 mg total) by mouth every 6 (six) hours as needed for moderate pain or severe pain. 12 tablet 0  . triamcinolone ointment (KENALOG) 0.5 % Apply 1 application topically 2 (two) times daily. 30 g 2  . docusate sodium (COLACE) 100 MG capsule Take 1 capsule (100 mg total) by mouth 2 (  two) times daily. (Patient not taking: Reported on 02/17/2018) 60 capsule 3  . magic mouthwash w/lidocaine SOLN Take 5 mLs by mouth 4 (four) times daily. (Patient not taking: Reported on 02/07/2018) 480 mL 3  . midodrine (PROAMATINE) 5 MG tablet Take 1 tablet (5 mg total) by mouth 3 (three) times daily with meals. (Patient not taking: Reported on 02/17/2018) 90 tablet 1  . nitroGLYCERIN (NITROSTAT) 0.4 MG SL tablet Place 0.4 mg under the tongue every 5 (five) minutes as needed.      No current facility-administered medications for this visit.     PHYSICAL EXAMINATION: ECOG PERFORMANCE STATUS: 0 - Asymptomatic  BP 139/89   Pulse (!) 58   Temp 97.9 F (36.6 C) (Tympanic)   Resp 18   Ht 6\' 1"  (1.854 m)   Wt 208 lb (94.3 kg)   BMI 27.44 kg/m   Filed Weights   02/17/18 1428  Weight: 208 lb (94.3 kg)    GENERAL: Well-nourished well-developed; Alert, no distress and comfortable.  He is accompanied by his wife/son and daughter EYES: no pallor or icterus OROPHARYNX: no thrush or ulceration; NECK: supple, no masses felt LYMPH:  no palpable lymphadenopathy in the cervical, axillary or inguinal regions LUNGS: Decreased breath sounds to auscultation at the bases.  No wheeze or crackles HEART/CVS: regular rate & rhythm and no murmurs; No lower extremity edema ABDOMEN:abdomen soft, non-tender and normal bowel sounds Musculoskeletal:no cyanosis of digits and no  clubbing  PSYCH: alert & oriented x 3 with fluent speech NEURO: no focal motor/sensory deficits SKIN:  Skin rash resolved.  LABORATORY DATA:  I have reviewed the data as listed    Component Value Date/Time   NA 134 (L) 02/17/2018 1350   NA 146 (H) 05/25/2016 1514   K 4.3 02/17/2018 1350   CL 99 (L) 02/17/2018 1350   CO2 26 02/17/2018 1350   GLUCOSE 118 (H) 02/17/2018 1350   BUN 17 02/17/2018 1350   BUN 19 05/25/2016 1514   CREATININE 1.21 02/17/2018 1350   CALCIUM 9.8 02/17/2018 1350   PROT 7.6 02/17/2018 1350   ALBUMIN 4.3 02/17/2018 1350   AST 20 02/17/2018 1350   ALT 16 (L) 02/17/2018 1350   ALKPHOS 104 02/17/2018 1350   BILITOT 1.0 02/17/2018 1350   GFRNONAA 56 (L) 02/17/2018 1350   GFRAA >60 02/17/2018 1350    No results found for: SPEP, UPEP  Lab Results  Component Value Date   WBC 6.7 02/17/2018   NEUTROABS 4.2 02/17/2018   HGB 16.4 02/17/2018   HCT 45.3 02/17/2018   MCV 99.8 02/17/2018   PLT 158 02/17/2018      Chemistry      Component Value Date/Time   NA 134 (L) 02/17/2018 1350   NA 146 (H) 05/25/2016 1514   K 4.3 02/17/2018 1350   CL 99 (L) 02/17/2018 1350   CO2 26 02/17/2018 1350   BUN 17 02/17/2018 1350   BUN 19 05/25/2016 1514   CREATININE 1.21 02/17/2018 1350      Component Value Date/Time   CALCIUM 9.8 02/17/2018 1350   ALKPHOS 104 02/17/2018 1350   AST 20 02/17/2018 1350   ALT 16 (L) 02/17/2018 1350   BILITOT 1.0 02/17/2018 1350     RADIOGRAPHIC STUDIES: I have personally reviewed the radiological images as listed and agreed with the findings in the report. No results found.  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   #Patient has had multiple intolerance to previous TKIs/and also to Axitinib 5 mg BID. Currently tolerating 5 mg one a day better. Continue current  dose; will ramp it up at next visit in 3 weeks/ if tolerating well.   #Chronic cough-improved/stable- refills given on spiriva/ advair/ albuterol. Continue claritin.   #Poorly controlled blood pressure/labile blood pressures-currently improved; discontinue Midorine.   #Follow-up in approximately 3 weeks; labs.    Orders Placed This Encounter  Procedures  . CBC with Differential/Platelet    Standing Status:   Future    Standing Expiration Date:   02/18/2019  . Comprehensive metabolic panel    Standing Status:   Future    Standing Expiration Date:   02/18/2019      Cammie Sickle, MD 02/18/2018 4:41 PM

## 2018-02-17 NOTE — Assessment & Plan Note (Addendum)
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   #Patient has had multiple intolerance to previous TKIs/and also to Axitinib 5 mg BID. Currently tolerating 5 mg one a day better. Continue current dose; will ramp it up at next visit in 3 weeks/ if tolerating well.   #Chronic cough-improved/stable- refills given on spiriva/ advair/ albuterol. Continue claritin.   #Poorly controlled blood pressure/labile blood pressures-currently improved; discontinue Midorine.   #Follow-up in approximately 3 weeks; labs.

## 2018-02-27 DIAGNOSIS — M542 Cervicalgia: Secondary | ICD-10-CM | POA: Diagnosis not present

## 2018-03-13 ENCOUNTER — Other Ambulatory Visit: Payer: Self-pay

## 2018-03-13 ENCOUNTER — Inpatient Hospital Stay (HOSPITAL_BASED_OUTPATIENT_CLINIC_OR_DEPARTMENT_OTHER): Payer: PPO | Admitting: Internal Medicine

## 2018-03-13 ENCOUNTER — Other Ambulatory Visit: Payer: Self-pay | Admitting: *Deleted

## 2018-03-13 ENCOUNTER — Encounter: Payer: Self-pay | Admitting: Internal Medicine

## 2018-03-13 ENCOUNTER — Inpatient Hospital Stay: Payer: PPO

## 2018-03-13 VITALS — BP 135/86 | HR 65 | Temp 97.9°F | Resp 20 | Ht 73.0 in | Wt 209.0 lb

## 2018-03-13 DIAGNOSIS — Z85820 Personal history of malignant melanoma of skin: Secondary | ICD-10-CM | POA: Diagnosis not present

## 2018-03-13 DIAGNOSIS — Z905 Acquired absence of kidney: Secondary | ICD-10-CM | POA: Diagnosis not present

## 2018-03-13 DIAGNOSIS — I10 Essential (primary) hypertension: Secondary | ICD-10-CM | POA: Diagnosis not present

## 2018-03-13 DIAGNOSIS — J449 Chronic obstructive pulmonary disease, unspecified: Secondary | ICD-10-CM

## 2018-03-13 DIAGNOSIS — E079 Disorder of thyroid, unspecified: Secondary | ICD-10-CM

## 2018-03-13 DIAGNOSIS — M62838 Other muscle spasm: Secondary | ICD-10-CM

## 2018-03-13 DIAGNOSIS — C642 Malignant neoplasm of left kidney, except renal pelvis: Secondary | ICD-10-CM

## 2018-03-13 DIAGNOSIS — R05 Cough: Secondary | ICD-10-CM

## 2018-03-13 DIAGNOSIS — Z87891 Personal history of nicotine dependence: Secondary | ICD-10-CM

## 2018-03-13 DIAGNOSIS — I2510 Atherosclerotic heart disease of native coronary artery without angina pectoris: Secondary | ICD-10-CM | POA: Diagnosis not present

## 2018-03-13 DIAGNOSIS — Z87442 Personal history of urinary calculi: Secondary | ICD-10-CM

## 2018-03-13 DIAGNOSIS — C7800 Secondary malignant neoplasm of unspecified lung: Secondary | ICD-10-CM | POA: Diagnosis not present

## 2018-03-13 DIAGNOSIS — M542 Cervicalgia: Secondary | ICD-10-CM

## 2018-03-13 DIAGNOSIS — Z9221 Personal history of antineoplastic chemotherapy: Secondary | ICD-10-CM

## 2018-03-13 LAB — CBC WITH DIFFERENTIAL/PLATELET
Basophils Absolute: 0 10*3/uL (ref 0–0.1)
Basophils Relative: 0 %
Eosinophils Absolute: 0.1 10*3/uL (ref 0–0.7)
Eosinophils Relative: 1 %
HEMATOCRIT: 44 % (ref 40.0–52.0)
Hemoglobin: 15.8 g/dL (ref 13.0–18.0)
LYMPHS ABS: 1.7 10*3/uL (ref 1.0–3.6)
Lymphocytes Relative: 17 %
MCH: 34.9 pg — AB (ref 26.0–34.0)
MCHC: 35.9 g/dL (ref 32.0–36.0)
MCV: 97.4 fL (ref 80.0–100.0)
MONOS PCT: 8 %
Monocytes Absolute: 0.7 10*3/uL (ref 0.2–1.0)
NEUTROS ABS: 6.9 10*3/uL — AB (ref 1.4–6.5)
Neutrophils Relative %: 74 %
Platelets: 168 10*3/uL (ref 150–440)
RBC: 4.52 MIL/uL (ref 4.40–5.90)
RDW: 13.1 % (ref 11.5–14.5)
WBC: 9.5 10*3/uL (ref 3.8–10.6)

## 2018-03-13 LAB — COMPREHENSIVE METABOLIC PANEL
ALBUMIN: 4 g/dL (ref 3.5–5.0)
ALT: 15 U/L — AB (ref 17–63)
ANION GAP: 11 (ref 5–15)
AST: 24 U/L (ref 15–41)
Alkaline Phosphatase: 89 U/L (ref 38–126)
BUN: 20 mg/dL (ref 6–20)
CHLORIDE: 98 mmol/L — AB (ref 101–111)
CO2: 25 mmol/L (ref 22–32)
CREATININE: 1.26 mg/dL — AB (ref 0.61–1.24)
Calcium: 9.7 mg/dL (ref 8.9–10.3)
GFR calc non Af Amer: 54 mL/min — ABNORMAL LOW (ref >60)
GLUCOSE: 162 mg/dL — AB (ref 65–99)
Potassium: 4.4 mmol/L (ref 3.5–5.1)
SODIUM: 134 mmol/L — AB (ref 135–145)
Total Bilirubin: 0.9 mg/dL (ref 0.3–1.2)
Total Protein: 7.3 g/dL (ref 6.5–8.1)

## 2018-03-13 MED ORDER — MONTELUKAST SODIUM 10 MG PO TABS: 10.0000 mg | ORAL_TABLET | Freq: Every day | ORAL | 3 refills | Status: DC

## 2018-03-13 MED ORDER — TIZANIDINE HCL 4 MG PO TABS: 4.0000 mg | ORAL_TABLET | ORAL | 2 refills | Status: DC | PRN

## 2018-03-13 NOTE — Progress Notes (Signed)
[place] OFFICE PROGRESS NOTE  Patient Care Team: Dion Body, MD as PCP - General (Family Medicine) Jannet Mantis, MD (Dermatology) Bary Castilla Forest Gleason, MD (General Surgery) Hollice Espy, MD as Consulting Physician (Urology)  No matching staging information was found for the patient.   Oncology History   # MAY- June 2017- METASTATIC CLEAR CELL; LEFT RENAL CA/STAGE IV; Furhman- G-3; INTERMEDIATE RISK;  [bil Pul lung nodules;incidental s/p Bx Dr.Byrnett/Dr.Oaks] s/p Cytoreductive nephrectomy; Dr.Brandon- [id number]; July 11th 2017 CT- Enlarging Lung nodules  # Aug 7th 2017- PAZOPANIB; STOP SEP 2017 [pancreatitis/poor tol]  # OCT 1 week- START NIVO q 2W x4; DEC 8th CT- "Progression"; Continue Opdivo;APRIL-MAY 2018- STABLE LUNG NODULES [stopped sec to severe cough; NO Pneumonitis]  # May 31st 2018- [place] 60mg /day; July 31st CT lung- improved lung nodules; Sep 1st week- cabo- 40mg /day [dose reduced sec to mult intol]; Oct 1st 2week-[place] 20mg /day;   # NOV 15th 2018- CT chest- slight progression of lung nodules [poor tol to higer doses];   # Nov 28th 2018- [place] 50 mg 2w-on  &1 w-OFF;  # MARCH 2019- Progression; start Axitinib 5 mg BID  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  He continues to have chronic shortness of breath chronic cough.  Not any worse.  Complains of neck pain; otherwise denies any bone pain.   REVIEW OF SYSTEMS:  A complete 10 point review of system is done which is negative except mentioned  above/history of present illness.   PAST MEDICAL HISTORY :  Past Medical History:  Diagnosis Date  . CAD (coronary artery disease)   . Cancer ([place])    left arm  . COPD (chronic obstructive pulmonary disease) ([place])   . Hypertension   . Kidney stone   . Lung cancer ([place])   . Melanoma ([place]) 01/24/2016   right shoulder  . Thyroid nodule 02/02/2016   left BENIGN THYROID NODULE by FNA    PAST SURGICAL HISTORY :   Past Surgical History:  Procedure Laterality Date  . arm surgery Left    arm  . cardiac stents  2011   Angioplasty / Stenting Femoral-X2  . COLONOSCOPY  04/01/12  . CORONARY ANGIOPLASTY    . EXCISION MELANOMA WITH SENTINEL LYMPH NODE BIOPSY Right 01/24/2016   Procedure: EXCISION MELANOMA Right Shoulder;  Surgeon: Robert Bellow, MD;  Location: [facility] ORS;  Service: General;  Laterality: Right;  . LAPAROSCOPIC NEPHRECTOMY, HAND ASSISTED Left 04/24/2016   Procedure: HAND ASSISTED LAPAROSCOPIC NEPHRECTOMY;  Surgeon: Hollice Espy, MD;  Location: [facility] ORS;  Service: Urology;  Laterality: Left;  Marland Kitchen VIDEO ASSISTED THORACOSCOPY (VATS)/THOROCOTOMY Left 03/08/2016   Procedure: VIDEO ASSISTED THORACOSCOPY (VATS) with lung biopsy - Left ;  Surgeon: Robert Bellow, MD;  Location: [facility] ORS;  Service: General;  Laterality: Left;    FAMILY HISTORY :   Family History  Problem Relation Age of Onset  . Hodgkin's lymphoma Daughter 5  . Kidney cancer Neg Hx   . Kidney disease Neg Hx   . Prostate cancer  Neg Hx     SOCIAL HISTORY:   Social History   Tobacco Use  . Smoking status: Former Smoker    Types: Cigarettes    Start date: 01/18/1956    Last attempt to quit: 01/17/1969    Years since quitting: 49.2  . Smokeless tobacco: Never Used  Substance Use Topics  . Alcohol use: No    Alcohol/week: 0.0 oz    Comment: BEER OCC  . Drug use: No    ALLERGIES:  is allergic to lisinopril and other.  MEDICATIONS:  Current Outpatient Medications  Medication Sig Dispense Refill  .  acetaminophen (TYLENOL) 500 MG tablet Take 500 mg by mouth every 6 (six) hours as needed for moderate pain.    Marland Kitchen albuterol (PROVENTIL HFA;VENTOLIN HFA) 108 (90 Base) MCG/ACT inhaler Inhale 2 puffs into the lungs every 6 (six) hours as needed for wheezing or shortness of breath. 1 Inhaler 2  . amLODipine (NORVASC) 5 MG tablet Take 1 tablet (5 mg total) by mouth 2 (two) times daily. 60 tablet 0  . aspirin EC 81 MG tablet Take 81 mg by mouth daily.    Marland Kitchen atorvastatin (LIPITOR) 40 MG tablet Take 40 mg by mouth daily.    Marland Kitchen axitinib (INLYTA) 5 MG tablet Take 1 tablet (5 mg total) by mouth 2 (two) times daily. (Patient taking differently: Take 5 mg by mouth daily. ) 60 tablet 4  . busPIRone (BUSPAR) 5 MG tablet Take 2 tablets by mouth daily.    . clopidogrel (PLAVIX) 75 MG tablet Take 75 mg by mouth daily.    Marland Kitchen docusate sodium (COLACE) 100 MG capsule Take 1 capsule (100 mg total) by mouth 2 (two) times daily. 60 capsule 3  . DULoxetine (CYMBALTA) 30 MG capsule Take 2 capsules by mouth daily.    . ferrous sulfate 325 (65 FE) MG EC tablet Take 325 mg by mouth 3 (three) times daily with meals.    . fluticasone (FLONASE) 50 MCG/ACT nasal spray Place 2 sprays into both nostrils daily. 16 g 2  . Fluticasone-Salmeterol (ADVAIR DISKUS) 500-50 MCG/DOSE AEPB Inhale 1 puff into the lungs 2 (two) times daily. 60 each 11  . ipratropium-albuterol (DUONEB) 0.5-2.5 (3) MG/3ML SOLN Take 3 mLs by nebulization every 4 (four) hours as needed. 360 mL 3  . losartan (COZAAR) 50 MG tablet Take 50 mg by mouth every morning.     Marland Kitchen MELATONIN PO Take 10 mg by mouth at bedtime.     . metoprolol succinate (TOPROL-XL) 50 MG 24 hr tablet Take 50 mg by mouth every morning.     . Misc Natural Products (GLUCOSAMINE CHOND COMPLEX/MSM) TABS Take 2 tablets by mouth daily.    . Omega-3 Fatty Acids (FISH OIL) 1000 MG CPDR Take 1 capsule by mouth daily.    . Tiotropium Bromide Monohydrate (SPIRIVA RESPIMAT) 1.25 MCG/ACT AERS Inhale 1.25 Act into  the lungs 2 (two) times daily. 1 Inhaler 11  . tiZANidine (ZANAFLEX) 4 MG tablet Take 1 tablet (4 mg total) by mouth as needed for muscle spasms. 30 tablet 2  . loratadine (CLARITIN) 10 MG tablet Take 10 mg by mouth daily.    . midodrine (PROAMATINE) 2.5 MG tablet Take 1 tablet (2.5 mg total) by mouth 2 (two) times daily with a meal. 60 tablet 0  . montelukast (SINGULAIR) 10 MG tablet Take 1 tablet (10 mg total) by mouth at bedtime. 30 tablet 3  . nitroGLYCERIN (NITROSTAT) 0.4 MG SL tablet Place 0.4 mg under the  tongue every 5 (five) minutes as needed.     . tamsulosin (FLOMAX) 0.4 MG CAPS capsule TAKE 1 CAPSULE (0.4 MG TOTAL) BY MOUTH EVERY MORNING. 30 capsule 1   No current facility-administered medications for this visit.     PHYSICAL EXAMINATION: ECOG PERFORMANCE STATUS: 0 - Asymptomatic  BP 135/86   Pulse 65   Temp 97.9 F (36.6 C) (Tympanic)   Resp 20   Ht 6\' 1"  (1.854 m)   Wt 209 lb (94.8 kg)   BMI 27.57 kg/m   Filed Weights   03/13/18 1113  Weight: 209 lb (94.8 kg)    GENERAL: Well-nourished well-developed; Alert, no distress and comfortable.  He is accompanied by his wife/son and daughter EYES: no pallor or icterus OROPHARYNX: no thrush or ulceration; NECK: supple, no masses felt LYMPH:  no palpable lymphadenopathy in the cervical, axillary or inguinal regions LUNGS: Decreased breath sounds to auscultation at the bases.  No wheeze or crackles HEART/CVS: regular rate & rhythm and no murmurs; No lower extremity edema ABDOMEN:abdomen soft, non-tender and normal bowel sounds Musculoskeletal:no cyanosis of digits and no clubbing  PSYCH: alert & oriented x 3 with fluent speech NEURO: no focal motor/sensory deficits SKIN:  Skin rash resolved.  LABORATORY DATA:  I have reviewed the data as listed    Component Value Date/Time   NA 133 (L) 03/24/2018 1001   NA 146 (H) 05/25/2016 1514   K 3.9 03/24/2018 1001   CL 98 (L) 03/24/2018 1001   CO2 25 03/24/2018 1001    GLUCOSE 188 (H) 03/24/2018 1001   BUN 14 03/24/2018 1001   BUN 19 05/25/2016 1514   CREATININE 1.41 (H) 03/24/2018 1001   CALCIUM 8.9 03/24/2018 1001   PROT 6.8 03/24/2018 1001   ALBUMIN 3.6 03/24/2018 1001   AST 23 03/24/2018 1001   ALT 19 03/24/2018 1001   ALKPHOS 104 03/24/2018 1001   BILITOT 0.8 03/24/2018 1001   GFRNONAA 47 (L) 03/24/2018 1001   GFRAA 54 (L) 03/24/2018 1001    No results found for: SPEP, UPEP  Lab Results  Component Value Date   WBC 7.1 03/24/2018   NEUTROABS 4.5 03/24/2018   HGB 15.4 03/24/2018   HCT 41.6 03/24/2018   MCV 93.4 03/24/2018   PLT 174 03/24/2018      Chemistry      Component Value Date/Time   NA 133 (L) 03/24/2018 1001   NA 146 (H) 05/25/2016 1514   K 3.9 03/24/2018 1001   CL 98 (L) 03/24/2018 1001   CO2 25 03/24/2018 1001   BUN 14 03/24/2018 1001   BUN 19 05/25/2016 1514   CREATININE 1.41 (H) 03/24/2018 1001      Component Value Date/Time   CALCIUM 8.9 03/24/2018 1001   ALKPHOS 104 03/24/2018 1001   AST 23 03/24/2018 1001   ALT 19 03/24/2018 1001   BILITOT 0.8 03/24/2018 1001     RADIOGRAPHIC STUDIES: I have personally reviewed the radiological images as listed and agreed with the findings in the report. No results found.  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   #Patient has had multiple intolerance to previous TKIs/and also to Axitinib 5 mg BID. Currently tolerating 5 mg one a day better. Start 5 mg BID starting today; will call for refills   #Chronic cough-improved/stable- refills given on spiriva/ advair/ albuterol. Continue  claritin.   #Neck pain likely arthritis; refill muscle relaxant.  #Poorly controlled blood pressure/labile blood pressures-currently improved; discontinue Midorine.   #Follow-up in approximately 3 weeks; labs.    Orders  Placed This Encounter  Procedures  . Comprehensive metabolic panel    Standing Status:   Future    Standing Expiration Date:   03/14/2019  . CBC with Differential    Standing Status:   Future    Standing Expiration Date:   03/14/2019      Cammie Sickle, MD 03/30/2018 8:21 PM

## 2018-03-13 NOTE — Progress Notes (Signed)
Patient here for follow-up for renal cancer. Patient states he was tx for a strained muscle in his neck a few weeks ago. He has finished his muscle relaxers, but does not have any more. He would like to know if Dr. B would be willing to RF.

## 2018-03-13 NOTE — Assessment & Plan Note (Addendum)
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   #Patient has had multiple intolerance to previous TKIs/and also to Axitinib 5 mg BID. Currently tolerating 5 mg one a day better. Start 5 mg BID starting today; will call for refills   #Chronic cough-improved/stable- refills given on spiriva/ advair/ albuterol. Continue claritin.   #Neck pain likely arthritis; refill muscle relaxant.  #Poorly controlled blood pressure/labile blood pressures-currently improved; discontinue Midorine.   #Follow-up in approximately 3 weeks; labs.

## 2018-03-14 MED FILL — INLYTA 5 MG TABLET: 5 | 30 days supply | Qty: 60 | Fill #1

## 2018-03-15 ENCOUNTER — Other Ambulatory Visit: Payer: Self-pay | Admitting: Internal Medicine

## 2018-03-19 DIAGNOSIS — C642 Malignant neoplasm of left kidney, except renal pelvis: Secondary | ICD-10-CM | POA: Diagnosis not present

## 2018-03-19 DIAGNOSIS — R05 Cough: Secondary | ICD-10-CM | POA: Diagnosis not present

## 2018-03-19 DIAGNOSIS — J449 Chronic obstructive pulmonary disease, unspecified: Secondary | ICD-10-CM | POA: Diagnosis not present

## 2018-03-19 DIAGNOSIS — R0602 Shortness of breath: Secondary | ICD-10-CM | POA: Diagnosis not present

## 2018-03-21 ENCOUNTER — Telehealth: Payer: Self-pay | Admitting: Pharmacist

## 2018-03-21 ENCOUNTER — Other Ambulatory Visit: Payer: Self-pay | Admitting: *Deleted

## 2018-03-21 DIAGNOSIS — R059 Cough, unspecified: Secondary | ICD-10-CM

## 2018-03-21 DIAGNOSIS — C642 Malignant neoplasm of left kidney, except renal pelvis: Secondary | ICD-10-CM

## 2018-03-21 DIAGNOSIS — R05 Cough: Secondary | ICD-10-CM

## 2018-03-21 DIAGNOSIS — R0602 Shortness of breath: Secondary | ICD-10-CM

## 2018-03-21 NOTE — Telephone Encounter (Signed)
RN Spoke with patient's wife. Pt c/o hypotension, shortness of breath and cough. Currently out of town at [employer] for a wedding. Wife requesting apt with symptom mgmt NP on Monday for f/u. BP on Sunday was 90/50. Pt had dizziness at that time. Per wife, pt Increased his PO fluid intake and drank Gatorade. Systolic BP improved to 381 per wife. Pt has continued to have low BP readings. She has held pt's bp medications x 1 week. Per wife, pt became weak on Sunday morning. Per wife, he had washed 2 cars outside on Saturday.  Wife requesting a cxr to be performed if possible. I spoke with Dr. Jacinto Reap. He would like an xray on Monday prior to pt's visit. Pt can see NP on Monday. Wife given an apt for 10 am with Ander Purpura, NP and will have pt obtain at cxr around [place] at the [place] location for outpt imaging.

## 2018-03-21 NOTE — Telephone Encounter (Signed)
Oral Chemotherapy Pharmacist Encounter  Follow-Up Form  Called patient today to follow up regarding patient's oral chemotherapy medication: Inlyta (axitinib)  Original Start date of oral chemotherapy: 01/2018  Pt reports the following side effects: N/A  Recent labs reviewed: CMP from 03/13/18  New medications?: None reported  Other Issues: per his wife, patient is having some hypotension and she is having to hold his BP medication. She also reported a bad cough, no fever assoicated. They are out of town. Spoke with Dr. Rogue Bussing and his team and they will schedule him to be seen on Monday 03/24/18.   Patient knows to call the office with questions or concerns. [facility]cology [facility] will continue to follow.  Darl Pikes, PharmD, BCPS Hematology/Oncology Clinical Pharmacist [facility] [phone]  03/21/2018 4:36 PM

## 2018-03-24 ENCOUNTER — Ambulatory Visit
Admission: RE | Admit: 2018-03-24 | Discharge: 2018-03-24 | Disposition: A | Discharge status: Home / Self Care | Payer: PPO | Source: Ambulatory Visit | Attending: Internal Medicine | Admitting: Internal Medicine

## 2018-03-24 ENCOUNTER — Inpatient Hospital Stay: Payer: PPO

## 2018-03-24 ENCOUNTER — Inpatient Hospital Stay: Payer: PPO | Attending: Nurse Practitioner | Admitting: Nurse Practitioner

## 2018-03-24 ENCOUNTER — Encounter: Payer: Self-pay | Admitting: Nurse Practitioner

## 2018-03-24 ENCOUNTER — Other Ambulatory Visit: Payer: Self-pay | Admitting: Nurse Practitioner

## 2018-03-24 ENCOUNTER — Other Ambulatory Visit: Payer: Self-pay | Admitting: *Deleted

## 2018-03-24 ENCOUNTER — Telehealth: Payer: Self-pay | Admitting: *Deleted

## 2018-03-24 VITALS — BP 134/86 | HR 68

## 2018-03-24 VITALS — BP 103/65 | HR 63 | Temp 96.7°F | Resp 20 | Wt 203.8 lb

## 2018-03-24 DIAGNOSIS — Z806 Family history of leukemia: Secondary | ICD-10-CM

## 2018-03-24 DIAGNOSIS — Z87891 Personal history of nicotine dependence: Secondary | ICD-10-CM | POA: Insufficient documentation

## 2018-03-24 DIAGNOSIS — Z87442 Personal history of urinary calculi: Secondary | ICD-10-CM | POA: Insufficient documentation

## 2018-03-24 DIAGNOSIS — R0602 Shortness of breath: Secondary | ICD-10-CM | POA: Insufficient documentation

## 2018-03-24 DIAGNOSIS — I959 Hypotension, unspecified: Secondary | ICD-10-CM | POA: Diagnosis not present

## 2018-03-24 DIAGNOSIS — E041 Nontoxic single thyroid nodule: Secondary | ICD-10-CM | POA: Insufficient documentation

## 2018-03-24 DIAGNOSIS — I2510 Atherosclerotic heart disease of native coronary artery without angina pectoris: Secondary | ICD-10-CM | POA: Diagnosis not present

## 2018-03-24 DIAGNOSIS — Z85820 Personal history of malignant melanoma of skin: Secondary | ICD-10-CM | POA: Diagnosis not present

## 2018-03-24 DIAGNOSIS — J069 Acute upper respiratory infection, unspecified: Secondary | ICD-10-CM | POA: Diagnosis not present

## 2018-03-24 DIAGNOSIS — C642 Malignant neoplasm of left kidney, except renal pelvis: Secondary | ICD-10-CM | POA: Insufficient documentation

## 2018-03-24 DIAGNOSIS — R911 Solitary pulmonary nodule: Secondary | ICD-10-CM | POA: Diagnosis not present

## 2018-03-24 DIAGNOSIS — R21 Rash and other nonspecific skin eruption: Secondary | ICD-10-CM

## 2018-03-24 DIAGNOSIS — R05 Cough: Secondary | ICD-10-CM | POA: Diagnosis not present

## 2018-03-24 DIAGNOSIS — J42 Unspecified chronic bronchitis: Secondary | ICD-10-CM | POA: Diagnosis not present

## 2018-03-24 DIAGNOSIS — C7800 Secondary malignant neoplasm of unspecified lung: Secondary | ICD-10-CM | POA: Insufficient documentation

## 2018-03-24 DIAGNOSIS — R918 Other nonspecific abnormal finding of lung field: Secondary | ICD-10-CM

## 2018-03-24 DIAGNOSIS — R739 Hyperglycemia, unspecified: Secondary | ICD-10-CM | POA: Diagnosis not present

## 2018-03-24 DIAGNOSIS — E860 Dehydration: Secondary | ICD-10-CM | POA: Insufficient documentation

## 2018-03-24 DIAGNOSIS — I10 Essential (primary) hypertension: Secondary | ICD-10-CM | POA: Diagnosis not present

## 2018-03-24 DIAGNOSIS — C801 Malignant (primary) neoplasm, unspecified: Secondary | ICD-10-CM

## 2018-03-24 DIAGNOSIS — I951 Orthostatic hypotension: Secondary | ICD-10-CM | POA: Insufficient documentation

## 2018-03-24 DIAGNOSIS — J9811 Atelectasis: Secondary | ICD-10-CM | POA: Insufficient documentation

## 2018-03-24 DIAGNOSIS — R638 Other symptoms and signs concerning food and fluid intake: Secondary | ICD-10-CM

## 2018-03-24 DIAGNOSIS — R059 Cough, unspecified: Secondary | ICD-10-CM

## 2018-03-24 DIAGNOSIS — J449 Chronic obstructive pulmonary disease, unspecified: Secondary | ICD-10-CM | POA: Diagnosis not present

## 2018-03-24 DIAGNOSIS — Z79899 Other long term (current) drug therapy: Secondary | ICD-10-CM | POA: Diagnosis not present

## 2018-03-24 DIAGNOSIS — M542 Cervicalgia: Secondary | ICD-10-CM | POA: Diagnosis not present

## 2018-03-24 LAB — COMPREHENSIVE METABOLIC PANEL
ALBUMIN: 3.6 g/dL (ref 3.5–5.0)
ALT: 19 U/L (ref 17–63)
ANION GAP: 10 (ref 5–15)
AST: 23 U/L (ref 15–41)
Alkaline Phosphatase: 104 U/L (ref 38–126)
BILIRUBIN TOTAL: 0.8 mg/dL (ref 0.3–1.2)
BUN: 14 mg/dL (ref 6–20)
CHLORIDE: 98 mmol/L — AB (ref 101–111)
CO2: 25 mmol/L (ref 22–32)
Calcium: 8.9 mg/dL (ref 8.9–10.3)
Creatinine, Ser: 1.41 mg/dL — ABNORMAL HIGH (ref 0.61–1.24)
GFR calc Af Amer: 54 mL/min — ABNORMAL LOW (ref >60)
GFR calc non Af Amer: 47 mL/min — ABNORMAL LOW (ref >60)
Glucose, Bld: 188 mg/dL — ABNORMAL HIGH (ref 65–99)
POTASSIUM: 3.9 mmol/L (ref 3.5–5.1)
Sodium: 133 mmol/L — ABNORMAL LOW (ref 135–145)
TOTAL PROTEIN: 6.8 g/dL (ref 6.5–8.1)

## 2018-03-24 LAB — CBC WITH DIFFERENTIAL/PLATELET
BASOS ABS: 0 10*3/uL (ref 0–0.1)
Basophils Relative: 1 %
Eosinophils Absolute: 0.3 10*3/uL (ref 0–0.7)
Eosinophils Relative: 4 %
HEMATOCRIT: 41.6 % (ref 40.0–52.0)
Hemoglobin: 15.4 g/dL (ref 13.0–17.0)
LYMPHS ABS: 1.7 10*3/uL (ref 1.0–3.6)
LYMPHS PCT: 25 %
MCH: 34.5 pg — AB (ref 26.0–34.0)
MCHC: 36.9 g/dL — ABNORMAL HIGH (ref 32.0–36.0)
MCV: 93.4 fL (ref 80.0–100.0)
MONO ABS: 0.5 10*3/uL (ref 0.2–1.0)
Monocytes Relative: 7 %
NEUTROS ABS: 4.5 10*3/uL (ref 1.4–6.5)
Neutrophils Relative %: 63 %
Platelets: 174 10*3/uL (ref 150–440)
RBC: 4.46 MIL/uL (ref 4.40–5.90)
RDW: 13.6 % (ref 11.5–14.5)
WBC: 7.1 10*3/uL (ref 3.8–10.6)

## 2018-03-24 MED ORDER — MIDODRINE HCL 2.5 MG PO TABS: 2.5000 mg | ORAL_TABLET | Freq: Two times a day (BID) | ORAL | 0 refills | Status: DC

## 2018-03-24 MED ORDER — SODIUM CHLORIDE 0.9 % IV SOLN
Freq: Once | INTRAVENOUS | Status: AC
  Administered 2018-03-24: 11:00:00 via INTRAVENOUS
  Filled 2018-03-24: qty 1000

## 2018-03-24 NOTE — Telephone Encounter (Signed)
CHEST - 2 VIEW  COMPARISON:  CT chest 01/24/2018  FINDINGS: Normal heart size, mediastinal contours, and pulmonary vascularity.  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  Focus of nodularity at the LEFT apex on the prior CT exam is likely still present, suboptimally visualized due to superimposed osseous structures.  Decreased prominence of a RIGHT upper lobe nodular density since previous study now measuring approximately 11 mm diameter.  Peribronchial thickening with RIGHT basilar atelectasis.  No acute infiltrate, pleural effusion or pneumothorax.  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  IMPRESSION: Chronic bronchitic changes with RIGHT basilar atelectasis.  Decreased size of RIGHT upper lobe nodule since prior exam.  Probable persistence of LEFT upper lobe nodule.   Electronically Signed   By: Lavonia Dana M.D.   On: 03/24/2018 10:00

## 2018-03-24 NOTE — Patient Instructions (Signed)
Hypotension Hypotension, commonly called low blood pressure, is when the force of blood pumping through your arteries is too weak. Arteries are blood vessels that carry blood from the heart throughout the body. When blood pressure is too low, you may not get enough blood to your brain or to the rest of your organs. This can cause weakness, light-headedness, rapid heartbeat, and fainting. Depending on the cause and severity, hypotension may be harmless (benign) or cause serious problems (critical). What are the causes? Possible causes of hypotension include:  Blood loss.  Loss of body fluids (dehydration).  Heart problems.  Hormone (endocrine) problems.  Pregnancy.  Severe infection.  Lack of certain nutrients.  Severe allergic reactions (anaphylaxis).  Certain medicines, such as blood pressure medicine or medicines that make the body lose excess fluids (diuretics). Sometimes, hypotension can be caused by not taking medicine as directed, such as taking too much of a certain medicine.  What increases the risk? Certain factors can make you more likely to develop hypotension, including:  Age. Risk increases as you get older.  Conditions that affect the heart or the central nervous system.  Taking certain medicines, such as blood pressure medicine or diuretics.  Being pregnant.  What are the signs or symptoms? Symptoms of this condition may include:  Weakness.  Light-headedness.  Dizziness.  Blurred vision.  Fatigue.  Rapid heartbeat.  Fainting, in severe cases.  How is this diagnosed? This condition is diagnosed based on:  Your medical history.  Your symptoms.  Your blood pressure measurement. Your health care provider will check your blood pressure when you are: ? Lying down. ? Sitting. ? Standing.  A blood pressure reading is recorded as two numbers, such as "120 over 80" (or 120/80). The first ("top") number is called the systolic pressure. It is a  measure of the pressure in your arteries as your heart beats. The second ("bottom") number is called the diastolic pressure. It is a measure of the pressure in your arteries when your heart relaxes between beats. Blood pressure is measured in a unit called mm Hg. Healthy blood pressure for adults is 120/80. If your blood pressure is below 90/60, you may be diagnosed with hypotension. Other information or tests that may be used to diagnose hypotension include:  Your other vital signs, such as your heart rate and temperature.  Blood tests.  Tilt table test. For this test, you will be safely secured to a table that moves you from a lying position to an upright position. Your heart rhythm and blood pressure will be monitored during the test.  How is this treated? Treatment for this condition may include:  Changing your diet. This may involve eating more salt (sodium) or drinking more water.  Taking medicines to raise your blood pressure.  Changing the dosage of certain medicines you are taking that might be lowering your blood pressure.  Wearing compression stockings. These stockings help to prevent blood clots and reduce swelling in your legs.  In some cases, you may need to go to the hospital for:  Fluid replacement. This means you will receive fluids through an IV tube.  Blood replacement. This means you will receive donated blood through an IV tube (transfusion).  Treating an infection or heart problems, if this applies.  Monitoring. You may need to be monitored while medicines that you are taking wear off.  Follow these instructions at home: Eating and drinking   Drink enough fluid to keep your urine clear or pale yellow.  Eat a healthy diet and follow instructions from your health care provider about eating or drinking restrictions. A healthy diet includes: ? Fresh fruits and vegetables. ? Whole grains. ? Lean meats. ? Low-fat dairy products.  Eat extra salt only as  directed. Do not add extra salt to your diet unless your health care provider told you to do that.  Eat frequent, small meals.  Avoid standing up suddenly after eating. Medicines  Take over-the-counter and prescription medicines only as told by your health care provider. ? Follow instructions from your health care provider about changing the dosage of your current medicines, if this applies. ? Do not stop or adjust any of your medicines on your own. General instructions  Wear compression stockings as told by your health care provider.  Get up slowly from lying down or sitting positions. This gives your blood pressure a chance to adjust.  Avoid hot showers and excessive heat as directed by your health care provider.  Return to your normal activities as told by your health care provider. Ask your health care provider what activities are safe for you.  Do not use any products that contain nicotine or tobacco, such as cigarettes and e-cigarettes. If you need help quitting, ask your health care provider.  Keep all follow-up visits as told by your health care provider. This is important. Contact a health care provider if:  You vomit.  You have diarrhea.  You have a fever for more than 2-3 days.  You feel more thirsty than usual.  You feel weak and tired. Get help right away if:  You have chest pain.  You have a fast or irregular heartbeat.  You develop numbness in any part of your body.  You cannot move your arms or your legs.  You have trouble speaking.  You become sweaty or feel light-headed.  You faint.  You feel short of breath.  You have trouble staying awake.  You feel confused. This information is not intended to replace advice given to you by your health care provider. Make sure you discuss any questions you have with your health care provider. Document Released: 11/05/2005 Document Revised: 05/25/2016 Document Reviewed: 04/26/2016 Elsevier Interactive  Patient Education  2018 [employer]. Midodrine tablets What is this medicine? MIDODRINE (MI doe dreen) is used to treat low blood pressure in patients who have symptoms like dizziness when going from a sitting to a standing position. This medicine may be used for other purposes; ask your health care provider or pharmacist if you have questions. COMMON BRAND NAME(S): Orvaten, ProAmatine What should I tell my health care provider before I take this medicine? They need to know if you have any of the following conditions: -difficulty passing urine -heart disease -high blood pressure -kidney disease -over active thyroid -pheochromocytoma -an unusual or allergic reaction to midodrine, other medicines, foods, dyes, or preservatives -pregnant or trying to get pregnant -breast-feeding How should I use this medicine? Take this medicine by mouth with a glass of water. Follow the directions on the prescription label. The last dose of this medicine should not be taken after the evening meal or less than 4 hours before bedtime. When you lie down for any length of time after taking this medicine, high blood pressure can occur. Do not take this medicine if you will be lying down for any length of time. Do not take your medicine more often than directed. Do not stop taking except on your doctor's advice. Talk to your pediatrician regarding  the use of this medicine in children. Special care may be needed. Overdosage: If you think you have taken too much of this medicine contact a poison control center or emergency room at once. NOTE: This medicine is only for you. Do not share this medicine with others. What if I miss a dose? If you miss a dose, take it as soon as you can. If it is almost time for your next dose, take only that dose. Do not take double or extra doses. What may interact with this medicine? Do not take this medicine with any of the following medications: -MAOIs like Carbex, Eldepryl,  Marplan, Nardil, and Parnate -medicines called ergot alkaloids -medicines for colds and breathing difficulties or weight loss -procarbazine This medicine may also interact with the following medications: -cimetidine -digoxin -flecainide -fludrocortisone -metformin -procainamide -quinidine -ranitidine -triamterene -medicines called alpha-blockers like doxazosin, prazosin, and terazosin This list may not describe all possible interactions. Give your health care provider a list of all the medicines, herbs, non-prescription drugs, or dietary supplements you use. Also tell them if you smoke, drink alcohol, or use illegal drugs. Some items may interact with your medicine. What should I watch for while using this medicine? Visit your doctor or health care professional for regular checks on your progress. You may get drowsy or dizzy. Do not drive, use machinery, or do anything that needs mental alertness until you know how this medicine affects you. Do not stand or sit up quickly, especially if you are an older patient. This reduces the risk of dizzy or fainting spells. Your mouth may get dry. Chewing sugarless gum or sucking hard candy, and drinking plenty of water may help. Contact your doctor if the problem does not go away or is severe. Do not treat yourself for coughs, colds, or pain while you are taking this medicine without asking your doctor or health care professional for advice. Some ingredients may increase your blood pressure. What side effects may I notice from receiving this medicine? Side effects that you should report to your doctor or health care professional as soon as possible: -awareness of heart beating -blurred vision -headache -irregular heartbeat, palpitations, or chest pain -pounding in the ears -skin rash, hives Side effects that usually do not require medical attention (report to your doctor or health care professional if they continue or are bothersome): -change in  heart rate -chills -goose bumps -increased need to urinate -itching -stomach pain -tingling in the skin or scalp This list may not describe all possible side effects. Call your doctor for medical advice about side effects. You may report side effects to FDA at 1-800-FDA-1088. Where should I keep my medicine? Keep out of the reach of children. Store at room temperature between 15 and 30 degrees C (59 and 86 degrees F). Throw away any unused medicine after the expiration date. NOTE: This sheet is a summary. It may not cover all possible information. If you have questions about this medicine, talk to your doctor, pharmacist, or health care provider.  2018 Elsevier/Gold Standard (2008-05-24 13:51:24)

## 2018-03-24 NOTE — Telephone Encounter (Signed)
Spoke with Ander Purpura, NP- patient needs cbc, metc today. Lab encounter to be added to pt's sch.

## 2018-03-24 NOTE — Progress Notes (Signed)
Patient here as an acute add on for hypotension. He complains of being "lightheaded" but not dizzy and says he has some "balance issues". BP is low despite holding the Metoprolol and Losartan. He is still taking the Amlodipine. This has been going on for about 2 weeks. Wife says he has been c/o some kidney area pain (only has one kidney), but he denies having any pain at this moment.

## 2018-03-24 NOTE — Progress Notes (Signed)
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 Rogue Bussing, on 03/13/2018.  Patient with below history of renal cell cancer status post left nephrectomy with known medical stasis to lung.  Patient had multiple intolerances to previous TKIs.  March 2019 CT scan showed progression of lung lesions.  He is currently on axitinib 5mg  BID.  Oncology History   # MAY- June 2017- METASTATIC CLEAR CELL; LEFT RENAL CA/STAGE IV; [place]- G-3; INTERMEDIATE RISK;  [bil Pul lung nodules;incidental s/p Bx Dr.Byrnett/Dr.Oaks] s/p Cytoreductive nephrectomy; Dr.Brandon- [id number]; July 11th 2017 CT- Enlarging Lung nodules  # Aug 7th 2017- PAZOPANIB; STOP SEP 2017 [pancreatitis/poor tol]  # OCT 1 week- START NIVO q 2W x4; DEC 8th CT- "Progression"; Continue Opdivo;APRIL-MAY 2018- STABLE LUNG NODULES [stopped sec to severe cough; NO Pneumonitis]  # May 31st 2018- [place] 60mg /day; July 31st CT lung- improved lung nodules; Sep 1st week- cabo- 40mg /day [dose reduced sec to mult intol]; Oct 1st 2week-[place] 20mg /day;   # NOV 15th 2018- CT chest- slight progression of lung nodules [poor tol to higer doses];   # Nov 28th 2018- S[place] 50 mg 2w-on  &1 w-OFF;  # MARCH 2019- Progression; start Axitinib 5 mg BID  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 He states that he started noticing low blood pressures at home in the past 1 to 2 weeks prior to that he has had a history of hypertension and is under the care of Dr. Saralyn Pilar with [facility] as well as Dr. Netty Starring at [facility]  He keeps a blood pressure diary.  He is accompanied by his wife expresses concern that cough and shortness of breath and low blood pressures may interfere with upcoming travel for family wedding.  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  They have held his blood pressure medications for 1 week and are now seen more normal numbers for him in 140s/80-90s.  She is questioning if he has pneumonia. He has been taking metoprolol, losartan, and amlodipine. He has been holding metoprolol and losartan. Today he describes some feelings of 'lightheadedness' when low bps. No syncopal episodes. Has been eating and drinking normally. Exerting himself seems to make symptoms worse and symptoms improve with rest. His cough and sob with exertion are chronic and unchanged. His chronic neck pain that is also stable and unchanged.  Denies other bone pain. Chart review reveals history of orthostatic hypotension.    ECOG FS:0 - Asymptomatic  Review of systems- Review of Systems  Constitutional: Negative for chills, diaphoresis, fever, malaise/fatigue and weight loss.  HENT: Negative.   Eyes: Negative.   Respiratory: Positive for cough and shortness of breath (with exertion; no worse). Negative for hemoptysis, sputum production and wheezing.   Cardiovascular: Negative.   Gastrointestinal: Negative.   Genitourinary: Negative.   Musculoskeletal: Positive for neck pain.  Negative for back pain, falls, joint pain and myalgias.  Skin: Negative.   Neurological: Positive for dizziness and weakness (not currently). Negative for loss of consciousness.  Endo/Heme/Allergies: Positive for environmental allergies.  Psychiatric/Behavioral: The patient is not nervous/anxious.        Concerned that symptoms will interfere with upcoming travel plans and his ability to interact with family & friends     Current treatment- Axitinib 5mg  BID  Allergies  Allergen Reactions  . Lisinopril Swelling  . Other Itching    Nitroglycerin Patch.    Past Medical History:  Diagnosis Date  . CAD (coronary artery disease)   . Cancer ([place])    left arm  . COPD (chronic obstructive pulmonary disease) ([place])   . Hypertension   . Kidney stone   . Lung cancer ([place])   . Melanoma ([place]) 01/24/2016   right shoulder  . Thyroid nodule 02/02/2016   left BENIGN THYROID NODULE by FNA    Past Surgical History:  Procedure Laterality Date  . arm surgery Left    arm  . cardiac stents  2011   Angioplasty / Stenting Femoral-X2  . COLONOSCOPY  04/01/12  . CORONARY ANGIOPLASTY    . EXCISION MELANOMA WITH SENTINEL LYMPH NODE BIOPSY Right 01/24/2016   Procedure: EXCISION MELANOMA Right Shoulder;  Surgeon: Robert Bellow, MD;  Location: [facility] ORS;  Service: General;  Laterality: Right;  . LAPAROSCOPIC NEPHRECTOMY, HAND ASSISTED Left 04/24/2016   Procedure: HAND ASSISTED LAPAROSCOPIC NEPHRECTOMY;  Surgeon: Hollice Espy, MD;  Location: [facility] ORS;  Service: Urology;  Laterality: Left;  Marland [place] VIDEO ASSISTED THORACOSCOPY (VATS)/THOROCOTOMY Left 03/08/2016   Procedure: VIDEO ASSISTED THORACOSCOPY (VATS) with lung biopsy - Left ;  Surgeon: Robert Bellow, MD;  Location: [facility] ORS;  Service: General;  Laterality: Left;    Social History   Socioeconomic History  . Marital status: Married    Spouse name: Not on file  . Number of children: Not on file  . Years of education: Not on file  . Highest  education level: Not on file  Occupational History  . Not on file  Social Needs  . Financial resource strain: Not on file  . Food insecurity:    Worry: Not on file    Inability: Not on file  . Transportation needs:    Medical: Not on file    Non-medical: Not on file  Tobacco Use  . Smoking status: Former Smoker    Types: Cigarettes    Start date: 01/18/1956    Last attempt to quit: 01/17/1969    Years since quitting: 49.2  . Smokeless tobacco: Never Used  Substance and Sexual Activity  . Alcohol use: No    Alcohol/week: 0.0 oz    Comment: BEER OCC  . Drug use: No  . Sexual activity: Not on file  Lifestyle  . Physical activity:    Days per week: Not on file    Minutes per session: Not on file  . Stress: Not on file  Relationships  . Social connections:    Talks on phone: Not on file    Gets together: Not on file    Attends religious service: Not on file    Active member of club or  organization: Not on file    Attends meetings of clubs or organizations: Not on file    Relationship status: Not on file  . Intimate partner violence:    Fear of current or ex partner: Not on file    Emotionally abused: Not on file    Physically abused: Not on file    Forced sexual activity: Not on file  Other Topics Concern  . Not on file  Social History Narrative  . Not on file    Family History  Problem Relation Age of Onset  . Hodgkin's lymphoma Daughter 5  . Kidney cancer Neg Hx   . Kidney disease Neg Hx   . Prostate cancer Neg Hx      Current Outpatient Medications:  .  acetaminophen (TYLENOL) 500 MG tablet, Take 500 mg by mouth every 6 (six) hours as needed for moderate pain., Disp: , Rfl:  .  albuterol (PROVENTIL HFA;VENTOLIN HFA) 108 (90 Base) MCG/ACT inhaler, Inhale 2 puffs into the lungs every 6 (six) hours as needed for wheezing or shortness of breath., Disp: 1 Inhaler, Rfl: 2 .  amLODipine (NORVASC) 5 MG tablet, Take 1 tablet (5 mg total) by mouth 2 (two) times daily.,  Disp: 60 tablet, Rfl: 0 .  aspirin EC 81 MG tablet, Take 81 mg by mouth daily., Disp: , Rfl:  .  atorvastatin (LIPITOR) 40 MG tablet, Take 40 mg by mouth daily., Disp: , Rfl:  .  axitinib (INLYTA) 5 MG tablet, Take 1 tablet (5 mg total) by mouth 2 (two) times daily. (Patient taking differently: Take 5 mg by mouth daily. ), Disp: 60 tablet, Rfl: 4 .  busPIRone (BUSPAR) 5 MG tablet, Take 2 tablets by mouth daily., Disp: , Rfl:  .  clopidogrel (PLAVIX) 75 MG tablet, Take 75 mg by mouth daily., Disp: , Rfl:  .  docusate sodium (COLACE) 100 MG capsule, Take 1 capsule (100 mg total) by mouth 2 (two) times daily., Disp: 60 capsule, Rfl: 3 .  DULoxetine (CYMBALTA) 30 MG capsule, Take 2 capsules by mouth daily., Disp: , Rfl:  .  ferrous sulfate 325 (65 FE) MG EC tablet, Take 325 mg by mouth 3 (three) times daily with meals., Disp: , Rfl:  .  fluticasone (FLONASE) 50 MCG/ACT nasal spray, Place 2 sprays into both nostrils daily., Disp: 16 g, Rfl: 2 .  Fluticasone-Salmeterol (ADVAIR DISKUS) 500-50 MCG/DOSE AEPB, Inhale 1 puff into the lungs 2 (two) times daily., Disp: 60 each, Rfl: 11 .  ipratropium-albuterol (DUONEB) 0.5-2.5 (3) MG/3ML SOLN, Take 3 mLs by nebulization every 4 (four) hours as needed., Disp: 360 mL, Rfl: 3 .  loratadine (CLARITIN) 10 MG tablet, Take 10 mg by mouth daily., Disp: , Rfl:  .  MELATONIN PO, Take 10 mg by mouth at bedtime. , Disp: , Rfl:  .  Misc Natural Products (GLUCOSAMINE CHOND COMPLEX/MSM) TABS, Take 2 tablets by mouth daily., Disp: , Rfl:  .  montelukast (SINGULAIR) 10 MG tablet, Take 1 tablet (10 mg total) by mouth at bedtime., Disp: 30 tablet, Rfl: 3 .  nitroGLYCERIN (NITROSTAT) 0.4 MG SL tablet, Place 0.4 mg under the tongue every 5 (five) minutes as needed. , Disp: , Rfl:  .  Omega-3 Fatty Acids (FISH OIL) 1000 MG CPDR, Take 1 capsule by mouth daily., Disp: , Rfl:  .  tamsulosin (FLOMAX) 0.4 MG CAPS capsule, TAKE 1 CAPSULE (0.4 MG TOTAL) BY MOUTH EVERY MORNING., Disp: 30  capsule, Rfl: 1 .  Tiotropium Bromide Monohydrate (SPIRIVA RESPIMAT)  1.25 MCG/ACT AERS, Inhale 1.25 Act into the lungs 2 (two) times daily., Disp: 1 Inhaler, Rfl: 11 .  tiZANidine (ZANAFLEX) 4 MG tablet, Take 1 tablet (4 mg total) by mouth as needed for muscle spasms., Disp: 30 tablet, Rfl: 2 .  losartan (COZAAR) 50 MG tablet, Take 50 mg by mouth every morning. , Disp: , Rfl:  .  metoprolol succinate (TOPROL-XL) 50 MG 24 hr tablet, Take 50 mg by mouth every morning. , Disp: , Rfl:  .  midodrine (PROAMATINE) 2.5 MG tablet, Take 1 tablet (2.5 mg total) by mouth 2 (two) times daily with a meal., Disp: 60 tablet, Rfl: 0  Physical exam:  Vitals:   03/24/18 1009  BP: 103/65  Pulse: 63  Resp: 20  Temp: (!) 96.7 F (35.9 C)  TempSrc: Tympanic  SpO2: 98%  Weight: 203 lb 12.8 oz (92.4 kg)   Physical Exam  Constitutional: He is oriented to person, place, and time. He appears well-developed and well-nourished. He has a sickly appearance. No distress.  HENT:  Head: Normocephalic and atraumatic.  Nose: Nose normal.  Mouth/Throat: Oropharynx is clear and moist. No oropharyngeal exudate.  Eyes: Pupils are equal, round, and reactive to light. Conjunctivae and EOM are normal. No scleral icterus.  Neck: Normal range of motion. Neck supple.  Cardiovascular: Normal rate, regular rhythm, normal heart sounds and intact distal pulses.  Pulmonary/Chest: Effort normal and breath sounds normal. No respiratory distress. He has no wheezes.  Abdominal: Soft. Bowel sounds are normal. He exhibits no distension. There is no tenderness. There is no rebound.  Musculoskeletal: He exhibits no edema or deformity.  Neurological: He is alert and oriented to person, place, and time.  Skin: Skin is warm and dry.  Psychiatric: He has a normal mood and affect. His behavior is normal.     CMP Latest Ref Rng & Units 03/24/2018  Glucose 65 - 99 mg/dL 188(H)  BUN 6 - 20 mg/dL 14  Creatinine 0.61 - 1.24 mg/dL 1.41(H)  Sodium  135 - 145 mmol/L 133(L)  Potassium 3.5 - 5.1 mmol/L 3.9  Chloride 101 - 111 mmol/L 98(L)  CO2 22 - 32 mmol/L 25  Calcium 8.9 - 10.3 mg/dL 8.9  Total Protein 6.5 - 8.1 g/dL 6.8  Total Bilirubin 0.3 - 1.2 mg/dL 0.8  Alkaline Phos 38 - 126 U/L 104  AST 15 - 41 U/L 23  ALT 17 - 63 U/L 19   CBC Latest Ref Rng & Units 03/24/2018  WBC 3.8 - 10.6 K/uL 7.1  Hemoglobin 13.0 - 17.0 g/dL 15.4  Hematocrit 40.0 - 52.0 % 41.6  Platelets 150 - 440 K/uL 174    No images are attached to the encounter.  Dg Chest 2 View  Result Date: 03/24/2018 CLINICAL DATA:  Cough, shortness of breath, history lung cancer, melanoma, chemotherapy, COPD, coronary artery disease EXAM: CHEST - 2 VIEW COMPARISON:  CT chest 01/24/2018 FINDINGS: Normal heart size, mediastinal contours, and pulmonary vascularity. 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 Focus of nodularity at the LEFT apex on the prior CT exam is likely still present, suboptimally visualized due to superimposed osseous structures. Decreased prominence of a RIGHT upper lobe nodular density since previous study now measuring approximately 11 mm diameter. Peribronchial thickening with RIGHT basilar atelectasis. No acute infiltrate, pleural effusion or pneumothorax. 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 IMPRESSION: Chronic bronchitic changes with RIGHT basilar atelectasis. Decreased size of RIGHT upper lobe nodule since  prior exam. Probable persistence of LEFT upper lobe nodule. Electronically Signed   By: Lavonia Dana M.D.   On: 03/24/2018 10:00     Assessment and plan- Patient is a 77 y.o. male with left kidney cancer metastatic to lung s/p  Nephrectomy who [facility] for complaints of low blood pressure and dizziness.  Patient  1.  Left kidney cancer metastatic to lung-intermediate risk, most recently on sunitinib.   01/2018 CT shows progression of lung lesions.  Currently on axitinib 5mg  BID.   2. Labile Blood Pressures-orthostatic vital signs negative today but reports consistent of orthostatic hypotension. Today, BP soft but wnl. Heart rate low normal. Discussed normal blood pressure ranges. Encouraged to rest if symptomatic. Continue holding BP medications as discussed with Dr. Rogue Bussing.  previously, patient on midodrine TID, BP elevated, midodrine was stopped. Will restart Midodrine at BID dosing and asked pt to continue BP diary and monitor symptoms.   3. Dehydration- Cr 1.41, BUN 14. Roughly baseline for patient and likely r/t poor oral intake. 1L IV fluids given in clinic. Patient then stating he feels well and requesting discharge home.   4. Weight Loss/Poor Nutrition- approximately 6 lb weight loss in past 1-2 weeks.  Discussed that symptoms may be related to poor fluid intake and or poor nutrition.  Suspect this  may be contributory to weakness and dizziness with exertion.  Encouraged p.o. intake and protein with every meal.  If weight loss continues, could consider referral to dietitian for evaluation.   5. Chronic Cough- chest x-ray independently reviewed and findings discussed with patient including decrease in size of right upper lobe nodule and persistence of left upper lobe nodule.  Discussed chronic bronchitic changes and encouraged continued use of daily maintenance inhalers and albuterol for acute symptoms.  Discussed right bibasilar atelectasis and encouraged regular deep breathing exercises.  No evidence of infection.  Afebrile.  SpO2 98%. Home oxygen. Continue to monitor.  6. Hyperglycemia- no prior history of diabetes but random blood glucose of 188 today. Chart review reveals previous random sugars >200. hA1c in 05/2017 6.3, improved to 5.6 10/2017. Encouraged reducing consumption of high sugar and high carbohydrate meals and following up with PCP for continued monitoring.    rtc on 04/04/18 for labs and re-evaluation with Dr. Rogue Bussing  or sooner if symptoms persist or worsen.   Visit Diagnosis 1. Cancer of left kidney excluding renal pelvis ([place])   2. Orthostatic hypotension   3. Dehydration   4. Altered nutrition in cancer patient ([place])   5. Pulmonary nodules/lesions, multiple   6. Hyperglycemia     Patient expressed understanding and was in agreement with this plan. He also understands that He can call clinic at any time with any questions, concerns, or complaints.    Beckey Rutter, DNP, AGNP-C [place] at [facility] [phone] (work cell) [phone] (office) 04/01/18 10:09 AM

## 2018-03-25 NOTE — Telephone Encounter (Signed)
Please inform pt/family the results of cxr. Thx

## 2018-03-25 NOTE — Telephone Encounter (Signed)
Patient seen in clinic yesterday, 03/24/18. Results of chest x-ray discussed at that time.

## 2018-04-04 ENCOUNTER — Inpatient Hospital Stay (HOSPITAL_BASED_OUTPATIENT_CLINIC_OR_DEPARTMENT_OTHER): Payer: PPO | Admitting: Internal Medicine

## 2018-04-04 ENCOUNTER — Inpatient Hospital Stay: Payer: PPO

## 2018-04-04 ENCOUNTER — Encounter: Payer: Self-pay | Admitting: Internal Medicine

## 2018-04-04 ENCOUNTER — Other Ambulatory Visit: Payer: Self-pay

## 2018-04-04 VITALS — BP 154/91 | HR 57 | Temp 97.7°F | Resp 18 | Ht 73.0 in | Wt 200.2 lb

## 2018-04-04 DIAGNOSIS — C7800 Secondary malignant neoplasm of unspecified lung: Secondary | ICD-10-CM | POA: Diagnosis not present

## 2018-04-04 DIAGNOSIS — Z87891 Personal history of nicotine dependence: Secondary | ICD-10-CM

## 2018-04-04 DIAGNOSIS — E860 Dehydration: Secondary | ICD-10-CM

## 2018-04-04 DIAGNOSIS — Z79899 Other long term (current) drug therapy: Secondary | ICD-10-CM

## 2018-04-04 DIAGNOSIS — C642 Malignant neoplasm of left kidney, except renal pelvis: Secondary | ICD-10-CM | POA: Diagnosis not present

## 2018-04-04 DIAGNOSIS — Z806 Family history of leukemia: Secondary | ICD-10-CM

## 2018-04-04 DIAGNOSIS — M542 Cervicalgia: Secondary | ICD-10-CM | POA: Diagnosis not present

## 2018-04-04 DIAGNOSIS — Z87442 Personal history of urinary calculi: Secondary | ICD-10-CM

## 2018-04-04 DIAGNOSIS — R739 Hyperglycemia, unspecified: Secondary | ICD-10-CM

## 2018-04-04 DIAGNOSIS — E041 Nontoxic single thyroid nodule: Secondary | ICD-10-CM

## 2018-04-04 DIAGNOSIS — R0602 Shortness of breath: Secondary | ICD-10-CM

## 2018-04-04 DIAGNOSIS — I2510 Atherosclerotic heart disease of native coronary artery without angina pectoris: Secondary | ICD-10-CM

## 2018-04-04 DIAGNOSIS — I10 Essential (primary) hypertension: Secondary | ICD-10-CM

## 2018-04-04 DIAGNOSIS — Z85820 Personal history of malignant melanoma of skin: Secondary | ICD-10-CM

## 2018-04-04 DIAGNOSIS — I959 Hypotension, unspecified: Secondary | ICD-10-CM | POA: Diagnosis not present

## 2018-04-04 DIAGNOSIS — J449 Chronic obstructive pulmonary disease, unspecified: Secondary | ICD-10-CM | POA: Diagnosis not present

## 2018-04-04 DIAGNOSIS — R05 Cough: Secondary | ICD-10-CM

## 2018-04-04 DIAGNOSIS — R21 Rash and other nonspecific skin eruption: Secondary | ICD-10-CM

## 2018-04-04 LAB — COMPREHENSIVE METABOLIC PANEL
ALBUMIN: 4.1 g/dL (ref 3.5–5.0)
ALT: 22 U/L (ref 17–63)
AST: 25 U/L (ref 15–41)
Alkaline Phosphatase: 121 U/L (ref 38–126)
Anion gap: 9 (ref 5–15)
BILIRUBIN TOTAL: 0.8 mg/dL (ref 0.3–1.2)
BUN: 13 mg/dL (ref 6–20)
CO2: 28 mmol/L (ref 22–32)
CREATININE: 1.38 mg/dL — AB (ref 0.61–1.24)
Calcium: 9.8 mg/dL (ref 8.9–10.3)
Chloride: 99 mmol/L — ABNORMAL LOW (ref 101–111)
GFR calc Af Amer: 56 mL/min — ABNORMAL LOW (ref >60)
GFR calc non Af Amer: 48 mL/min — ABNORMAL LOW (ref >60)
GLUCOSE: 149 mg/dL — AB (ref 65–99)
POTASSIUM: 3.8 mmol/L (ref 3.5–5.1)
Sodium: 136 mmol/L (ref 135–145)
TOTAL PROTEIN: 7.5 g/dL (ref 6.5–8.1)

## 2018-04-04 LAB — CBC WITH DIFFERENTIAL/PLATELET
BASOS PCT: 1 %
Basophils Absolute: 0 10*3/uL (ref 0–0.1)
Eosinophils Absolute: 0.2 10*3/uL (ref 0–0.7)
Eosinophils Relative: 2 %
HEMATOCRIT: 44.5 % (ref 40.0–52.0)
HEMOGLOBIN: 16.2 g/dL (ref 13.0–18.0)
LYMPHS PCT: 19 %
Lymphs Abs: 1.5 10*3/uL (ref 1.0–3.6)
MCH: 33.9 pg (ref 26.0–34.0)
MCHC: 36.3 g/dL — ABNORMAL HIGH (ref 32.0–36.0)
MCV: 93.4 fL (ref 80.0–100.0)
MONO ABS: 0.7 10*3/uL (ref 0.2–1.0)
Monocytes Relative: 9 %
NEUTROS ABS: 5.7 10*3/uL (ref 1.4–6.5)
Neutrophils Relative %: 69 %
Platelets: 181 10*3/uL (ref 150–440)
RBC: 4.76 MIL/uL (ref 4.40–5.90)
RDW: 14 % (ref 11.5–14.5)
WBC: 8.1 10*3/uL (ref 3.8–10.6)

## 2018-04-04 NOTE — Progress Notes (Signed)
[facility] OFFICE PROGRESS NOTE  Patient Care Team: Dion Body, MD as PCP - General (Family Medicine) Jannet Mantis, MD (Dermatology) Bary Castilla Forest Gleason, MD (General Surgery) Hollice Espy, MD as Consulting Physician (Urology)  Cancer Staging No matching staging information was found for the patient.   Oncology History   # MAY- June 2017- METASTATIC CLEAR CELL; LEFT RENAL CA/STAGE IV; [place]- G-3; INTERMEDIATE RISK;  [bil Pul lung nodules;incidental s/p Bx Dr.Byrnett/Dr.Oaks] s/p Cytoreductive nephrectomy; Dr.Brandon- [id number]; July 11th 2017 CT- Enlarging Lung nodules  # Aug 7th 2017- PAZOPANIB; STOP SEP 2017 [pancreatitis/poor tol]  # OCT 1 week- START NIVO q 2W x4; DEC 8th CT- "Progression"; Continue Opdivo;APRIL-MAY 2018- STABLE LUNG NODULES [stopped sec to severe cough; NO Pneumonitis]  # May 31st 2018- Oasis 60mg /day; July 31st CT lung- improved lung nodules; Sep 1st week- cabo- 40mg /day [dose reduced sec to mult intol]; Oct 1st 2week-Cabo 20mg /day;   # NOV 15th 2018- CT chest- slight progression of lung nodules [poor tol to higer doses];   # Nov 28th 2018- SUTENT 50 mg 2w-on  &1 w-OFF;  # MARCH 2019- Progression; start Axitinib 5 mg BID  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  male with metastatic kidney cancer on axitinib is here for follow-up  Patient complains of continued fatigue.  Also complains of rash/pain bottom of the feet.  Complains of neck pain not improving.  Denies  any headaches.  Continues to have chronic shortness of breath chronic cough.  Review of Systems  Constitutional: Positive for malaise/fatigue. Negative for chills, diaphoresis, fever and weight loss.  HENT: Negative for nosebleeds and sore throat.   Eyes: Negative for double vision.  Respiratory: Positive for cough and shortness of breath. Negative for hemoptysis, sputum production and wheezing.   Cardiovascular: Negative for chest pain, palpitations, orthopnea and leg swelling.  Gastrointestinal: Negative for abdominal pain, blood in stool, constipation, diarrhea, heartburn, melena, nausea and vomiting.  Genitourinary: Negative for dysuria, frequency and urgency.  Musculoskeletal: Positive for back pain and joint pain.  Skin: Positive for rash. Negative for itching.  Neurological: Negative for dizziness, tingling, focal weakness, weakness and headaches.  Endo/Heme/Allergies: Does not bruise/bleed easily.  Psychiatric/Behavioral: Negative for depression. The patient is not nervous/anxious and does not have insomnia.       PAST MEDICAL HISTORY :  Past Medical History:  Diagnosis Date  . CAD (coronary artery disease)   . Cancer ([place])    left arm  . COPD (chronic obstructive pulmonary disease) ([place])   . Hypertension   . Kidney stone   . Lung cancer ([place])   . Melanoma ([place]) 01/24/2016   right shoulder  . Thyroid nodule 02/02/2016   left BENIGN THYROID NODULE by FNA    PAST SURGICAL HISTORY :   Past Surgical History:  Procedure Laterality Date  . arm surgery Left    arm  . cardiac stents  2011   Angioplasty / Stenting Femoral-X2  . COLONOSCOPY  04/01/12  . CORONARY ANGIOPLASTY    . EXCISION MELANOMA WITH SENTINEL LYMPH NODE BIOPSY Right 01/24/2016  Procedure: EXCISION MELANOMA Right Shoulder;  Surgeon: Robert Bellow, MD;  Location: [facility] ORS;  Service: General;  Laterality: Right;  . LAPAROSCOPIC NEPHRECTOMY, HAND ASSISTED Left 04/24/2016   Procedure: HAND ASSISTED  LAPAROSCOPIC NEPHRECTOMY;  Surgeon: Hollice Espy, MD;  Location: [facility] ORS;  Service: Urology;  Laterality: Left;  Marland Kitchen VIDEO ASSISTED THORACOSCOPY (VATS)/THOROCOTOMY Left 03/08/2016   Procedure: VIDEO ASSISTED THORACOSCOPY (VATS) with lung biopsy - Left ;  Surgeon: Robert Bellow, MD;  Location: [facility] ORS;  Service: General;  Laterality: Left;    FAMILY HISTORY :   Family History  Problem Relation Age of Onset  . Hodgkin's lymphoma Daughter 5  . Kidney cancer Neg Hx   . Kidney disease Neg Hx   . Prostate cancer Neg Hx     SOCIAL HISTORY:   Social History   Tobacco Use  . Smoking status: Former Smoker    Types: Cigarettes    Start date: 01/18/1956    Last attempt to quit: 01/17/1969    Years since quitting: 49.2  . Smokeless tobacco: Never Used  Substance Use Topics  . Alcohol use: No    Alcohol/week: 0.0 oz    Comment: BEER OCC  . Drug use: No    ALLERGIES:  is allergic to lisinopril and other.  MEDICATIONS:  Current Outpatient Medications  Medication Sig Dispense Refill  . amLODipine (NORVASC) 5 MG tablet Take 1 tablet (5 mg total) by mouth 2 (two) times daily. 60 tablet 0  . aspirin EC 81 MG tablet Take 81 mg by mouth daily.    Marland Kitchen atorvastatin (LIPITOR) 40 MG tablet Take 40 mg by mouth daily.    Marland Kitchen axitinib (INLYTA) 5 MG tablet Take 1 tablet (5 mg total) by mouth 2 (two) times daily. (Patient taking differently: Take 5 mg by mouth daily. ) 60 tablet 4  . busPIRone (BUSPAR) 5 MG tablet Take 2 tablets by mouth daily.    . clopidogrel (PLAVIX) 75 MG tablet Take 75 mg by mouth daily.    Marland Kitchen docusate sodium (COLACE) 100 MG capsule Take 1 capsule (100 mg total) by mouth 2 (two) times daily. 60 capsule 3  . DULoxetine (CYMBALTA) 30 MG capsule Take 2 capsules by mouth daily.    . ferrous sulfate 325 (65 FE) MG EC tablet Take 325 mg by mouth 3 (three) times daily with meals.    . fluticasone (FLONASE) 50 MCG/ACT nasal spray Place 2 sprays into both nostrils daily. 16 g 2  .  Fluticasone-Salmeterol (ADVAIR DISKUS) 500-50 MCG/DOSE AEPB Inhale 1 puff into the lungs 2 (two) times daily. 60 each 11  . ipratropium-albuterol (DUONEB) 0.5-2.5 (3) MG/3ML SOLN Take 3 mLs by nebulization every 4 (four) hours as needed. 360 mL 3  . loratadine (CLARITIN) 10 MG tablet Take 10 mg by mouth daily.    Marland Kitchen losartan (COZAAR) 50 MG tablet Take 50 mg by mouth every morning.     Marland Kitchen MELATONIN PO Take 10 mg by mouth at bedtime.     . metoprolol succinate (TOPROL-XL) 50 MG 24 hr tablet Take 50 mg by mouth every morning.     . midodrine (PROAMATINE) 2.5 MG tablet Take 1 tablet (2.5 mg total) by mouth 2 (two) times daily with a meal. 60 tablet 0  . Misc Natural Products (GLUCOSAMINE CHOND COMPLEX/MSM) TABS Take 2 tablets by mouth daily.    . montelukast (SINGULAIR) 10 MG tablet Take 1 tablet (10 mg total) by mouth at bedtime. 30 tablet 3  . nitroGLYCERIN (NITROSTAT)  0.4 MG SL tablet Place 0.4 mg under the tongue every 5 (five) minutes as needed.     . Omega-3 Fatty Acids (FISH OIL) 1000 MG CPDR Take 1 capsule by mouth daily.    . tamsulosin (FLOMAX) 0.4 MG CAPS capsule TAKE 1 CAPSULE (0.4 MG TOTAL) BY MOUTH EVERY MORNING. 30 capsule 1  . Tiotropium Bromide Monohydrate (SPIRIVA RESPIMAT) 1.25 MCG/ACT AERS Inhale 1.25 Act into the lungs 2 (two) times daily. 1 Inhaler 11  . tiZANidine (ZANAFLEX) 4 MG tablet Take 1 tablet (4 mg total) by mouth as needed for muscle spasms. 30 tablet 2  . acetaminophen (TYLENOL) 500 MG tablet Take 500 mg by mouth every 6 (six) hours as needed for moderate pain.    Marland Kitchen albuterol (PROVENTIL HFA;VENTOLIN HFA) 108 (90 Base) MCG/ACT inhaler Inhale 2 puffs into the lungs every 6 (six) hours as needed for wheezing or shortness of breath. (Patient not taking: Reported on 04/04/2018) 1 Inhaler 2   No current facility-administered medications for this visit.     PHYSICAL EXAMINATION: ECOG PERFORMANCE STATUS: 1 - Symptomatic but completely ambulatory  BP (!) 154/91 (BP Location:  Left Arm, Patient Position: Sitting)   Pulse (!) 57   Temp 97.7 F (36.5 C) (Tympanic)   Resp 18   Ht 6\' 1"  (1.854 m)   Wt 200 lb 3.2 oz (90.8 kg)   SpO2 97%   BMI 26.41 kg/m   Filed Weights   04/04/18 1012  Weight: 200 lb 3.2 oz (90.8 kg)    Physical Exam  Constitutional: He is oriented to person, place, and time and well-developed, well-nourished, and in no distress.  HENT:  Head: Normocephalic and atraumatic.  Mouth/Throat: Oropharynx is clear and moist. No oropharyngeal exudate.  Eyes: Pupils are equal, round, and reactive to light.  Neck: Normal range of motion. Neck supple.  Cardiovascular: Normal rate and regular rhythm.  Pulmonary/Chest: He has decreased breath sounds. He has no wheezes.  Abdominal: Soft. Bowel sounds are normal. He exhibits no distension and no mass. There is no tenderness. There is no rebound and no guarding.  Musculoskeletal: Normal range of motion. He exhibits no edema or tenderness.  Neurological: He is alert and oriented to person, place, and time.  Skin: Skin is warm.  Psychiatric: Affect normal.  Mild tenderness at the bottom of the feet.  No disclamation.    LABORATORY DATA:  I have reviewed the data as listed    Component Value Date/Time   NA 136 04/04/2018 0955   NA 146 (H) 05/25/2016 1514   K 3.8 04/04/2018 0955   CL 99 (L) 04/04/2018 0955   CO2 28 04/04/2018 0955   GLUCOSE 149 (H) 04/04/2018 0955   BUN 13 04/04/2018 0955   BUN 19 05/25/2016 1514   CREATININE 1.38 (H) 04/04/2018 0955   CALCIUM 9.8 04/04/2018 0955   PROT 7.5 04/04/2018 0955   ALBUMIN 4.1 04/04/2018 0955   AST 25 04/04/2018 0955   ALT 22 04/04/2018 0955   ALKPHOS 121 04/04/2018 0955   BILITOT 0.8 04/04/2018 0955   GFRNONAA 48 (L) 04/04/2018 0955   GFRAA 56 (L) 04/04/2018 0955    No results found for: SPEP, UPEP  Lab Results  Component Value Date   WBC 8.1 04/04/2018   NEUTROABS 5.7 04/04/2018   HGB 16.2 04/04/2018   HCT 44.5 04/04/2018   MCV 93.4  04/04/2018   PLT 181 04/04/2018      Chemistry      Component Value Date/Time  NA 136 04/04/2018 0955   NA 146 (H) 05/25/2016 1514   K 3.8 04/04/2018 0955   CL 99 (L) 04/04/2018 0955   CO2 28 04/04/2018 0955   BUN 13 04/04/2018 0955   BUN 19 05/25/2016 1514   CREATININE 1.38 (H) 04/04/2018 0955      Component Value Date/Time   CALCIUM 9.8 04/04/2018 0955   ALKPHOS 121 04/04/2018 0955   AST 25 04/04/2018 0955   ALT 22 04/04/2018 0955   BILITOT 0.8 04/04/2018 0955       RADIOGRAPHIC STUDIES: I have personally reviewed the radiological images as listed and agreed with the findings in the report. No results found.   ASSESSMENT & PLAN:  Cancer of left kidney excluding renal pelvis [facility])  # Left kidney cancer metastatic to the lung  March 2019 CT scan shows progressive lung lesions.   #Stable; continue axitinib 5 mg twice daily; tolerated with mild to moderate side effects (see discussion below)  #Fatigue secondary to axitinib; worsened recommend [facility] care program  #Rash on soles-second axitinib grade 1-2; worsened; urea cream/Vaseline; insoles; thick sock.  #COPD/chronic cough shortness of breath stable;   #Neck pain likely arthritis; muscle relaxant; worse; likely second to arthritis.  #Left knee pain-arthritis worsened; recommend follow-up with local orthopedic; recommend local injections; recommend holding off replacement awaiting CT of the chest.  #Poorly controlled blood pressure/labile blood pressures- 150/ 90s;  Will not change; ec to orthoastatis.   #Follow-up in approximately 3 weeks; labs; [facility] care program; CT chest.    Orders Placed This Encounter  Procedures  . CT CHEST WO CONTRAST    Standing Status:   Future    Standing Expiration Date:   04/05/2019    Order Specific Question:   Preferred imaging location?    Answer:   [facility]    Order Specific Question:   Radiology Contrast Protocol - do NOT remove file path    Answer:    [email or web address]  . CBC with Differential/Platelet    Standing Status:   Future    Standing Expiration Date:   04/05/2019  . Basic metabolic panel    Standing Status:   Future    Standing Expiration Date:   04/05/2019  . Lactate dehydrogenase    Standing Status:   Future    Standing Expiration Date:   04/05/2019   All questions were answered. The patient knows to call the clinic with any problems, questions or concerns.      Cammie Sickle, MD 04/04/2018 1:43 PM

## 2018-04-04 NOTE — Progress Notes (Signed)
Blisters noted to bottom of bilateral feet

## 2018-04-04 NOTE — Assessment & Plan Note (Addendum)
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   #Stable; continue axitinib 5 mg twice daily; tolerated with mild to moderate side effects (see discussion below)  #Fatigue secondary to axitinib; worsened recommend [facility] care program  #Rash on soles-second axitinib grade 1-2; worsened; urea cream/Vaseline; insoles; thick sock.  #COPD/chronic cough shortness of breath stable;   #Neck pain likely arthritis; muscle relaxant; worse; likely second to arthritis.  #Left knee pain-arthritis worsened; recommend follow-up with local orthopedic; recommend local injections; recommend holding off replacement awaiting CT of the chest.  #Poorly controlled blood pressure/labile blood pressures- 150/ 90s;  Will not change; ec to orthoastatis.   #Follow-up in approximately 3 weeks; labs; [facility] care program; CT chest.

## 2018-04-07 ENCOUNTER — Inpatient Hospital Stay: Payer: PPO

## 2018-04-07 ENCOUNTER — Other Ambulatory Visit: Payer: Self-pay | Admitting: *Deleted

## 2018-04-07 ENCOUNTER — Inpatient Hospital Stay (HOSPITAL_BASED_OUTPATIENT_CLINIC_OR_DEPARTMENT_OTHER): Payer: PPO | Admitting: Nurse Practitioner

## 2018-04-07 VITALS — BP 112/76 | HR 56 | Temp 96.8°F | Resp 18 | Wt 196.0 lb

## 2018-04-07 DIAGNOSIS — I10 Essential (primary) hypertension: Secondary | ICD-10-CM | POA: Diagnosis not present

## 2018-04-07 DIAGNOSIS — Z85820 Personal history of malignant melanoma of skin: Secondary | ICD-10-CM

## 2018-04-07 DIAGNOSIS — J449 Chronic obstructive pulmonary disease, unspecified: Secondary | ICD-10-CM | POA: Diagnosis not present

## 2018-04-07 DIAGNOSIS — R21 Rash and other nonspecific skin eruption: Secondary | ICD-10-CM | POA: Diagnosis not present

## 2018-04-07 DIAGNOSIS — C642 Malignant neoplasm of left kidney, except renal pelvis: Secondary | ICD-10-CM | POA: Diagnosis not present

## 2018-04-07 DIAGNOSIS — R531 Weakness: Secondary | ICD-10-CM

## 2018-04-07 DIAGNOSIS — M542 Cervicalgia: Secondary | ICD-10-CM

## 2018-04-07 DIAGNOSIS — Z806 Family history of leukemia: Secondary | ICD-10-CM

## 2018-04-07 DIAGNOSIS — I959 Hypotension, unspecified: Secondary | ICD-10-CM | POA: Diagnosis not present

## 2018-04-07 DIAGNOSIS — R739 Hyperglycemia, unspecified: Secondary | ICD-10-CM

## 2018-04-07 DIAGNOSIS — Z87442 Personal history of urinary calculi: Secondary | ICD-10-CM

## 2018-04-07 DIAGNOSIS — I2510 Atherosclerotic heart disease of native coronary artery without angina pectoris: Secondary | ICD-10-CM

## 2018-04-07 DIAGNOSIS — R530 Neoplastic (malignant) related fatigue: Secondary | ICD-10-CM

## 2018-04-07 DIAGNOSIS — C7800 Secondary malignant neoplasm of unspecified lung: Secondary | ICD-10-CM

## 2018-04-07 DIAGNOSIS — R300 Dysuria: Secondary | ICD-10-CM

## 2018-04-07 DIAGNOSIS — C439 Malignant melanoma of skin, unspecified: Secondary | ICD-10-CM

## 2018-04-07 DIAGNOSIS — E041 Nontoxic single thyroid nodule: Secondary | ICD-10-CM | POA: Diagnosis not present

## 2018-04-07 DIAGNOSIS — J069 Acute upper respiratory infection, unspecified: Secondary | ICD-10-CM | POA: Diagnosis not present

## 2018-04-07 DIAGNOSIS — E860 Dehydration: Secondary | ICD-10-CM | POA: Diagnosis not present

## 2018-04-07 DIAGNOSIS — C44622 Squamous cell carcinoma of skin of right upper limb, including shoulder: Secondary | ICD-10-CM

## 2018-04-07 DIAGNOSIS — Z87891 Personal history of nicotine dependence: Secondary | ICD-10-CM

## 2018-04-07 DIAGNOSIS — Z79899 Other long term (current) drug therapy: Secondary | ICD-10-CM

## 2018-04-07 LAB — COMPREHENSIVE METABOLIC PANEL
ALT: 17 U/L (ref 17–63)
AST: 21 U/L (ref 15–41)
Albumin: 4 g/dL (ref 3.5–5.0)
Alkaline Phosphatase: 110 U/L (ref 38–126)
Anion gap: 9 (ref 5–15)
BILIRUBIN TOTAL: 1.1 mg/dL (ref 0.3–1.2)
BUN: 16 mg/dL (ref 6–20)
CALCIUM: 9.7 mg/dL (ref 8.9–10.3)
CHLORIDE: 98 mmol/L — AB (ref 101–111)
CO2: 27 mmol/L (ref 22–32)
CREATININE: 1.32 mg/dL — AB (ref 0.61–1.24)
GFR calc Af Amer: 59 mL/min — ABNORMAL LOW (ref >60)
GFR, EST NON AFRICAN AMERICAN: 51 mL/min — AB (ref >60)
Glucose, Bld: 142 mg/dL — ABNORMAL HIGH (ref 65–99)
Potassium: 4.6 mmol/L (ref 3.5–5.1)
Sodium: 134 mmol/L — ABNORMAL LOW (ref 135–145)
TOTAL PROTEIN: 7.5 g/dL (ref 6.5–8.1)

## 2018-04-07 LAB — URINALYSIS, COMPLETE (UACMP) WITH MICROSCOPIC
BACTERIA UA: NONE SEEN
BILIRUBIN URINE: NEGATIVE
Glucose, UA: NEGATIVE mg/dL
KETONES UR: NEGATIVE mg/dL
LEUKOCYTES UA: NEGATIVE
Nitrite: NEGATIVE
PH: 5 (ref 5.0–8.0)
Protein, ur: 30 mg/dL — AB
SQUAMOUS EPITHELIAL / LPF: NONE SEEN (ref 0–5)
Specific Gravity, Urine: 1.015 (ref 1.005–1.030)
WBC, UA: NONE SEEN WBC/hpf (ref 0–5)

## 2018-04-07 LAB — MAGNESIUM: Magnesium: 1.9 mg/dL (ref 1.7–2.4)

## 2018-04-07 LAB — CBC WITH DIFFERENTIAL/PLATELET
Basophils Absolute: 0.1 K/uL (ref 0–0.1)
Basophils Relative: 1 %
Eosinophils Absolute: 0.1 K/uL (ref 0–0.7)
Eosinophils Relative: 2 %
HCT: 45.6 % (ref 40.0–52.0)
Hemoglobin: 16.4 g/dL (ref 13.0–18.0)
Lymphocytes Relative: 20 %
Lymphs Abs: 2 K/uL (ref 1.0–3.6)
MCH: 33.6 pg (ref 26.0–34.0)
MCHC: 36 g/dL (ref 32.0–36.0)
MCV: 93.3 fL (ref 80.0–100.0)
Monocytes Absolute: 0.8 K/uL (ref 0.2–1.0)
Monocytes Relative: 8 %
Neutro Abs: 6.8 K/uL — ABNORMAL HIGH (ref 1.4–6.5)
Neutrophils Relative %: 69 %
Platelets: 190 K/uL (ref 150–440)
RBC: 4.88 MIL/uL (ref 4.40–5.90)
RDW: 14 % (ref 11.5–14.5)
WBC: 9.8 K/uL (ref 3.8–10.6)

## 2018-04-07 MED ORDER — SODIUM CHLORIDE 0.9 % IV SOLN
Freq: Once | INTRAVENOUS | Status: AC
  Administered 2018-04-07: 11:00:00 via INTRAVENOUS
  Filled 2018-04-07: qty 1000

## 2018-04-07 NOTE — Progress Notes (Signed)
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 Rogue Bussing, on 03/13/2018.  Patient with below history of renal cell cancer status post left nephrectomy with known medical stasis to lung.  Patient had multiple intolerances to previous TKIs.  March 2019 CT scan showed progression of lung lesions.  He is currently on axitinib 5mg  BID.   Oncology History   # MAY- June 2017- METASTATIC CLEAR CELL; LEFT RENAL CA/STAGE IV; [place]- G-3; INTERMEDIATE RISK;  [bil Pul lung nodules;incidental s/p Bx Dr.Byrnett/Dr.Oaks] s/p Cytoreductive nephrectomy; Dr.Brandon- [id number]; July 11th 2017 CT- Enlarging Lung nodules  # Aug 7th 2017- PAZOPANIB; STOP SEP 2017 [pancreatitis/poor tol]  # OCT 1 week- START NIVO q 2W x4; DEC 8th CT- "Progression"; Continue Opdivo;APRIL-MAY 2018- STABLE LUNG NODULES [stopped sec to severe cough; NO Pneumonitis]  # May 31st 2018- [place] 60mg /day; July 31st CT lung- improved lung nodules; Sep 1st week- cabo- 40mg /day [dose reduced sec to mult intol]; Oct 1st 2week-[place] 20mg /day;   # NOV 15th 2018- CT chest- slight progression of lung nodules [poor tol to higer doses];   # Nov 28th 2018- SUTENT 50 mg 2w-on  &1 w-OFF;  # MARCH 2019- Progression; start Axitinib 5 mg BID  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  He continues to have fluctuating blood pressures and has returned to taking 2-3 medications for high blood pressure but wife is unsure of parameters or which medications he has been taking. Unsure if he has taken midodrine for low blood pressure.  He continues to feel weak and fatigued.  Symptoms are worse with activity.  His symptoms do improve with IV fluids.  Exertion makes symptoms worse.  Concerned he has a upper respiratory infection.   ECOG FS:0 - Asymptomatic  Review of systems- Review of Systems  Constitutional: Positive for chills and malaise/fatigue. Negative for diaphoresis, fever and weight loss.  HENT: Positive for congestion and sinus pain.   Eyes: Negative.   Respiratory: Positive for cough and shortness of breath (with exertion; no worse). Negative for hemoptysis, sputum production and wheezing.   Cardiovascular: Negative.   Gastrointestinal: Negative.   Genitourinary: Negative.   Musculoskeletal: Positive for neck pain. Negative for back pain, falls, joint pain and myalgias.  Skin: Negative.   Neurological: Positive for dizziness and weakness (not currently). Negative for loss of consciousness.  Endo/Heme/Allergies: Positive for environmental allergies.  Psychiatric/Behavioral: The patient is not nervous/anxious.     Current treatment- Axitinib 5mg  BID  Allergies  Allergen Reactions  .  Lisinopril Swelling  . Other Itching    Nitroglycerin Patch.    Past Medical History:  Diagnosis Date  .  CAD (coronary artery disease)   . Cancer ([place])    left arm  . COPD (chronic obstructive pulmonary disease) ([place])   . Hypertension   . Kidney stone   . Lung cancer ([place])   . Melanoma ([place]) 01/24/2016   right shoulder  . Thyroid nodule 02/02/2016   left BENIGN THYROID NODULE by FNA    Past Surgical History:  Procedure Laterality Date  . arm surgery Left    arm  . cardiac stents  2011   Angioplasty / Stenting Femoral-X2  . COLONOSCOPY  04/01/12  . CORONARY ANGIOPLASTY    . EXCISION MELANOMA WITH SENTINEL LYMPH NODE BIOPSY Right 01/24/2016   Procedure: EXCISION MELANOMA Right Shoulder;  Surgeon: Robert Bellow, MD;  Location: [facility] ORS;  Service: General;  Laterality: Right;  . LAPAROSCOPIC NEPHRECTOMY, HAND ASSISTED Left 04/24/2016   Procedure: HAND ASSISTED LAPAROSCOPIC NEPHRECTOMY;  Surgeon: Hollice Espy, MD;  Location: [facility] ORS;  Service: Urology;  Laterality: Left;  Marland Kitchen VIDEO ASSISTED THORACOSCOPY (VATS)/THOROCOTOMY Left 03/08/2016   Procedure: VIDEO ASSISTED THORACOSCOPY (VATS) with lung biopsy - Left ;  Surgeon: Robert Bellow, MD;  Location: [facility] ORS;  Service: General;  Laterality: Left;    Social History   Socioeconomic History  . Marital status: Married    Spouse name: Not on file  . Number of children: Not on file  . Years of education: Not on file  . Highest education level: Not on file  Occupational History  . Not on file  Social Needs  . Financial resource strain: Not on file  . Food insecurity:    Worry: Not on file    Inability: Not on file  . Transportation needs:    Medical: Not on file    Non-medical: Not on file  Tobacco Use  . Smoking status: Former Smoker    Types: Cigarettes    Start date: 01/18/1956    Last attempt to quit: 01/17/1969    Years since quitting: 49.2  . Smokeless tobacco: Never Used  Substance and Sexual Activity  . Alcohol use: No    Alcohol/week: 0.0 oz    Comment: BEER OCC  . Drug use: No  . Sexual activity: Not on file    Lifestyle  . Physical activity:    Days per week: Not on file    Minutes per session: Not on file  . Stress: Not on file  Relationships  . Social connections:    Talks on phone: Not on file    Gets together: Not on file    Attends religious service: Not on file    Active member of club or organization: Not on file    Attends meetings of clubs or organizations: Not on file    Relationship status: Not on file  . Intimate partner violence:    Fear of current or ex partner: Not on file    Emotionally abused: Not on file    Physically abused: Not on file    Forced sexual activity: Not on file  Other Topics Concern  . Not on file  Social History Narrative  . Not on file    Family History  Problem Relation Age of Onset  . Hodgkin's lymphoma Daughter 5  . Kidney cancer Neg Hx   . Kidney disease Neg Hx   . Prostate cancer Neg Hx     Current  Outpatient Medications:  .  acetaminophen (TYLENOL) 500 MG tablet, Take 500 mg by mouth every 6 (six) hours as needed for moderate pain., Disp: , Rfl:  .  albuterol (PROVENTIL HFA;VENTOLIN HFA) 108 (90 Base) MCG/ACT inhaler, Inhale 2 puffs into the lungs every 6 (six) hours as needed for wheezing or shortness of breath. (Patient not taking: Reported on 04/04/2018), Disp: 1 Inhaler, Rfl: 2 .  amLODipine (NORVASC) 5 MG tablet, Take 1 tablet (5 mg total) by mouth 2 (two) times daily., Disp: 60 tablet, Rfl: 0 .  aspirin EC 81 MG tablet, Take 81 mg by mouth daily., Disp: , Rfl:  .  atorvastatin (LIPITOR) 40 MG tablet, Take 40 mg by mouth daily., Disp: , Rfl:  .  axitinib (INLYTA) 5 MG tablet, Take 1 tablet (5 mg total) by mouth 2 (two) times daily. (Patient taking differently: Take 5 mg by mouth daily. ), Disp: 60 tablet, Rfl: 4 .  busPIRone (BUSPAR) 5 MG tablet, Take 2 tablets by mouth daily., Disp: , Rfl:  .  clopidogrel (PLAVIX) 75 MG tablet, Take 75 mg by mouth daily., Disp: , Rfl:  .  docusate sodium (COLACE) 100 MG capsule, Take 1 capsule (100 mg  total) by mouth 2 (two) times daily., Disp: 60 capsule, Rfl: 3 .  DULoxetine (CYMBALTA) 30 MG capsule, Take 2 capsules by mouth daily., Disp: , Rfl:  .  ferrous sulfate 325 (65 FE) MG EC tablet, Take 325 mg by mouth 3 (three) times daily with meals., Disp: , Rfl:  .  fluticasone (FLONASE) 50 MCG/ACT nasal spray, Place 2 sprays into both nostrils daily., Disp: 16 g, Rfl: 2 .  Fluticasone-Salmeterol (ADVAIR DISKUS) 500-50 MCG/DOSE AEPB, Inhale 1 puff into the lungs 2 (two) times daily., Disp: 60 each, Rfl: 11 .  ipratropium-albuterol (DUONEB) 0.5-2.5 (3) MG/3ML SOLN, Take 3 mLs by nebulization every 4 (four) hours as needed., Disp: 360 mL, Rfl: 3 .  loratadine (CLARITIN) 10 MG tablet, Take 10 mg by mouth daily., Disp: , Rfl:  .  losartan (COZAAR) 50 MG tablet, Take 50 mg by mouth every morning. , Disp: , Rfl:  .  MELATONIN PO, Take 10 mg by mouth at bedtime. , Disp: , Rfl:  .  metoprolol succinate (TOPROL-XL) 50 MG 24 hr tablet, Take 50 mg by mouth every morning. , Disp: , Rfl:  .  midodrine (PROAMATINE) 2.5 MG tablet, Take 1 tablet (2.5 mg total) by mouth 2 (two) times daily with a meal., Disp: 60 tablet, Rfl: 0 .  Misc Natural Products (GLUCOSAMINE CHOND COMPLEX/MSM) TABS, Take 2 tablets by mouth daily., Disp: , Rfl:  .  montelukast (SINGULAIR) 10 MG tablet, Take 1 tablet (10 mg total) by mouth at bedtime., Disp: 30 tablet, Rfl: 3 .  nitroGLYCERIN (NITROSTAT) 0.4 MG SL tablet, Place 0.4 mg under the tongue every 5 (five) minutes as needed. , Disp: , Rfl:  .  Omega-3 Fatty Acids (FISH OIL) 1000 MG CPDR, Take 1 capsule by mouth daily., Disp: , Rfl:  .  tamsulosin (FLOMAX) 0.4 MG CAPS capsule, TAKE 1 CAPSULE (0.4 MG TOTAL) BY MOUTH EVERY MORNING., Disp: 30 capsule, Rfl: 1 .  Tiotropium Bromide Monohydrate (SPIRIVA RESPIMAT) 1.25 MCG/ACT AERS, Inhale 1.25 Act into the lungs 2 (two) times daily., Disp: 1 Inhaler, Rfl: 11 .  tiZANidine (ZANAFLEX) 4 MG tablet, Take 1 tablet (4 mg total) by mouth as needed  for muscle spasms., Disp: 30 tablet, Rfl: 2  Physical exam:  Vitals:   03/24/18 1009  BP: 103/65  Pulse: 63  Resp: 20  Temp: (!) 96.7 F (35.9 C)  TempSrc: Tympanic  SpO2: 98%  Weight: 203 lb 12.8 oz (92.4 kg)   Physical Exam  Constitutional: He is oriented to person, place, and time. He appears well-developed and well-nourished. He has a sickly appearance. No distress.  HENT:  Head: Normocephalic and atraumatic.  Nose: Nose normal.  Mouth/Throat: Oropharynx is clear and moist. No oropharyngeal exudate.  Eyes: Pupils are equal, round, and reactive to light. Conjunctivae and EOM are normal. No scleral icterus.  Neck: Normal range of motion. Neck supple.  Cardiovascular: Normal rate, regular rhythm, normal heart sounds and intact distal pulses.  Pulmonary/Chest: Effort normal and breath sounds normal. No respiratory distress. He has no wheezes.  Abdominal: Soft. Bowel sounds are normal. He exhibits no distension. There is no tenderness. There is no rebound.  Musculoskeletal: He exhibits no edema or deformity.  Neurological: He is alert and oriented to person, place, and time.  Skin: Skin is warm and dry.  Psychiatric: He has a normal mood and affect. His behavior is normal.     CMP Latest Ref Rng & Units 04/04/2018  Glucose 65 - 99 mg/dL 149(H)  BUN 6 - 20 mg/dL 13  Creatinine 0.61 - 1.24 mg/dL 1.38(H)  Sodium 135 - 145 mmol/L 136  Potassium 3.5 - 5.1 mmol/L 3.8  Chloride 101 - 111 mmol/L 99(L)  CO2 22 - 32 mmol/L 28  Calcium 8.9 - 10.3 mg/dL 9.8  Total Protein 6.5 - 8.1 g/dL 7.5  Total Bilirubin 0.3 - 1.2 mg/dL 0.8  Alkaline Phos 38 - 126 U/L 121  AST 15 - 41 U/L 25  ALT 17 - 63 U/L 22   CBC Latest Ref Rng & Units 04/04/2018  WBC 3.8 - 10.6 K/uL 8.1  Hemoglobin 13.0 - 18.0 g/dL 16.2  Hematocrit 40.0 - 52.0 % 44.5  Platelets 150 - 440 K/uL 181    No images are attached to the encounter.  Dg Chest 2 View  Result Date: 03/24/2018 CLINICAL DATA:  Cough, shortness of  breath, history lung cancer, melanoma, chemotherapy, COPD, coronary artery disease EXAM: CHEST - 2 VIEW COMPARISON:  CT chest 01/24/2018 FINDINGS: Normal heart size, mediastinal contours, and pulmonary vascularity. 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 Focus of nodularity at the LEFT apex on the prior CT exam is likely still present, suboptimally visualized due to superimposed osseous structures. Decreased prominence of a RIGHT upper lobe nodular density since previous study now measuring approximately 11 mm diameter. Peribronchial thickening with RIGHT basilar atelectasis. No acute infiltrate, pleural effusion or pneumothorax. 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 IMPRESSION: Chronic bronchitic changes with RIGHT basilar atelectasis. Decreased size of RIGHT upper lobe nodule since prior exam. Probable persistence of LEFT upper lobe nodule. Electronically Signed   By: Lavonia Dana M.D.   On: 03/24/2018 10:00     Assessment and plan- Patient is a 77 y.o. male with left kidney cancer metastatic to lung s/p  Nephrectomy who presents to [facility] for complaints of low blood pressure and dizziness.  Patient  1.  Left kidney cancer metastatic to lung-intermediate risk, most recently on sunitinib.   01/2018 CT shows progression of lung lesions. Currently on axitinib 5mg  BID.    2. URI- hx of copd and chronic cough but symptoms more consistent with URI than copd exacerbation at this time. Will start azithromycin. If symptoms worsen would consider adjusting inhalers/nebs  and steroids. Continue home O2.  3. Dehydration- Patient has needed IV fluids in the past. Cr slightly improved today, bun 16 (more elevated). IV fluids given today. Continue to monitor. May need additional fluids in future.   4. Fatigue- likely related to axitinib and exacerbated by dehydration, malnutrition, and URI. Continue to monitor.    rtc on  04/30/18 for labs and re-evaluation with Dr. Rogue Bussing or sooner if symptoms persist or worsen.   Visit Diagnosis 1. Cancer of left kidney excluding renal pelvis (HCC)   2. Upper respiratory tract infection, unspecified type   3. Dehydration   4. Neoplastic malignant related fatigue     Patient advised to notify the clinic if there is no improvement in symptoms or if symptoms worsen in next 3-4 days.   Patient expressed understanding and was in agreement with this plan. He also understands that He can call clinic at any time with any questions, concerns, or complaints.    Beckey Rutter, DNP, AGNP-C [place] at [facility] [phone] (work cell) [phone] (office)

## 2018-04-08 ENCOUNTER — Telehealth: Payer: Self-pay | Admitting: *Deleted

## 2018-04-08 LAB — URINE CULTURE: Culture: NO GROWTH

## 2018-04-08 MED ORDER — AZITHROMYCIN 250 MG PO TABS: ORAL_TABLET | ORAL | 0 refills | Status: DC

## 2018-04-08 NOTE — Telephone Encounter (Signed)
Patient informed of prescription sent to pharmacy

## 2018-04-08 NOTE — Telephone Encounter (Signed)
Wife called and reports that an antibiotics was to be sent to pharmacy for patient and did not get sent. Please advise if he needs antibiotics.

## 2018-04-09 MED FILL — INLYTA 5 MG TABLET: 5 | 30 days supply | Qty: 60 | Fill #2

## 2018-04-10 ENCOUNTER — Emergency Department
Admission: EM | Admit: 2018-04-10 | Discharge: 2018-04-10 | Disposition: A | Discharge status: Home / Self Care | Payer: PPO | Attending: Emergency Medicine | Admitting: Emergency Medicine

## 2018-04-10 ENCOUNTER — Encounter: Payer: Self-pay | Admitting: Emergency Medicine

## 2018-04-10 ENCOUNTER — Telehealth: Payer: Self-pay | Admitting: *Deleted

## 2018-04-10 ENCOUNTER — Other Ambulatory Visit: Payer: Self-pay

## 2018-04-10 DIAGNOSIS — I10 Essential (primary) hypertension: Secondary | ICD-10-CM | POA: Diagnosis not present

## 2018-04-10 DIAGNOSIS — I2510 Atherosclerotic heart disease of native coronary artery without angina pectoris: Secondary | ICD-10-CM | POA: Insufficient documentation

## 2018-04-10 DIAGNOSIS — Z85528 Personal history of other malignant neoplasm of kidney: Secondary | ICD-10-CM | POA: Insufficient documentation

## 2018-04-10 DIAGNOSIS — R638 Other symptoms and signs concerning food and fluid intake: Secondary | ICD-10-CM | POA: Diagnosis not present

## 2018-04-10 DIAGNOSIS — Z87891 Personal history of nicotine dependence: Secondary | ICD-10-CM | POA: Diagnosis not present

## 2018-04-10 DIAGNOSIS — J449 Chronic obstructive pulmonary disease, unspecified: Secondary | ICD-10-CM | POA: Insufficient documentation

## 2018-04-10 DIAGNOSIS — Z85828 Personal history of other malignant neoplasm of skin: Secondary | ICD-10-CM | POA: Insufficient documentation

## 2018-04-10 DIAGNOSIS — M6281 Muscle weakness (generalized): Secondary | ICD-10-CM | POA: Diagnosis not present

## 2018-04-10 DIAGNOSIS — R630 Anorexia: Secondary | ICD-10-CM | POA: Insufficient documentation

## 2018-04-10 DIAGNOSIS — Z85118 Personal history of other malignant neoplasm of bronchus and lung: Secondary | ICD-10-CM | POA: Insufficient documentation

## 2018-04-10 DIAGNOSIS — R531 Weakness: Secondary | ICD-10-CM | POA: Diagnosis not present

## 2018-04-10 LAB — COMPREHENSIVE METABOLIC PANEL
ALBUMIN: 4.4 g/dL (ref 3.5–5.0)
ALT: 21 U/L (ref 17–63)
ANION GAP: 8 (ref 5–15)
AST: 24 U/L (ref 15–41)
Alkaline Phosphatase: 115 U/L (ref 38–126)
BUN: 19 mg/dL (ref 6–20)
CALCIUM: 9.9 mg/dL (ref 8.9–10.3)
CHLORIDE: 98 mmol/L — AB (ref 101–111)
CO2: 28 mmol/L (ref 22–32)
Creatinine, Ser: 1.28 mg/dL — ABNORMAL HIGH (ref 0.61–1.24)
GFR calc non Af Amer: 53 mL/min — ABNORMAL LOW (ref >60)
Glucose, Bld: 126 mg/dL — ABNORMAL HIGH (ref 65–99)
POTASSIUM: 4.2 mmol/L (ref 3.5–5.1)
SODIUM: 134 mmol/L — AB (ref 135–145)
Total Bilirubin: 1.1 mg/dL (ref 0.3–1.2)
Total Protein: 7.8 g/dL (ref 6.5–8.1)

## 2018-04-10 LAB — CBC WITH DIFFERENTIAL/PLATELET
BASOS PCT: 1 %
Basophils Absolute: 0 10*3/uL (ref 0–0.1)
EOS ABS: 0.1 10*3/uL (ref 0–0.7)
EOS PCT: 2 %
HCT: 49.4 % (ref 40.0–52.0)
Hemoglobin: 18 g/dL (ref 13.0–18.0)
LYMPHS ABS: 2.4 10*3/uL (ref 1.0–3.6)
Lymphocytes Relative: 29 %
MCH: 33.9 pg (ref 26.0–34.0)
MCHC: 36.5 g/dL — AB (ref 32.0–36.0)
MCV: 92.8 fL (ref 80.0–100.0)
MONOS PCT: 7 %
Monocytes Absolute: 0.6 10*3/uL (ref 0.2–1.0)
NEUTROS PCT: 61 %
Neutro Abs: 5 10*3/uL (ref 1.4–6.5)
PLATELETS: 195 10*3/uL (ref 150–440)
RBC: 5.32 MIL/uL (ref 4.40–5.90)
RDW: 13.9 % (ref 11.5–14.5)
WBC: 8.1 10*3/uL (ref 3.8–10.6)

## 2018-04-10 LAB — URINALYSIS, COMPLETE (UACMP) WITH MICROSCOPIC
BILIRUBIN URINE: NEGATIVE
Bacteria, UA: NONE SEEN
GLUCOSE, UA: NEGATIVE mg/dL
Ketones, ur: NEGATIVE mg/dL
LEUKOCYTES UA: NEGATIVE
NITRITE: NEGATIVE
PH: 5 (ref 5.0–8.0)
Protein, ur: 30 mg/dL — AB
SPECIFIC GRAVITY, URINE: 1.016 (ref 1.005–1.030)
Squamous Epithelial / LPF: NONE SEEN (ref 0–5)

## 2018-04-10 LAB — TROPONIN I: Troponin I: <0.03 ng/mL (ref <0.03)

## 2018-04-10 LAB — GLUCOSE, CAPILLARY: Glucose-Capillary: 122 mg/dL — ABNORMAL HIGH (ref 65–99)

## 2018-04-10 MED ORDER — DRONABINOL 2.5 MG PO CAPS: 2.5000 mg | ORAL_CAPSULE | Freq: Two times a day (BID) | ORAL | 1 refills | Status: DC

## 2018-04-10 MED ORDER — SODIUM CHLORIDE 0.9 % IV SOLN
Freq: Once | INTRAVENOUS | Status: AC
  Administered 2018-04-10: 14:00:00 via INTRAVENOUS

## 2018-04-10 NOTE — ED Provider Notes (Signed)
[facility] Emergency Department Provider Note       Time seen: ----------------------------------------- 2:01 PM on 04/10/2018 -----------------------------------------   I have reviewed the triage vital signs and the nursing notes.  HISTORY   Chief Complaint Dehydration    HPI Glen Davis is a 77 y.o. male with a history of urinary artery disease, lung cancer, COPD, melanoma who presents to the ED for possible dehydration.  Patient was sent by his oncologist for IV fluids.  Patient reports decreased appetite over the past few weeks, is currently taking oral daily chemotherapy for lung cancer.  He denies fevers, chills, chest pain, shortness of breath, vomiting or diarrhea.  Past Medical History:  Diagnosis Date  . CAD (coronary artery disease)   . Cancer ([place])    left arm  . COPD (chronic obstructive pulmonary disease) ([place])   . Hypertension   . Kidney stone   . Lung cancer ([place])   . Melanoma ([place]) 01/24/2016   right shoulder  . Thyroid nodule 02/02/2016   left BENIGN THYROID NODULE by FNA    Patient Active Problem List   Diagnosis Date Noted  . Orthostatic hypotension 03/24/2018  . Acute sinusitis 02/06/2017  . Cough 10/09/2016  . SOB (shortness of breath) 09/13/2016  . Syncope 08/17/2016  . Situational anxiety 06/05/2016  . Cancer of left kidney excluding renal pelvis ([place]) 2020/09/1916  . 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 Coronary artery disease involving native coronary artery of native heart without angina pectoris 03/16/2016  . Essential (primary) hypertension 03/16/2016  . Hypertriglyceridemia 03/16/2016  . Left thyroid nodule 02/03/2016  . Pulmonary nodules/lesions, multiple 02/03/2016  . Counseling regarding goals of care 01/30/2016  . Vaccine counseling 01/30/2016  . 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 Melanoma of skin ([place]) 01/12/2016  . Breathlessness on exertion 04/25/2015    Past Surgical  History:  Procedure Laterality Date  . arm surgery Left    arm  . cardiac stents  2011   Angioplasty / Stenting Femoral-X2  . COLONOSCOPY  04/01/12  . CORONARY ANGIOPLASTY    . EXCISION MELANOMA WITH SENTINEL LYMPH NODE BIOPSY Right 01/24/2016   Procedure: EXCISION MELANOMA Right Shoulder;  Surgeon: Robert Bellow, MD;  Location: [facility] ORS;  Service: General;  Laterality: Right;  . LAPAROSCOPIC NEPHRECTOMY, HAND ASSISTED Left 04/24/2016   Procedure: HAND ASSISTED LAPAROSCOPIC NEPHRECTOMY;  Surgeon: Hollice Espy, MD;  Location: [facility] ORS;  Service: Urology;  Laterality: Left;  Marland Kitchen VIDEO ASSISTED THORACOSCOPY (VATS)/THOROCOTOMY Left 03/08/2016   Procedure: VIDEO ASSISTED THORACOSCOPY (VATS) with lung biopsy - Left ;  Surgeon: Robert Bellow, MD;  Location: [facility] ORS;  Service: General;  Laterality: Left;    Allergies Lisinopril and Other  Social History Social History   Tobacco Use  . Smoking status: Former Smoker    Types: Cigarettes    Start date: 01/18/1956    Last attempt to quit: 01/17/1969    Years since quitting: 49.2  . Smokeless tobacco: Never Used  Substance Use Topics  . Alcohol use: No    Alcohol/week: 0.0 oz    Comment: BEER OCC  . Drug use: No   Review of Systems Constitutional: Negative for fever. Cardiovascular: Negative for chest pain. Respiratory: Negative for shortness of breath. Gastrointestinal: Negative for abdominal pain, vomiting and diarrhea.  Positive for decreased appetite Musculoskeletal: Negative for back pain. Skin: Negative for rash. Neurological: Negative for headaches, focal weakness or numbness.  All systems negative/normal/unremarkable except as stated  in the HPI  ____________________________________________   PHYSICAL EXAM:  VITAL SIGNS: ED Triage Vitals [04/10/18 1354]  Enc Vitals Group     BP (!) 149/102     Pulse Rate 60     Resp 16     Temp 97.7 F (36.5 C)     Temp Source Oral     SpO2 97 %     Weight 194 lb (88 kg)      Height 6\' 1"  (1.854 m)     Head Circumference      Peak Flow      Pain Score 0     Pain Loc      Pain Edu?      Excl. in [place]?    Constitutional: Alert and oriented. Well appearing and in no distress. Eyes: Conjunctivae are normal. Normal extraocular movements. ENT   Head: Normocephalic and atraumatic.   Nose: No congestion/rhinnorhea.   Mouth/Throat: Mucous membranes are moist.   Neck: No stridor. Cardiovascular: Normal rate, regular rhythm. No murmurs, rubs, or gallops. Respiratory: Normal respiratory effort without tachypnea nor retractions. Breath sounds are clear and equal bilaterally. No wheezes/rales/rhonchi. Gastrointestinal: Soft and nontender. Normal bowel sounds Musculoskeletal: Nontender with normal range of motion in extremities. No lower extremity tenderness nor edema. Neurologic:  Normal speech and language. No gross focal neurologic deficits are appreciated.  Skin:  Skin is warm, dry and intact. No rash noted. Psychiatric: Mood and affect are normal. Speech and behavior are normal.  ____________________________________________  ED COURSE:  As part of my medical decision making, I reviewed the following data within the [place] History obtained from family if available, nursing notes, old chart and ekg, as well as notes from prior ED visits. Patient presented for possible dehydration, we will assess with labs as indicated at this time.   Procedures ____________________________________________   LABS (pertinent positives/negatives)  Labs Reviewed  CBC WITH DIFFERENTIAL/PLATELET - Abnormal; Notable for the following components:      Result Value   MCHC 36.5 (*)    All other components within normal limits  COMPREHENSIVE METABOLIC PANEL - Abnormal; Notable for the following components:   Sodium 134 (*)    Chloride 98 (*)    Glucose, Bld 126 (*)    Creatinine, Ser 1.28 (*)    GFR calc non Af Amer 53 (*)    All other components  within normal limits  URINALYSIS, COMPLETE (UACMP) WITH MICROSCOPIC - Abnormal; Notable for the following components:   Color, Urine YELLOW (*)    APPearance HAZY (*)    Hgb urine dipstick SMALL (*)    Protein, ur 30 (*)    All other components within normal limits  GLUCOSE, CAPILLARY - Abnormal; Notable for the following components:   Glucose-Capillary 122 (*)    All other components within normal limits  TROPONIN I  CBG MONITORING, ED   ____________________________________________  DIFFERENTIAL DIAGNOSIS   Dehydration, electrolyte abnormality, occult infection, metastasis  FINAL ASSESSMENT AND PLAN  Poor appetite, lung cancer   Plan: The patient had presented for poor appetite. Patient's labs perhaps reflect mild dehydration but are otherwise not significantly changed from his prior.  I will place him on Marinol for his appetite.  Otherwise he is cleared for outpatient follow-up.   Laurence Aly, MD   Note: This note was generated in part or whole with voice recognition software. Voice recognition is usually quite accurate but there are transcription errors that can and very often do occur. I  apologize for any typographical errors that were not detected and corrected.     Earleen Newport, MD 04/10/18 1455

## 2018-04-10 NOTE — ED Triage Notes (Signed)
Pt presents to ED via [facility] from home. Sent by oncologist for IVF. Pt reports decreased appetite over past few weeks. On PO chemo daily. Hx lung CA. Denies fevers, chills, SOB, chest pain, and weakness.

## 2018-04-10 NOTE — Telephone Encounter (Signed)
Wife personally contacted Glen Davis, Glen Davis at 1300. Wife reports that pt is experiencing chills/rigors. Body temp checked twice- temp is 91.5. I repeated this temp reading back to her to clarify that the body temp was indeed 91.5. She stated "yes. 91.5. Glen Davis is so weak. He is unable to get out of the bed. He oxygen levels are 94% on 2 liters of oxygen. He has no body strength in his legs. Something is just not right. I don't know if he needs more fluids. He is still not eating/drinking."  I spoke with Ander Purpura, NP. Patient may be septic given chills/rigors and low body temp. NP-advised to have wife call 911 and have EMS transport to ED.  Wife gave verbal understanding of the plan of care.

## 2018-04-10 NOTE — ED Notes (Signed)

## 2018-04-17 ENCOUNTER — Telehealth: Payer: Self-pay | Admitting: *Deleted

## 2018-04-17 NOTE — Telephone Encounter (Addendum)
Patient's wife called and requested to speak to [place], Therapist, sports. Wife states that Josten personally cut down on the inlyta to 5 mg daily instead of 1 tablet twice a day. Pt is tolerating this much better than twice daily. Wife wanted Dr. Rogue Bussing to know this. She states that "If this treatment doesn't work, then Shanon Brow no longer wants to be on any chemotherapy. Patient's bp has been stable. Pt denies any episodes of dizziness. Pt is drinking more fluids. The ER doctor had order Marinol. The patient's pharmacy does not have this drug available, but it has been ordered. Wife states that she is optimistic that Marinol would arrive sometime this week to pharmacy. "hopefully this will improve Eliseo's appetite."  Wife discussed "I know [facility] doesn't have too many medication options that Dr. B can recommend." We discussed the role of including the patient in the decision making and she stated that "if Morgen doesn't want to take any other medications, I will understand. I think his body is just tired." Wife states that pt's next ct scan is on 6/7 and pt has an apt with md on 6/17 for results. I explained to her that Dr. B would be out of the country on this day but would most likely see another oncologist in Dr. Sharmaine Base absence to review the plan of care. Wife would prefer that this apt be moved to when Dr. B comes back on 6/24 if possible, unless Dr. Jacinto Reap feels that "Sakib needs to be seen sooner."  She wanted to ensure that Dr. Jacinto Reap knew that Muhamad was dose reducing the medication. No return phone call is necessary at this time, unless Dr. B wanted to change the apts.  I spent approximately 25 mins speaking to the patient's wife and providing active listening.

## 2018-04-17 NOTE — Telephone Encounter (Signed)
Spoke with Dr. Rogue Bussing. Per md order - instructed patient's wife to keep pt on inlyta 5 mg tab- once daily for now if patient is tolerating this dose. Also md would like to move the 5/17 apt to 5/24. Keep ct scan as scheduled. Our team can touch base with the patient/patient's wife once ct scan results are available next week for further discuss results.

## 2018-04-19 DIAGNOSIS — J449 Chronic obstructive pulmonary disease, unspecified: Secondary | ICD-10-CM | POA: Diagnosis not present

## 2018-04-19 DIAGNOSIS — R0602 Shortness of breath: Secondary | ICD-10-CM | POA: Diagnosis not present

## 2018-04-19 DIAGNOSIS — R05 Cough: Secondary | ICD-10-CM | POA: Diagnosis not present

## 2018-04-19 DIAGNOSIS — C642 Malignant neoplasm of left kidney, except renal pelvis: Secondary | ICD-10-CM | POA: Diagnosis not present

## 2018-04-21 ENCOUNTER — Observation Stay (HOSPITAL_BASED_OUTPATIENT_CLINIC_OR_DEPARTMENT_OTHER)
Admit: 2018-04-21 | Discharge: 2018-04-21 | Disposition: A | Discharge status: Home / Self Care | Payer: PPO | Attending: Specialist | Admitting: Specialist

## 2018-04-21 ENCOUNTER — Encounter: Payer: Self-pay | Admitting: Emergency Medicine

## 2018-04-21 ENCOUNTER — Observation Stay
Admission: EM | Admit: 2018-04-21 | Discharge: 2018-04-23 | Disposition: A | Discharge status: Home / Self Care | Payer: PPO | Attending: Internal Medicine | Admitting: Internal Medicine

## 2018-04-21 ENCOUNTER — Encounter: Payer: Self-pay | Admitting: Nurse Practitioner

## 2018-04-21 ENCOUNTER — Observation Stay: Payer: PPO

## 2018-04-21 ENCOUNTER — Inpatient Hospital Stay: Payer: PPO | Attending: Nurse Practitioner

## 2018-04-21 ENCOUNTER — Other Ambulatory Visit: Payer: Self-pay

## 2018-04-21 ENCOUNTER — Other Ambulatory Visit: Payer: Self-pay | Admitting: *Deleted

## 2018-04-21 ENCOUNTER — Inpatient Hospital Stay: Payer: PPO | Admitting: Nurse Practitioner

## 2018-04-21 ENCOUNTER — Inpatient Hospital Stay: Payer: PPO

## 2018-04-21 ENCOUNTER — Emergency Department: Payer: PPO

## 2018-04-21 VITALS — BP 81/51 | HR 61 | Resp 18

## 2018-04-21 DIAGNOSIS — R55 Syncope and collapse: Secondary | ICD-10-CM

## 2018-04-21 DIAGNOSIS — J441 Chronic obstructive pulmonary disease with (acute) exacerbation: Secondary | ICD-10-CM | POA: Insufficient documentation

## 2018-04-21 DIAGNOSIS — E079 Disorder of thyroid, unspecified: Secondary | ICD-10-CM | POA: Diagnosis not present

## 2018-04-21 DIAGNOSIS — E781 Pure hyperglyceridemia: Secondary | ICD-10-CM | POA: Insufficient documentation

## 2018-04-21 DIAGNOSIS — Z87891 Personal history of nicotine dependence: Secondary | ICD-10-CM | POA: Insufficient documentation

## 2018-04-21 DIAGNOSIS — C7800 Secondary malignant neoplasm of unspecified lung: Secondary | ICD-10-CM | POA: Diagnosis not present

## 2018-04-21 DIAGNOSIS — Z87442 Personal history of urinary calculi: Secondary | ICD-10-CM | POA: Insufficient documentation

## 2018-04-21 DIAGNOSIS — F419 Anxiety disorder, unspecified: Secondary | ICD-10-CM | POA: Insufficient documentation

## 2018-04-21 DIAGNOSIS — J449 Chronic obstructive pulmonary disease, unspecified: Secondary | ICD-10-CM | POA: Diagnosis not present

## 2018-04-21 DIAGNOSIS — I10 Essential (primary) hypertension: Secondary | ICD-10-CM | POA: Diagnosis not present

## 2018-04-21 DIAGNOSIS — R918 Other nonspecific abnormal finding of lung field: Secondary | ICD-10-CM | POA: Insufficient documentation

## 2018-04-21 DIAGNOSIS — E785 Hyperlipidemia, unspecified: Secondary | ICD-10-CM | POA: Diagnosis not present

## 2018-04-21 DIAGNOSIS — Z85820 Personal history of malignant melanoma of skin: Secondary | ICD-10-CM | POA: Diagnosis not present

## 2018-04-21 DIAGNOSIS — R6884 Jaw pain: Secondary | ICD-10-CM | POA: Diagnosis not present

## 2018-04-21 DIAGNOSIS — Y9301 Activity, walking, marching and hiking: Secondary | ICD-10-CM | POA: Diagnosis not present

## 2018-04-21 DIAGNOSIS — N400 Enlarged prostate without lower urinary tract symptoms: Secondary | ICD-10-CM | POA: Insufficient documentation

## 2018-04-21 DIAGNOSIS — W19XXXA Unspecified fall, initial encounter: Secondary | ICD-10-CM | POA: Insufficient documentation

## 2018-04-21 DIAGNOSIS — E041 Nontoxic single thyroid nodule: Secondary | ICD-10-CM | POA: Insufficient documentation

## 2018-04-21 DIAGNOSIS — Z955 Presence of coronary angioplasty implant and graft: Secondary | ICD-10-CM | POA: Diagnosis not present

## 2018-04-21 DIAGNOSIS — C642 Malignant neoplasm of left kidney, except renal pelvis: Secondary | ICD-10-CM

## 2018-04-21 DIAGNOSIS — S0993XA Unspecified injury of face, initial encounter: Secondary | ICD-10-CM | POA: Diagnosis not present

## 2018-04-21 DIAGNOSIS — Z807 Family history of other malignant neoplasms of lymphoid, hematopoietic and related tissues: Secondary | ICD-10-CM | POA: Diagnosis not present

## 2018-04-21 DIAGNOSIS — Z888 Allergy status to other drugs, medicaments and biological substances status: Secondary | ICD-10-CM | POA: Diagnosis not present

## 2018-04-21 DIAGNOSIS — I951 Orthostatic hypotension: Principal | ICD-10-CM | POA: Insufficient documentation

## 2018-04-21 DIAGNOSIS — I340 Nonrheumatic mitral (valve) insufficiency: Secondary | ICD-10-CM | POA: Diagnosis not present

## 2018-04-21 DIAGNOSIS — C649 Malignant neoplasm of unspecified kidney, except renal pelvis: Secondary | ICD-10-CM | POA: Diagnosis not present

## 2018-04-21 DIAGNOSIS — S0003XA Contusion of scalp, initial encounter: Secondary | ICD-10-CM | POA: Diagnosis not present

## 2018-04-21 DIAGNOSIS — R21 Rash and other nonspecific skin eruption: Secondary | ICD-10-CM | POA: Diagnosis not present

## 2018-04-21 DIAGNOSIS — S0990XA Unspecified injury of head, initial encounter: Secondary | ICD-10-CM | POA: Diagnosis not present

## 2018-04-21 DIAGNOSIS — I2510 Atherosclerotic heart disease of native coronary artery without angina pectoris: Secondary | ICD-10-CM | POA: Diagnosis not present

## 2018-04-21 DIAGNOSIS — I6523 Occlusion and stenosis of bilateral carotid arteries: Secondary | ICD-10-CM | POA: Insufficient documentation

## 2018-04-21 DIAGNOSIS — S0101XA Laceration without foreign body of scalp, initial encounter: Secondary | ICD-10-CM

## 2018-04-21 DIAGNOSIS — R110 Nausea: Secondary | ICD-10-CM

## 2018-04-21 DIAGNOSIS — Z9221 Personal history of antineoplastic chemotherapy: Secondary | ICD-10-CM | POA: Diagnosis not present

## 2018-04-21 DIAGNOSIS — S0512XA Contusion of eyeball and orbital tissues, left eye, initial encounter: Secondary | ICD-10-CM | POA: Insufficient documentation

## 2018-04-21 DIAGNOSIS — F329 Major depressive disorder, single episode, unspecified: Secondary | ICD-10-CM | POA: Insufficient documentation

## 2018-04-21 DIAGNOSIS — Z79899 Other long term (current) drug therapy: Secondary | ICD-10-CM | POA: Diagnosis not present

## 2018-04-21 DIAGNOSIS — Z7982 Long term (current) use of aspirin: Secondary | ICD-10-CM | POA: Insufficient documentation

## 2018-04-21 DIAGNOSIS — C44622 Squamous cell carcinoma of skin of right upper limb, including shoulder: Secondary | ICD-10-CM

## 2018-04-21 DIAGNOSIS — C439 Malignant melanoma of skin, unspecified: Secondary | ICD-10-CM

## 2018-04-21 DIAGNOSIS — I081 Rheumatic disorders of both mitral and tricuspid valves: Secondary | ICD-10-CM | POA: Insufficient documentation

## 2018-04-21 LAB — COMPREHENSIVE METABOLIC PANEL
ALBUMIN: 3.6 g/dL (ref 3.5–5.0)
ALK PHOS: 77 U/L (ref 38–126)
ALT: 13 U/L — ABNORMAL LOW (ref 17–63)
ANION GAP: 6 (ref 5–15)
AST: 23 U/L (ref 15–41)
BUN: 14 mg/dL (ref 6–20)
CO2: 25 mmol/L (ref 22–32)
Calcium: 9.2 mg/dL (ref 8.9–10.3)
Chloride: 106 mmol/L (ref 101–111)
Creatinine, Ser: 1.61 mg/dL — ABNORMAL HIGH (ref 0.61–1.24)
GFR calc Af Amer: 46 mL/min — ABNORMAL LOW (ref >60)
GFR calc non Af Amer: 40 mL/min — ABNORMAL LOW (ref >60)
Glucose, Bld: 146 mg/dL — ABNORMAL HIGH (ref 65–99)
POTASSIUM: 3.6 mmol/L (ref 3.5–5.1)
SODIUM: 137 mmol/L (ref 135–145)
Total Bilirubin: 0.5 mg/dL (ref 0.3–1.2)
Total Protein: 6.3 g/dL — ABNORMAL LOW (ref 6.5–8.1)

## 2018-04-21 LAB — MAGNESIUM: Magnesium: 1.9 mg/dL (ref 1.7–2.4)

## 2018-04-21 LAB — TROPONIN I: Troponin I: <0.03 ng/mL (ref <0.03)

## 2018-04-21 MED ORDER — SODIUM CHLORIDE 0.9 % IV SOLN
1000.0000 mL | Freq: Once | INTRAVENOUS | Status: AC
  Administered 2018-04-21: 1000 mL via INTRAVENOUS

## 2018-04-21 MED ORDER — MOMETASONE FURO-FORMOTEROL FUM 200-5 MCG/ACT IN AERO
2.0000 | INHALATION_SPRAY | Freq: Two times a day (BID) | RESPIRATORY_TRACT | Status: DC
  Administered 2018-04-21 – 2018-04-23 (×4): 2 via RESPIRATORY_TRACT
  Filled 2018-04-21: qty 8.8

## 2018-04-21 MED ORDER — DOCUSATE SODIUM 100 MG PO CAPS
100.0000 mg | ORAL_CAPSULE | Freq: Two times a day (BID) | ORAL | Status: DC
  Administered 2018-04-21 – 2018-04-23 (×4): 100 mg via ORAL
  Filled 2018-04-21 (×4): qty 1

## 2018-04-21 MED ORDER — FERROUS SULFATE 325 (65 FE) MG PO TABS
325.0000 mg | ORAL_TABLET | Freq: Three times a day (TID) | ORAL | Status: DC
  Administered 2018-04-21 – 2018-04-23 (×5): 325 mg via ORAL
  Filled 2018-04-21 (×5): qty 1

## 2018-04-21 MED ORDER — FERROUS SULFATE 325 (65 FE) MG PO TBEC: 325.0000 mg | DELAYED_RELEASE_TABLET | Freq: Three times a day (TID) | ORAL | Status: DC

## 2018-04-21 MED ORDER — ATORVASTATIN CALCIUM 20 MG PO TABS
40.0000 mg | ORAL_TABLET | Freq: Every day | ORAL | Status: DC
  Administered 2018-04-22 – 2018-04-23 (×2): 40 mg via ORAL
  Filled 2018-04-21 (×2): qty 2

## 2018-04-21 MED ORDER — MONTELUKAST SODIUM 10 MG PO TABS
10.0000 mg | ORAL_TABLET | Freq: Every day | ORAL | Status: DC
  Administered 2018-04-21 – 2018-04-22 (×2): 10 mg via ORAL
  Filled 2018-04-21 (×2): qty 1

## 2018-04-21 MED ORDER — AXITINIB 5 MG PO TABS: 5.0000 mg | ORAL_TABLET | Freq: Every day | ORAL | Status: DC

## 2018-04-21 MED ORDER — HYDROCODONE-ACETAMINOPHEN 5-325 MG PO TABS
1.0000 | ORAL_TABLET | Freq: Four times a day (QID) | ORAL | Status: DC | PRN
  Administered 2018-04-21 – 2018-04-23 (×6): 1 via ORAL
  Filled 2018-04-21 (×6): qty 1

## 2018-04-21 MED ORDER — FLUTICASONE PROPIONATE 50 MCG/ACT NA SUSP
2.0000 | Freq: Every day | NASAL | Status: DC
  Administered 2018-04-22 – 2018-04-23 (×2): 2 via NASAL
  Filled 2018-04-21: qty 16

## 2018-04-21 MED ORDER — ACETAMINOPHEN 650 MG RE SUPP: 650.0000 mg | Freq: Four times a day (QID) | RECTAL | Status: DC | PRN

## 2018-04-21 MED ORDER — BUSPIRONE HCL 10 MG PO TABS
10.0000 mg | ORAL_TABLET | Freq: Every day | ORAL | Status: DC
  Administered 2018-04-22 – 2018-04-23 (×2): 10 mg via ORAL
  Filled 2018-04-21 (×3): qty 1

## 2018-04-21 MED ORDER — ENOXAPARIN SODIUM 40 MG/0.4ML ~~LOC~~ SOLN
40.0000 mg | SUBCUTANEOUS | Status: DC
  Administered 2018-04-21 – 2018-04-22 (×2): 40 mg via SUBCUTANEOUS
  Filled 2018-04-21 (×2): qty 0.4

## 2018-04-21 MED ORDER — ACETAMINOPHEN 325 MG PO TABS
650.0000 mg | ORAL_TABLET | Freq: Four times a day (QID) | ORAL | Status: DC | PRN
  Administered 2018-04-21: 650 mg via ORAL
  Filled 2018-04-21 (×2): qty 2

## 2018-04-21 MED ORDER — SODIUM CHLORIDE 0.9 % IV SOLN: Freq: Once | INTRAVENOUS | Status: DC

## 2018-04-21 MED ORDER — ASPIRIN EC 81 MG PO TBEC
81.0000 mg | DELAYED_RELEASE_TABLET | Freq: Every day | ORAL | Status: DC
  Administered 2018-04-22 – 2018-04-23 (×2): 81 mg via ORAL
  Filled 2018-04-21 (×2): qty 1

## 2018-04-21 MED ORDER — DULOXETINE HCL 30 MG PO CPEP
60.0000 mg | ORAL_CAPSULE | Freq: Every day | ORAL | Status: DC
  Administered 2018-04-22 – 2018-04-23 (×2): 60 mg via ORAL
  Filled 2018-04-21 (×2): qty 2

## 2018-04-21 MED ORDER — ONDANSETRON HCL 4 MG/2ML IJ SOLN: 4.0000 mg | Freq: Four times a day (QID) | INTRAMUSCULAR | Status: DC | PRN

## 2018-04-21 MED ORDER — TIOTROPIUM BROMIDE MONOHYDRATE 1.25 MCG/ACT IN AERS: 1.2500 | INHALATION_SPRAY | Freq: Two times a day (BID) | RESPIRATORY_TRACT | Status: DC

## 2018-04-21 MED ORDER — MIDODRINE HCL 5 MG PO TABS
2.5000 mg | ORAL_TABLET | Freq: Three times a day (TID) | ORAL | Status: DC
  Administered 2018-04-21 – 2018-04-22 (×3): 2.5 mg via ORAL
  Filled 2018-04-21 (×3): qty 1

## 2018-04-21 MED ORDER — TIOTROPIUM BROMIDE MONOHYDRATE 18 MCG IN CAPS
18.0000 ug | ORAL_CAPSULE | Freq: Every day | RESPIRATORY_TRACT | Status: DC
  Administered 2018-04-22 – 2018-04-23 (×2): 18 ug via RESPIRATORY_TRACT
  Filled 2018-04-21: qty 5

## 2018-04-21 MED ORDER — IPRATROPIUM-ALBUTEROL 0.5-2.5 (3) MG/3ML IN SOLN: 3.0000 mL | Freq: Four times a day (QID) | RESPIRATORY_TRACT | Status: DC | PRN

## 2018-04-21 MED ORDER — ALBUTEROL SULFATE (2.5 MG/3ML) 0.083% IN NEBU: 2.5000 mg | INHALATION_SOLUTION | Freq: Four times a day (QID) | RESPIRATORY_TRACT | Status: DC | PRN

## 2018-04-21 MED ORDER — ALBUTEROL SULFATE HFA 108 (90 BASE) MCG/ACT IN AERS: 2.0000 | INHALATION_SPRAY | Freq: Four times a day (QID) | RESPIRATORY_TRACT | Status: DC | PRN

## 2018-04-21 MED ORDER — TIZANIDINE HCL 4 MG PO TABS
4.0000 mg | ORAL_TABLET | ORAL | Status: DC | PRN
  Filled 2018-04-21: qty 1

## 2018-04-21 MED ORDER — CLOPIDOGREL BISULFATE 75 MG PO TABS
75.0000 mg | ORAL_TABLET | Freq: Every day | ORAL | Status: DC
  Administered 2018-04-22 – 2018-04-23 (×2): 75 mg via ORAL
  Filled 2018-04-21 (×2): qty 1

## 2018-04-21 MED ORDER — ONDANSETRON HCL 4 MG PO TABS: 4.0000 mg | ORAL_TABLET | Freq: Four times a day (QID) | ORAL | Status: DC | PRN

## 2018-04-21 MED ORDER — LORATADINE 10 MG PO TABS
10.0000 mg | ORAL_TABLET | Freq: Every day | ORAL | Status: DC
  Administered 2018-04-22 – 2018-04-23 (×2): 10 mg via ORAL
  Filled 2018-04-21 (×2): qty 1

## 2018-04-21 NOTE — Progress Notes (Signed)
Patient arrived at the cancer center post trauma fall. Patient fell at dentist office in [place] approximately 45 mins ago. Pt was at the dentist to obtain a new set of upper dentures. Patient hit head on concrete of dentist office per wife. Wife states that the dentist office tried to get the patient to go to the ER, but pt and wife refused. She told the dentist office that she would drive him to the cancer center to be evaluated. Patient arrived with 'bright red bleeding and open laceration on the left lateral side of his crown" Bruising noted on left eye and lateral side of face. Pt also stated that he fell at home yesterday while his wife was at church. He has a laceration and skin tear on his right arm. Patient has the wound on right arm dressed in stretch netting wrap. Dried blood noted on this dressing.  Note: patient is on Plavix. Pt denies any dizziness, denies any pain, nausea, vomiting, diarrhea, shortness of breath (sats at 93% RA) . BP -  81/51. HR 61. Pt alert and oriented to time, place, name and date of birth. Pt states that he "blacked out this morning. I was standing up in the office and just blacked out." per wife, patient took all bp medications today. - He is currently taking 5 mg daily of Inlyta. Patient self adjusted this oral chemotherapy dose last week. Per Wife, pt was "having a great week and was feeling better on this new dosing. Ezel got out in the yard on Saturday and washed a vehicle."

## 2018-04-21 NOTE — ED Notes (Signed)
First Nurse Note:  Patient to ED from [place] via [facility] with Doni RN who states she started a 22 ga. In left AC, and ordered CBC, MetC and Mg this AM.  Patient did not have appt in [facility], he showed up this AM.  [place] this AM in [place] has laceration back of head.  Black eye on left.

## 2018-04-21 NOTE — H&P (Signed)
[facility] [place] NAME: Glen Davis    MR#:  [id number]  DATE OF BIRTH:  08-Aug-1941  DATE OF ADMISSION:  04/21/2018  PRIMARY CARE PHYSICIAN: Dion Body, MD   REQUESTING/REFERRING PHYSICIAN: Dr. Lavonia Drafts  CHIEF COMPLAINT:   Chief Complaint  Patient presents with  . Loss of Consciousness    HISTORY OF PRESENT ILLNESS:  Glen Davis  is a 77 y.o. male with a known history of lung cancer, coronary artery disease, hypertension, history of nephrolithiasis who presents to the hospital due to recurrent syncopal episodes.  Patient apparently has had a syncopal episode every day for the past week.  Today he had an episode at his dentist office and therefore was sent to the ER for further evaluation.  As per the patient and his wife his prescription medications have not been recently adjusted.  Patient denies any prodromal symptoms of chest pains, palpitations, nausea vomiting prior to his syncopal episode.  He has his episodes usually when he gets up and feels somewhat lightheaded and then goes to the ground.  He is usually back to his normal self within a few seconds.  Given the recurrence of his symptoms he was brought to the ER for further evaluation.  Patient underwent a CT of his head's which was negative for acute pathology.  Hospitalist services were contacted for treatment evaluation.  PAST MEDICAL HISTORY:   Past Medical History:  Diagnosis Date  . CAD (coronary artery disease)   . Cancer ([place])    left arm  . COPD (chronic obstructive pulmonary disease) ([place])   . Hypertension   . Kidney stone   . Lung cancer ([place])   . Melanoma ([place]) 01/24/2016   right shoulder  . Thyroid nodule 02/02/2016   left BENIGN THYROID NODULE by FNA    PAST SURGICAL HISTORY:   Past Surgical History:  Procedure Laterality Date  . arm surgery Left    arm  . cardiac stents  2011   Angioplasty / Stenting Femoral-X2  . COLONOSCOPY  04/01/12  .  CORONARY ANGIOPLASTY    . EXCISION MELANOMA WITH SENTINEL LYMPH NODE BIOPSY Right 01/24/2016   Procedure: EXCISION MELANOMA Right Shoulder;  Surgeon: Robert Bellow, MD;  Location: [facility] ORS;  Service: General;  Laterality: Right;  . LAPAROSCOPIC NEPHRECTOMY, HAND ASSISTED Left 04/24/2016   Procedure: HAND ASSISTED LAPAROSCOPIC NEPHRECTOMY;  Surgeon: Hollice Espy, MD;  Location: [facility] ORS;  Service: Urology;  Laterality: Left;  Marland Kitchen VIDEO ASSISTED THORACOSCOPY (VATS)/THOROCOTOMY Left 03/08/2016   Procedure: VIDEO ASSISTED THORACOSCOPY (VATS) with lung biopsy - Left ;  Surgeon: Robert Bellow, MD;  Location: [facility] ORS;  Service: General;  Laterality: Left;    SOCIAL HISTORY:   Social History   Tobacco Use  . Smoking status: Former Smoker    Packs/day: 2.00    Years: 30.00    Pack years: 60.00    Types: Cigarettes    Start date: 01/18/1956    Last attempt to quit: 01/17/1969    Years since quitting: 49.2  . Smokeless tobacco: Never Used  Substance Use Topics  . Alcohol use: No    Alcohol/week: 0.0 oz    Comment: BEER OCC    FAMILY HISTORY:   Family History  Problem Relation Age of Onset  . Hodgkin's lymphoma Daughter 5  . Heart attack Father   . Kidney cancer Neg Hx   . Kidney disease Neg Hx   . Prostate cancer Neg Hx  DRUG ALLERGIES:   Allergies  Allergen Reactions  . Lisinopril Swelling  . Other Itching    Nitroglycerin Patch.    REVIEW OF SYSTEMS:   Review of Systems  Constitutional: Negative for fever and weight loss.  HENT: Negative for congestion, nosebleeds and tinnitus.   Eyes: Negative for blurred vision, double vision and redness.  Respiratory: Negative for cough, hemoptysis and shortness of breath.   Cardiovascular: Negative for chest pain, orthopnea, leg swelling and PND.       Syncope  Gastrointestinal: Negative for abdominal pain, diarrhea, melena, nausea and vomiting.  Genitourinary: Negative for dysuria, hematuria and urgency.  Musculoskeletal:  Negative for falls and joint pain.  Neurological: Negative for dizziness, tingling, sensory change, focal weakness, seizures, weakness and headaches.  Endo/Heme/Allergies: Negative for polydipsia. Does not bruise/bleed easily.  Psychiatric/Behavioral: Negative for depression and memory loss. The patient is not nervous/anxious.     MEDICATIONS AT HOME:   Prior to Admission medications   Medication Sig Start Date End Date Taking? Authorizing Provider  acetaminophen (TYLENOL) 500 MG tablet Take 500 mg by mouth every 6 (six) hours as needed for moderate pain.    [provider]  albuterol (PROVENTIL HFA;VENTOLIN HFA) 108 (90 Base) MCG/ACT inhaler Inhale 2 puffs into the lungs every 6 (six) hours as needed for wheezing or shortness of breath. 02/17/18   Cammie Sickle, MD  amLODipine (NORVASC) 5 MG tablet Take 1 tablet (5 mg total) by mouth 2 (two) times daily. 04/18/17   Cammie Sickle, MD  aspirin EC 81 MG tablet Take 81 mg by mouth daily.    [provider]  atorvastatin (LIPITOR) 40 MG tablet Take 40 mg by mouth daily.    [provider]  axitinib (INLYTA) 5 MG tablet Take 1 tablet (5 mg total) by mouth 2 (two) times daily. Patient taking differently: Take 5 mg by mouth daily.  01/27/18   Cammie Sickle, MD  busPIRone (BUSPAR) 5 MG tablet Take 2 tablets by mouth daily. 10/08/17 10/08/18  [provider]  clopidogrel (PLAVIX) 75 MG tablet Take 75 mg by mouth daily.    [provider]  docusate sodium (COLACE) 100 MG capsule Take 1 capsule (100 mg total) by mouth 2 (two) times daily. 06/05/17   Hollice Espy, MD  dronabinol (MARINOL) 2.5 MG capsule Take 1 capsule (2.5 mg total) by mouth 2 (two) times daily before a meal. Patient not taking: Reported on 04/21/2018 04/10/18   Earleen Newport, MD  DULoxetine (CYMBALTA) 30 MG capsule Take 2 capsules by mouth daily. 08/05/17 08/05/18  [provider]  ferrous sulfate 325 (65 FE)  MG EC tablet Take 325 mg by mouth 3 (three) times daily with meals.    [provider]  fluticasone (FLONASE) 50 MCG/ACT nasal spray Place 2 sprays into both nostrils daily. 12/27/17   Jacquelin Hawking, NP  Fluticasone-Salmeterol (ADVAIR DISKUS) 500-50 MCG/DOSE AEPB Inhale 1 puff into the lungs 2 (two) times daily. 02/17/18   Cammie Sickle, MD  ipratropium-albuterol (DUONEB) 0.5-2.5 (3) MG/3ML SOLN Take 3 mLs by nebulization every 4 (four) hours as needed. 04/04/17   Cammie Sickle, MD  loratadine (CLARITIN) 10 MG tablet Take 10 mg by mouth daily.    [provider]  losartan (COZAAR) 50 MG tablet Take 50 mg by mouth every morning.  08/03/15   [provider]  MELATONIN PO Take 10 mg by mouth at bedtime.     [provider]  metoprolol  succinate (TOPROL-XL) 50 MG 24 hr tablet Take 50 mg by mouth every morning.  10/17/15   [provider]  midodrine (PROAMATINE) 2.5 MG tablet Take 1 tablet (2.5 mg total) by mouth 2 (two) times daily with a meal. 03/24/18   Verlon Au, NP  Misc Natural Products (GLUCOSAMINE CHOND COMPLEX/MSM) TABS Take 2 tablets by mouth daily.    [provider]  montelukast (SINGULAIR) 10 MG tablet Take 1 tablet (10 mg total) by mouth at bedtime. 03/13/18   Cammie Sickle, MD  nitroGLYCERIN (NITROSTAT) 0.4 MG SL tablet Place 0.4 mg under the tongue every 5 (five) minutes as needed.  04/26/15   [provider]  Omega-3 Fatty Acids (FISH OIL) 1000 MG CPDR Take 1 capsule by mouth daily.    [provider]  OXYGEN Inhale 2 L into the lungs at bedtime.    [provider]  tamsulosin (FLOMAX) 0.4 MG CAPS capsule TAKE 1 CAPSULE (0.4 MG TOTAL) BY MOUTH EVERY MORNING. 03/17/18   Cammie Sickle, MD  Tiotropium Bromide Monohydrate (SPIRIVA RESPIMAT) 1.25 MCG/ACT AERS Inhale 1.25 Act into the lungs 2 (two) times daily. 02/17/18   Cammie Sickle, MD  tiZANidine (ZANAFLEX) 4 MG tablet Take  1 tablet (4 mg total) by mouth as needed for muscle spasms. 03/13/18   Cammie Sickle, MD      VITAL SIGNS:  Blood pressure (!) 147/89, pulse (!) 47, temperature 98.6 F (37 C), temperature source Oral, resp. rate 12, height 6\' 1"  (1.854 m), weight 88 kg (194 lb), SpO2 96 %.  PHYSICAL EXAMINATION:  Physical Exam  GENERAL:  77 y.o.-year-old patient lying in the bed in no acute distress.  EYES: Pupils equal, round, reactive to light and accommodation. No scleral icterus. Extraocular muscles intact. -Positive peri-orbital swelling and edema around the left eye. HEENT: Head atraumatic, normocephalic. Oropharynx and nasopharynx clear. No oropharyngeal erythema, moist oral mucosa  NECK:  Supple, no jugular venous distention. No thyroid enlargement, no tenderness.  LUNGS: Normal breath sounds bilaterally, no wheezing, rales, rhonchi. No use of accessory muscles of respiration.  CARDIOVASCULAR: S1, S2 RRR. No murmurs, rubs, gallops, clicks.  ABDOMEN: Soft, nontender, nondistended. Bowel sounds present. No organomegaly or mass.  EXTREMITIES: No pedal edema, cyanosis, or clubbing. + 2 pedal & radial pulses b/l.   NEUROLOGIC: Cranial nerves II through XII are intact. No focal Motor or sensory deficits appreciated b/l PSYCHIATRIC: The patient is alert and oriented x 3.  SKIN: No obvious rash, lesion, or ulcer.   LABORATORY PANEL:   CBC Recent Labs  Lab 04/21/18 0904  WBC 7.1  HGB 14.2  HCT 39.3*  PLT 162   ------------------------------------------------------------------------------------------------------------------  Chemistries  Recent Labs  Lab 04/21/18 0904  NA 137  K 3.6  CL 106  CO2 25  GLUCOSE 146*  BUN 14  CREATININE 1.61*  CALCIUM 9.2  MG 1.9  AST 23  ALT 13*  ALKPHOS 77  BILITOT 0.5   ------------------------------------------------------------------------------------------------------------------  Cardiac Enzymes Recent Labs  Lab 04/21/18 0904   TROPONINI <0.03   ------------------------------------------------------------------------------------------------------------------  RADIOLOGY:  Ct Head Wo Contrast  Result Date: 04/21/2018 CLINICAL DATA:  Left maxillary pain following a fall this morning. EXAM: CT HEAD WITHOUT CONTRAST CT MAXILLOFACIAL WITHOUT CONTRAST TECHNIQUE: Multidetector CT imaging of the head and maxillofacial structures were performed using the standard protocol without intravenous contrast. Multiplanar CT image reconstructions of the maxillofacial structures were also generated. COMPARISON:  Brain MR dated 12/30/2017 and head CT dated 12/07/2017. FINDINGS:  CT HEAD FINDINGS Brain: Mildly enlarged subarachnoid spaces. Normal size and position of the ventricles. No intracranial hemorrhage, mass lesion or CT evidence of acute infarction. Vascular: No hyperdense vessel or unexpected calcification. Skull: Normal. Negative for fracture or focal lesion. Other: Left parietal hematoma and soft tissue irregularity. CT MAXILLOFACIAL FINDINGS Osseous: No fracture or mandibular dislocation. No destructive process. Upper cervical spine degenerative changes. Orbits: Negative. No traumatic or inflammatory finding. Sinuses: Bilateral frontal, ethmoid and maxillary sinus mucosal thickening. There is also medium density fluid filling the majority of the left maxillary sinus. Soft tissues: Mild left infraorbital soft tissue swelling laterally. IMPRESSION: 1. Left parietal scalp hematoma and laceration without skull fracture or intracranial hemorrhage. 2. Left infraorbital soft tissue swelling laterally without maxillofacial fracture. 3. Chronic bilateral frontal, ethmoid and maxillary sinusitis with an acute component versus blood in the left maxillary sinus. 4. Mild diffuse cerebral and cerebellar cortical atrophy. Electronically Signed   By: Claudie Revering M.D.   On: 04/21/2018 11:39   Ct Maxillofacial Wo Contrast  Result Date: 04/21/2018 CLINICAL  DATA:  Left maxillary pain following a fall this morning. EXAM: CT HEAD WITHOUT CONTRAST CT MAXILLOFACIAL WITHOUT CONTRAST TECHNIQUE: Multidetector CT imaging of the head and maxillofacial structures were performed using the standard protocol without intravenous contrast. Multiplanar CT image reconstructions of the maxillofacial structures were also generated. COMPARISON:  Brain MR dated 12/30/2017 and head CT dated 12/07/2017. FINDINGS: CT HEAD FINDINGS Brain: Mildly enlarged subarachnoid spaces. Normal size and position of the ventricles. No intracranial hemorrhage, mass lesion or CT evidence of acute infarction. Vascular: No hyperdense vessel or unexpected calcification. Skull: Normal. Negative for fracture or focal lesion. Other: Left parietal hematoma and soft tissue irregularity. CT MAXILLOFACIAL FINDINGS Osseous: No fracture or mandibular dislocation. No destructive process. Upper cervical spine degenerative changes. Orbits: Negative. No traumatic or inflammatory finding. Sinuses: Bilateral frontal, ethmoid and maxillary sinus mucosal thickening. There is also medium density fluid filling the majority of the left maxillary sinus. Soft tissues: Mild left infraorbital soft tissue swelling laterally. IMPRESSION: 1. Left parietal scalp hematoma and laceration without skull fracture or intracranial hemorrhage. 2. Left infraorbital soft tissue swelling laterally without maxillofacial fracture. 3. Chronic bilateral frontal, ethmoid and maxillary sinusitis with an acute component versus blood in the left maxillary sinus. 4. Mild diffuse cerebral and cerebellar cortical atrophy. Electronically Signed   By: Claudie Revering M.D.   On: 04/21/2018 11:39     IMPRESSION AND PLAN:   77 year old male with past medical history of hypertension, lung cancer, coronary artery disease, anxiety, hyperlipidemia, previous history of BPH, COPD who presents to the hospital due to recurrent syncope.  1.  Syncope- etiology to syncope  is presently unclear.  Patient has had recurrent syncope over the past week. - CT head is negative for acute pathology. - We will observe the patient overnight on telemetry, watch for arrhythmias.  Patient is somewhat bradycardic while here in the ER.  Will check orthostatic vital signs hold her blood pressure meds.  We will increase his midodrine and follow orthostatics.  Will check carotid duplex, echocardiogram.  2.  History of essential hypertension- patient is admitted for recurrent syncope and there is some suspicion for orthostasis.  Hold his blood pressure meds for now.  They may need to be adjusted prior to discharge. - Patient's midodrine dose has been increased.  3.  COPD-no acute exacerbation.  Continue Spiriva, duo nebs as needed.  4.  Depression-continue Cymbalta.  5.  Lung cancer- patient is followed by  Dr. Rogue Bussing, will cont. His Oral Chemo with Axitinib.      All the records are reviewed and case discussed with ED provider. Management plans discussed with the patient, family and they are in agreement.  CODE STATUS: Full code  TOTAL TIME TAKING CARE OF THIS PATIENT: 40 minutes.    Henreitta Leber M.D on 04/21/2018 at 1:26 PM  Between 7am to 6pm - Pager - [phone]  After 6pm go to [email or web address] - password [email or web address] [facility]  Office  [phone]  CC: Primary care physician; Dion Body, MD

## 2018-04-21 NOTE — Progress Notes (Signed)
*  PRELIMINARY RESULTS* Echocardiogram 2D Echocardiogram has been performed.  Glen Davis Glen Davis 04/21/2018, 8:12 PM

## 2018-04-21 NOTE — ED Notes (Signed)
Glen Davis called to take pt to the floor

## 2018-04-21 NOTE — ED Notes (Signed)
Pt returned to room from CT

## 2018-04-21 NOTE — ED Provider Notes (Signed)
[facility] Emergency Department Provider Note   ____________________________________________    I have reviewed the triage vital signs and the nursing notes.   HISTORY  Chief Complaint Loss of Consciousness     HPI Glen Davis is a 77 y.o. male who presents after head injury with syncopal episode.  Sent to the emergency department by Dr. Rogue Bussing his oncologist.  Patient is being treated for lung cancer.  He notes that over the last week he has syncopized at least once a day.  He states that he "falls out "and immediately feels better.  Denies chest pain or palpitations.  No nausea or vomiting.  No recent travel.  No calf pain or swelling.  Today he struck his head with significant bleeding.  Past Medical History:  Diagnosis Date  . CAD (coronary artery disease)   . Cancer ([place])    left arm  . COPD (chronic obstructive pulmonary disease) ([place])   . Hypertension   . Kidney stone   . Lung cancer ([place])   . Melanoma ([place]) 01/24/2016   right shoulder  . Thyroid nodule 02/02/2016   left BENIGN THYROID NODULE by FNA    Patient Active Problem List   Diagnosis Date Noted  . Orthostatic hypotension 03/24/2018  . Acute sinusitis 02/06/2017  . Cough 10/09/2016  . SOB (shortness of breath) 09/13/2016  . Syncope 08/17/2016  . Situational anxiety 06/05/2016  . 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 Coronary artery disease involving native coronary artery of native heart without angina pectoris 03/16/2016  . Essential (primary) hypertension 03/16/2016  . Hypertriglyceridemia 03/16/2016  . Left thyroid nodule 02/03/2016  . Pulmonary nodules/lesions, multiple 02/03/2016  . Counseling regarding goals of care 01/30/2016  . Vaccine counseling 01/30/2016  . 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 Melanoma of skin ([place]) 01/12/2016  . Breathlessness on exertion  04/25/2015    Past Surgical History:  Procedure Laterality Date  . arm surgery Left    arm  . cardiac stents  2011   Angioplasty / Stenting Femoral-X2  . COLONOSCOPY  04/01/12  . CORONARY ANGIOPLASTY    . EXCISION MELANOMA WITH SENTINEL LYMPH NODE BIOPSY Right 01/24/2016   Procedure: EXCISION MELANOMA Right Shoulder;  Surgeon:  Bellow, MD;  Location: [facility] ORS;  Service: General;  Laterality: Right;  . LAPAROSCOPIC NEPHRECTOMY, HAND ASSISTED Left 04/24/2016   Procedure: HAND ASSISTED LAPAROSCOPIC NEPHRECTOMY;  Surgeon: Hollice Espy, MD;  Location: [facility] ORS;  Service: Urology;  Laterality: Left;  Marland Kitchen VIDEO ASSISTED THORACOSCOPY (VATS)/THOROCOTOMY Left 03/08/2016   Procedure: VIDEO ASSISTED THORACOSCOPY (VATS) with lung biopsy - Left ;  Surgeon:  Bellow, MD;  Location: [facility] ORS;  Service: General;  Laterality: Left;    Prior to Admission medications   Medication Sig Start Date End Date Taking? Authorizing Provider  acetaminophen (TYLENOL) 500 MG tablet Take 500 mg by mouth every 6 (six) hours as needed for moderate pain.    [provider]  albuterol (PROVENTIL HFA;VENTOLIN HFA) 108 (90 Base) MCG/ACT inhaler Inhale 2 puffs into the lungs every 6 (six) hours as needed for wheezing or shortness of breath. 02/17/18   Cammie Sickle, MD  amLODipine (NORVASC) 5 MG tablet Take 1 tablet (5 mg total) by mouth 2 (two) times daily. 04/18/17   Cammie Sickle, MD  aspirin EC 81 MG tablet Take 81 mg by mouth daily.  [provider]  atorvastatin (LIPITOR) 40 MG tablet Take 40 mg by mouth daily.    [provider]  axitinib (INLYTA) 5 MG tablet Take 1 tablet (5 mg total) by mouth 2 (two) times daily. Patient taking differently: Take 5 mg by mouth daily.  01/27/18   Cammie Sickle, MD  busPIRone (BUSPAR) 5 MG tablet Take 2 tablets by mouth daily. 10/08/17 10/08/18  [provider]  clopidogrel (PLAVIX) 75 MG tablet Take 75 mg by mouth  daily.    [provider]  docusate sodium (COLACE) 100 MG capsule Take 1 capsule (100 mg total) by mouth 2 (two) times daily. 06/05/17   Hollice Espy, MD  dronabinol (MARINOL) 2.5 MG capsule Take 1 capsule (2.5 mg total) by mouth 2 (two) times daily before a meal. Patient not taking: Reported on 04/21/2018 04/10/18   Earleen Newport, MD  DULoxetine (CYMBALTA) 30 MG capsule Take 2 capsules by mouth daily. 08/05/17 08/05/18  [provider]  ferrous sulfate 325 (65 FE) MG EC tablet Take 325 mg by mouth 3 (three) times daily with meals.    [provider]  fluticasone (FLONASE) 50 MCG/ACT nasal spray Place 2 sprays into both nostrils daily. 12/27/17   Jacquelin Hawking, NP  Fluticasone-Salmeterol (ADVAIR DISKUS) 500-50 MCG/DOSE AEPB Inhale 1 puff into the lungs 2 (two) times daily. 02/17/18   Cammie Sickle, MD  ipratropium-albuterol (DUONEB) 0.5-2.5 (3) MG/3ML SOLN Take 3 mLs by nebulization every 4 (four) hours as needed. 04/04/17   Cammie Sickle, MD  loratadine (CLARITIN) 10 MG tablet Take 10 mg by mouth daily.    [provider]  losartan (COZAAR) 50 MG tablet Take 50 mg by mouth every morning.  08/03/15   [provider]  MELATONIN PO Take 10 mg by mouth at bedtime.     [provider]  metoprolol succinate (TOPROL-XL) 50 MG 24 hr tablet Take 50 mg by mouth every morning.  10/17/15   [provider]  midodrine (PROAMATINE) 2.5 MG tablet Take 1 tablet (2.5 mg total) by mouth 2 (two) times daily with a meal. 03/24/18   Verlon Au, NP  Misc Natural Products (GLUCOSAMINE CHOND COMPLEX/MSM) TABS Take 2 tablets by mouth daily.    [provider]  montelukast (SINGULAIR) 10 MG tablet Take 1 tablet (10 mg total) by mouth at bedtime. 03/13/18   Cammie Sickle, MD  nitroGLYCERIN (NITROSTAT) 0.4 MG SL tablet Place 0.4 mg under the tongue every 5 (five) minutes as needed.  04/26/15   [provider]  Omega-3  Fatty Acids (FISH OIL) 1000 MG CPDR Take 1 capsule by mouth daily.    [provider]  OXYGEN Inhale 2 L into the lungs at bedtime.    [provider]  tamsulosin (FLOMAX) 0.4 MG CAPS capsule TAKE 1 CAPSULE (0.4 MG TOTAL) BY MOUTH EVERY MORNING. 03/17/18   Cammie Sickle, MD  Tiotropium Bromide Monohydrate (SPIRIVA RESPIMAT) 1.25 MCG/ACT AERS Inhale 1.25 Act into the lungs 2 (two) times daily. 02/17/18   Cammie Sickle, MD  tiZANidine (ZANAFLEX) 4 MG tablet Take 1 tablet (4 mg total) by mouth as needed for muscle spasms. 03/13/18   Cammie Sickle, MD     Allergies Lisinopril and Other  Family History  Problem Relation Age of Onset  . Hodgkin's lymphoma Daughter 5  . Kidney cancer Neg Hx   . Kidney disease Neg Hx   . Prostate cancer Neg Hx  Social History Social History   Tobacco Use  . Smoking status: Former Smoker    Types: Cigarettes    Start date: 01/18/1956    Last attempt to quit: 01/17/1969    Years since quitting: 49.2  . Smokeless tobacco: Never Used  Substance Use Topics  . Alcohol use: No    Alcohol/week: 0.0 oz    Comment: BEER OCC  . Drug use: No    Review of Systems  Constitutional: No fever/chills Eyes: No visual changes.  ENT: No neck pain Cardiovascular: Denies chest pain. Respiratory: Denies shortness of breath. Gastrointestinal: No abdominal pain.  No nausea, no vomiting.   Genitourinary: Negative for dysuria. Musculoskeletal: Right elbow pain mild Skin: Negative for rash. Neurological: Negative for headaches or weakness   ____________________________________________   PHYSICAL EXAM:  VITAL SIGNS: ED Triage Vitals  Enc Vitals Group     BP 04/21/18 0923 (!) 103/58     Pulse Rate 04/21/18 0923 (!) 59     Resp 04/21/18 0923 18     Temp 04/21/18 0923 98.6 F (37 C)     Temp Source 04/21/18 0923 Oral     SpO2 04/21/18 0923 99 %     Weight 04/21/18 0924 88 kg (194 lb)     Height 04/21/18 0924 1.854 m (6'  1")     Head Circumference --      Peak Flow --      Pain Score 04/21/18 0924 0     Pain Loc --      Pain Edu? --      Excl. in [place]? --     Constitutional: Alert and oriented. No acute distress. Pleasant and interactive Eyes: Conjunctivae are normal.  Head: Shallow laceration with hematoma posterior central scalp Nose: No congestion/rhinnorhea. Mouth/Throat: Mucous membranes are moist.   Neck:  Painless ROM Cardiovascular: Normal rate, regular rhythm. Grossly normal heart sounds.  Good peripheral circulation. Respiratory: Normal respiratory effort.  No retractions. Lungs CTAB. Gastrointestinal: Soft and nontender. No distention.  No CVA tenderness.  Musculoskeletal: Warm and well perfused Neurologic:  Normal speech and language. No gross focal neurologic deficits are appreciated.  Skin:  Skin is warm, dry and intact. No rash noted. Psychiatric: Mood and affect are normal. Speech and behavior are normal.  ____________________________________________   LABS (all labs ordered are listed, but only abnormal results are displayed)  Labs Reviewed  TROPONIN I   ____________________________________________  EKG  ED ECG REPORT I, Lavonia Drafts, the attending physician, personally viewed and interpreted this ECG.  Date: 04/21/2018  Rate: 59 Rhythm: Sinus bradycardia  QRS Axis: normal Intervals: normal ST/T Wave abnormalities: normal Narrative Interpretation: no evidence of acute ischemia  ____________________________________________  RADIOLOGY  CT head negative for acute injury CT max face, possibly blood in left maxillary sinus although no evidence of fracture ____________________________________________   PROCEDURES  Procedure(s) performed: No  Procedures   Critical Care performed: No ____________________________________________   INITIAL IMPRESSION / ASSESSMENT AND PLAN / ED COURSE  Pertinent labs & imaging results that were available during my care of the  patient were reviewed by me and considered in my medical decision making (see chart for details).  Patient with recurrent syncope over the last week.  No evidence of renal failure.  Typically seems to occur when standing after sitting for some time.  Suspect orthostatic hypotension, will hold blood pressure medications give IV fluids.  CT scans are reassuring for no acute injury.  Laceration does not require repair, dressed by nurse  Admitted to the hospital service to Dr. Aletha Halim request    ____________________________________________   FINAL CLINICAL IMPRESSION(S) / ED DIAGNOSES  Final diagnoses:  Syncope and collapse  Scalp laceration, initial encounter  Contusion of orbit, left, initial encounter  Injury of head, initial encounter        Note:  This document was prepared using Dragon voice recognition software and may include unintentional dictation errors.    Lavonia Drafts, MD 04/21/18 [phone]

## 2018-04-21 NOTE — Progress Notes (Signed)
Patient seen by Dr. Rogue Bussing who advised referral to ER for evaluation. Dr. Rogue Bussing recommends CT Head with admission to hospital for cardiac work-up of syncope including telemetry. Spoke to EDP regarding Dr. Aletha Halim recommendation. Spoke to [employer]. [facility] RN had started IV and IV fluids. She provided transport to ER and handoff.

## 2018-04-21 NOTE — ED Triage Notes (Addendum)
States multiple syncopal episodes over past 2 weeks. States feels like he is going to go out prior to fall. Wife states he is out only momentarily. States wakes up feeling OK. Laceration back of scalp and L hand from today. Laceration R arm from yesterday.  Alert and oriented man, denies chest pain or SOB. Smile symmetrical, grips equal, leg strength equal.

## 2018-04-21 NOTE — ED Notes (Signed)
Pt ambulated to restroom without difficulty

## 2018-04-21 NOTE — ED Notes (Signed)
Pt given water to drink. OK per Dr. Corky Downs.

## 2018-04-21 NOTE — ED Notes (Signed)
Pt reports recurrent episodes of syncope over the last week. Pt states that he is able to tell when he is fixing to have a syncopal episode.   Pt reports that today he was at the eye doctor in [place], had walked up flight of stairs and was standing in office when he became lightheaded and had syncopal episode. Pt states that he remembers hitting the ground when he fell. Pt denies any loss of memory. Pt states that he takes Plavix. Pt is in NAD at this time.

## 2018-04-21 NOTE — ED Notes (Signed)
Pt transported to CT ?

## 2018-04-22 DIAGNOSIS — R55 Syncope and collapse: Secondary | ICD-10-CM | POA: Diagnosis not present

## 2018-04-22 DIAGNOSIS — I10 Essential (primary) hypertension: Secondary | ICD-10-CM | POA: Diagnosis not present

## 2018-04-22 DIAGNOSIS — I959 Hypotension, unspecified: Secondary | ICD-10-CM | POA: Diagnosis not present

## 2018-04-22 DIAGNOSIS — C649 Malignant neoplasm of unspecified kidney, except renal pelvis: Secondary | ICD-10-CM | POA: Diagnosis not present

## 2018-04-22 DIAGNOSIS — J449 Chronic obstructive pulmonary disease, unspecified: Secondary | ICD-10-CM | POA: Diagnosis not present

## 2018-04-22 LAB — CBC WITH DIFFERENTIAL/PLATELET
Basophils Absolute: 0 10*3/uL (ref 0–0.1)
Basophils Relative: 0 %
Eosinophils Absolute: 0.2 10*3/uL (ref 0–0.7)
Eosinophils Relative: 2 %
HEMATOCRIT: 39.3 % — AB (ref 40.0–52.0)
Hemoglobin: 14.2 g/dL (ref 13.0–17.0)
LYMPHS ABS: 1.2 10*3/uL (ref 1.0–3.6)
LYMPHS PCT: 16 %
MCH: 34 pg (ref 26.0–34.0)
MCHC: 36.2 g/dL — AB (ref 32.0–36.0)
MCV: 93.8 fL (ref 80.0–100.0)
MONO ABS: 0.5 10*3/uL (ref 0.2–1.0)
MONOS PCT: 7 %
NEUTROS ABS: 5.3 10*3/uL (ref 1.4–6.5)
Neutrophils Relative %: 75 %
Platelets: 162 10*3/uL (ref 150–440)
RBC: 4.19 MIL/uL — ABNORMAL LOW (ref 4.40–5.90)
RDW: 14 % (ref 11.5–14.5)
WBC: 7.1 10*3/uL (ref 3.8–10.6)

## 2018-04-22 LAB — ECHOCARDIOGRAM COMPLETE
Height: 73 in
Weight: 3104 oz

## 2018-04-22 MED ORDER — MIDODRINE HCL 5 MG PO TABS
2.5000 mg | ORAL_TABLET | Freq: Two times a day (BID) | ORAL | Status: DC
  Administered 2018-04-22: 2.5 mg via ORAL
  Filled 2018-04-22: qty 1

## 2018-04-22 NOTE — Consult Note (Signed)
[facility] Cardiology  CARDIOLOGY CONSULT NOTE  Patient ID: Glen Davis MRN: [id number] DOB/AGE: 77-May-1942 77 y.o.  Admit date: 04/21/2018 Referring Physician [facility] Primary Physician Dion Body, MD  Primary Cardiologist [place] Reason for Consultation Recurrent syncope  HPI: 77 year old male referred for evaluation of recurrent syncope.  The patient has a history of hypertension, hyperlipidemia, renal cancer, status post nephrectomy, with pulmonary metastasis, COPD, and coronary artery disease with 80% stenosis ostium D1 by cardiac catheterization 08/2010. The patient reports a 1-week history of recurrent syncope with position changes. The patient states that when he moves from sitting to standing, he feels lightheaded briefly, knows he's going to pass out, and loses consciousness briefly, hitting the ground, and returns to normal mentation immediately. He denies prodromal nausea, diaphoresis, palpitations, chest pain, or vision changes. The patient had an episode yesterday when he was walking up the stairs to the dentist office when he felt lightheaded, got to the check-in counter, knew he was going to pass out, and lost consciousness briefly. He hit the back of his head on the ground. He was brought to [facility] for further evaluation. Admission labs notable for troponin less than 0.03, creatinine 1.61. Carotid doppler revealed mild ICA stenosis bilaterally. ECG revealed sinus bradycardia at a rate of 59 bpm without evidence of acute ischemia. Heart rate was also 47 bpm while in ER. His blood pressure medications, which included amlodipine, losartan, and metoprolol succinate, were held and his midodrine was increased. Upon review of past office notes, the patient experienced postural dizziness and hypotension on amlodipine, which had been discontinued, and he was started on midodrine. His blood pressure had actually been running quite high after starting a new chemotherapy medication. Currently, the  patient reports feeling well, and with the exception of his syncopal episodes, feels normal. He tries to remain active.       Review of systems complete and found to be negative unless listed above     Past Medical History:  Diagnosis Date  . CAD (coronary artery disease)   . Cancer ([place])    left arm  . COPD (chronic obstructive pulmonary disease) ([place])   . Hypertension   . Kidney stone   . Lung cancer ([place])   . Melanoma ([place]) 01/24/2016   right shoulder  . Thyroid nodule 02/02/2016   left BENIGN THYROID NODULE by FNA    Past Surgical History:  Procedure Laterality Date  . arm surgery Left    arm  . cardiac stents  2011   Angioplasty / Stenting Femoral-X2  . COLONOSCOPY  04/01/12  . CORONARY ANGIOPLASTY    . EXCISION MELANOMA WITH SENTINEL LYMPH NODE BIOPSY Right 01/24/2016   Procedure: EXCISION MELANOMA Right Shoulder;  Surgeon: Robert Bellow, MD;  Location: [facility] ORS;  Service: General;  Laterality: Right;  . LAPAROSCOPIC NEPHRECTOMY, HAND ASSISTED Left 04/24/2016   Procedure: HAND ASSISTED LAPAROSCOPIC NEPHRECTOMY;  Surgeon: Hollice Espy, MD;  Location: [facility] ORS;  Service: Urology;  Laterality: Left;  Marland Kitchen VIDEO ASSISTED THORACOSCOPY (VATS)/THOROCOTOMY Left 03/08/2016   Procedure: VIDEO ASSISTED THORACOSCOPY (VATS) with lung biopsy - Left ;  Surgeon: Robert Bellow, MD;  Location: [facility] ORS;  Service: General;  Laterality: Left;    Medications Prior to Admission  Medication Sig Dispense Refill Last Dose  . acetaminophen (TYLENOL) 500 MG tablet Take 500 mg by mouth every 6 (six) hours as needed for moderate pain.   PRN at PRN  . albuterol (PROVENTIL HFA;VENTOLIN HFA) 108 (90 Base) MCG/ACT inhaler Inhale  2 puffs into the lungs every 6 (six) hours as needed for wheezing or shortness of breath. 1 Inhaler 2 PRN at PRN  . amLODipine (NORVASC) 5 MG tablet Take 1 tablet (5 mg total) by mouth 2 (two) times daily. 60 tablet 0 04/21/2018 at 0800  . aspirin EC 81 MG tablet Take 81 mg by  mouth daily.   04/21/2018 at 0800  . atorvastatin (LIPITOR) 40 MG tablet Take 40 mg by mouth daily.   04/21/2018 at 0800  . axitinib (INLYTA) 5 MG tablet Take 1 tablet (5 mg total) by mouth 2 (two) times daily. (Patient taking differently: Take 5 mg by mouth daily. ) 60 tablet 4 04/21/2018 at 0800  . busPIRone (BUSPAR) 5 MG tablet Take 2 tablets by mouth daily.   04/21/2018 at 0800  . clopidogrel (PLAVIX) 75 MG tablet Take 75 mg by mouth daily.   04/21/2018 at 0800  . docusate sodium (COLACE) 100 MG capsule Take 1 capsule (100 mg total) by mouth 2 (two) times daily. 60 capsule 3 Taking  . DULoxetine (CYMBALTA) 30 MG capsule Take 2 capsules by mouth daily.   04/21/2018 at 0800  . ferrous sulfate 325 (65 FE) MG EC tablet Take 325 mg by mouth 3 (three) times daily with meals.   04/21/2018 at 0800  . fluticasone (FLONASE) 50 MCG/ACT nasal spray Place 2 sprays into both nostrils daily. 16 g 2 Taking  . Fluticasone-Salmeterol (ADVAIR DISKUS) 500-50 MCG/DOSE AEPB Inhale 1 puff into the lungs 2 (two) times daily. 60 each 11 04/21/2018 at 0800  . ipratropium-albuterol (DUONEB) 0.5-2.5 (3) MG/3ML SOLN Take 3 mLs by nebulization every 4 (four) hours as needed. 360 mL 3 PRN at PRN  . loratadine (CLARITIN) 10 MG tablet Take 10 mg by mouth daily.   04/21/2018 at 0800  . losartan (COZAAR) 50 MG tablet Take 50 mg by mouth every morning.    04/21/2018 at 0800  . MELATONIN PO Take 10 mg by mouth at bedtime.    04/20/2018 at 2100  . metoprolol succinate (TOPROL-XL) 50 MG 24 hr tablet Take 50 mg by mouth every morning.    04/21/2018 at 0800  . midodrine (PROAMATINE) 2.5 MG tablet Take 1 tablet (2.5 mg total) by mouth 2 (two) times daily with a meal. 60 tablet 0 04/21/2018 at 0800  . Misc Natural Products (GLUCOSAMINE CHOND COMPLEX/MSM) TABS Take 2 tablets by mouth daily.   Taking  . montelukast (SINGULAIR) 10 MG tablet Take 1 tablet (10 mg total) by mouth at bedtime. 30 tablet 3 04/20/2018 at 2100  . nitroGLYCERIN (NITROSTAT) 0.4 MG SL tablet  Place 0.4 mg under the tongue every 5 (five) minutes as needed.    PRN at PRN  . OXYGEN Inhale 2 L into the lungs at bedtime.   Taking  . tamsulosin (FLOMAX) 0.4 MG CAPS capsule TAKE 1 CAPSULE (0.4 MG TOTAL) BY MOUTH EVERY MORNING. 30 capsule 1 Taking  . Tiotropium Bromide Monohydrate (SPIRIVA RESPIMAT) 1.25 MCG/ACT AERS Inhale 1.25 Act into the lungs 2 (two) times daily. 1 Inhaler 11 04/21/2018 at 0800  . tiZANidine (ZANAFLEX) 4 MG tablet Take 1 tablet (4 mg total) by mouth as needed for muscle spasms. 30 tablet 2 PRN at PRN  . dronabinol (MARINOL) 2.5 MG capsule Take 1 capsule (2.5 mg total) by mouth 2 (two) times daily before a meal. (Patient not taking: Reported on 04/21/2018) 60 capsule 1 Not Taking at Unknown time  . Omega-3 Fatty Acids (FISH OIL) 1000 MG CPDR  Take 1 capsule by mouth daily.   Taking   Social History   Socioeconomic History  . Marital status: Married    Spouse name: Not on file  . Number of children: Not on file  . Years of education: Not on file  . Highest education level: Not on file  Occupational History  . Not on file  Social Needs  . Financial resource strain: Not on file  . Food insecurity:    Worry: Not on file    Inability: Not on file  . Transportation needs:    Medical: Not on file    Non-medical: Not on file  Tobacco Use  . Smoking status: Former Smoker    Packs/day: 2.00    Years: 30.00    Pack years: 60.00    Types: Cigarettes    Start date: 01/18/1956    Last attempt to quit: 01/17/1969    Years since quitting: 49.2  . Smokeless tobacco: Never Used  Substance and Sexual Activity  . Alcohol use: No    Alcohol/week: 0.0 oz    Comment: BEER OCC  . Drug use: No  . Sexual activity: Yes  Lifestyle  . Physical activity:    Days per week: Not on file    Minutes per session: Not on file  . Stress: Not on file  Relationships  . Social connections:    Talks on phone: Not on file    Gets together: Not on file    Attends religious service: Not on file     Active member of club or organization: Not on file    Attends meetings of clubs or organizations: Not on file    Relationship status: Not on file  . Intimate partner violence:    Fear of current or ex partner: Not on file    Emotionally abused: Not on file    Physically abused: Not on file    Forced sexual activity: Not on file  Other Topics Concern  . Not on file  Social History Narrative  . Not on file    Family History  Problem Relation Age of Onset  . Hodgkin's lymphoma Daughter 5  . Heart attack Father   . Kidney cancer Neg Hx   . Kidney disease Neg Hx   . Prostate cancer Neg Hx       Review of systems complete and found to be negative unless listed above      PHYSICAL EXAM  General: Well developed, well nourished, in no acute distress. Sitting on side of bed. HEENT:  Normocephalic. Scalp laceration. Contusion of left periorbital area Neck:  No JVD.  Lungs: Clear bilaterally to auscultation, normal effort of breathing Heart: HRRR . Normal S1 and S2 without gallops or murmurs.  Abdomen: Bowel sounds are positive, abdomen nondistended  Msk:  Back normal. Normal tone for age. Extremities: No clubbing, cyanosis or edema.   Neuro: Alert and oriented X 3. Psych:  Good affect, responds appropriately  Labs:   Lab Results  Component Value Date   WBC 7.1 04/21/2018   HGB 14.2 04/21/2018   HCT 39.3 (L) 04/21/2018   MCV 93.8 04/21/2018   PLT 162 04/21/2018    Recent Labs  Lab 04/21/18 0904  NA 137  K 3.6  CL 106  CO2 25  BUN 14  CREATININE 1.61*  CALCIUM 9.2  PROT 6.3*  BILITOT 0.5  ALKPHOS 77  ALT 13*  AST 23  GLUCOSE 146*   Lab Results  Component Value Date  TROPONINI <0.03 04/21/2018   No results found for: CHOL No results found for: HDL No results found for: LDLCALC No results found for: TRIG No results found for: CHOLHDL No results found for: LDLDIRECT    Radiology: Dg Chest 2 View  Result Date: 03/24/2018 CLINICAL DATA:  Cough,  shortness of breath, history lung cancer, melanoma, chemotherapy, COPD, coronary artery disease EXAM: CHEST - 2 VIEW COMPARISON:  CT chest 01/24/2018 FINDINGS: Normal heart size, mediastinal contours, and pulmonary vascularity. 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 Focus of nodularity at the LEFT apex on the prior CT exam is likely still present, suboptimally visualized due to superimposed osseous structures. Decreased prominence of a RIGHT upper lobe nodular density since previous study now measuring approximately 11 mm diameter. Peribronchial thickening with RIGHT basilar atelectasis. No acute infiltrate, pleural effusion or pneumothorax. 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 IMPRESSION: Chronic bronchitic changes with RIGHT basilar atelectasis. Decreased size of RIGHT upper lobe nodule since prior exam. Probable persistence of LEFT upper lobe nodule. Electronically Signed   By: Lavonia Dana M.D.   On: 03/24/2018 10:00   Ct Head Wo Contrast  Result Date: 04/21/2018 CLINICAL DATA:  Left maxillary pain following a fall this morning. EXAM: CT HEAD WITHOUT CONTRAST CT MAXILLOFACIAL WITHOUT CONTRAST TECHNIQUE: Multidetector CT imaging of the head and maxillofacial structures were performed using the standard protocol without intravenous contrast. Multiplanar CT image reconstructions of the maxillofacial structures were also generated. COMPARISON:  Brain MR dated 12/30/2017 and head CT dated 12/07/2017. FINDINGS: CT HEAD FINDINGS Brain: Mildly enlarged subarachnoid spaces. Normal size and position of the ventricles. No intracranial hemorrhage, mass lesion or CT evidence of acute infarction. Vascular: No hyperdense vessel or unexpected calcification. Skull: Normal. Negative for fracture or focal lesion. Other: Left parietal hematoma and soft tissue irregularity. CT MAXILLOFACIAL FINDINGS Osseous: No fracture or  mandibular dislocation. No destructive process. Upper cervical spine degenerative changes. Orbits: Negative. No traumatic or inflammatory finding. Sinuses: Bilateral frontal, ethmoid and maxillary sinus mucosal thickening. There is also medium density fluid filling the majority of the left maxillary sinus. Soft tissues: Mild left infraorbital soft tissue swelling laterally. IMPRESSION: 1. Left parietal scalp hematoma and laceration without skull fracture or intracranial hemorrhage. 2. Left infraorbital soft tissue swelling laterally without maxillofacial fracture. 3. Chronic bilateral frontal, ethmoid and maxillary sinusitis with an acute component versus blood in the left maxillary sinus. 4. Mild diffuse cerebral and cerebellar cortical atrophy. Electronically Signed   By: Claudie Revering M.D.   On: 04/21/2018 11:39   US Carotid Bilateral  Result Date: 04/21/2018 CLINICAL DATA:  77 year old male with syncope EXAM: BILATERAL CAROTID DUPLEX ULTRASOUND TECHNIQUE: Pearline Cables scale imaging, color Doppler and duplex ultrasound were performed of bilateral carotid and vertebral arteries in the neck. COMPARISON:  CT scan of the head 04/21/2017 FINDINGS: Criteria: Quantification of carotid stenosis is based on velocity parameters that correlate the residual internal carotid diameter with NASCET-based stenosis levels, using the diameter of the distal internal carotid lumen as the denominator for stenosis measurement. The following velocity measurements were obtained: RIGHT ICA: 121/34 cm/sec CCA: 35/3 cm/sec SYSTOLIC ICA/CCA RATIO:  2.6 ECA:  79 cm/sec LEFT ICA: 73/24 cm/sec CCA: 61/44 cm/sec SYSTOLIC ICA/CCA RATIO:  1.8 ECA:  66 cm/sec RIGHT CAROTID ARTERY: Trace smooth heterogeneous atherosclerotic plaque in the proximal internal carotid artery. By peak systolic velocity criteria, the estimated stenosis remains less than 50%. RIGHT VERTEBRAL ARTERY:  Patent with normal antegrade  flow. LEFT CAROTID ARTERY: Trace heterogeneous  atherosclerotic plaque in the proximal internal carotid artery. By peak systolic velocity criteria, the estimated stenosis remains less than 50%. LEFT VERTEBRAL ARTERY:  Patent with normal antegrade flow. IMPRESSION: 1. Mild (1-49%) stenosis proximal right internal carotid artery secondary to trace smooth heterogeneous atherosclerotic plaque. 2. Mild (1-49%) stenosis proximal left internal carotid artery secondary to trace smooth heterogeneous atherosclerotic plaque. 3. Vertebral arteries are patent with normal antegrade flow. Signed, Criselda Peaches, MD [facility] Radiology Electronically Signed   By: Jacqulynn Cadet M.D.   On: 04/21/2018 15:55   Ct Maxillofacial Wo Contrast  Result Date: 04/21/2018 CLINICAL DATA:  Left maxillary pain following a fall this morning. EXAM: CT HEAD WITHOUT CONTRAST CT MAXILLOFACIAL WITHOUT CONTRAST TECHNIQUE: Multidetector CT imaging of the head and maxillofacial structures were performed using the standard protocol without intravenous contrast. Multiplanar CT image reconstructions of the maxillofacial structures were also generated. COMPARISON:  Brain MR dated 12/30/2017 and head CT dated 12/07/2017. FINDINGS: CT HEAD FINDINGS Brain: Mildly enlarged subarachnoid spaces. Normal size and position of the ventricles. No intracranial hemorrhage, mass lesion or CT evidence of acute infarction. Vascular: No hyperdense vessel or unexpected calcification. Skull: Normal. Negative for fracture or focal lesion. Other: Left parietal hematoma and soft tissue irregularity. CT MAXILLOFACIAL FINDINGS Osseous: No fracture or mandibular dislocation. No destructive process. Upper cervical spine degenerative changes. Orbits: Negative. No traumatic or inflammatory finding. Sinuses: Bilateral frontal, ethmoid and maxillary sinus mucosal thickening. There is also medium density fluid filling the majority of the left maxillary sinus. Soft tissues:  Mild left infraorbital soft tissue swelling laterally. IMPRESSION: 1. Left parietal scalp hematoma and laceration without skull fracture or intracranial hemorrhage. 2. Left infraorbital soft tissue swelling laterally without maxillofacial fracture. 3. Chronic bilateral frontal, ethmoid and maxillary sinusitis with an acute component versus blood in the left maxillary sinus. 4. Mild diffuse cerebral and cerebellar cortical atrophy. Electronically Signed   By: Claudie Revering M.D.   On: 04/21/2018 11:39    EKG: Sinus rhythm   ASSESSMENT AND PLAN:  1. Recurrent syncope, likely postural hypotension secondary to blood pressure medications. 2. Bradycardia, holding metoprolol  3. Coronary artery disease with 80% stenosis ostium D1 by cardiac catheterization 08/2010, without chest pain  Plan: 1. Agree with overall therapy. 2. Continue to hold BP medications for now; will likely resume one at a time slowly  3. Review 2D echocardiogram 4. Continue to monitor on telemetry for arrhythmia.   Signed:  Clabe Seal PA-C 04/22/2018, 8:48 AM

## 2018-04-22 NOTE — Progress Notes (Signed)
Patient ambulated twice around nurses station with standby assistance. Tolerated well. No c/o feeling lightheaded or dizzy.

## 2018-04-22 NOTE — Progress Notes (Signed)
Advanced care plan.  Purpose of the Encounter: CODE STATUS  Parties in Attendance: Patient is a 77 year old with history of renal cell cancer, coronary artery disease, hypertension, hyperlipidemia presents to the hospital with recurrent syncope  Patient's Decision Capacity: Intact  Subjective/Patient's story: Patient is a 77 year old with history of renal cell cancer, coronary artery disease, hypertension, hyperlipidemia presents to the hospital with recurrent syncope    Objective/Medical story I discussed with the patient regarding CODE STATUS.  I explained him CPR intubation. Patient states that he is already a DNR and he has a yellow form on his fridge.      Goals of care determination:  DNR confirmed   CODE STATUS: DNR   Time spent discussing advanced care planning: 16 minutes

## 2018-04-22 NOTE — Care Management Obs Status (Signed)
MEDICARE OBSERVATION STATUS NOTIFICATION   Patient Details  Name: Glen Davis MRN: [id number] Date of Birth: [birth year]   Medicare Observation Status Notification Given:  Yes    Katrina Stack, RN 04/22/2018, 12:03 PM

## 2018-04-22 NOTE — Progress Notes (Signed)
[place] at [facility]                                                                                                                                                                                  Patient Demographics   G[place], is a 77 y.o. male, DOB - 1941-11-18, VQQ:[id number]  Admit date - 04/21/2018   Admitting Physician Henreitta Leber, MD  Outpatient Primary MD for the patient is Dion Body, MD   LOS - 0  Subjective: Pt admitted with recurrent syncope, telemetry without any arrythmeia    Review of Systems:   CONSTITUTIONAL: No documented fever. No fatigue, weakness. No weight gain, no weight loss.  EYES: No blurry or double vision.  ENT: No tinnitus. No postnasal drip. No redness of the oropharynx.  RESPIRATORY: No cough, no wheeze, no hemoptysis. No dyspnea.  CARDIOVASCULAR: No chest pain. No orthopnea. No palpitations. No syncope.  GASTROINTESTINAL: No nausea, no vomiting or diarrhea. No abdominal pain. No melena or hematochezia.  GENITOURINARY: No dysuria or hematuria.  ENDOCRINE: No polyuria or nocturia. No heat or cold intolerance.  HEMATOLOGY: No anemia. No bruising. No bleeding.  INTEGUMENTARY: No rashes. No lesions.  MUSCULOSKELETAL: No arthritis. No swelling. No gout.  NEUROLOGIC: No numbness, tingling, or ataxia. No seizure-type activity.  PSYCHIATRIC: No anxiety. No insomnia. No ADD.    Vitals:   Vitals:   04/21/18 1954 04/21/18 1957 04/22/18 0411 04/22/18 0729  BP: 127/75 (!) 156/87 (!) 166/96 (!) 163/95  Pulse: 62 61 (!) 55 (!) 57  Resp:   18 16  Temp:   98.3 F (36.8 C) 97.7 F (36.5 C)  TempSrc:   Oral Oral  SpO2:   96% 100%  Weight:      Height:        Wt Readings from Last 3 Encounters:  04/21/18 88 kg (194 lb)  04/10/18 88 kg (194 lb)  04/07/18 88.9 kg (196 lb)     Intake/Output Summary (Last 24 hours) at 04/22/2018 1505 Last data filed at 04/22/2018 1349 Gross per 24 hour  Intake 720 ml  Output  0 ml  Net 720 ml    Physical Exam:   GENERAL: Pleasant-appearing in no apparent distress.  HEAD, EYES, EARS, NOSE AND THROAT: Atraumatic, normocephalic. Extraocular muscles are intact. Pupils equal and reactive to light. Sclerae anicteric. No conjunctival injection. No oro-pharyngeal erythema.  NECK: Supple. There is no jugular venous distention. No bruits, no lymphadenopathy, no thyromegaly.  HEART: Regular rate and rhythm,. No murmurs, no rubs, no clicks.  LUNGS: Clear to auscultation bilaterally. No rales or rhonchi. No wheezes.  ABDOMEN: Soft, flat, nontender, nondistended. Has good bowel sounds.  No hepatosplenomegaly appreciated.  EXTREMITIES: No evidence of any cyanosis, clubbing, or peripheral edema.  +2 pedal and radial pulses bilaterally.  NEUROLOGIC: The patient is alert, awake, and oriented x3 with no focal motor or sensory deficits appreciated bilaterally.  SKIN: Moist and warm with no rashes appreciated.  Psych: Not anxious, depressed LN: No inguinal LN enlargement    Antibiotics   Anti-infectives (From admission, onward)   None      Medications   Scheduled Meds: . aspirin EC  81 mg Oral Daily  . atorvastatin  40 mg Oral Daily  . busPIRone  10 mg Oral Daily  . clopidogrel  75 mg Oral Daily  . docusate sodium  100 mg Oral BID  . DULoxetine  60 mg Oral Daily  . enoxaparin (LOVENOX) injection  40 mg Subcutaneous Q24H  . ferrous sulfate  325 mg Oral TID WC  . fluticasone  2 spray Each Nare Daily  . loratadine  10 mg Oral Daily  . midodrine  2.5 mg Oral BID WC  . mometasone-formoterol  2 puff Inhalation BID  . montelukast  10 mg Oral QHS  . tiotropium  18 mcg Inhalation Daily   Continuous Infusions: PRN Meds:.acetaminophen **OR** acetaminophen, albuterol, HYDROcodone-acetaminophen, ipratropium-albuterol, ondansetron **OR** ondansetron (ZOFRAN) IV, tiZANidine   Data Review:   Micro Results No results found for this or any previous visit (from the past 240  hour(s)).  Radiology Reports Dg Chest 2 View  Result Date: 03/24/2018 CLINICAL DATA:  Cough, shortness of breath, history lung cancer, melanoma, chemotherapy, COPD, coronary artery disease EXAM: CHEST - 2 VIEW COMPARISON:  CT chest 01/24/2018 FINDINGS: Normal heart size, mediastinal contours, and pulmonary vascularity. 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 Focus of nodularity at the LEFT apex on the prior CT exam is likely still present, suboptimally visualized due to superimposed osseous structures. Decreased prominence of a RIGHT upper lobe nodular density since previous study now measuring approximately 11 mm diameter. Peribronchial thickening with RIGHT basilar atelectasis. No acute infiltrate, pleural effusion or pneumothorax. 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 IMPRESSION: Chronic bronchitic changes with RIGHT basilar atelectasis. Decreased size of RIGHT upper lobe nodule since prior exam. Probable persistence of LEFT upper lobe nodule. Electronically Signed   By: Lavonia Dana M.D.   On: 03/24/2018 10:00   Ct Head Wo Contrast  Result Date: 04/21/2018 CLINICAL DATA:  Left maxillary pain following a fall this morning. EXAM: CT HEAD WITHOUT CONTRAST CT MAXILLOFACIAL WITHOUT CONTRAST TECHNIQUE: Multidetector CT imaging of the head and maxillofacial structures were performed using the standard protocol without intravenous contrast. Multiplanar CT image reconstructions of the maxillofacial structures were also generated. COMPARISON:  Brain MR dated 12/30/2017 and head CT dated 12/07/2017. FINDINGS: CT HEAD FINDINGS Brain: Mildly enlarged subarachnoid spaces. Normal size and position of the ventricles. No intracranial hemorrhage, mass lesion or CT evidence of acute infarction. Vascular: No hyperdense vessel or unexpected calcification. Skull: Normal. Negative for fracture or focal lesion. Other: Left parietal  hematoma and soft tissue irregularity. CT MAXILLOFACIAL FINDINGS Osseous: No fracture or mandibular dislocation. No destructive process. Upper cervical spine degenerative changes. Orbits: Negative. No traumatic or inflammatory finding. Sinuses: Bilateral frontal, ethmoid and maxillary sinus mucosal thickening. There is also medium density fluid filling the majority of the left maxillary sinus. Soft tissues: Mild left infraorbital soft tissue swelling laterally. IMPRESSION: 1. Left parietal scalp hematoma and laceration without skull fracture or intracranial hemorrhage. 2. Left infraorbital soft tissue  swelling laterally without maxillofacial fracture. 3. Chronic bilateral frontal, ethmoid and maxillary sinusitis with an acute component versus blood in the left maxillary sinus. 4. Mild diffuse cerebral and cerebellar cortical atrophy. Electronically Signed   By: Claudie Revering M.D.   On: 04/21/2018 11:39   US Carotid Bilateral  Result Date: 04/21/2018 CLINICAL DATA:  77 year old male with syncope EXAM: BILATERAL CAROTID DUPLEX ULTRASOUND TECHNIQUE: Pearline Cables scale imaging, color Doppler and duplex ultrasound were performed of bilateral carotid and vertebral arteries in the neck. COMPARISON:  CT scan of the head 04/21/2017 FINDINGS: Criteria: Quantification of carotid stenosis is based on velocity parameters that correlate the residual internal carotid diameter with NASCET-based stenosis levels, using the diameter of the distal internal carotid lumen as the denominator for stenosis measurement. The following velocity measurements were obtained: RIGHT ICA: 121/34 cm/sec CCA: 34/1 cm/sec SYSTOLIC ICA/CCA RATIO:  2.6 ECA:  79 cm/sec LEFT ICA: 73/24 cm/sec CCA: 93/79 cm/sec SYSTOLIC ICA/CCA RATIO:  1.8 ECA:  66 cm/sec RIGHT CAROTID ARTERY: Trace smooth heterogeneous atherosclerotic plaque in the proximal internal carotid artery. By peak systolic velocity criteria, the estimated stenosis remains less than 50%. RIGHT VERTEBRAL  ARTERY:  Patent with normal antegrade flow. LEFT CAROTID ARTERY: Trace heterogeneous atherosclerotic plaque in the proximal internal carotid artery. By peak systolic velocity criteria, the estimated stenosis remains less than 50%. LEFT VERTEBRAL ARTERY:  Patent with normal antegrade flow. IMPRESSION: 1. Mild (1-49%) stenosis proximal right internal carotid artery secondary to trace smooth heterogeneous atherosclerotic plaque. 2. Mild (1-49%) stenosis proximal left internal carotid artery secondary to trace smooth heterogeneous atherosclerotic plaque. 3. Vertebral arteries are patent with normal antegrade flow. Signed, Criselda Peaches, MD [facility] Electronically Signed   By: Jacqulynn Cadet M.D.   On: 04/21/2018 15:55   Ct Maxillofacial Wo Contrast  Result Date: 04/21/2018 CLINICAL DATA:  Left maxillary pain following a fall this morning. EXAM: CT HEAD WITHOUT CONTRAST CT MAXILLOFACIAL WITHOUT CONTRAST TECHNIQUE: Multidetector CT imaging of the head and maxillofacial structures were performed using the standard protocol without intravenous contrast. Multiplanar CT image reconstructions of the maxillofacial structures were also generated. COMPARISON:  Brain MR dated 12/30/2017 and head CT dated 12/07/2017. FINDINGS: CT HEAD FINDINGS Brain: Mildly enlarged subarachnoid spaces. Normal size and position of the ventricles. No intracranial hemorrhage, mass lesion or CT evidence of acute infarction. Vascular: No hyperdense vessel or unexpected calcification. Skull: Normal. Negative for fracture or focal lesion. Other: Left parietal hematoma and soft tissue irregularity. CT MAXILLOFACIAL FINDINGS Osseous: No fracture or mandibular dislocation. No destructive process. Upper cervical spine degenerative changes. Orbits: Negative. No traumatic or inflammatory finding. Sinuses: Bilateral frontal, ethmoid and maxillary sinus mucosal thickening. There is also  medium density fluid filling the majority of the left maxillary sinus. Soft tissues: Mild left infraorbital soft tissue swelling laterally. IMPRESSION: 1. Left parietal scalp hematoma and laceration without skull fracture or intracranial hemorrhage. 2. Left infraorbital soft tissue swelling laterally without maxillofacial fracture. 3. Chronic bilateral frontal, ethmoid and maxillary sinusitis with an acute component versus blood in the left maxillary sinus. 4. Mild diffuse cerebral and cerebellar cortical atrophy. Electronically Signed   By: Claudie Revering M.D.   On: 04/21/2018 11:39     CBC Recent Labs  Lab 04/21/18 0904  WBC 7.1  HGB 14.2  HCT 39.3*  PLT 162  MCV 93.8  MCH 34.0  MCHC 36.2*  RDW 14.0  LYMPHSABS 1.2  MONOABS 0.5  EOSABS 0.2  BASOSABS 0.0  Chemistries  Recent Labs  Lab 04/21/18 0904  NA 137  K 3.6  CL 106  CO2 25  GLUCOSE 146*  BUN 14  CREATININE 1.61*  CALCIUM 9.2  MG 1.9  AST 23  ALT 13*  ALKPHOS 77  BILITOT 0.5   ------------------------------------------------------------------------------------------------------------------ estimated creatinine clearance is 44.1 mL/min (A) (by C-G formula based on SCr of 1.61 mg/dL (H)). ------------------------------------------------------------------------------------------------------------------ No results for input(s): HGBA1C in the last 72 hours. ------------------------------------------------------------------------------------------------------------------ No results for input(s): CHOL, HDL, LDLCALC, TRIG, CHOLHDL, LDLDIRECT in the last 72 hours. ------------------------------------------------------------------------------------------------------------------ No results for input(s): TSH, T4TOTAL, T3FREE, THYROIDAB in the last 72 hours.  Invalid input(s): FREET3 ------------------------------------------------------------------------------------------------------------------ No results for input(s):  VITAMINB12, FOLATE, FERRITIN, TIBC, IRON, RETICCTPCT in the last 72 hours.  Coagulation profile No results for input(s): INR, PROTIME in the last 168 hours.  No results for input(s): DDIMER in the last 72 hours.  Cardiac Enzymes Recent Labs  Lab 04/21/18 0904  TROPONINI <0.03   ------------------------------------------------------------------------------------------------------------------ Invalid input(s): POCBNP    Assessment & Plan   77 year old male with past medical history of hypertension, lung cancer, coronary artery disease, anxiety, hyperlipidemia, previous history of BPH, COPD who presents to the hospital due to recurrent syncope.  1.  Syncope- Carotid Dopplers with less than 49% stenosis Echo of the heart pending Patient will need event monitoring outpatient  2.  History of essential hypertension-  Patient's blood pressure now higher we will discontinue midodrine  3.  COPD-no acute exacerbation.  Continue Spiriva, duo nebs as needed.  4.  Depression-continue Cymbalta.  5.  Lung cancer- patient is followed by Dr. Rogue Bussing,       Code Status Orders  (From admission, onward)        Start     Ordered   04/22/18 0837  Do not attempt resuscitation (DNR)  Continuous    Question Answer Comment  In the event of cardiac or respiratory ARREST Do not call a "code blue"   In the event of cardiac or respiratory ARREST Do not perform Intubation, CPR, defibrillation or ACLS   In the event of cardiac or respiratory ARREST Use medication by any route, position, wound care, and other measures to relive pain and suffering. May use oxygen, suction and manual treatment of airway obstruction as needed for comfort.      04/22/18 0836    Code Status History    Date Active Date Inactive Code Status Order ID Comments User Context   04/21/2018 1437 04/22/2018 0836 Full Code [id number]  Henreitta Leber, MD Inpatient   03/08/2016 1329 03/10/2016 1421 Full Code [id number]   Robert Bellow, MD Inpatient    Advance Directive Documentation     Most Recent Value  Type of Advance Directive  Healthcare Power of [place], Living will  Pre-existing out of facility DNR order (yellow form or pink MOST form)  -  "MOST" Form in Place?  -           Consults cardiology DVT Prophylaxis  Lovenox  Lab Results  Component Value Date   PLT 162 04/21/2018     Time Spent in minutes 35 minutes greater than 50% of time spent in care coordination and counseling patient regarding the condition and plan of care.   Dustin Flock M.D on 04/22/2018 at 3:05 PM  Between 7am to 6pm - Pager - [phone]  After 6pm go to [email or web address] - Proofreader  [employer]   Office  [phone]

## 2018-04-23 DIAGNOSIS — J449 Chronic obstructive pulmonary disease, unspecified: Secondary | ICD-10-CM | POA: Diagnosis not present

## 2018-04-23 DIAGNOSIS — I10 Essential (primary) hypertension: Secondary | ICD-10-CM | POA: Diagnosis not present

## 2018-04-23 DIAGNOSIS — I959 Hypotension, unspecified: Secondary | ICD-10-CM | POA: Diagnosis not present

## 2018-04-23 DIAGNOSIS — R55 Syncope and collapse: Secondary | ICD-10-CM | POA: Diagnosis not present

## 2018-04-23 DIAGNOSIS — C649 Malignant neoplasm of unspecified kidney, except renal pelvis: Secondary | ICD-10-CM | POA: Diagnosis not present

## 2018-04-23 MED ORDER — HYDRALAZINE HCL 20 MG/ML IJ SOLN
10.0000 mg | Freq: Once | INTRAMUSCULAR | Status: AC
  Administered 2018-04-23: 10 mg via INTRAVENOUS
  Filled 2018-04-23: qty 1

## 2018-04-23 MED ORDER — AMLODIPINE BESYLATE 5 MG PO TABS
5.0000 mg | ORAL_TABLET | Freq: Two times a day (BID) | ORAL | Status: DC
  Administered 2018-04-23: 5 mg via ORAL

## 2018-04-23 MED ORDER — TAMSULOSIN HCL 0.4 MG PO CAPS
0.4000 mg | ORAL_CAPSULE | Freq: Every morning | ORAL | Status: DC
  Administered 2018-04-23: 0.4 mg via ORAL
  Filled 2018-04-23: qty 1

## 2018-04-23 MED ORDER — AMLODIPINE BESYLATE 5 MG PO TABS
5.0000 mg | ORAL_TABLET | Freq: Two times a day (BID) | ORAL | Status: DC
  Filled 2018-04-23: qty 1

## 2018-04-23 MED ORDER — LOSARTAN POTASSIUM 50 MG PO TABS
50.0000 mg | ORAL_TABLET | Freq: Every morning | ORAL | Status: DC
  Administered 2018-04-23: 50 mg via ORAL
  Filled 2018-04-23: qty 1

## 2018-04-23 NOTE — Progress Notes (Signed)
Patient discharged today with daughter, holter monitor to be placed this afternoon in clinic, pt BP improving headache decreased, no complaints at this time. Pt educated about medication regimen and follow up appointments.

## 2018-04-23 NOTE — Discharge Summary (Signed)
[place], 77 y.o., DOB 1941/02/15, MRN [id number]. 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  Is noted to have significant orthostatic hypotension likely contributing to his symptoms.  Patient was monitored on telemetry without no arrhythmia.  Initially he did have some bradycardia but that was not related to his symptoms.  He was seen by cardiology who will place a Holter monitor on him.  Echocardiogram showed no significant abnormality.  Carotid arteries showed no significant carotid stenosis.  I have recommended patient to get TED hose to place to help assist with this.  He is already on midodrine.  He will need a close follow-up with his primary care provider to continue to address his orthostatic hypotension.              Consults  cardiology  Significant Tests:  See full reports for all details     Ct Head Wo Contrast  Result Date: 04/21/2018 CLINICAL DATA:  Left maxillary pain following a fall this morning. EXAM: CT HEAD WITHOUT CONTRAST CT MAXILLOFACIAL WITHOUT CONTRAST TECHNIQUE: Multidetector CT imaging of the head and maxillofacial structures were performed using the standard protocol without  intravenous contrast. Multiplanar CT image reconstructions of the maxillofacial structures were also generated. COMPARISON:  Brain MR dated 12/30/2017 and head CT dated 12/07/2017. FINDINGS: CT HEAD FINDINGS Brain: Mildly enlarged subarachnoid spaces. Normal size and position of the ventricles. No intracranial hemorrhage, mass lesion or CT evidence of acute infarction. Vascular: No hyperdense vessel or unexpected calcification. Skull: Normal. Negative for fracture or focal lesion. Other: Left parietal hematoma and soft tissue irregularity. CT MAXILLOFACIAL FINDINGS Osseous: No fracture or mandibular dislocation. No destructive process. Upper cervical spine degenerative changes. Orbits: Negative. No traumatic or inflammatory finding. Sinuses: Bilateral frontal, ethmoid and maxillary sinus mucosal thickening. There is also medium density fluid filling the majority of the left maxillary sinus. Soft tissues: Mild left infraorbital soft tissue swelling laterally. IMPRESSION: 1. Left parietal scalp hematoma and laceration without skull fracture or intracranial hemorrhage. 2. Left infraorbital soft tissue swelling laterally without maxillofacial fracture. 3. Chronic bilateral frontal, ethmoid and maxillary sinusitis with an acute component versus blood in the left maxillary sinus. 4. Mild diffuse cerebral and cerebellar cortical atrophy. Electronically Signed   By: Claudie Revering M.D.   On: 04/21/2018 11:39   US Carotid Bilateral  Result Date: 04/21/2018 CLINICAL DATA:  77 year old male with syncope EXAM: BILATERAL CAROTID DUPLEX ULTRASOUND TECHNIQUE: Pearline Cables scale imaging, color Doppler and duplex ultrasound were performed of bilateral carotid and vertebral arteries in the neck. COMPARISON:  CT scan of the head 04/21/2017 FINDINGS: Criteria: Quantification of carotid stenosis is based on velocity parameters that correlate the residual internal carotid diameter with NASCET-based stenosis levels, using the diameter of the  distal internal carotid lumen as the denominator for stenosis measurement. The following velocity measurements were obtained: RIGHT ICA: 121/34 cm/sec CCA: 46/9  cm/sec SYSTOLIC ICA/CCA RATIO:  2.6 ECA:  79 cm/sec LEFT ICA: 73/24 cm/sec CCA: 09/47 cm/sec SYSTOLIC ICA/CCA RATIO:  1.8 ECA:  66 cm/sec RIGHT CAROTID ARTERY: Trace smooth heterogeneous atherosclerotic plaque in the proximal internal carotid artery. By peak systolic velocity criteria, the estimated stenosis remains less than 50%. RIGHT VERTEBRAL ARTERY:  Patent with normal antegrade flow. LEFT CAROTID ARTERY: Trace heterogeneous atherosclerotic plaque in the proximal internal carotid artery. By peak systolic velocity criteria, the estimated stenosis remains less than 50%. LEFT VERTEBRAL ARTERY:  Patent with normal antegrade flow. IMPRESSION: 1. Mild (1-49%) stenosis proximal right internal carotid artery secondary to trace smooth heterogeneous atherosclerotic plaque. 2. Mild (1-49%) stenosis proximal left internal carotid artery secondary to trace smooth heterogeneous atherosclerotic plaque. 3. Vertebral arteries are patent with normal antegrade flow. Signed, Criselda Peaches, MD [facility] Electronically Signed   By: Jacqulynn Cadet M.D.   On: 04/21/2018 15:55   Ct Maxillofacial Wo Contrast  Result Date: 04/21/2018 CLINICAL DATA:  Left maxillary pain following a fall this morning. EXAM: CT HEAD WITHOUT CONTRAST CT MAXILLOFACIAL WITHOUT CONTRAST TECHNIQUE: Multidetector CT imaging of the head and maxillofacial structures were performed using the standard protocol without intravenous contrast. Multiplanar CT image reconstructions of the maxillofacial structures were also generated. COMPARISON:  Brain MR dated 12/30/2017 and head CT dated 12/07/2017. FINDINGS: CT HEAD FINDINGS Brain: Mildly enlarged subarachnoid spaces. Normal size and position of the ventricles. No intracranial hemorrhage,  mass lesion or CT evidence of acute infarction. Vascular: No hyperdense vessel or unexpected calcification. Skull: Normal. Negative for fracture or focal lesion. Other: Left parietal hematoma and soft tissue irregularity. CT MAXILLOFACIAL FINDINGS Osseous: No fracture or mandibular dislocation. No destructive process. Upper cervical spine degenerative changes. Orbits: Negative. No traumatic or inflammatory finding. Sinuses: Bilateral frontal, ethmoid and maxillary sinus mucosal thickening. There is also medium density fluid filling the majority of the left maxillary sinus. Soft tissues: Mild left infraorbital soft tissue swelling laterally. IMPRESSION: 1. Left parietal scalp hematoma and laceration without skull fracture or intracranial hemorrhage. 2. Left infraorbital soft tissue swelling laterally without maxillofacial fracture. 3. Chronic bilateral frontal, ethmoid and maxillary sinusitis with an acute component versus blood in the left maxillary sinus. 4. Mild diffuse cerebral and cerebellar cortical atrophy. Electronically Signed   By: Claudie Revering M.D.   On: 04/21/2018 11:39       Today   Subjective:   Flonnie Hailstone patient feeling much better Objective:   Blood pressure (!) 191/104, pulse 68, temperature 98.4 F (36.9 C), temperature source Oral, resp. rate 20, height 6\' 1"  (1.854 m), weight 88 kg (194 lb), SpO2 100 %.  .  Intake/Output Summary (Last 24 hours) at 04/23/2018 1452 Last data filed at 04/23/2018 0100 Gross per 24 hour  Intake 240 ml  Output 0 ml  Net 240 ml    Exam VITAL SIGNS: Blood pressure (!) 191/104, pulse 68, temperature 98.4 F (36.9 C), temperature source Oral, resp. rate 20, height 6\' 1"  (1.854 m), weight 88 kg (194 lb), SpO2 100 %.  GENERAL:  77 y.o.-year-old patient lying in the bed with no acute distress.  EYES: Pupils equal, round, reactive to light and accommodation. No scleral icterus. Extraocular muscles intact.  HEENT: Head atraumatic, normocephalic.  Oropharynx and nasopharynx clear.  NECK:  Supple, no jugular venous distention. No thyroid enlargement, no tenderness.  LUNGS: Normal breath sounds bilaterally, no wheezing, rales,rhonchi or crepitation. No use of accessory muscles of respiration.  CARDIOVASCULAR: S1,  S2 normal. No murmurs, rubs, or gallops.  ABDOMEN: Soft, nontender, nondistended. Bowel sounds present. No organomegaly or mass.  EXTREMITIES: No pedal edema, cyanosis, or clubbing.  NEUROLOGIC: Cranial nerves II through XII are intact. Muscle strength 5/5 in all extremities. Sensation intact. Gait not checked.  PSYCHIATRIC: The patient is alert and oriented x 3.  SKIN: Bruising on the face Data Review     CBC w Diff:  Lab Results  Component Value Date   WBC 7.1 04/21/2018   HGB 14.2 04/21/2018   HGB 13.9 05/25/2016   HCT 39.3 (L) 04/21/2018   HCT 38.7 05/25/2016   PLT 162 04/21/2018   PLT 177 05/25/2016   LYMPHOPCT 16 04/21/2018   MONOPCT 7 04/21/2018   EOSPCT 2 04/21/2018   BASOPCT 0 04/21/2018   CMP:  Lab Results  Component Value Date   NA 137 04/21/2018   NA 146 (H) 05/25/2016   K 3.6 04/21/2018   CL 106 04/21/2018   CO2 25 04/21/2018   BUN 14 04/21/2018   BUN 19 05/25/2016   CREATININE 1.61 (H) 04/21/2018   PROT 6.3 (L) 04/21/2018   ALBUMIN 3.6 04/21/2018   BILITOT 0.5 04/21/2018   ALKPHOS 77 04/21/2018   AST 23 04/21/2018   ALT 13 (L) 04/21/2018  .  Micro Results No results found for this or any previous visit (from the past 240 hour(s)).   Code Status History    Date Active Date Inactive Code Status Order ID Comments User Context   04/22/2018 0836 04/23/2018 1437 DNR [id number]  Dustin Flock, MD Inpatient   04/21/2018 1437 04/22/2018 0836 Full Code [id number]  Henreitta Leber, MD Inpatient   03/08/2016 1329 03/10/2016 1421 Full Code [id number]  Robert Bellow, MD Inpatient    Questions for Most Recent Historical Code Status (Order [id number])    Question Answer Comment   In the event of  cardiac or respiratory ARREST Do not call a "code blue"    In the event of cardiac or respiratory ARREST Do not perform Intubation, CPR, defibrillation or ACLS    In the event of cardiac or respiratory ARREST Use medication by any route, position, wound care, and other measures to relive pain and suffering. May use oxygen, suction and manual treatment of airway obstruction as needed for comfort.         Advance Directive Documentation     Most Recent Value  Type of Advance Directive  Healthcare Power of Attorney, Living will  Pre-existing out of facility DNR order (yellow form or pink MOST form)  -  "MOST" Form in Place?  -          Follow-up Information    Dion Body, MD. Go on 04/30/2018.   Specialty:  Family Medicine Why:  Appointment Time:4:00pm Contact information: [address] [place] [address] [phone]           Discharge Medications   Allergies as of 04/23/2018      Reactions   Lisinopril Swelling   Other Itching   Nitroglycerin Patch.      Medication List    STOP taking these medications   tiZANidine 4 MG tablet Commonly known as:  ZANAFLEX     TAKE these medications   albuterol 108 (90 Base) MCG/ACT inhaler Commonly known as:  PROVENTIL HFA;VENTOLIN HFA Inhale 2 puffs into the lungs every 6 (six) hours as needed for wheezing or shortness of breath.   amLODipine 5 MG tablet Commonly known as:  NORVASC Take 1 tablet (5 mg total) by mouth 2 (two) times daily.   aspirin EC 81 MG tablet Take 81 mg by mouth daily.   atorvastatin 40 MG tablet Commonly known as:  LIPITOR Take 40 mg by mouth daily.   axitinib 5 MG tablet Commonly known as:  INLYTA Take 1 tablet (5 mg total) by mouth 2 (two) times daily. What changed:  when to take this   busPIRone 5 MG tablet Commonly known as:  BUSPAR Take 2 tablets by mouth daily.   clopidogrel 75 MG tablet Commonly known as:  PLAVIX Take 75 mg by mouth daily.    docusate sodium 100 MG capsule Commonly known as:  COLACE Take 1 capsule (100 mg total) by mouth 2 (two) times daily.   dronabinol 2.5 MG capsule Commonly known as:  MARINOL Take 1 capsule (2.5 mg total) by mouth 2 (two) times daily before a meal.   DULoxetine 30 MG capsule Commonly known as:  CYMBALTA Take 2 capsules by mouth daily.   ferrous sulfate 325 (65 FE) MG EC tablet Take 325 mg by mouth 3 (three) times daily with meals.   Fish Oil 1000 MG Cpdr Take 1 capsule by mouth daily.   fluticasone 50 MCG/ACT nasal spray Commonly known as:  FLONASE Place 2 sprays into both nostrils daily.   Fluticasone-Salmeterol 500-50 MCG/DOSE Aepb Commonly known as:  ADVAIR DISKUS Inhale 1 puff into the lungs 2 (two) times daily.   GLUCOSAMINE CHOND COMPLEX/MSM Tabs Take 2 tablets by mouth daily.   ipratropium-albuterol 0.5-2.5 (3) MG/3ML Soln Commonly known as:  DUONEB Take 3 mLs by nebulization every 4 (four) hours as needed.   loratadine 10 MG tablet Commonly known as:  CLARITIN Take 10 mg by mouth daily.   losartan 50 MG tablet Commonly known as:  COZAAR Take 50 mg by mouth every morning.   MELATONIN PO Take 10 mg by mouth at bedtime.   metoprolol succinate 50 MG 24 hr tablet Commonly known as:  TOPROL-XL Take 50 mg by mouth every morning.   midodrine 2.5 MG tablet Commonly known as:  PROAMATINE Take 1 tablet (2.5 mg total) by mouth 2 (two) times daily with a meal.   montelukast 10 MG tablet Commonly known as:  SINGULAIR Take 1 tablet (10 mg total) by mouth at bedtime.   nitroGLYCERIN 0.4 MG SL tablet Commonly known as:  NITROSTAT Place 0.4 mg under the tongue every 5 (five) minutes as needed.   OXYGEN Inhale 2 L into the lungs at bedtime.   tamsulosin 0.4 MG Caps capsule Commonly known as:  FLOMAX TAKE 1 CAPSULE (0.4 MG TOTAL) BY MOUTH EVERY MORNING.   Tiotropium Bromide Monohydrate 1.25 MCG/ACT Aers Commonly known as:  SPIRIVA RESPIMAT Inhale 1.25 Act  into the lungs 2 (two) times daily.   TYLENOL 500 MG tablet Generic drug:  acetaminophen Take 500 mg by mouth every 6 (six) hours as needed for moderate pain.          Total Time in preparing paper work, data evaluation and todays exam - 81 minutes  Dustin Flock M.D on 04/23/2018 at 2:52 [place]  [phone]

## 2018-04-25 ENCOUNTER — Ambulatory Visit
Admission: RE | Admit: 2018-04-25 | Discharge: 2018-04-25 | Disposition: A | Discharge status: Home / Self Care | Payer: PPO | Source: Ambulatory Visit | Attending: Internal Medicine | Admitting: Internal Medicine

## 2018-04-25 DIAGNOSIS — C642 Malignant neoplasm of left kidney, except renal pelvis: Secondary | ICD-10-CM | POA: Diagnosis not present

## 2018-04-25 DIAGNOSIS — R55 Syncope and collapse: Secondary | ICD-10-CM | POA: Diagnosis not present

## 2018-04-25 DIAGNOSIS — Z905 Acquired absence of kidney: Secondary | ICD-10-CM | POA: Diagnosis not present

## 2018-04-25 DIAGNOSIS — C649 Malignant neoplasm of unspecified kidney, except renal pelvis: Secondary | ICD-10-CM | POA: Diagnosis not present

## 2018-04-25 DIAGNOSIS — J439 Emphysema, unspecified: Secondary | ICD-10-CM | POA: Insufficient documentation

## 2018-04-25 DIAGNOSIS — I700 Atherosclerosis of aorta: Secondary | ICD-10-CM | POA: Diagnosis not present

## 2018-04-25 DIAGNOSIS — C7951 Secondary malignant neoplasm of bone: Secondary | ICD-10-CM | POA: Diagnosis not present

## 2018-04-29 ENCOUNTER — Other Ambulatory Visit: Payer: Self-pay | Admitting: Oncology

## 2018-04-30 ENCOUNTER — Ambulatory Visit: Payer: PPO | Admitting: Internal Medicine

## 2018-04-30 ENCOUNTER — Telehealth: Payer: Self-pay | Admitting: *Deleted

## 2018-04-30 ENCOUNTER — Other Ambulatory Visit: Payer: PPO

## 2018-04-30 NOTE — Telephone Encounter (Signed)
Pt's wife's call returned 04/29/18 - wife requesting ct scan results for patient.  Results reviewed with Sonia Baller, NP. New t-9 lesion present. Dr. Massie Maroon in radiation oncology reviewed imaging. If patient is asymptomatic of back pain, will hold off treating T-9 lesion at this time.  I spoke with wife and patient. Pt denies any back pain. Plan is to keep apt with Dr. Rogue Bussing on 6/24 as directed. Patient and pt's wife gave verbal understanding.

## 2018-05-05 ENCOUNTER — Other Ambulatory Visit: Payer: PPO

## 2018-05-05 ENCOUNTER — Ambulatory Visit: Payer: PPO | Admitting: Internal Medicine

## 2018-05-05 DIAGNOSIS — I491 Atrial premature depolarization: Secondary | ICD-10-CM | POA: Diagnosis not present

## 2018-05-08 ENCOUNTER — Other Ambulatory Visit: Payer: Self-pay | Admitting: *Deleted

## 2018-05-08 ENCOUNTER — Encounter: Payer: Self-pay | Admitting: Internal Medicine

## 2018-05-08 DIAGNOSIS — C642 Malignant neoplasm of left kidney, except renal pelvis: Secondary | ICD-10-CM

## 2018-05-12 ENCOUNTER — Inpatient Hospital Stay (HOSPITAL_BASED_OUTPATIENT_CLINIC_OR_DEPARTMENT_OTHER): Payer: PPO | Admitting: Internal Medicine

## 2018-05-12 ENCOUNTER — Encounter: Payer: Self-pay | Admitting: Internal Medicine

## 2018-05-12 ENCOUNTER — Inpatient Hospital Stay: Payer: PPO

## 2018-05-12 VITALS — BP 134/85 | HR 66 | Temp 97.3°F | Resp 16 | Wt 203.2 lb

## 2018-05-12 DIAGNOSIS — I2510 Atherosclerotic heart disease of native coronary artery without angina pectoris: Secondary | ICD-10-CM | POA: Diagnosis not present

## 2018-05-12 DIAGNOSIS — E079 Disorder of thyroid, unspecified: Secondary | ICD-10-CM

## 2018-05-12 DIAGNOSIS — I10 Essential (primary) hypertension: Secondary | ICD-10-CM | POA: Diagnosis not present

## 2018-05-12 DIAGNOSIS — Z9221 Personal history of antineoplastic chemotherapy: Secondary | ICD-10-CM

## 2018-05-12 DIAGNOSIS — Z87442 Personal history of urinary calculi: Secondary | ICD-10-CM | POA: Diagnosis not present

## 2018-05-12 DIAGNOSIS — J449 Chronic obstructive pulmonary disease, unspecified: Secondary | ICD-10-CM

## 2018-05-12 DIAGNOSIS — I951 Orthostatic hypotension: Secondary | ICD-10-CM

## 2018-05-12 DIAGNOSIS — Z7982 Long term (current) use of aspirin: Secondary | ICD-10-CM | POA: Diagnosis not present

## 2018-05-12 DIAGNOSIS — R918 Other nonspecific abnormal finding of lung field: Secondary | ICD-10-CM

## 2018-05-12 DIAGNOSIS — C642 Malignant neoplasm of left kidney, except renal pelvis: Secondary | ICD-10-CM

## 2018-05-12 DIAGNOSIS — Z87891 Personal history of nicotine dependence: Secondary | ICD-10-CM

## 2018-05-12 DIAGNOSIS — C7951 Secondary malignant neoplasm of bone: Secondary | ICD-10-CM

## 2018-05-12 DIAGNOSIS — Z85820 Personal history of malignant melanoma of skin: Secondary | ICD-10-CM | POA: Diagnosis not present

## 2018-05-12 DIAGNOSIS — Z79899 Other long term (current) drug therapy: Secondary | ICD-10-CM | POA: Diagnosis not present

## 2018-05-12 DIAGNOSIS — C342 Malignant neoplasm of middle lobe, bronchus or lung: Secondary | ICD-10-CM

## 2018-05-12 DIAGNOSIS — R21 Rash and other nonspecific skin eruption: Secondary | ICD-10-CM

## 2018-05-12 LAB — COMPREHENSIVE METABOLIC PANEL
ALT: 14 U/L — AB (ref 17–63)
AST: 19 U/L (ref 15–41)
Albumin: 4 g/dL (ref 3.5–5.0)
Alkaline Phosphatase: 97 U/L (ref 38–126)
Anion gap: 9 (ref 5–15)
BUN: 16 mg/dL (ref 6–20)
CHLORIDE: 104 mmol/L (ref 101–111)
CO2: 24 mmol/L (ref 22–32)
CREATININE: 1.37 mg/dL — AB (ref 0.61–1.24)
Calcium: 9.4 mg/dL (ref 8.9–10.3)
GFR calc Af Amer: 56 mL/min — ABNORMAL LOW (ref >60)
GFR calc non Af Amer: 49 mL/min — ABNORMAL LOW (ref >60)
GLUCOSE: 118 mg/dL — AB (ref 65–99)
Potassium: 4 mmol/L (ref 3.5–5.1)
SODIUM: 137 mmol/L (ref 135–145)
Total Bilirubin: 0.8 mg/dL (ref 0.3–1.2)
Total Protein: 6.9 g/dL (ref 6.5–8.1)

## 2018-05-12 LAB — CBC WITH DIFFERENTIAL/PLATELET
Basophils Absolute: 0.1 10*3/uL (ref 0–0.1)
Basophils Relative: 1 %
EOS ABS: 0.1 10*3/uL (ref 0–0.7)
EOS PCT: 2 %
HCT: 44.1 % (ref 40.0–52.0)
HEMOGLOBIN: 15.7 g/dL (ref 13.0–18.0)
LYMPHS ABS: 1.7 10*3/uL (ref 1.0–3.6)
Lymphocytes Relative: 23 %
MCH: 33.1 pg (ref 26.0–34.0)
MCHC: 35.5 g/dL (ref 32.0–36.0)
MCV: 93.2 fL (ref 80.0–100.0)
MONO ABS: 0.6 10*3/uL (ref 0.2–1.0)
MONOS PCT: 8 %
Neutro Abs: 4.8 10*3/uL (ref 1.4–6.5)
Neutrophils Relative %: 66 %
PLATELETS: 163 10*3/uL (ref 150–440)
RBC: 4.73 MIL/uL (ref 4.40–5.90)
RDW: 15.1 % — ABNORMAL HIGH (ref 11.5–14.5)
WBC: 7.2 10*3/uL (ref 3.8–10.6)

## 2018-05-12 LAB — LACTATE DEHYDROGENASE: LDH: 143 U/L (ref 98–192)

## 2018-05-12 MED ORDER — METOPROLOL SUCCINATE ER 25 MG PO TB24: 25.0000 mg | ORAL_TABLET | Freq: Every day | ORAL | 3 refills | Status: DC

## 2018-05-12 MED ORDER — LOSARTAN POTASSIUM 25 MG PO TABS: 50.0000 mg | ORAL_TABLET | ORAL | 3 refills | Status: DC

## 2018-05-12 NOTE — Progress Notes (Signed)
[place] OFFICE PROGRESS NOTE  Patient Care Team: Dion Body, MD as PCP - General (Family Medicine) Jannet Mantis, MD (Dermatology) Bary Castilla, Forest Gleason, MD (General Surgery) Hollice Espy, MD as Consulting Physician (Urology) Cammie Sickle, MD as Medical Oncologist (Medical Oncology) Isaias Cowman, MD as Consulting Physician (Cardiology)  Cancer Staging No matching staging information was found for the patient.   Oncology History   # MAY- June 2017- METASTATIC CLEAR CELL; LEFT RENAL CA/STAGE IV; [place]- G-3; INTERMEDIATE RISK;  [bil Pul lung nodules;incidental s/p Bx Dr.Byrnett/Dr.Oaks] s/p Cytoreductive nephrectomy; Dr.Brandon- [id number]; July 11th 2017 CT- Enlarging Lung nodules  # Aug 7th 2017- PAZOPANIB; STOP SEP 2017 [pancreatitis/poor tol]  # OCT 1 week- START NIVO q 2W x4; DEC 8th CT- "Progression"; Continue Opdivo;APRIL-MAY 2018- STABLE LUNG NODULES [stopped sec to severe cough; NO Pneumonitis]  # May 31st 2018- [place] 60mg /day; July 31st CT lung- improved lung nodules; Sep 1st week- cabo- 40mg /day [dose reduced sec to mult intol]; Oct 1st 2week-[place] 20mg /day;   # NOV 15th 2018- CT chest- slight progression of lung nodules [poor tol to higer doses];   # Nov 28th 2018- SUTENT 50 mg 2w-on  &1 w-OFF;  # MARCH 2019- Progression; start Axitinib 5 mg BID  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  male pleasant patient above history of clear cell lung cancer currently on  axitinib is here today with results of his CT scan.  Patient interim was admitted to the hospital for a fall hitting his head.  This was attributed to significant orthostasis.  Cardiology evaluated patient; adjusted his blood pressure medications.  Patient continues to feel poorly.  He continues to have significant drop of his blood pressures on standing.  He feels dizzy.   Review of Systems  Constitutional: Positive for malaise/fatigue. Negative for chills, diaphoresis, fever and weight loss.  HENT: Negative for nosebleeds and sore throat.   Eyes: Negative for double vision.  Respiratory: Positive for cough. Negative for hemoptysis, sputum production, shortness of breath and wheezing.   Cardiovascular: Negative for chest pain, palpitations, orthopnea and leg swelling.  Gastrointestinal: Negative for abdominal pain, blood in stool, constipation, diarrhea, heartburn, melena, nausea and vomiting.  Genitourinary: Negative for dysuria, frequency and urgency.  Musculoskeletal: Positive for joint pain and neck pain.  Skin: Negative.  Negative for itching and rash.  Neurological: Positive for dizziness. Negative for tingling, focal weakness, weakness and headaches.  Endo/Heme/Allergies: Does not bruise/bleed easily.  Psychiatric/Behavioral: Negative for depression. The patient is not nervous/anxious and does not have insomnia.       PAST MEDICAL HISTORY :  Past Medical History:  Diagnosis Date  . CAD (coronary artery disease)   . Cancer ([place])    left arm  . COPD (chronic obstructive pulmonary disease) ([place])   . Hypertension   . Kidney stone   . Lung cancer ([place])   . Melanoma ([place]) 01/24/2016   right shoulder  . Thyroid nodule 02/02/2016   left BENIGN THYROID NODULE by FNA    PAST SURGICAL HISTORY :   Past Surgical History:  Procedure Laterality Date  . arm surgery Left    arm  . cardiac stents  2011   Angioplasty / Stenting Femoral-X2  . COLONOSCOPY  04/01/12  . CORONARY  ANGIOPLASTY    . EXCISION MELANOMA WITH SENTINEL LYMPH NODE BIOPSY Right 01/24/2016   Procedure: EXCISION MELANOMA Right Shoulder;  Surgeon: Robert Bellow, MD;  Location: [facility] ORS;  Service: General;  Laterality: Right;  . LAPAROSCOPIC NEPHRECTOMY, HAND ASSISTED Left 04/24/2016   Procedure: HAND ASSISTED LAPAROSCOPIC NEPHRECTOMY;  Surgeon: Hollice Espy, MD;  Location: [facility] ORS;  Service: Urology;  Laterality: Left;  Marland Kitchen VIDEO ASSISTED THORACOSCOPY (VATS)/THOROCOTOMY Left 03/08/2016   Procedure: VIDEO ASSISTED THORACOSCOPY (VATS) with lung biopsy - Left ;  Surgeon: Robert Bellow, MD;  Location: [facility] ORS;  Service: General;  Laterality: Left;    FAMILY HISTORY :   Family History  Problem Relation Age of Onset  . Hodgkin's lymphoma Daughter 5  . Heart attack Father   . Kidney cancer Neg Hx   . Kidney disease Neg Hx   . Prostate cancer Neg Hx     SOCIAL HISTORY:   Social History   Tobacco Use  . Smoking status: Former Smoker    Packs/day: 2.00    Years: 30.00    Pack years: 60.00    Types: Cigarettes    Start date: 01/18/1956    Last attempt to quit: 01/17/1969    Years since quitting: 49.3  . Smokeless tobacco: Never Used  Substance Use Topics  . Alcohol use: No    Alcohol/week: 0.0 oz    Comment: BEER OCC  . Drug use: No    ALLERGIES:  is allergic to lisinopril and other.  MEDICATIONS:  Current Outpatient Medications  Medication Sig Dispense Refill  . acetaminophen (TYLENOL) 500 MG tablet Take 500 mg by mouth every 6 (six) hours as needed for moderate pain.    Marland Kitchen albuterol (PROVENTIL HFA;VENTOLIN HFA) 108 (90 Base) MCG/ACT inhaler Inhale 2 puffs into the lungs every 6 (six) hours as needed for wheezing or shortness of breath. 1 Inhaler 2  . amLODipine (NORVASC) 5 MG tablet Take 1 tablet (5 mg total) by mouth 2 (two) times daily. (Patient taking differently: Take 5 mg by mouth daily. ) 60 tablet 0  . aspirin EC 81 MG tablet Take 81 mg by mouth daily.    Marland Kitchen atorvastatin  (LIPITOR) 40 MG tablet Take 40 mg by mouth daily.    Marland Kitchen axitinib (INLYTA) 5 MG tablet Take 1 tablet (5 mg total) by mouth 2 (two) times daily. (Patient taking differently: Take 5 mg by mouth daily. ) 60 tablet 4  . busPIRone (BUSPAR) 5 MG tablet Take 2 tablets by mouth daily.    . clopidogrel (PLAVIX) 75 MG tablet Take 75 mg by mouth daily.    Marland Kitchen docusate sodium (COLACE) 100 MG capsule Take 1 capsule (100 mg total) by mouth 2 (two) times daily. 60 capsule 3  . dronabinol (MARINOL) 2.5 MG capsule Take 1 capsule (2.5 mg total) by mouth 2 (two) times daily before a meal. 60 capsule 1  . DULoxetine (CYMBALTA) 30 MG capsule Take 2 capsules by mouth daily.    . ferrous sulfate 325 (65 FE) MG EC tablet Take 325 mg by mouth 3 (three) times daily with meals.    . fluticasone (FLONASE) 50 MCG/ACT nasal spray Place 2 sprays into both nostrils daily. 16 g 2  . Fluticasone-Salmeterol (ADVAIR DISKUS) 500-50 MCG/DOSE AEPB Inhale 1 puff into the lungs 2 (  two) times daily. 60 each 11  . ipratropium-albuterol (DUONEB) 0.5-2.5 (3) MG/3ML SOLN Take 3 mLs by nebulization every 4 (four) hours as needed. 360 mL 3  . loratadine (CLARITIN) 10 MG tablet Take 10 mg by mouth daily.    Marland Kitchen losartan (COZAAR) 25 MG tablet Take 2 tablets (50 mg total) by mouth every morning. 60 tablet 3  . MELATONIN PO Take 10 mg by mouth at bedtime.     . metoprolol succinate (TOPROL-XL) 50 MG 24 hr tablet Take 50 mg by mouth every morning.     . midodrine (PROAMATINE) 2.5 MG tablet Take 1 tablet (2.5 mg total) by mouth 2 (two) times daily with a meal. 60 tablet 0  . Misc Natural Products (GLUCOSAMINE CHOND COMPLEX/MSM) TABS Take 2 tablets by mouth daily.    . montelukast (SINGULAIR) 10 MG tablet Take 1 tablet (10 mg total) by mouth at bedtime. 30 tablet 3  . nitroGLYCERIN (NITROSTAT) 0.4 MG SL tablet Place 0.4 mg under the tongue every 5 (five) minutes as needed.     . Omega-3 Fatty Acids (FISH OIL) 1000 MG CPDR Take 1 capsule by mouth daily.     . OXYGEN Inhale 2 L into the lungs at bedtime.    . Tiotropium Bromide Monohydrate (SPIRIVA RESPIMAT) 1.25 MCG/ACT AERS Inhale 1.25 Act into the lungs 2 (two) times daily. 1 Inhaler 11  . metoprolol succinate (TOPROL XL) 25 MG 24 hr tablet Take 1 tablet (25 mg total) by mouth daily. Take it along with 50 of toprol xl once a day. 60 tablet 3  . midodrine (PROAMATINE) 5 MG tablet TAKE 1 TABLET (5 MG TOTAL) BY MOUTH 3 (THREE) TIMES DAILY WITH MEALS. 90 tablet 1  . tamsulosin (FLOMAX) 0.4 MG CAPS capsule TAKE 1 CAPSULE (0.4 MG TOTAL) BY MOUTH EVERY MORNING. 30 capsule 1   No current facility-administered medications for this visit.     PHYSICAL EXAMINATION: ECOG PERFORMANCE STATUS: 1 - Symptomatic but completely ambulatory  BP 134/85 (BP Location: Left Arm, Patient Position: Sitting)   Pulse 66   Temp (!) 97.3 F (36.3 C) (Tympanic)   Resp 16   Wt 203 lb 3.2 oz (92.2 kg)   BMI 26.81 kg/m   Filed Weights   05/12/18 1432  Weight: 203 lb 3.2 oz (92.2 kg)    GENERAL: Well-nourished well-developed; Alert, no distress and comfortable.  Accompanied by family.  EYES: no pallor or icterus OROPHARYNX: no thrush or ulceration; NECK: supple; no lymph nodes felt. LYMPH:  no palpable lymphadenopathy in the axillary or inguinal regions LUNGS: Decreased breath sounds auscultation bilaterally. No wheeze or crackles HEART/CVS: regular rate & rhythm and no murmurs; No lower extremity edema ABDOMEN:abdomen soft, non-tender and normal bowel sounds. No hepatomegaly or splenomegaly.  Musculoskeletal:no cyanosis of digits and no clubbing  PSYCH: alert & oriented x 3 with fluent speech NEURO: no focal motor/sensory deficits SKIN:  no rashes or significant lesions    LABORATORY DATA:  I have reviewed the data as listed    Component Value Date/Time   NA 137 05/12/2018 1416   NA 146 (H) 05/25/2016 1514   K 4.0 05/12/2018 1416   CL 104 05/12/2018 1416   CO2 24 05/12/2018 1416   GLUCOSE 118 (H)  05/12/2018 1416   BUN 16 05/12/2018 1416   BUN 19 05/25/2016 1514   CREATININE 1.37 (H) 05/12/2018 1416   CALCIUM 9.4 05/12/2018 1416   PROT 6.9 05/12/2018 1416   ALBUMIN 4.0 05/12/2018 1416  AST 19 05/12/2018 1416   ALT 14 (L) 05/12/2018 1416   ALKPHOS 97 05/12/2018 1416   BILITOT 0.8 05/12/2018 1416   GFRNONAA 49 (L) 05/12/2018 1416   GFRAA 56 (L) 05/12/2018 1416    No results found for: SPEP, UPEP  Lab Results  Component Value Date   WBC 7.2 05/12/2018   NEUTROABS 4.8 05/12/2018   HGB 15.7 05/12/2018   HCT 44.1 05/12/2018   MCV 93.2 05/12/2018   PLT 163 05/12/2018      Chemistry      Component Value Date/Time   NA 137 05/12/2018 1416   NA 146 (H) 05/25/2016 1514   K 4.0 05/12/2018 1416   CL 104 05/12/2018 1416   CO2 24 05/12/2018 1416   BUN 16 05/12/2018 1416   BUN 19 05/25/2016 1514   CREATININE 1.37 (H) 05/12/2018 1416      Component Value Date/Time   CALCIUM 9.4 05/12/2018 1416   ALKPHOS 97 05/12/2018 1416   AST 19 05/12/2018 1416   ALT 14 (L) 05/12/2018 1416   BILITOT 0.8 05/12/2018 1416       RADIOGRAPHIC STUDIES: I have personally reviewed the radiological images as listed and agreed with the findings in the report. No results found.   ASSESSMENT & PLAN:  Cancer of left kidney excluding renal pelvis ([place])  # Left kidney cancer metastatic to the lung-currently on axitinib 10 mg once a day; June 2019 CT scan shows stable to improved lung lesions; however multiple lytic lesions in the bones.- MIXED RESPONSE.  #Given the patient's extreme difficulty with multiple medication adverse events-recommend continued axitinib at this time.  #Multiple lytic lesions in the bones-recommend Xgeva; discussed the potential side effects of hypocalcemia/ONJ.  Recommend calcium and vitamin D.  #Rash on soles-secondary to axitinib grade 1-2; worsened; urea cream/Vaseline; insoles; thick socks.   #Poorly controlled blood pressure/labile blood pressures- 150/ 90s; with  significant orthostasis; discussed with cardiology; discussed with Dr. Saralyn Pilar cardiology; change toporol/losartan.  As per family,recommend second opinion at [facility] to evaluate/possible treatment further for autonomic cause of hypotension.  # Will order [facility] for molecular testing.   # x-geva shot; in 3 weeks- MD/ no labs.    Orders Placed This Encounter  Procedures  . Ambulatory referral to Cardiology    Referral Priority:   Routine    Referral Type:   Consultation    Referral Reason:   Specialty Services Required    Referred to Provider:   Mitzie Na, MD    Requested Specialty:   Cardiology    Number of Visits Requested:   1   All questions were answered. The patient knows to call the clinic with any problems, questions or concerns.      Cammie Sickle, MD 05/18/2018 6:01 PM

## 2018-05-12 NOTE — Assessment & Plan Note (Addendum)
#   Left kidney cancer metastatic to the lung-currently on axitinib 10 mg once a day; June 2019 CT scan shows stable to improved lung lesions; however multiple lytic lesions in the bones.- MIXED RESPONSE.  #Given the patient's extreme difficulty with multiple medication adverse events-recommend continued axitinib at this time.  #Multiple lytic lesions in the bones-recommend Xgeva; discussed the potential side effects of hypocalcemia/ONJ.  Recommend calcium and vitamin D.  #Rash on soles-secondary to axitinib grade 1-2; worsened; urea cream/Vaseline; insoles; thick socks.   #Poorly controlled blood pressure/labile blood pressures- 150/ 90s; with significant orthostasis; discussed with cardiology; discussed with Dr. Saralyn Pilar cardiology; change toporol/losartan.  As per family,recommend second opinion at [facility] to evaluate/possible treatment further for autonomic cause of hypotension.  # Will order [facility] for molecular testing.   # x-geva shot; in 3 weeks- MD/ no labs.

## 2018-05-14 ENCOUNTER — Other Ambulatory Visit: Payer: Self-pay | Admitting: Internal Medicine

## 2018-05-14 DIAGNOSIS — M5432 Sciatica, left side: Secondary | ICD-10-CM | POA: Diagnosis not present

## 2018-05-18 DIAGNOSIS — C7951 Secondary malignant neoplasm of bone: Secondary | ICD-10-CM | POA: Insufficient documentation

## 2018-05-19 ENCOUNTER — Other Ambulatory Visit: Payer: Self-pay | Admitting: *Deleted

## 2018-05-19 DIAGNOSIS — C642 Malignant neoplasm of left kidney, except renal pelvis: Secondary | ICD-10-CM | POA: Diagnosis not present

## 2018-05-19 DIAGNOSIS — J449 Chronic obstructive pulmonary disease, unspecified: Secondary | ICD-10-CM | POA: Diagnosis not present

## 2018-05-19 DIAGNOSIS — R05 Cough: Secondary | ICD-10-CM | POA: Diagnosis not present

## 2018-05-19 DIAGNOSIS — I951 Orthostatic hypotension: Secondary | ICD-10-CM

## 2018-05-19 DIAGNOSIS — R0602 Shortness of breath: Secondary | ICD-10-CM | POA: Diagnosis not present

## 2018-05-23 ENCOUNTER — Telehealth: Payer: Self-pay | Admitting: *Deleted

## 2018-05-23 NOTE — Telephone Encounter (Signed)
Wife called cancer center left vm. Pt unable to get an apt with Dr. Dora Sims any sooner than September. Office told wife that if Dr. Jacinto Reap would call the cardiologist personally, her husband may be able to get a sooner apt. Phone [phone].

## 2018-05-26 ENCOUNTER — Telehealth: Payer: Self-pay | Admitting: *Deleted

## 2018-05-26 NOTE — Telephone Encounter (Signed)
I contacted Dr. Vivi Martens ofice at [phone] on behalf of Dr. Rogue Bussing. This was the 2nd attempt to reach this team. I explained that Dr. B already attempted to page Dr Vivi Martens before lunch today and was not successful in reaching this provider.  I received a return phone call from cardiology office. Patient's apt can be expedited and be evaluated tomorrow; however, pt will need to go to [place] office (apt at 1 pm). Office attempted to reach patient x 2 but was unsuccessful. I contacted the patient and spoke with pt's wife. She states that she was outside when cardiologist called her. I gave her the phone number to the office and asked her to speak to [place].  Wife gave verbal understanding of the plan. States that pt's bp yesterday remained 78/46 from lunch time yesterday to around 10 pm yesterday evening. Patient had passed out twice this weekend. Wife prevented pt's fall on concrete.  Cardiologist office called back at [phone] and confirmed pt's apt with Dr. Billie Ruddy at 1pm tomorrow.

## 2018-05-27 DIAGNOSIS — R55 Syncope and collapse: Secondary | ICD-10-CM | POA: Diagnosis not present

## 2018-05-27 DIAGNOSIS — I951 Orthostatic hypotension: Secondary | ICD-10-CM | POA: Diagnosis not present

## 2018-05-29 MED FILL — INLYTA 5 MG TABLET: 5 | 30 days supply | Qty: 60 | Fill #3

## 2018-06-02 ENCOUNTER — Inpatient Hospital Stay: Payer: PPO | Attending: Internal Medicine | Admitting: Internal Medicine

## 2018-06-02 ENCOUNTER — Other Ambulatory Visit: Payer: Self-pay | Admitting: *Deleted

## 2018-06-02 ENCOUNTER — Inpatient Hospital Stay: Payer: PPO

## 2018-06-02 ENCOUNTER — Ambulatory Visit
Admission: RE | Admit: 2018-06-02 | Discharge: 2018-06-02 | Disposition: A | Discharge status: Home / Self Care | Payer: PPO | Source: Ambulatory Visit | Attending: Internal Medicine | Admitting: Internal Medicine

## 2018-06-02 ENCOUNTER — Other Ambulatory Visit: Payer: Self-pay

## 2018-06-02 ENCOUNTER — Encounter: Payer: Self-pay | Admitting: Internal Medicine

## 2018-06-02 VITALS — BP 160/95 | HR 76 | Temp 97.6°F | Resp 20 | Ht 73.0 in | Wt 201.0 lb

## 2018-06-02 DIAGNOSIS — I2510 Atherosclerotic heart disease of native coronary artery without angina pectoris: Secondary | ICD-10-CM | POA: Diagnosis not present

## 2018-06-02 DIAGNOSIS — C642 Malignant neoplasm of left kidney, except renal pelvis: Secondary | ICD-10-CM | POA: Diagnosis not present

## 2018-06-02 DIAGNOSIS — I10 Essential (primary) hypertension: Secondary | ICD-10-CM | POA: Diagnosis not present

## 2018-06-02 DIAGNOSIS — R918 Other nonspecific abnormal finding of lung field: Secondary | ICD-10-CM

## 2018-06-02 DIAGNOSIS — M2550 Pain in unspecified joint: Secondary | ICD-10-CM | POA: Diagnosis not present

## 2018-06-02 DIAGNOSIS — Z7982 Long term (current) use of aspirin: Secondary | ICD-10-CM | POA: Diagnosis not present

## 2018-06-02 DIAGNOSIS — M549 Dorsalgia, unspecified: Secondary | ICD-10-CM | POA: Diagnosis not present

## 2018-06-02 DIAGNOSIS — Z9221 Personal history of antineoplastic chemotherapy: Secondary | ICD-10-CM

## 2018-06-02 DIAGNOSIS — R634 Abnormal weight loss: Secondary | ICD-10-CM

## 2018-06-02 DIAGNOSIS — M899 Disorder of bone, unspecified: Secondary | ICD-10-CM | POA: Diagnosis not present

## 2018-06-02 DIAGNOSIS — E041 Nontoxic single thyroid nodule: Secondary | ICD-10-CM | POA: Diagnosis not present

## 2018-06-02 DIAGNOSIS — S3992XA Unspecified injury of lower back, initial encounter: Secondary | ICD-10-CM | POA: Diagnosis not present

## 2018-06-02 DIAGNOSIS — Z85820 Personal history of malignant melanoma of skin: Secondary | ICD-10-CM

## 2018-06-02 DIAGNOSIS — Z87891 Personal history of nicotine dependence: Secondary | ICD-10-CM | POA: Diagnosis not present

## 2018-06-02 DIAGNOSIS — C7951 Secondary malignant neoplasm of bone: Secondary | ICD-10-CM

## 2018-06-02 DIAGNOSIS — Z87442 Personal history of urinary calculi: Secondary | ICD-10-CM | POA: Diagnosis not present

## 2018-06-02 DIAGNOSIS — Z79899 Other long term (current) drug therapy: Secondary | ICD-10-CM

## 2018-06-02 DIAGNOSIS — J449 Chronic obstructive pulmonary disease, unspecified: Secondary | ICD-10-CM | POA: Diagnosis not present

## 2018-06-02 LAB — CBC WITH DIFFERENTIAL/PLATELET
Basophils Absolute: 0 10*3/uL (ref 0–0.1)
Basophils Relative: 1 %
EOS ABS: 0.1 10*3/uL (ref 0–0.7)
EOS PCT: 1 %
HCT: 44.8 % (ref 40.0–52.0)
Hemoglobin: 16.1 g/dL (ref 13.0–18.0)
LYMPHS ABS: 1.9 10*3/uL (ref 1.0–3.6)
Lymphocytes Relative: 21 %
MCH: 32.9 pg (ref 26.0–34.0)
MCHC: 35.9 g/dL (ref 32.0–36.0)
MCV: 91.6 fL (ref 80.0–100.0)
MONOS PCT: 7 %
Monocytes Absolute: 0.6 10*3/uL (ref 0.2–1.0)
Neutro Abs: 6.2 10*3/uL (ref 1.4–6.5)
Neutrophils Relative %: 70 %
PLATELETS: 117 10*3/uL — AB (ref 150–440)
RBC: 4.89 MIL/uL (ref 4.40–5.90)
RDW: 15.3 % — ABNORMAL HIGH (ref 11.5–14.5)
WBC: 8.9 10*3/uL (ref 3.8–10.6)

## 2018-06-02 LAB — COMPREHENSIVE METABOLIC PANEL
ALT: 22 U/L (ref 0–44)
ANION GAP: 8 (ref 5–15)
AST: 29 U/L (ref 15–41)
Albumin: 4 g/dL (ref 3.5–5.0)
Alkaline Phosphatase: 104 U/L (ref 38–126)
BUN: 16 mg/dL (ref 8–23)
CALCIUM: 9.6 mg/dL (ref 8.9–10.3)
CHLORIDE: 103 mmol/L (ref 98–111)
CO2: 26 mmol/L (ref 22–32)
Creatinine, Ser: 1.25 mg/dL — ABNORMAL HIGH (ref 0.61–1.24)
GFR calc non Af Amer: 54 mL/min — ABNORMAL LOW (ref >60)
Glucose, Bld: 141 mg/dL — ABNORMAL HIGH (ref 70–99)
Potassium: 3.8 mmol/L (ref 3.5–5.1)
SODIUM: 137 mmol/L (ref 135–145)
Total Bilirubin: 1 mg/dL (ref 0.3–1.2)
Total Protein: 7 g/dL (ref 6.5–8.1)

## 2018-06-02 LAB — FOLATE: Folate: 18.2 ng/mL (ref >5.9)

## 2018-06-02 LAB — VITAMIN B12: VITAMIN B 12: 507 pg/mL (ref 180–914)

## 2018-06-02 MED ORDER — DRONABINOL 2.5 MG PO CAPS: 2.5000 mg | ORAL_CAPSULE | Freq: Two times a day (BID) | ORAL | 1 refills | Status: DC

## 2018-06-02 MED ORDER — DENOSUMAB 120 MG/1.7ML ~~LOC~~ SOLN
120.0000 mg | Freq: Once | SUBCUTANEOUS | Status: AC
  Administered 2018-06-02: 120 mg via SUBCUTANEOUS
  Filled 2018-06-02: qty 1.7

## 2018-06-02 NOTE — Progress Notes (Signed)
Patient requesting labs from Dr. Margrett Rud to be drawn to r/o dysautonomia evaluation.

## 2018-06-02 NOTE — Assessment & Plan Note (Addendum)
#   Left kidney cancer metastatic to the lung-currently on axitinib 10 mg once a day; June 2019 CT scan shows stable to improved lung lesions; however multiple lytic lesions in the bones.- MIXED RESPONSE.  #Given the patient's extreme difficulty with multiple medication adverse events-recommend continued axitinib 5 mg twice daily at this time.  #Multiple lytic lesions in the bones-Xgeva today; Recommend calcium and vitamin D.  Worsening back pain check lumbar x-rays.  #Poorly controlled blood pressure/labile blood pressures- 150/ 90s; with significant orthostasis; status post evaluation with Dr.Mobarek.  As per recommendation will order urine/ serum electrophoresis; B12 folate;  # Will order [facility] for molecular testing.   # x-geva shot today; follow-up in 3 weeks- MD/ no labs.

## 2018-06-02 NOTE — Progress Notes (Signed)
New  OFFICE PROGRESS NOTE  Patient Care Team: Dion Body, MD as PCP - General (Family Medicine) Jannet Mantis, MD (Dermatology) Bary Castilla, Forest Gleason, MD (General Surgery) Hollice Espy, MD as Consulting Physician (Urology) Cammie Sickle, MD as Medical Oncologist (Medical Oncology) Isaias Cowman, MD as Consulting Physician (Cardiology)  Cancer Staging No matching staging information was found for the patient.   Oncology History   # MAY- June 2017- METASTATIC CLEAR CELL; LEFT RENAL CA/STAGE IV; [place]- G-3; INTERMEDIATE RISK;  [bil Pul lung nodules;incidental s/p Bx Dr.Byrnett/Dr.Oaks] s/p Cytoreductive nephrectomy; Dr.Brandon- [id number]; July 11th 2017 CT- Enlarging Lung nodules  # Aug 7th 2017- PAZOPANIB; STOP SEP 2017 [pancreatitis/poor tol]  # OCT 1 week- START NIVO q 2W x4; DEC 8th CT- "Progression"; Continue Opdivo;APRIL-MAY 2018- STABLE LUNG NODULES [stopped sec to severe cough; NO Pneumonitis]  # May 31st 2018- Cabo 46m/day; July 31st CT lung- improved lung nodules; Sep 1st week- cabo- 470mday [dose reduced sec to mult intol]; Oct 1st 2week-Cabo 2047may;   # NOV 15th 2018- CT chest- slight progression of lung nodules [poor tol to higer doses];   # Nov 28th 2018- SUTENT 50 mg 2w-on  &1 w-OFF;  # MARCH 2019- Progression; start Axitinib 5 mg BID  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  male pleasant patient above history of metastatic renal cell carcinoma  currently on axitinib with a mixed response and the most recent CT scan is here for follow-up.  [facility].  Work-up is in progress.  He does continue complain of dizzy spells.  However no loss of conscious.  No falls.  Complains of back pain joint pain.  Positive for weight loss.  No new shortness of breath.  No new cough.  Review of Systems  Constitutional: Positive for weight loss. Negative for chills, diaphoresis, fever and malaise/fatigue.  HENT: Negative for nosebleeds and sore throat.   Eyes: Negative for double vision.  Respiratory: Negative for cough, hemoptysis, sputum production, shortness of breath and wheezing.   Cardiovascular: Negative for chest pain, palpitations, orthopnea and leg swelling.  Gastrointestinal: Negative for abdominal pain, blood in stool, constipation, diarrhea, heartburn, melena, nausea and vomiting.  Genitourinary: Negative for dysuria, frequency and urgency.  Musculoskeletal: Positive for back pain and joint pain.  Skin: Negative.  Negative for itching and rash.  Neurological: Negative for dizziness, tingling, focal weakness, weakness and headaches.  Endo/Heme/Allergies: Does not bruise/bleed easily.  Psychiatric/Behavioral: Negative for depression. The patient is not nervous/anxious and does not have insomnia.       PAST MEDICAL HISTORY :  Past Medical History:  Diagnosis Date  . CAD (coronary artery disease)   . Cancer ([facility]  left arm  . COPD (ch[place] obstructive pulmonary disease) ([facility] . Hypertension   . Kidney stone   . Lung cancer ([facility] . Melanoma ([facility]2016   right shoulder  . Thyroid nodule 02/02/2016   left BENIGN THYROID NODULE by FNA    PAST SURGICAL HISTORY :  Past Surgical History:  Procedure Laterality Date  . arm surgery Left    arm  . cardiac stents  2011   Angioplasty / Stenting Femoral-X2  . COLONOSCOPY  04/01/12  . CORONARY ANGIOPLASTY     . EXCISION MELANOMA WITH SENTINEL LYMPH NODE BIOPSY Right 01/24/2016   Procedure: EXCISION MELANOMA Right Shoulder;  Surgeon: Robert Bellow, MD;  Location: [facility] ORS;  Service: General;  Laterality: Right;  . LAPAROSCOPIC NEPHRECTOMY, HAND ASSISTED Left 04/24/2016   Procedure: HAND ASSISTED LAPAROSCOPIC NEPHRECTOMY;  Surgeon: Hollice Espy, MD;  Location: [facility] ORS;  Service: Urology;  Laterality: Left;  Marland Kitchen VIDEO ASSISTED THORACOSCOPY (VATS)/THOROCOTOMY Left 03/08/2016   Procedure: VIDEO ASSISTED THORACOSCOPY (VATS) with lung biopsy - Left ;  Surgeon: Robert Bellow, MD;  Location: [facility] ORS;  Service: General;  Laterality: Left;    FAMILY HISTORY :   Family History  Problem Relation Age of Onset  . Hodgkin's lymphoma Daughter 5  . Heart attack Father   . Kidney cancer Neg Hx   . Kidney disease Neg Hx   . Prostate cancer Neg Hx     SOCIAL HISTORY:   Social History   Tobacco Use  . Smoking status: Former Smoker    Packs/day: 2.00    Years: 30.00    Pack years: 60.00    Types: Cigarettes    Start date: 01/18/1956    Last attempt to quit: 01/17/1969    Years since quitting: 49.4  . Smokeless tobacco: Never Used  Substance Use Topics  . Alcohol use: No    Alcohol/week: 0.0 oz    Comment: BEER OCC  . Drug use: No    ALLERGIES:  is allergic to lisinopril and other.  MEDICATIONS:  Current Outpatient Medications  Medication Sig Dispense Refill  . acetaminophen (TYLENOL) 500 MG tablet Take 500 mg by mouth every 6 (six) hours as needed for moderate pain.    Marland Kitchen albuterol (PROVENTIL HFA;VENTOLIN HFA) 108 (90 Base) MCG/ACT inhaler Inhale 2 puffs into the lungs every 6 (six) hours as needed for wheezing or shortness of breath. 1 Inhaler 2  . aspirin EC 81 MG tablet Take 81 mg by mouth daily.    Marland Kitchen atorvastatin (LIPITOR) 40 MG tablet Take 40 mg by mouth daily.    Marland Kitchen axitinib (INLYTA) 5 MG tablet Take 1 tablet (5 mg total) by mouth 2 (two) times daily. (Patient taking differently: Take 5 mg  by mouth daily. ) 60 tablet 4  . busPIRone (BUSPAR) 5 MG tablet Take 2 tablets by mouth daily.    . clopidogrel (PLAVIX) 75 MG tablet Take 75 mg by mouth daily.    Marland Kitchen docusate sodium (COLACE) 100 MG capsule Take 1 capsule (100 mg total) by mouth 2 (two) times daily. 60 capsule 3  . DULoxetine (CYMBALTA) 30 MG capsule Take 2 capsules by mouth daily.    . ferrous sulfate 325 (65 FE) MG EC tablet Take 325 mg by mouth 3 (three) times daily with meals.    . fluticasone (FLONASE) 50 MCG/ACT nasal spray Place 2 sprays into both nostrils daily. 16 g 2  . Fluticasone-Salmeterol (ADVAIR DISKUS) 500-50 MCG/DOSE AEPB Inhale 1 puff into the lungs 2 (two) times daily. 60 each 11  . ipratropium-albuterol (DUONEB) 0.5-2.5 (3) MG/3ML SOLN Take 3 mLs by nebulization every 4 (four) hours as needed. 360 mL 3  . loratadine (CLARITIN) 10 MG tablet Take 10 mg by mouth daily.    Marland Kitchen losartan (COZAAR) 25 MG tablet Take 2 tablets (  50 mg total) by mouth every morning. 60 tablet 3  . MELATONIN PO Take 10 mg by mouth at bedtime.     . Misc Natural Products (GLUCOSAMINE CHOND COMPLEX/MSM) TABS Take 2 tablets by mouth daily.    . montelukast (SINGULAIR) 10 MG tablet Take 1 tablet (10 mg total) by mouth at bedtime. 30 tablet 3  . Omega-3 Fatty Acids (FISH OIL) 1000 MG CPDR Take 1 capsule by mouth daily.    . OXYGEN Inhale 2 L into the lungs at bedtime.    . pyridostigmine (MESTINON) 60 MG tablet Take 1 tablet by mouth 2 (two) times daily.    . tamsulosin (FLOMAX) 0.4 MG CAPS capsule TAKE 1 CAPSULE (0.4 MG TOTAL) BY MOUTH EVERY MORNING. 30 capsule 1  . Tiotropium Bromide Monohydrate (SPIRIVA RESPIMAT) 1.25 MCG/ACT AERS Inhale 1.25 Act into the lungs 2 (two) times daily. 1 Inhaler 11  . amLODipine (NORVASC) 5 MG tablet Take 1 tablet (5 mg total) by mouth 2 (two) times daily. (Patient not taking: Reported on 06/02/2018) 60 tablet 0  . dronabinol (MARINOL) 2.5 MG capsule Take 1 capsule (2.5 mg total) by mouth 2 (two) times daily before  a meal. 60 capsule 1  . metoprolol succinate (TOPROL XL) 25 MG 24 hr tablet Take 1 tablet (25 mg total) by mouth daily. Take it along with 50 of toprol xl once a day. (Patient not taking: Reported on 06/02/2018) 60 tablet 3  . metoprolol succinate (TOPROL-XL) 50 MG 24 hr tablet Take 50 mg by mouth every morning.     . midodrine (PROAMATINE) 2.5 MG tablet Take 1 tablet (2.5 mg total) by mouth 2 (two) times daily with a meal. (Patient not taking: Reported on 06/02/2018) 60 tablet 0  . midodrine (PROAMATINE) 5 MG tablet TAKE 1 TABLET (5 MG TOTAL) BY MOUTH 3 (THREE) TIMES DAILY WITH MEALS. (Patient not taking: Reported on 06/02/2018) 90 tablet 1  . nitroGLYCERIN (NITROSTAT) 0.4 MG SL tablet Place 0.4 mg under the tongue every 5 (five) minutes as needed.      No current facility-administered medications for this visit.     PHYSICAL EXAMINATION: ECOG PERFORMANCE STATUS: 2 - Symptomatic, <50% confined to bed  BP (!) 160/95 (Patient Position: Sitting)   Pulse 76   Temp 97.6 F (36.4 C) (Tympanic)   Resp 20   Ht '6\' 1"'  (1.854 m)   Wt 201 lb (91.2 kg)   BMI 26.52 kg/m   Filed Weights   06/02/18 1031  Weight: 201 lb (91.2 kg)    GENERAL: Well-nourished well-developed; Alert, no distress and comfortable.  Accompanied by family.  EYES: no pallor or icterus OROPHARYNX: no thrush or ulceration; NECK: supple; no lymph nodes felt. LYMPH:  no palpable lymphadenopathy in the axillary or inguinal regions LUNGS: Decreased breath sounds auscultation bilaterally. No wheeze or crackles HEART/CVS: regular rate & rhythm and no murmurs; No lower extremity edema ABDOMEN:abdomen soft, non-tender and normal bowel sounds. No hepatomegaly or splenomegaly.  Musculoskeletal:no cyanosis of digits and no clubbing  PSYCH: alert & oriented x 3 with fluent speech NEURO: no focal motor/sensory deficits SKIN:  no rashes or significant lesions    LABORATORY DATA:  I have reviewed the data as listed    Component  Value Date/Time   NA 137 06/02/2018 1011   NA 146 (H) 05/25/2016 1514   K 3.8 06/02/2018 1011   CL 103 06/02/2018 1011   CO2 26 06/02/2018 1011   GLUCOSE 141 (H) 06/02/2018 1011   BUN  16 06/02/2018 1011   BUN 19 05/25/2016 1514   CREATININE 1.25 (H) 06/02/2018 1011   CALCIUM 9.6 06/02/2018 1011   PROT 7.0 06/02/2018 1011   ALBUMIN 4.0 06/02/2018 1011   AST 29 06/02/2018 1011   ALT 22 06/02/2018 1011   ALKPHOS 104 06/02/2018 1011   BILITOT 1.0 06/02/2018 1011   GFRNONAA 54 (L) 06/02/2018 1011   GFRAA >60 06/02/2018 1011    No results found for: SPEP, UPEP  Lab Results  Component Value Date   WBC 8.9 06/02/2018   NEUTROABS 6.2 06/02/2018   HGB 16.1 06/02/2018   HCT 44.8 06/02/2018   MCV 91.6 06/02/2018   PLT 117 (L) 06/02/2018      Chemistry      Component Value Date/Time   NA 137 06/02/2018 1011   NA 146 (H) 05/25/2016 1514   K 3.8 06/02/2018 1011   CL 103 06/02/2018 1011   CO2 26 06/02/2018 1011   BUN 16 06/02/2018 1011   BUN 19 05/25/2016 1514   CREATININE 1.25 (H) 06/02/2018 1011      Component Value Date/Time   CALCIUM 9.6 06/02/2018 1011   ALKPHOS 104 06/02/2018 1011   AST 29 06/02/2018 1011   ALT 22 06/02/2018 1011   BILITOT 1.0 06/02/2018 1011       RADIOGRAPHIC STUDIES: I have personally reviewed the radiological images as listed and agreed with the findings in the report. No results found.   ASSESSMENT & PLAN:  Cancer of left kidney excluding renal pelvis ([place])  # Left kidney cancer metastatic to the lung-currently on axitinib 10 mg once a day; June 2019 CT scan shows stable to improved lung lesions; however multiple lytic lesions in the bones.- MIXED RESPONSE.  #Given the patient's extreme difficulty with multiple medication adverse events-recommend continued axitinib 5 mg twice daily at this time.  #Multiple lytic lesions in the bones-Xgeva today; Recommend calcium and vitamin D.  Worsening back pain check lumbar x-rays.  #Poorly controlled  blood pressure/labile blood pressures- 150/ 90s; with significant orthostasis; status post evaluation with Dr.Mobarek.  As per recommendation will order urine/ serum electrophoresis; B12 folate;  # Will order Omniseq for molecular testing.   # x-geva shot today; follow-up in 3 weeks- MD/ no labs.    Orders Placed This Encounter  Procedures  . DG Lumbar Spine 2-3 Views    Standing Status:   Future    Number of Occurrences:   1    Standing Expiration Date:   08/04/2019    Order Specific Question:   Reason for Exam (SYMPTOM  OR DIAGNOSIS REQUIRED)    Answer:   back pain    Order Specific Question:   Preferred imaging location?    Answer:   [facility]    Order Specific Question:   Radiology Contrast Protocol - do NOT remove file path    Answer:   [email or web address]  . UPEP/TP, 24-Hr Urine  . Multiple Myeloma Panel (SPEP&IFE w/QIG)    Standing Status:   Future    Number of Occurrences:   1    Standing Expiration Date:   07/07/2019  . Methylmalonic acid, serum    Standing Status:   Future    Number of Occurrences:   1    Standing Expiration Date:   07/07/2019  . Vitamin B12    Standing Status:   Future    Number of Occurrences:   1    Standing Expiration Date:   07/07/2019  . Folate  Standing Status:   Future    Number of Occurrences:   1    Standing Expiration Date:   07/07/2019   All questions were answered. The patient knows to call the clinic with any problems, questions or concerns.      Cammie Sickle, MD 06/04/2018 10:18 PM

## 2018-06-03 DIAGNOSIS — C642 Malignant neoplasm of left kidney, except renal pelvis: Secondary | ICD-10-CM | POA: Diagnosis not present

## 2018-06-04 ENCOUNTER — Telehealth: Payer: Self-pay | Admitting: Internal Medicine

## 2018-06-04 LAB — MULTIPLE MYELOMA PANEL, SERUM
ALBUMIN/GLOB SERPL: 1.4 (ref 0.7–1.7)
Albumin SerPl Elph-Mcnc: 4.3 g/dL (ref 2.9–4.4)
Alpha 1: 0.2 g/dL (ref 0.0–0.4)
Alpha2 Glob SerPl Elph-Mcnc: 0.7 g/dL (ref 0.4–1.0)
B-GLOBULIN SERPL ELPH-MCNC: 1.5 g/dL — AB (ref 0.7–1.3)
GAMMA GLOB SERPL ELPH-MCNC: 0.7 g/dL (ref 0.4–1.8)
GLOBULIN, TOTAL: 3.1 g/dL (ref 2.2–3.9)
IGA: 407 mg/dL (ref 61–437)
IgG (Immunoglobin G), Serum: 834 mg/dL (ref 700–1600)
IgM (Immunoglobulin M), Srm: 180 mg/dL — ABNORMAL HIGH (ref 15–143)
Total Protein ELP: 7.4 g/dL (ref 6.0–8.5)

## 2018-06-04 LAB — METHYLMALONIC ACID, SERUM: METHYLMALONIC ACID, QUANTITATIVE: 536 nmol/L — AB (ref 0–378)

## 2018-06-04 NOTE — Telephone Encounter (Signed)
Heather/brooke- Please order omniseq on pt path- Surgical Pathology - CASE: [id number]  [April 2017].   Thx, GB

## 2018-06-05 ENCOUNTER — Other Ambulatory Visit: Payer: Self-pay | Admitting: Internal Medicine

## 2018-06-05 ENCOUNTER — Telehealth: Payer: Self-pay | Admitting: *Deleted

## 2018-06-05 DIAGNOSIS — C642 Malignant neoplasm of left kidney, except renal pelvis: Secondary | ICD-10-CM

## 2018-06-05 DIAGNOSIS — C7951 Secondary malignant neoplasm of bone: Secondary | ICD-10-CM

## 2018-06-05 LAB — UPEP/TP, 24-HR URINE
Albumin, U: 100 %
Alpha 1, Urine: 0 %
Alpha 2, Urine: 0 %
Beta, Urine: 0 %
Gamma Globulin, Urine: 0 %
TOTAL VOLUME: 2900

## 2018-06-05 MED ORDER — HYDROCODONE-ACETAMINOPHEN 5-325 MG PO TABS: 1.0000 | ORAL_TABLET | Freq: Three times a day (TID) | ORAL | 0 refills | Status: DC | PRN

## 2018-06-05 NOTE — Progress Notes (Signed)
Spoke with patient's wife. Wife gave verbal understanding of the plan of care. agreeable for the pet scan. Information provided on norco side effects. Cautioned pt in standing up to avoid drop in orthostatic vitals. Discussed constipation interventions.

## 2018-06-05 NOTE — Telephone Encounter (Signed)
Wife called. Would like to know lumbar xray. Results reviewed with patient's wife. Xray demonstrates degenerative changes in spine. Wife would like to know if Dr. B would be willing to prescribe pain medication for her husband to help with the pain. Pt is not sleeping due to the pain and discomfort per wife. Pt currently taking otc tylenol, but this is not relieving his pain.

## 2018-06-05 NOTE — Telephone Encounter (Signed)
See MD phone note for md response.

## 2018-06-05 NOTE — Progress Notes (Signed)
PET scan asap; hydrocodone ordered;   Please inform/schedule.

## 2018-06-08 ENCOUNTER — Other Ambulatory Visit: Payer: Self-pay | Admitting: Internal Medicine

## 2018-06-11 ENCOUNTER — Encounter
Admission: RE | Admit: 2018-06-11 | Discharge: 2018-06-11 | Disposition: A | Discharge status: Home / Self Care | Payer: PPO | Source: Ambulatory Visit | Attending: Internal Medicine | Admitting: Internal Medicine

## 2018-06-11 DIAGNOSIS — C7951 Secondary malignant neoplasm of bone: Secondary | ICD-10-CM | POA: Insufficient documentation

## 2018-06-11 DIAGNOSIS — C649 Malignant neoplasm of unspecified kidney, except renal pelvis: Secondary | ICD-10-CM | POA: Diagnosis not present

## 2018-06-11 DIAGNOSIS — C642 Malignant neoplasm of left kidney, except renal pelvis: Secondary | ICD-10-CM | POA: Diagnosis not present

## 2018-06-11 LAB — GLUCOSE, CAPILLARY: GLUCOSE-CAPILLARY: 119 mg/dL — AB (ref 70–99)

## 2018-06-11 MED ORDER — FLUDEOXYGLUCOSE F - 18 (FDG) INJECTION
10.8200 | Freq: Once | INTRAVENOUS | Status: AC | PRN
  Administered 2018-06-11: 10.82 via INTRAVENOUS

## 2018-06-11 NOTE — Telephone Encounter (Signed)
Faxed to [facility]

## 2018-06-12 ENCOUNTER — Telehealth: Payer: Self-pay | Admitting: Internal Medicine

## 2018-06-12 DIAGNOSIS — C642 Malignant neoplasm of left kidney, except renal pelvis: Secondary | ICD-10-CM | POA: Diagnosis not present

## 2018-06-12 NOTE — Telephone Encounter (Signed)
Spoke to pt's wife re: results of PET scan; recommend radiation to the left hip-palliative basis. Spoke to [employer]; kindly agrees to evaluate the pt asap.  Heather-please inform patient's wife of above; also have pt see Dr.Crystal asap.   Thx GB

## 2018-06-12 NOTE — Telephone Encounter (Signed)
Patient has been scheduled to see Dr. Baruch Gouty on Monday 06/16/18 at 1:00.  I contacted patient's wife and notified her of this appt time. Patient's wife confirmed appt with me and verbalized understanding. Thanks!

## 2018-06-12 NOTE — Telephone Encounter (Signed)
Wife informed of apts with radiation therapy.

## 2018-06-14 ENCOUNTER — Telehealth: Payer: Self-pay | Admitting: Internal Medicine

## 2018-06-14 NOTE — Telephone Encounter (Signed)
Please fax a copy of patient's labs-as recommended by Dr.Sam; [facility]. Thx

## 2018-06-16 ENCOUNTER — Encounter: Payer: Self-pay | Admitting: Radiation Oncology

## 2018-06-16 ENCOUNTER — Telehealth: Payer: Self-pay | Admitting: *Deleted

## 2018-06-16 ENCOUNTER — Ambulatory Visit
Admission: RE | Admit: 2018-06-16 | Discharge: 2018-06-16 | Disposition: A | Discharge status: Home / Self Care | Payer: PPO | Source: Ambulatory Visit | Attending: Radiation Oncology | Admitting: Radiation Oncology

## 2018-06-16 ENCOUNTER — Other Ambulatory Visit: Payer: Self-pay

## 2018-06-16 VITALS — BP 139/92 | HR 88 | Temp 97.5°F | Resp 18 | Wt 201.6 lb

## 2018-06-16 DIAGNOSIS — Z85820 Personal history of malignant melanoma of skin: Secondary | ICD-10-CM | POA: Diagnosis not present

## 2018-06-16 DIAGNOSIS — Z87442 Personal history of urinary calculi: Secondary | ICD-10-CM | POA: Insufficient documentation

## 2018-06-16 DIAGNOSIS — Z79899 Other long term (current) drug therapy: Secondary | ICD-10-CM | POA: Insufficient documentation

## 2018-06-16 DIAGNOSIS — Z85118 Personal history of other malignant neoplasm of bronchus and lung: Secondary | ICD-10-CM | POA: Insufficient documentation

## 2018-06-16 DIAGNOSIS — J449 Chronic obstructive pulmonary disease, unspecified: Secondary | ICD-10-CM | POA: Diagnosis not present

## 2018-06-16 DIAGNOSIS — E041 Nontoxic single thyroid nodule: Secondary | ICD-10-CM | POA: Insufficient documentation

## 2018-06-16 DIAGNOSIS — Z87891 Personal history of nicotine dependence: Secondary | ICD-10-CM | POA: Insufficient documentation

## 2018-06-16 DIAGNOSIS — C7951 Secondary malignant neoplasm of bone: Secondary | ICD-10-CM | POA: Insufficient documentation

## 2018-06-16 DIAGNOSIS — I2510 Atherosclerotic heart disease of native coronary artery without angina pectoris: Secondary | ICD-10-CM | POA: Diagnosis not present

## 2018-06-16 DIAGNOSIS — C642 Malignant neoplasm of left kidney, except renal pelvis: Secondary | ICD-10-CM | POA: Diagnosis not present

## 2018-06-16 DIAGNOSIS — I10 Essential (primary) hypertension: Secondary | ICD-10-CM | POA: Diagnosis not present

## 2018-06-16 DIAGNOSIS — Z905 Acquired absence of kidney: Secondary | ICD-10-CM | POA: Insufficient documentation

## 2018-06-16 DIAGNOSIS — Z7982 Long term (current) use of aspirin: Secondary | ICD-10-CM | POA: Insufficient documentation

## 2018-06-16 NOTE — Telephone Encounter (Signed)
Pa SUBMITTED via covermymeds - [id number] - PA Case ID: [id number] - Rx #: [id number]  for Marinol. Pending insurance approval.

## 2018-06-16 NOTE — Telephone Encounter (Signed)
Labs faxed to [facility] - Dr. Margarito Courser

## 2018-06-16 NOTE — Progress Notes (Signed)
NEW PATIENT EVALUATION  Name: Glen Davis  MRN: [id number]  Date:   06/16/2018     DOB: Oct 08, 1941   This 77 y.o. 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  REFERRING PHYSICIAN: Dion Body, MD  CHIEF COMPLAINT:  Chief Complaint  Patient presents with  . Cancer    Pt is here for initial consultation of bone metastasis    DIAGNOSIS: The encounter diagnosis was Cancer, metastatic to bone [facility]).   PREVIOUS INVESTIGATIONS:  Clinical notes reviewed PET CT scan reviewed   HPI: patient is a 77 year old male diagnosed in June 2017 with stage IV left renal carcinoma. He had a cytoreductive nephrectomy he has known lung involvement. He has been on multiple immunotherapy interventions including  PAZOPANIB; as well as Nivo.he is also been on'sstutentas well asAxitinib. Recently he is complaining of lower extremity pain mostly center around his left hip. PET CT scan demonstrated hypermetabolic metastatic involvement of the left iliac wing as well as the L4 vertebral body. He is referred to radiation oncology today for consideration of palliative treatment. He is ambulating well. He specifically denies any focal neurologic levels.   PLANNED TREATMENT REGIMEN: palliative radiation therapy to L4 and left iliac wing  PAST MEDICAL HISTORY:  has a past medical history of CAD (coronary artery disease), Cancer ([place]), COPD (chronic obstructive pulmonary disease) ([place]), Hypertension, Kidney stone, Lung cancer ([place]), Melanoma ([place]) (01/24/2016), and Thyroid nodule (02/02/2016).    PAST SURGICAL HISTORY:  Past Surgical History:  Procedure Laterality Date  . arm surgery Left    arm  . cardiac stents  2011   Angioplasty / Stenting Femoral-X2  . COLONOSCOPY  04/01/12  . CORONARY ANGIOPLASTY    . EXCISION MELANOMA WITH SENTINEL LYMPH NODE BIOPSY Right 01/24/2016   Procedure: EXCISION MELANOMA Right  Shoulder;  Surgeon: Robert Bellow, MD;  Location: [facility] ORS;  Service: General;  Laterality: Right;  . LAPAROSCOPIC NEPHRECTOMY, HAND ASSISTED Left 04/24/2016   Procedure: HAND ASSISTED LAPAROSCOPIC NEPHRECTOMY;  Surgeon: Hollice Espy, MD;  Location: [facility] ORS;  Service: Urology;  Laterality: Left;  Marland Kitchen VIDEO ASSISTED THORACOSCOPY (VATS)/THOROCOTOMY Left 03/08/2016   Procedure: VIDEO ASSISTED THORACOSCOPY (VATS) with lung biopsy - Left ;  Surgeon: Robert Bellow, MD;  Location: [facility] ORS;  Service: General;  Laterality: Left;    FAMILY HISTORY: family history includes Heart attack in his father; Hodgkin's lymphoma (age of onset: 66) in his daughter.  SOCIAL HISTORY:  reports that he quit smoking about 49 years ago. His smoking use included cigarettes. He started smoking about 62 years ago. He has a 60.00 pack-year smoking history. He has never used smokeless tobacco. He reports that he does not drink alcohol or use drugs.  ALLERGIES: Lisinopril and Other  MEDICATIONS:  Current Outpatient Medications  Medication Sig Dispense Refill  . acetaminophen (TYLENOL) 500 MG tablet Take 500 mg by mouth every 6 (six) hours as needed for moderate pain.    Marland Kitchen albuterol (PROVENTIL HFA;VENTOLIN HFA) 108 (90 Base) MCG/ACT inhaler Inhale 2 puffs into the lungs every 6 (six) hours as needed for wheezing or shortness of breath. 1 Inhaler 2  . aspirin EC 81 MG tablet Take 81 mg by mouth daily.    Marland Kitchen atorvastatin (LIPITOR) 40 MG tablet Take 40 mg by mouth daily.    Marland Kitchen axitinib (INLYTA) 5 MG tablet Take 1 tablet (5 mg total) by mouth 2 (two) times daily. (  Patient taking differently: Take 5 mg by mouth daily. ) 60 tablet 4  . busPIRone (BUSPAR) 5 MG tablet Take 2 tablets by mouth daily.    . clopidogrel (PLAVIX) 75 MG tablet Take 75 mg by mouth daily.    Marland Kitchen docusate sodium (COLACE) 100 MG capsule Take 1 capsule (100 mg total) by mouth 2 (two) times daily. 60 capsule 3  . dronabinol (MARINOL) 2.5 MG capsule Take 1  capsule (2.5 mg total) by mouth 2 (two) times daily before a meal. 60 capsule 1  . DULoxetine (CYMBALTA) 30 MG capsule Take 2 capsules by mouth daily.    . ferrous sulfate 325 (65 FE) MG EC tablet Take 325 mg by mouth 3 (three) times daily with meals.    . fluticasone (FLONASE) 50 MCG/ACT nasal spray Place 2 sprays into both nostrils daily. 16 g 2  . Fluticasone-Salmeterol (ADVAIR DISKUS) 500-50 MCG/DOSE AEPB Inhale 1 puff into the lungs 2 (two) times daily. 60 each 11  . HYDROcodone-acetaminophen (NORCO/VICODIN) 5-325 MG tablet Take 1 tablet by mouth 3 (three) times daily as needed for moderate pain. 60 tablet 0  . ipratropium-albuterol (DUONEB) 0.5-2.5 (3) MG/3ML SOLN Take 3 mLs by nebulization every 4 (four) hours as needed. 360 mL 3  . loratadine (CLARITIN) 10 MG tablet Take 10 mg by mouth daily.    Marland Kitchen losartan (COZAAR) 25 MG tablet Take 2 tablets (50 mg total) by mouth every morning. 60 tablet 3  . MELATONIN PO Take 10 mg by mouth at bedtime.     . metoprolol succinate (TOPROL-XL) 50 MG 24 hr tablet Take 50 mg by mouth every morning.     . Misc Natural Products (GLUCOSAMINE CHOND COMPLEX/MSM) TABS Take 2 tablets by mouth daily.    . montelukast (SINGULAIR) 10 MG tablet Take 1 tablet (10 mg total) by mouth at bedtime. 30 tablet 3  . nitroGLYCERIN (NITROSTAT) 0.4 MG SL tablet Place 0.4 mg under the tongue every 5 (five) minutes as needed.     . Omega-3 Fatty Acids (FISH OIL) 1000 MG CPDR Take 1 capsule by mouth daily.    . OXYGEN Inhale 2 L into the lungs at bedtime.    . pyridostigmine (MESTINON) 60 MG tablet Take 1 tablet by mouth 2 (two) times daily.    . tamsulosin (FLOMAX) 0.4 MG CAPS capsule TAKE 1 CAPSULE (0.4 MG TOTAL) BY MOUTH EVERY MORNING. 30 capsule 1  . Tiotropium Bromide Monohydrate (SPIRIVA RESPIMAT) 1.25 MCG/ACT AERS Inhale 1.25 Act into the lungs 2 (two) times daily. 1 Inhaler 11  . amLODipine (NORVASC) 5 MG tablet Take 1 tablet (5 mg total) by mouth 2 (two) times daily.  (Patient not taking: Reported on 06/02/2018) 60 tablet 0  . metoprolol succinate (TOPROL XL) 25 MG 24 hr tablet Take 1 tablet (25 mg total) by mouth daily. Take it along with 50 of toprol xl once a day. (Patient not taking: Reported on 06/02/2018) 60 tablet 3  . midodrine (PROAMATINE) 2.5 MG tablet Take 1 tablet (2.5 mg total) by mouth 2 (two) times daily with a meal. (Patient not taking: Reported on 06/02/2018) 60 tablet 0  . midodrine (PROAMATINE) 5 MG tablet TAKE 1 TABLET (5 MG TOTAL) BY MOUTH 3 (THREE) TIMES DAILY WITH MEALS. (Patient not taking: Reported on 06/02/2018) 90 tablet 1   No current facility-administered medications for this encounter.     ECOG PERFORMANCE STATUS:  1 - Symptomatic but completely ambulatory  REVIEW OF SYSTEMS: except for the extremity pain Patient  denies any weight loss, fatigue, weakness, fever, chills or night sweats. Patient denies any loss of vision, blurred vision. Patient denies any ringing  of the ears or hearing loss. No irregular heartbeat. Patient denies heart murmur or history of fainting. Patient denies any chest pain or pain radiating to her upper extremities. Patient denies any shortness of breath, difficulty breathing at night, cough or hemoptysis. Patient denies any swelling in the lower legs. Patient denies any nausea vomiting, vomiting of blood, or coffee ground material in the vomitus. Patient denies any stomach pain. Patient states has had normal bowel movements no significant constipation or diarrhea. Patient denies any dysuria, hematuria or significant nocturia. Patient denies any problems walking, swelling in the joints or loss of balance. Patient denies any skin changes, loss of hair or loss of weight. Patient denies any excessive worrying or anxiety or significant depression. Patient denies any problems with insomnia. Patient denies excessive thirst, polyuria, polydipsia. Patient denies any swollen glands, patient denies easy bruising or easy bleeding.  Patient denies any recent infections, allergies or URI. Patient "s visual fields have not changed significantly in recent time.    PHYSICAL EXAM: BP (!) 139/92   Pulse 88   Temp (!) 97.5 F (36.4 C)   Resp 18   Wt 201 lb 9.8 oz (91.4 kg)   BMI 26.60 kg/m  Range of motion of his left lower extremity does not elicit pain. He does have some slight decreased strength in his left lower extremity. Deep palpation of his lumbar spine does not elicit pain.Well-developed well-nourished patient in NAD. HEENT reveals PERLA, EOMI, discs not visualized.  Oral cavity is clear. No oral mucosal lesions are identified. Neck is clear without evidence of cervical or supraclavicular adenopathy. Lungs are clear to A&P. Cardiac examination is essentially unremarkable with regular rate and rhythm without murmur rub or thrill. Abdomen is benign with no organomegaly or masses noted. Motor sensory and DTR levels are equal and symmetric in the upper and lower extremities. Cranial nerves II through XII are grossly intact. Proprioception is intact. No peripheral adenopathy or edema is identified. No motor or sensory levels are noted. Crude visual fields are within normal range.  LABORATORY DATA: pathology reports are reviewed    RADIOLOGY RESULTS:PET CT scan reviewed and compatible with the above-stated findings   IMPRESSION: progressive bone metastasis in patient with known stage IV renal cell carcinoma for palliative radiation therapy  PLAN: this time I to go ahead with a course of 3000 cGy in 10 fractions to the left iliac wing as well as L4 vertebral body. Risks and benefits of treatment including skin reaction fatigue alteration of blood counts possible diarrhea possible increased lower urinary tract symptoms all were discussed in detail with the patient. He seems to comprehend my treatment plan well. I've personally set up and ordered CT simulation for tomorrow.  I would like to take this opportunity to thank you  for allowing me to participate in the care of your patient.Noreene Filbert, MD

## 2018-06-17 ENCOUNTER — Ambulatory Visit
Admission: RE | Admit: 2018-06-17 | Discharge: 2018-06-17 | Disposition: A | Discharge status: Home / Self Care | Payer: PPO | Source: Ambulatory Visit | Attending: Radiation Oncology | Admitting: Radiation Oncology

## 2018-06-17 DIAGNOSIS — Z510 Encounter for antineoplastic radiation therapy: Secondary | ICD-10-CM | POA: Diagnosis not present

## 2018-06-17 DIAGNOSIS — C7951 Secondary malignant neoplasm of bone: Secondary | ICD-10-CM | POA: Diagnosis not present

## 2018-06-17 DIAGNOSIS — C642 Malignant neoplasm of left kidney, except renal pelvis: Secondary | ICD-10-CM | POA: Insufficient documentation

## 2018-06-17 NOTE — Telephone Encounter (Signed)
Reviewed covermymeds- rx still pending review for PA

## 2018-06-18 DIAGNOSIS — C7951 Secondary malignant neoplasm of bone: Secondary | ICD-10-CM | POA: Diagnosis not present

## 2018-06-18 DIAGNOSIS — C642 Malignant neoplasm of left kidney, except renal pelvis: Secondary | ICD-10-CM | POA: Diagnosis not present

## 2018-06-18 DIAGNOSIS — Z510 Encounter for antineoplastic radiation therapy: Secondary | ICD-10-CM | POA: Diagnosis not present

## 2018-06-18 NOTE — Telephone Encounter (Signed)
Insurance pending review of marinol per cover my meds

## 2018-06-19 DIAGNOSIS — C642 Malignant neoplasm of left kidney, except renal pelvis: Secondary | ICD-10-CM | POA: Diagnosis not present

## 2018-06-19 DIAGNOSIS — R05 Cough: Secondary | ICD-10-CM | POA: Diagnosis not present

## 2018-06-19 DIAGNOSIS — R0602 Shortness of breath: Secondary | ICD-10-CM | POA: Diagnosis not present

## 2018-06-19 DIAGNOSIS — J449 Chronic obstructive pulmonary disease, unspecified: Secondary | ICD-10-CM | POA: Diagnosis not present

## 2018-06-20 ENCOUNTER — Other Ambulatory Visit: Payer: Self-pay | Admitting: *Deleted

## 2018-06-20 ENCOUNTER — Other Ambulatory Visit: Payer: Self-pay | Admitting: Internal Medicine

## 2018-06-20 DIAGNOSIS — C642 Malignant neoplasm of left kidney, except renal pelvis: Secondary | ICD-10-CM

## 2018-06-20 DIAGNOSIS — C7951 Secondary malignant neoplasm of bone: Secondary | ICD-10-CM

## 2018-06-20 NOTE — Telephone Encounter (Signed)
Per note from [facility]- marinol has been approved

## 2018-06-23 ENCOUNTER — Telehealth: Payer: Self-pay | Admitting: Pharmacist

## 2018-06-23 ENCOUNTER — Other Ambulatory Visit: Payer: Self-pay

## 2018-06-23 ENCOUNTER — Ambulatory Visit
Admission: RE | Admit: 2018-06-23 | Discharge: 2018-06-23 | Disposition: A | Discharge status: Home / Self Care | Payer: PPO | Source: Ambulatory Visit | Attending: Radiation Oncology | Admitting: Radiation Oncology

## 2018-06-23 ENCOUNTER — Inpatient Hospital Stay: Payer: PPO | Attending: Internal Medicine | Admitting: Internal Medicine

## 2018-06-23 VITALS — BP 117/78 | HR 67 | Temp 97.6°F | Resp 20

## 2018-06-23 DIAGNOSIS — R634 Abnormal weight loss: Secondary | ICD-10-CM | POA: Diagnosis not present

## 2018-06-23 DIAGNOSIS — Z510 Encounter for antineoplastic radiation therapy: Secondary | ICD-10-CM | POA: Insufficient documentation

## 2018-06-23 DIAGNOSIS — Z79899 Other long term (current) drug therapy: Secondary | ICD-10-CM | POA: Insufficient documentation

## 2018-06-23 DIAGNOSIS — C7951 Secondary malignant neoplasm of bone: Secondary | ICD-10-CM | POA: Diagnosis not present

## 2018-06-23 DIAGNOSIS — R109 Unspecified abdominal pain: Secondary | ICD-10-CM | POA: Insufficient documentation

## 2018-06-23 DIAGNOSIS — N183 Chronic kidney disease, stage 3 (moderate): Secondary | ICD-10-CM | POA: Diagnosis not present

## 2018-06-23 DIAGNOSIS — R42 Dizziness and giddiness: Secondary | ICD-10-CM

## 2018-06-23 DIAGNOSIS — C642 Malignant neoplasm of left kidney, except renal pelvis: Secondary | ICD-10-CM | POA: Insufficient documentation

## 2018-06-23 DIAGNOSIS — Z7982 Long term (current) use of aspirin: Secondary | ICD-10-CM | POA: Diagnosis not present

## 2018-06-23 DIAGNOSIS — J449 Chronic obstructive pulmonary disease, unspecified: Secondary | ICD-10-CM | POA: Insufficient documentation

## 2018-06-23 DIAGNOSIS — E079 Disorder of thyroid, unspecified: Secondary | ICD-10-CM

## 2018-06-23 DIAGNOSIS — Z87891 Personal history of nicotine dependence: Secondary | ICD-10-CM | POA: Insufficient documentation

## 2018-06-23 DIAGNOSIS — I951 Orthostatic hypotension: Secondary | ICD-10-CM | POA: Insufficient documentation

## 2018-06-23 DIAGNOSIS — Z87442 Personal history of urinary calculi: Secondary | ICD-10-CM

## 2018-06-23 DIAGNOSIS — I2510 Atherosclerotic heart disease of native coronary artery without angina pectoris: Secondary | ICD-10-CM | POA: Diagnosis not present

## 2018-06-23 DIAGNOSIS — I10 Essential (primary) hypertension: Secondary | ICD-10-CM

## 2018-06-23 DIAGNOSIS — Z85820 Personal history of malignant melanoma of skin: Secondary | ICD-10-CM | POA: Insufficient documentation

## 2018-06-23 DIAGNOSIS — I129 Hypertensive chronic kidney disease with stage 1 through stage 4 chronic kidney disease, or unspecified chronic kidney disease: Secondary | ICD-10-CM | POA: Diagnosis not present

## 2018-06-23 DIAGNOSIS — R197 Diarrhea, unspecified: Secondary | ICD-10-CM | POA: Diagnosis not present

## 2018-06-23 MED ORDER — HYDROCODONE-ACETAMINOPHEN 5-325 MG PO TABS: 1.0000 | ORAL_TABLET | Freq: Three times a day (TID) | ORAL | 0 refills | Status: DC | PRN

## 2018-06-23 MED ORDER — CABOZANTINIB S-MALATE 40 MG PO TABS: 40.0000 mg | ORAL_TABLET | Freq: Every day | ORAL | 4 refills | Status: DC

## 2018-06-23 NOTE — Assessment & Plan Note (Addendum)
#   Left kidney cancer metastatic to the lung-currently on axitinib 10 mg once a day; June 2019 CT scan shows stable to improved lung lesions; however multiple lytic lesions in the bones.- MIXED RESPONSE.  #Given the mixed response to axitinib/tolerance to therapy-recommend stopping axitinib at this time.  Recommend starting patient on cabo 40 mg once a day especially given the bone lesions.   #Multiple lytic lesions in the bones-on Xgeva.  Worsen.  #Left hip pain-metastatic disease to the left hip.  Status post radiation evaluation awaiting starting radiation tomorrow.  #Orthostatic hypotension-clinically stable.  As per cardiology.  # follow up in 3 weeks/labs- x-geva.

## 2018-06-23 NOTE — Progress Notes (Signed)
[place] OFFICE PROGRESS NOTE  Patient Care Team: Dion Body, MD as PCP - General (Family Medicine) Jannet Mantis, MD (Dermatology) Bary Castilla, Forest Gleason, MD (General Surgery) Hollice Espy, MD as Consulting Physician (Urology) Cammie Sickle, MD as Medical Oncologist (Medical Oncology) Isaias Cowman, MD as Consulting Physician (Cardiology) Harriette Bouillon, MD as Referring Physician (Cardiology)  Cancer Staging No matching staging information was found for the patient.   Oncology History   # MAY- June 2017- METASTATIC CLEAR CELL; LEFT RENAL CA/STAGE IV; [place]- G-3; INTERMEDIATE RISK;  [bil Pul lung nodules;incidental s/p Bx Dr.Byrnett/Dr.Oaks] s/p Cytoreductive nephrectomy; Dr.Brandon- [id number]; July 11th 2017 CT- Enlarging Lung nodules  # Aug 7th 2017- PAZOPANIB; STOP SEP 2017 [pancreatitis/poor tol]  # OCT 1 week- START NIVO q 2W x4; DEC 8th CT- "Progression"; Continue Opdivo;APRIL-MAY 2018- STABLE LUNG NODULES [stopped sec to severe cough; NO Pneumonitis]  # May 31st 2018- [place] 60mg /day; July 31st CT lung- improved lung nodules; Sep 1st week- cabo- 40mg /day [dose reduced sec to mult intol]; Oct 1st 2week-Cabo 20mg /day;   # NOV 15th 2018- CT chest- slight progression of lung nodules [poor tol to higer doses];   # Nov 28th 2018- SUTENT 50 mg 2w-on  &1 w-OFF;  # MARCH 2019- Progression; start Axitinib 5 mg BID; July 2019- MIXED response; worsening/new skeletal lesions/stable to improved lung lesions; STOP Axitinib;   # Left hip s/p RT [mid-AUG 2019]  # AUG 12th- RE-START CABO 40 mg/day;   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  male pleasant patient above history of metastatic renal cell carcinoma currently on axitinib with a mixed response on most recent CT scan is here for follow-up.  Patient had a follow-up PET scan-that showed lytic lesion of the left hip; in the interim he has been evaluated by radiation oncology.  He is awaiting starting radiation tomorrow.  Continues to have dizzy spells overall improved.  Poor appetite.  Positive for weight loss.  No falls.  Cough stable.  Review of Systems  Constitutional: Positive for weight loss. Negative for chills, diaphoresis, fever and malaise/fatigue.  HENT: Negative for nosebleeds and sore throat.   Eyes: Negative for double vision.  Respiratory: Negative for cough, hemoptysis, sputum production, shortness of breath and wheezing.   Cardiovascular: Negative for chest pain, palpitations, orthopnea and leg swelling.  Gastrointestinal: Negative for abdominal pain, blood in stool, constipation, diarrhea, heartburn, melena, nausea and vomiting.  Genitourinary: Negative for dysuria, frequency and urgency.  Musculoskeletal: Positive for back pain and joint pain.  Skin: Negative.  Negative for itching and rash.  Neurological: Positive for dizziness. Negative for tingling, focal weakness, weakness and headaches.  Endo/Heme/Allergies: Does not bruise/bleed easily.  Psychiatric/Behavioral: Negative for depression. The patient is not nervous/anxious and does not have insomnia.       PAST MEDICAL HISTORY :  Past Medical History:  Diagnosis Date  . CAD (coronary artery disease)   . Cancer ([place])    left arm  . COPD (chronic obstructive pulmonary disease) ([place])   . Hypertension   . Kidney stone   .  Lung cancer ([place])   . Melanoma ([place]) 01/24/2016   right shoulder  . Thyroid nodule 02/02/2016   left BENIGN THYROID  NODULE by FNA    PAST SURGICAL HISTORY :   Past Surgical History:  Procedure Laterality Date  . arm surgery Left    arm  . cardiac stents  2011   Angioplasty / Stenting Femoral-X2  . COLONOSCOPY  04/01/12  . CORONARY ANGIOPLASTY    . EXCISION MELANOMA WITH SENTINEL LYMPH NODE BIOPSY Right 01/24/2016   Procedure: EXCISION MELANOMA Right Shoulder;  Surgeon: Robert Bellow, MD;  Location: [facility] ORS;  Service: General;  Laterality: Right;  . LAPAROSCOPIC NEPHRECTOMY, HAND ASSISTED Left 04/24/2016   Procedure: HAND ASSISTED LAPAROSCOPIC NEPHRECTOMY;  Surgeon: Hollice Espy, MD;  Location: [facility] ORS;  Service: Urology;  Laterality: Left;  Marland Kitchen VIDEO ASSISTED THORACOSCOPY (VATS)/THOROCOTOMY Left 03/08/2016   Procedure: VIDEO ASSISTED THORACOSCOPY (VATS) with lung biopsy - Left ;  Surgeon: Robert Bellow, MD;  Location: [facility] ORS;  Service: General;  Laterality: Left;    FAMILY HISTORY :   Family History  Problem Relation Age of Onset  . Hodgkin's lymphoma Daughter 5  . Heart attack Father   . Kidney cancer Neg Hx   . Kidney disease Neg Hx   . Prostate cancer Neg Hx     SOCIAL HISTORY:   Social History   Tobacco Use  . Smoking status: Former Smoker    Packs/day: 2.00    Years: 30.00    Pack years: 60.00    Types: Cigarettes    Start date: 01/18/1956    Last attempt to quit: 01/17/1969    Years since quitting: 49.5  . Smokeless tobacco: Never Used  Substance Use Topics  . Alcohol use: No    Alcohol/week: 0.0 standard drinks    Comment: BEER OCC  . Drug use: No    ALLERGIES:  is allergic to lisinopril and other.  MEDICATIONS:  Current Outpatient Medications  Medication Sig Dispense Refill  . acetaminophen (TYLENOL) 500 MG tablet Take 500 mg by mouth every 6 (six) hours as needed for moderate pain.    Marland Kitchen albuterol (PROVENTIL HFA;VENTOLIN HFA) 108 (90 Base) MCG/ACT inhaler Inhale 2 puffs into the lungs every 6 (six) hours as needed for wheezing or shortness of breath. 1 Inhaler 2   . aspirin EC 81 MG tablet Take 81 mg by mouth daily.    Marland Kitchen atorvastatin (LIPITOR) 40 MG tablet Take 40 mg by mouth daily.    . busPIRone (BUSPAR) 5 MG tablet Take 2 tablets by mouth daily.    . clopidogrel (PLAVIX) 75 MG tablet Take 75 mg by mouth daily.    Marland Kitchen docusate sodium (COLACE) 100 MG capsule Take 1 capsule (100 mg total) by mouth 2 (two) times daily. (Patient not taking: Reported on 07/14/2018) 60 capsule 3  . dronabinol (MARINOL) 2.5 MG capsule Take 1 capsule (2.5 mg total) by mouth 2 (two) times daily before a meal. 60 capsule 1  . DULoxetine (CYMBALTA) 30 MG capsule Take 2 capsules by mouth daily.    . ferrous sulfate 325 (65 FE) MG EC tablet Take 325 mg by mouth 3 (three) times daily with meals.    . fluticasone (FLONASE) 50 MCG/ACT nasal spray Place 2 sprays into both nostrils daily. 16 g 2  . Fluticasone-Salmeterol (ADVAIR DISKUS) 500-50 MCG/DOSE AEPB Inhale 1 puff into the lungs 2 (two) times daily. 60 each 11  . ipratropium-albuterol (DUONEB) 0.5-2.5 (3) MG/3ML SOLN Take 3 mLs by  nebulization every 4 (four) hours as needed. 360 mL 3  . loratadine (CLARITIN) 10 MG tablet Take 10 mg by mouth daily.    Marland Kitchen MELATONIN PO Take 10 mg by mouth at bedtime.     . Misc Natural Products (GLUCOSAMINE CHOND COMPLEX/MSM) TABS Take 2 tablets by mouth daily.    . Omega-3 Fatty Acids (FISH OIL) 1000 MG CPDR Take 1 capsule by mouth daily.    . OXYGEN Inhale 2 L into the lungs at bedtime.    . pyridostigmine (MESTINON) 60 MG tablet Take 1 tablet by mouth 2 (two) times daily.    . Tiotropium Bromide Monohydrate (SPIRIVA RESPIMAT) 1.25 MCG/ACT AERS Inhale 1.25 Act into the lungs 2 (two) times daily. 1 Inhaler 11  . amLODipine (NORVASC) 5 MG tablet Take 1 tablet (5 mg total) by mouth 2 (two) times daily. (Patient not taking: Reported on 06/02/2018) 60 tablet 0  . cabozantinib (CABOMETYX) 40 MG tablet Take 1 tablet (40 mg total) by mouth daily. Take on an empty stomach, 1 hour before or 2 hours after meals.  30 tablet 4  . diphenoxylate-atropine (LOMOTIL) 2.5-0.025 MG tablet Take 2 tablets by mouth 4 (four) times daily as needed for diarrhea or loose stools. 30 tablet 0  . loperamide (IMODIUM) 1 MG/5ML solution Take 4 mg by mouth as needed for diarrhea or loose stools.    Marland Kitchen losartan (COZAAR) 25 MG tablet Take 2 tablets (50 mg total) by mouth every morning. (Patient not taking: Reported on 06/23/2018) 60 tablet 3  . metoprolol succinate (TOPROL XL) 25 MG 24 hr tablet Take 1 tablet (25 mg total) by mouth daily. Take it along with 50 of toprol xl once a day. (Patient not taking: Reported on 06/02/2018) 60 tablet 3  . midodrine (PROAMATINE) 2.5 MG tablet Take 1 tablet (2.5 mg total) by mouth 2 (two) times daily with a meal. (Patient not taking: Reported on 06/02/2018) 60 tablet 0  . montelukast (SINGULAIR) 10 MG tablet TAKE 1 TABLET BY MOUTH EVERYDAY AT BEDTIME 30 tablet 3  . nitroGLYCERIN (NITROSTAT) 0.4 MG SL tablet Place 0.4 mg under the tongue every 5 (five) minutes as needed.     Marland Kitchen oxyCODONE (OXY IR/ROXICODONE) 5 MG immediate release tablet Take 1 tablet (5 mg total) by mouth every 4 (four) hours as needed for severe pain. 30 tablet 0  . tamsulosin (FLOMAX) 0.4 MG CAPS capsule TAKE 1 CAPSULE (0.4 MG TOTAL) BY MOUTH EVERY MORNING. 30 capsule 3  . tiZANidine (ZANAFLEX) 4 MG tablet      No current facility-administered medications for this visit.     PHYSICAL EXAMINATION: ECOG PERFORMANCE STATUS: 2 - Symptomatic, <50% confined to bed  BP 117/78   Pulse 67   Temp 97.6 F (36.4 C) (Tympanic)   Resp 20   There were no vitals filed for this visit.  GENERAL: Well-nourished well-developed; Alert, no distress and comfortable.  Accompanied by family.  EYES: no pallor or icterus OROPHARYNX: no thrush or ulceration; NECK: supple; no lymph nodes felt. LYMPH:  no palpable lymphadenopathy in the axillary or inguinal regions LUNGS: Decreased breath sounds auscultation bilaterally. No wheeze or  crackles HEART/CVS: regular rate & rhythm and no murmurs; No lower extremity edema ABDOMEN:abdomen soft, non-tender and normal bowel sounds. No hepatomegaly or splenomegaly.  Musculoskeletal:no cyanosis of digits and no clubbing  PSYCH: alert & oriented x 3 with fluent speech NEURO: no focal motor/sensory deficits SKIN:  no rashes or significant lesions    LABORATORY DATA:  I have reviewed the data as listed    Component Value Date/Time   NA 139 07/14/2018 1008   NA 146 (H) 05/25/2016 1514   K 3.5 07/14/2018 1008   CL 104 07/14/2018 1008   CO2 28 07/14/2018 1008   GLUCOSE 94 07/14/2018 1008   BUN 11 07/14/2018 1008   BUN 19 05/25/2016 1514   CREATININE 1.29 (H) 07/14/2018 1008   CALCIUM 9.4 07/14/2018 1008   PROT 6.4 (L) 07/14/2018 1008   ALBUMIN 3.9 07/14/2018 1008   AST 27 07/14/2018 1008   ALT 30 07/14/2018 1008   ALKPHOS 89 07/14/2018 1008   BILITOT 1.0 07/14/2018 1008   GFRNONAA 52 (L) 07/14/2018 1008   GFRAA >60 07/14/2018 1008    No results found for: SPEP, UPEP  Lab Results  Component Value Date   WBC 7.3 07/14/2018   NEUTROABS 5.0 07/14/2018   HGB 16.0 07/14/2018   HCT 44.9 07/14/2018   MCV 93.6 07/14/2018   PLT 127 (L) 07/14/2018      Chemistry      Component Value Date/Time   NA 139 07/14/2018 1008   NA 146 (H) 05/25/2016 1514   K 3.5 07/14/2018 1008   CL 104 07/14/2018 1008   CO2 28 07/14/2018 1008   BUN 11 07/14/2018 1008   BUN 19 05/25/2016 1514   CREATININE 1.29 (H) 07/14/2018 1008      Component Value Date/Time   CALCIUM 9.4 07/14/2018 1008   ALKPHOS 89 07/14/2018 1008   AST 27 07/14/2018 1008   ALT 30 07/14/2018 1008   BILITOT 1.0 07/14/2018 1008       RADIOGRAPHIC STUDIES: I have personally reviewed the radiological images as listed and agreed with the findings in the report. Dg Abd 2 Views  Result Date: 07/14/2018 CLINICAL DATA:  Abdominal pain.  History metastatic renal carcinoma. EXAM: ABDOMEN - 2 VIEW COMPARISON:  Images  from a PET scan on 06/11/2018 FINDINGS: No evidence of acute bowel obstruction, significant ileus or free intraperitoneal air by x-ray. Clips are present related to prior left nephrectomy. No abnormal calcifications or visible bony abnormalities. IMPRESSION: No acute findings. Electronically Signed   By: Aletta Edouard M.D.   On: 07/14/2018 13:31     ASSESSMENT & PLAN:  Cancer of left kidney excluding renal pelvis (HCC)  # Left kidney cancer metastatic to the lung-currently on axitinib 10 mg once a day; June 2019 CT scan shows stable to improved lung lesions; however multiple lytic lesions in the bones.- MIXED RESPONSE.  #Given the mixed response to axitinib/tolerance to therapy-recommend stopping axitinib at this time.  Recommend starting patient on cabo 40 mg once a day especially given the bone lesions.   #Multiple lytic lesions in the bones-on Xgeva.  Worsen.  #Left hip pain-metastatic disease to the left hip.  Status post radiation evaluation awaiting starting radiation tomorrow.  #Orthostatic hypotension-clinically stable.  As per cardiology.  # follow up in 3 weeks/labs- x-geva.      Orders Placed This Encounter  Procedures  . CBC with Differential    Standing Status:   Future    Number of Occurrences:   1    Standing Expiration Date:   06/24/2019   All questions were answered. The patient knows to call the clinic with any problems, questions or concerns.      Cammie Sickle, MD 07/14/2018 9:24 PM

## 2018-06-23 NOTE — Telephone Encounter (Signed)
Oral Oncology Pharmacist Encounter  Received restart prescription for Cabometyx (cabozantinib) for the treatment of renal cell carcinoma, planned duration until disease progression or unacceptable drug toxicity. He is switching from Inlyta to [facility] which he has been on in the past.   CMP from 06/02/18 assessed, no relevant lab abnormalities. BP from 06/23/18 reviewed, BP well controlled. Prescription dose and frequency assessed.   Current medication list in Epic reviewed, no DDIs with cabozantinib identified.  Prescription has been e-scribed to the [facility] for benefits analysis and approval.  Patient education Counseled patient on administration, dosing, side effects, monitoring, drug-food interactions, safe handling, storage, and disposal. Patient will take 1 tablet (40 mg total) by mouth daily. Take on an empty stomach, 1 hour before or 2 hours after meals.  Side effects include but not limited to: hand-foot syndrome, N/V, decreased wbc, HTN, fatigue, diarrhea.    Reviewed with patient importance of keeping a medication schedule and plan for any missed doses.  Mr. Bogdon voiced understanding and appreciation. All questions answered.  Oral Oncology Clinic will continue to follow for insurance authorization, copayment issues, and start date.  Provided patient with [facility] phone number. Patient knows to call the office with questions or concerns. Oral Chemotherapy Navigation Clinic will continue to follow.  Darl Pikes, PharmD, BCPS, [facility] Hematology/Oncology Clinical Pharmacist [facility] [place] [facility] [phone]  06/23/2018 4:16 PM

## 2018-06-24 ENCOUNTER — Ambulatory Visit
Admission: RE | Admit: 2018-06-24 | Discharge: 2018-06-24 | Disposition: A | Discharge status: Home / Self Care | Payer: PPO | Source: Ambulatory Visit | Attending: Radiation Oncology | Admitting: Radiation Oncology

## 2018-06-24 DIAGNOSIS — Z510 Encounter for antineoplastic radiation therapy: Secondary | ICD-10-CM | POA: Diagnosis not present

## 2018-06-24 MED FILL — CABOMETYX 40 MG TABLET: 40 | 30 days supply | Qty: 30 | Fill #0

## 2018-06-25 ENCOUNTER — Ambulatory Visit
Admission: RE | Admit: 2018-06-25 | Discharge: 2018-06-25 | Disposition: A | Discharge status: Home / Self Care | Payer: PPO | Source: Ambulatory Visit | Attending: Radiation Oncology | Admitting: Radiation Oncology

## 2018-06-25 DIAGNOSIS — Z510 Encounter for antineoplastic radiation therapy: Secondary | ICD-10-CM | POA: Diagnosis not present

## 2018-06-26 ENCOUNTER — Ambulatory Visit
Admission: RE | Admit: 2018-06-26 | Discharge: 2018-06-26 | Disposition: A | Discharge status: Home / Self Care | Payer: PPO | Source: Ambulatory Visit | Attending: Radiation Oncology | Admitting: Radiation Oncology

## 2018-06-26 ENCOUNTER — Other Ambulatory Visit: Payer: Self-pay | Admitting: Internal Medicine

## 2018-06-26 DIAGNOSIS — Z510 Encounter for antineoplastic radiation therapy: Secondary | ICD-10-CM | POA: Diagnosis not present

## 2018-06-27 ENCOUNTER — Ambulatory Visit
Admission: RE | Admit: 2018-06-27 | Discharge: 2018-06-27 | Disposition: A | Discharge status: Home / Self Care | Payer: PPO | Source: Ambulatory Visit | Attending: Radiation Oncology | Admitting: Radiation Oncology

## 2018-06-27 DIAGNOSIS — Z510 Encounter for antineoplastic radiation therapy: Secondary | ICD-10-CM | POA: Diagnosis not present

## 2018-06-30 ENCOUNTER — Ambulatory Visit
Admission: RE | Admit: 2018-06-30 | Discharge: 2018-06-30 | Disposition: A | Discharge status: Home / Self Care | Payer: PPO | Source: Ambulatory Visit | Attending: Radiation Oncology | Admitting: Radiation Oncology

## 2018-06-30 ENCOUNTER — Inpatient Hospital Stay: Payer: PPO

## 2018-06-30 DIAGNOSIS — C7951 Secondary malignant neoplasm of bone: Secondary | ICD-10-CM | POA: Diagnosis not present

## 2018-06-30 DIAGNOSIS — C642 Malignant neoplasm of left kidney, except renal pelvis: Secondary | ICD-10-CM | POA: Diagnosis not present

## 2018-06-30 DIAGNOSIS — Z510 Encounter for antineoplastic radiation therapy: Secondary | ICD-10-CM | POA: Diagnosis not present

## 2018-07-01 ENCOUNTER — Ambulatory Visit
Admission: RE | Admit: 2018-07-01 | Discharge: 2018-07-01 | Disposition: A | Discharge status: Home / Self Care | Payer: PPO | Source: Ambulatory Visit | Attending: Radiation Oncology | Admitting: Radiation Oncology

## 2018-07-01 DIAGNOSIS — Z510 Encounter for antineoplastic radiation therapy: Secondary | ICD-10-CM | POA: Diagnosis not present

## 2018-07-02 ENCOUNTER — Ambulatory Visit
Admission: RE | Admit: 2018-07-02 | Discharge: 2018-07-02 | Disposition: A | Discharge status: Home / Self Care | Payer: PPO | Source: Ambulatory Visit | Attending: Radiation Oncology | Admitting: Radiation Oncology

## 2018-07-02 DIAGNOSIS — Z510 Encounter for antineoplastic radiation therapy: Secondary | ICD-10-CM | POA: Diagnosis not present

## 2018-07-03 ENCOUNTER — Encounter: Payer: Self-pay | Admitting: Internal Medicine

## 2018-07-03 ENCOUNTER — Other Ambulatory Visit: Payer: Self-pay | Admitting: Nurse Practitioner

## 2018-07-03 ENCOUNTER — Ambulatory Visit
Admission: RE | Admit: 2018-07-03 | Discharge: 2018-07-03 | Disposition: A | Discharge status: Home / Self Care | Payer: PPO | Source: Ambulatory Visit | Attending: Radiation Oncology | Admitting: Radiation Oncology

## 2018-07-03 DIAGNOSIS — I10 Essential (primary) hypertension: Secondary | ICD-10-CM | POA: Diagnosis not present

## 2018-07-03 DIAGNOSIS — E781 Pure hyperglyceridemia: Secondary | ICD-10-CM | POA: Diagnosis not present

## 2018-07-03 DIAGNOSIS — Z510 Encounter for antineoplastic radiation therapy: Secondary | ICD-10-CM | POA: Diagnosis not present

## 2018-07-04 ENCOUNTER — Ambulatory Visit
Admission: RE | Admit: 2018-07-04 | Discharge: 2018-07-04 | Disposition: A | Discharge status: Home / Self Care | Payer: PPO | Source: Ambulatory Visit | Attending: Radiation Oncology | Admitting: Radiation Oncology

## 2018-07-04 DIAGNOSIS — Z510 Encounter for antineoplastic radiation therapy: Secondary | ICD-10-CM | POA: Diagnosis not present

## 2018-07-06 ENCOUNTER — Ambulatory Visit: Admission: RE | Admit: 2018-07-06 | Payer: PPO | Source: Ambulatory Visit

## 2018-07-07 ENCOUNTER — Ambulatory Visit
Admission: RE | Admit: 2018-07-07 | Discharge: 2018-07-07 | Disposition: A | Discharge status: Home / Self Care | Payer: PPO | Source: Ambulatory Visit | Attending: Radiation Oncology | Admitting: Radiation Oncology

## 2018-07-07 ENCOUNTER — Ambulatory Visit: Payer: PPO

## 2018-07-07 DIAGNOSIS — C7951 Secondary malignant neoplasm of bone: Secondary | ICD-10-CM | POA: Diagnosis not present

## 2018-07-07 DIAGNOSIS — C642 Malignant neoplasm of left kidney, except renal pelvis: Secondary | ICD-10-CM | POA: Diagnosis not present

## 2018-07-07 DIAGNOSIS — Z510 Encounter for antineoplastic radiation therapy: Secondary | ICD-10-CM | POA: Diagnosis not present

## 2018-07-10 DIAGNOSIS — E781 Pure hyperglyceridemia: Secondary | ICD-10-CM | POA: Diagnosis not present

## 2018-07-10 DIAGNOSIS — I10 Essential (primary) hypertension: Secondary | ICD-10-CM | POA: Diagnosis not present

## 2018-07-10 DIAGNOSIS — C7800 Secondary malignant neoplasm of unspecified lung: Secondary | ICD-10-CM | POA: Diagnosis not present

## 2018-07-10 DIAGNOSIS — C642 Malignant neoplasm of left kidney, except renal pelvis: Secondary | ICD-10-CM | POA: Diagnosis not present

## 2018-07-10 DIAGNOSIS — Z0000 Encounter for general adult medical examination without abnormal findings: Secondary | ICD-10-CM | POA: Diagnosis not present

## 2018-07-14 ENCOUNTER — Encounter: Payer: Self-pay | Admitting: Nurse Practitioner

## 2018-07-14 ENCOUNTER — Other Ambulatory Visit: Payer: Self-pay

## 2018-07-14 ENCOUNTER — Telehealth: Payer: Self-pay | Admitting: *Deleted

## 2018-07-14 ENCOUNTER — Ambulatory Visit
Admission: RE | Admit: 2018-07-14 | Discharge: 2018-07-14 | Disposition: A | Discharge status: Home / Self Care | Payer: PPO | Source: Ambulatory Visit | Attending: Nurse Practitioner | Admitting: Nurse Practitioner

## 2018-07-14 ENCOUNTER — Inpatient Hospital Stay: Payer: PPO

## 2018-07-14 ENCOUNTER — Inpatient Hospital Stay (HOSPITAL_BASED_OUTPATIENT_CLINIC_OR_DEPARTMENT_OTHER): Payer: PPO | Admitting: Nurse Practitioner

## 2018-07-14 VITALS — BP 138/93 | HR 74 | Temp 97.3°F | Resp 18

## 2018-07-14 VITALS — Resp 16

## 2018-07-14 DIAGNOSIS — R197 Diarrhea, unspecified: Secondary | ICD-10-CM

## 2018-07-14 DIAGNOSIS — C642 Malignant neoplasm of left kidney, except renal pelvis: Secondary | ICD-10-CM

## 2018-07-14 DIAGNOSIS — Z87891 Personal history of nicotine dependence: Secondary | ICD-10-CM

## 2018-07-14 DIAGNOSIS — R634 Abnormal weight loss: Secondary | ICD-10-CM

## 2018-07-14 DIAGNOSIS — N183 Chronic kidney disease, stage 3 unspecified: Secondary | ICD-10-CM

## 2018-07-14 DIAGNOSIS — R1010 Upper abdominal pain, unspecified: Secondary | ICD-10-CM

## 2018-07-14 DIAGNOSIS — E079 Disorder of thyroid, unspecified: Secondary | ICD-10-CM | POA: Diagnosis not present

## 2018-07-14 DIAGNOSIS — I129 Hypertensive chronic kidney disease with stage 1 through stage 4 chronic kidney disease, or unspecified chronic kidney disease: Secondary | ICD-10-CM | POA: Diagnosis not present

## 2018-07-14 DIAGNOSIS — R1030 Lower abdominal pain, unspecified: Secondary | ICD-10-CM

## 2018-07-14 DIAGNOSIS — I951 Orthostatic hypotension: Secondary | ICD-10-CM | POA: Diagnosis not present

## 2018-07-14 DIAGNOSIS — J449 Chronic obstructive pulmonary disease, unspecified: Secondary | ICD-10-CM

## 2018-07-14 DIAGNOSIS — R109 Unspecified abdominal pain: Secondary | ICD-10-CM | POA: Diagnosis not present

## 2018-07-14 DIAGNOSIS — R42 Dizziness and giddiness: Secondary | ICD-10-CM | POA: Diagnosis not present

## 2018-07-14 DIAGNOSIS — I2510 Atherosclerotic heart disease of native coronary artery without angina pectoris: Secondary | ICD-10-CM | POA: Diagnosis not present

## 2018-07-14 DIAGNOSIS — Z7982 Long term (current) use of aspirin: Secondary | ICD-10-CM

## 2018-07-14 DIAGNOSIS — Z87442 Personal history of urinary calculi: Secondary | ICD-10-CM

## 2018-07-14 DIAGNOSIS — Z85820 Personal history of malignant melanoma of skin: Secondary | ICD-10-CM

## 2018-07-14 DIAGNOSIS — Z79899 Other long term (current) drug therapy: Secondary | ICD-10-CM

## 2018-07-14 LAB — GASTROINTESTINAL PANEL BY PCR, STOOL (REPLACES STOOL CULTURE)
Adenovirus F40/41: NOT DETECTED
Astrovirus: NOT DETECTED
CAMPYLOBACTER SPECIES: NOT DETECTED
CRYPTOSPORIDIUM: NOT DETECTED
CYCLOSPORA CAYETANENSIS: NOT DETECTED
ENTEROTOXIGENIC E COLI (ETEC): NOT DETECTED
Entamoeba histolytica: NOT DETECTED
Enteroaggregative E coli (EAEC): NOT DETECTED
Enteropathogenic E coli (EPEC): NOT DETECTED
Giardia lamblia: NOT DETECTED
Norovirus GI/GII: NOT DETECTED
PLESIMONAS SHIGELLOIDES: NOT DETECTED
Rotavirus A: NOT DETECTED
SAPOVIRUS (I, II, IV, AND V): NOT DETECTED
SHIGA LIKE TOXIN PRODUCING E COLI (STEC): NOT DETECTED
Salmonella species: NOT DETECTED
Shigella/Enteroinvasive E coli (EIEC): NOT DETECTED
VIBRIO SPECIES: NOT DETECTED
Vibrio cholerae: NOT DETECTED
YERSINIA ENTEROCOLITICA: NOT DETECTED

## 2018-07-14 LAB — COMPREHENSIVE METABOLIC PANEL
ALT: 30 U/L (ref 0–44)
ANION GAP: 7 (ref 5–15)
AST: 27 U/L (ref 15–41)
Albumin: 3.9 g/dL (ref 3.5–5.0)
Alkaline Phosphatase: 89 U/L (ref 38–126)
BUN: 11 mg/dL (ref 8–23)
CHLORIDE: 104 mmol/L (ref 98–111)
CO2: 28 mmol/L (ref 22–32)
Calcium: 9.4 mg/dL (ref 8.9–10.3)
Creatinine, Ser: 1.29 mg/dL — ABNORMAL HIGH (ref 0.61–1.24)
GFR calc non Af Amer: 52 mL/min — ABNORMAL LOW (ref >60)
Glucose, Bld: 94 mg/dL (ref 70–99)
Potassium: 3.5 mmol/L (ref 3.5–5.1)
SODIUM: 139 mmol/L (ref 135–145)
Total Bilirubin: 1 mg/dL (ref 0.3–1.2)
Total Protein: 6.4 g/dL — ABNORMAL LOW (ref 6.5–8.1)

## 2018-07-14 LAB — AMYLASE: AMYLASE: 100 U/L (ref 28–100)

## 2018-07-14 LAB — CBC WITH DIFFERENTIAL/PLATELET
BASOS PCT: 1 %
Basophils Absolute: 0.1 10*3/uL (ref 0–0.1)
EOS ABS: 0.8 10*3/uL — AB (ref 0–0.7)
EOS PCT: 11 %
HCT: 44.9 % (ref 40.0–52.0)
Hemoglobin: 16 g/dL (ref 13.0–18.0)
LYMPHS ABS: 0.9 10*3/uL — AB (ref 1.0–3.6)
Lymphocytes Relative: 12 %
MCH: 33.2 pg (ref 26.0–34.0)
MCHC: 35.5 g/dL (ref 32.0–36.0)
MCV: 93.6 fL (ref 80.0–100.0)
Monocytes Absolute: 0.5 10*3/uL (ref 0.2–1.0)
Monocytes Relative: 7 %
Neutro Abs: 5 10*3/uL (ref 1.4–6.5)
Neutrophils Relative %: 69 %
PLATELETS: 127 10*3/uL — AB (ref 150–440)
RBC: 4.8 MIL/uL (ref 4.40–5.90)
RDW: 16.1 % — ABNORMAL HIGH (ref 11.5–14.5)
WBC: 7.3 10*3/uL (ref 3.8–10.6)

## 2018-07-14 LAB — C DIFFICILE QUICK SCREEN W PCR REFLEX
C DIFFICILE (CDIFF) INTERP: NOT DETECTED
C Diff antigen: NEGATIVE
C Diff toxin: NEGATIVE

## 2018-07-14 LAB — LIPASE, BLOOD: Lipase: 41 U/L (ref 11–51)

## 2018-07-14 LAB — MAGNESIUM: Magnesium: 1.8 mg/dL (ref 1.7–2.4)

## 2018-07-14 MED ORDER — SODIUM CHLORIDE 0.9 % IV SOLN
Freq: Once | INTRAVENOUS | Status: AC
  Administered 2018-07-14: 12:00:00 via INTRAVENOUS
  Filled 2018-07-14: qty 250

## 2018-07-14 MED ORDER — MORPHINE SULFATE 2 MG/ML IJ SOLN
2.0000 mg | Freq: Once | INTRAMUSCULAR | Status: AC
  Administered 2018-07-14: 2 mg via INTRAVENOUS
  Filled 2018-07-14: qty 1

## 2018-07-14 MED ORDER — DIPHENOXYLATE-ATROPINE 2.5-0.025 MG PO TABS: 2.0000 | ORAL_TABLET | Freq: Four times a day (QID) | ORAL | 0 refills | Status: DC | PRN

## 2018-07-14 MED ORDER — OXYCODONE HCL 5 MG PO TABS
5.0000 mg | ORAL_TABLET | ORAL | 0 refills | Status: DC | PRN
Start: 2018-07-14 — End: 2018-07-28

## 2018-07-14 NOTE — Progress Notes (Signed)
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 Rogue Bussing, on 06/23/2018.  Patient with below history of renal cell cancer status post left nephrectomy with known mets to lung.  Patient had multiple intolerances to previous TKIs.  March 2019 CT scan showed progression of lung lesions.  Axitinib was started.  PET July 2019 showed hypermetabolic metastatic involvement of the left iliac wing and L4 vertebral body.  He received palliative radiation to those areas with final treatment on 07/07/2018.  Oncology History   # MAY- June 2017- METASTATIC CLEAR CELL; LEFT RENAL CA/STAGE IV; [place]- G-3; INTERMEDIATE RISK;  [bil Pul lung nodules;incidental s/p Bx Dr.Byrnett/Dr.Oaks] s/p Cytoreductive nephrectomy; Dr.Brandon- [id number]; July 11th 2017 CT- Enlarging Lung nodules  # Aug 7th 2017- PAZOPANIB; STOP SEP 2017 [pancreatitis/poor tol]  # OCT 1 week- START NIVO q 2W x4; DEC 8th CT- "Progression"; Continue Opdivo;APRIL-MAY 2018- STABLE LUNG NODULES [stopped sec to severe cough; NO Pneumonitis]  # May 31st 2018- [place] 60mg /day; July 31st CT lung- improved lung nodules; Sep 1st week- cabo- 40mg /day [dose reduced sec to mult intol]; Oct 1st 2week-Cabo 20mg /day;   # NOV 15th 2018- CT  chest- slight progression of lung nodules [poor tol to higer doses];   # Nov 28th 2018- SUTENT 50 mg 2w-on  &1 w-OFF;  # MARCH 2019- Progression; start Axitinib 5 mg BID  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 He reports symptoms started approximately 2 weeks ago with symptoms occurring 4-12 times per day. He describes diarrhea as watery, loose, orange brown, large volume. He feels symptoms fluctuate as to improving to worsening. No changes to odor. Diarrhea occurs at night. Per wife, she estimates approximately 4 times per evening. He denies blood in his stool. Associated symptoms: cramping unrelieved by defecation, fecal urgency, nocturnal diarrhea and watery diarrhea. Denies: abdominal distension, bloating, fecal incontinence, fever, floating stools, flushing, greasy stool, melena, mucus with stools and night sweats. Relationship to food: diarrhea occurs promptly after eating. He has had diarrhea in the past with cabometyx but feels this is worse and unresolved, wehreas previously diarrhea was controlled with Imodium. He has been taking maximum daily dose of Imodium at home without improvement in symptoms. Denies history of UC, IBS, or chronic diarrhea. Patient believes it is related to his radiation.  He has not been taking anything for pain stating that pain medications generally do not  work for him.  He denies previous visits for the  symptoms or prior evaluation.  ECOG FS:1 - Symptomatic but completely ambulatory  Review of systems- Review of Systems  Constitutional: Positive for malaise/fatigue. Negative for chills, fever and weight loss.  HENT: Negative for congestion, ear discharge, ear pain, sinus pain, sore throat and tinnitus.   Eyes: Negative.   Respiratory: Negative.  Negative for cough, sputum production and shortness of breath.   Cardiovascular: Negative for chest pain, palpitations, orthopnea, claudication and leg swelling.  Gastrointestinal: Positive for abdominal pain and diarrhea. Negative for blood in stool, constipation, heartburn, nausea and vomiting.  Genitourinary: Negative.   Musculoskeletal: Negative.   Skin: Negative.   Neurological: Positive for dizziness and weakness. Negative for tingling and headaches.  Endo/Heme/Allergies: Negative.   Psychiatric/Behavioral: Negative.     Current treatment- Axitinib 5mg  BID & Cabometyx   Allergies  Allergen Reactions  . Lisinopril Swelling  . Other Itching    Nitroglycerin Patch.    Past Medical History:  Diagnosis Date  . CAD (coronary artery disease)   . Cancer ([place])    left arm  . COPD (chronic obstructive pulmonary disease) ([place])   . Hypertension   . Kidney stone   . Lung cancer ([place])   . Melanoma ([place]) 01/24/2016   right shoulder  . Thyroid nodule 02/02/2016   left BENIGN THYROID NODULE by FNA    Past Surgical History:  Procedure Laterality Date  . arm surgery Left    arm  . cardiac stents  2011   Angioplasty / Stenting Femoral-X2  . COLONOSCOPY  04/01/12  . CORONARY ANGIOPLASTY    . EXCISION MELANOMA WITH SENTINEL LYMPH NODE BIOPSY Right 01/24/2016   Procedure: EXCISION MELANOMA Right Shoulder;  Surgeon: Robert Bellow, MD;  Location: [facility] ORS;  Service: General;  Laterality: Right;  . LAPAROSCOPIC NEPHRECTOMY, HAND ASSISTED Left 04/24/2016   Procedure: HAND ASSISTED LAPAROSCOPIC NEPHRECTOMY;  Surgeon: Hollice Espy, MD;   Location: [facility] ORS;  Service: Urology;  Laterality: Left;  Marland Kitchen VIDEO ASSISTED THORACOSCOPY (VATS)/THOROCOTOMY Left 03/08/2016   Procedure: VIDEO ASSISTED THORACOSCOPY (VATS) with lung biopsy - Left ;  Surgeon: Robert Bellow, MD;  Location: [facility] ORS;  Service: General;  Laterality: Left;    Social History   Socioeconomic History  . Marital status: Married    Spouse name: Not on file  . Number of children: Not on file  . Years of education: Not on file  . Highest education level: Not on file  Occupational History  . Not on file  Social Needs  . Financial resource strain: Not on file  . Food insecurity:    Worry: Not on file    Inability: Not on file  . Transportation needs:    Medical: Not on file    Non-medical: Not on file  Tobacco Use  . Smoking status: Former Smoker    Packs/day: 2.00    Years: 30.00    Pack years: 60.00    Types: Cigarettes    Start date: 01/18/1956    Last attempt to quit: 01/17/1969    Years since quitting: 49.5  . Smokeless tobacco: Never Used  Substance and Sexual Activity  . Alcohol use: No    Alcohol/week: 0.0 standard drinks    Comment: BEER OCC  . Drug use: No  . Sexual activity: Yes  Lifestyle  . Physical activity:    Days per week: Not on file    Minutes per session: Not on file  . Stress:  Not on file  Relationships  . Social connections:    Talks on phone: Not on file    Gets together: Not on file    Attends religious service: Not on file    Active member of club or organization: Not on file    Attends meetings of clubs or organizations: Not on file    Relationship status: Not on file  . Intimate partner violence:    Fear of current or ex partner: Not on file    Emotionally abused: Not on file    Physically abused: Not on file    Forced sexual activity: Not on file  Other Topics Concern  . Not on file  Social History Narrative  . Not on file    Family History  Problem Relation Age of Onset  . Hodgkin's lymphoma Daughter 5    . Heart attack Father   . Kidney cancer Neg Hx   . Kidney disease Neg Hx   . Prostate cancer Neg Hx     Current Outpatient Medications:  .  acetaminophen (TYLENOL) 500 MG tablet, Take 500 mg by mouth every 6 (six) hours as needed for moderate pain., Disp: , Rfl:  .  albuterol (PROVENTIL HFA;VENTOLIN HFA) 108 (90 Base) MCG/ACT inhaler, Inhale 2 puffs into the lungs every 6 (six) hours as needed for wheezing or shortness of breath., Disp: 1 Inhaler, Rfl: 2 .  aspirin EC 81 MG tablet, Take 81 mg by mouth daily., Disp: , Rfl:  .  atorvastatin (LIPITOR) 40 MG tablet, Take 40 mg by mouth daily., Disp: , Rfl:  .  busPIRone (BUSPAR) 5 MG tablet, Take 2 tablets by mouth daily., Disp: , Rfl:  .  cabozantinib (CABOMETYX) 40 MG tablet, Take 1 tablet (40 mg total) by mouth daily. Take on an empty stomach, 1 hour before or 2 hours after meals., Disp: 30 tablet, Rfl: 4 .  clopidogrel (PLAVIX) 75 MG tablet, Take 75 mg by mouth daily., Disp: , Rfl:  .  dronabinol (MARINOL) 2.5 MG capsule, Take 1 capsule (2.5 mg total) by mouth 2 (two) times daily before a meal., Disp: 60 capsule, Rfl: 1 .  DULoxetine (CYMBALTA) 30 MG capsule, Take 2 capsules by mouth daily., Disp: , Rfl:  .  ferrous sulfate 325 (65 FE) MG EC tablet, Take 325 mg by mouth 3 (three) times daily with meals., Disp: , Rfl:  .  fluticasone (FLONASE) 50 MCG/ACT nasal spray, Place 2 sprays into both nostrils daily., Disp: 16 g, Rfl: 2 .  Fluticasone-Salmeterol (ADVAIR DISKUS) 500-50 MCG/DOSE AEPB, Inhale 1 puff into the lungs 2 (two) times daily., Disp: 60 each, Rfl: 11 .  ipratropium-albuterol (DUONEB) 0.5-2.5 (3) MG/3ML SOLN, Take 3 mLs by nebulization every 4 (four) hours as needed., Disp: 360 mL, Rfl: 3 .  loperamide (IMODIUM) 1 MG/5ML solution, Take 4 mg by mouth as needed for diarrhea or loose stools., Disp: , Rfl:  .  loratadine (CLARITIN) 10 MG tablet, Take 10 mg by mouth daily., Disp: , Rfl:  .  MELATONIN PO, Take 10 mg by mouth at bedtime. ,  Disp: , Rfl:  .  Misc Natural Products (GLUCOSAMINE CHOND COMPLEX/MSM) TABS, Take 2 tablets by mouth daily., Disp: , Rfl:  .  montelukast (SINGULAIR) 10 MG tablet, TAKE 1 TABLET BY MOUTH EVERYDAY AT BEDTIME, Disp: 30 tablet, Rfl: 3 .  nitroGLYCERIN (NITROSTAT) 0.4 MG SL tablet, Place 0.4 mg under the tongue every 5 (five) minutes as needed. , Disp: , Rfl:  .  Omega-3 Fatty Acids (FISH OIL) 1000 MG CPDR, Take 1 capsule by mouth daily., Disp: , Rfl:  .  OXYGEN, Inhale 2 L into the lungs at bedtime., Disp: , Rfl:  .  pyridostigmine (MESTINON) 60 MG tablet, Take 1 tablet by mouth 2 (two) times daily., Disp: , Rfl:  .  tamsulosin (FLOMAX) 0.4 MG CAPS capsule, TAKE 1 CAPSULE (0.4 MG TOTAL) BY MOUTH EVERY MORNING., Disp: 30 capsule, Rfl: 3 .  Tiotropium Bromide Monohydrate (SPIRIVA RESPIMAT) 1.25 MCG/ACT AERS, Inhale 1.25 Act into the lungs 2 (two) times daily., Disp: 1 Inhaler, Rfl: 11 .  tiZANidine (ZANAFLEX) 4 MG tablet, , Disp: , Rfl:  .  amLODipine (NORVASC) 5 MG tablet, Take 1 tablet (5 mg total) by mouth 2 (two) times daily. (Patient not taking: Reported on 06/02/2018), Disp: 60 tablet, Rfl: 0 .  diphenoxylate-atropine (LOMOTIL) 2.5-0.025 MG tablet, Take 2 tablets by mouth 4 (four) times daily as needed for diarrhea or loose stools., Disp: 30 tablet, Rfl: 0 .  docusate sodium (COLACE) 100 MG capsule, Take 1 capsule (100 mg total) by mouth 2 (two) times daily. (Patient not taking: Reported on 07/14/2018), Disp: 60 capsule, Rfl: 3 .  losartan (COZAAR) 25 MG tablet, Take 2 tablets (50 mg total) by mouth every morning. (Patient not taking: Reported on 06/23/2018), Disp: 60 tablet, Rfl: 3 .  metoprolol succinate (TOPROL XL) 25 MG 24 hr tablet, Take 1 tablet (25 mg total) by mouth daily. Take it along with 50 of toprol xl once a day. (Patient not taking: Reported on 06/02/2018), Disp: 60 tablet, Rfl: 3 .  midodrine (PROAMATINE) 2.5 MG tablet, Take 1 tablet (2.5 mg total) by mouth 2 (two) times daily with a meal.  (Patient not taking: Reported on 06/02/2018), Disp: 60 tablet, Rfl: 0 .  oxyCODONE (OXY IR/ROXICODONE) 5 MG immediate release tablet, Take 1 tablet (5 mg total) by mouth every 4 (four) hours as needed for severe pain., Disp: 30 tablet, Rfl: 0  Physical exam:  Vitals:   03/24/18 1009  BP: 103/65  Pulse: 63  Resp: 20  Temp: (!) 96.7 F (35.9 C)  TempSrc: Tympanic  SpO2: 98%  Weight: 203 lb 12.8 oz (92.4 kg)   Physical Exam  Constitutional: He is oriented to person, place, and time. He appears well-developed and well-nourished. No distress.  HENT:  Head: Normocephalic and atraumatic.  Nose: Nose normal.  Mouth/Throat: Oropharynx is clear and moist. No oropharyngeal exudate.  Eyes: Pupils are equal, round, and reactive to light. Conjunctivae and EOM are normal. No scleral icterus.  Neck: Normal range of motion. Neck supple.  Cardiovascular: Normal rate, regular rhythm and normal heart sounds.  Pulmonary/Chest: Effort normal and breath sounds normal. He has no wheezes.  Abdominal: Soft. Bowel sounds are normal. He exhibits no distension. There is tenderness (Umbilical & hypogastric tenderness). There is no rebound.  Grimacing during pain episodes  Musculoskeletal: Normal range of motion. He exhibits no edema.  Neurological: He is alert and oriented to person, place, and time.  Skin: Skin is warm and dry.  Psychiatric: He has a normal mood and affect. His behavior is normal.     CMP Latest Ref Rng & Units 07/14/2018  Glucose 70 - 99 mg/dL 94  BUN 8 - 23 mg/dL 11  Creatinine 0.61 - 1.24 mg/dL 1.29(H)  Sodium 135 - 145 mmol/L 139  Potassium 3.5 - 5.1 mmol/L 3.5  Chloride 98 - 111 mmol/L 104  CO2 22 - 32 mmol/L 28  Calcium 8.9 -  10.3 mg/dL 9.4  Total Protein 6.5 - 8.1 g/dL 6.4(L)  Total Bilirubin 0.3 - 1.2 mg/dL 1.0  Alkaline Phos 38 - 126 U/L 89  AST 15 - 41 U/L 27  ALT 0 - 44 U/L 30   CBC Latest Ref Rng & Units 07/14/2018  WBC 3.8 - 10.6 K/uL 7.3  Hemoglobin 13.0 - 18.0 g/dL  16.0  Hematocrit 40.0 - 52.0 % 44.9  Platelets 150 - 440 K/uL 127(L)    No images are attached to the encounter.  Dg Abd 2 Views  Result Date: 07/14/2018 CLINICAL DATA:  Abdominal pain.  History metastatic renal carcinoma. EXAM: ABDOMEN - 2 VIEW COMPARISON:  Images from a PET scan on 06/11/2018 FINDINGS: No evidence of acute bowel obstruction, significant ileus or free intraperitoneal air by x-ray. Clips are present related to prior left nephrectomy. No abnormal calcifications or visible bony abnormalities. IMPRESSION: No acute findings. Electronically Signed   By: Aletta Edouard M.D.   On: 07/14/2018 13:31    Assessment and plan- Patient is a 77 y.o. male with left kidney cancer metastatic to lung who presents to [facility] for diarrhea.   1.  Left kidney cancer metastatic to lung-5/19 CT shows stable to improved lung lesions however, multiple lytic lesions in the bones showing mixed response.  History of intolerances to multiple medications and TKIs in the past.  Currently on axitinib 5mg  BID and Cabometyx.  Completed palliative radiation to left hip on 07/07/2018.   2. Diarrhea- acute. Cdiff and GI panel negative.  Question if related to cabometyx or radiation induced.  X-ray today independently reviewed and was negative for obstruction or ileus. Start Lomotil.   3. Abdominal Pain-acute. etiology unclear diarrhea vs others?  Amylase and lipase normal. LFTs normal.   No acute process on imaging.  Morphine 2 mg IV given in clinic with some improvement in pain.  Start Oxycodone 5mg  PO q4h prn pain but questioning if control of diarrhea may control underlying pain. Advised that if diarrhea improves he could experience constipation with opiates.   4. CKD-chronic. Stage 3a. Cr 1.29, BUN 11, gfr 52. Stable. Given diarrhea, 1L IV fluids given in clinic.   Follow up with Dr. Rogue Bussing on 07/16/2018. If symptoms not improved, may consider additional imaging.  Visit Diagnosis 1. Lower abdominal pain   2.  Cancer of left kidney excluding renal pelvis ([place])   3. Diarrhea, unspecified type   4. Stage 3 chronic kidney disease ([place])      Patient expressed understanding and was in agreement with this plan. He also understands that He can call clinic at any time with any questions, concerns, or complaints.    Beckey Rutter, DNP, AGNP-C [place] at [facility] [phone] (work cell) [phone] (office)

## 2018-07-14 NOTE — Telephone Encounter (Signed)
Patient c/o diarrhea x 2 weeks. Taking imodium. No relief. Currently on oral chemotherapy - cabometyx and radiation therapy.- c/o abdominal discomfort. Apt given to pt's wife for labs at 10 am and see NP at 1 30 am. Cbc, metc, mag and gi panel/cdiff workup

## 2018-07-14 NOTE — Telephone Encounter (Signed)
Left a voice mail message informing patient that his stool tests were negative and he is ok to start taking the Lomotil that was prescribed today, per Beckey Rutter, NP.      Currie Paris

## 2018-07-16 ENCOUNTER — Inpatient Hospital Stay: Payer: PPO

## 2018-07-16 ENCOUNTER — Encounter: Payer: Self-pay | Admitting: Internal Medicine

## 2018-07-16 ENCOUNTER — Other Ambulatory Visit: Payer: Self-pay | Admitting: *Deleted

## 2018-07-16 ENCOUNTER — Ambulatory Visit
Admission: RE | Admit: 2018-07-16 | Discharge: 2018-07-16 | Disposition: A | Discharge status: Home / Self Care | Payer: PPO | Source: Ambulatory Visit | Attending: Internal Medicine | Admitting: Internal Medicine

## 2018-07-16 ENCOUNTER — Inpatient Hospital Stay (HOSPITAL_BASED_OUTPATIENT_CLINIC_OR_DEPARTMENT_OTHER): Payer: PPO | Admitting: Internal Medicine

## 2018-07-16 ENCOUNTER — Other Ambulatory Visit: Payer: Self-pay

## 2018-07-16 VITALS — BP 125/88 | HR 76 | Temp 97.6°F | Resp 20 | Ht 73.0 in | Wt 199.0 lb

## 2018-07-16 DIAGNOSIS — I700 Atherosclerosis of aorta: Secondary | ICD-10-CM | POA: Insufficient documentation

## 2018-07-16 DIAGNOSIS — Z85820 Personal history of malignant melanoma of skin: Secondary | ICD-10-CM

## 2018-07-16 DIAGNOSIS — C642 Malignant neoplasm of left kidney, except renal pelvis: Secondary | ICD-10-CM | POA: Diagnosis not present

## 2018-07-16 DIAGNOSIS — D7389 Other diseases of spleen: Secondary | ICD-10-CM | POA: Diagnosis not present

## 2018-07-16 DIAGNOSIS — I2510 Atherosclerotic heart disease of native coronary artery without angina pectoris: Secondary | ICD-10-CM

## 2018-07-16 DIAGNOSIS — I951 Orthostatic hypotension: Secondary | ICD-10-CM | POA: Diagnosis not present

## 2018-07-16 DIAGNOSIS — Z7982 Long term (current) use of aspirin: Secondary | ICD-10-CM

## 2018-07-16 DIAGNOSIS — Z79899 Other long term (current) drug therapy: Secondary | ICD-10-CM | POA: Diagnosis not present

## 2018-07-16 DIAGNOSIS — C7951 Secondary malignant neoplasm of bone: Secondary | ICD-10-CM | POA: Diagnosis not present

## 2018-07-16 DIAGNOSIS — N400 Enlarged prostate without lower urinary tract symptoms: Secondary | ICD-10-CM | POA: Diagnosis not present

## 2018-07-16 DIAGNOSIS — R918 Other nonspecific abnormal finding of lung field: Secondary | ICD-10-CM | POA: Diagnosis not present

## 2018-07-16 DIAGNOSIS — Z85528 Personal history of other malignant neoplasm of kidney: Secondary | ICD-10-CM | POA: Insufficient documentation

## 2018-07-16 DIAGNOSIS — R197 Diarrhea, unspecified: Secondary | ICD-10-CM | POA: Diagnosis not present

## 2018-07-16 DIAGNOSIS — R1032 Left lower quadrant pain: Secondary | ICD-10-CM

## 2018-07-16 DIAGNOSIS — N281 Cyst of kidney, acquired: Secondary | ICD-10-CM | POA: Insufficient documentation

## 2018-07-16 DIAGNOSIS — J449 Chronic obstructive pulmonary disease, unspecified: Secondary | ICD-10-CM

## 2018-07-16 DIAGNOSIS — R109 Unspecified abdominal pain: Secondary | ICD-10-CM | POA: Diagnosis not present

## 2018-07-16 DIAGNOSIS — R634 Abnormal weight loss: Secondary | ICD-10-CM

## 2018-07-16 DIAGNOSIS — R42 Dizziness and giddiness: Secondary | ICD-10-CM

## 2018-07-16 DIAGNOSIS — E079 Disorder of thyroid, unspecified: Secondary | ICD-10-CM | POA: Diagnosis not present

## 2018-07-16 DIAGNOSIS — I358 Other nonrheumatic aortic valve disorders: Secondary | ICD-10-CM | POA: Insufficient documentation

## 2018-07-16 DIAGNOSIS — N183 Chronic kidney disease, stage 3 (moderate): Secondary | ICD-10-CM | POA: Diagnosis not present

## 2018-07-16 DIAGNOSIS — I129 Hypertensive chronic kidney disease with stage 1 through stage 4 chronic kidney disease, or unspecified chronic kidney disease: Secondary | ICD-10-CM | POA: Diagnosis not present

## 2018-07-16 DIAGNOSIS — Z87891 Personal history of nicotine dependence: Secondary | ICD-10-CM

## 2018-07-16 DIAGNOSIS — Z87442 Personal history of urinary calculi: Secondary | ICD-10-CM

## 2018-07-16 MED ORDER — IOPAMIDOL (ISOVUE-300) INJECTION 61%
100.0000 mL | Freq: Once | INTRAVENOUS | Status: AC | PRN
  Administered 2018-07-16: 100 mL via INTRAVENOUS

## 2018-07-16 MED ORDER — PREDNISONE 20 MG PO TABS: 20.0000 mg | ORAL_TABLET | Freq: Every day | ORAL | 0 refills | Status: DC

## 2018-07-16 NOTE — Addendum Note (Signed)
Addended by: Cammie Sickle on: 07/16/2018 03:23 PM   Modules accepted: Orders

## 2018-07-16 NOTE — Progress Notes (Signed)
[facility] OFFICE PROGRESS NOTE  Patient Care Team: Dion Body, MD as PCP - General (Family Medicine) Jannet Mantis, MD (Dermatology) Bary Castilla, Forest Gleason, MD (General Surgery) Hollice Espy, MD as Consulting Physician (Urology) Cammie Sickle, MD as Medical Oncologist (Medical Oncology) Isaias Cowman, MD as Consulting Physician (Cardiology) Harriette Bouillon, MD as Referring Physician (Cardiology)  Cancer Staging No matching staging information was found for the patient.   Oncology History   # MAY- June 2017- METASTATIC CLEAR CELL; LEFT RENAL CA/STAGE IV; [place]- G-3; INTERMEDIATE RISK;  [bil Pul lung nodules;incidental s/p Bx Dr.Byrnett/Dr.Oaks] s/p Cytoreductive nephrectomy; Dr.Brandon- [id number]; July 11th 2017 CT- Enlarging Lung nodules  # Aug 7th 2017- PAZOPANIB; STOP SEP 2017 [pancreatitis/poor tol]  # OCT 1 week- START NIVO q 2W x4; DEC 8th CT- "Progression"; Continue Opdivo;APRIL-MAY 2018- STABLE LUNG NODULES [stopped sec to severe cough; NO Pneumonitis]  # May 31st 2018- [place] 60mg /day; July 31st CT lung- improved lung nodules; Sep 1st week- cabo- 40mg /day [dose reduced sec to mult intol]; Oct 1st 2week-Cabo 20mg /day;   # NOV 15th 2018- CT chest- slight progression of lung nodules [poor tol to higer doses];   # Nov 28th 2018- SUTENT 50 mg 2w-on  &1 w-OFF;  # MARCH 2019- Progression; start Axitinib 5 mg BID; July 2019- MIXED response; worsening/new skeletal lesions/stable to improved lung lesions; STOP Axitinib;   # Left hip s/p RT [mid-AUG 2019]  # AUG 12th- RE-START CABO 40 mg/day;   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  male pleasant patient above history of metastatic renal cell cancer currently on cabo is here for follow-up.  Patient complains of worsening abdominal pain-left lower quadrant epigastric radiating all over x 2 weeks; 8 and a scale of 10.  Spasmodic.  Intermittent.  Last for about 1 to 2 hours.  Associated nausea no vomiting.  Has had diarrhea " all day last 3 4 days".  Poor appetite.  Continues to have dizzy spells.  Review of Systems  Constitutional: Positive for malaise/fatigue and weight loss. Negative for chills, diaphoresis and fever.  HENT: Negative for nosebleeds and sore throat.   Eyes: Negative for double vision.  Respiratory: Negative for cough, hemoptysis, sputum production, shortness of breath and wheezing.   Cardiovascular: Negative for chest pain, palpitations, orthopnea and leg swelling.  Gastrointestinal: Positive for abdominal pain, diarrhea and nausea. Negative for blood in stool, constipation, heartburn, melena and vomiting.  Genitourinary: Negative for dysuria, frequency and urgency.  Musculoskeletal: Positive for back pain. Negative for joint pain.  Skin: Negative.  Negative for itching and rash.  Neurological: Positive for dizziness. Negative for tingling, focal weakness, weakness and headaches.  Endo/Heme/Allergies: Does not bruise/bleed easily.  Psychiatric/Behavioral: Negative for depression. The patient is not nervous/anxious and does not have insomnia.       PAST MEDICAL HISTORY :  Past Medical History:  Diagnosis Date  . CAD (coronary artery disease)   . Cancer ([place])    left arm  . COPD (chronic obstructive pulmonary disease) ([place])   . Hypertension   .  Kidney stone   . Lung cancer ([place])   . Melanoma ([place]) 01/24/2016   right shoulder  . Thyroid nodule 02/02/2016   left BENIGN THYROID NODULE by FNA     PAST SURGICAL HISTORY :   Past Surgical History:  Procedure Laterality Date  . arm surgery Left    arm  . cardiac stents  2011   Angioplasty / Stenting Femoral-X2  . COLONOSCOPY  04/01/12  . CORONARY ANGIOPLASTY    . EXCISION MELANOMA WITH SENTINEL LYMPH NODE BIOPSY Right 01/24/2016   Procedure: EXCISION MELANOMA Right Shoulder;  Surgeon: Robert Bellow, MD;  Location: [facility] ORS;  Service: General;  Laterality: Right;  . LAPAROSCOPIC NEPHRECTOMY, HAND ASSISTED Left 04/24/2016   Procedure: HAND ASSISTED LAPAROSCOPIC NEPHRECTOMY;  Surgeon: Hollice Espy, MD;  Location: [facility] ORS;  Service: Urology;  Laterality: Left;  Marland Kitchen VIDEO ASSISTED THORACOSCOPY (VATS)/THOROCOTOMY Left 03/08/2016   Procedure: VIDEO ASSISTED THORACOSCOPY (VATS) with lung biopsy - Left ;  Surgeon: Robert Bellow, MD;  Location: [facility] ORS;  Service: General;  Laterality: Left;    FAMILY HISTORY :   Family History  Problem Relation Age of Onset  . Hodgkin's lymphoma Daughter 5  . Heart attack Father   . Kidney cancer Neg Hx   . Kidney disease Neg Hx   . Prostate cancer Neg Hx     SOCIAL HISTORY:   Social History   Tobacco Use  . Smoking status: Former Smoker    Packs/day: 2.00    Years: 30.00    Pack years: 60.00    Types: Cigarettes    Start date: 01/18/1956    Last attempt to quit: 01/17/1969    Years since quitting: 49.5  . Smokeless tobacco: Never Used  Substance Use Topics  . Alcohol use: No    Alcohol/week: 0.0 standard drinks    Comment: BEER OCC  . Drug use: No    ALLERGIES:  is allergic to lisinopril and other.  MEDICATIONS:  Current Outpatient Medications  Medication Sig Dispense Refill  . acetaminophen (TYLENOL) 500 MG tablet Take 500 mg by mouth every 6 (six) hours as needed for moderate pain.    Marland Kitchen albuterol (PROVENTIL HFA;VENTOLIN HFA) 108 (90 Base) MCG/ACT inhaler Inhale 2 puffs into the lungs every 6 (six) hours as needed for wheezing or shortness of breath. 1 Inhaler 2  . aspirin EC  81 MG tablet Take 81 mg by mouth daily.    Marland Kitchen atorvastatin (LIPITOR) 40 MG tablet Take 40 mg by mouth daily.    . busPIRone (BUSPAR) 5 MG tablet Take 2 tablets by mouth daily.    . cabozantinib (CABOMETYX) 40 MG tablet Take 1 tablet (40 mg total) by mouth daily. Take on an empty stomach, 1 hour before or 2 hours after meals. 30 tablet 4  . clopidogrel (PLAVIX) 75 MG tablet Take 75 mg by mouth daily.    Marland Kitchen dronabinol (MARINOL) 2.5 MG capsule Take 1 capsule (2.5 mg total) by mouth 2 (two) times daily before a meal. 60 capsule 1  . DULoxetine (CYMBALTA) 30 MG capsule Take 2 capsules by mouth daily.    . ferrous sulfate 325 (65 FE) MG EC tablet Take 325 mg by mouth 3 (three) times daily with meals.    . fluticasone (FLONASE) 50 MCG/ACT nasal spray Place 2 sprays into both nostrils daily. 16 g 2  . Fluticasone-Salmeterol (ADVAIR DISKUS) 500-50 MCG/DOSE AEPB Inhale 1 puff into the lungs 2 (two) times daily. 60 each 11  . ipratropium-albuterol (DUONEB)  0.5-2.5 (3) MG/3ML SOLN Take 3 mLs by nebulization every 4 (four) hours as needed. 360 mL 3  . loperamide (IMODIUM) 1 MG/5ML solution Take 4 mg by mouth as needed for diarrhea or loose stools.    Marland Kitchen loratadine (CLARITIN) 10 MG tablet Take 10 mg by mouth daily.    Marland Kitchen losartan (COZAAR) 25 MG tablet Take 2 tablets (50 mg total) by mouth every morning. 60 tablet 3  . MELATONIN PO Take 10 mg by mouth at bedtime.     . Misc Natural Products (GLUCOSAMINE CHOND COMPLEX/MSM) TABS Take 2 tablets by mouth daily.    . montelukast (SINGULAIR) 10 MG tablet TAKE 1 TABLET BY MOUTH EVERYDAY AT BEDTIME 30 tablet 3  . Omega-3 Fatty Acids (FISH OIL) 1000 MG CPDR Take 1 capsule by mouth daily.    Marland Kitchen oxyCODONE (OXY IR/ROXICODONE) 5 MG immediate release tablet Take 1 tablet (5 mg total) by mouth every 4 (four) hours as needed for severe pain. 30 tablet 0  . OXYGEN Inhale 2 L into the lungs at bedtime.    . pyridostigmine (MESTINON) 60 MG tablet Take 1 tablet by mouth 2 (two) times  daily.    . tamsulosin (FLOMAX) 0.4 MG CAPS capsule TAKE 1 CAPSULE (0.4 MG TOTAL) BY MOUTH EVERY MORNING. 30 capsule 3  . Tiotropium Bromide Monohydrate (SPIRIVA RESPIMAT) 1.25 MCG/ACT AERS Inhale 1.25 Act into the lungs 2 (two) times daily. 1 Inhaler 11  . tiZANidine (ZANAFLEX) 4 MG tablet Take 4 mg by mouth daily.     Marland Kitchen amLODipine (NORVASC) 5 MG tablet Take 1 tablet (5 mg total) by mouth 2 (two) times daily. (Patient not taking: Reported on 06/02/2018) 60 tablet 0  . diphenoxylate-atropine (LOMOTIL) 2.5-0.025 MG tablet Take 2 tablets by mouth 4 (four) times daily as needed for diarrhea or loose stools. (Patient not taking: Reported on 07/16/2018) 30 tablet 0  . docusate sodium (COLACE) 100 MG capsule Take 1 capsule (100 mg total) by mouth 2 (two) times daily. (Patient not taking: Reported on 07/14/2018) 60 capsule 3  . metoprolol succinate (TOPROL XL) 25 MG 24 hr tablet Take 1 tablet (25 mg total) by mouth daily. Take it along with 50 of toprol xl once a day. (Patient not taking: Reported on 07/16/2018) 60 tablet 3  . midodrine (PROAMATINE) 2.5 MG tablet Take 1 tablet (2.5 mg total) by mouth 2 (two) times daily with a meal. (Patient not taking: Reported on 06/02/2018) 60 tablet 0  . nitroGLYCERIN (NITROSTAT) 0.4 MG SL tablet Place 0.4 mg under the tongue every 5 (five) minutes as needed.      No current facility-administered medications for this visit.     PHYSICAL EXAMINATION: ECOG PERFORMANCE STATUS: 1 - Symptomatic but completely ambulatory  BP 125/88   Pulse 76   Temp 97.6 F (36.4 C) (Tympanic)   Resp 20   Ht 6\' 1"  (1.854 m)   Wt 199 lb (90.3 kg)   BMI 26.25 kg/m   Filed Weights   07/16/18 1030  Weight: 199 lb (90.3 kg)    Physical Exam  Constitutional: He is oriented to person, place, and time and well-developed, well-nourished, and in no distress.  Accompanied by his wife daughter.  Walking himself.  HENT:  Head: Normocephalic and atraumatic.  Mouth/Throat: Oropharynx is  clear and moist. No oropharyngeal exudate.  Eyes: Pupils are equal, round, and reactive to light.  Neck: Normal range of motion. Neck supple.  Cardiovascular: Normal rate and regular rhythm.  Pulmonary/Chest: No respiratory  distress. He has no wheezes.  Abdominal: Soft. Bowel sounds are normal. He exhibits no distension and no mass. There is tenderness. There is rebound. There is no guarding.  Musculoskeletal: Normal range of motion. He exhibits no edema or tenderness.  Neurological: He is alert and oriented to person, place, and time.  Skin: Skin is warm.  Psychiatric: Affect normal.       LABORATORY DATA:  I have reviewed the data as listed    Component Value Date/Time   NA 139 07/14/2018 1008   NA 146 (H) 05/25/2016 1514   K 3.5 07/14/2018 1008   CL 104 07/14/2018 1008   CO2 28 07/14/2018 1008   GLUCOSE 94 07/14/2018 1008   BUN 11 07/14/2018 1008   BUN 19 05/25/2016 1514   CREATININE 1.29 (H) 07/14/2018 1008   CALCIUM 9.4 07/14/2018 1008   PROT 6.4 (L) 07/14/2018 1008   ALBUMIN 3.9 07/14/2018 1008   AST 27 07/14/2018 1008   ALT 30 07/14/2018 1008   ALKPHOS 89 07/14/2018 1008   BILITOT 1.0 07/14/2018 1008   GFRNONAA 52 (L) 07/14/2018 1008   GFRAA >60 07/14/2018 1008    No results found for: SPEP, UPEP  Lab Results  Component Value Date   WBC 7.3 07/14/2018   NEUTROABS 5.0 07/14/2018   HGB 16.0 07/14/2018   HCT 44.9 07/14/2018   MCV 93.6 07/14/2018   PLT 127 (L) 07/14/2018      Chemistry      Component Value Date/Time   NA 139 07/14/2018 1008   NA 146 (H) 05/25/2016 1514   K 3.5 07/14/2018 1008   CL 104 07/14/2018 1008   CO2 28 07/14/2018 1008   BUN 11 07/14/2018 1008   BUN 19 05/25/2016 1514   CREATININE 1.29 (H) 07/14/2018 1008      Component Value Date/Time   CALCIUM 9.4 07/14/2018 1008   ALKPHOS 89 07/14/2018 1008   AST 27 07/14/2018 1008   ALT 30 07/14/2018 1008   BILITOT 1.0 07/14/2018 1008       RADIOGRAPHIC STUDIES: I have personally  reviewed the radiological images as listed and agreed with the findings in the report. Dg Abd 2 Views  Result Date: 07/14/2018 CLINICAL DATA:  Abdominal pain.  History metastatic renal carcinoma. EXAM: ABDOMEN - 2 VIEW COMPARISON:  Images from a PET scan on 06/11/2018 FINDINGS: No evidence of acute bowel obstruction, significant ileus or free intraperitoneal air by x-ray. Clips are present related to prior left nephrectomy. No abnormal calcifications or visible bony abnormalities. IMPRESSION: No acute findings. Electronically Signed   By: Aletta Edouard M.D.   On: 07/14/2018 13:31     ASSESSMENT & PLAN:  Cancer of left kidney excluding renal pelvis [facility])  # Left kidney cancer metastatic to the lung- June 2019 CT scan shows stable to improved lung lesions; however multiple lytic lesions in the bones.- MIXED RESPONSE. Currently on cabometyx 40 mg/ day;  # worsening abdominal pain- ? Perforation vs diverticulitis- recommend CT scan asap; HOLD cabometyx  # Diarrhea- sec to cabo-HOLD cabo; reocmmend anti-dirrheals.   # Multiple lytic lesions in the bones/left hip-on Xgeva s/p RT- improved; HOLD X-geva today  #Orthostatic hypotension-clinically stable.  As per cardiology.  # Follow up TBD. HOLD X-geva today.      Orders Placed This Encounter  Procedures  . CT Abdomen Pelvis W Contrast    Standing Status:   Future    Standing Expiration Date:   07/16/2019    Order Specific Question:   **  REASON FOR EXAM (FREE TEXT)    Answer:   worsning abdominal pain; hx of kidny cancer    Order Specific Question:   If indicated for the ordered procedure, I authorize the administration of contrast media per Radiology protocol    Answer:   Yes    Order Specific Question:   Preferred imaging location?    Answer:   [facility]    Order Specific Question:   Is Oral Contrast requested for this exam?    Answer:   Yes, Per Radiology protocol    Order Specific Question:   Radiology Contrast Protocol - do  NOT remove file path    Answer:   [email or web address]   All questions were answered. The patient knows to call the clinic with any problems, questions or concerns.      Cammie Sickle, MD 07/16/2018 11:20 AM

## 2018-07-16 NOTE — Assessment & Plan Note (Addendum)
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 Currently on cabometyx 40 mg/ day;  # worsening abdominal pain- ? Perforation vs diverticulitis- recommend CT scan asap; HOLD cabometyx  # Diarrhea- sec to cabo-HOLD cabo; reocmmend anti-dirrheals.   # Multiple lytic lesions in the bones/left hip-on Xgeva s/p RT- improved; HOLD X-geva today  #Orthostatic hypotension-clinically stable.  As per cardiology.  # Follow up TBD. HOLD X-geva today.   Addendum: CT scan reviewed- "colitis";NO infection- GI PCR/c.diff-NEG.  HOLD cabo; follow up in 2 weeks/no labs; II opinion at [facility]. Worsening right iliac lytic lesion ~61mm.

## 2018-07-17 MED FILL — CABOMETYX 40 MG TABLET: 40 | 30 days supply | Qty: 30 | Fill #1

## 2018-07-20 DIAGNOSIS — R05 Cough: Secondary | ICD-10-CM | POA: Diagnosis not present

## 2018-07-20 DIAGNOSIS — R0602 Shortness of breath: Secondary | ICD-10-CM | POA: Diagnosis not present

## 2018-07-20 DIAGNOSIS — C642 Malignant neoplasm of left kidney, except renal pelvis: Secondary | ICD-10-CM | POA: Diagnosis not present

## 2018-07-20 DIAGNOSIS — J449 Chronic obstructive pulmonary disease, unspecified: Secondary | ICD-10-CM | POA: Diagnosis not present

## 2018-07-22 ENCOUNTER — Encounter: Payer: Self-pay | Admitting: Internal Medicine

## 2018-07-23 ENCOUNTER — Other Ambulatory Visit: Payer: Self-pay | Admitting: *Deleted

## 2018-07-23 NOTE — Telephone Encounter (Signed)
Wife called reporting that patient is continuing to have abdominal pain and is out of his pain medicine. He last got # 30 tabs on 8/26 from Beckey Rutter, NP. Please advise

## 2018-07-24 ENCOUNTER — Telehealth: Payer: Self-pay | Admitting: Internal Medicine

## 2018-07-24 MED ORDER — CIPROFLOXACIN HCL 500 MG PO TABS: 500.0000 mg | ORAL_TABLET | Freq: Two times a day (BID) | ORAL | 0 refills | Status: DC

## 2018-07-24 MED ORDER — OXYCODONE-ACETAMINOPHEN 5-325 MG PO TABS: 1.0000 | ORAL_TABLET | ORAL | 0 refills | Status: DC | PRN

## 2018-07-24 MED ORDER — METRONIDAZOLE 500 MG PO TABS: 500.0000 mg | ORAL_TABLET | Freq: Three times a day (TID) | ORAL | 0 refills | Status: DC

## 2018-07-24 NOTE — Telephone Encounter (Signed)
#   Worsening abdominal pain- spoke to wife. Reviewed the CT.   # STOP iron pills  # ciprofloxacin + flagyl  # percocet 5/325  Every 6 hours #30.   # [facility] tomorrow.

## 2018-07-24 NOTE — Telephone Encounter (Addendum)
See md phone note on 9/5

## 2018-07-25 ENCOUNTER — Other Ambulatory Visit: Payer: Self-pay | Admitting: *Deleted

## 2018-07-25 ENCOUNTER — Encounter: Payer: Self-pay | Admitting: Oncology

## 2018-07-25 ENCOUNTER — Other Ambulatory Visit: Payer: Self-pay

## 2018-07-25 ENCOUNTER — Inpatient Hospital Stay: Payer: PPO | Attending: Oncology | Admitting: Oncology

## 2018-07-25 ENCOUNTER — Inpatient Hospital Stay: Payer: PPO

## 2018-07-25 VITALS — BP 150/82 | HR 64 | Temp 97.5°F | Resp 18 | Wt 202.0 lb

## 2018-07-25 DIAGNOSIS — Z87891 Personal history of nicotine dependence: Secondary | ICD-10-CM | POA: Diagnosis not present

## 2018-07-25 DIAGNOSIS — Z79899 Other long term (current) drug therapy: Secondary | ICD-10-CM

## 2018-07-25 DIAGNOSIS — R1010 Upper abdominal pain, unspecified: Secondary | ICD-10-CM

## 2018-07-25 DIAGNOSIS — K529 Noninfective gastroenteritis and colitis, unspecified: Secondary | ICD-10-CM | POA: Insufficient documentation

## 2018-07-25 DIAGNOSIS — D696 Thrombocytopenia, unspecified: Secondary | ICD-10-CM | POA: Diagnosis not present

## 2018-07-25 DIAGNOSIS — R109 Unspecified abdominal pain: Secondary | ICD-10-CM | POA: Insufficient documentation

## 2018-07-25 DIAGNOSIS — C7800 Secondary malignant neoplasm of unspecified lung: Secondary | ICD-10-CM | POA: Diagnosis not present

## 2018-07-25 DIAGNOSIS — R1032 Left lower quadrant pain: Secondary | ICD-10-CM

## 2018-07-25 DIAGNOSIS — M899 Disorder of bone, unspecified: Secondary | ICD-10-CM | POA: Insufficient documentation

## 2018-07-25 DIAGNOSIS — C642 Malignant neoplasm of left kidney, except renal pelvis: Secondary | ICD-10-CM

## 2018-07-25 DIAGNOSIS — Z923 Personal history of irradiation: Secondary | ICD-10-CM | POA: Diagnosis not present

## 2018-07-25 DIAGNOSIS — C652 Malignant neoplasm of left renal pelvis: Secondary | ICD-10-CM | POA: Insufficient documentation

## 2018-07-25 DIAGNOSIS — R197 Diarrhea, unspecified: Secondary | ICD-10-CM

## 2018-07-25 DIAGNOSIS — I951 Orthostatic hypotension: Secondary | ICD-10-CM | POA: Diagnosis not present

## 2018-07-25 DIAGNOSIS — Z9221 Personal history of antineoplastic chemotherapy: Secondary | ICD-10-CM | POA: Insufficient documentation

## 2018-07-25 DIAGNOSIS — R3912 Poor urinary stream: Secondary | ICD-10-CM | POA: Diagnosis not present

## 2018-07-25 DIAGNOSIS — R51 Headache: Secondary | ICD-10-CM | POA: Diagnosis not present

## 2018-07-25 DIAGNOSIS — R740 Nonspecific elevation of levels of transaminase and lactic acid dehydrogenase [LDH]: Secondary | ICD-10-CM | POA: Insufficient documentation

## 2018-07-25 DIAGNOSIS — E860 Dehydration: Secondary | ICD-10-CM

## 2018-07-25 DIAGNOSIS — R7989 Other specified abnormal findings of blood chemistry: Secondary | ICD-10-CM

## 2018-07-25 LAB — CBC WITH DIFFERENTIAL/PLATELET
Basophils Absolute: 0 10*3/uL (ref 0–0.1)
Basophils Relative: 0 %
EOS PCT: 0 %
Eosinophils Absolute: 0 10*3/uL (ref 0–0.7)
HCT: 42.9 % (ref 40.0–52.0)
Hemoglobin: 15.1 g/dL (ref 13.0–18.0)
LYMPHS ABS: 0.6 10*3/uL — AB (ref 1.0–3.6)
LYMPHS PCT: 8 %
MCH: 33.8 pg (ref 26.0–34.0)
MCHC: 35.1 g/dL (ref 32.0–36.0)
MCV: 96.1 fL (ref 80.0–100.0)
MONOS PCT: 3 %
Monocytes Absolute: 0.2 10*3/uL (ref 0.2–1.0)
Neutro Abs: 6.5 10*3/uL (ref 1.4–6.5)
Neutrophils Relative %: 89 %
PLATELETS: 121 10*3/uL — AB (ref 150–440)
RBC: 4.46 MIL/uL (ref 4.40–5.90)
RDW: 17.6 % — ABNORMAL HIGH (ref 11.5–14.5)
WBC: 7.4 10*3/uL (ref 3.8–10.6)

## 2018-07-25 LAB — COMPREHENSIVE METABOLIC PANEL
ALBUMIN: 4.2 g/dL (ref 3.5–5.0)
ALT: 26 U/L (ref 0–44)
AST: 28 U/L (ref 15–41)
Alkaline Phosphatase: 81 U/L (ref 38–126)
Anion gap: 8 (ref 5–15)
BUN: 20 mg/dL (ref 8–23)
CO2: 27 mmol/L (ref 22–32)
CREATININE: 1.14 mg/dL (ref 0.61–1.24)
Calcium: 9.2 mg/dL (ref 8.9–10.3)
Chloride: 101 mmol/L (ref 98–111)
GFR calc Af Amer: >60 mL/min (ref >60)
GFR calc non Af Amer: >60 mL/min (ref >60)
GLUCOSE: 181 mg/dL — AB (ref 70–99)
POTASSIUM: 3.4 mmol/L — AB (ref 3.5–5.1)
SODIUM: 136 mmol/L (ref 135–145)
Total Bilirubin: 0.9 mg/dL (ref 0.3–1.2)
Total Protein: 6.8 g/dL (ref 6.5–8.1)

## 2018-07-25 LAB — MAGNESIUM: Magnesium: 2.1 mg/dL (ref 1.7–2.4)

## 2018-07-25 LAB — LACTIC ACID, PLASMA: Lactic Acid, Venous: 2.4 mmol/L (ref 0.5–1.9)

## 2018-07-25 MED ORDER — SODIUM CHLORIDE 0.9 % IV SOLN
INTRAVENOUS | Status: DC
  Administered 2018-07-25: 11:00:00 via INTRAVENOUS
  Filled 2018-07-25: qty 250

## 2018-07-25 MED ORDER — MORPHINE SULFATE 2 MG/ML IJ SOLN
2.0000 mg | Freq: Once | INTRAMUSCULAR | Status: AC
  Administered 2018-07-25: 2 mg via INTRAVENOUS
  Filled 2018-07-25: qty 1

## 2018-07-25 MED ORDER — DICYCLOMINE HCL 20 MG PO TABS: 20.0000 mg | ORAL_TABLET | Freq: Four times a day (QID) | ORAL | 0 refills | Status: DC

## 2018-07-25 NOTE — Progress Notes (Signed)
Symptom Management Consult note [facility]  [phone] [phone]  Patient Care Team: Dion Body, MD as PCP - General (Family Medicine) Jannet Mantis, MD (Dermatology) Bary Castilla, Forest Gleason, MD (General Surgery) Hollice Espy, MD as Consulting Physician (Urology) Cammie Sickle, MD as Medical Oncologist (Medical Oncology) Isaias Cowman, MD as Consulting Physician (Cardiology) Harriette Bouillon, MD as Referring Physician (Cardiology)   Name of the patient: Glen Davis  [id number]  Sep 18, 1941   Date of visit: 07/25/18  Diagnosis- kidney cancer  Chief complaint/ Reason for visit- worsening abdominal pain  Heme/Onc history: Patient was last seen by Dr. Rogue Bussing on 07/16/2018 for assessment.  Patient previously on immunotherapy (Opdivo). Last given in 2018. More recently started on Axitinib (Cabometyx) 40 mg daily.  Completed radiation on 9/62/9528 to hypermetabolic metastatic involvement of left iliac wing and L for vertebral body.  At that visit, he continued to complain of worsening abdominal pain, poor appetite and dizzy spells.  CT scan was recommended to rule out perforation versus diverticulitis.  He was instructed to hold Cabometyx and to start antidiarrheals.  CT scan revealed colitis.  He was started on Cipro and Flagyl on 07/24/2018.  Was seen in [facility] on 07/14/2018 for reports of diarrhea and abdominal pain.  C. difficile and GI panel were negative.  Had abdominal x-ray which was negative for obstruction or ileus.  Amylase and lipase were normal and LFTs were normal.  He was given 2 mg IV in clinic with improvement. Sent home with a prescription for oxycodone.  Oncology History   # MAY- June 2017- METASTATIC CLEAR CELL; LEFT RENAL CA/STAGE IV; [place]- G-3; INTERMEDIATE RISK;  [bil Pul lung nodules;incidental s/p Bx Dr.Byrnett/Dr.Oaks] s/p Cytoreductive nephrectomy; Dr.Brandon- [id number]; July 11th 2017 CT-  Enlarging Lung nodules  # Aug 7th 2017- PAZOPANIB; STOP SEP 2017 [pancreatitis/poor tol]  # OCT 1 week- START NIVO q 2W x4; DEC 8th CT- "Progression"; Continue Opdivo;APRIL-MAY 2018- STABLE LUNG NODULES [stopped sec to severe cough; NO Pneumonitis]  # May 31st 2018- [place] 60mg /day; July 31st CT lung- improved lung nodules; Sep 1st week- cabo- 40mg /day [dose reduced sec to mult intol]; Oct 1st 2week-Cabo 20mg /day;   # NOV 15th 2018- CT chest- slight progression of lung nodules [poor tol to higer doses];   # Nov 28th 2018- SUTENT 50 mg 2w-on  &1 w-OFF;  # MARCH 2019- Progression; start Axitinib 5 mg BID; July 2019- MIXED response; worsening/new skeletal lesions/stable to improved lung lesions; STOP Axitinib;   # Left hip s/p RT [mid-AUG 2019]  # AUG 12th- RE-START CABO 40 mg/day;   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  The pain is intermittent and random. No relation to bowel movements or foods/liquids. Describes spasmodic pain last up to 1 minute occurring over 10 times per day. He started his antibiotics as prescribed yesterday by Dr. Jacinto Reap.  Has had a total of 4 doses. Has been taking oxycodone 5-325 mg q 4 hours for pain with "some" relief. Has not been taking lomotil. Having 8-10  liquid non-bloody stools daily. Maintains a good appetite. Weight stable. Some nausea. No vomiting.  ECOG FS:1 - Symptomatic but completely ambulatory  Review of systems- Review of Systems  Constitutional:  Positive for malaise/fatigue. Negative for chills, fever and weight loss.  HENT: Negative for congestion and ear pain.   Eyes: Negative.  Negative for blurred vision and double vision.  Respiratory: Negative.  Negative for cough, sputum production and shortness of breath.   Cardiovascular: Negative.  Negative for chest pain, palpitations and leg swelling.  Gastrointestinal: Positive for abdominal pain, diarrhea (8-10 liquid stools) and nausea. Negative for constipation and vomiting.  Genitourinary: Negative for dysuria, frequency and urgency.  Musculoskeletal: Negative for back pain and falls.  Skin: Negative.  Negative for rash.  Neurological: Negative.  Negative for weakness and headaches.  Endo/Heme/Allergies: Negative.  Does not bruise/bleed easily.  Psychiatric/Behavioral: Negative.  Negative for depression. The patient is not nervous/anxious and does not have insomnia.     Current treatment- Cabometyx currently on hold d/t diarrhea/abdominal pain  Allergies  Allergen Reactions  . Lisinopril Swelling  . Other Itching    Nitroglycerin Patch.     Past Medical History:  Diagnosis Date  . CAD (coronary artery disease)   . Cancer ([place])    left arm  . COPD (chronic obstructive pulmonary disease) ([place])   . Hypertension   . Kidney stone   . Lung cancer ([place])   . Melanoma ([place]) 01/24/2016   right shoulder  . Thyroid nodule 02/02/2016   left BENIGN THYROID NODULE by FNA     Past Surgical History:  Procedure Laterality Date  . arm surgery Left    arm  . cardiac stents  2011   Angioplasty / Stenting Femoral-X2  . COLONOSCOPY  04/01/12  . CORONARY ANGIOPLASTY    . EXCISION MELANOMA WITH SENTINEL LYMPH NODE BIOPSY Right 01/24/2016   Procedure: EXCISION MELANOMA Right Shoulder;  Surgeon: Robert Bellow, MD;  Location: [facility] ORS;  Service: General;  Laterality: Right;  . LAPAROSCOPIC NEPHRECTOMY, HAND ASSISTED Left 04/24/2016   Procedure: HAND ASSISTED LAPAROSCOPIC NEPHRECTOMY;   Surgeon: Hollice Espy, MD;  Location: [facility] ORS;  Service: Urology;  Laterality: Left;  Marland Kitchen VIDEO ASSISTED THORACOSCOPY (VATS)/THOROCOTOMY Left 03/08/2016   Procedure: VIDEO ASSISTED THORACOSCOPY (VATS) with lung biopsy - Left ;  Surgeon: Robert Bellow, MD;  Location: [facility] ORS;  Service: General;  Laterality: Left;    Social History   Socioeconomic History  . Marital status: Married    Spouse name: Not on file  . Number of children: Not on file  . Years of education: Not on file  . Highest education level: Not on file  Occupational History  . Not on file  Social Needs  . Financial resource strain: Not on file  . Food insecurity:    Worry: Not on file    Inability: Not on file  . Transportation needs:    Medical: Not on file    Non-medical: Not on file  Tobacco Use  . Smoking status: Former Smoker    Packs/day: 2.00    Years: 30.00    Pack years: 60.00    Types: Cigarettes    Start date: 01/18/1956    Last attempt to quit: 01/17/1969    Years since quitting: 49.5  . Smokeless tobacco: Never Used  Substance and Sexual Activity  . Alcohol use: No    Alcohol/week: 0.0 standard drinks    Comment: BEER OCC  . Drug use: No  . Sexual activity:  Yes  Lifestyle  . Physical activity:    Days per week: Not on file    Minutes per session: Not on file  . Stress: Not on file  Relationships  . Social connections:    Talks on phone: Not on file    Gets together: Not on file    Attends religious service: Not on file    Active member of club or organization: Not on file    Attends meetings of clubs or organizations: Not on file    Relationship status: Not on file  . Intimate partner violence:    Fear of current or ex partner: Not on file    Emotionally abused: Not on file    Physically abused: Not on file    Forced sexual activity: Not on file  Other Topics Concern  . Not on file  Social History Narrative  . Not on file    Family History  Problem Relation Age of Onset  .  Hodgkin's lymphoma Daughter 5  . Heart attack Father   . Kidney cancer Neg Hx   . Kidney disease Neg Hx   . Prostate cancer Neg Hx      Current Outpatient Medications:  .  acetaminophen (TYLENOL) 500 MG tablet, Take 500 mg by mouth every 6 (six) hours as needed for moderate pain., Disp: , Rfl:  .  albuterol (PROVENTIL HFA;VENTOLIN HFA) 108 (90 Base) MCG/ACT inhaler, Inhale 2 puffs into the lungs every 6 (six) hours as needed for wheezing or shortness of breath., Disp: 1 Inhaler, Rfl: 2 .  aspirin EC 81 MG tablet, Take 81 mg by mouth daily., Disp: , Rfl:  .  atorvastatin (LIPITOR) 40 MG tablet, Take 40 mg by mouth daily., Disp: , Rfl:  .  busPIRone (BUSPAR) 5 MG tablet, Take 2 tablets by mouth daily., Disp: , Rfl:  .  ciprofloxacin (CIPRO) 500 MG tablet, Take 1 tablet (500 mg total) by mouth 2 (two) times daily., Disp: 14 tablet, Rfl: 0 .  clopidogrel (PLAVIX) 75 MG tablet, Take 75 mg by mouth daily., Disp: , Rfl:  .  dronabinol (MARINOL) 2.5 MG capsule, Take 1 capsule (2.5 mg total) by mouth 2 (two) times daily before a meal., Disp: 60 capsule, Rfl: 1 .  DULoxetine (CYMBALTA) 30 MG capsule, Take 2 capsules by mouth daily., Disp: , Rfl:  .  ferrous sulfate 325 (65 FE) MG EC tablet, Take 325 mg by mouth 3 (three) times daily with meals., Disp: , Rfl:  .  fluticasone (FLONASE) 50 MCG/ACT nasal spray, Place 2 sprays into both nostrils daily., Disp: 16 g, Rfl: 2 .  Fluticasone-Salmeterol (ADVAIR DISKUS) 500-50 MCG/DOSE AEPB, Inhale 1 puff into the lungs 2 (two) times daily., Disp: 60 each, Rfl: 11 .  ipratropium-albuterol (DUONEB) 0.5-2.5 (3) MG/3ML SOLN, Take 3 mLs by nebulization every 4 (four) hours as needed. (Patient not taking: Reported on 07/28/2018), Disp: 360 mL, Rfl: 3 .  loperamide (IMODIUM) 1 MG/5ML solution, Take 4 mg by mouth as needed for diarrhea or loose stools., Disp: , Rfl:  .  loratadine (CLARITIN) 10 MG tablet, Take 10 mg by mouth daily., Disp: , Rfl:  .  MELATONIN PO, Take 10  mg by mouth at bedtime. , Disp: , Rfl:  .  metroNIDAZOLE (FLAGYL) 500 MG tablet, Take 1 tablet (500 mg total) by mouth 3 (three) times daily., Disp: 21 tablet, Rfl: 0 .  Misc Natural Products (GLUCOSAMINE CHOND COMPLEX/MSM) TABS, Take 2 tablets by mouth daily., Disp: , Rfl:  .  montelukast (SINGULAIR) 10 MG tablet, TAKE 1 TABLET BY MOUTH EVERYDAY AT BEDTIME, Disp: 30 tablet, Rfl: 3 .  nitroGLYCERIN (NITROSTAT) 0.4 MG SL tablet, Place 0.4 mg under the tongue every 5 (five) minutes as needed. , Disp: , Rfl:  .  Omega-3 Fatty Acids (FISH OIL) 1000 MG CPDR, Take 1 capsule by mouth daily., Disp: , Rfl:  .  OXYGEN, Inhale 2 L into the lungs at bedtime., Disp: , Rfl:  .  pyridostigmine (MESTINON) 60 MG tablet, Take 1 tablet by mouth 2 (two) times daily., Disp: , Rfl:  .  tamsulosin (FLOMAX) 0.4 MG CAPS capsule, TAKE 1 CAPSULE (0.4 MG TOTAL) BY MOUTH EVERY MORNING., Disp: 30 capsule, Rfl: 3 .  Tiotropium Bromide Monohydrate (SPIRIVA RESPIMAT) 1.25 MCG/ACT AERS, Inhale 1.25 Act into the lungs 2 (two) times daily., Disp: 1 Inhaler, Rfl: 11 .  tiZANidine (ZANAFLEX) 4 MG tablet, Take 4 mg by mouth daily. , Disp: , Rfl:  .  dicyclomine (BENTYL) 20 MG tablet, Take 1 tablet (20 mg total) by mouth every 6 (six) hours., Disp: 30 tablet, Rfl: 0 .  diphenoxylate-atropine (LOMOTIL) 2.5-0.025 MG tablet, Take 2 tablets by mouth 4 (four) times daily as needed for diarrhea or loose stools., Disp: 30 tablet, Rfl: 0 .  docusate sodium (COLACE) 100 MG capsule, Take 1 capsule (100 mg total) by mouth 2 (two) times daily. (Patient not taking: Reported on 07/14/2018), Disp: 60 capsule, Rfl: 3 .  oxyCODONE (OXY IR/ROXICODONE) 5 MG immediate release tablet, Take 2 tablets (10 mg total) by mouth every 6 (six) hours as needed for severe pain., Disp: 30 tablet, Rfl: 0  Physical exam:  Vitals:   07/25/18 1014  BP: (!) 150/82  Pulse: 64  Resp: 18  Temp: (!) 97.5 F (36.4 C)  TempSrc: Tympanic  Weight: 202 lb (91.6 kg)    Physical Exam  Constitutional: He is oriented to person, place, and time. Vital signs are normal. He appears well-developed and well-nourished.  HENT:  Head: Normocephalic and atraumatic.  Eyes: Pupils are equal, round, and reactive to light.  Neck: Normal range of motion.  Cardiovascular: Normal rate, regular rhythm and normal heart sounds.  No murmur heard. Pulmonary/Chest: Effort normal and breath sounds normal. He has no wheezes.  Abdominal: Soft. Normal appearance. He exhibits no distension and no mass. Bowel sounds are increased. There is no splenomegaly or hepatomegaly. There is tenderness in the epigastric area and periumbilical area. There is no rigidity, no rebound and no CVA tenderness.  Musculoskeletal: Normal range of motion. He exhibits no edema.  Neurological: He is alert and oriented to person, place, and time.  Skin: Skin is warm and dry. No rash noted.  Psychiatric: Judgment normal.     CMP Latest Ref Rng & Units 07/25/2018  Glucose 70 - 99 mg/dL 181(H)  BUN 8 - 23 mg/dL 20  Creatinine 0.61 - 1.24 mg/dL 1.14  Sodium 135 - 145 mmol/L 136  Potassium 3.5 - 5.1 mmol/L 3.4(L)  Chloride 98 - 111 mmol/L 101  CO2 22 - 32 mmol/L 27  Calcium 8.9 - 10.3 mg/dL 9.2  Total Protein 6.5 - 8.1 g/dL 6.8  Total Bilirubin 0.3 - 1.2 mg/dL 0.9  Alkaline Phos 38 - 126 U/L 81  AST 15 - 41 U/L 28  ALT 0 - 44 U/L 26   CBC Latest Ref Rng & Units 07/28/2018  WBC 3.8 - 10.6 K/uL 5.3  Hemoglobin 13.0 - 18.0 g/dL 14.0  Hematocrit 40.0 - 52.0 % 39.7(L)  Platelets 150 - 440 K/uL 114(L)    No images are attached to the encounter.  Ct Abdomen Pelvis W Contrast  Result Date: 07/16/2018 CLINICAL DATA:  Metastatic melanoma. History of renal cell carcinoma, prior left nephrectomy. Generalized abdominal pain for 3 weeks. EXAM: CT ABDOMEN AND PELVIS WITH CONTRAST TECHNIQUE: Multidetector CT imaging of the abdomen and pelvis was performed using the standard protocol following bolus administration  of intravenous contrast. CONTRAST:  126mL ISOVUE-300 IOPAMIDOL (ISOVUE-300) INJECTION 61% COMPARISON:  PET-CT dated 06/11/2017 FINDINGS: Lower chest: Bibasilar pulmonary nodules are stable from 06/11/2018. No new nodules identified. Faint calcifications of the aortic valve. Hepatobiliary: Unremarkable Pancreas: Unremarkable Spleen: A peripheral 10 mm in diameter enhancing lesion in the spleen on image 32/2 is unchanged from 03/23/2016, and has not been hypermetabolic on interval PET-CT examinations. Adrenals/Urinary Tract: Adrenal glands unremarkable. Left oophorectomy without soft tissue mass along the nephrectomy bed. Stable right mid kidney cyst and several tiny hypodense lesions of the right kidney which are technically too small to characterize although statistically likely to be cysts. Urinary bladder unremarkable. Stomach/Bowel: Mild wall thickening in the descending and sigmoid colon with some faint surrounding stranding raising suspicion for colitis. Vascular/Lymphatic: Aortoiliac atherosclerotic vascular disease. No pathologic adenopathy is currently identified. Reproductive: Mild prostatomegaly. Other: No supplemental non-categorized findings. Musculoskeletal: Lytic metastatic lesion of the left iliac crest 4.7 cm in length, previously 4.3 cm by my measurement, with cortical destruction and soft tissue extension. New/progressive lytic metastatic lesion in the right iliac bone, 1.2 cm on image 67/2 with, with endosteal scalloping which is likewise new. The L4 lytic metastatic lesion measures 2.5 cm transverse, previously 2.1 cm. Schmorl's node versus lytic and sclerotic lesion in the L3 vertebral body. The T9 lytic lesion measures 2.9 by 2.4 cm, previously 2.5 by 2.2 cm. There is a lesion at T10 as well, and a small lesion at L2. Right and left lytic lesions are present at the S1 vertebral level. IMPRESSION: 1. Enlarging lytic metastatic lesions in the visualized spine and bony pelvis. Potential new lesion  in the right iliac bone. 2. Stable bilateral basilar pulmonary nodules. 3. Mild inflammatory findings in the descending and sigmoid colon compatible with colitis. 4. Other imaging findings of potential clinical significance: Chronically stable diffusely enhancing 10 mm lesion peripherally in the spleen. Aortoiliac atherosclerotic vascular disease. Right renal cyst. Faint aortic valve calcification. Mild prostatomegaly. Electronically Signed   By: Van Clines M.D.   On: 07/16/2018 14:11   Dg Abd 2 Views  Result Date: 07/28/2018 CLINICAL DATA:  Abdominal pain with diarrhea. EXAM: ABDOMEN - 2 VIEW COMPARISON:  CT scan dated 07/16/2018 and radiographs dated 07/14/2018 FINDINGS: The bowel gas pattern is normal. No visible free air or free fluid. Surgical clips and staples are present in the abdomen, unchanged. 5 cm lytic lesion in the superior aspect of the left iliac bone, unchanged. IMPRESSION: Benign-appearing abdomen with the exception of a lytic metastasis in the left iliac bone as previously described. Electronically Signed   By: Lorriane Shire M.D.   On: 07/28/2018 10:59   Dg Abd 2 Views  Result Date: 07/14/2018 CLINICAL DATA:  Abdominal pain.  History metastatic renal carcinoma. EXAM: ABDOMEN - 2 VIEW COMPARISON:  Images from a PET scan on 06/11/2018 FINDINGS: No evidence of acute bowel obstruction, significant ileus or free intraperitoneal air by x-ray. Clips are present related to prior left nephrectomy. No abnormal calcifications or visible bony abnormalities. IMPRESSION: No acute findings. Electronically Signed   By: Jenness Corner.D.  On: 07/14/2018 13:31   Assessment and plan- Patient is a 77 y.o. male who presents for continued abdominal pain.  Patient was recently diagnosed with colitis and started on Flagyl and Cipro yesterday.  Continues to have 8-10 loose stools along with intermittent severe abdominal pain.   1.  Kidney cancer: s/p imuntherapy (opdivo). Most recently on  Cabocentyx (oral). Stopped d/t worsening abdominal pain and diarrhea.  Scheduled to return to clinic on 08/01/2018 for further evaluation by Dr. Rogue Bussing.  2. Abdominal pain: Likely d/t recent diagnosis of colitis.  Should improve with antibiotics.  Will give prescription for Bentyl 20 mg every 6 hours for spasmodic abdominal pain.  We will give him 2 mg morphine through his IV while in clinic today.  Continue 5 mg oxycodone every 4 hours for pain.   3.  Diarrhea: He has not been taking Lomotil as prescribed.  He is having 8-10 loose stools daily.  Previous GI and C. difficile panel was negative.  We will give him 1 L NaCl today in clinic.  Instructed him to take Lomotil as prescribed and as needed for diarrhea.  4.  Dehydration: D/t diarrhea.  We will rehydrate as mentioned above.  5.  Elevated lactic acid: Currently afebrile.  Has had several low-grade temperatures over the past few days.  Lactic acid 2.4.  We will have him return to clinic on Monday for repeat labs.   6.  Thrombocytopenia: Likely d/t chemotherapy.  Continue to monitor.   Visit Diagnosis 1. Diarrhea, unspecified type   2. Cancer of left kidney excluding renal pelvis ([place])   3. Colitis   4. Left lower quadrant pain   5. Dehydration     Patient expressed understanding and was in agreement with this plan. He also understands that He can call clinic at any time with any questions, concerns, or complaints.   Greater than 50% was spent in counseling and coordination of care with this patient including but not limited to discussion of the relevant topics above (See A&P) including, but not limited to diagnosis and management of acute and chronic medical conditions.    Faythe Casa, AGNP-C [facility]- [phone] Pager- [phone] 07/29/2018 12:08 PM

## 2018-07-28 ENCOUNTER — Inpatient Hospital Stay (HOSPITAL_BASED_OUTPATIENT_CLINIC_OR_DEPARTMENT_OTHER): Payer: PPO | Admitting: Oncology

## 2018-07-28 ENCOUNTER — Ambulatory Visit
Admission: RE | Admit: 2018-07-28 | Discharge: 2018-07-28 | Disposition: A | Discharge status: Home / Self Care | Payer: PPO | Source: Ambulatory Visit | Attending: Oncology | Admitting: Oncology

## 2018-07-28 ENCOUNTER — Inpatient Hospital Stay: Payer: PPO

## 2018-07-28 ENCOUNTER — Encounter: Payer: Self-pay | Admitting: Oncology

## 2018-07-28 ENCOUNTER — Other Ambulatory Visit: Payer: Self-pay

## 2018-07-28 VITALS — BP 106/65 | HR 58 | Temp 96.0°F | Resp 22 | Ht 73.0 in | Wt 201.2 lb

## 2018-07-28 DIAGNOSIS — C642 Malignant neoplasm of left kidney, except renal pelvis: Secondary | ICD-10-CM

## 2018-07-28 DIAGNOSIS — D696 Thrombocytopenia, unspecified: Secondary | ICD-10-CM | POA: Diagnosis not present

## 2018-07-28 DIAGNOSIS — R1010 Upper abdominal pain, unspecified: Secondary | ICD-10-CM

## 2018-07-28 DIAGNOSIS — Z923 Personal history of irradiation: Secondary | ICD-10-CM | POA: Diagnosis not present

## 2018-07-28 DIAGNOSIS — R109 Unspecified abdominal pain: Secondary | ICD-10-CM | POA: Diagnosis not present

## 2018-07-28 DIAGNOSIS — R740 Nonspecific elevation of levels of transaminase and lactic acid dehydrogenase [LDH]: Secondary | ICD-10-CM

## 2018-07-28 DIAGNOSIS — C7951 Secondary malignant neoplasm of bone: Secondary | ICD-10-CM | POA: Diagnosis not present

## 2018-07-28 DIAGNOSIS — C652 Malignant neoplasm of left renal pelvis: Secondary | ICD-10-CM

## 2018-07-28 DIAGNOSIS — Z9221 Personal history of antineoplastic chemotherapy: Secondary | ICD-10-CM | POA: Diagnosis not present

## 2018-07-28 DIAGNOSIS — Z79899 Other long term (current) drug therapy: Secondary | ICD-10-CM | POA: Diagnosis not present

## 2018-07-28 DIAGNOSIS — R7989 Other specified abnormal findings of blood chemistry: Secondary | ICD-10-CM

## 2018-07-28 DIAGNOSIS — Z87891 Personal history of nicotine dependence: Secondary | ICD-10-CM | POA: Diagnosis not present

## 2018-07-28 DIAGNOSIS — K529 Noninfective gastroenteritis and colitis, unspecified: Secondary | ICD-10-CM

## 2018-07-28 DIAGNOSIS — R197 Diarrhea, unspecified: Secondary | ICD-10-CM

## 2018-07-28 LAB — LACTIC ACID, PLASMA: Lactic Acid, Venous: 1.6 mmol/L (ref 0.5–1.9)

## 2018-07-28 LAB — CBC
HCT: 39.7 % — ABNORMAL LOW (ref 40.0–52.0)
HEMOGLOBIN: 14 g/dL (ref 13.0–18.0)
MCH: 34.3 pg — AB (ref 26.0–34.0)
MCHC: 35.2 g/dL (ref 32.0–36.0)
MCV: 97.4 fL (ref 80.0–100.0)
Platelets: 114 10*3/uL — ABNORMAL LOW (ref 150–440)
RBC: 4.08 MIL/uL — ABNORMAL LOW (ref 4.40–5.90)
RDW: 17.6 % — ABNORMAL HIGH (ref 11.5–14.5)
WBC: 5.3 10*3/uL (ref 3.8–10.6)

## 2018-07-28 MED ORDER — OXYCODONE HCL 5 MG PO TABS: 10.0000 mg | ORAL_TABLET | Freq: Four times a day (QID) | ORAL | 0 refills | Status: DC | PRN

## 2018-07-28 NOTE — Progress Notes (Signed)
Symptom Management Consult note [facility]  [phone] [phone]  Patient Care Team: Dion Body, MD as PCP - General (Family Medicine) Jannet Mantis, MD (Dermatology) Bary Castilla, Forest Gleason, MD (General Surgery) Hollice Espy, MD as Consulting Physician (Urology) Cammie Sickle, MD as Medical Oncologist (Medical Oncology) Isaias Cowman, MD as Consulting Physician (Cardiology) Harriette Bouillon, MD as Referring Physician (Cardiology)   Name of the patient: Glen Davis  [id number]  07-16-1941   Date of visit: 07/28/18  Diagnosis- kidney cancer   Chief complaint/ Reason for visit- Re-check labs  Heme/Onc history: Patient seen in [facility] clinic on 07/25/2018 for complaints of abdominal pain and continued diarrhea.  He had recently been prescribed Flagyl and Cipro and started on 07/25/2018 for colitis.  He continued to have 8-10 loose stools daily with intermittent severe abdominal pain. He was started on Bentyl for spasm pain and encouraged to continue antibiotics as prescribed.  Labs revealed an elevated lactic acid at 2.4.  He was scheduled to return to clinic today for repeat lab work and further assessment.  Patient was last seen by Dr. Rogue Bussing on 07/16/2018 for assessment.  Patient previously on immunotherapy (Opdivo). Last given in 2018. More recently started on Axitinib (Cabometyx) 40 mg daily.  Completed radiation on 2/68/3419 to hypermetabolic metastatic involvement of left iliac wing and L for vertebral body.  At that visit, he continued to complain of worsening abdominal pain, poor appetite and dizzy spells.  CT scan was recommended to rule out perforation versus diverticulitis.  He was instructed to hold Cabometyx and to start antidiarrheals.  CT scan revealed colitis.  He was started on Cipro and Flagyl on 07/24/2018.  Was seen in [facility] on 07/14/2018 for reports of diarrhea and abdominal pain.  C. difficile and GI  panel were negative.  Had abdominal x-ray which was negative for obstruction or ileus.  Amylase and lipase were normal and LFTs were normal.  He was given 2 mg IV in clinic with improvement. Sent home with a prescription for oxycodone.  Oncology History   # MAY- June 2017- METASTATIC CLEAR CELL; LEFT RENAL CA/STAGE IV; [place]- G-3; INTERMEDIATE RISK;  [bil Pul lung nodules;incidental s/p Bx Dr.Byrnett/Dr.Oaks] s/p Cytoreductive nephrectomy; Dr.Brandon- [id number]; July 11th 2017 CT- Enlarging Lung nodules  # Aug 7th 2017- PAZOPANIB; STOP SEP 2017 [pancreatitis/poor tol]  # OCT 1 week- START NIVO q 2W x4; DEC 8th CT- "Progression"; Continue Opdivo;APRIL-MAY 2018- STABLE LUNG NODULES [stopped sec to severe cough; NO Pneumonitis]  # May 31st 2018- [place] 60mg /day; July 31st CT lung- improved lung nodules; Sep 1st week- cabo- 40mg /day [dose reduced sec to mult intol]; Oct 1st 2week-Cabo 20mg /day;   # NOV 15th 2018- CT chest- slight progression of lung nodules [poor tol to higer doses];   # Nov 28th 2018- SUTENT 50 mg 2w-on  &1 w-OFF;  # MARCH 2019- Progression; start Axitinib 5 mg BID; July 2019- MIXED response; worsening/new skeletal lesions/stable to improved lung lesions; STOP Axitinib;   # Left hip s/p RT [mid-AUG 2019]  # AUG 12th- RE-START CABO 40 mg/day;   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 The pain is described as shooting and stabbing, and is 8/10 in intensity. Pain is located in the epigastric  without radiation. Onset was 3 weeks ago. Symptoms have been gradually worsening since. Aggravating factors: none.  Alleviating factors: none. Associated symptoms: diarrhea. The patient denies chills, diarrhea, dysuria, fever, nausea, sweats and vomiting.  States Bentyl and 5 mg oxycodone every 4 hours has not helped.   ECOG FS:1 - Symptomatic but completely ambulatory  Review of systems- Review of Systems  Constitutional: Negative.  Negative for chills, fever, malaise/fatigue and weight loss.  HENT: Negative for congestion and ear pain.   Eyes: Negative for blurred vision and double vision.  Respiratory: Negative.  Negative for cough, sputum production and shortness of breath.   Cardiovascular: Negative.  Negative for chest pain, palpitations and leg swelling.  Gastrointestinal: Positive for abdominal pain and diarrhea. Negative for constipation, nausea and vomiting.  Genitourinary: Negative for dysuria, frequency and urgency.  Musculoskeletal: Negative for back pain and falls.  Skin: Negative.  Negative for rash.  Neurological: Negative.  Negative for weakness and headaches.  Endo/Heme/Allergies: Negative.  Does not bruise/bleed easily.  Psychiatric/Behavioral: Negative.  Negative for depression. The patient is not nervous/anxious and does not have insomnia.      Current treatment- s/p cabometyx currently on hold d/t diarrhea/abdominal pain  Allergies  Allergen Reactions  . Lisinopril Swelling  . Other Itching    Nitroglycerin Patch.     Past Medical History:  Diagnosis Date  . CAD (coronary artery disease)   . Cancer ([place])    left arm  . COPD (chronic obstructive pulmonary disease) ([place])   . Hypertension   . Kidney stone   . Lung cancer ([place])   . Melanoma ([place]) 01/24/2016   right shoulder  . Thyroid nodule 02/02/2016   left BENIGN THYROID NODULE by FNA     Past Surgical History:  Procedure Laterality Date  . arm surgery Left    arm  . cardiac stents  2011    Angioplasty / Stenting Femoral-X2  . COLONOSCOPY  04/01/12  . CORONARY ANGIOPLASTY    . EXCISION MELANOMA WITH SENTINEL LYMPH NODE BIOPSY Right 01/24/2016   Procedure: EXCISION MELANOMA Right Shoulder;  Surgeon: Robert Bellow, MD;  Location: [facility] ORS;  Service: General;  Laterality: Right;  . LAPAROSCOPIC NEPHRECTOMY, HAND ASSISTED Left 04/24/2016   Procedure: HAND ASSISTED LAPAROSCOPIC NEPHRECTOMY;  Surgeon: Hollice Espy, MD;  Location: [facility] ORS;  Service: Urology;  Laterality: Left;  Marland Kitchen VIDEO ASSISTED THORACOSCOPY (VATS)/THOROCOTOMY Left 03/08/2016   Procedure: VIDEO ASSISTED THORACOSCOPY (VATS) with lung biopsy - Left ;  Surgeon: Robert Bellow, MD;  Location: [facility] ORS;  Service: General;  Laterality: Left;    Social History   Socioeconomic History  . Marital status: Married    Spouse name: Not on file  . Number of children: Not on file  . Years of education: Not on file  . Highest education level: Not on file  Occupational History  . Not on file  Social Needs  . Financial resource strain: Not on file  . Food insecurity:    Worry: Not on file    Inability: Not on file  . Transportation needs:    Medical: Not on file    Non-medical: Not on file  Tobacco Use  . Smoking status: Former Smoker    Packs/day: 2.00    Years: 30.00    Pack years:  60.00    Types: Cigarettes    Start date: 01/18/1956    Last attempt to quit: 01/17/1969    Years since quitting: 49.5  . Smokeless tobacco: Never Used  Substance and Sexual Activity  . Alcohol use: No    Alcohol/week: 0.0 standard drinks    Comment: BEER OCC  . Drug use: No  . Sexual activity: Yes  Lifestyle  . Physical activity:    Days per week: Not on file    Minutes per session: Not on file  . Stress: Not on file  Relationships  . Social connections:    Talks on phone: Not on file    Gets together: Not on file    Attends religious service: Not on file    Active member of club or organization: Not on file    Attends  meetings of clubs or organizations: Not on file    Relationship status: Not on file  . Intimate partner violence:    Fear of current or ex partner: Not on file    Emotionally abused: Not on file    Physically abused: Not on file    Forced sexual activity: Not on file  Other Topics Concern  . Not on file  Social History Narrative  . Not on file    Family History  Problem Relation Age of Onset  . Hodgkin's lymphoma Daughter 5  . Heart attack Father   . Kidney cancer Neg Hx   . Kidney disease Neg Hx   . Prostate cancer Neg Hx      Current Outpatient Medications:  .  acetaminophen (TYLENOL) 500 MG tablet, Take 500 mg by mouth every 6 (six) hours as needed for moderate pain., Disp: , Rfl:  .  albuterol (PROVENTIL HFA;VENTOLIN HFA) 108 (90 Base) MCG/ACT inhaler, Inhale 2 puffs into the lungs every 6 (six) hours as needed for wheezing or shortness of breath., Disp: 1 Inhaler, Rfl: 2 .  aspirin EC 81 MG tablet, Take 81 mg by mouth daily., Disp: , Rfl:  .  atorvastatin (LIPITOR) 40 MG tablet, Take 40 mg by mouth daily., Disp: , Rfl:  .  busPIRone (BUSPAR) 5 MG tablet, Take 2 tablets by mouth daily., Disp: , Rfl:  .  ciprofloxacin (CIPRO) 500 MG tablet, Take 1 tablet (500 mg total) by mouth 2 (two) times daily., Disp: 14 tablet, Rfl: 0 .  clopidogrel (PLAVIX) 75 MG tablet, Take 75 mg by mouth daily., Disp: , Rfl:  .  dicyclomine (BENTYL) 20 MG tablet, Take 1 tablet (20 mg total) by mouth every 6 (six) hours., Disp: 30 tablet, Rfl: 0 .  diphenoxylate-atropine (LOMOTIL) 2.5-0.025 MG tablet, Take 2 tablets by mouth 4 (four) times daily as needed for diarrhea or loose stools., Disp: 30 tablet, Rfl: 0 .  dronabinol (MARINOL) 2.5 MG capsule, Take 1 capsule (2.5 mg total) by mouth 2 (two) times daily before a meal., Disp: 60 capsule, Rfl: 1 .  DULoxetine (CYMBALTA) 30 MG capsule, Take 2 capsules by mouth daily., Disp: , Rfl:  .  fluticasone (FLONASE) 50 MCG/ACT nasal spray, Place 2 sprays into both  nostrils daily., Disp: 16 g, Rfl: 2 .  Fluticasone-Salmeterol (ADVAIR DISKUS) 500-50 MCG/DOSE AEPB, Inhale 1 puff into the lungs 2 (two) times daily., Disp: 60 each, Rfl: 11 .  loperamide (IMODIUM) 1 MG/5ML solution, Take 4 mg by mouth as needed for diarrhea or loose stools., Disp: , Rfl:  .  loratadine (CLARITIN) 10 MG tablet, Take 10 mg by mouth daily.,  Disp: , Rfl:  .  MELATONIN PO, Take 10 mg by mouth at bedtime. , Disp: , Rfl:  .  metroNIDAZOLE (FLAGYL) 500 MG tablet, Take 1 tablet (500 mg total) by mouth 3 (three) times daily., Disp: 21 tablet, Rfl: 0 .  Misc Natural Products (GLUCOSAMINE CHOND COMPLEX/MSM) TABS, Take 2 tablets by mouth daily., Disp: , Rfl:  .  montelukast (SINGULAIR) 10 MG tablet, TAKE 1 TABLET BY MOUTH EVERYDAY AT BEDTIME, Disp: 30 tablet, Rfl: 3 .  nitroGLYCERIN (NITROSTAT) 0.4 MG SL tablet, Place 0.4 mg under the tongue every 5 (five) minutes as needed. , Disp: , Rfl:  .  Omega-3 Fatty Acids (FISH OIL) 1000 MG CPDR, Take 1 capsule by mouth daily., Disp: , Rfl:  .  oxyCODONE (OXY IR/ROXICODONE) 5 MG immediate release tablet, Take 2 tablets (10 mg total) by mouth every 6 (six) hours as needed for severe pain., Disp: 30 tablet, Rfl: 0 .  OXYGEN, Inhale 2 L into the lungs at bedtime., Disp: , Rfl:  .  pyridostigmine (MESTINON) 60 MG tablet, Take 1 tablet by mouth 2 (two) times daily., Disp: , Rfl:  .  tamsulosin (FLOMAX) 0.4 MG CAPS capsule, TAKE 1 CAPSULE (0.4 MG TOTAL) BY MOUTH EVERY MORNING., Disp: 30 capsule, Rfl: 3 .  Tiotropium Bromide Monohydrate (SPIRIVA RESPIMAT) 1.25 MCG/ACT AERS, Inhale 1.25 Act into the lungs 2 (two) times daily., Disp: 1 Inhaler, Rfl: 11 .  tiZANidine (ZANAFLEX) 4 MG tablet, Take 4 mg by mouth daily. , Disp: , Rfl:  .  docusate sodium (COLACE) 100 MG capsule, Take 1 capsule (100 mg total) by mouth 2 (two) times daily. (Patient not taking: Reported on 07/14/2018), Disp: 60 capsule, Rfl: 3 .  ferrous sulfate 325 (65 FE) MG EC tablet, Take 325 mg by  mouth 3 (three) times daily with meals., Disp: , Rfl:  .  ipratropium-albuterol (DUONEB) 0.5-2.5 (3) MG/3ML SOLN, Take 3 mLs by nebulization every 4 (four) hours as needed. (Patient not taking: Reported on 07/28/2018), Disp: 360 mL, Rfl: 3  Physical exam:  Vitals:   07/28/18 0943  BP: 106/65  Pulse: (!) 58  Resp: (!) 22  Temp: (!) 96 F (35.6 C)  TempSrc: Tympanic  Weight: 201 lb 3.2 oz (91.3 kg)  Height: 6\' 1"  (1.854 m)   Physical Exam  Constitutional: He is oriented to person, place, and time. Vital signs are normal. He appears well-developed and well-nourished.  HENT:  Head: Normocephalic and atraumatic.  Eyes: Pupils are equal, round, and reactive to light.  Neck: Normal range of motion.  Cardiovascular: Normal rate, regular rhythm and normal heart sounds.  No murmur heard. Pulmonary/Chest: Effort normal and breath sounds normal. He has no wheezes.  Abdominal: Soft. Normal appearance and bowel sounds are normal. He exhibits no distension. There is no tenderness.  Nonreproducible abdominal pain.  Musculoskeletal: Normal range of motion. He exhibits no edema.  Neurological: He is alert and oriented to person, place, and time.  Skin: Skin is warm and dry. No rash noted.  Psychiatric: Judgment normal.     CMP Latest Ref Rng & Units 07/25/2018  Glucose 70 - 99 mg/dL 181(H)  BUN 8 - 23 mg/dL 20  Creatinine 0.61 - 1.24 mg/dL 1.14  Sodium 135 - 145 mmol/L 136  Potassium 3.5 - 5.1 mmol/L 3.4(L)  Chloride 98 - 111 mmol/L 101  CO2 22 - 32 mmol/L 27  Calcium 8.9 - 10.3 mg/dL 9.2  Total Protein 6.5 - 8.1 g/dL 6.8  Total Bilirubin 0.3 -  1.2 mg/dL 0.9  Alkaline Phos 38 - 126 U/L 81  AST 15 - 41 U/L 28  ALT 0 - 44 U/L 26   CBC Latest Ref Rng & Units 07/28/2018  WBC 3.8 - 10.6 K/uL 5.3  Hemoglobin 13.0 - 18.0 g/dL 14.0  Hematocrit 40.0 - 52.0 % 39.7(L)  Platelets 150 - 440 K/uL 114(L)    No images are attached to the encounter.  Ct Abdomen Pelvis W Contrast  Result Date:  07/16/2018 CLINICAL DATA:  Metastatic melanoma. History of renal cell carcinoma, prior left nephrectomy. Generalized abdominal pain for 3 weeks. EXAM: CT ABDOMEN AND PELVIS WITH CONTRAST TECHNIQUE: Multidetector CT imaging of the abdomen and pelvis was performed using the standard protocol following bolus administration of intravenous contrast. CONTRAST:  136mL ISOVUE-300 IOPAMIDOL (ISOVUE-300) INJECTION 61% COMPARISON:  PET-CT dated 06/11/2017 FINDINGS: Lower chest: Bibasilar pulmonary nodules are stable from 06/11/2018. No new nodules identified. Faint calcifications of the aortic valve. Hepatobiliary: Unremarkable Pancreas: Unremarkable Spleen: A peripheral 10 mm in diameter enhancing lesion in the spleen on image 32/2 is unchanged from 03/23/2016, and has not been hypermetabolic on interval PET-CT examinations. Adrenals/Urinary Tract: Adrenal glands unremarkable. Left oophorectomy without soft tissue mass along the nephrectomy bed. Stable right mid kidney cyst and several tiny hypodense lesions of the right kidney which are technically too small to characterize although statistically likely to be cysts. Urinary bladder unremarkable. Stomach/Bowel: Mild wall thickening in the descending and sigmoid colon with some faint surrounding stranding raising suspicion for colitis. Vascular/Lymphatic: Aortoiliac atherosclerotic vascular disease. No pathologic adenopathy is currently identified. Reproductive: Mild prostatomegaly. Other: No supplemental non-categorized findings. Musculoskeletal: Lytic metastatic lesion of the left iliac crest 4.7 cm in length, previously 4.3 cm by my measurement, with cortical destruction and soft tissue extension. New/progressive lytic metastatic lesion in the right iliac bone, 1.2 cm on image 67/2 with, with endosteal scalloping which is likewise new. The L4 lytic metastatic lesion measures 2.5 cm transverse, previously 2.1 cm. Schmorl's node versus lytic and sclerotic lesion in the L3  vertebral body. The T9 lytic lesion measures 2.9 by 2.4 cm, previously 2.5 by 2.2 cm. There is a lesion at T10 as well, and a small lesion at L2. Right and left lytic lesions are present at the S1 vertebral level. IMPRESSION: 1. Enlarging lytic metastatic lesions in the visualized spine and bony pelvis. Potential new lesion in the right iliac bone. 2. Stable bilateral basilar pulmonary nodules. 3. Mild inflammatory findings in the descending and sigmoid colon compatible with colitis. 4. Other imaging findings of potential clinical significance: Chronically stable diffusely enhancing 10 mm lesion peripherally in the spleen. Aortoiliac atherosclerotic vascular disease. Right renal cyst. Faint aortic valve calcification. Mild prostatomegaly. Electronically Signed   By: Van Clines M.D.   On: 07/16/2018 14:11   Dg Abd 2 Views  Result Date: 07/28/2018 CLINICAL DATA:  Abdominal pain with diarrhea. EXAM: ABDOMEN - 2 VIEW COMPARISON:  CT scan dated 07/16/2018 and radiographs dated 07/14/2018 FINDINGS: The bowel gas pattern is normal. No visible free air or free fluid. Surgical clips and staples are present in the abdomen, unchanged. 5 cm lytic lesion in the superior aspect of the left iliac bone, unchanged. IMPRESSION: Benign-appearing abdomen with the exception of a lytic metastasis in the left iliac bone as previously described. Electronically Signed   By: Lorriane Shire M.D.   On: 07/28/2018 10:59   Dg Abd 2 Views  Result Date: 07/14/2018 CLINICAL DATA:  Abdominal pain.  History metastatic renal carcinoma. EXAM:  ABDOMEN - 2 VIEW COMPARISON:  Images from a PET scan on 06/11/2018 FINDINGS: No evidence of acute bowel obstruction, significant ileus or free intraperitoneal air by x-ray. Clips are present related to prior left nephrectomy. No abnormal calcifications or visible bony abnormalities. IMPRESSION: No acute findings. Electronically Signed   By: Aletta Edouard M.D.   On: 07/14/2018 13:31      Assessment and plan- Patient is a 77 y.o. male who presents for repeat lab check and continued epigastric abdominal pain X 3 weeks.  1.  Kidney cancer: Status post immunotherapy (Opdivo).  Most recently on Cosentyx (oral).  Has recently discontinued d/t abdominal pain and diarrhea. Currently treatment on hold.  Scheduled to return to clinic on 08/01/2018 for reassessment by Dr. Rogue Bussing.  2. Abdominal pain: Ongoing. Recently treated for colitis with Flagyl and Cipro.  He is afebrile. States that Bentyl is not helping.  Currently taking 5 mg oxycodone every 4 hours as needed for pain.  No relief.  Will get stat abdominal x-ray to rule out obstruction/acute abnormality. Results:  Abdominal x-ray revealed no acute abnormalities.  Spoke with Dr. Rogue Bussing, and will increase oxycodone from 5 mg every 4 hours to 10 mg every 6 hours as needed for severe pain.  Patient and wife in agreement with plan.  Scheduled to see Dr. Rogue Bussing on Friday when to be reassessed.  3.  Diarrhea: Seems to be much better after beginning antibiotics.  Previously having 8-10 loose bowel movements daily and now having 1-2.  Continue Lomotil as prescribed.   4.  Thrombocytopenia: Likely d/t chemotherapy.  Denies any bleeding.  Will continue to monitor.  5.  Elevated lactic acid: Lactic acid returned to normal today.  Denies any further fevers.  Has been on antibiotics for a total of 4 days.  Continue to monitor.   Visit Diagnosis 1. Pain of upper abdomen   2. Diarrhea, unspecified type   3. Cancer of left kidney excluding renal pelvis ([place])   4. Thrombocytopenia ([place])     Patient expressed understanding and was in agreement with this plan. He also understands that He can call clinic at any time with any questions, concerns, or complaints.   Greater than 50% was spent in counseling and coordination of care with this patient including but not limited to discussion of the relevant topics above (See A&P) including,  but not limited to diagnosis and management of acute and chronic medical conditions.    Faythe Casa, AGNP-C [facility]- [phone] Pager- [phone] 07/29/2018 8:21 AM

## 2018-08-01 ENCOUNTER — Inpatient Hospital Stay (HOSPITAL_BASED_OUTPATIENT_CLINIC_OR_DEPARTMENT_OTHER): Payer: PPO | Admitting: Internal Medicine

## 2018-08-01 ENCOUNTER — Other Ambulatory Visit: Payer: Self-pay

## 2018-08-01 VITALS — BP 143/76 | HR 76 | Temp 95.2°F | Resp 18 | Wt 200.0 lb

## 2018-08-01 DIAGNOSIS — Z923 Personal history of irradiation: Secondary | ICD-10-CM

## 2018-08-01 DIAGNOSIS — R109 Unspecified abdominal pain: Secondary | ICD-10-CM

## 2018-08-01 DIAGNOSIS — M899 Disorder of bone, unspecified: Secondary | ICD-10-CM | POA: Diagnosis not present

## 2018-08-01 DIAGNOSIS — R3912 Poor urinary stream: Secondary | ICD-10-CM

## 2018-08-01 DIAGNOSIS — C652 Malignant neoplasm of left renal pelvis: Secondary | ICD-10-CM | POA: Diagnosis not present

## 2018-08-01 DIAGNOSIS — Z79899 Other long term (current) drug therapy: Secondary | ICD-10-CM | POA: Diagnosis not present

## 2018-08-01 DIAGNOSIS — Z9221 Personal history of antineoplastic chemotherapy: Secondary | ICD-10-CM

## 2018-08-01 DIAGNOSIS — Z87891 Personal history of nicotine dependence: Secondary | ICD-10-CM | POA: Diagnosis not present

## 2018-08-01 DIAGNOSIS — C642 Malignant neoplasm of left kidney, except renal pelvis: Secondary | ICD-10-CM

## 2018-08-01 DIAGNOSIS — I951 Orthostatic hypotension: Secondary | ICD-10-CM

## 2018-08-01 DIAGNOSIS — C7800 Secondary malignant neoplasm of unspecified lung: Secondary | ICD-10-CM | POA: Diagnosis not present

## 2018-08-01 NOTE — Progress Notes (Signed)
[facility] OFFICE PROGRESS NOTE  Patient Care Team: Dion Body, MD as PCP - General (Family Medicine) Jannet Mantis, MD (Dermatology) Bary Castilla, Forest Gleason, MD (General Surgery) Hollice Espy, MD as Consulting Physician (Urology) Cammie Sickle, MD as Medical Oncologist (Medical Oncology) Isaias Cowman, MD as Consulting Physician (Cardiology) Harriette Bouillon, MD as Referring Physician (Cardiology)  Cancer Staging No matching staging information was found for the patient.   Oncology History   # MAY- June 2017- METASTATIC CLEAR CELL; LEFT RENAL CA/STAGE IV; [place]- G-3; INTERMEDIATE RISK;  [bil Pul lung nodules;incidental s/p Bx Dr.Byrnett/Dr.Oaks] s/p Cytoreductive nephrectomy; Dr.Brandon- [id number]; July 11th 2017 CT- Enlarging Lung nodules  # Aug 7th 2017- PAZOPANIB; STOP SEP 2017 [pancreatitis/poor tol]  # OCT 1 week- START NIVO q 2W x4; DEC 8th CT- "Progression"; Continue Opdivo;APRIL-MAY 2018- STABLE LUNG NODULES [stopped sec to severe cough; NO Pneumonitis]  # May 31st 2018- [place] 60mg /day; July 31st CT lung- improved lung nodules; Sep 1st week- cabo- 40mg /day [dose reduced sec to mult intol]; Oct 1st 2week-Cabo 20mg /day;   # NOV 15th 2018- CT chest- slight progression of lung nodules [poor tol to higer doses];   # Nov 28th 2018- SUTENT 50 mg 2w-on  &1 w-OFF;  # MARCH 2019- Progression; start Axitinib 5 mg BID; July 2019- MIXED response; worsening/new skeletal lesions/stable to improved lung lesions; STOP Axitinib;   # Left hip s/p RT [mid-AUG 2019]  # AUG 12th- RE-START CABO 40 mg/day;   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  male pleasant patient above history of metastatic renal cell cancer currently on cabo is here for follow-up.  Unfortunately patient continues to abdominal pain-periumbilical over the last 4 weeks or so.  Each episode lasts for about 5 minutes; without any obvious precipitating factors or relieving factors.  Spasmodic.  He is taking hydro-codon without any significant benefit.  Also complains of difficulty in urination/weak stream.  Diarrhea is improved.  No nausea vomiting.  Review of Systems  Constitutional: Positive for malaise/fatigue and weight loss. Negative for chills, diaphoresis and fever.  HENT: Negative for nosebleeds and sore throat.   Eyes: Negative for double vision.  Respiratory: Negative for cough, hemoptysis, sputum production, shortness of breath and wheezing.   Cardiovascular: Negative for chest pain, palpitations, orthopnea and leg swelling.  Gastrointestinal: Positive for abdominal pain. Negative for blood in stool, constipation, heartburn, melena and vomiting.  Genitourinary: Negative for dysuria, frequency and urgency.  Musculoskeletal: Negative for joint pain.  Skin: Negative.  Negative for itching and rash.  Neurological: Positive for dizziness. Negative for tingling, focal weakness, weakness and headaches.  Endo/Heme/Allergies: Does not bruise/bleed easily.  Psychiatric/Behavioral: Negative for depression. The patient is not nervous/anxious and does not have insomnia.       PAST MEDICAL HISTORY :  Past Medical History:  Diagnosis Date  . CAD (coronary artery disease)   . Cancer ([place])    left arm  . COPD (chronic obstructive pulmonary disease) ([place])   . Hypertension   . Kidney stone   . Lung cancer ([place])   .  Melanoma ([place]) 01/24/2016   right shoulder  . Thyroid nodule 02/02/2016   left BENIGN  THYROID NODULE by FNA    PAST SURGICAL HISTORY :   Past Surgical History:  Procedure Laterality Date  . arm surgery Left    arm  . cardiac stents  2011   Angioplasty / Stenting Femoral-X2  . COLONOSCOPY  04/01/12  . CORONARY ANGIOPLASTY    . EXCISION MELANOMA WITH SENTINEL LYMPH NODE BIOPSY Right 01/24/2016   Procedure: EXCISION MELANOMA Right Shoulder;  Surgeon: Robert Bellow, MD;  Location: [facility] ORS;  Service: General;  Laterality: Right;  . LAPAROSCOPIC NEPHRECTOMY, HAND ASSISTED Left 04/24/2016   Procedure: HAND ASSISTED LAPAROSCOPIC NEPHRECTOMY;  Surgeon: Hollice Espy, MD;  Location: [facility] ORS;  Service: Urology;  Laterality: Left;  Marland Kitchen VIDEO ASSISTED THORACOSCOPY (VATS)/THOROCOTOMY Left 03/08/2016   Procedure: VIDEO ASSISTED THORACOSCOPY (VATS) with lung biopsy - Left ;  Surgeon: Robert Bellow, MD;  Location: [facility] ORS;  Service: General;  Laterality: Left;    FAMILY HISTORY :   Family History  Problem Relation Age of Onset  . Hodgkin's lymphoma Daughter 5  . Heart attack Father   . Kidney cancer Neg Hx   . Kidney disease Neg Hx   . Prostate cancer Neg Hx     SOCIAL HISTORY:   Social History   Tobacco Use  . Smoking status: Former Smoker    Packs/day: 2.00    Years: 30.00    Pack years: 60.00    Types: Cigarettes    Start date: 01/18/1956    Last attempt to quit: 01/17/1969    Years since quitting: 49.5  . Smokeless tobacco: Never Used  Substance Use Topics  . Alcohol use: No    Alcohol/week: 0.0 standard drinks    Comment: BEER OCC  . Drug use: No    ALLERGIES:  is allergic to lisinopril and other.  MEDICATIONS:  Current Outpatient Medications  Medication Sig Dispense Refill  . aspirin EC 81 MG tablet Take 81 mg by mouth daily.    Marland Kitchen atorvastatin (LIPITOR) 40 MG tablet Take 40 mg by mouth daily.    . busPIRone (BUSPAR) 5 MG tablet Take 2 tablets by mouth daily.    . clopidogrel (PLAVIX) 75 MG tablet Take 75 mg by mouth daily.    Marland Kitchen dicyclomine (BENTYL) 20  MG tablet Take 1 tablet (20 mg total) by mouth every 6 (six) hours. 30 tablet 0  . diphenoxylate-atropine (LOMOTIL) 2.5-0.025 MG tablet Take 2 tablets by mouth 4 (four) times daily as needed for diarrhea or loose stools. 30 tablet 0  . dronabinol (MARINOL) 2.5 MG capsule Take 1 capsule (2.5 mg total) by mouth 2 (two) times daily before a meal. 60 capsule 1  . DULoxetine (CYMBALTA) 30 MG capsule Take 2 capsules by mouth daily.    . ferrous sulfate 325 (65 FE) MG EC tablet Take 325 mg by mouth 3 (three) times daily with meals.    . fluticasone (FLONASE) 50 MCG/ACT nasal spray Place 2 sprays into both nostrils daily. 16 g 2  . Fluticasone-Salmeterol (ADVAIR DISKUS) 500-50 MCG/DOSE AEPB Inhale 1 puff into the lungs 2 (two) times daily. 60 each 11  . ipratropium-albuterol (DUONEB) 0.5-2.5 (3) MG/3ML SOLN Take 3 mLs by nebulization every 4 (four) hours as needed. 360 mL 3  . loperamide (IMODIUM) 1 MG/5ML solution Take 4 mg by mouth as needed for diarrhea or loose stools.    Marland Kitchen loratadine (CLARITIN) 10 MG tablet Take 10 mg by  mouth daily.    Marland Kitchen MELATONIN PO Take 10 mg by mouth at bedtime.     . metroNIDAZOLE (FLAGYL) 500 MG tablet Take 1 tablet (500 mg total) by mouth 3 (three) times daily. 21 tablet 0  . Misc Natural Products (GLUCOSAMINE CHOND COMPLEX/MSM) TABS Take 2 tablets by mouth daily.    . montelukast (SINGULAIR) 10 MG tablet TAKE 1 TABLET BY MOUTH EVERYDAY AT BEDTIME 30 tablet 3  . Omega-3 Fatty Acids (FISH OIL) 1000 MG CPDR Take 1 capsule by mouth daily.    . OXYGEN Inhale 2 L into the lungs at bedtime.    . pyridostigmine (MESTINON) 60 MG tablet Take 1 tablet by mouth 2 (two) times daily.    . tamsulosin (FLOMAX) 0.4 MG CAPS capsule TAKE 1 CAPSULE (0.4 MG TOTAL) BY MOUTH EVERY MORNING. 30 capsule 3  . Tiotropium Bromide Monohydrate (SPIRIVA RESPIMAT) 1.25 MCG/ACT AERS Inhale 1.25 Act into the lungs 2 (two) times daily. 1 Inhaler 11  . tiZANidine (ZANAFLEX) 4 MG tablet Take 4 mg by mouth daily.      Marland Kitchen acetaminophen (TYLENOL) 500 MG tablet Take 500 mg by mouth every 6 (six) hours as needed for moderate pain.    Marland Kitchen albuterol (PROVENTIL HFA;VENTOLIN HFA) 108 (90 Base) MCG/ACT inhaler Inhale 2 puffs into the lungs every 6 (six) hours as needed for wheezing or shortness of breath. (Patient not taking: Reported on 08/01/2018) 1 Inhaler 2  . docusate sodium (COLACE) 100 MG capsule Take 1 capsule (100 mg total) by mouth 2 (two) times daily. (Patient not taking: Reported on 08/01/2018) 60 capsule 3  . nitroGLYCERIN (NITROSTAT) 0.4 MG SL tablet Place 0.4 mg under the tongue every 5 (five) minutes as needed.     Marland Kitchen oxyCODONE (OXY IR/ROXICODONE) 5 MG immediate release tablet Take 2 tablets (10 mg total) by mouth every 6 (six) hours as needed for severe pain. (Patient not taking: Reported on 08/01/2018) 30 tablet 0   No current facility-administered medications for this visit.     PHYSICAL EXAMINATION: ECOG PERFORMANCE STATUS: 1 - Symptomatic but completely ambulatory  BP (!) 143/76 (BP Location: Left Arm, Patient Position: Sitting)   Pulse 76   Temp (!) 95.2 F (35.1 C) (Tympanic)   Resp 18   Wt 200 lb (90.7 kg)   BMI 26.39 kg/m   Filed Weights   08/01/18 1404  Weight: 200 lb (90.7 kg)    Physical Exam  Constitutional: He is oriented to person, place, and time and well-developed, well-nourished, and in no distress.  Accompanied by his wife daughter.  Walking himself.  HENT:  Head: Normocephalic and atraumatic.  Mouth/Throat: Oropharynx is clear and moist. No oropharyngeal exudate.  Eyes: Pupils are equal, round, and reactive to light.  Neck: Normal range of motion. Neck supple.  Cardiovascular: Normal rate and regular rhythm.  Pulmonary/Chest: No respiratory distress. He has no wheezes.  Abdominal: Soft. Bowel sounds are normal. He exhibits no distension and no mass. There is no guarding.  Musculoskeletal: Normal range of motion. He exhibits no edema or tenderness.  Neurological: He is  alert and oriented to person, place, and time.  Skin: Skin is warm.  Psychiatric: Affect normal.       LABORATORY DATA:  I have reviewed the data as listed    Component Value Date/Time   NA 136 07/25/2018 1110   NA 146 (H) 05/25/2016 1514   K 3.4 (L) 07/25/2018 1110   CL 101 07/25/2018 1110   CO2 27 07/25/2018 1110  GLUCOSE 181 (H) 07/25/2018 1110   BUN 20 07/25/2018 1110   BUN 19 05/25/2016 1514   CREATININE 1.14 07/25/2018 1110   CALCIUM 9.2 07/25/2018 1110   PROT 6.8 07/25/2018 1110   ALBUMIN 4.2 07/25/2018 1110   AST 28 07/25/2018 1110   ALT 26 07/25/2018 1110   ALKPHOS 81 07/25/2018 1110   BILITOT 0.9 07/25/2018 1110   GFRNONAA >60 07/25/2018 1110   GFRAA >60 07/25/2018 1110    No results found for: SPEP, UPEP  Lab Results  Component Value Date   WBC 5.3 07/28/2018   NEUTROABS 6.5 07/25/2018   HGB 14.0 07/28/2018   HCT 39.7 (L) 07/28/2018   MCV 97.4 07/28/2018   PLT 114 (L) 07/28/2018      Chemistry      Component Value Date/Time   NA 136 07/25/2018 1110   NA 146 (H) 05/25/2016 1514   K 3.4 (L) 07/25/2018 1110   CL 101 07/25/2018 1110   CO2 27 07/25/2018 1110   BUN 20 07/25/2018 1110   BUN 19 05/25/2016 1514   CREATININE 1.14 07/25/2018 1110      Component Value Date/Time   CALCIUM 9.2 07/25/2018 1110   ALKPHOS 81 07/25/2018 1110   AST 28 07/25/2018 1110   ALT 26 07/25/2018 1110   BILITOT 0.9 07/25/2018 1110       RADIOGRAPHIC STUDIES: I have personally reviewed the radiological images as listed and agreed with the findings in the report. No results found.   ASSESSMENT & PLAN:  Cancer of left kidney excluding renal pelvis [facility])  # Left kidney cancer metastatic to the lung- June 2019 CT scan shows stable to improved lung lesions; however multiple lytic lesions in the bones.- MIXED RESPONSE.  Cabo on [place] secondary acute issues [see discussion below].  Stable.  We will plan to start cabo if abdominal pain improved at next visit in 1  week  #Abdominal pain-not improved/worse; [recent CT positive for colitis status post Cipro Flagyl]-question other causes STOP mestinon.   # Colitis/diarrhea-noted on CT scan. Finish Cipro and Flagyl; improved.  # Multiple lytic lesions in the bones/left hip-on Xgeva s/p RT-pain improved.  #Orthostatic hypotension-stable. HOLD mestinon.  #Difficulty with urination-hold Zanaflex; Bentyl.  Continue Flomax.  #Follow-up in 1 week; if pain improved recommend starting cabo.    Orders Placed This Encounter  Procedures  . CBC with Differential    Standing Status:   Future    Standing Expiration Date:   08/02/2019  . Basic metabolic panel    Standing Status:   Future    Standing Expiration Date:   08/02/2019   All questions were answered. The patient knows to call the clinic with any problems, questions or concerns.      Cammie Sickle, MD 08/01/2018 4:43 PM

## 2018-08-01 NOTE — Patient Instructions (Addendum)
STOP MESTINON  STOP BENTYL  STOP ZANAFLEX  STOP LIPITOR

## 2018-08-01 NOTE — Progress Notes (Signed)
Per pt overall feeling " fine till my stomach starts hurting -today pt stated pain intermittent but now in office feeling moreso. He stated. Per pt diff initiating flow of urine/ weak stream he stated.

## 2018-08-01 NOTE — Assessment & Plan Note (Addendum)
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  Cabo on [place] secondary acute issues [see discussion below].  Stable.  We will plan to start cabo if abdominal pain improved at next visit in 1 week  #Abdominal pain-not improved/worse; [recent CT positive for colitis status post Cipro Flagyl]-question other causes STOP mestinon.   # Colitis/diarrhea-noted on CT scan. Finish Cipro and Flagyl; improved.  # Multiple lytic lesions in the bones/left hip-on Xgeva s/p RT-pain improved.  #Orthostatic hypotension-stable. HOLD mestinon.  #Difficulty with urination-hold Zanaflex; Bentyl.  Continue Flomax.  #Follow-up in 1 week; if pain improved recommend starting cabo.

## 2018-08-08 ENCOUNTER — Inpatient Hospital Stay (HOSPITAL_BASED_OUTPATIENT_CLINIC_OR_DEPARTMENT_OTHER): Payer: PPO | Admitting: Internal Medicine

## 2018-08-08 ENCOUNTER — Other Ambulatory Visit: Payer: Self-pay

## 2018-08-08 ENCOUNTER — Inpatient Hospital Stay: Payer: PPO

## 2018-08-08 VITALS — BP 156/83 | HR 76 | Temp 96.4°F | Resp 18 | Wt 202.0 lb

## 2018-08-08 DIAGNOSIS — Z9221 Personal history of antineoplastic chemotherapy: Secondary | ICD-10-CM

## 2018-08-08 DIAGNOSIS — Z87891 Personal history of nicotine dependence: Secondary | ICD-10-CM | POA: Diagnosis not present

## 2018-08-08 DIAGNOSIS — C7800 Secondary malignant neoplasm of unspecified lung: Secondary | ICD-10-CM

## 2018-08-08 DIAGNOSIS — C642 Malignant neoplasm of left kidney, except renal pelvis: Secondary | ICD-10-CM

## 2018-08-08 DIAGNOSIS — Z79899 Other long term (current) drug therapy: Secondary | ICD-10-CM | POA: Diagnosis not present

## 2018-08-08 DIAGNOSIS — M899 Disorder of bone, unspecified: Secondary | ICD-10-CM | POA: Diagnosis not present

## 2018-08-08 DIAGNOSIS — I951 Orthostatic hypotension: Secondary | ICD-10-CM

## 2018-08-08 DIAGNOSIS — R519 Headache, unspecified: Secondary | ICD-10-CM

## 2018-08-08 DIAGNOSIS — R3912 Poor urinary stream: Secondary | ICD-10-CM

## 2018-08-08 DIAGNOSIS — Z923 Personal history of irradiation: Secondary | ICD-10-CM | POA: Diagnosis not present

## 2018-08-08 DIAGNOSIS — C652 Malignant neoplasm of left renal pelvis: Secondary | ICD-10-CM | POA: Diagnosis not present

## 2018-08-08 DIAGNOSIS — R51 Headache: Secondary | ICD-10-CM | POA: Diagnosis not present

## 2018-08-08 LAB — BASIC METABOLIC PANEL
Anion gap: 8 (ref 5–15)
BUN: 14 mg/dL (ref 8–23)
CALCIUM: 8.9 mg/dL (ref 8.9–10.3)
CHLORIDE: 103 mmol/L (ref 98–111)
CO2: 25 mmol/L (ref 22–32)
CREATININE: 1.24 mg/dL (ref 0.61–1.24)
GFR calc Af Amer: >60 mL/min (ref >60)
GFR calc non Af Amer: 54 mL/min — ABNORMAL LOW (ref >60)
GLUCOSE: 105 mg/dL — AB (ref 70–99)
Potassium: 4.5 mmol/L (ref 3.5–5.1)
Sodium: 136 mmol/L (ref 135–145)

## 2018-08-08 LAB — CBC WITH DIFFERENTIAL/PLATELET
BASOS PCT: 1 %
Basophils Absolute: 0.1 10*3/uL (ref 0–0.1)
EOS ABS: 0.1 10*3/uL (ref 0–0.7)
EOS PCT: 1 %
HCT: 38.9 % — ABNORMAL LOW (ref 40.0–52.0)
HEMOGLOBIN: 13.9 g/dL (ref 13.0–18.0)
LYMPHS ABS: 0.8 10*3/uL — AB (ref 1.0–3.6)
Lymphocytes Relative: 18 %
MCH: 34.9 pg — AB (ref 26.0–34.0)
MCHC: 35.8 g/dL (ref 32.0–36.0)
MCV: 97.3 fL (ref 80.0–100.0)
MONO ABS: 0.5 10*3/uL (ref 0.2–1.0)
MONOS PCT: 11 %
NEUTROS PCT: 69 %
Neutro Abs: 3 10*3/uL (ref 1.4–6.5)
Platelets: 107 10*3/uL — ABNORMAL LOW (ref 150–440)
RBC: 3.99 MIL/uL — ABNORMAL LOW (ref 4.40–5.90)
RDW: 17.3 % — AB (ref 11.5–14.5)
WBC: 4.4 10*3/uL (ref 3.8–10.6)

## 2018-08-08 MED ORDER — OXYCODONE HCL 5 MG PO TABS: ORAL_TABLET | ORAL | 0 refills | Status: DC

## 2018-08-08 NOTE — Patient Instructions (Signed)
#   [facility] referral- Crisoforo Oxford, MD/ call for time of appt- [phone]

## 2018-08-08 NOTE — Assessment & Plan Note (Addendum)
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   # Re-START Cabo 40mg /day given improvement of abodminal pain [see below]; awaiting evaluation at [facility] for second opinion.  #Abdominal pain improved-question related to Mestinon.  Mestinon discontinued.  #Headaches-worsening; last MRI brain February 2019 normal.  However recommend repeating MRI of the brain.  # Multiple lytic lesions in the bones/left hip-on Xgeva s/p RT-pain improved.  #Orthostatic hypotension-stable. HOLD mestinon.  #Difficulty with urination-stable continue Flomax.  # follow up in 2 weeks/; no labs; MRI brain asap- Dr.B

## 2018-08-08 NOTE — Progress Notes (Signed)
Here for follow up. Per pt has been having intermittent headaches- frontal lobe region. No nausea , stated headaches no regularity- had one 3 days in a row. Last headache was yesterday- stated he took Oxycodone ( last dose ) and headache left. Pt stated he needs renewal of oxycodone.

## 2018-08-08 NOTE — Progress Notes (Signed)
[facility] OFFICE PROGRESS NOTE  Patient Care Team: Dion Body, MD as PCP - General (Family Medicine) Jannet Mantis, MD (Dermatology) Bary Castilla, Forest Gleason, MD (General Surgery) Hollice Espy, MD as Consulting Physician (Urology) Cammie Sickle, MD as Medical Oncologist (Medical Oncology) Isaias Cowman, MD as Consulting Physician (Cardiology) Harriette Bouillon, MD as Referring Physician (Cardiology)  Cancer Staging No matching staging information was found for the patient.   Oncology History   # MAY- June 2017- METASTATIC CLEAR CELL; LEFT RENAL CA/STAGE IV; [place]- G-3; INTERMEDIATE RISK;  [bil Pul lung nodules;incidental s/p Bx Dr.Byrnett/Dr.Oaks] s/p Cytoreductive nephrectomy; Dr.Brandon- [id number]; July 11th 2017 CT- Enlarging Lung nodules  # Aug 7th 2017- PAZOPANIB; STOP SEP 2017 [pancreatitis/poor tol]  # OCT 1 week- START NIVO q 2W x4; DEC 8th CT- "Progression"; Continue Opdivo;APRIL-MAY 2018- STABLE LUNG NODULES [stopped sec to severe cough; NO Pneumonitis]  # May 31st 2018- Cabo 22m/day; July 31st CT lung- improved lung nodules; Sep 1st week- cabo- 465mday [dose reduced sec to mult intol]; Oct 1st 2week-Cabo 2051may;   # NOV 15th 2018- CT chest- slight progression of lung nodules [poor tol to higer doses];   # Nov 28th 2018- SUTENT 50 mg 2w-on  &1 w-OFF;  # MARCH 2019- Progression; start Axitinib 5 mg BID; July 2019- MIXED response; worsening/new skeletal lesions/stable to improved lung lesions; STOP Axitinib;   # Left hip s/p RT [mid-AUG 2019]  # AUG 12th- RE-START CABO 40 mg/day;   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  male pleasant patient above history of metastatic renal cell cancer most recently on [place] here for follow-up. cabo is currently on hold for the last 2 weeks because of abdominal pain.  Abdominal pain is improved since stopping Mestinon.   However today complains of worsening headaches.  2-3 episodes a week.  Less than 30 minutes.  Generalized.  Wakes up in the morning.  Rates around 89 on a scale of 10 in the mornings.  He takes hydrocodone for improvement.  Diarrhea is improved.  No nausea vomiting.  Review of Systems  Constitutional: Positive for malaise/fatigue and weight loss. Negative for chills, diaphoresis and fever.  HENT: Negative for nosebleeds and sore throat.   Eyes: Negative for double vision.  Respiratory: Negative for cough, hemoptysis, sputum production, shortness of breath and wheezing.   Cardiovascular: Negative for chest pain, palpitations, orthopnea and leg swelling.  Gastrointestinal: Positive for abdominal pain. Negative for blood in stool, constipation, heartburn, melena and vomiting.  Genitourinary: Negative for dysuria, frequency and urgency.  Musculoskeletal: Negative for joint pain.  Skin: Negative.  Negative for itching and rash.  Neurological: Positive for dizziness. Negative for tingling, focal weakness, weakness and headaches.  Endo/Heme/Allergies: Does not bruise/bleed easily.  Psychiatric/Behavioral: Negative for depression. The patient is not nervous/anxious and does not have insomnia.       PAST MEDICAL HISTORY :  Past Medical History:  Diagnosis Date  . CAD (coronary artery disease)   . Cancer ([facility]  left arm  . COPD (chronic obstructive pulmonary disease) ([place])    . Hypertension   . Kidney stone   . Lung cancer ([place])   . Melanoma ([place]) 01/24/2016   right shoulder  . Thyroid nodule 02/02/2016   left BENIGN THYROID NODULE by FNA    PAST SURGICAL HISTORY :   Past Surgical History:  Procedure Laterality Date  . arm surgery Left    arm  . cardiac stents  2011   Angioplasty / Stenting Femoral-X2  . COLONOSCOPY  04/01/12  . CORONARY ANGIOPLASTY    . EXCISION MELANOMA WITH SENTINEL LYMPH NODE BIOPSY Right 01/24/2016   Procedure: EXCISION MELANOMA Right Shoulder;  Surgeon: Robert Bellow, MD;  Location: [facility] ORS;  Service: General;  Laterality: Right;  . LAPAROSCOPIC NEPHRECTOMY, HAND ASSISTED Left 04/24/2016   Procedure: HAND ASSISTED LAPAROSCOPIC NEPHRECTOMY;  Surgeon: Hollice Espy, MD;  Location: [facility] ORS;  Service: Urology;  Laterality: Left;  Marland Kitchen VIDEO ASSISTED THORACOSCOPY (VATS)/THOROCOTOMY Left 03/08/2016   Procedure: VIDEO ASSISTED THORACOSCOPY (VATS) with lung biopsy - Left ;  Surgeon: Robert Bellow, MD;  Location: [facility] ORS;  Service: General;  Laterality: Left;    FAMILY HISTORY :   Family History  Problem Relation Age of Onset  . Hodgkin's lymphoma Daughter 5  . Heart attack Father   . Kidney cancer Neg Hx   . Kidney disease Neg Hx   . Prostate cancer Neg Hx     SOCIAL HISTORY:   Social History   Tobacco Use  . Smoking status: Former Smoker    Packs/day: 2.00    Years: 30.00    Pack years: 60.00    Types: Cigarettes    Start date: 01/18/1956    Last attempt to quit: 01/17/1969    Years since quitting: 49.6  . Smokeless tobacco: Never Used  Substance Use Topics  . Alcohol use: No    Alcohol/week: 0.0 standard drinks    Comment: BEER OCC  . Drug use: No    ALLERGIES:  is allergic to lisinopril and other.  MEDICATIONS:  Current Outpatient Medications  Medication Sig Dispense Refill  . aspirin EC 81 MG tablet Take 81 mg by mouth daily.    Marland Kitchen atorvastatin (LIPITOR) 40 MG tablet Take 40 mg by mouth daily.    .  busPIRone (BUSPAR) 5 MG tablet Take 2 tablets by mouth daily.    . clopidogrel (PLAVIX) 75 MG tablet Take 75 mg by mouth daily.    Marland Kitchen docusate sodium (COLACE) 100 MG capsule Take 1 capsule (100 mg total) by mouth 2 (two) times daily. 60 capsule 3  . dronabinol (MARINOL) 2.5 MG capsule Take 1 capsule (2.5 mg total) by mouth 2 (two) times daily before a meal. 60 capsule 1  . fluticasone (FLONASE) 50 MCG/ACT nasal spray Place 2 sprays into both nostrils daily. 16 g 2  . Fluticasone-Salmeterol (ADVAIR DISKUS) 500-50 MCG/DOSE AEPB Inhale 1 puff into the lungs 2 (two) times daily. 60 each 11  . ipratropium-albuterol (DUONEB) 0.5-2.5 (3) MG/3ML SOLN Take 3 mLs by nebulization every 4 (four) hours as needed. 360 mL 3  . loperamide (IMODIUM) 1 MG/5ML solution Take 4 mg by mouth as needed for diarrhea or loose stools.    Marland Kitchen loratadine (CLARITIN) 10 MG tablet Take 10 mg by mouth daily.    Marland Kitchen MELATONIN PO Take 10 mg by mouth at bedtime.     . Misc Natural Products (GLUCOSAMINE CHOND COMPLEX/MSM) TABS Take 2 tablets by mouth daily.    Marland Kitchen  montelukast (SINGULAIR) 10 MG tablet TAKE 1 TABLET BY MOUTH EVERYDAY AT BEDTIME 30 tablet 3  . Omega-3 Fatty Acids (FISH OIL) 1000 MG CPDR Take 1 capsule by mouth daily.    . OXYGEN Inhale 2 L into the lungs at bedtime.    . pyridostigmine (MESTINON) 60 MG tablet Take 1 tablet by mouth 2 (two) times daily.    . tamsulosin (FLOMAX) 0.4 MG CAPS capsule TAKE 1 CAPSULE (0.4 MG TOTAL) BY MOUTH EVERY MORNING. 30 capsule 3  . Tiotropium Bromide Monohydrate (SPIRIVA RESPIMAT) 1.25 MCG/ACT AERS Inhale 1.25 Act into the lungs 2 (two) times daily. 1 Inhaler 11  . tiZANidine (ZANAFLEX) 4 MG tablet Take 4 mg by mouth daily.     Marland Kitchen acetaminophen (TYLENOL) 500 MG tablet Take 500 mg by mouth every 6 (six) hours as needed for moderate pain.    Marland Kitchen albuterol (PROVENTIL HFA;VENTOLIN HFA) 108 (90 Base) MCG/ACT inhaler Inhale 2 puffs into the lungs every 6 (six) hours as needed for wheezing or shortness  of breath. (Patient not taking: Reported on 08/08/2018) 1 Inhaler 2  . dicyclomine (BENTYL) 20 MG tablet Take 1 tablet (20 mg total) by mouth every 6 (six) hours. (Patient not taking: Reported on 08/08/2018) 30 tablet 0  . diphenoxylate-atropine (LOMOTIL) 2.5-0.025 MG tablet Take 2 tablets by mouth 4 (four) times daily as needed for diarrhea or loose stools. (Patient not taking: Reported on 08/08/2018) 30 tablet 0  . DULoxetine (CYMBALTA) 30 MG capsule Take 2 capsules by mouth daily.    . ferrous sulfate 325 (65 FE) MG EC tablet Take 325 mg by mouth 3 (three) times daily with meals.    . nitroGLYCERIN (NITROSTAT) 0.4 MG SL tablet Place 0.4 mg under the tongue every 5 (five) minutes as needed.     Marland Kitchen oxyCODONE (OXY IR/ROXICODONE) 5 MG immediate release tablet 1 pill every 12 hours as needed for pain 40 tablet 0   No current facility-administered medications for this visit.     PHYSICAL EXAMINATION: ECOG PERFORMANCE STATUS: 1 - Symptomatic but completely ambulatory  BP (!) 156/83 (BP Location: Left Arm, Patient Position: Sitting)   Pulse 76   Temp (!) 96.4 F (35.8 C) (Tympanic)   Resp 18   Wt 202 lb (91.6 kg)   BMI 26.65 kg/m   Filed Weights   08/08/18 1316  Weight: 202 lb (91.6 kg)    Physical Exam  Constitutional: He is oriented to person, place, and time and well-developed, well-nourished, and in no distress.  Accompanied by his wife. He is walking by himself.  HENT:  Head: Normocephalic and atraumatic.  Mouth/Throat: Oropharynx is clear and moist. No oropharyngeal exudate.  Eyes: Pupils are equal, round, and reactive to light.  Neck: Normal range of motion. Neck supple.  Cardiovascular: Normal rate and regular rhythm.  Pulmonary/Chest: No respiratory distress. He has no wheezes.  Abdominal: Soft. Bowel sounds are normal. He exhibits no distension and no mass. There is no guarding.  Musculoskeletal: Normal range of motion. He exhibits no edema or tenderness.  Neurological: He is  alert and oriented to person, place, and time.  Skin: Skin is warm.  Psychiatric: Affect normal.       LABORATORY DATA:  I have reviewed the data as listed    Component Value Date/Time   NA 136 08/08/2018 1252   NA 146 (H) 05/25/2016 1514   K 4.5 08/08/2018 1252   CL 103 08/08/2018 1252   CO2 25 08/08/2018 1252   GLUCOSE 105 (  H) 08/08/2018 1252   BUN 14 08/08/2018 1252   BUN 19 05/25/2016 1514   CREATININE 1.24 08/08/2018 1252   CALCIUM 8.9 08/08/2018 1252   PROT 6.8 07/25/2018 1110   ALBUMIN 4.2 07/25/2018 1110   AST 28 07/25/2018 1110   ALT 26 07/25/2018 1110   ALKPHOS 81 07/25/2018 1110   BILITOT 0.9 07/25/2018 1110   GFRNONAA 54 (L) 08/08/2018 1252   GFRAA >60 08/08/2018 1252    No results found for: SPEP, UPEP  Lab Results  Component Value Date   WBC 4.4 08/08/2018   NEUTROABS 3.0 08/08/2018   HGB 13.9 08/08/2018   HCT 38.9 (L) 08/08/2018   MCV 97.3 08/08/2018   PLT 107 (L) 08/08/2018      Chemistry      Component Value Date/Time   NA 136 08/08/2018 1252   NA 146 (H) 05/25/2016 1514   K 4.5 08/08/2018 1252   CL 103 08/08/2018 1252   CO2 25 08/08/2018 1252   BUN 14 08/08/2018 1252   BUN 19 05/25/2016 1514   CREATININE 1.24 08/08/2018 1252      Component Value Date/Time   CALCIUM 8.9 08/08/2018 1252   ALKPHOS 81 07/25/2018 1110   AST 28 07/25/2018 1110   ALT 26 07/25/2018 1110   BILITOT 0.9 07/25/2018 1110       RADIOGRAPHIC STUDIES: I have personally reviewed the radiological images as listed and agreed with the findings in the report. No results found.   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   # Re-START Cabo 81m/day given improvement of abodminal pain [see below]; [facility].  #Abdominal pain  improved-question related to Mestinon.  Mestinon discontinued.  #Headaches-worsening; last MRI brain February 2019 normal.  However recommend repeating MRI of the brain.  # Multiple lytic lesions in the bones/left hip-on Xgeva s/p RT-pain improved.  #Orthostatic hypotension-stable. HOLD mestinon.  #Difficulty with urination-stable continue Flomax.  # follow up in 2 weeks/; no labs; MRI brain asap- Dr.B    Orders Placed This Encounter  Procedures  . MR Brain W Wo Contrast    Standing Status:   Future    Standing Expiration Date:   08/08/2019    Order Specific Question:   ** REASON FOR EXAM (FREE TEXT)    Answer:   worsening headaches; metatstatic kidney cancer    Order Specific Question:   If indicated for the ordered procedure, I authorize the administration of contrast media per Radiology protocol    Answer:   Yes    Order Specific Question:   What is the patient's sedation requirement?    Answer:   No Sedation    Order Specific Question:   Does the patient have a pacemaker or implanted devices?    Answer:   No    Order Specific Question:   Use SRS Protocol?    Answer:   No    Order Specific Question:   Radiology Contrast Protocol - do NOT remove file path    Answer:   [email or web address]    Order Specific Question:   [facility]   All questions were answered. The patient knows to call the clinic with any problems, questions or concerns.  Cammie Sickle, MD 08/12/2018 12:13 AM

## 2018-08-12 ENCOUNTER — Ambulatory Visit
Admission: RE | Admit: 2018-08-12 | Discharge: 2018-08-12 | Disposition: A | Discharge status: Home / Self Care | Payer: PPO | Source: Ambulatory Visit | Attending: Internal Medicine | Admitting: Internal Medicine

## 2018-08-12 DIAGNOSIS — R519 Headache, unspecified: Secondary | ICD-10-CM

## 2018-08-12 DIAGNOSIS — R51 Headache: Secondary | ICD-10-CM | POA: Diagnosis not present

## 2018-08-12 DIAGNOSIS — C642 Malignant neoplasm of left kidney, except renal pelvis: Secondary | ICD-10-CM | POA: Diagnosis not present

## 2018-08-12 MED ORDER — GADOBENATE DIMEGLUMINE 529 MG/ML IV SOLN
20.0000 mL | Freq: Once | INTRAVENOUS | Status: AC | PRN
  Administered 2018-08-12: 19 mL via INTRAVENOUS

## 2018-08-13 ENCOUNTER — Encounter: Payer: Self-pay | Admitting: Radiation Oncology

## 2018-08-13 ENCOUNTER — Ambulatory Visit
Admission: RE | Admit: 2018-08-13 | Discharge: 2018-08-13 | Disposition: A | Discharge status: Home / Self Care | Payer: PPO | Source: Ambulatory Visit | Attending: Radiation Oncology | Admitting: Radiation Oncology

## 2018-08-13 ENCOUNTER — Telehealth: Payer: Self-pay | Admitting: Internal Medicine

## 2018-08-13 ENCOUNTER — Other Ambulatory Visit: Payer: Self-pay

## 2018-08-13 VITALS — BP 147/95 | HR 74 | Temp 94.9°F | Resp 18 | Wt 198.5 lb

## 2018-08-13 DIAGNOSIS — C649 Malignant neoplasm of unspecified kidney, except renal pelvis: Secondary | ICD-10-CM | POA: Diagnosis not present

## 2018-08-13 DIAGNOSIS — Z923 Personal history of irradiation: Secondary | ICD-10-CM | POA: Insufficient documentation

## 2018-08-13 DIAGNOSIS — C7951 Secondary malignant neoplasm of bone: Secondary | ICD-10-CM

## 2018-08-13 NOTE — Progress Notes (Signed)
Radiation Oncology Follow up Note  Name: [place]   Date:   08/13/2018 MRN:  [id number] DOB: 04-Dec-1940    This 77 y.o. male presents to the clinic today for one-month follow-up status post palliative radiation therapy to L4 as well as left iliac wing.  REFERRING PROVIDER: Dion Body, MD  HPI: patient is a 77 year old malewith stage IV renal cell carcinoma now out 1 month having completed palliative radiation therapy to L4 as well as left iliac wing he is seen today in routine follow-up and is doing well. He specifically denies diarrhea dysuria or any other GI/GU complaints. He is completely pain-free..he recently was having some headaches brain MRI showed no evidence of intracranial disease.he is currently receiving carboplatin by medical oncology's direction and is tolerating that well.  COMPLICATIONS OF TREATMENT: none  FOLLOW UP COMPLIANCE: keeps appointments   PHYSICAL EXAM:  BP (!) 147/95 (BP Location: Left Arm, Patient Position: Sitting)   Pulse 74   Temp (!) 94.9 F (34.9 C) (Tympanic)   Resp 18   Wt 198 lb 7.5 oz (90 kg)   BMI 26.18 kg/m  Range of motion of his lower extremities does not elicit pain. Deep palpation of the spine does not elicit pain motor sensory and DTR levels are equal and symmetric in the lower extremities.Well-developed well-nourished patient in NAD. HEENT reveals PERLA, EOMI, discs not visualized.  Oral cavity is clear. No oral mucosal lesions are identified. Neck is clear without evidence of cervical or supraclavicular adenopathy. Lungs are clear to A&P. Cardiac examination is essentially unremarkable with regular rate and rhythm without murmur rub or thrill. Abdomen is benign with no organomegaly or masses noted. Motor sensory and DTR levels are equal and symmetric in the upper and lower extremities. Cranial nerves II through XII are grossly intact. Proprioception is intact. No peripheral adenopathy or edema is identified. No motor or sensory  levels are noted. Crude visual fields are within normal range.  RADIOLOGY RESULTS: no current films for review  PLAN: present time patient is doing well pain free from palliative radiation. I am please was overall progress. I've asked medical oncology to continue close follow-up care. I am going to discontinue follow-up care be happy to reevaluate the patient at any time should further treatment be indicated.  I would like to take this opportunity to thank you for allowing me to participate in the care of your patient.Noreene Filbert, MD

## 2018-08-13 NOTE — Telephone Encounter (Signed)
Contacted patient. Pt informed of results and to continue pain medications as needed for headaches. Teach back process performed with patient.

## 2018-08-13 NOTE — Telephone Encounter (Signed)
Heather/Brooke please inform patient that MRI brain does not show any evidence of cancer.  Continue pain medication as needed for headaches.  Follow-up as planned

## 2018-08-15 DIAGNOSIS — C7951 Secondary malignant neoplasm of bone: Secondary | ICD-10-CM | POA: Diagnosis not present

## 2018-08-15 DIAGNOSIS — C649 Malignant neoplasm of unspecified kidney, except renal pelvis: Secondary | ICD-10-CM | POA: Diagnosis not present

## 2018-08-15 DIAGNOSIS — C7801 Secondary malignant neoplasm of right lung: Secondary | ICD-10-CM | POA: Diagnosis not present

## 2018-08-15 DIAGNOSIS — C642 Malignant neoplasm of left kidney, except renal pelvis: Secondary | ICD-10-CM | POA: Diagnosis not present

## 2018-08-18 DIAGNOSIS — R918 Other nonspecific abnormal finding of lung field: Secondary | ICD-10-CM | POA: Diagnosis not present

## 2018-08-18 DIAGNOSIS — C7951 Secondary malignant neoplasm of bone: Secondary | ICD-10-CM | POA: Diagnosis not present

## 2018-08-18 DIAGNOSIS — C642 Malignant neoplasm of left kidney, except renal pelvis: Secondary | ICD-10-CM | POA: Diagnosis not present

## 2018-08-19 DIAGNOSIS — C642 Malignant neoplasm of left kidney, except renal pelvis: Secondary | ICD-10-CM | POA: Diagnosis not present

## 2018-08-19 DIAGNOSIS — R0602 Shortness of breath: Secondary | ICD-10-CM | POA: Diagnosis not present

## 2018-08-19 DIAGNOSIS — R05 Cough: Secondary | ICD-10-CM | POA: Diagnosis not present

## 2018-08-19 DIAGNOSIS — J449 Chronic obstructive pulmonary disease, unspecified: Secondary | ICD-10-CM | POA: Diagnosis not present

## 2018-08-20 ENCOUNTER — Other Ambulatory Visit: Payer: Self-pay

## 2018-08-20 ENCOUNTER — Inpatient Hospital Stay: Payer: PPO | Attending: Internal Medicine | Admitting: Internal Medicine

## 2018-08-20 VITALS — BP 164/106 | HR 68 | Temp 97.1°F | Resp 20 | Ht 73.0 in | Wt 197.0 lb

## 2018-08-20 DIAGNOSIS — I10 Essential (primary) hypertension: Secondary | ICD-10-CM | POA: Diagnosis not present

## 2018-08-20 DIAGNOSIS — Z23 Encounter for immunization: Secondary | ICD-10-CM | POA: Diagnosis not present

## 2018-08-20 DIAGNOSIS — I2510 Atherosclerotic heart disease of native coronary artery without angina pectoris: Secondary | ICD-10-CM | POA: Insufficient documentation

## 2018-08-20 DIAGNOSIS — R634 Abnormal weight loss: Secondary | ICD-10-CM | POA: Diagnosis not present

## 2018-08-20 DIAGNOSIS — Z85820 Personal history of malignant melanoma of skin: Secondary | ICD-10-CM | POA: Insufficient documentation

## 2018-08-20 DIAGNOSIS — Z5111 Encounter for antineoplastic chemotherapy: Secondary | ICD-10-CM | POA: Diagnosis not present

## 2018-08-20 DIAGNOSIS — C7800 Secondary malignant neoplasm of unspecified lung: Secondary | ICD-10-CM | POA: Insufficient documentation

## 2018-08-20 DIAGNOSIS — E041 Nontoxic single thyroid nodule: Secondary | ICD-10-CM | POA: Insufficient documentation

## 2018-08-20 DIAGNOSIS — Z87891 Personal history of nicotine dependence: Secondary | ICD-10-CM | POA: Insufficient documentation

## 2018-08-20 DIAGNOSIS — R531 Weakness: Secondary | ICD-10-CM | POA: Diagnosis not present

## 2018-08-20 DIAGNOSIS — J449 Chronic obstructive pulmonary disease, unspecified: Secondary | ICD-10-CM | POA: Diagnosis not present

## 2018-08-20 DIAGNOSIS — I951 Orthostatic hypotension: Secondary | ICD-10-CM

## 2018-08-20 DIAGNOSIS — Z7982 Long term (current) use of aspirin: Secondary | ICD-10-CM | POA: Diagnosis not present

## 2018-08-20 DIAGNOSIS — C642 Malignant neoplasm of left kidney, except renal pelvis: Secondary | ICD-10-CM | POA: Insufficient documentation

## 2018-08-20 DIAGNOSIS — R109 Unspecified abdominal pain: Secondary | ICD-10-CM | POA: Insufficient documentation

## 2018-08-20 DIAGNOSIS — Z87442 Personal history of urinary calculi: Secondary | ICD-10-CM | POA: Insufficient documentation

## 2018-08-20 DIAGNOSIS — R5383 Other fatigue: Secondary | ICD-10-CM | POA: Diagnosis not present

## 2018-08-20 DIAGNOSIS — Z79899 Other long term (current) drug therapy: Secondary | ICD-10-CM | POA: Insufficient documentation

## 2018-08-20 DIAGNOSIS — M25512 Pain in left shoulder: Secondary | ICD-10-CM | POA: Insufficient documentation

## 2018-08-20 MED ORDER — DICYCLOMINE HCL 20 MG PO TABS: 20.0000 mg | ORAL_TABLET | Freq: Three times a day (TID) | ORAL | 0 refills | Status: DC

## 2018-08-20 NOTE — Progress Notes (Signed)
[place] OFFICE PROGRESS NOTE  Patient Care Team: Dion Body, MD as PCP - General (Family Medicine) Jannet Mantis, MD (Dermatology) Bary Castilla, Forest Gleason, MD (General Surgery) Hollice Espy, MD as Consulting Physician (Urology) Cammie Sickle, MD as Medical Oncologist (Medical Oncology) Isaias Cowman, MD as Consulting Physician (Cardiology) Harriette Bouillon, MD as Referring Physician (Cardiology)  Cancer Staging No matching staging information was found for the patient.   Oncology History   # MAY- June 2017- METASTATIC CLEAR CELL; LEFT RENAL CA/STAGE IV; [place]- G-3; INTERMEDIATE RISK;  [bil Pul lung nodules;incidental s/p Bx Dr.Byrnett/Dr.Oaks] s/p Cytoreductive nephrectomy; Dr.Brandon- [id number]; July 11th 2017 CT- Enlarging Lung nodules  # Aug 7th 2017- PAZOPANIB; STOP SEP 2017 [pancreatitis/poor tol]  # OCT 1 week- START NIVO q 2W x4; DEC 8th CT- "Progression"; Continue Opdivo;APRIL-MAY 2018- STABLE LUNG NODULES [stopped sec to severe cough; NO Pneumonitis]  # May 31st 2018- Cabo 70m/day; July 31st CT lung- improved lung nodules; Sep 1st week- cabo- 457mday [dose reduced sec to mult intol]; Oct 1st 2week-Cabo 2057may;   # NOV 15th 2018- CT chest- slight progression of lung nodules [poor tol to higer doses];   # Nov 28th 2018- SUTENT 50 mg 2w-on  &1 w-OFF;  # MARCH 2019- Progression; start Axitinib 5 mg BID; July 2019- MIXED response; worsening/new skeletal lesions/stable to improved lung lesions; STOP Axitinib;   # Left hip s/p RT [mid-AUG 2019]  # AUG 12th- RE-START CABO 40 mg/day;   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  male pleasant patient above history of metastatic renal cell cancer most recently on [place] here for follow-up.  Patient was recently Re-Started on cabo approximately 2 weeks ago.  [facility]  The recommendation was to continue cabo at this time.  [facility] of [facility] since he started taking the cabo couple weeks ago.  Comes and goes.  No aggravating or relieving factors.  No nausea vomiting.  Review of Systems  Constitutional: Positive for malaise/fatigue and weight loss. Negative for chills, diaphoresis and fever.  HENT: Negative for nosebleeds and sore throat.   Eyes: Negative for double vision.  Respiratory: Negative for cough, hemoptysis, sputum production, shortness of breath and wheezing.   Cardiovascular: Negative for chest pain, palpitations, orthopnea and leg swelling.  Gastrointestinal: Positive for abdominal pain. Negative for blood in stool, constipation, heartburn, melena and vomiting.  Genitourinary: Negative for dysuria, frequency and urgency.  Musculoskeletal: Negative for joint pain.  Skin: Negative.  Negative for itching and rash.  Neurological: Positive for dizziness. Negative for tingling, focal weakness, weakness and headaches.  Endo/Heme/Allergies: Does not bruise/bleed easily.  Psychiatric/Behavioral: Negative for depression. The patient is not nervous/anxious and does not have insomnia.       PAST MEDICAL HISTORY :  Past Medical History:  Diagnosis Date  . CAD (coronary artery disease)   . [place]  [place]  [place]  . COPD (chronic obstructive pulmonary disease)  ([place])   . Hypertension   . Kidney stone   . Lung cancer ([place])   . Melanoma ([place]) 01/24/2016   right shoulder  . Thyroid nodule 02/02/2016   left BENIGN THYROID NODULE by FNA    PAST SURGICAL HISTORY :   Past Surgical History:  Procedure Laterality Date  . arm surgery Left    arm  . cardiac stents  2011   Angioplasty / Stenting Femoral-X2  . COLONOSCOPY  04/01/12  . CORONARY ANGIOPLASTY    . EXCISION MELANOMA WITH SENTINEL LYMPH NODE BIOPSY Right 01/24/2016   Procedure: EXCISION MELANOMA Right Shoulder;  Surgeon: Robert Bellow, MD;  Location: [facility] ORS;  Service: General;  Laterality: Right;  . LAPAROSCOPIC NEPHRECTOMY, HAND ASSISTED Left 04/24/2016   Procedure: HAND ASSISTED LAPAROSCOPIC NEPHRECTOMY;  Surgeon: Hollice Espy, MD;  Location: [facility] ORS;  Service: Urology;  Laterality: Left;  Marland Kitchen VIDEO ASSISTED THORACOSCOPY (VATS)/THOROCOTOMY Left 03/08/2016   Procedure: VIDEO ASSISTED THORACOSCOPY (VATS) with lung biopsy - Left ;  Surgeon: Robert Bellow, MD;  Location: [facility] ORS;  Service: General;  Laterality: Left;    FAMILY HISTORY :   Family History  Problem Relation Age of Onset  . Hodgkin's lymphoma Daughter 5  . Heart attack Father   . Kidney cancer Neg Hx   . Kidney disease Neg Hx   . Prostate cancer Neg Hx     SOCIAL HISTORY:   Social History   Tobacco Use  . Smoking status: Former Smoker    Packs/day: 2.00    Years: 30.00    Pack years: 60.00    Types: Cigarettes    Start date: 01/18/1956    Last attempt to quit: 01/17/1969    Years since quitting: 49.6  . Smokeless tobacco: Never Used  Substance Use Topics  . Alcohol use: No    Alcohol/week: 0.0 standard drinks    Comment: BEER OCC  . Drug use: No    ALLERGIES:  is allergic to lisinopril and other.  MEDICATIONS:  Current Outpatient Medications  Medication Sig Dispense Refill  . acetaminophen (TYLENOL) 500 MG tablet Take 500 mg by mouth every 6 (six) hours as needed for moderate pain.    Marland Kitchen aspirin EC 81  MG tablet Take 81 mg by mouth daily.    Marland Kitchen atorvastatin (LIPITOR) 40 MG tablet Take 40 mg by mouth daily.    . busPIRone (BUSPAR) 5 MG tablet Take 2 tablets by mouth daily.    . clopidogrel (PLAVIX) 75 MG tablet Take 75 mg by mouth daily.    Marland Kitchen docusate sodium (COLACE) 100 MG capsule Take 1 capsule (100 mg total) by mouth 2 (two) times daily. 60 capsule 3  . dronabinol (MARINOL) 2.5 MG capsule Take 1 capsule (2.5 mg total) by mouth 2 (two) times daily before a meal. 60 capsule 1  . fluticasone (FLONASE) 50 MCG/ACT nasal spray Place 2 sprays into both nostrils daily. 16 g 2  . Fluticasone-Salmeterol (ADVAIR DISKUS) 500-50 MCG/DOSE AEPB Inhale 1 puff into the lungs 2 (two) times daily. 60 each 11  . ipratropium-albuterol (DUONEB) 0.5-2.5 (3) MG/3ML SOLN Take 3 mLs by nebulization every 4 (four) hours as needed. 360 mL 3  . loratadine (CLARITIN) 10 MG tablet Take 10 mg by mouth daily.    Marland Kitchen MELATONIN PO Take 10 mg by mouth at bedtime.     . Misc Natural Products (GLUCOSAMINE CHOND COMPLEX/MSM) TABS Take 2 tablets by mouth daily.    Marland Kitchen  montelukast (SINGULAIR) 10 MG tablet TAKE 1 TABLET BY MOUTH EVERYDAY AT BEDTIME 30 tablet 3  . Omega-3 Fatty Acids (FISH OIL) 1000 MG CPDR Take 1 capsule by mouth daily.    Marland Kitchen oxyCODONE (OXY IR/ROXICODONE) 5 MG immediate release tablet 1 pill every 12 hours as needed for pain 40 tablet 0  . OXYGEN Inhale 2 L into the lungs at bedtime.    . tamsulosin (FLOMAX) 0.4 MG CAPS capsule TAKE 1 CAPSULE (0.4 MG TOTAL) BY MOUTH EVERY MORNING. 30 capsule 3  . Tiotropium Bromide Monohydrate (SPIRIVA RESPIMAT) 1.25 MCG/ACT AERS Inhale 1.25 Act into the lungs 2 (two) times daily. 1 Inhaler 11  . tiZANidine (ZANAFLEX) 4 MG tablet Take 4 mg by mouth daily.     Marland Kitchen albuterol (PROVENTIL HFA;VENTOLIN HFA) 108 (90 Base) MCG/ACT inhaler Inhale 2 puffs into the lungs every 6 (six) hours as needed for wheezing or shortness of breath. (Patient not taking: Reported on 08/08/2018) 1 Inhaler 2  .  dicyclomine (BENTYL) 20 MG tablet Take 1 tablet (20 mg total) by mouth 3 (three) times daily before meals. 60 tablet 0  . diphenoxylate-atropine (LOMOTIL) 2.5-0.025 MG tablet Take 2 tablets by mouth 4 (four) times daily as needed for diarrhea or loose stools. (Patient not taking: Reported on 08/08/2018) 30 tablet 0  . DULoxetine (CYMBALTA) 30 MG capsule Take 2 capsules by mouth daily.    . ferrous sulfate 325 (65 FE) MG EC tablet Take 325 mg by mouth 3 (three) times daily with meals.    Marland Kitchen loperamide (IMODIUM) 1 MG/5ML solution Take 4 mg by mouth as needed for diarrhea or loose stools.    . nitroGLYCERIN (NITROSTAT) 0.4 MG SL tablet Place 0.4 mg under the tongue every 5 (five) minutes as needed.     . pyridostigmine (MESTINON) 60 MG tablet Take 1 tablet by mouth 2 (two) times daily.     No current facility-administered medications for this visit.     PHYSICAL EXAMINATION: ECOG PERFORMANCE STATUS: 1 - Symptomatic but completely ambulatory  BP (!) 164/106 (BP Location: Right Arm, Patient Position: Sitting)   Pulse 68   Temp (!) 97.1 F (36.2 C) (Tympanic)   Resp 20   Ht '6\' 1"'  (1.854 m)   Wt 197 lb (89.4 kg)   BMI 25.99 kg/m   Filed Weights   08/20/18 1458  Weight: 197 lb (89.4 kg)    Physical Exam  Constitutional: He is oriented to person, place, and time and well-developed, well-nourished, and in no distress.  Accompanied by his wife. He is walking by himself.  HENT:  Head: Normocephalic and atraumatic.  Mouth/Throat: Oropharynx is clear and moist. No oropharyngeal exudate.  Eyes: Pupils are equal, round, and reactive to light.  Neck: Normal range of motion. Neck supple.  Cardiovascular: Normal rate and regular rhythm.  Pulmonary/Chest: No respiratory distress. He has no wheezes.  Abdominal: Soft. Bowel sounds are normal. He exhibits no distension and no mass. There is no guarding.  Musculoskeletal: Normal range of motion. He exhibits no edema or tenderness.  Neurological: He is  alert and oriented to person, place, and time.  Skin: Skin is warm.  Psychiatric: Affect normal.       LABORATORY DATA:  I have reviewed the data as listed    Component Value Date/Time   NA 136 08/08/2018 1252   NA 146 (H) 05/25/2016 1514   K 4.5 08/08/2018 1252   CL 103 08/08/2018 1252   CO2 25 08/08/2018 1252   GLUCOSE  105 (H) 08/08/2018 1252   BUN 14 08/08/2018 1252   BUN 19 05/25/2016 1514   CREATININE 1.24 08/08/2018 1252   CALCIUM 8.9 08/08/2018 1252   PROT 6.8 07/25/2018 1110   ALBUMIN 4.2 07/25/2018 1110   AST 28 07/25/2018 1110   ALT 26 07/25/2018 1110   ALKPHOS 81 07/25/2018 1110   BILITOT 0.9 07/25/2018 1110   GFRNONAA 54 (L) 08/08/2018 1252   GFRAA >60 08/08/2018 1252    No results found for: SPEP, UPEP  Lab Results  Component Value Date   WBC 4.4 08/08/2018   NEUTROABS 3.0 08/08/2018   HGB 13.9 08/08/2018   HCT 38.9 (L) 08/08/2018   MCV 97.3 08/08/2018   PLT 107 (L) 08/08/2018      Chemistry      Component Value Date/Time   NA 136 08/08/2018 1252   NA 146 (H) 05/25/2016 1514   K 4.5 08/08/2018 1252   CL 103 08/08/2018 1252   CO2 25 08/08/2018 1252   BUN 14 08/08/2018 1252   BUN 19 05/25/2016 1514   CREATININE 1.24 08/08/2018 1252      Component Value Date/Time   CALCIUM 8.9 08/08/2018 1252   ALKPHOS 81 07/25/2018 1110   AST 28 07/25/2018 1110   ALT 26 07/25/2018 1110   BILITOT 0.9 07/25/2018 1110       RADIOGRAPHIC STUDIES: I have personally reviewed the radiological images as listed and agreed with the findings in the report. Ct Outside Films Body  Result Date: 08/21/2018 This examination belongs to an outside facility and is stored here for comparison purposes only.  Contact the originating outside institution for any associated report or interpretation.    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   #Currently on cabo on 40 mg a day; however hold cabo given worsening abdominal pain.  If improved recommend starting cabo at 20 mg a day; and then up to 30 mg a day as per recommendations at [facility].  #Abdominal pain improved-question related to cabozantanib. STOP cabo.  Reevaluate at next visit.   # Multiple lytic lesions in the bones/left hip-on Xgeva s/p RT-pain improved.STABLE.   #Orthostatic hypotension- STABLE HOLD mestinon.  # follow up in 2 weeks/; no labs; ? Re-start cabo.  Will review the imaging with radiology.   No orders of the defined types were placed in this encounter.  All questions were answered. The patient knows to call the clinic with any problems, questions or concerns.      Cammie Sickle, MD 08/22/2018 8:00 AM

## 2018-08-20 NOTE — Assessment & Plan Note (Addendum)
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   #Currently on cabo on 40 mg a day; however hold cabo given worsening abdominal pain.  If improved recommend starting cabo at 20 mg a day; and then up to 30 mg a day as per recommendations at [facility].  #Abdominal pain improved-question related to cabozantanib. STOP cabo.  Reevaluate at next visit.   # Multiple lytic lesions in the bones/left hip-on Xgeva s/p RT-pain improved.STABLE.   #Orthostatic hypotension- STABLE HOLD mestinon.  # follow up in 2 weeks/; no labs; ? Re-start cabo.  Will review the imaging with radiology.

## 2018-08-21 ENCOUNTER — Ambulatory Visit
Admission: RE | Admit: 2018-08-21 | Discharge: 2018-08-21 | Disposition: A | Discharge status: Home / Self Care | Payer: Self-pay | Source: Ambulatory Visit | Attending: Internal Medicine | Admitting: Internal Medicine

## 2018-08-21 ENCOUNTER — Other Ambulatory Visit: Payer: Self-pay | Admitting: Internal Medicine

## 2018-08-21 DIAGNOSIS — C799 Secondary malignant neoplasm of unspecified site: Secondary | ICD-10-CM

## 2018-08-21 DIAGNOSIS — C439 Malignant melanoma of skin, unspecified: Secondary | ICD-10-CM

## 2018-09-03 ENCOUNTER — Inpatient Hospital Stay (HOSPITAL_BASED_OUTPATIENT_CLINIC_OR_DEPARTMENT_OTHER): Payer: PPO | Admitting: Internal Medicine

## 2018-09-03 ENCOUNTER — Other Ambulatory Visit: Payer: Self-pay

## 2018-09-03 ENCOUNTER — Inpatient Hospital Stay: Payer: PPO

## 2018-09-03 ENCOUNTER — Encounter: Payer: Self-pay | Admitting: Internal Medicine

## 2018-09-03 VITALS — BP 163/88 | HR 80 | Temp 97.6°F | Resp 20

## 2018-09-03 DIAGNOSIS — R531 Weakness: Secondary | ICD-10-CM | POA: Diagnosis not present

## 2018-09-03 DIAGNOSIS — R109 Unspecified abdominal pain: Secondary | ICD-10-CM

## 2018-09-03 DIAGNOSIS — J449 Chronic obstructive pulmonary disease, unspecified: Secondary | ICD-10-CM | POA: Diagnosis not present

## 2018-09-03 DIAGNOSIS — Z87891 Personal history of nicotine dependence: Secondary | ICD-10-CM

## 2018-09-03 DIAGNOSIS — I10 Essential (primary) hypertension: Secondary | ICD-10-CM

## 2018-09-03 DIAGNOSIS — C642 Malignant neoplasm of left kidney, except renal pelvis: Secondary | ICD-10-CM

## 2018-09-03 DIAGNOSIS — Z23 Encounter for immunization: Secondary | ICD-10-CM | POA: Diagnosis not present

## 2018-09-03 DIAGNOSIS — Z7982 Long term (current) use of aspirin: Secondary | ICD-10-CM

## 2018-09-03 DIAGNOSIS — R634 Abnormal weight loss: Secondary | ICD-10-CM

## 2018-09-03 DIAGNOSIS — E041 Nontoxic single thyroid nodule: Secondary | ICD-10-CM

## 2018-09-03 DIAGNOSIS — Z79899 Other long term (current) drug therapy: Secondary | ICD-10-CM

## 2018-09-03 DIAGNOSIS — Z5111 Encounter for antineoplastic chemotherapy: Secondary | ICD-10-CM

## 2018-09-03 DIAGNOSIS — C7800 Secondary malignant neoplasm of unspecified lung: Secondary | ICD-10-CM | POA: Diagnosis not present

## 2018-09-03 DIAGNOSIS — Z85820 Personal history of malignant melanoma of skin: Secondary | ICD-10-CM

## 2018-09-03 DIAGNOSIS — I2510 Atherosclerotic heart disease of native coronary artery without angina pectoris: Secondary | ICD-10-CM

## 2018-09-03 DIAGNOSIS — R5383 Other fatigue: Secondary | ICD-10-CM | POA: Diagnosis not present

## 2018-09-03 DIAGNOSIS — Z87442 Personal history of urinary calculi: Secondary | ICD-10-CM

## 2018-09-03 DIAGNOSIS — I951 Orthostatic hypotension: Secondary | ICD-10-CM | POA: Diagnosis not present

## 2018-09-03 LAB — COMPREHENSIVE METABOLIC PANEL
ALT: 22 U/L (ref 0–44)
AST: 25 U/L (ref 15–41)
Albumin: 4.3 g/dL (ref 3.5–5.0)
Alkaline Phosphatase: 79 U/L (ref 38–126)
Anion gap: 8 (ref 5–15)
BUN: 14 mg/dL (ref 8–23)
CHLORIDE: 102 mmol/L (ref 98–111)
CO2: 27 mmol/L (ref 22–32)
Calcium: 9.6 mg/dL (ref 8.9–10.3)
Creatinine, Ser: 1.15 mg/dL (ref 0.61–1.24)
GFR calc non Af Amer: 60 mL/min — ABNORMAL LOW (ref >60)
Glucose, Bld: 83 mg/dL (ref 70–99)
POTASSIUM: 3.9 mmol/L (ref 3.5–5.1)
SODIUM: 137 mmol/L (ref 135–145)
Total Bilirubin: 0.8 mg/dL (ref 0.3–1.2)
Total Protein: 6.9 g/dL (ref 6.5–8.1)

## 2018-09-03 LAB — CBC WITH DIFFERENTIAL/PLATELET
Abs Immature Granulocytes: 0.01 10*3/uL (ref 0.00–0.07)
BASOS ABS: 0 10*3/uL (ref 0.0–0.1)
Basophils Relative: 0 %
EOS ABS: 0.1 10*3/uL (ref 0.0–0.5)
EOS PCT: 2 %
HCT: 42.3 % (ref 39.0–52.0)
Hemoglobin: 14.7 g/dL (ref 13.0–17.0)
IMMATURE GRANULOCYTES: 0 %
LYMPHS ABS: 0.9 10*3/uL (ref 0.7–4.0)
Lymphocytes Relative: 18 %
MCH: 33.7 pg (ref 26.0–34.0)
MCHC: 34.8 g/dL (ref 30.0–36.0)
MCV: 97 fL (ref 80.0–100.0)
Monocytes Absolute: 0.6 10*3/uL (ref 0.1–1.0)
Monocytes Relative: 11 %
NEUTROS PCT: 69 %
NRBC: 0 % (ref 0.0–0.2)
Neutro Abs: 3.4 10*3/uL (ref 1.7–7.7)
PLATELETS: 123 10*3/uL — AB (ref 150–400)
RBC: 4.36 MIL/uL (ref 4.22–5.81)
RDW: 15.4 % (ref 11.5–15.5)
WBC: 5 10*3/uL (ref 4.0–10.5)

## 2018-09-03 MED ORDER — INFLUENZA VAC SPLIT HIGH-DOSE 0.5 ML IM SUSY
0.5000 mL | PREFILLED_SYRINGE | Freq: Once | INTRAMUSCULAR | Status: AC
  Administered 2018-09-03: 0.5 mL via INTRAMUSCULAR
  Filled 2018-09-03: qty 0.5

## 2018-09-03 NOTE — Progress Notes (Signed)
[place] OFFICE PROGRESS NOTE  Patient Care Team: Dion Body, MD as PCP - General (Family Medicine) Jannet Mantis, MD (Dermatology) Bary Castilla, Forest Gleason, MD (General Surgery) Hollice Espy, MD as Consulting Physician (Urology) Cammie Sickle, MD as Medical Oncologist (Medical Oncology) Isaias Cowman, MD as Consulting Physician (Cardiology) Harriette Bouillon, MD as Referring Physician (Cardiology)  Cancer Staging No matching staging information was found for the patient.   Oncology History   # MAY- June 2017- METASTATIC CLEAR CELL; LEFT RENAL CA/STAGE IV; [place]- G-3; INTERMEDIATE RISK;  [bil Pul lung nodules;incidental s/p Bx Dr.Byrnett/Dr.Oaks] s/p Cytoreductive nephrectomy; Dr.Brandon- [id number]; July 11th 2017 CT- Enlarging Lung nodules  # Aug 7th 2017- PAZOPANIB; STOP SEP 2017 [pancreatitis/poor tol]  # OCT 1 week- START NIVO q 2W x4; DEC 8th CT- "Progression"; Continue Opdivo;APRIL-MAY 2018- STABLE LUNG NODULES [stopped sec to severe cough; NO Pneumonitis]  # May 31st 2018- Cabo 34m/day; July 31st CT lung- improved lung nodules; Sep 1st week- cabo- 417mday [dose reduced sec to mult intol]; Oct 1st 2week-Cabo 2013may;   # NOV 15th 2018- CT chest- slight progression of lung nodules [poor tol to higer doses];   # Nov 28th 2018- SUTENT 50 mg 2w-on  &1 w-OFF;  # MARCH 2019- Progression; start Axitinib 5 mg BID; July 2019- MIXED response; worsening/new skeletal lesions/stable to improved lung lesions; STOP Axitinib;   # Left hip s/p RT [mid-AUG 2019]  # AUG 12th- RE-START CABO 40 mg/day; STOP SEP 2019- ec to intol [abdominal pain even on 77m47my]; [facility] q W  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];STAGE I  0.42 millimeter depth; s/p WLE [Dr.Byrnett]  ---------------------------------------------------------    # March 2019-  Molecular testing- PDL-1 NEG; TMB-Low; MSI-STABLE; No targets; VHL mutation of  unknown significance  -----------------------------------------------------   # Dx; Kidney cancer/ clear cell STAGE IV  Current treatment: CABO [AUG 12th 2019]  Goal: Palliative     Cancer of left kidney excluding renal pelvis (HCC)[place] -  Chemotherapy    The patient had temsirolimus (TORISEL) 25 mg in sodium chloride 0.9 % 250 mL chemo infusion, 25 mg, Intravenous,  Once, 0 of 4 cycles  for chemotherapy treatment.        INTERVAL HISTORY:  G[place] BONSERy36.  male pleasant patient above history of metastatic renal cell cancer most recently on [place] for follow-up.  Patient's CaboEliane Dec[place]on hold for the last 2 weeks given the possible etiology of patient's abdominal pain.  Patient states his abdominal pain is resolved.  Denies any cramping.  He feels back to baseline.  No diarrhea no vomiting.  He denies any pain.  He does complain of fatigue.  Review of Systems  Constitutional: Positive for malaise/fatigue. Negative for chills, diaphoresis and fever.  HENT: Negative for nosebleeds and sore throat.   Eyes: Negative for double vision.  Respiratory: Negative for cough, hemoptysis, sputum production, shortness of breath and wheezing.   Cardiovascular: Negative for chest pain, palpitations, orthopnea and leg swelling.  Gastrointestinal: Negative for blood in stool, constipation, heartburn, melena and vomiting.  Genitourinary: Negative for dysuria, frequency and urgency.  Musculoskeletal: Negative for joint pain.  Skin: Negative.  Negative for itching and rash.  Neurological: Negative for tingling, focal weakness, weakness and headaches.  Endo/Heme/Allergies: Does not bruise/bleed easily.  Psychiatric/Behavioral: Negative for depression. The patient is not nervous/anxious and  does not have insomnia.       PAST MEDICAL HISTORY :  Past Medical History:  Diagnosis Date  . CAD (coronary  artery disease)   . Cancer ([place])    left arm  . COPD (chronic obstructive pulmonary disease) ([place])   . Hypertension   . Kidney stone   . Lung cancer ([place])   . Melanoma ([place]) 01/24/2016   right shoulder  . Thyroid nodule 02/02/2016   left BENIGN THYROID NODULE by FNA    PAST SURGICAL HISTORY :   Past Surgical History:  Procedure Laterality Date  . arm surgery Left    arm  . cardiac stents  2011   Angioplasty / Stenting Femoral-X2  . COLONOSCOPY  04/01/12  . CORONARY ANGIOPLASTY    . EXCISION MELANOMA WITH SENTINEL LYMPH NODE BIOPSY Right 01/24/2016   Procedure: EXCISION MELANOMA Right Shoulder;  Surgeon: Robert Bellow, MD;  Location: [facility] ORS;  Service: General;  Laterality: Right;  . LAPAROSCOPIC NEPHRECTOMY, HAND ASSISTED Left 04/24/2016   Procedure: HAND ASSISTED LAPAROSCOPIC NEPHRECTOMY;  Surgeon: Hollice Espy, MD;  Location: [facility] ORS;  Service: Urology;  Laterality: Left;  Marland Kitchen VIDEO ASSISTED THORACOSCOPY (VATS)/THOROCOTOMY Left 03/08/2016   Procedure: VIDEO ASSISTED THORACOSCOPY (VATS) with lung biopsy - Left ;  Surgeon: Robert Bellow, MD;  Location: [facility] ORS;  Service: General;  Laterality: Left;    FAMILY HISTORY :   Family History  Problem Relation Age of Onset  . Hodgkin's lymphoma Daughter 5  . Heart attack Father   . Kidney cancer Neg Hx   . Kidney disease Neg Hx   . Prostate cancer Neg Hx     SOCIAL HISTORY:   Social History   Tobacco Use  . Smoking status: Former Smoker    Packs/day: 2.00    Years: 30.00    Pack years: 60.00    Types: Cigarettes    Start date: 01/18/1956    Last attempt to quit: 01/17/1969    Years since quitting: 49.6  . Smokeless tobacco: Never Used  Substance Use Topics  . Alcohol use: No    Alcohol/week: 0.0 standard drinks    Comment: BEER OCC  . Drug use: No    ALLERGIES:  is allergic to lisinopril and other.  MEDICATIONS:  Current Outpatient Medications  Medication Sig Dispense Refill  . acetaminophen (TYLENOL) 500 MG  tablet Take 500 mg by mouth every 6 (six) hours as needed for moderate pain.    Marland Kitchen albuterol (PROVENTIL HFA;VENTOLIN HFA) 108 (90 Base) MCG/ACT inhaler Inhale 2 puffs into the lungs every 6 (six) hours as needed for wheezing or shortness of breath. 1 Inhaler 2  . aspirin EC 81 MG tablet Take 81 mg by mouth daily.    Marland Kitchen atorvastatin (LIPITOR) 40 MG tablet Take 40 mg by mouth daily.    . busPIRone (BUSPAR) 5 MG tablet Take 2 tablets by mouth daily.    . cholecalciferol (VITAMIN D) 400 units TABS tablet Take 400 Units by mouth daily.    . clopidogrel (PLAVIX) 75 MG tablet Take 75 mg by mouth daily.    . Coenzyme Q10 (COQ10) 100 MG CAPS Take 1 capsule by mouth daily.    Marland Kitchen dicyclomine (BENTYL) 20 MG tablet Take 1 tablet (20 mg total) by mouth 3 (three) times daily before meals. 60 tablet 0  . docusate sodium (COLACE) 100 MG capsule Take 1 capsule (100 mg total) by mouth 2 (two) times daily. 60 capsule 3  . DULoxetine (CYMBALTA) 30 MG  capsule Take 2 capsules by mouth daily.    . fluticasone (FLONASE) 50 MCG/ACT nasal spray Place 2 sprays into both nostrils daily. 16 g 2  . Fluticasone-Salmeterol (ADVAIR DISKUS) 500-50 MCG/DOSE AEPB Inhale 1 puff into the lungs 2 (two) times daily. 60 each 11  . ipratropium-albuterol (DUONEB) 0.5-2.5 (3) MG/3ML SOLN Take 3 mLs by nebulization every 4 (four) hours as needed. 360 mL 3  . loratadine (CLARITIN) 10 MG tablet Take 10 mg by mouth daily.    Marland Kitchen MELATONIN PO Take 10 mg by mouth at bedtime.     . Misc Natural Products (GLUCOSAMINE CHOND COMPLEX/MSM) TABS Take 2 tablets by mouth daily.    . montelukast (SINGULAIR) 10 MG tablet TAKE 1 TABLET BY MOUTH EVERYDAY AT BEDTIME 30 tablet 3  . Omega-3 Fatty Acids (FISH OIL) 1000 MG CPDR Take 1 capsule by mouth daily.    Marland Kitchen oxyCODONE (OXY IR/ROXICODONE) 5 MG immediate release tablet 1 pill every 12 hours as needed for pain 40 tablet 0  . OXYGEN Inhale 2 L into the lungs at bedtime.    . tamsulosin (FLOMAX) 0.4 MG CAPS capsule  TAKE 1 CAPSULE (0.4 MG TOTAL) BY MOUTH EVERY MORNING. 30 capsule 3  . Tiotropium Bromide Monohydrate (SPIRIVA RESPIMAT) 1.25 MCG/ACT AERS Inhale 1.25 Act into the lungs 2 (two) times daily. 1 Inhaler 11  . Turmeric 500 MG TABS Take 1 tablet by mouth daily.    . diphenoxylate-atropine (LOMOTIL) 2.5-0.025 MG tablet Take 2 tablets by mouth 4 (four) times daily as needed for diarrhea or loose stools. (Patient not taking: Reported on 08/08/2018) 30 tablet 0  . dronabinol (MARINOL) 2.5 MG capsule Take 1 capsule (2.5 mg total) by mouth 2 (two) times daily before a meal. (Patient not taking: Reported on 09/03/2018) 60 capsule 1  . ferrous sulfate 325 (65 FE) MG EC tablet Take 325 mg by mouth 3 (three) times daily with meals.    Marland Kitchen loperamide (IMODIUM) 1 MG/5ML solution Take 4 mg by mouth as needed for diarrhea or loose stools.    . nitroGLYCERIN (NITROSTAT) 0.4 MG SL tablet Place 0.4 mg under the tongue every 5 (five) minutes as needed.     . pyridostigmine (MESTINON) 60 MG tablet Take 1 tablet by mouth 2 (two) times daily.    Marland Kitchen tiZANidine (ZANAFLEX) 4 MG tablet Take 4 mg by mouth daily.      No current facility-administered medications for this visit.     PHYSICAL EXAMINATION: ECOG PERFORMANCE STATUS: 1 - Symptomatic but completely ambulatory  BP (!) 163/88   Pulse 80   Temp 97.6 F (36.4 C) (Tympanic)   Resp 20   There were no vitals filed for this visit.  Physical Exam  Constitutional: He is oriented to person, place, and time and well-developed, well-nourished, and in no distress.  Accompanied by his wife/daughter. He is walking by himself.  HENT:  Head: Normocephalic and atraumatic.  Mouth/Throat: Oropharynx is clear and moist. No oropharyngeal exudate.  Eyes: Pupils are equal, round, and reactive to light.  Neck: Normal range of motion. Neck supple.  Cardiovascular: Normal rate and regular rhythm.  Pulmonary/Chest: No respiratory distress. He has no wheezes.  Abdominal: Soft. Bowel  sounds are normal. He exhibits no distension and no mass. There is no guarding.  Musculoskeletal: Normal range of motion. He exhibits no edema or tenderness.  Neurological: He is alert and oriented to person, place, and time.  Skin: Skin is warm.  Psychiatric: Affect normal.  LABORATORY DATA:  I have reviewed the data as listed    Component Value Date/Time   NA 137 09/03/2018 1120   NA 146 (H) 05/25/2016 1514   K 3.9 09/03/2018 1120   CL 102 09/03/2018 1120   CO2 27 09/03/2018 1120   GLUCOSE 83 09/03/2018 1120   BUN 14 09/03/2018 1120   BUN 19 05/25/2016 1514   CREATININE 1.15 09/03/2018 1120   CALCIUM 9.6 09/03/2018 1120   PROT 6.9 09/03/2018 1120   ALBUMIN 4.3 09/03/2018 1120   AST 25 09/03/2018 1120   ALT 22 09/03/2018 1120   ALKPHOS 79 09/03/2018 1120   BILITOT 0.8 09/03/2018 1120   GFRNONAA 60 (L) 09/03/2018 1120   GFRAA >60 09/03/2018 1120    No results found for: SPEP, UPEP  Lab Results  Component Value Date   WBC 5.0 09/03/2018   NEUTROABS 3.4 09/03/2018   HGB 14.7 09/03/2018   HCT 42.3 09/03/2018   MCV 97.0 09/03/2018   PLT 123 (L) 09/03/2018      Chemistry      Component Value Date/Time   NA 137 09/03/2018 1120   NA 146 (H) 05/25/2016 1514   K 3.9 09/03/2018 1120   CL 102 09/03/2018 1120   CO2 27 09/03/2018 1120   BUN 14 09/03/2018 1120   BUN 19 05/25/2016 1514   CREATININE 1.15 09/03/2018 1120      Component Value Date/Time   CALCIUM 9.6 09/03/2018 1120   ALKPHOS 79 09/03/2018 1120   AST 25 09/03/2018 1120   ALT 22 09/03/2018 1120   BILITOT 0.8 09/03/2018 1120       RADIOGRAPHIC STUDIES: I have personally reviewed the radiological images as listed and agreed with the findings in the report. No results found.   ASSESSMENT & PLAN:  Cancer of left kidney excluding renal pelvis [facility])  # Left kidney cancer metastatic to the lung- June 2019 CT scan shows stable to improved lung lesions; however multiple lytic lesions in the bones.-  MIXED RESPONSE.   # Patient has poor tolerance/ to cabo-even to 20 mg pill./Family reluctant to try cabo again.  Discontinue cabo.   # Given the previous intolerance to multiple TKIs-poorly controlled blood pressure; hand-foot syndrome extreme fatigue-I would recommend a trial of Torisel weekly.  #Discussed potential side effects include skin rash hyperglycemia hyperlipidemia diarrhea etc.  Discussed treatments are palliative.  Not curative  # Multiple lytic lesions in the bones/left hip-on Xgeva s/p RT-pain improved.STABLE.   #Severe fatigue-recommend checking TSH.  #Orthostatic hypotension- STABLE HOLD mestinon.  # flu shot today  #1week/labs- cbc/cmp;ToriselJayme Cloud  # 2 weeks/labs-cbc/bmp/MD/ torisel-Dr.B  Cc; Dr.Harrison-     Orders Placed This Encounter  Procedures  . CBC with Differential    Standing Status:   Future    Number of Occurrences:   1    Standing Expiration Date:   09/04/2019  . Comprehensive metabolic panel    Standing Status:   Future    Number of Occurrences:   1    Standing Expiration Date:   09/04/2019  . Thyroid Panel With TSH    Standing Status:   Future    Number of Occurrences:   1    Standing Expiration Date:   09/04/2019   All questions were answered. The patient knows to call the clinic with any problems, questions or concerns.      Cammie Sickle, MD 09/03/2018 10:08 PM

## 2018-09-03 NOTE — Progress Notes (Signed)
DISCONTINUE ON PATHWAY REGIMEN - Renal Cell     A cycle is every 14 days:     Nivolumab        Dose Mod: None  **Always confirm dose/schedule in your pharmacy ordering system**  REASON: Disease Progression PRIOR TREATMENT: [id number]: Nivolumab 240 mg q14 Days TREATMENT RESPONSE: Progressive Disease (PD)  START ON PATHWAY REGIMEN - Renal Cell     Orally once daily:     Everolimus      Lenvatinib   **Always confirm dose/schedule in your pharmacy ordering system**  Patient Characteristics: Metastatic, Clear Cell, Second Line, Prior Checkpoint Inhibitor and Prior VEGF Inhibitor AJCC M Category: M1 AJCC 8 Stage Grouping: IV Current evidence of distant metastases<= Yes AJCC T Category: TX AJCC N Category: NX Does patient have oligometastatic disease<= No Histology: Clear Cell Line of Therapy: Second Line Intent of Therapy: Non-Curative / Palliative Intent, Discussed with Patient

## 2018-09-03 NOTE — Assessment & Plan Note (Addendum)
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   # Patient has poor tolerance/ to cabo-even to 20 mg pill./Family reluctant to try cabo again.  Discontinue cabo.   # Given the previous intolerance to multiple TKIs-poorly controlled blood pressure; hand-foot syndrome extreme fatigue-I would recommend a trial of Torisel weekly.  #Discussed potential side effects include skin rash hyperglycemia hyperlipidemia diarrhea etc.  Discussed treatments are palliative.  Not curative  # Multiple lytic lesions in the bones/left hip-on Xgeva s/p RT-pain improved.STABLE.   #Severe fatigue-recommend checking TSH.  #Orthostatic hypotension- STABLE HOLD mestinon.  # flu shot today  #1week/labs- cbc/cmp;Torisel; X-geva  # 2 weeks/labs-cbc/bmp/MD/ torisel-Dr.B  # 40 minutes face-to-face with the patient discussing the above plan of care; more than 50% of time spent on prognosis/ natural history; counseling and coordination.   Cc; Dr.Harrison-

## 2018-09-04 LAB — THYROID PANEL WITH TSH
Free Thyroxine Index: 1.5 (ref 1.2–4.9)
T3 Uptake Ratio: 27 % (ref 24–39)
T4 TOTAL: 5.7 ug/dL (ref 4.5–12.0)
TSH: 4.38 u[IU]/mL (ref 0.450–4.500)

## 2018-09-09 ENCOUNTER — Other Ambulatory Visit: Payer: Self-pay | Admitting: Internal Medicine

## 2018-09-09 NOTE — Progress Notes (Signed)
torisel orders are signed.

## 2018-09-10 ENCOUNTER — Other Ambulatory Visit: Payer: Self-pay

## 2018-09-10 ENCOUNTER — Inpatient Hospital Stay: Payer: PPO

## 2018-09-10 ENCOUNTER — Other Ambulatory Visit: Payer: PPO

## 2018-09-10 VITALS — BP 157/83 | HR 72 | Temp 97.0°F | Resp 20 | Wt 200.4 lb

## 2018-09-10 DIAGNOSIS — Z5111 Encounter for antineoplastic chemotherapy: Secondary | ICD-10-CM | POA: Diagnosis not present

## 2018-09-10 DIAGNOSIS — C642 Malignant neoplasm of left kidney, except renal pelvis: Secondary | ICD-10-CM

## 2018-09-10 DIAGNOSIS — C7951 Secondary malignant neoplasm of bone: Secondary | ICD-10-CM

## 2018-09-10 MED ORDER — ONDANSETRON HCL 8 MG PO TABS: 8.0000 mg | ORAL_TABLET | Freq: Three times a day (TID) | ORAL | 0 refills | Status: DC | PRN

## 2018-09-10 MED ORDER — TEMSIROLIMUS CHEMO INJECTION 25 MG/ML W/DILUENT
25.0000 mg | Freq: Once | INTRAVENOUS | Status: AC
  Administered 2018-09-10: 25 mg via INTRAVENOUS
  Filled 2018-09-10: qty 2.5

## 2018-09-10 MED ORDER — DIPHENHYDRAMINE HCL 50 MG/ML IJ SOLN
25.0000 mg | Freq: Once | INTRAMUSCULAR | Status: AC
  Administered 2018-09-10: 25 mg via INTRAVENOUS
  Filled 2018-09-10: qty 1

## 2018-09-10 MED ORDER — PROCHLORPERAZINE MALEATE 10 MG PO TABS
10.0000 mg | ORAL_TABLET | Freq: Once | ORAL | Status: AC
  Administered 2018-09-10: 10 mg via ORAL
  Filled 2018-09-10: qty 1

## 2018-09-10 MED ORDER — DENOSUMAB 120 MG/1.7ML ~~LOC~~ SOLN
120.0000 mg | Freq: Once | SUBCUTANEOUS | Status: AC
  Administered 2018-09-10: 120 mg via SUBCUTANEOUS

## 2018-09-10 MED ORDER — SODIUM CHLORIDE 0.9 % IV SOLN
Freq: Once | INTRAVENOUS | Status: AC
  Administered 2018-09-10: 12:00:00 via INTRAVENOUS
  Filled 2018-09-10: qty 250

## 2018-09-15 ENCOUNTER — Other Ambulatory Visit: Payer: Self-pay

## 2018-09-15 DIAGNOSIS — C7951 Secondary malignant neoplasm of bone: Secondary | ICD-10-CM

## 2018-09-15 DIAGNOSIS — C44622 Squamous cell carcinoma of skin of right upper limb, including shoulder: Secondary | ICD-10-CM

## 2018-09-17 ENCOUNTER — Inpatient Hospital Stay: Payer: PPO

## 2018-09-17 ENCOUNTER — Ambulatory Visit
Admission: RE | Admit: 2018-09-17 | Discharge: 2018-09-17 | Disposition: A | Discharge status: Home / Self Care | Payer: PPO | Source: Ambulatory Visit | Attending: Internal Medicine | Admitting: Internal Medicine

## 2018-09-17 ENCOUNTER — Inpatient Hospital Stay (HOSPITAL_BASED_OUTPATIENT_CLINIC_OR_DEPARTMENT_OTHER): Payer: PPO | Admitting: Internal Medicine

## 2018-09-17 VITALS — BP 155/95 | HR 90 | Temp 98.1°F | Resp 16 | Wt 196.0 lb

## 2018-09-17 DIAGNOSIS — Z5111 Encounter for antineoplastic chemotherapy: Secondary | ICD-10-CM | POA: Diagnosis not present

## 2018-09-17 DIAGNOSIS — E041 Nontoxic single thyroid nodule: Secondary | ICD-10-CM

## 2018-09-17 DIAGNOSIS — J449 Chronic obstructive pulmonary disease, unspecified: Secondary | ICD-10-CM

## 2018-09-17 DIAGNOSIS — R5383 Other fatigue: Secondary | ICD-10-CM

## 2018-09-17 DIAGNOSIS — M25512 Pain in left shoulder: Secondary | ICD-10-CM

## 2018-09-17 DIAGNOSIS — I951 Orthostatic hypotension: Secondary | ICD-10-CM | POA: Diagnosis not present

## 2018-09-17 DIAGNOSIS — I10 Essential (primary) hypertension: Secondary | ICD-10-CM

## 2018-09-17 DIAGNOSIS — R634 Abnormal weight loss: Secondary | ICD-10-CM | POA: Diagnosis not present

## 2018-09-17 DIAGNOSIS — Z87442 Personal history of urinary calculi: Secondary | ICD-10-CM

## 2018-09-17 DIAGNOSIS — Z79899 Other long term (current) drug therapy: Secondary | ICD-10-CM

## 2018-09-17 DIAGNOSIS — I2510 Atherosclerotic heart disease of native coronary artery without angina pectoris: Secondary | ICD-10-CM

## 2018-09-17 DIAGNOSIS — C7800 Secondary malignant neoplasm of unspecified lung: Secondary | ICD-10-CM | POA: Diagnosis not present

## 2018-09-17 DIAGNOSIS — Z23 Encounter for immunization: Secondary | ICD-10-CM | POA: Diagnosis not present

## 2018-09-17 DIAGNOSIS — R531 Weakness: Secondary | ICD-10-CM | POA: Diagnosis not present

## 2018-09-17 DIAGNOSIS — C642 Malignant neoplasm of left kidney, except renal pelvis: Secondary | ICD-10-CM | POA: Diagnosis not present

## 2018-09-17 DIAGNOSIS — Z85820 Personal history of malignant melanoma of skin: Secondary | ICD-10-CM

## 2018-09-17 DIAGNOSIS — M19012 Primary osteoarthritis, left shoulder: Secondary | ICD-10-CM | POA: Insufficient documentation

## 2018-09-17 DIAGNOSIS — Z87891 Personal history of nicotine dependence: Secondary | ICD-10-CM

## 2018-09-17 DIAGNOSIS — Z7982 Long term (current) use of aspirin: Secondary | ICD-10-CM

## 2018-09-17 DIAGNOSIS — R109 Unspecified abdominal pain: Secondary | ICD-10-CM

## 2018-09-17 DIAGNOSIS — C7951 Secondary malignant neoplasm of bone: Secondary | ICD-10-CM

## 2018-09-17 LAB — COMPREHENSIVE METABOLIC PANEL
ALK PHOS: 84 U/L (ref 38–126)
ALT: 18 U/L (ref 0–44)
ANION GAP: 9 (ref 5–15)
AST: 24 U/L (ref 15–41)
Albumin: 4.3 g/dL (ref 3.5–5.0)
BUN: 18 mg/dL (ref 8–23)
CALCIUM: 9.1 mg/dL (ref 8.9–10.3)
CO2: 27 mmol/L (ref 22–32)
CREATININE: 1.21 mg/dL (ref 0.61–1.24)
Chloride: 102 mmol/L (ref 98–111)
GFR calc Af Amer: >60 mL/min (ref >60)
GFR, EST NON AFRICAN AMERICAN: 56 mL/min — AB (ref >60)
Glucose, Bld: 210 mg/dL — ABNORMAL HIGH (ref 70–99)
Potassium: 4.1 mmol/L (ref 3.5–5.1)
SODIUM: 138 mmol/L (ref 135–145)
TOTAL PROTEIN: 7.3 g/dL (ref 6.5–8.1)
Total Bilirubin: 0.9 mg/dL (ref 0.3–1.2)

## 2018-09-17 LAB — CBC WITH DIFFERENTIAL/PLATELET
Abs Immature Granulocytes: 0 10*3/uL (ref 0.00–0.07)
BASOS ABS: 0 10*3/uL (ref 0.0–0.1)
BASOS PCT: 0 %
EOS ABS: 0.1 10*3/uL (ref 0.0–0.5)
EOS PCT: 2 %
HEMATOCRIT: 43.5 % (ref 39.0–52.0)
Hemoglobin: 15.2 g/dL (ref 13.0–17.0)
IMMATURE GRANULOCYTES: 0 %
Lymphocytes Relative: 16 %
Lymphs Abs: 0.9 10*3/uL (ref 0.7–4.0)
MCH: 33.9 pg (ref 26.0–34.0)
MCHC: 34.9 g/dL (ref 30.0–36.0)
MCV: 96.9 fL (ref 80.0–100.0)
Monocytes Absolute: 0.4 10*3/uL (ref 0.1–1.0)
Monocytes Relative: 8 %
NEUTROS PCT: 74 %
Neutro Abs: 4.1 10*3/uL (ref 1.7–7.7)
PLATELETS: 107 10*3/uL — AB (ref 150–400)
RBC: 4.49 MIL/uL (ref 4.22–5.81)
RDW: 13.4 % (ref 11.5–15.5)
WBC: 5.5 10*3/uL (ref 4.0–10.5)
nRBC: 0 % (ref 0.0–0.2)

## 2018-09-17 MED ORDER — PROCHLORPERAZINE MALEATE 10 MG PO TABS
10.0000 mg | ORAL_TABLET | Freq: Once | ORAL | Status: AC
  Administered 2018-09-17: 10 mg via ORAL
  Filled 2018-09-17: qty 1

## 2018-09-17 MED ORDER — OXYCODONE HCL 5 MG PO TABS: 5.0000 mg | ORAL_TABLET | Freq: Three times a day (TID) | ORAL | 0 refills | Status: DC | PRN

## 2018-09-17 MED ORDER — DIPHENHYDRAMINE HCL 50 MG/ML IJ SOLN
25.0000 mg | Freq: Once | INTRAMUSCULAR | Status: AC
  Administered 2018-09-17: 25 mg via INTRAVENOUS
  Filled 2018-09-17: qty 1

## 2018-09-17 MED ORDER — TEMSIROLIMUS CHEMO INJECTION 25 MG/ML W/DILUENT
25.0000 mg | Freq: Once | INTRAVENOUS | Status: AC
  Administered 2018-09-17: 25 mg via INTRAVENOUS
  Filled 2018-09-17: qty 2.5

## 2018-09-17 MED ORDER — SODIUM CHLORIDE 0.9 % IV SOLN
Freq: Once | INTRAVENOUS | Status: AC
  Administered 2018-09-17: 11:00:00 via INTRAVENOUS
  Filled 2018-09-17: qty 250

## 2018-09-17 NOTE — Assessment & Plan Note (Addendum)
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  Currently on Torisel.  #Proceed with cycle #2 of Torisel today; Labs today reviewed;  acceptable for treatment today.  # Multiple lytic lesions in the bones/left hip-on Xgeva s/p RT-pain improved. STABLE;[last Xgeva 10/23]  # left shoulder pain- ? Mets vs other- check X-rays today; if negative MRI/physical therapy.  # Oral ulcer- recommend oral gel; if not improved then- magic mouthwash.   DISPOSITION:  # Treatment today; X-rays today # 1week/labs- cbc/cmp;Torisel # 2 weeks/labs-cbc/bmp/MD/ torisel-Dr.B

## 2018-09-17 NOTE — Progress Notes (Signed)
[facility] OFF[facility] PROGRESS NOTE  Patient Care Team: Dion Body, MD as PCP - General (Family Medicine) Jannet Mantis, MD (Dermatology) Bary Castilla, Forest Gleason, MD (General Surgery) Hollice Espy, MD as Consulting Physician (Urology) Cammie Sickle, MD as Medical Oncologist (Medical Oncology) Isaias Cowman, MD as Consulting Physician (Cardiology) Harriette Bouillon, MD as Referring Physician (Cardiology)  Cancer Staging No matching staging information was found for the patient.   Oncology History   # MAY- June 2017- METASTATIC CLEAR CELL; LEFT RENAL CA/STAGE IV; [place]- G-3; INTERMEDIATE RISK;  [bil Pul lung nodules;incidental s/p Bx Dr.Byrnett/Dr.Oaks] s/p Cytoreductive nephrectomy; Dr.Brandon- [id number]; July 11th 2017 CT- Enlarging Lung nodules  # Aug 7th 2017- PAZOPANIB; STOP SEP 2017 [pancreatitis/poor tol]  # OCT 1 week- START NIVO q 2W x4; DEC 8th CT- "Progression"; Continue Opdivo;APRIL-MAY 2018- STABLE LUNG NODULES [stopped sec to severe cough; NO Pneumonitis]  # May 31st 2018- Cabo 31m/day; July 31st CT lung- improved lung nodules; Sep 1st week- cabo- 476mday [dose reduced sec to mult intol]; Oct 1st 2week-Cabo 2073may;   # NOV 15th 2018- CT chest- slight progression of lung nodules [poor tol to higer doses];   # Nov 28th 2018- SUTENT 50 mg 2w-on  &1 w-OFF;  # MARCH 2019- Progression; start Axitinib 5 mg BID; July 2019- MIXED response; worsening/new skeletal lesions/stable to improved lung lesions; STOP Axitinib;   # Left hip s/p RT [mid-AUG 2019]  # AUG 12th- RE-START CABO 40 mg/day; STOP SEP 2019- sec to intol [abdominal pain even on 9m60my]; [facility] q W  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];STAGE I  0.42 millimeter depth; s/p WLE [Dr.Byrnett]  ---------------------------------------------------------    # March 2019-  Molecular testing- PDL-1 NEG; TMB-Low; MSI-STABLE; No targets; VHL mutation of  unknown significance  -----------------------------------------------------   # Dx; Kidney cancer/ clear cell STAGE IV  Current treatment: Torisel [oct 23rd ]  Goal: Palliative     Cancer of left kidney excluding renal pelvis (HCC)[place] -  Chemotherapy    The patient had temsirolimus (TORISEL) 25 mg in sodium chloride 0.9 % 250 mL chemo infusion, 25 mg, Intravenous,  Once, 1 of 7 cycles Administration: 25 mg (09/10/2018)  for chemotherapy treatment.        INTERVAL HISTORY:  Glen DENTINGERy11.  male pleasant patient above history of metastatic renal cell cancer currently on Torisel status post cycle #1 is here for follow-up.  Since stopping the Cabo-patient's abdominal pain has resolved.   However he does complain of left shoulder pain especially with movement sharp pain-for the last 2 weeks.  Radiates to the left arm.  No weakness in the arm.  Denies any trauma.  Review of Systems  Constitutional: Positive for malaise/fatigue. Negative for chills, diaphoresis and fever.  HENT: Negative for nosebleeds and sore throat.   Eyes: Negative for double vision.  Respiratory: Negative for cough, hemoptysis, sputum production, shortness of breath and wheezing.   Cardiovascular: Negative for chest pain, palpitations, orthopnea and leg swelling.  Gastrointestinal: Negative for blood in stool, constipation, heartburn, melena and vomiting.  Genitourinary: Negative for dysuria, frequency and urgency.  Musculoskeletal: Positive for joint pain (left shoulder pain).  Skin: Negative.  Negative for itching and rash.  Neurological: Positive for headaches. Negative for tingling, focal weakness and weakness.  Endo/Heme/Allergies: Does not bruise/bleed easily.  Psychiatric/Behavioral: Negative for depression. The patient is  not nervous/anxious and does not have insomnia.       PAST MEDICAL HISTORY :  Past  Medical History:  Diagnosis Date  . CAD (coronary artery disease)   . Cancer ([place])    left arm  . COPD (chronic obstructive pulmonary disease) ([place])   . Hypertension   . Kidney stone   . Lung cancer ([place])   . Melanoma ([place]) 01/24/2016   right shoulder  . Thyroid nodule 02/02/2016   left BENIGN THYROID NODULE by [facility]NA    PAST SURGICAL HISTORY :   Past Surgical History:  Procedure Laterality Date  . arm surgery Left    arm  . cardiac stents  2011   Angioplasty / Stenting Femoral-X2  . COLONOSCOPY  04/01/12  . CORONARY ANGIOPLASTY    . EXCISION MELANOMA WITH SENTINEL LYMPH NODE BIOPSY Right 01/24/2016   Procedure: EXCISION MELANOMA Right Shoulder;  Surgeon: Robert [facility], MD;  Location: [facility] ORS;  Service: General;  Laterality: Right;  . LAPAROSCOPIC NEPHRECTOMY, HAND ASSISTED Left 04/24/2016   Procedure: HAND ASSISTED LAPAROSCOPIC NEPHRECTOMY;  Surgeon: Hollice Espy, MD;  Location: [facility] ORS;  Service: Urology;  Laterality: Left;  Marland Kitchen VIDEO ASSISTED THORACOSCOPY (VATS)/THOROCOTOMY Left 03/08/2016   Procedure: VIDEO ASSISTED THORACOSCOPY (VATS) with lung biopsy - Left ;  Surgeon: Robert [facility], MD;  Location: [facility] ORS;  Service: General;  Laterality: Left;    FAMILY HISTORY :   Family History  Problem Relation Age of Onset  . Hodgkin's lymphoma Daughter 5  . Heart attack Father   . Kidney cancer Neg Hx   . Kidney disease Neg Hx   . Prostate cancer Neg Hx     SOCIAL HISTORY:   Social History   Tobacco Use  . Smoking status: Former Smoker    Packs/day: 2.00    Years: 30.00    Pack years: 60.00    Types: Cigarettes    Start date: 01/18/1956    Last attempt to quit: 01/17/1969    Years since quitting: 49.6  . Smokeless tobacco: Never Used  Substance Use Topics  . Alcohol use: No    Alcohol/week: 0.0 standard drinks    Comment: BEER OCC  . Drug use: No    ALLERGIES:  is allergic to lisinopril and other.  MEDICATIONS:  Current Outpatient Medications  Medication Sig  Dispense Refill  . acetaminophen (TYLENOL) 500 MG tablet Take 500 mg by mouth every 6 (six) hours as needed for moderate pain.    Marland Kitchen albuterol (PROVENTIL HFA;VENTOLIN HFA) 108 (90 Base) MCG/ACT inhaler Inhale 2 puffs into the lungs every 6 (six) hours as needed for wheezing or shortness of breath. 1 Inhaler 2  . aspirin EC 81 MG tablet Take 81 mg by mouth daily.    Marland Kitchen atorvastatin (LIPITOR) 40 MG tablet Take 40 mg by mouth daily.    . busPIRone (BUSPAR) 5 MG tablet Take 2 tablets by mouth daily.    . cholecalciferol (VITAMIN D) 400 units TABS tablet Take 400 Units by mouth daily.    . clopidogrel (PLAVIX) 75 MG tablet Take 75 mg by mouth daily.    . Coenzyme Q10 (COQ10) 100 MG CAPS Take 1 capsule by mouth daily.    Marland Kitchen dicyclomine (BENTYL) 20 MG tablet Take 1 tablet (20 mg total) by mouth 3 (three) times daily before meals. 60 tablet 0  . docusate sodium (COLACE) 100 MG capsule Take 1 capsule (100 mg total) by mouth 2 (two) times daily. 60 capsule 3  . dronabinol (  MARINOL) 2.5 MG capsule Take 1 capsule (2.5 mg total) by mouth 2 (two) times daily before a meal. 60 capsule 1  . ferrous sulfate 325 (65 FE) MG EC tablet Take 325 mg by mouth 3 (three) times daily with meals.    . fluticasone (FLONASE) 50 MCG/ACT nasal spray Place 2 sprays into both nostrils daily. 16 g 2  . Fluticasone-Salmeterol (ADVAIR DISKUS) 500-50 MCG/DOSE AEPB Inhale 1 puff into the lungs 2 (two) times daily. 60 each 11  . ipratropium-albuterol (DUONEB) 0.5-2.5 (3) MG/3ML SOLN Take 3 mLs by nebulization every 4 (four) hours as needed. 360 mL 3  . loperamide (IMODIUM) 1 MG/5ML solution Take 4 mg by mouth as needed for diarrhea or loose stools.    Marland Kitchen loratadine (CLARITIN) 10 MG tablet Take 10 mg by mouth daily.    Marland Kitchen MELATONIN PO Take 10 mg by mouth at bedtime.     . Misc Natural Products (GLUCOSAMINE CHOND COMPLEX/MSM) TABS Take 2 tablets by mouth daily.    . montelukast (SINGULAIR) 10 MG tablet TAKE 1 TABLET BY MOUTH EVERYDAY AT  BEDTIME 30 tablet 3  . nitroGLYCERIN (NITROSTAT) 0.4 MG SL tablet Place 0.4 mg under the tongue every 5 (five) minutes as needed.     . Omega-3 Fatty Acids (FISH OIL) 1000 MG CPDR Take 1 capsule by mouth daily.    . ondansetron (ZOFRAN) 8 MG tablet Take 1 tablet (8 mg total) by mouth every 8 (eight) hours as needed for nausea or vomiting. 60 tablet 0  . oxyCODONE (OXY IR/ROXICODONE) 5 MG immediate release tablet Take 1 tablet (5 mg total) by mouth every 8 (eight) hours as needed for severe pain. 1 pill every 12 hours as needed for pain 45 tablet 0  . OXYGEN Inhale 2 L into the lungs at bedtime.    . pyridostigmine (MESTINON) 60 MG tablet Take 1 tablet by mouth 2 (two) times daily.    . tamsulosin (FLOMAX) 0.4 MG CAPS capsule TAKE 1 CAPSULE (0.4 MG TOTAL) BY MOUTH EVERY MORNING. 30 capsule 3  . Tiotropium Bromide Monohydrate (SPIRIVA RESPIMAT) 1.25 MCG/ACT AERS Inhale 1.25 Act into the lungs 2 (two) times daily. 1 Inhaler 11  . tiZANidine (ZANAFLEX) 4 MG tablet Take 4 mg by mouth daily.     . Turmeric 500 MG TABS Take 1 tablet by mouth daily.    . diphenoxylate-atropine (LOMOTIL) 2.5-0.025 MG tablet Take 2 tablets by mouth 4 (four) times daily as needed for diarrhea or loose stools. (Patient not taking: Reported on 09/17/2018) 30 tablet 0  . DULoxetine (CYMBALTA) 30 MG capsule Take 2 capsules by mouth daily.     No current facility-administered medications for this visit.     PHYSICAL EXAMINATION: ECOG PERFORMANCE STATUS: 1 - Symptomatic but completely ambulatory  BP (!) 155/95 (BP Location: Left Arm, Patient Position: Sitting)   Pulse 90   Temp 98.1 F (36.7 C) (Tympanic)   Resp 16   Wt 196 lb (88.9 kg)   BMI 25.86 kg/m   Filed Weights   09/17/18 0950  Weight: 196 lb (88.9 kg)    Physical Exam  Constitutional: He is oriented to person, place, and time and well-developed, well-nourished, and in no distress.  Accompanied by his wife/daughter. He is walking by himself.  HENT:   Head: Normocephalic and atraumatic.  Mouth/Throat: Oropharynx is clear and moist. No oropharyngeal exudate.  Eyes: Pupils are equal, round, and reactive to light.  Neck: Normal range of motion. Neck supple.  Cardiovascular: Normal  rate and regular rhythm.  Pulmonary/Chest: No respiratory distress. He has no wheezes.  Abdominal: Soft. Bowel sounds are normal. He exhibits no distension and no mass. There is no guarding.  Musculoskeletal: He exhibits no edema or tenderness.  Left shoulder decreased range of motion pain with passive and active movements.  Neurological: He is alert and oriented to person, place, and time.  Skin: Skin is warm.  Psychiatric: Affect normal.       LABORATORY DATA:  I have reviewed the data as listed    Component Value Date/Time   NA 138 09/17/2018 0921   NA 146 (H) 05/25/2016 1514   K 4.1 09/17/2018 0921   CL 102 09/17/2018 0921   CO2 27 09/17/2018 0921   GLUCOSE 210 (H) 09/17/2018 0921   BUN 18 09/17/2018 0921   BUN 19 05/25/2016 1514   CREATININE 1.21 09/17/2018 0921   CALCIUM 9.1 09/17/2018 0921   PROT 7.3 09/17/2018 0921   ALBUMIN 4.3 09/17/2018 0921   AST 24 09/17/2018 0921   ALT 18 09/17/2018 0921   ALKPHOS 84 09/17/2018 0921   BILITOT 0.9 09/17/2018 0921   GFRNONAA 56 (L) 09/17/2018 0921   GFRAA >60 09/17/2018 0921    No results found for: SPEP, UPEP  Lab Results  Component Value Date   WBC 5.5 09/17/2018   NEUTROABS 4.1 09/17/2018   HGB 15.2 09/17/2018   HCT 43.5 09/17/2018   MCV 96.9 09/17/2018   PLT 107 (L) 09/17/2018      Chemistry      Component Value Date/Time   NA 138 09/17/2018 0921   NA 146 (H) 05/25/2016 1514   K 4.1 09/17/2018 0921   CL 102 09/17/2018 0921   CO2 27 09/17/2018 0921   BUN 18 09/17/2018 0921   BUN 19 05/25/2016 1514   CREATININE 1.21 09/17/2018 0921      Component Value Date/Time   CALCIUM 9.1 09/17/2018 0921   ALKPHOS 84 09/17/2018 0921   AST 24 09/17/2018 0921   ALT 18 09/17/2018 0921    BILITOT 0.9 09/17/2018 0921       RADIOGRAPHIC STUDIES: I have personally reviewed the radiological images as listed and agreed with the findings in the report. No results found.   ASSESSMENT & PLAN:  Cancer of left kidney excluding renal pelvis [facility])  # Left kidney cancer metastatic to the lung- June 2019 CT scan shows stable to improved lung lesions; however multiple lytic lesions in the bones.- MIXED RESPONSE.  Currently on Torisel.  #Proceed with cycle #2 of Torisel today; Labs today reviewed;  acceptable for treatment today.  # Multiple lytic lesions in the bones/left hip-on Xgeva s/p RT-pain improved. STABLE;[last Xgeva 10/23]  # left shoulder pain- ? Mets vs other- check X-rays today; if negative MRI/physical therapy.  # Oral ulcer- recommend oral gel; if not improved then- magic mouthwash.   DISPOSITION:  # Treatment today; X-rays today # 1week/labs- cbc/cmp;Torisel # 2 weeks/labs-cbc/bmp/MD/ torisel-Dr.B   Orders Placed This Encounter  Procedures  . DG Shoulder Left    Standing Status:   Future    Standing Expiration Date:   11/18/2019    Order Specific Question:   Reason for Exam (SYMPTOM  OR DIAGNOSIS REQUIRED)    Answer:   left shoudler pain    Order Specific Question:   Preferred imaging location?    Answer:   [facility]    Order Specific Question:   Radiology Contrast Protocol - do NOT remove file path  Answer:   [email or web address]   All questions were answered. The patient knows to call the clinic with any problems, questions or concerns.      Cammie Sickle, MD 09/17/2018 10:45 AM

## 2018-09-18 ENCOUNTER — Telehealth: Payer: Self-pay | Admitting: Internal Medicine

## 2018-09-18 NOTE — Telephone Encounter (Signed)
Patient has been notified of these results and verbalized understanding.   I asked patient if he would be interested in PT and he states that he wants to wait a little while on the PT referral, and see if it get's better on his own. He states he will call us if his pain does not improve.

## 2018-09-18 NOTE — Telephone Encounter (Signed)
[facility]-please inform patient that his x-rays of the left shoulder shows arthritis-no obvious evidence of cancer.  I would recommend physical therapy; please refer if patient is interested.

## 2018-09-19 ENCOUNTER — Other Ambulatory Visit: Payer: Self-pay | Admitting: Internal Medicine

## 2018-09-19 DIAGNOSIS — C7951 Secondary malignant neoplasm of bone: Secondary | ICD-10-CM

## 2018-09-19 DIAGNOSIS — R0602 Shortness of breath: Secondary | ICD-10-CM | POA: Diagnosis not present

## 2018-09-19 DIAGNOSIS — R05 Cough: Secondary | ICD-10-CM | POA: Diagnosis not present

## 2018-09-19 DIAGNOSIS — C642 Malignant neoplasm of left kidney, except renal pelvis: Secondary | ICD-10-CM | POA: Diagnosis not present

## 2018-09-19 DIAGNOSIS — J449 Chronic obstructive pulmonary disease, unspecified: Secondary | ICD-10-CM | POA: Diagnosis not present

## 2018-09-24 ENCOUNTER — Inpatient Hospital Stay: Payer: PPO | Attending: Internal Medicine

## 2018-09-24 ENCOUNTER — Other Ambulatory Visit: Payer: Self-pay | Admitting: Internal Medicine

## 2018-09-24 ENCOUNTER — Inpatient Hospital Stay: Payer: PPO

## 2018-09-24 VITALS — BP 133/83 | HR 76 | Temp 97.6°F | Resp 18 | Wt 197.0 lb

## 2018-09-24 DIAGNOSIS — R739 Hyperglycemia, unspecified: Secondary | ICD-10-CM | POA: Insufficient documentation

## 2018-09-24 DIAGNOSIS — Z79899 Other long term (current) drug therapy: Secondary | ICD-10-CM | POA: Insufficient documentation

## 2018-09-24 DIAGNOSIS — C7802 Secondary malignant neoplasm of left lung: Secondary | ICD-10-CM | POA: Insufficient documentation

## 2018-09-24 DIAGNOSIS — R531 Weakness: Secondary | ICD-10-CM | POA: Insufficient documentation

## 2018-09-24 DIAGNOSIS — Z7689 Persons encountering health services in other specified circumstances: Secondary | ICD-10-CM | POA: Diagnosis not present

## 2018-09-24 DIAGNOSIS — Z87442 Personal history of urinary calculi: Secondary | ICD-10-CM | POA: Diagnosis not present

## 2018-09-24 DIAGNOSIS — E041 Nontoxic single thyroid nodule: Secondary | ICD-10-CM | POA: Insufficient documentation

## 2018-09-24 DIAGNOSIS — R5383 Other fatigue: Secondary | ICD-10-CM | POA: Insufficient documentation

## 2018-09-24 DIAGNOSIS — C7801 Secondary malignant neoplasm of right lung: Secondary | ICD-10-CM | POA: Insufficient documentation

## 2018-09-24 DIAGNOSIS — Z5111 Encounter for antineoplastic chemotherapy: Secondary | ICD-10-CM | POA: Diagnosis not present

## 2018-09-24 DIAGNOSIS — C7951 Secondary malignant neoplasm of bone: Secondary | ICD-10-CM | POA: Insufficient documentation

## 2018-09-24 DIAGNOSIS — C642 Malignant neoplasm of left kidney, except renal pelvis: Secondary | ICD-10-CM

## 2018-09-24 DIAGNOSIS — J449 Chronic obstructive pulmonary disease, unspecified: Secondary | ICD-10-CM | POA: Insufficient documentation

## 2018-09-24 DIAGNOSIS — I10 Essential (primary) hypertension: Secondary | ICD-10-CM | POA: Insufficient documentation

## 2018-09-24 DIAGNOSIS — R0781 Pleurodynia: Secondary | ICD-10-CM | POA: Insufficient documentation

## 2018-09-24 DIAGNOSIS — Z7982 Long term (current) use of aspirin: Secondary | ICD-10-CM | POA: Diagnosis not present

## 2018-09-24 DIAGNOSIS — M25512 Pain in left shoulder: Secondary | ICD-10-CM | POA: Diagnosis not present

## 2018-09-24 DIAGNOSIS — S4292XA Fracture of left shoulder girdle, part unspecified, initial encounter for closed fracture: Secondary | ICD-10-CM | POA: Insufficient documentation

## 2018-09-24 DIAGNOSIS — I2510 Atherosclerotic heart disease of native coronary artery without angina pectoris: Secondary | ICD-10-CM | POA: Diagnosis not present

## 2018-09-24 DIAGNOSIS — Z87891 Personal history of nicotine dependence: Secondary | ICD-10-CM | POA: Insufficient documentation

## 2018-09-24 DIAGNOSIS — Z85820 Personal history of malignant melanoma of skin: Secondary | ICD-10-CM | POA: Diagnosis not present

## 2018-09-24 LAB — COMPREHENSIVE METABOLIC PANEL
ALK PHOS: 83 U/L (ref 38–126)
ALT: 19 U/L (ref 0–44)
AST: 22 U/L (ref 15–41)
Albumin: 4 g/dL (ref 3.5–5.0)
Anion gap: 5 (ref 5–15)
BUN: 12 mg/dL (ref 8–23)
CALCIUM: 9.4 mg/dL (ref 8.9–10.3)
CHLORIDE: 98 mmol/L (ref 98–111)
CO2: 26 mmol/L (ref 22–32)
Creatinine, Ser: 1.16 mg/dL (ref 0.61–1.24)
GFR, EST NON AFRICAN AMERICAN: 59 mL/min — AB (ref >60)
Glucose, Bld: 129 mg/dL — ABNORMAL HIGH (ref 70–99)
Potassium: 4.1 mmol/L (ref 3.5–5.1)
Sodium: 129 mmol/L — ABNORMAL LOW (ref 135–145)
Total Bilirubin: 0.6 mg/dL (ref 0.3–1.2)
Total Protein: 6.8 g/dL (ref 6.5–8.1)

## 2018-09-24 LAB — CBC WITH DIFFERENTIAL/PLATELET
Abs Immature Granulocytes: 0.01 10*3/uL (ref 0.00–0.07)
Basophils Absolute: 0 10*3/uL (ref 0.0–0.1)
Basophils Relative: 0 %
Eosinophils Absolute: 0.2 10*3/uL (ref 0.0–0.5)
Eosinophils Relative: 3 %
HCT: 38.5 % — ABNORMAL LOW (ref 39.0–52.0)
HEMOGLOBIN: 13.6 g/dL (ref 13.0–17.0)
Immature Granulocytes: 0 %
LYMPHS PCT: 17 %
Lymphs Abs: 0.9 10*3/uL (ref 0.7–4.0)
MCH: 33.8 pg (ref 26.0–34.0)
MCHC: 35.3 g/dL (ref 30.0–36.0)
MCV: 95.8 fL (ref 80.0–100.0)
MONOS PCT: 17 %
Monocytes Absolute: 0.9 10*3/uL (ref 0.1–1.0)
NEUTROS ABS: 3.3 10*3/uL (ref 1.7–7.7)
Neutrophils Relative %: 63 %
Platelets: 98 10*3/uL — ABNORMAL LOW (ref 150–400)
RBC: 4.02 MIL/uL — ABNORMAL LOW (ref 4.22–5.81)
RDW: 12.7 % (ref 11.5–15.5)
WBC: 5.3 10*3/uL (ref 4.0–10.5)
nRBC: 0 % (ref 0.0–0.2)

## 2018-09-24 MED ORDER — TEMSIROLIMUS CHEMO INJECTION 25 MG/ML W/DILUENT
25.0000 mg | Freq: Once | INTRAVENOUS | Status: AC
  Administered 2018-09-24: 25 mg via INTRAVENOUS
  Filled 2018-09-24: qty 2.5

## 2018-09-24 MED ORDER — PROCHLORPERAZINE MALEATE 10 MG PO TABS
10.0000 mg | ORAL_TABLET | Freq: Once | ORAL | Status: AC
  Administered 2018-09-24: 10 mg via ORAL
  Filled 2018-09-24: qty 1

## 2018-09-24 MED ORDER — SODIUM CHLORIDE 0.9 % IV SOLN
Freq: Once | INTRAVENOUS | Status: AC
  Administered 2018-09-24: 14:00:00 via INTRAVENOUS
  Filled 2018-09-24: qty 250

## 2018-09-24 MED ORDER — DIPHENHYDRAMINE HCL 50 MG/ML IJ SOLN
25.0000 mg | Freq: Once | INTRAMUSCULAR | Status: AC
  Administered 2018-09-24: 25 mg via INTRAVENOUS
  Filled 2018-09-24: qty 1

## 2018-09-24 NOTE — Progress Notes (Signed)
Platelets: 98,000, Sodium: 129. MD, Dr. Rogue Bussing, notified and aware. Per MD order: patient encouraged to drink more fluids containing sodium, such as Gatorade. Patient verbalized understanding. Per MD order: proceed with scheduled treatment today.

## 2018-09-26 ENCOUNTER — Other Ambulatory Visit: Payer: Self-pay | Admitting: Internal Medicine

## 2018-09-26 DIAGNOSIS — C44622 Squamous cell carcinoma of skin of right upper limb, including shoulder: Secondary | ICD-10-CM

## 2018-09-29 DIAGNOSIS — J309 Allergic rhinitis, unspecified: Secondary | ICD-10-CM | POA: Diagnosis not present

## 2018-09-29 DIAGNOSIS — M25512 Pain in left shoulder: Secondary | ICD-10-CM | POA: Diagnosis not present

## 2018-09-29 DIAGNOSIS — G8929 Other chronic pain: Secondary | ICD-10-CM | POA: Diagnosis not present

## 2018-09-29 DIAGNOSIS — S8012XA Contusion of left lower leg, initial encounter: Secondary | ICD-10-CM | POA: Diagnosis not present

## 2018-10-01 ENCOUNTER — Other Ambulatory Visit: Payer: Self-pay

## 2018-10-01 ENCOUNTER — Inpatient Hospital Stay: Payer: PPO

## 2018-10-01 ENCOUNTER — Encounter: Payer: Self-pay | Admitting: Internal Medicine

## 2018-10-01 ENCOUNTER — Inpatient Hospital Stay (HOSPITAL_BASED_OUTPATIENT_CLINIC_OR_DEPARTMENT_OTHER): Payer: PPO | Admitting: Internal Medicine

## 2018-10-01 DIAGNOSIS — Z79899 Other long term (current) drug therapy: Secondary | ICD-10-CM

## 2018-10-01 DIAGNOSIS — Z7982 Long term (current) use of aspirin: Secondary | ICD-10-CM

## 2018-10-01 DIAGNOSIS — C642 Malignant neoplasm of left kidney, except renal pelvis: Secondary | ICD-10-CM

## 2018-10-01 DIAGNOSIS — Z85820 Personal history of malignant melanoma of skin: Secondary | ICD-10-CM

## 2018-10-01 DIAGNOSIS — R531 Weakness: Secondary | ICD-10-CM | POA: Diagnosis not present

## 2018-10-01 DIAGNOSIS — E041 Nontoxic single thyroid nodule: Secondary | ICD-10-CM

## 2018-10-01 DIAGNOSIS — Z7689 Persons encountering health services in other specified circumstances: Secondary | ICD-10-CM

## 2018-10-01 DIAGNOSIS — Z5111 Encounter for antineoplastic chemotherapy: Secondary | ICD-10-CM

## 2018-10-01 DIAGNOSIS — I2510 Atherosclerotic heart disease of native coronary artery without angina pectoris: Secondary | ICD-10-CM | POA: Diagnosis not present

## 2018-10-01 DIAGNOSIS — Z87891 Personal history of nicotine dependence: Secondary | ICD-10-CM

## 2018-10-01 DIAGNOSIS — C44622 Squamous cell carcinoma of skin of right upper limb, including shoulder: Secondary | ICD-10-CM

## 2018-10-01 DIAGNOSIS — C7801 Secondary malignant neoplasm of right lung: Secondary | ICD-10-CM

## 2018-10-01 DIAGNOSIS — C7802 Secondary malignant neoplasm of left lung: Secondary | ICD-10-CM | POA: Diagnosis not present

## 2018-10-01 DIAGNOSIS — Z87442 Personal history of urinary calculi: Secondary | ICD-10-CM

## 2018-10-01 DIAGNOSIS — I10 Essential (primary) hypertension: Secondary | ICD-10-CM

## 2018-10-01 DIAGNOSIS — J449 Chronic obstructive pulmonary disease, unspecified: Secondary | ICD-10-CM

## 2018-10-01 DIAGNOSIS — R5383 Other fatigue: Secondary | ICD-10-CM | POA: Diagnosis not present

## 2018-10-01 LAB — COMPREHENSIVE METABOLIC PANEL
ALK PHOS: 75 U/L (ref 38–126)
ALT: 19 U/L (ref 0–44)
AST: 22 U/L (ref 15–41)
Albumin: 4.2 g/dL (ref 3.5–5.0)
Anion gap: 8 (ref 5–15)
BILIRUBIN TOTAL: 0.4 mg/dL (ref 0.3–1.2)
BUN: 16 mg/dL (ref 8–23)
CALCIUM: 9.3 mg/dL (ref 8.9–10.3)
CO2: 28 mmol/L (ref 22–32)
CREATININE: 1.26 mg/dL — AB (ref 0.61–1.24)
Chloride: 102 mmol/L (ref 98–111)
GFR calc Af Amer: >60 mL/min (ref >60)
GFR, EST NON AFRICAN AMERICAN: 53 mL/min — AB (ref >60)
GLUCOSE: 165 mg/dL — AB (ref 70–99)
POTASSIUM: 4 mmol/L (ref 3.5–5.1)
Sodium: 138 mmol/L (ref 135–145)
TOTAL PROTEIN: 7.4 g/dL (ref 6.5–8.1)

## 2018-10-01 LAB — CBC WITH DIFFERENTIAL/PLATELET
Abs Immature Granulocytes: 0.04 10*3/uL (ref 0.00–0.07)
BASOS PCT: 0 %
Basophils Absolute: 0 10*3/uL (ref 0.0–0.1)
EOS PCT: 1 %
Eosinophils Absolute: 0.1 10*3/uL (ref 0.0–0.5)
HCT: 40.9 % (ref 39.0–52.0)
Hemoglobin: 13.7 g/dL (ref 13.0–17.0)
Immature Granulocytes: 1 %
Lymphocytes Relative: 13 %
Lymphs Abs: 0.9 10*3/uL (ref 0.7–4.0)
MCH: 31.8 pg (ref 26.0–34.0)
MCHC: 33.5 g/dL (ref 30.0–36.0)
MCV: 94.9 fL (ref 80.0–100.0)
MONO ABS: 0.8 10*3/uL (ref 0.1–1.0)
MONOS PCT: 11 %
Neutro Abs: 5.2 10*3/uL (ref 1.7–7.7)
Neutrophils Relative %: 74 %
PLATELETS: 160 10*3/uL (ref 150–400)
RBC: 4.31 MIL/uL (ref 4.22–5.81)
RDW: 12.5 % (ref 11.5–15.5)
WBC: 7 10*3/uL (ref 4.0–10.5)
nRBC: 0 % (ref 0.0–0.2)

## 2018-10-01 MED ORDER — SODIUM CHLORIDE 0.9 % IV SOLN
Freq: Once | INTRAVENOUS | Status: AC
  Administered 2018-10-01: 09:00:00 via INTRAVENOUS
  Filled 2018-10-01: qty 250

## 2018-10-01 MED ORDER — TEMSIROLIMUS CHEMO INJECTION 25 MG/ML W/DILUENT
25.0000 mg | Freq: Once | INTRAVENOUS | Status: AC
  Administered 2018-10-01: 25 mg via INTRAVENOUS
  Filled 2018-10-01: qty 2.5

## 2018-10-01 MED ORDER — PROCHLORPERAZINE MALEATE 10 MG PO TABS
10.0000 mg | ORAL_TABLET | Freq: Once | ORAL | Status: AC
  Administered 2018-10-01: 10 mg via ORAL
  Filled 2018-10-01: qty 1

## 2018-10-01 MED ORDER — DIPHENHYDRAMINE HCL 50 MG/ML IJ SOLN
25.0000 mg | Freq: Once | INTRAMUSCULAR | Status: AC
  Administered 2018-10-01: 25 mg via INTRAVENOUS
  Filled 2018-10-01: qty 1

## 2018-10-01 NOTE — Progress Notes (Signed)
[facility] OFFICE PROGRESS NOTE  Patient Care Team: Dion Body, MD as PCP - General (Family Medicine) Jannet Mantis, MD (Dermatology) Bary Castilla, Forest Gleason, MD (General Surgery) Hollice Espy, MD as Consulting Physician (Urology) Cammie Sickle, MD as Medical Oncologist (Medical Oncology) Isaias Cowman, MD as Consulting Physician (Cardiology) Harriette Bouillon, MD as Referring Physician (Cardiology)  Cancer Staging No matching staging information was found for the patient.   Oncology History   # MAY- June 2017- METASTATIC CLEAR CELL; LEFT RENAL CA/STAGE IV; Furhman- G-3; INTERMEDIATE RISK;  [bil Pul lung nodules;incidental s/p Bx Dr.Byrnett/Dr.Oaks] s/p Cytoreductive nephrectomy; Dr.Brandon- [id number]; July 11th 2017 CT- Enlarging Lung nodules  # Aug 7th 2017- PAZOPANIB; STOP SEP 2017 [pancreatitis/poor tol]  # OCT 1 week- START NIVO q 2W x4; DEC 8th CT- "Progression"; Continue Opdivo;APRIL-MAY 2018- STABLE LUNG NODULES [stopped sec to severe cough; NO Pneumonitis]  # May 31st 2018- Cabo 10m/day; July 31st CT lung- improved lung nodules; Sep 1st week- cabo- 478mday [dose reduced sec to mult intol]; Oct 1st 2week-Cabo 2095may;   # NOV 15th 2018- CT chest- slight progression of lung nodules [poor tol to higer doses];   # Nov 28th 2018- SUTENT 50 mg 2w-on  &1 w-OFF;  # MARCH 2019- Progression; start Axitinib 5 mg BID; July 2019- MIXED response; worsening/new skeletal lesions/stable to improved lung lesions; STOP Axitinib;   # Left hip s/p RT [mid-AUG 2019]  # AUG 12th- RE-START CABO 40 mg/day; STOP SEP 2019- sec to intol [abdominal pain even on 69m47my]; [facility] q W  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];STAGE I  0.42 millimeter depth; s/p WLE [Dr.Byrnett]  ---------------------------------------------------------    # March 2019-  Molecular testing- PDL-1 NEG; TMB-Low; MSI-STABLE; No targets; VHL mutation of  unknown significance  -----------------------------------------------------   # Dx; Kidney cancer/ clear cell STAGE IV  Current treatment: Torisel [oct 23rd ]  Goal: Palliative     Cancer of left kidney excluding renal pelvis (HCC)[place] -  Chemotherapy    The patient had temsirolimus (TORISEL) 25 mg in sodium chloride 0.9 % 250 mL chemo infusion, 25 mg, Intravenous,  Once, 3 of 7 cycles Administration: 25 mg (09/10/2018), 25 mg (09/17/2018), 25 mg (09/24/2018)  for chemotherapy treatment.        INTERVAL HISTORY:  Glen BAILEYy70.  Davis pleasant patient above history of metastatic renal cell cancer currently on Torisel status post cycle #3 is here for follow-up.  Patient denies any abdominal pain.  Denies any nausea vomiting.  Denies any diarrhea.  States his left shoulder pain is improved not resolved.  No skin rash.  Chronic mild fatigue.  No syncopal episodes.  Review of Systems  Constitutional: Positive for malaise/fatigue. Negative for chills, diaphoresis and fever.  HENT: Negative for nosebleeds and sore throat.   Eyes: Negative for double vision.  Respiratory: Negative for cough, hemoptysis, sputum production, shortness of breath and wheezing.   Cardiovascular: Negative for chest pain, palpitations, orthopnea and leg swelling.  Gastrointestinal: Negative for blood in stool, constipation, heartburn, melena and vomiting.  Genitourinary: Negative for dysuria, frequency and urgency.  Musculoskeletal: Positive for joint pain (left shoulder pain).  Skin: Negative.  Negative for itching and rash.  Neurological: Negative for tingling, focal weakness and weakness.  Endo/Heme/Allergies: Does not bruise/bleed easily.  Psychiatric/Behavioral: Negative for depression. The patient is not nervous/anxious and does  not have insomnia.       PAST MEDICAL HISTORY :  Past Medical History:   Diagnosis Date  . CAD (coronary artery disease)   . Cancer ([place])    left arm  . COPD (chronic obstructive pulmonary disease) ([place])   . Hypertension   . Kidney stone   . Lung cancer ([place])   . Melanoma ([place]) 01/24/2016   right shoulder  . Thyroid nodule 02/02/2016   left BENIGN THYROID NODULE by FNA    PAST SURGICAL HISTORY :   Past Surgical History:  Procedure Laterality Date  . arm surgery Left    arm  . cardiac stents  2011   Angioplasty / Stenting Femoral-X2  . COLONOSCOPY  04/01/12  . CORONARY ANGIOPLASTY    . EXCISION MELANOMA WITH SENTINEL LYMPH NODE BIOPSY Right 01/24/2016   Procedure: EXCISION MELANOMA Right Shoulder;  Surgeon: Robert Bellow, MD;  Location: [facility] ORS;  Service: General;  Laterality: Right;  . LAPAROSCOPIC NEPHRECTOMY, HAND ASSISTED Left 04/24/2016   Procedure: HAND ASSISTED LAPAROSCOPIC NEPHRECTOMY;  Surgeon: Hollice Espy, MD;  Location: [facility] ORS;  Service: Urology;  Laterality: Left;  Marland Kitchen VIDEO ASSISTED THORACOSCOPY (VATS)/THOROCOTOMY Left 03/08/2016   Procedure: VIDEO ASSISTED THORACOSCOPY (VATS) with lung biopsy - Left ;  Surgeon: Robert Bellow, MD;  Location: [facility] ORS;  Service: General;  Laterality: Left;    FAMILY HISTORY :   Family History  Problem Relation Age of Onset  . Hodgkin's lymphoma Daughter 5  . Heart attack Father   . Kidney cancer Neg Hx   . Kidney disease Neg Hx   . Prostate cancer Neg Hx     SOCIAL HISTORY:   Social History   Tobacco Use  . Smoking status: Former Smoker    Packs/day: 2.00    Years: 30.00    Pack years: 60.00    Types: Cigarettes    Start date: 01/18/1956    Last attempt to quit: 01/17/1969    Years since quitting: 49.7  . Smokeless tobacco: Never Used  Substance Use Topics  . Alcohol use: No    Alcohol/week: 0.0 standard drinks    Comment: BEER OCC  . Drug use: No    ALLERGIES:  is allergic to lisinopril and other.  MEDICATIONS:  Current Outpatient Medications  Medication Sig Dispense Refill   . acetaminophen (TYLENOL) 500 MG tablet Take 500 mg by mouth every 6 (six) hours as needed for moderate pain.    Marland Kitchen albuterol (PROVENTIL HFA;VENTOLIN HFA) 108 (90 Base) MCG/ACT inhaler Inhale 2 puffs into the lungs every 6 (six) hours as needed for wheezing or shortness of breath. 1 Inhaler 2  . aspirin EC 81 MG tablet Take 81 mg by mouth daily.    Marland Kitchen atorvastatin (LIPITOR) 40 MG tablet Take 40 mg by mouth daily.    . busPIRone (BUSPAR) 5 MG tablet Take 2 tablets by mouth daily.    . cholecalciferol (VITAMIN D) 400 units TABS tablet Take 400 Units by mouth daily.    . clopidogrel (PLAVIX) 75 MG tablet Take 75 mg by mouth daily.    . Coenzyme Q10 (COQ10) 100 MG CAPS Take 1 capsule by mouth daily.    Marland Kitchen dicyclomine (BENTYL) 20 MG tablet Take 1 tablet (20 mg total) by mouth 3 (three) times daily before meals. 60 tablet 0  . diphenoxylate-atropine (LOMOTIL) 2.5-0.025 MG tablet Take 2 tablets by mouth 4 (four) times daily as needed for diarrhea or loose stools. 30 tablet 0  . docusate sodium (  COLACE) 100 MG capsule Take 1 capsule (100 mg total) by mouth 2 (two) times daily. 60 capsule 3  . dronabinol (MARINOL) 2.5 MG capsule Take 1 capsule (2.5 mg total) by mouth 2 (two) times daily before a meal. 60 capsule 1  . DULoxetine (CYMBALTA) 30 MG capsule Take 2 capsules by mouth daily.    . ferrous sulfate 325 (65 FE) MG EC tablet Take 325 mg by mouth 3 (three) times daily with meals.    . fluticasone (FLONASE) 50 MCG/ACT nasal spray Place 2 sprays into both nostrils daily. 16 g 2  . Fluticasone-Salmeterol (ADVAIR DISKUS) 500-50 MCG/DOSE AEPB Inhale 1 puff into the lungs 2 (two) times daily. 60 each 11  . ipratropium-albuterol (DUONEB) 0.5-2.5 (3) MG/3ML SOLN Take 3 mLs by nebulization every 4 (four) hours as needed. 360 mL 3  . loperamide (IMODIUM) 1 MG/5ML solution Take 4 mg by mouth as needed for diarrhea or loose stools.    Marland Kitchen loratadine (CLARITIN) 10 MG tablet Take 10 mg by mouth daily.    Marland Kitchen MELATONIN PO  Take 10 mg by mouth at bedtime.     . Misc Natural Products (GLUCOSAMINE CHOND COMPLEX/MSM) TABS Take 2 tablets by mouth daily.    . montelukast (SINGULAIR) 10 MG tablet TAKE 1 TABLET BY MOUTH EVERYDAY AT BEDTIME 30 tablet 3  . nitroGLYCERIN (NITROSTAT) 0.4 MG SL tablet Place 0.4 mg under the tongue every 5 (five) minutes as needed.     . Omega-3 Fatty Acids (FISH OIL) 1000 MG CPDR Take 1 capsule by mouth daily.    . ondansetron (ZOFRAN) 8 MG tablet Take 1 tablet (8 mg total) by mouth every 8 (eight) hours as needed for nausea or vomiting. 60 tablet 0  . oxyCODONE (OXY IR/ROXICODONE) 5 MG immediate release tablet Take 1 tablet (5 mg total) by mouth every 8 (eight) hours as needed for severe pain. 1 pill every 12 hours as needed for pain 45 tablet 0  . OXYGEN Inhale 2 L into the lungs at bedtime.    . predniSONE (DELTASONE) 10 MG tablet Take 4 tablets by mouth daily.    Marland Kitchen pyridostigmine (MESTINON) 60 MG tablet Take 1 tablet by mouth 2 (two) times daily.    . tamsulosin (FLOMAX) 0.4 MG CAPS capsule TAKE 1 CAPSULE (0.4 MG TOTAL) BY MOUTH EVERY MORNING. 30 capsule 3  . Tiotropium Bromide Monohydrate (SPIRIVA RESPIMAT) 1.25 MCG/ACT AERS Inhale 1.25 Act into the lungs 2 (two) times daily. 1 Inhaler 11  . tiZANidine (ZANAFLEX) 4 MG tablet Take 4 mg by mouth daily.     . Turmeric 500 MG TABS Take 1 tablet by mouth daily.     No current facility-administered medications for this visit.     PHYSICAL EXAMINATION: ECOG PERFORMANCE STATUS: 1 - Symptomatic but completely ambulatory  BP (!) 157/90 (BP Location: Left Arm, Patient Position: Sitting)   Pulse 82   Temp 99.7 F (37.6 C) (Oral)   Resp 16   Ht _0  (1.854 m)   Wt 197 lb (89.4 kg)   BMI 25.99 kg/m   Filed Weights   10/01/18 0833  Weight: 197 lb (89.4 kg)    Physical Exam  Constitutional: He is oriented to person, place, and time and well-developed, well-nourished, and in no distress.  Accompanied by his wife/daughter. He is walking  by himself.  HENT:  Head: Normocephalic and atraumatic.  Mouth/Throat: Oropharynx is clear and moist. No oropharyngeal exudate.  Eyes: Pupils are equal, round, and reactive to  light.  Neck: Normal range of motion. Neck supple.  Cardiovascular: Normal rate and regular rhythm.  Pulmonary/Chest: No respiratory distress. He has no wheezes.  Abdominal: Soft. Bowel sounds are normal. He exhibits no distension and no mass. There is no guarding.  Musculoskeletal: He exhibits no edema or tenderness.  Left shoulder decreased range of motion pain with passive and active movements.  Neurological: He is alert and oriented to person, place, and time.  Skin: Skin is warm.  Psychiatric: Affect normal.       LABORATORY DATA:  I have reviewed the data as listed    Component Value Date/Time   NA 138 10/01/2018 0821   NA 146 (H) 05/25/2016 1514   K 4.0 10/01/2018 0821   CL 102 10/01/2018 0821   CO2 28 10/01/2018 0821   GLUCOSE 165 (H) 10/01/2018 0821   BUN 16 10/01/2018 0821   BUN 19 05/25/2016 1514   CREATININE 1.26 (H) 10/01/2018 0821   CALCIUM 9.3 10/01/2018 0821   PROT 7.4 10/01/2018 0821   ALBUMIN 4.2 10/01/2018 0821   AST 22 10/01/2018 0821   ALT 19 10/01/2018 0821   ALKPHOS 75 10/01/2018 0821   BILITOT 0.4 10/01/2018 0821   GFRNONAA 53 (L) 10/01/2018 0821   GFRAA >60 10/01/2018 0821    No results found for: SPEP, UPEP  Lab Results  Component Value Date   WBC 7.0 10/01/2018   NEUTROABS 5.2 10/01/2018   HGB 13.7 10/01/2018   HCT 40.9 10/01/2018   MCV 94.9 10/01/2018   PLT 160 10/01/2018      Chemistry      Component Value Date/Time   NA 138 10/01/2018 0821   NA 146 (H) 05/25/2016 1514   K 4.0 10/01/2018 0821   CL 102 10/01/2018 0821   CO2 28 10/01/2018 0821   BUN 16 10/01/2018 0821   BUN 19 05/25/2016 1514   CREATININE 1.26 (H) 10/01/2018 0821      Component Value Date/Time   CALCIUM 9.3 10/01/2018 0821   ALKPHOS 75 10/01/2018 0821   AST 22 10/01/2018 0821    ALT 19 10/01/2018 0821   BILITOT 0.4 10/01/2018 0821       RADIOGRAPHIC STUDIES: I have personally reviewed the radiological images as listed and agreed with the findings in the report. No results found.   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  Currently on Torisel.  Stable.  #Proceed with cycle #4 of Torisel today; Labs today reviewed;  acceptable for treatment today. Will plan CT after 6 cycles of treatment.   # Multiple lytic lesions in the bones/left hip-on Xgeva s/p RT-pain improved.  Stable;[ due X-geva 11/20]  # left shoulder pain- ? Mets vs other. X-rays-NEG; improved.   #Labile hypertension-systolic 621H stable.  DISPOSITION:  # Treatment today;  # 1week/labs- cbc/cmp;Torisel/ X-geva [[place]] # 11/26-[place]-labs-cbc/bmp/MD/ torisel-Dr.B   Orders Placed This Encounter  Procedures  . CBC with Differential/Platelet    Standing Status:   Future    Standing Expiration Date:   10/02/2019  . Comprehensive metabolic panel    Standing Status:   Future    Standing Expiration Date:   10/02/2019  . CBC with Differential/Platelet    Standing Status:   Future    Standing Expiration Date:   10/02/2019  . Comprehensive metabolic panel  Standing Status:   Future    Standing Expiration Date:   10/02/2019   All questions were answered. The patient knows to call the clinic with any problems, questions or concerns.      Cammie Sickle, MD 10/01/2018 9:22 AM

## 2018-10-01 NOTE — Assessment & Plan Note (Addendum)
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  Currently on Torisel.  Stable.  #Proceed with cycle #4 of Torisel today; Labs today reviewed;  acceptable for treatment today. Will plan CT after 6 cycles of treatment.   # Multiple lytic lesions in the bones/left hip-on Xgeva s/p RT-pain improved.  Stable;[ due X-geva 11/20]  # left shoulder pain- ? Mets vs other. X-rays-NEG; improved.   #Labile hypertension-systolic 886Y stable.  DISPOSITION:  # Treatment today;  # 1week/labs- cbc/cmp;Torisel/ X-geva [[place]] # 11/26-[place]-labs-cbc/bmp/MD/ torisel-Dr.B

## 2018-10-01 NOTE — Progress Notes (Signed)
Patient here for pre treatment check. No complaints today.

## 2018-10-02 ENCOUNTER — Ambulatory Visit: Payer: PPO | Admitting: General Surgery

## 2018-10-04 ENCOUNTER — Other Ambulatory Visit: Payer: Self-pay | Admitting: Internal Medicine

## 2018-10-07 DIAGNOSIS — G8929 Other chronic pain: Secondary | ICD-10-CM | POA: Diagnosis not present

## 2018-10-07 DIAGNOSIS — M25512 Pain in left shoulder: Secondary | ICD-10-CM | POA: Diagnosis not present

## 2018-10-08 ENCOUNTER — Ambulatory Visit
Admission: RE | Admit: 2018-10-08 | Discharge: 2018-10-08 | Disposition: A | Discharge status: Home / Self Care | Payer: PPO | Source: Ambulatory Visit | Attending: Oncology | Admitting: Oncology

## 2018-10-08 ENCOUNTER — Encounter: Payer: Self-pay | Admitting: Oncology

## 2018-10-08 ENCOUNTER — Inpatient Hospital Stay: Payer: PPO

## 2018-10-08 ENCOUNTER — Ambulatory Visit: Payer: PPO

## 2018-10-08 ENCOUNTER — Inpatient Hospital Stay (HOSPITAL_BASED_OUTPATIENT_CLINIC_OR_DEPARTMENT_OTHER): Payer: PPO | Admitting: Oncology

## 2018-10-08 ENCOUNTER — Other Ambulatory Visit: Payer: Self-pay

## 2018-10-08 VITALS — BP 169/96 | HR 84 | Temp 97.8°F | Resp 20 | Ht 73.0 in | Wt 197.8 lb

## 2018-10-08 VITALS — BP 152/85 | HR 91 | Resp 20

## 2018-10-08 DIAGNOSIS — C7802 Secondary malignant neoplasm of left lung: Secondary | ICD-10-CM | POA: Diagnosis not present

## 2018-10-08 DIAGNOSIS — Z87891 Personal history of nicotine dependence: Secondary | ICD-10-CM

## 2018-10-08 DIAGNOSIS — M546 Pain in thoracic spine: Secondary | ICD-10-CM | POA: Diagnosis not present

## 2018-10-08 DIAGNOSIS — C7951 Secondary malignant neoplasm of bone: Secondary | ICD-10-CM

## 2018-10-08 DIAGNOSIS — G893 Neoplasm related pain (acute) (chronic): Secondary | ICD-10-CM

## 2018-10-08 DIAGNOSIS — R5383 Other fatigue: Secondary | ICD-10-CM

## 2018-10-08 DIAGNOSIS — W19XXXA Unspecified fall, initial encounter: Secondary | ICD-10-CM | POA: Insufficient documentation

## 2018-10-08 DIAGNOSIS — R531 Weakness: Secondary | ICD-10-CM

## 2018-10-08 DIAGNOSIS — S3992XA Unspecified injury of lower back, initial encounter: Secondary | ICD-10-CM | POA: Diagnosis not present

## 2018-10-08 DIAGNOSIS — M19012 Primary osteoarthritis, left shoulder: Secondary | ICD-10-CM | POA: Diagnosis not present

## 2018-10-08 DIAGNOSIS — I2510 Atherosclerotic heart disease of native coronary artery without angina pectoris: Secondary | ICD-10-CM

## 2018-10-08 DIAGNOSIS — Z85820 Personal history of malignant melanoma of skin: Secondary | ICD-10-CM

## 2018-10-08 DIAGNOSIS — C7801 Secondary malignant neoplasm of right lung: Secondary | ICD-10-CM

## 2018-10-08 DIAGNOSIS — R0781 Pleurodynia: Secondary | ICD-10-CM | POA: Diagnosis not present

## 2018-10-08 DIAGNOSIS — Z5111 Encounter for antineoplastic chemotherapy: Secondary | ICD-10-CM

## 2018-10-08 DIAGNOSIS — S299XXA Unspecified injury of thorax, initial encounter: Secondary | ICD-10-CM | POA: Diagnosis not present

## 2018-10-08 DIAGNOSIS — M47815 Spondylosis without myelopathy or radiculopathy, thoracolumbar region: Secondary | ICD-10-CM | POA: Insufficient documentation

## 2018-10-08 DIAGNOSIS — C642 Malignant neoplasm of left kidney, except renal pelvis: Secondary | ICD-10-CM | POA: Diagnosis not present

## 2018-10-08 DIAGNOSIS — S42142A Displaced fracture of glenoid cavity of scapula, left shoulder, initial encounter for closed fracture: Secondary | ICD-10-CM | POA: Insufficient documentation

## 2018-10-08 DIAGNOSIS — Z7982 Long term (current) use of aspirin: Secondary | ICD-10-CM

## 2018-10-08 DIAGNOSIS — Z7689 Persons encountering health services in other specified circumstances: Secondary | ICD-10-CM

## 2018-10-08 DIAGNOSIS — M25512 Pain in left shoulder: Secondary | ICD-10-CM

## 2018-10-08 DIAGNOSIS — Z79899 Other long term (current) drug therapy: Secondary | ICD-10-CM

## 2018-10-08 DIAGNOSIS — E041 Nontoxic single thyroid nodule: Secondary | ICD-10-CM

## 2018-10-08 DIAGNOSIS — J449 Chronic obstructive pulmonary disease, unspecified: Secondary | ICD-10-CM | POA: Diagnosis not present

## 2018-10-08 DIAGNOSIS — Z87442 Personal history of urinary calculi: Secondary | ICD-10-CM

## 2018-10-08 DIAGNOSIS — I10 Essential (primary) hypertension: Secondary | ICD-10-CM

## 2018-10-08 LAB — CBC WITH DIFFERENTIAL/PLATELET
ABS IMMATURE GRANULOCYTES: 0.17 10*3/uL — AB (ref 0.00–0.07)
BASOS ABS: 0 10*3/uL (ref 0.0–0.1)
BASOS PCT: 0 %
EOS ABS: 0 10*3/uL (ref 0.0–0.5)
Eosinophils Relative: 0 %
HCT: 40.7 % (ref 39.0–52.0)
Hemoglobin: 14.1 g/dL (ref 13.0–17.0)
IMMATURE GRANULOCYTES: 1 %
Lymphocytes Relative: 6 %
Lymphs Abs: 0.8 10*3/uL (ref 0.7–4.0)
MCH: 32.9 pg (ref 26.0–34.0)
MCHC: 34.6 g/dL (ref 30.0–36.0)
MCV: 94.9 fL (ref 80.0–100.0)
MONOS PCT: 6 %
Monocytes Absolute: 0.7 10*3/uL (ref 0.1–1.0)
NEUTROS ABS: 10.3 10*3/uL — AB (ref 1.7–7.7)
NEUTROS PCT: 87 %
NRBC: 0.2 % (ref 0.0–0.2)
PLATELETS: 192 10*3/uL (ref 150–400)
RBC: 4.29 MIL/uL (ref 4.22–5.81)
RDW: 12.9 % (ref 11.5–15.5)
WBC: 12 10*3/uL — ABNORMAL HIGH (ref 4.0–10.5)

## 2018-10-08 LAB — COMPREHENSIVE METABOLIC PANEL
ALBUMIN: 3.8 g/dL (ref 3.5–5.0)
ALT: 21 U/L (ref 0–44)
ANION GAP: 13 (ref 5–15)
AST: 22 U/L (ref 15–41)
Alkaline Phosphatase: 83 U/L (ref 38–126)
BILIRUBIN TOTAL: 0.7 mg/dL (ref 0.3–1.2)
BUN: 24 mg/dL — ABNORMAL HIGH (ref 8–23)
CALCIUM: 9.4 mg/dL (ref 8.9–10.3)
CO2: 25 mmol/L (ref 22–32)
Chloride: 97 mmol/L — ABNORMAL LOW (ref 98–111)
Creatinine, Ser: 1.21 mg/dL (ref 0.61–1.24)
GFR calc non Af Amer: 56 mL/min — ABNORMAL LOW (ref >60)
GLUCOSE: 290 mg/dL — AB (ref 70–99)
POTASSIUM: 4.4 mmol/L (ref 3.5–5.1)
SODIUM: 135 mmol/L (ref 135–145)
TOTAL PROTEIN: 6.6 g/dL (ref 6.5–8.1)

## 2018-10-08 MED ORDER — SODIUM CHLORIDE 0.9 % IV SOLN
Freq: Once | INTRAVENOUS | Status: AC
  Administered 2018-10-08: 11:00:00 via INTRAVENOUS
  Filled 2018-10-08: qty 250

## 2018-10-08 MED ORDER — DENOSUMAB 120 MG/1.7ML ~~LOC~~ SOLN
120.0000 mg | Freq: Once | SUBCUTANEOUS | Status: AC
  Administered 2018-10-08: 120 mg via SUBCUTANEOUS
  Filled 2018-10-08: qty 1.7

## 2018-10-08 MED ORDER — PROCHLORPERAZINE MALEATE 10 MG PO TABS
10.0000 mg | ORAL_TABLET | Freq: Once | ORAL | Status: AC
  Administered 2018-10-08: 10 mg via ORAL
  Filled 2018-10-08: qty 1

## 2018-10-08 MED ORDER — OXYCODONE HCL 5 MG PO TABS: 10.0000 mg | ORAL_TABLET | Freq: Three times a day (TID) | ORAL | 0 refills | Status: DC | PRN

## 2018-10-08 MED ORDER — DIPHENHYDRAMINE HCL 50 MG/ML IJ SOLN
25.0000 mg | Freq: Once | INTRAMUSCULAR | Status: AC
  Administered 2018-10-08: 25 mg via INTRAVENOUS
  Filled 2018-10-08: qty 1

## 2018-10-08 MED ORDER — MORPHINE SULFATE 2 MG/ML IJ SOLN
2.0000 mg | Freq: Once | INTRAMUSCULAR | Status: AC
  Administered 2018-10-08: 2 mg via INTRAVENOUS
  Filled 2018-10-08: qty 1

## 2018-10-08 MED ORDER — TEMSIROLIMUS CHEMO INJECTION 25 MG/ML W/DILUENT
25.0000 mg | Freq: Once | INTRAVENOUS | Status: AC
  Administered 2018-10-08: 25 mg via INTRAVENOUS
  Filled 2018-10-08: qty 2.5

## 2018-10-08 NOTE — Progress Notes (Signed)
Patient here for chemotherapy today. Pt's wife asked to speak to Nira Conn, Rn to report a recent fall by patient and to see if the patient could be evaluated by NP today. [place] and tripped over an object in the floor. He did not tell the store that he fell. He refused to go to the ER for evaluation per pt's wife. Patient admits to falling on the left shoulder. He denies hitting his head when falling. Patient reports pain in left shoulder and upper arm and left chest wall. Patient unable to grip ahold of RN's fingers in left hand without pain. Patient reports chronic sinus congestion as well.

## 2018-10-08 NOTE — Progress Notes (Signed)
Symptom Management Consult note [facility]  Telephone:([phone] [phone]  Patient Care Team: Glen Body, MD as PCP - General (Family Medicine) Glen Mantis, MD (Dermatology) Glen Davis, Glen Gleason, MD (General Surgery) Glen Espy, MD as Consulting Physician (Urology) Glen Sickle, MD as Medical Oncologist (Medical Oncology) Glen Cowman, MD as Consulting Physician (Cardiology) Glen Bouillon, MD as Referring Physician (Cardiology)   Name of the patient: Glen Davis  [id number]  30-Jul-1941   Date of visit: 10/08/2018  Diagnosis: 1. Cancer, metastatic to bone [facility]) - DG Chest 2 View; Future - DG Lumbar Spine Complete; Future - DG Thoracic Spine 4V; Future - DG Shoulder Left; Future   Chief Complaint: mechanical fall yesterday  Current Treatment: s/p 4 cycles Torisel.  Last given on 10/01/2018.  Scheduled for cycle 5 today.  Oncology History: Patient last seen by primary oncologist Dr. Rogue Davis on 10/01/2018 for follow-up.  At that time he denied any abdominal pain, nausea or vomiting and diarrhea.  Stated he continued to have left shoulder pain that was improving but not resolved.  Complained of mild fatigue. Denied syncopal episodes.  X-ray of left shoulder was recommended revealing no acute abnormalities.   In the interim , he was seen by his PCP Dr. Netty Davis on 10/07/18 for chronic left shoulder pain refractory to conservative measures.  Received steroid injection with improvement.  Had mechanical fall on 10/08/18 while shopping at [facility] with his wife. States, " His left foot got hung on boxes" resulting in a fall landing on his left shoulder.  He denies syncope or trauma to his head.  Admitted to left rib pain was intermittent and mild in severity.  Oncology History   # MAY- June 2017- METASTATIC CLEAR CELL; LEFT RENAL CA/STAGE IV; Furhman- G-3; INTERMEDIATE RISK;  [bil Pul lung  nodules;incidental s/p Bx Glen Davis] s/p Cytoreductive nephrectomy; GlenBrandon- [id number]; July 11th 2017 CT- Enlarging Lung nodules  # Aug 7th 2017- PAZOPANIB; STOP SEP 2017 [pancreatitis/poor tol]  # OCT 1 week- START NIVO q 2W x4; DEC 8th CT- "Progression"; Continue Opdivo;APRIL-MAY 2018- STABLE LUNG NODULES [stopped sec to severe cough; NO Pneumonitis]  # May 31st 2018- Cabo 50m/day; July 31st CT lung- improved lung nodules; Sep 1st week- cabo- 417mday [dose reduced sec to mult intol]; Oct 1st 2week-Cabo 2036may;   # NOV 15th 2018- CT chest- slight progression of lung nodules [poor tol to higer doses];   # Nov 28th 2018- SUTENT 50 mg 2w-on  &1 w-OFF;  # MARCH 2019- Progression; start Axitinib 5 mg BID; July 2019- MIXED response; worsening/new skeletal lesions/stable to improved lung lesions; STOP Axitinib;   # Left hip s/p RT [mid-AUG 2019]  # AUG 12th- RE-START CABO 40 mg/day; STOP SEP 2019- sec to intol [abdominal pain even on 55m3my]; [facility] q W  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];STAGE I  0.42 millimeter depth; s/p WLE [GlenByrnett]  ---------------------------------------------------------    # March 2019- Molecular testing- PDL-1 NEG; TMB-Low; MSI-STABLE; No targets; VHL mutation of  unknown significance  -----------------------------------------------------   # Dx; Kidney cancer/ clear cell STAGE IV  Current treatment: Torisel [oct 23rd ]  Goal: Palliative     Cancer of left kidney excluding renal pelvis (HCC)[place] -  Chemotherapy    The patient had temsirolimus (TORISEL) 25 mg in sodium chloride 0.9 % 250 mL chemo infusion, 25 mg,  Intravenous,  Once, 4 of 7 cycles Administration: 25 mg (09/10/2018), 25 mg (09/17/2018), 25 mg (09/24/2018), 25 mg (10/01/2018)  for chemotherapy treatment.      Subjective Data:  Subjective:    Glen Davis  is a 77 y.o. male who presents with left shoulder and rib pain. The symptoms began 2 days ago. Aggravating factors: fall while shopping at [employer]. Pain is located around the acromioclavicular Glen Davis) joint. Discomfort is described as sharp/stabbing. Symptoms are exacerbated by repetitive movements. Evaluation to date and thearpy to date: Had x-ray of left shoulder on 09/17/18 d/t persistent pain revealing mild to moderate inferior Glen Davis upper.  Otherwise mild degenerative changes but normal appearing bone and soft tissue.  Encouraged conservative management with rest, OTC analgesics and opiates if needed.  He was seen by Dr. Netty Davis on 10/07/2018 for continued chronic left shoulder pain refractory to conservative management and received a kenalog/lidcaine/marcaine into posterior lateral subacromial joint space.  Noted some improvement prior to fall.   The following portions of the patient's history were reviewed and updated as appropriate: allergies, current medications, past family history, past medical history, past social history, past surgical history and problem list.  Review of Systems A comprehensive review of systems was negative except for: Constitutional: positive for fatigue Respiratory: positive for sinus congestion with occasional coughing Cardiovascular: positive for chest pressure/discomfort, fatigue and near-syncope Musculoskeletal: positive for back pain and muscle weakness Neurological: positive for coordination problems, dizziness, gait problems and weakness   Objective:    BP (!) 169/96 Comment: standing bp 138/78  Pulse 84   Temp 97.8 F (36.6 C) (Tympanic)   Resp 20   Ht '6\' 1"'  (1.854 m)   Wt 197 lb 12.8 oz (89.7 kg)   BMI 26.10 kg/m  Right shoulder: normal active ROM, no tenderness, no impingement sign  Left shoulder: tenderness over the Glen Davis joint     Assessment:    Left shoulder pain  d/t mechanical fall  Plan:    Neurosurgeon  distributed. Rest, ice, compression, and elevation (RICE) therapy. Plain film x-rays. Orthopedics referral.    Left kidney cancer with mets to the lung: S/p several lines of treatment. Discontinued d/t progression or intolerable side effects. He s currently on Torisel IV weekly. Due for cycle 4 today.  Plan is for reimaging after 6 cycles of treatment.  Tolerating well.  Proceed with treatment today.  Slight elevation in white count.  We will continue to monitor but okay for treatment today.   Pain; known bony disease in spine and left shoulder: Unfortunately, current pain medication regimen is not controlling pain.  Spoke with Dr. Rogue Davis and will adjust pain medication.  Dr. Rogue Davis does not recommend long-acting pain medication given his history of orthostasis.  Will adjust his oxycodone to 5 mg every 8 hours to 10 mg every 8 hours for pain.  Prescription sent.  Mechanical fall with injury: Injury to left shoulder.  No obvious deformity during assessment.  Range of motion is limited.  Recently had steroid injection with some improvement prior to his fall yesterday.   Given his significant pain to left shoulder, ribs and history of metastatic bony disease and lumbar and thoracic spine, will get stat imaging to rule out fracture.  Plan: Stat chest x-ray. Results: Negative for acute infection.  Stat left rib x-ray. Results: Acute, closed, intra-articular fracture of the left glenoid with slight distraction along the medial aspect of the fracture. No acute displaced rib fracture Stat lumbar and thoracic  spine x-ray. Results: Negative for fracture.  Stat left shoulder x-ray. Intra-articular fracture of the upper third of the glenoid with slight distraction along the medial aspect of the fracture fragment  Will place stat referral for orthopedic surgery to evaluate further.  Patient was able to be seen by [facility] this afternoon for evaluation.   Greater than 50% was spent in  counseling and coordination of care with this patient including but not limited to discussion of the relevant topics above (See A&P) including, but not limited to diagnosis and management of acute and chronic medical conditions.   Faythe Casa, NP 10/10/2018 10:26 AM

## 2018-10-14 ENCOUNTER — Inpatient Hospital Stay (HOSPITAL_BASED_OUTPATIENT_CLINIC_OR_DEPARTMENT_OTHER): Payer: PPO | Admitting: Internal Medicine

## 2018-10-14 ENCOUNTER — Other Ambulatory Visit: Payer: Self-pay | Admitting: Student

## 2018-10-14 ENCOUNTER — Encounter: Payer: Self-pay | Admitting: *Deleted

## 2018-10-14 ENCOUNTER — Inpatient Hospital Stay: Payer: PPO

## 2018-10-14 VITALS — BP 160/93 | HR 89 | Temp 98.9°F | Resp 22 | Ht 73.0 in | Wt 193.8 lb

## 2018-10-14 DIAGNOSIS — R0781 Pleurodynia: Secondary | ICD-10-CM

## 2018-10-14 DIAGNOSIS — Z7689 Persons encountering health services in other specified circumstances: Secondary | ICD-10-CM

## 2018-10-14 DIAGNOSIS — J449 Chronic obstructive pulmonary disease, unspecified: Secondary | ICD-10-CM

## 2018-10-14 DIAGNOSIS — C7951 Secondary malignant neoplasm of bone: Secondary | ICD-10-CM | POA: Diagnosis not present

## 2018-10-14 DIAGNOSIS — Z87442 Personal history of urinary calculi: Secondary | ICD-10-CM

## 2018-10-14 DIAGNOSIS — R5383 Other fatigue: Secondary | ICD-10-CM | POA: Diagnosis not present

## 2018-10-14 DIAGNOSIS — Z79899 Other long term (current) drug therapy: Secondary | ICD-10-CM

## 2018-10-14 DIAGNOSIS — C7801 Secondary malignant neoplasm of right lung: Secondary | ICD-10-CM

## 2018-10-14 DIAGNOSIS — Z87891 Personal history of nicotine dependence: Secondary | ICD-10-CM

## 2018-10-14 DIAGNOSIS — I2510 Atherosclerotic heart disease of native coronary artery without angina pectoris: Secondary | ICD-10-CM

## 2018-10-14 DIAGNOSIS — R531 Weakness: Secondary | ICD-10-CM | POA: Diagnosis not present

## 2018-10-14 DIAGNOSIS — I10 Essential (primary) hypertension: Secondary | ICD-10-CM

## 2018-10-14 DIAGNOSIS — Z85820 Personal history of malignant melanoma of skin: Secondary | ICD-10-CM

## 2018-10-14 DIAGNOSIS — C642 Malignant neoplasm of left kidney, except renal pelvis: Secondary | ICD-10-CM

## 2018-10-14 DIAGNOSIS — S4292XA Fracture of left shoulder girdle, part unspecified, initial encounter for closed fracture: Secondary | ICD-10-CM

## 2018-10-14 DIAGNOSIS — Z5111 Encounter for antineoplastic chemotherapy: Secondary | ICD-10-CM | POA: Diagnosis not present

## 2018-10-14 DIAGNOSIS — M84412A Pathological fracture, left shoulder, initial encounter for fracture: Secondary | ICD-10-CM | POA: Diagnosis not present

## 2018-10-14 DIAGNOSIS — Z7982 Long term (current) use of aspirin: Secondary | ICD-10-CM

## 2018-10-14 DIAGNOSIS — R739 Hyperglycemia, unspecified: Secondary | ICD-10-CM | POA: Diagnosis not present

## 2018-10-14 DIAGNOSIS — E041 Nontoxic single thyroid nodule: Secondary | ICD-10-CM

## 2018-10-14 DIAGNOSIS — C7802 Secondary malignant neoplasm of left lung: Secondary | ICD-10-CM | POA: Diagnosis not present

## 2018-10-14 LAB — COMPREHENSIVE METABOLIC PANEL
ALBUMIN: 3.6 g/dL (ref 3.5–5.0)
ALK PHOS: 78 U/L (ref 38–126)
ALT: 30 U/L (ref 0–44)
ANION GAP: 10 (ref 5–15)
AST: 27 U/L (ref 15–41)
BILIRUBIN TOTAL: 0.4 mg/dL (ref 0.3–1.2)
BUN: 23 mg/dL (ref 8–23)
CALCIUM: 9 mg/dL (ref 8.9–10.3)
CO2: 26 mmol/L (ref 22–32)
Chloride: 94 mmol/L — ABNORMAL LOW (ref 98–111)
Creatinine, Ser: 1.21 mg/dL (ref 0.61–1.24)
GFR, EST NON AFRICAN AMERICAN: 57 mL/min — AB (ref >60)
Glucose, Bld: 232 mg/dL — ABNORMAL HIGH (ref 70–99)
Potassium: 4.2 mmol/L (ref 3.5–5.1)
Sodium: 130 mmol/L — ABNORMAL LOW (ref 135–145)
TOTAL PROTEIN: 7.3 g/dL (ref 6.5–8.1)

## 2018-10-14 LAB — CBC WITH DIFFERENTIAL/PLATELET
Abs Immature Granulocytes: 0.06 10*3/uL (ref 0.00–0.07)
BASOS PCT: 0 %
Basophils Absolute: 0 10*3/uL (ref 0.0–0.1)
EOS ABS: 0.1 10*3/uL (ref 0.0–0.5)
Eosinophils Relative: 1 %
HCT: 40.3 % (ref 39.0–52.0)
Hemoglobin: 13.8 g/dL (ref 13.0–17.0)
IMMATURE GRANULOCYTES: 1 %
Lymphocytes Relative: 9 %
Lymphs Abs: 0.9 10*3/uL (ref 0.7–4.0)
MCH: 32.3 pg (ref 26.0–34.0)
MCHC: 34.2 g/dL (ref 30.0–36.0)
MCV: 94.4 fL (ref 80.0–100.0)
MONO ABS: 0.8 10*3/uL (ref 0.1–1.0)
MONOS PCT: 9 %
Neutro Abs: 7.4 10*3/uL (ref 1.7–7.7)
Neutrophils Relative %: 80 %
PLATELETS: 138 10*3/uL — AB (ref 150–400)
RBC: 4.27 MIL/uL (ref 4.22–5.81)
RDW: 13.7 % (ref 11.5–15.5)
WBC: 9.2 10*3/uL (ref 4.0–10.5)
nRBC: 0 % (ref 0.0–0.2)

## 2018-10-14 MED ORDER — HYDROMORPHONE HCL 2 MG PO TABS: 2.0000 mg | ORAL_TABLET | Freq: Four times a day (QID) | ORAL | 0 refills | Status: DC | PRN

## 2018-10-14 NOTE — Progress Notes (Addendum)
Per pt's wife [facility] has not contacted the patient with an apt with the orthopedic surgery. I contacted [facility]geons office in [place]. Per office, referral from [facility] was just sent yesterday. Still in review in the surgeon's office-Dr. Marcelino Scot. I expressed the urgency of this referral and advocated to get an apt with Dr. Lennette Bihari Haddix today at 1:30 pm.  Treatment will be post-pone today per Dr. B due to orthopedic apts. Pt unable to r/s chemo tom. due to travels for Thanksgiving holiday.

## 2018-10-14 NOTE — Assessment & Plan Note (Addendum)
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  Currently on Torisel.  Stable.  # Currently status post 4 cycles of torisel.  Will postpone the next treatment to a week from now given his travel plans/ given Ortho appointment later in the afternoon today.  #Elevated blood sugars-postprandial anywhere between 230-290.  Likely due to Torisel.  Monitor closely.  If continues to be elevated recommend metformin.  # Left shoulder fracture-status post fall awaiting ortho referral in [place].  Not controlled on oxycodone 10 mg.  Recommend Dilaudid 2 mg every 6 hours as needed.  New prescription sent.  # Labile hypertension-systolic-160s.  Stable.  DISPOSITION:  # No Treatment today # 1week/labs- cbc/cmp;Torisel/ X-geva [[place]]

## 2018-10-14 NOTE — Progress Notes (Signed)
[facility] OFFICE PROGRESS NOTE  Patient Care Team: Dion Body, MD as PCP - General (Family Medicine) Jannet Mantis, MD (Dermatology) Bary Castilla, Forest Gleason, MD (General Surgery) Hollice Espy, MD as Consulting Physician (Urology) Cammie Sickle, MD as Medical Oncologist (Medical Oncology) Isaias Cowman, MD as Consulting Physician (Cardiology) Harriette Bouillon, MD as Referring Physician (Cardiology)  Cancer Staging No matching staging information was found for the patient.   Oncology History   # MAY- June 2017- METASTATIC CLEAR CELL; LEFT RENAL CA/STAGE IV; [place]- G-3; INTERMEDIATE RISK;  [bil Pul lung nodules;incidental s/p Bx Dr.Byrnett/Dr.Oaks] s/p Cytoreductive nephrectomy; Dr.Brandon- [id number]; July 11th 2017 CT- Enlarging Lung nodules  # Aug 7th 2017- PAZOPANIB; STOP SEP 2017 [pancreatitis/poor tol]  # OCT 1 week- START NIVO q 2W x4; DEC 8th CT- "Progression"; Continue Opdivo;APRIL-MAY 2018- STABLE LUNG NODULES [stopped sec to severe cough; NO Pneumonitis]  # May 31st 2018- Cabo 31m/day; July 31st CT lung- improved lung nodules; Sep 1st week- cabo- 428mday [dose reduced sec to mult intol]; Oct 1st 2week-Cabo 2040may;   # NOV 15th 2018- CT chest- slight progression of lung nodules [poor tol to higer doses];   # Nov 28th 2018- SUTENT 50 mg 2w-on  &1 w-OFF;  # MARCH 2019- Progression; start Axitinib 5 mg BID; July 2019- MIXED response; worsening/new skeletal lesions/stable to improved lung lesions; STOP Axitinib;   # Left hip s/p RT [mid-AUG 2019]  # AUG 12th- RE-START CABO 40 mg/day; STOP SEP 2019- sec to intol [abdominal pain even on 58m44my]; [facility] q W  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];STAGE I  0.42 millimeter depth; s/p WLE [Dr.Byrnett]  ---------------------------------------------------------    # March 2019-  Molecular testing- PDL-1 NEG; TMB-Low; MSI-STABLE; No targets; VHL mutation of  unknown significance  -----------------------------------------------------   # Dx; Kidney cancer/ clear cell STAGE IV  Current treatment: Torisel [oct 23rd ]  Goal: Palliative     Cancer of left kidney excluding renal pelvis (HCC)[place] -  Chemotherapy    The patient had temsirolimus (TORISEL) 25 mg in sodium chloride 0.9 % 250 mL chemo infusion, 25 mg, Intravenous,  Once, 5 of 7 cycles Administration: 25 mg (09/10/2018), 25 mg (09/17/2018), 25 mg (09/24/2018), 25 mg (10/08/2018), 25 mg (10/01/2018)  for chemotherapy treatment.        INTERVAL HISTORY:  [facility] [id number].  male pleasant patient above history of metastatic renal cell cancer currently on Torisel status post cycle #4 is here for follow-up.  Patient had a recent fall of unclear etiology.  Patient fractured his left shoulder; he was evaluated by orthopedics.  [place].  Patient denies abdominal pain denies any nausea vomiting or diarrhea.  No skin rash.  He continues to complain of significant pain in his left shoulder especially with movement.  He is in a sling.  He states his oxycodone 10 every 8 hours is not helping his pain.  He is in significant distress from the pain/difficulty sleeping.  Review of Systems  Constitutional: Positive for malaise/fatigue. Negative for chills, diaphoresis and fever.  HENT: Negative for nosebleeds and sore throat.   Eyes: Negative for double vision.  Respiratory: Negative for cough, hemoptysis, sputum production, shortness of breath and wheezing.   Cardiovascular: Negative for chest pain, palpitations, orthopnea and leg swelling.  Gastrointestinal: Negative for blood in stool, constipation, heartburn, melena and vomiting.  Genitourinary: Negative for dysuria, frequency and urgency.  Musculoskeletal: Positive for  joint pain (left shoulder pain).  Skin: Negative.  Negative for itching and rash.  Neurological: Negative for tingling, focal weakness and weakness.  Endo/Heme/Allergies: Does not bruise/bleed easily.  Psychiatric/Behavioral: Negative for depression. The patient has insomnia. The patient is not nervous/anxious.       PAST MEDICAL HISTORY :  Past Medical History:  Diagnosis Date  . CAD (coronary artery disease)   . Cancer ([place])    left arm  . COPD (chronic obstructive pulmonary disease) ([place])   . Hypertension   . Kidney stone   . Lung cancer ([place])   . Melanoma ([place]) 01/24/2016   right shoulder  . Thyroid nodule 02/02/2016   left BENIGN THYROID NODULE by FNA    PAST SURGICAL HISTORY :   Past Surgical History:  Procedure Laterality Date  . arm surgery Left    arm  . cardiac stents  2011   Angioplasty / Stenting Femoral-X2  . COLONOSCOPY  04/01/12  . CORONARY ANGIOPLASTY    . EXCISION MELANOMA WITH SENTINEL LYMPH NODE BIOPSY Right 01/24/2016   Procedure: EXCISION MELANOMA Right Shoulder;  Surgeon: Robert Bellow, MD;  Location: [facility] ORS;  Service: General;  Laterality: Right;  . LAPAROSCOPIC NEPHRECTOMY, HAND ASSISTED Left 04/24/2016   Procedure: HAND ASSISTED LAPAROSCOPIC NEPHRECTOMY;  Surgeon: Hollice Espy, MD;  Location: [facility] ORS;  Service: Urology;  Laterality: Left;  Marland Kitchen VIDEO ASSISTED THORACOSCOPY (VATS)/THOROCOTOMY Left 03/08/2016   Procedure: VIDEO ASSISTED THORACOSCOPY (VATS) with lung biopsy - Left ;  Surgeon: Robert Bellow, MD;  Location: [facility] ORS;  Service: General;  Laterality: Left;    FAMILY HISTORY :   Family History  Problem Relation Age of Onset  . Hodgkin's lymphoma Daughter 5  . Heart attack Father   . Kidney cancer Neg Hx   . Kidney disease Neg Hx   . Prostate cancer Neg Hx     SOCIAL HISTORY:   Social History   Tobacco Use  . Smoking status: Former Smoker    Packs/day: 2.00    Years: 30.00    Pack years: 60.00    Types: Cigarettes     Start date: 01/18/1956    Last attempt to quit: 01/17/1969    Years since quitting: 49.7  . Smokeless tobacco: Never Used  Substance Use Topics  . Alcohol use: No    Alcohol/week: 0.0 standard drinks    Comment: BEER OCC  . Drug use: No    ALLERGIES:  is allergic to lisinopril and other.  MEDICATIONS:  Current Outpatient Medications  Medication Sig Dispense Refill  . acetaminophen (TYLENOL) 500 MG tablet Take 500 mg by mouth every 6 (six) hours as needed for moderate pain.    Marland Kitchen albuterol (PROVENTIL HFA;VENTOLIN HFA) 108 (90 Base) MCG/ACT inhaler Inhale 2 puffs into the lungs every 6 (six) hours as needed for wheezing or shortness of breath. 1 Inhaler 2  . aspirin EC 81 MG tablet Take 81 mg by mouth daily.    Marland Kitchen atorvastatin (LIPITOR) 40 MG tablet Take 40 mg by mouth daily.    . cholecalciferol (VITAMIN D) 400 units TABS tablet Take 400 Units by mouth daily.    . clopidogrel (PLAVIX) 75 MG tablet Take 75 mg by mouth daily.    . Coenzyme Q10 (COQ10) 100 MG CAPS Take 1 capsule by mouth daily.    Marland Kitchen dicyclomine (BENTYL) 20 MG  tablet Take 1 tablet (20 mg total) by mouth 3 (three) times daily before meals. 60 tablet 0  . docusate sodium (COLACE) 100 MG capsule Take 1 capsule (100 mg total) by mouth 2 (two) times daily. 60 capsule 3  . dronabinol (MARINOL) 2.5 MG capsule Take 1 capsule (2.5 mg total) by mouth 2 (two) times daily before a meal. 60 capsule 1  . DULoxetine (CYMBALTA) 30 MG capsule Take 2 capsules by mouth daily.    . fluticasone (FLONASE) 50 MCG/ACT nasal spray Place 2 sprays into both nostrils daily. 16 g 2  . Fluticasone-Salmeterol (ADVAIR DISKUS) 500-50 MCG/DOSE AEPB Inhale 1 puff into the lungs 2 (two) times daily. 60 each 11  . ipratropium-albuterol (DUONEB) 0.5-2.5 (3) MG/3ML SOLN Take 3 mLs by nebulization every 4 (four) hours as needed. 360 mL 3  . loratadine (CLARITIN) 10 MG tablet Take 10 mg by mouth daily.    Marland Kitchen MELATONIN PO Take 10 mg by mouth at bedtime.     . Misc  Natural Products (GLUCOSAMINE CHOND COMPLEX/MSM) TABS Take 2 tablets by mouth daily.    . montelukast (SINGULAIR) 10 MG tablet TAKE 1 TABLET BY MOUTH EVERYDAY AT BEDTIME 90 tablet 1  . Omega-3 Fatty Acids (FISH OIL) 1000 MG CPDR Take 1 capsule by mouth daily.    Marland Kitchen oxyCODONE (OXY IR/ROXICODONE) 5 MG immediate release tablet Take 2 tablets (10 mg total) by mouth every 8 (eight) hours as needed for severe pain. 1 pill every 12 hours as needed for pain 45 tablet 0  . pyridostigmine (MESTINON) 60 MG tablet Take 1 tablet by mouth 2 (two) times daily.    . tamsulosin (FLOMAX) 0.4 MG CAPS capsule TAKE 1 CAPSULE (0.4 MG TOTAL) BY MOUTH EVERY MORNING. 30 capsule 3  . Tiotropium Bromide Monohydrate (SPIRIVA RESPIMAT) 1.25 MCG/ACT AERS Inhale 1.25 Act into the lungs 2 (two) times daily. 1 Inhaler 11  . tiZANidine (ZANAFLEX) 4 MG tablet Take 4 mg by mouth daily.     . Turmeric 500 MG TABS Take 1 tablet by mouth daily.    . diphenoxylate-atropine (LOMOTIL) 2.5-0.025 MG tablet Take 2 tablets by mouth 4 (four) times daily as needed for diarrhea or loose stools. (Patient not taking: Reported on 10/14/2018) 30 tablet 0  . ferrous sulfate 325 (65 FE) MG EC tablet Take 325 mg by mouth 3 (three) times daily with meals.    Marland Kitchen HYDROmorphone (DILAUDID) 2 MG tablet Take 1 tablet (2 mg total) by mouth every 6 (six) hours as needed for severe pain. 45 tablet 0  . loperamide (IMODIUM) 1 MG/5ML solution Take 4 mg by mouth as needed for diarrhea or loose stools.    . nitroGLYCERIN (NITROSTAT) 0.4 MG SL tablet Place 0.4 mg under the tongue every 5 (five) minutes as needed.     . ondansetron (ZOFRAN) 8 MG tablet Take 1 tablet (8 mg total) by mouth every 8 (eight) hours as needed for nausea or vomiting. (Patient not taking: Reported on 10/14/2018) 60 tablet 0  . OXYGEN Inhale 2 L into the lungs at bedtime.     No current facility-administered medications for this visit.     PHYSICAL EXAMINATION: ECOG PERFORMANCE STATUS: 1 -  Symptomatic but completely ambulatory  BP (!) 160/93 (BP Location: Right Arm, Patient Position: Sitting)   Pulse 89   Temp 98.9 F (37.2 C) (Oral)   Resp (!) 22   Ht '6\' 1"'  (1.854 m)   Wt 193 lb 12.6 oz (87.9 kg)  BMI 25.57 kg/m   Filed Weights   10/14/18 1031  Weight: 193 lb 12.6 oz (87.9 kg)    Physical Exam  Constitutional: He is oriented to person, place, and time and well-developed, well-nourished, and in no distress.  Accompanied by his wife/daughter. He is walking by himself.  HENT:  Head: Normocephalic and atraumatic.  Mouth/Throat: Oropharynx is clear and moist. No oropharyngeal exudate.  Eyes: Pupils are equal, round, and reactive to light.  Neck: Normal range of motion. Neck supple.  Cardiovascular: Normal rate and regular rhythm.  Pulmonary/Chest: No respiratory distress. He has no wheezes.  Abdominal: Soft. Bowel sounds are normal. He exhibits no distension and no mass. There is no guarding.  Musculoskeletal: He exhibits no edema or tenderness.  Left shoulder decreased range of motion pain with passive and active movements.  Neurological: He is alert and oriented to person, place, and time.  Skin: Skin is warm.  Psychiatric: Affect normal.       LABORATORY DATA:  I have reviewed the data as listed    Component Value Date/Time   NA 130 (L) 10/14/2018 1017   NA 146 (H) 05/25/2016 1514   K 4.2 10/14/2018 1017   CL 94 (L) 10/14/2018 1017   CO2 26 10/14/2018 1017   GLUCOSE 232 (H) 10/14/2018 1017   BUN 23 10/14/2018 1017   BUN 19 05/25/2016 1514   CREATININE 1.21 10/14/2018 1017   CALCIUM 9.0 10/14/2018 1017   PROT 7.3 10/14/2018 1017   ALBUMIN 3.6 10/14/2018 1017   AST 27 10/14/2018 1017   ALT 30 10/14/2018 1017   ALKPHOS 78 10/14/2018 1017   BILITOT 0.4 10/14/2018 1017   GFRNONAA 57 (L) 10/14/2018 1017   GFRAA >60 10/14/2018 1017    No results found for: SPEP, UPEP  Lab Results  Component Value Date   WBC 9.2 10/14/2018   NEUTROABS 7.4  10/14/2018   HGB 13.8 10/14/2018   HCT 40.3 10/14/2018   MCV 94.4 10/14/2018   PLT 138 (L) 10/14/2018      Chemistry      Component Value Date/Time   NA 130 (L) 10/14/2018 1017   NA 146 (H) 05/25/2016 1514   K 4.2 10/14/2018 1017   CL 94 (L) 10/14/2018 1017   CO2 26 10/14/2018 1017   BUN 23 10/14/2018 1017   BUN 19 05/25/2016 1514   CREATININE 1.21 10/14/2018 1017      Component Value Date/Time   CALCIUM 9.0 10/14/2018 1017   ALKPHOS 78 10/14/2018 1017   AST 27 10/14/2018 1017   ALT 30 10/14/2018 1017   BILITOT 0.4 10/14/2018 1017       RADIOGRAPHIC STUDIES: I have personally reviewed the radiological images as listed and agreed with the findings in the report. No results found.   ASSESSMENT & PLAN:  Cancer of left kidney excluding renal pelvis [facility])  # Left kidney cancer metastatic to the lung- June 2019 CT scan shows stable to improved lung lesions; however multiple lytic lesions in the bones.- MIXED RESPONSE.  Currently on Torisel.  Stable.  # Currently status post 4 cycles of torisel.  Will postpone the next treatment to a week from now given his travel plans/ given Ortho appointment later in the afternoon today.  #Elevated blood sugars-postprandial anywhere between 230-290.  Likely due to Torisel.  Monitor closely.  If continues to be elevated recommend metformin.  # Left shoulder fracture-status post fall awaiting ortho referral in [place].  Not controlled on oxycodone 10 mg.  Recommend  Dilaudid 2 mg every 6 hours as needed.  New prescription sent.  # Labile hypertension-systolic-160s.  Stable.  DISPOSITION:  # No Treatment today # 1week/labs- cbc/cmp;Torisel/ X-geva [[place]]    No orders of the defined types were placed in this encounter.  All questions were answered. The patient knows to call the clinic with any problems, questions or concerns.      Cammie Sickle, MD 10/14/2018 11:28 AM

## 2018-10-15 ENCOUNTER — Ambulatory Visit: Payer: PPO

## 2018-10-19 DIAGNOSIS — J449 Chronic obstructive pulmonary disease, unspecified: Secondary | ICD-10-CM | POA: Diagnosis not present

## 2018-10-19 DIAGNOSIS — C642 Malignant neoplasm of left kidney, except renal pelvis: Secondary | ICD-10-CM | POA: Diagnosis not present

## 2018-10-19 DIAGNOSIS — R0602 Shortness of breath: Secondary | ICD-10-CM | POA: Diagnosis not present

## 2018-10-19 DIAGNOSIS — R05 Cough: Secondary | ICD-10-CM | POA: Diagnosis not present

## 2018-10-20 ENCOUNTER — Inpatient Hospital Stay: Payer: PPO

## 2018-10-20 ENCOUNTER — Encounter: Payer: Self-pay | Admitting: Internal Medicine

## 2018-10-20 ENCOUNTER — Other Ambulatory Visit: Payer: Self-pay

## 2018-10-20 ENCOUNTER — Ambulatory Visit
Admission: RE | Admit: 2018-10-20 | Discharge: 2018-10-20 | Disposition: A | Discharge status: Home / Self Care | Payer: PPO | Source: Ambulatory Visit | Attending: Student | Admitting: Student

## 2018-10-20 ENCOUNTER — Telehealth: Payer: Self-pay | Admitting: Internal Medicine

## 2018-10-20 ENCOUNTER — Other Ambulatory Visit: Payer: Self-pay | Admitting: *Deleted

## 2018-10-20 ENCOUNTER — Inpatient Hospital Stay (HOSPITAL_BASED_OUTPATIENT_CLINIC_OR_DEPARTMENT_OTHER): Payer: PPO | Admitting: Internal Medicine

## 2018-10-20 ENCOUNTER — Inpatient Hospital Stay: Payer: PPO | Attending: Internal Medicine

## 2018-10-20 DIAGNOSIS — J449 Chronic obstructive pulmonary disease, unspecified: Secondary | ICD-10-CM | POA: Insufficient documentation

## 2018-10-20 DIAGNOSIS — Z87442 Personal history of urinary calculi: Secondary | ICD-10-CM | POA: Insufficient documentation

## 2018-10-20 DIAGNOSIS — M899 Disorder of bone, unspecified: Secondary | ICD-10-CM | POA: Diagnosis not present

## 2018-10-20 DIAGNOSIS — Z7982 Long term (current) use of aspirin: Secondary | ICD-10-CM

## 2018-10-20 DIAGNOSIS — E079 Disorder of thyroid, unspecified: Secondary | ICD-10-CM | POA: Diagnosis not present

## 2018-10-20 DIAGNOSIS — I10 Essential (primary) hypertension: Secondary | ICD-10-CM | POA: Diagnosis not present

## 2018-10-20 DIAGNOSIS — R5383 Other fatigue: Secondary | ICD-10-CM | POA: Insufficient documentation

## 2018-10-20 DIAGNOSIS — R918 Other nonspecific abnormal finding of lung field: Secondary | ICD-10-CM | POA: Diagnosis not present

## 2018-10-20 DIAGNOSIS — C7800 Secondary malignant neoplasm of unspecified lung: Secondary | ICD-10-CM | POA: Insufficient documentation

## 2018-10-20 DIAGNOSIS — Z5111 Encounter for antineoplastic chemotherapy: Secondary | ICD-10-CM | POA: Diagnosis not present

## 2018-10-20 DIAGNOSIS — S4992XA Unspecified injury of left shoulder and upper arm, initial encounter: Secondary | ICD-10-CM | POA: Diagnosis not present

## 2018-10-20 DIAGNOSIS — C642 Malignant neoplasm of left kidney, except renal pelvis: Secondary | ICD-10-CM | POA: Insufficient documentation

## 2018-10-20 DIAGNOSIS — Z79899 Other long term (current) drug therapy: Secondary | ICD-10-CM | POA: Insufficient documentation

## 2018-10-20 DIAGNOSIS — R739 Hyperglycemia, unspecified: Secondary | ICD-10-CM | POA: Diagnosis not present

## 2018-10-20 DIAGNOSIS — C7951 Secondary malignant neoplasm of bone: Secondary | ICD-10-CM

## 2018-10-20 DIAGNOSIS — M84412A Pathological fracture, left shoulder, initial encounter for fracture: Secondary | ICD-10-CM | POA: Diagnosis not present

## 2018-10-20 DIAGNOSIS — Z85820 Personal history of malignant melanoma of skin: Secondary | ICD-10-CM | POA: Insufficient documentation

## 2018-10-20 DIAGNOSIS — M85512 Aneurysmal bone cyst, left shoulder: Secondary | ICD-10-CM

## 2018-10-20 DIAGNOSIS — M25512 Pain in left shoulder: Secondary | ICD-10-CM | POA: Diagnosis not present

## 2018-10-20 DIAGNOSIS — I2510 Atherosclerotic heart disease of native coronary artery without angina pectoris: Secondary | ICD-10-CM

## 2018-10-20 DIAGNOSIS — Z87891 Personal history of nicotine dependence: Secondary | ICD-10-CM | POA: Diagnosis not present

## 2018-10-20 LAB — CBC WITH DIFFERENTIAL/PLATELET
Abs Immature Granulocytes: 0.04 10*3/uL (ref 0.00–0.07)
Basophils Absolute: 0 10*3/uL (ref 0.0–0.1)
Basophils Relative: 0 %
EOS PCT: 1 %
Eosinophils Absolute: 0.1 10*3/uL (ref 0.0–0.5)
HEMATOCRIT: 38.2 % — AB (ref 39.0–52.0)
HEMOGLOBIN: 12.9 g/dL — AB (ref 13.0–17.0)
Immature Granulocytes: 1 %
LYMPHS ABS: 0.9 10*3/uL (ref 0.7–4.0)
LYMPHS PCT: 12 %
MCH: 31.9 pg (ref 26.0–34.0)
MCHC: 33.8 g/dL (ref 30.0–36.0)
MCV: 94.6 fL (ref 80.0–100.0)
Monocytes Absolute: 0.7 10*3/uL (ref 0.1–1.0)
Monocytes Relative: 9 %
Neutro Abs: 6.1 10*3/uL (ref 1.7–7.7)
Neutrophils Relative %: 77 %
Platelets: 178 10*3/uL (ref 150–400)
RBC: 4.04 MIL/uL — ABNORMAL LOW (ref 4.22–5.81)
RDW: 14.1 % (ref 11.5–15.5)
WBC: 7.9 10*3/uL (ref 4.0–10.5)
nRBC: 0 % (ref 0.0–0.2)

## 2018-10-20 LAB — BASIC METABOLIC PANEL
Anion gap: 7 (ref 5–15)
BUN: 15 mg/dL (ref 8–23)
CHLORIDE: 100 mmol/L (ref 98–111)
CO2: 27 mmol/L (ref 22–32)
CREATININE: 1.09 mg/dL (ref 0.61–1.24)
Calcium: 9 mg/dL (ref 8.9–10.3)
GFR calc Af Amer: >60 mL/min (ref >60)
GFR calc non Af Amer: >60 mL/min (ref >60)
GLUCOSE: 134 mg/dL — AB (ref 70–99)
Potassium: 4 mmol/L (ref 3.5–5.1)
Sodium: 134 mmol/L — ABNORMAL LOW (ref 135–145)

## 2018-10-20 MED ORDER — PROCHLORPERAZINE MALEATE 5 MG PO TABS
10.0000 mg | ORAL_TABLET | Freq: Once | ORAL | Status: AC
  Administered 2018-10-20: 10 mg via ORAL
  Filled 2018-10-20: qty 2

## 2018-10-20 MED ORDER — FENTANYL 25 MCG/HR TD PT72: 25.0000 ug | MEDICATED_PATCH | TRANSDERMAL | 0 refills | Status: DC

## 2018-10-20 MED ORDER — METFORMIN HCL 500 MG PO TABS: 500.0000 mg | ORAL_TABLET | Freq: Two times a day (BID) | ORAL | 3 refills | Status: DC

## 2018-10-20 MED ORDER — HYDROMORPHONE HCL 2 MG PO TABS: 2.0000 mg | ORAL_TABLET | Freq: Four times a day (QID) | ORAL | 0 refills | Status: DC | PRN

## 2018-10-20 MED ORDER — DIPHENHYDRAMINE HCL 50 MG/ML IJ SOLN
25.0000 mg | Freq: Once | INTRAMUSCULAR | Status: AC
  Administered 2018-10-20: 25 mg via INTRAVENOUS
  Filled 2018-10-20: qty 1

## 2018-10-20 MED ORDER — TEMSIROLIMUS CHEMO INJECTION 25 MG/ML W/DILUENT
25.0000 mg | Freq: Once | INTRAVENOUS | Status: AC
  Administered 2018-10-20: 25 mg via INTRAVENOUS
  Filled 2018-10-20: qty 2.5

## 2018-10-20 MED ORDER — SODIUM CHLORIDE 0.9 % IV SOLN
Freq: Once | INTRAVENOUS | Status: AC
  Administered 2018-10-20: 13:00:00 via INTRAVENOUS
  Filled 2018-10-20: qty 250

## 2018-10-20 NOTE — Progress Notes (Signed)
[facility] PROGRESS NOTE  Patient Care Team: Dion Body, MD as PCP - General (Family Medicine) Jannet Mantis, MD (Dermatology) Bary Castilla, Forest Gleason, MD (General Surgery) Hollice Espy, MD as Consulting Physician (Urology) Cammie Sickle, MD as Medical Oncologist (Medical Oncology) Isaias Cowman, MD as Consulting Physician (Cardiology) Harriette Bouillon, MD as Referring Physician (Cardiology)  Cancer Staging No matching staging information was found for the patient.   Oncology History   # MAY- June 2017- METASTATIC CLEAR CELL; LEFT RENAL CA/STAGE IV; [place]- G-3; INTERMEDIATE RISK;  [bil Pul lung nodules;incidental s/p Bx Dr.Byrnett/Dr.Oaks] s/p Cytoreductive nephrectomy; Dr.Brandon- [id number]; July 11th 2017 CT- Enlarging Lung nodules  # Aug 7th 2017- PAZOPANIB; STOP SEP 2017 [pancreatitis/poor tol]  # OCT 1 week- START NIVO q 2W x4; DEC 8th CT- "Progression"; Continue Opdivo;APRIL-MAY 2018- STABLE LUNG NODULES [stopped sec to severe cough; NO Pneumonitis]  # May 31st 2018- Cabo 42m/day; July 31st CT lung- improved lung nodules; Sep 1st week- cabo- 449mday [dose reduced sec to mult intol]; Oct 1st 2week-Cabo 2046may;   # NOV 15th 2018- CT chest- slight progression of lung nodules [poor tol to higer doses];   # Nov 28th 2018- SUTENT 50 mg 2w-on  &1 w-OFF;  # MARCH 2019- Progression; start Axitinib 5 mg BID; July 2019- MIXED response; worsening/new skeletal lesions/stable to improved lung lesions; STOP Axitinib;   # Left hip s/p RT [mid-AUG 2019]  # AUG 12th- RE-START CABO 40 mg/day; STOP SEP 2019- sec to intol [abdominal pain even on 24m86my]; [facility] q W  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];STAGE I  0.42 millimeter depth; s/p WLE [Dr.Byrnett]  ---------------------------------------------------------    # March 2019-  Molecular testing- PDL-1 NEG; TMB-Low; MSI-STABLE; No targets; VHL mutation of  unknown significance  -----------------------------------------------------   # Dx; Kidney cancer/ clear cell STAGE IV  Current treatment: Torisel [oct 23rd ]  Goal: Palliative     Cancer of left kidney excluding renal pelvis (HCC)Ramos10/22/2019 -  Chemotherapy    The patient had temsirolimus (TORISEL) 25 mg in sodium chloride 0.9 % 250 mL chemo infusion, 25 mg, Intravenous,  Once, 6 of 9 cycles Administration: 25 mg (09/10/2018), 25 mg (09/17/2018), 25 mg (09/24/2018), 25 mg (10/08/2018), 25 mg (10/01/2018)  for chemotherapy treatment.        INTERVAL HISTORY:  Glen KAMPEy1.  male pleasant patient above history of metastatic renal cell cancer currently on Torisel status post cycle #4 is here for follow-up.  Patient was evaluated by orthopedics last week.  He is awaiting to have a shoulder CT scan today afternoon.  Patient continues to have significant pain of his left shoulder especially with movement.  Is currently taking Dilaudid 2 mg every 6 hours as needed.  Not well controlled.  Denies any further dizzy spells or nausea vomiting diarrhea.  He otherwise had a good Thanksgiving.  [place]  Review of Systems  Constitutional: Positive for malaise/fatigue. Negative for chills, diaphoresis and fever.  HENT: Negative for nosebleeds and sore throat.   Eyes: Negative for double vision.  Respiratory: Negative for cough, hemoptysis, sputum production, shortness of breath and wheezing.   Cardiovascular: Negative for chest pain, palpitations, orthopnea and leg swelling.  Gastrointestinal: Negative for blood in stool, constipation, heartburn, melena and vomiting.  Genitourinary: Negative for dysuria, frequency and urgency.  Musculoskeletal: Positive for joint pain (left shoulder pain).  Skin: Negative.  Negative for itching and rash.  Neurological:  Negative for tingling, focal weakness and weakness.  Endo/Heme/Allergies: Does not bruise/bleed easily.  Psychiatric/Behavioral: Negative for depression. The patient has insomnia. The patient is not nervous/anxious.       PAST MEDICAL HISTORY :  Past Medical History:  Diagnosis Date  . CAD (coronary artery disease)   . Cancer ([place])    left arm  . COPD (chronic obstructive pulmonary disease) ([place])   . Hypertension   . Kidney stone   . Lung cancer ([place])   . Melanoma ([place]) 01/24/2016   right shoulder  . Thyroid nodule 02/02/2016   left BENIGN THYROID NODULE by FNA    PAST SURGICAL HISTORY :   Past Surgical History:  Procedure Laterality Date  . arm surgery Left    arm  . cardiac stents  2011   Angioplasty / Stenting Femoral-X2  . COLONOSCOPY  04/01/12  . CORONARY ANGIOPLASTY    . EXCISION MELANOMA WITH SENTINEL LYMPH NODE BIOPSY Right 01/24/2016   Procedure: EXCISION MELANOMA Right Shoulder;  Surgeon: Robert Bellow, MD;  Location: [facility] ORS;  Service: General;  Laterality: Right;  . LAPAROSCOPIC NEPHRECTOMY, HAND ASSISTED Left 04/24/2016   Procedure: HAND ASSISTED LAPAROSCOPIC NEPHRECTOMY;  Surgeon: Hollice Espy, MD;  Location: [facility] ORS;  Service: Urology;  Laterality: Left;  Marland Kitchen VIDEO ASSISTED THORACOSCOPY (VATS)/THOROCOTOMY Left 03/08/2016   Procedure: VIDEO ASSISTED THORACOSCOPY (VATS) with lung biopsy - Left ;  Surgeon: Robert Bellow, MD;  Location: [facility] ORS;  Service: General;  Laterality: Left;    FAMILY HISTORY :   Family History  Problem Relation Age of Onset  . Hodgkin's lymphoma Daughter 5  . Heart attack Father   . Kidney cancer Neg Hx   . Kidney disease Neg Hx   . Prostate cancer Neg Hx     SOCIAL HISTORY:   Social History   Tobacco Use  . Smoking status: Former Smoker    Packs/day: 2.00    Years: 30.00    Pack years: 60.00    Types: Cigarettes    Start date: 01/18/1956    Last attempt to quit: 01/17/1969    Years since quitting: 49.7  . Smokeless  tobacco: Never Used  Substance Use Topics  . Alcohol use: No    Alcohol/week: 0.0 standard drinks    Comment: BEER OCC  . Drug use: No    ALLERGIES:  is allergic to lisinopril and other.  MEDICATIONS:  Current Outpatient Medications  Medication Sig Dispense Refill  . acetaminophen (TYLENOL) 500 MG tablet Take 500 mg by mouth every 6 (six) hours as needed for moderate pain.    Marland Kitchen albuterol (PROVENTIL HFA;VENTOLIN HFA) 108 (90 Base) MCG/ACT inhaler Inhale 2 puffs into the lungs every 6 (six) hours as needed for wheezing or shortness of breath. 1 Inhaler 2  . aspirin EC 81 MG tablet Take 81 mg by mouth daily.    Marland Kitchen atorvastatin (LIPITOR) 40 MG tablet Take 40 mg by mouth daily.    . cholecalciferol (VITAMIN D) 400 units TABS tablet Take 400 Units by mouth daily.    . clopidogrel (PLAVIX) 75 MG tablet Take 75 mg by mouth daily.    . Coenzyme Q10 (COQ10) 100 MG CAPS Take 1 capsule by mouth daily.    Marland Kitchen dicyclomine (BENTYL) 20 MG tablet Take 1 tablet (20 mg total) by mouth 3 (three) times daily before meals. 60 tablet 0  .  docusate sodium (COLACE) 100 MG capsule Take 1 capsule (100 mg total) by mouth 2 (two) times daily. 60 capsule 3  . dronabinol (MARINOL) 2.5 MG capsule Take 1 capsule (2.5 mg total) by mouth 2 (two) times daily before a meal. 60 capsule 1  . DULoxetine (CYMBALTA) 30 MG capsule Take 2 capsules by mouth daily.    . fluticasone (FLONASE) 50 MCG/ACT nasal spray Place 2 sprays into both nostrils daily. 16 g 2  . Fluticasone-Salmeterol (ADVAIR DISKUS) 500-50 MCG/DOSE AEPB Inhale 1 puff into the lungs 2 (two) times daily. 60 each 11  . [START ON 10/23/2018] HYDROmorphone (DILAUDID) 2 MG tablet Take 1 tablet (2 mg total) by mouth every 6 (six) hours as needed for severe pain. 60 tablet 0  . loratadine (CLARITIN) 10 MG tablet Take 10 mg by mouth daily.    Marland Kitchen MELATONIN PO Take 10 mg by mouth at bedtime.     . Misc Natural Products (GLUCOSAMINE CHOND COMPLEX/MSM) TABS Take 2 tablets by mouth  daily.    . nitroGLYCERIN (NITROSTAT) 0.4 MG SL tablet Place 0.4 mg under the tongue every 5 (five) minutes as needed.     . Omega-3 Fatty Acids (FISH OIL) 1000 MG CPDR Take 1 capsule by mouth daily.    . OXYGEN Inhale 2 L into the lungs at bedtime.    . tamsulosin (FLOMAX) 0.4 MG CAPS capsule TAKE 1 CAPSULE (0.4 MG TOTAL) BY MOUTH EVERY MORNING. 30 capsule 3  . Tiotropium Bromide Monohydrate (SPIRIVA RESPIMAT) 1.25 MCG/ACT AERS Inhale 1.25 Act into the lungs 2 (two) times daily. 1 Inhaler 11  . tiZANidine (ZANAFLEX) 4 MG tablet Take 4 mg by mouth daily.     . Turmeric 500 MG TABS Take 1 tablet by mouth daily.    . diphenoxylate-atropine (LOMOTIL) 2.5-0.025 MG tablet Take 2 tablets by mouth 4 (four) times daily as needed for diarrhea or loose stools. (Patient not taking: Reported on 10/14/2018) 30 tablet 0  . fentaNYL (DURAGESIC - DOSED MCG/HR) 25 MCG/HR patch Place 1 patch (25 mcg total) onto the skin every 3 (three) days. 10 patch 0  . ferrous sulfate 325 (65 FE) MG EC tablet Take 325 mg by mouth 3 (three) times daily with meals.    Marland Kitchen ipratropium-albuterol (DUONEB) 0.5-2.5 (3) MG/3ML SOLN Take 3 mLs by nebulization every 4 (four) hours as needed. (Patient not taking: Reported on 10/20/2018) 360 mL 3  . loperamide (IMODIUM) 1 MG/5ML solution Take 4 mg by mouth as needed for diarrhea or loose stools.    . metFORMIN (GLUCOPHAGE) 500 MG tablet Take 1 tablet (500 mg total) by mouth 2 (two) times daily with a meal. 60 tablet 3  . montelukast (SINGULAIR) 10 MG tablet TAKE 1 TABLET BY MOUTH EVERYDAY AT BEDTIME (Patient not taking: Reported on 10/20/2018) 90 tablet 1  . ondansetron (ZOFRAN) 8 MG tablet Take 1 tablet (8 mg total) by mouth every 8 (eight) hours as needed for nausea or vomiting. (Patient not taking: Reported on 10/14/2018) 60 tablet 0   No current facility-administered medications for this visit.     PHYSICAL EXAMINATION: ECOG PERFORMANCE STATUS: 1 - Symptomatic but completely  ambulatory  BP (!) 184/83 (Patient Position: Sitting)   Pulse 85   Temp 98.3 F (36.8 C) (Oral)   Resp 20   Ht '6\' 1"'  (1.854 m)   Wt 192 lb (87.1 kg)   BMI 25.33 kg/m   Filed Weights   10/20/18 1136  Weight: 192 lb (87.1 kg)  Physical Exam  Constitutional: He is oriented to person, place, and time and well-developed, well-nourished, and in no distress.  Accompanied by his wife/daughter. He is walking by himself.  HENT:  Head: Normocephalic and atraumatic.  Mouth/Throat: Oropharynx is clear and moist. No oropharyngeal exudate.  Eyes: Pupils are equal, round, and reactive to light.  Neck: Normal range of motion. Neck supple.  Cardiovascular: Normal rate and regular rhythm.  Pulmonary/Chest: No respiratory distress. He has no wheezes.  Abdominal: Soft. Bowel sounds are normal. He exhibits no distension and no mass. There is no guarding.  Musculoskeletal: He exhibits no edema or tenderness.  Left shoulder decreased range of motion pain with passive and active movements.  Neurological: He is alert and oriented to person, place, and time.  Skin: Skin is warm.  Psychiatric: Affect normal.       LABORATORY DATA:  I have reviewed the data as listed    Component Value Date/Time   NA 134 (L) 10/20/2018 1107   NA 146 (H) 05/25/2016 1514   K 4.0 10/20/2018 1107   CL 100 10/20/2018 1107   CO2 27 10/20/2018 1107   GLUCOSE 134 (H) 10/20/2018 1107   BUN 15 10/20/2018 1107   BUN 19 05/25/2016 1514   CREATININE 1.09 10/20/2018 1107   CALCIUM 9.0 10/20/2018 1107   PROT 7.3 10/14/2018 1017   ALBUMIN 3.6 10/14/2018 1017   AST 27 10/14/2018 1017   ALT 30 10/14/2018 1017   ALKPHOS 78 10/14/2018 1017   BILITOT 0.4 10/14/2018 1017   GFRNONAA >60 10/20/2018 1107   GFRAA >60 10/20/2018 1107    No results found for: SPEP, UPEP  Lab Results  Component Value Date   WBC 7.9 10/20/2018   NEUTROABS 6.1 10/20/2018   HGB 12.9 (L) 10/20/2018   HCT 38.2 (L) 10/20/2018   MCV 94.6  10/20/2018   PLT 178 10/20/2018      Chemistry      Component Value Date/Time   NA 134 (L) 10/20/2018 1107   NA 146 (H) 05/25/2016 1514   K 4.0 10/20/2018 1107   CL 100 10/20/2018 1107   CO2 27 10/20/2018 1107   BUN 15 10/20/2018 1107   BUN 19 05/25/2016 1514   CREATININE 1.09 10/20/2018 1107      Component Value Date/Time   CALCIUM 9.0 10/20/2018 1107   ALKPHOS 78 10/14/2018 1017   AST 27 10/14/2018 1017   ALT 30 10/14/2018 1017   BILITOT 0.4 10/14/2018 1017       RADIOGRAPHIC STUDIES: I have personally reviewed the radiological images as listed and agreed with the findings in the report. No results found.   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 Currently status post 4 cycles of torisel. STABLE.  #  Proceed with #5 treatment today; Labs today reviewed;  acceptable for treatment today.   Discussed that we will plan to get imaging after 2 to 3 months of treatment.  #Elevated blood sugars-postprandial 232.  Secondary to Carticel.  Recommend starting metformin 500 BID.   # Left shoulder fracture-? Path fracture vs traumatic; status post orthopedic evaluation.  Awaiting shoulder CT scan today;  Not controlled; add fenatnyl patch;  cont Dilaudid 2 mg every 6 hours as needed.  New prescription sent.  Radiation oncology evaluation tomorrow.  # Labile hypertension-systolic-160s.  Stable.  DISPOSITION:  # Treatment today- Torisel/X-geva  # 1 week- cbc/bmp-Torisel # follow up in 2 weeks- MD/labs- cbc/cmp;Torisel- Dr.B    Orders Placed This Encounter  Procedures  . CBC with Differential/Platelet    Standing Status:   Future    Standing Expiration Date:   10/21/2019  . Comprehensive metabolic panel    Standing Status:   Future    Standing Expiration Date:   10/21/2019   All  questions were answered. The patient knows to call the clinic with any problems, questions or concerns.      Cammie Sickle, MD 10/20/2018 1:06 PM

## 2018-10-20 NOTE — Telephone Encounter (Signed)
#   I forgot.. #  please also schedule in 1 week- cbc/bmp-Torisel- Thanks GB

## 2018-10-20 NOTE — Addendum Note (Signed)
Addended by: Sandria Bales B on: 10/20/2018 02:32 PM   Modules accepted: Orders

## 2018-10-20 NOTE — Assessment & Plan Note (Addendum)
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 Currently status post 4 cycles of torisel. STABLE.  #  Proceed with #5 treatment today; Labs today reviewed;  acceptable for treatment today.   Discussed that we will plan to get imaging after 2 to 3 months of treatment.  #Elevated blood sugars-postprandial 232.  Secondary to Carticel.  Recommend starting metformin 500 BID.   # Left shoulder fracture-? Path fracture vs traumatic; status post orthopedic evaluation.  Awaiting shoulder CT scan today;  Not controlled; add fenatnyl patch;  cont Dilaudid 2 mg every 6 hours as needed.  New prescription sent.  Radiation oncology evaluation tomorrow.  # Labile hypertension-systolic-160s.  Stable.  DISPOSITION:  # Treatment today- Torisel/X-geva  # 1 week- cbc/bmp-Torisel # follow up in 2 weeks- MD/labs- cbc/cmp;Torisel- Dr.B

## 2018-10-21 ENCOUNTER — Other Ambulatory Visit: Payer: Self-pay

## 2018-10-21 ENCOUNTER — Encounter: Payer: Self-pay | Admitting: Radiation Oncology

## 2018-10-21 ENCOUNTER — Ambulatory Visit
Admission: RE | Admit: 2018-10-21 | Discharge: 2018-10-21 | Disposition: A | Discharge status: Home / Self Care | Payer: PPO | Source: Ambulatory Visit | Attending: Radiation Oncology | Admitting: Radiation Oncology

## 2018-10-21 VITALS — BP 128/88 | HR 94 | Temp 97.2°F | Resp 16 | Ht 73.0 in | Wt 193.1 lb

## 2018-10-21 DIAGNOSIS — C7951 Secondary malignant neoplasm of bone: Secondary | ICD-10-CM | POA: Diagnosis not present

## 2018-10-21 DIAGNOSIS — C649 Malignant neoplasm of unspecified kidney, except renal pelvis: Secondary | ICD-10-CM | POA: Insufficient documentation

## 2018-10-21 DIAGNOSIS — Z923 Personal history of irradiation: Secondary | ICD-10-CM | POA: Diagnosis not present

## 2018-10-21 NOTE — Progress Notes (Signed)
Radiation Oncology Follow up Note old patient new area left shoulder metastasis  Name: Glen Davis   Date:   10/21/2018 MRN:  [id number] DOB: 01/30/1941    This 77 y.o. male presents to the clinic today for evaluation of his left shoulder for stage IV metastatic renal cell carcinoma.  REFERRING PROVIDER: Dion Body, MD  HPI: patient is a 77 year old male well-known to department having received palliative radiation therapy to his L-spine as well as his left iliac wing for stage IV renal cell carcinoma. He's recently complaining of increasing left shoulder pain..CT scan showed a large lytic lesion in the left glenoid and coracoid process with associated pathologic fracture. He is currently on narcotic analgesics which seems to be helping the pain. He does have some limitation of motion in his left upper extremity. I been asked to evaluate him for possible palliative radiation therapy.patient has had excellent palliative benefit in his iliac wing and lumbar spine from prior palliative radiation.  COMPLICATIONS OF TREATMENT: none  FOLLOW UP COMPLIANCE: keeps appointments   PHYSICAL EXAM:  BP 128/88 (BP Location: Right Arm, Patient Position: Sitting)   Pulse 94   Temp (!) 97.2 F (36.2 C) (Tympanic)   Resp 16   Ht 6\' 1"  (1.854 m)   Wt 193 lb 2 oz (87.6 kg)   BMI 25.48 kg/m  There is limited range of motion in the left upper extremity.no focal neurologic deficits are appreciated.Well-developed well-nourished patient in NAD. HEENT reveals PERLA, EOMI, discs not visualized.  Oral cavity is clear. No oral mucosal lesions are identified. Neck is clear without evidence of cervical or supraclavicular adenopathy. Lungs are clear to A&P. Cardiac examination is essentially unremarkable with regular rate and rhythm without murmur rub or thrill. Abdomen is benign with no organomegaly or masses noted. Motor sensory and DTR levels are equal and symmetric in the upper and lower extremities. Cranial  nerves II through XII are grossly intact. Proprioception is intact. No peripheral adenopathy or edema is identified. No motor or sensory levels are noted. Crude visual fields are within normal range.  RADIOLOGY RESULTS: CT scans are reviewed and compatible above-stated findings  PLAN: present time like to go ahead with palliative radiation therapy to his left shoulder. Would plan on delivering 3000 cGy in 10 fractions. Risks and benefits of treatment including skin reaction fatigue and possible alteration of blood counts all were discussed in detail with the patient. I have personally set up and ordered CT simulation for later this week. Patient copy in my treatment plan well.  I would like to take this opportunity to thank you for allowing me to participate in the care of your patient.Noreene Filbert, MD

## 2018-10-23 ENCOUNTER — Ambulatory Visit
Admission: RE | Admit: 2018-10-23 | Discharge: 2018-10-23 | Disposition: A | Discharge status: Home / Self Care | Payer: PPO | Source: Ambulatory Visit | Attending: Radiation Oncology | Admitting: Radiation Oncology

## 2018-10-23 DIAGNOSIS — Z510 Encounter for antineoplastic radiation therapy: Secondary | ICD-10-CM | POA: Insufficient documentation

## 2018-10-23 DIAGNOSIS — C7951 Secondary malignant neoplasm of bone: Secondary | ICD-10-CM | POA: Diagnosis not present

## 2018-10-23 DIAGNOSIS — C649 Malignant neoplasm of unspecified kidney, except renal pelvis: Secondary | ICD-10-CM | POA: Insufficient documentation

## 2018-10-23 DIAGNOSIS — C642 Malignant neoplasm of left kidney, except renal pelvis: Secondary | ICD-10-CM | POA: Diagnosis not present

## 2018-10-24 DIAGNOSIS — C7951 Secondary malignant neoplasm of bone: Secondary | ICD-10-CM | POA: Diagnosis not present

## 2018-10-24 DIAGNOSIS — Z510 Encounter for antineoplastic radiation therapy: Secondary | ICD-10-CM | POA: Diagnosis not present

## 2018-10-24 DIAGNOSIS — C642 Malignant neoplasm of left kidney, except renal pelvis: Secondary | ICD-10-CM | POA: Diagnosis not present

## 2018-10-27 ENCOUNTER — Ambulatory Visit: Payer: PPO | Admitting: Internal Medicine

## 2018-10-27 ENCOUNTER — Inpatient Hospital Stay: Payer: PPO

## 2018-10-27 ENCOUNTER — Other Ambulatory Visit: Payer: Self-pay | Admitting: Internal Medicine

## 2018-10-27 ENCOUNTER — Other Ambulatory Visit: Payer: PPO

## 2018-10-27 ENCOUNTER — Ambulatory Visit: Payer: PPO

## 2018-10-27 VITALS — BP 124/83 | HR 85 | Temp 97.3°F | Resp 18

## 2018-10-27 DIAGNOSIS — C642 Malignant neoplasm of left kidney, except renal pelvis: Secondary | ICD-10-CM

## 2018-10-27 DIAGNOSIS — C801 Malignant (primary) neoplasm, unspecified: Secondary | ICD-10-CM

## 2018-10-27 DIAGNOSIS — Z5111 Encounter for antineoplastic chemotherapy: Secondary | ICD-10-CM | POA: Diagnosis not present

## 2018-10-27 LAB — CBC WITH DIFFERENTIAL/PLATELET
Abs Immature Granulocytes: 0.01 10*3/uL (ref 0.00–0.07)
BASOS ABS: 0 10*3/uL (ref 0.0–0.1)
Basophils Relative: 0 %
Eosinophils Absolute: 0.1 10*3/uL (ref 0.0–0.5)
Eosinophils Relative: 3 %
HCT: 37.7 % — ABNORMAL LOW (ref 39.0–52.0)
HEMOGLOBIN: 12.6 g/dL — AB (ref 13.0–17.0)
Immature Granulocytes: 0 %
LYMPHS PCT: 13 %
Lymphs Abs: 0.7 10*3/uL (ref 0.7–4.0)
MCH: 31.7 pg (ref 26.0–34.0)
MCHC: 33.4 g/dL (ref 30.0–36.0)
MCV: 95 fL (ref 80.0–100.0)
Monocytes Absolute: 0.7 10*3/uL (ref 0.1–1.0)
Monocytes Relative: 13 %
NEUTROS PCT: 71 %
NRBC: 0 % (ref 0.0–0.2)
Neutro Abs: 3.9 10*3/uL (ref 1.7–7.7)
Platelets: 172 10*3/uL (ref 150–400)
RBC: 3.97 MIL/uL — AB (ref 4.22–5.81)
RDW: 14.3 % (ref 11.5–15.5)
WBC: 5.4 10*3/uL (ref 4.0–10.5)

## 2018-10-27 LAB — COMPREHENSIVE METABOLIC PANEL
ALBUMIN: 3.5 g/dL (ref 3.5–5.0)
ALK PHOS: 72 U/L (ref 38–126)
ALT: 18 U/L (ref 0–44)
ANION GAP: 10 (ref 5–15)
AST: 21 U/L (ref 15–41)
BUN: 13 mg/dL (ref 8–23)
CALCIUM: 9.1 mg/dL (ref 8.9–10.3)
CO2: 27 mmol/L (ref 22–32)
Chloride: 100 mmol/L (ref 98–111)
Creatinine, Ser: 1.29 mg/dL — ABNORMAL HIGH (ref 0.61–1.24)
GFR calc Af Amer: >60 mL/min (ref >60)
GFR calc non Af Amer: 53 mL/min — ABNORMAL LOW (ref >60)
GLUCOSE: 148 mg/dL — AB (ref 70–99)
Potassium: 4.1 mmol/L (ref 3.5–5.1)
SODIUM: 137 mmol/L (ref 135–145)
Total Bilirubin: 0.6 mg/dL (ref 0.3–1.2)
Total Protein: 6.8 g/dL (ref 6.5–8.1)

## 2018-10-27 MED ORDER — DIPHENHYDRAMINE HCL 50 MG/ML IJ SOLN
25.0000 mg | Freq: Once | INTRAMUSCULAR | Status: AC
  Administered 2018-10-27: 25 mg via INTRAVENOUS
  Filled 2018-10-27: qty 1

## 2018-10-27 MED ORDER — TEMSIROLIMUS CHEMO INJECTION 25 MG/ML W/DILUENT
25.0000 mg | Freq: Once | INTRAVENOUS | Status: AC
  Administered 2018-10-27: 25 mg via INTRAVENOUS
  Filled 2018-10-27: qty 2.5

## 2018-10-27 MED ORDER — PROCHLORPERAZINE MALEATE 10 MG PO TABS
10.0000 mg | ORAL_TABLET | Freq: Once | ORAL | Status: AC
  Administered 2018-10-27: 10 mg via ORAL
  Filled 2018-10-27: qty 1

## 2018-10-27 MED ORDER — SODIUM CHLORIDE 0.9 % IV SOLN
INTRAVENOUS | Status: AC
  Administered 2018-10-27: 11:00:00 via INTRAVENOUS
  Filled 2018-10-27 (×2): qty 250

## 2018-10-27 MED ORDER — SODIUM CHLORIDE 0.9 % IV SOLN
Freq: Once | INTRAVENOUS | Status: AC
  Filled 2018-10-27: qty 250

## 2018-10-27 NOTE — Progress Notes (Signed)
Reviewed the CT scan large lytic lesion noted in the right glenoid process.  Evaluated radiation oncology rec to start radiation next week.

## 2018-10-30 ENCOUNTER — Ambulatory Visit
Admission: RE | Admit: 2018-10-30 | Discharge: 2018-10-30 | Disposition: A | Discharge status: Home / Self Care | Payer: PPO | Source: Ambulatory Visit | Attending: Radiation Oncology | Admitting: Radiation Oncology

## 2018-10-30 DIAGNOSIS — C7951 Secondary malignant neoplasm of bone: Secondary | ICD-10-CM | POA: Diagnosis not present

## 2018-10-30 DIAGNOSIS — C642 Malignant neoplasm of left kidney, except renal pelvis: Secondary | ICD-10-CM | POA: Diagnosis not present

## 2018-10-30 DIAGNOSIS — Z510 Encounter for antineoplastic radiation therapy: Secondary | ICD-10-CM | POA: Diagnosis not present

## 2018-11-03 ENCOUNTER — Encounter: Payer: Self-pay | Admitting: Internal Medicine

## 2018-11-03 ENCOUNTER — Other Ambulatory Visit: Payer: Self-pay

## 2018-11-03 ENCOUNTER — Inpatient Hospital Stay (HOSPITAL_BASED_OUTPATIENT_CLINIC_OR_DEPARTMENT_OTHER): Payer: PPO | Admitting: Internal Medicine

## 2018-11-03 ENCOUNTER — Ambulatory Visit
Admission: RE | Admit: 2018-11-03 | Discharge: 2018-11-03 | Disposition: A | Discharge status: Home / Self Care | Payer: PPO | Source: Ambulatory Visit | Attending: Radiation Oncology | Admitting: Radiation Oncology

## 2018-11-03 ENCOUNTER — Inpatient Hospital Stay: Payer: PPO

## 2018-11-03 VITALS — BP 148/88 | HR 93 | Temp 98.6°F | Resp 18

## 2018-11-03 DIAGNOSIS — R918 Other nonspecific abnormal finding of lung field: Secondary | ICD-10-CM | POA: Diagnosis not present

## 2018-11-03 DIAGNOSIS — I2510 Atherosclerotic heart disease of native coronary artery without angina pectoris: Secondary | ICD-10-CM | POA: Diagnosis not present

## 2018-11-03 DIAGNOSIS — Z7982 Long term (current) use of aspirin: Secondary | ICD-10-CM

## 2018-11-03 DIAGNOSIS — E079 Disorder of thyroid, unspecified: Secondary | ICD-10-CM | POA: Diagnosis not present

## 2018-11-03 DIAGNOSIS — Z87891 Personal history of nicotine dependence: Secondary | ICD-10-CM

## 2018-11-03 DIAGNOSIS — I10 Essential (primary) hypertension: Secondary | ICD-10-CM

## 2018-11-03 DIAGNOSIS — R5383 Other fatigue: Secondary | ICD-10-CM | POA: Diagnosis not present

## 2018-11-03 DIAGNOSIS — C7800 Secondary malignant neoplasm of unspecified lung: Secondary | ICD-10-CM | POA: Diagnosis not present

## 2018-11-03 DIAGNOSIS — M899 Disorder of bone, unspecified: Secondary | ICD-10-CM

## 2018-11-03 DIAGNOSIS — J449 Chronic obstructive pulmonary disease, unspecified: Secondary | ICD-10-CM | POA: Diagnosis not present

## 2018-11-03 DIAGNOSIS — M25512 Pain in left shoulder: Secondary | ICD-10-CM | POA: Diagnosis not present

## 2018-11-03 DIAGNOSIS — Z5111 Encounter for antineoplastic chemotherapy: Secondary | ICD-10-CM

## 2018-11-03 DIAGNOSIS — Z510 Encounter for antineoplastic radiation therapy: Secondary | ICD-10-CM | POA: Diagnosis not present

## 2018-11-03 DIAGNOSIS — Z85820 Personal history of malignant melanoma of skin: Secondary | ICD-10-CM

## 2018-11-03 DIAGNOSIS — C642 Malignant neoplasm of left kidney, except renal pelvis: Secondary | ICD-10-CM

## 2018-11-03 DIAGNOSIS — Z79899 Other long term (current) drug therapy: Secondary | ICD-10-CM

## 2018-11-03 DIAGNOSIS — R739 Hyperglycemia, unspecified: Secondary | ICD-10-CM | POA: Diagnosis not present

## 2018-11-03 DIAGNOSIS — Z87442 Personal history of urinary calculi: Secondary | ICD-10-CM

## 2018-11-03 LAB — COMPREHENSIVE METABOLIC PANEL
ALT: 18 U/L (ref 0–44)
ANION GAP: 11 (ref 5–15)
AST: 24 U/L (ref 15–41)
Albumin: 3.5 g/dL (ref 3.5–5.0)
Alkaline Phosphatase: 62 U/L (ref 38–126)
BUN: 13 mg/dL (ref 8–23)
CO2: 21 mmol/L — ABNORMAL LOW (ref 22–32)
Calcium: 9.5 mg/dL (ref 8.9–10.3)
Chloride: 101 mmol/L (ref 98–111)
Creatinine, Ser: 1.06 mg/dL (ref 0.61–1.24)
GFR calc Af Amer: >60 mL/min (ref >60)
GFR calc non Af Amer: >60 mL/min (ref >60)
Glucose, Bld: 121 mg/dL — ABNORMAL HIGH (ref 70–99)
Potassium: 4 mmol/L (ref 3.5–5.1)
Sodium: 133 mmol/L — ABNORMAL LOW (ref 135–145)
TOTAL PROTEIN: 6.7 g/dL (ref 6.5–8.1)
Total Bilirubin: 0.5 mg/dL (ref 0.3–1.2)

## 2018-11-03 LAB — CBC WITH DIFFERENTIAL/PLATELET
Abs Immature Granulocytes: 0.03 10*3/uL (ref 0.00–0.07)
BASOS PCT: 1 %
Basophils Absolute: 0 10*3/uL (ref 0.0–0.1)
Eosinophils Absolute: 0.1 10*3/uL (ref 0.0–0.5)
Eosinophils Relative: 2 %
HCT: 38.3 % — ABNORMAL LOW (ref 39.0–52.0)
Hemoglobin: 13 g/dL (ref 13.0–17.0)
Immature Granulocytes: 1 %
Lymphocytes Relative: 12 %
Lymphs Abs: 0.7 10*3/uL (ref 0.7–4.0)
MCH: 31.7 pg (ref 26.0–34.0)
MCHC: 33.9 g/dL (ref 30.0–36.0)
MCV: 93.4 fL (ref 80.0–100.0)
Monocytes Absolute: 0.7 10*3/uL (ref 0.1–1.0)
Monocytes Relative: 11 %
Neutro Abs: 4.5 10*3/uL (ref 1.7–7.7)
Neutrophils Relative %: 73 %
Platelets: 170 10*3/uL (ref 150–400)
RBC: 4.1 MIL/uL — ABNORMAL LOW (ref 4.22–5.81)
RDW: 14.2 % (ref 11.5–15.5)
WBC: 6 10*3/uL (ref 4.0–10.5)
nRBC: 0 % (ref 0.0–0.2)

## 2018-11-03 MED ORDER — TEMSIROLIMUS CHEMO INJECTION 25 MG/ML W/DILUENT
25.0000 mg | Freq: Once | INTRAVENOUS | Status: AC
  Administered 2018-11-03: 25 mg via INTRAVENOUS
  Filled 2018-11-03: qty 2.5

## 2018-11-03 MED ORDER — SODIUM CHLORIDE 0.9 % IV SOLN
Freq: Once | INTRAVENOUS | Status: AC
  Administered 2018-11-03: 11:00:00 via INTRAVENOUS
  Filled 2018-11-03: qty 250

## 2018-11-03 MED ORDER — PROCHLORPERAZINE MALEATE 10 MG PO TABS
10.0000 mg | ORAL_TABLET | Freq: Once | ORAL | Status: AC
  Administered 2018-11-03: 10 mg via ORAL
  Filled 2018-11-03: qty 1

## 2018-11-03 MED ORDER — DIPHENHYDRAMINE HCL 50 MG/ML IJ SOLN
25.0000 mg | Freq: Once | INTRAMUSCULAR | Status: AC
  Administered 2018-11-03: 25 mg via INTRAVENOUS
  Filled 2018-11-03: qty 1

## 2018-11-03 NOTE — Progress Notes (Signed)
[name]  Patient Care Team: Dion Body, MD as PCP - General (Family Medicine) Jannet Mantis, MD (Dermatology) Bary Castilla, Forest Gleason, MD (General Surgery) Hollice Espy, MD as Consulting Physician (Urology) Cammie Sickle, MD as Medical Oncologist (Medical Oncology) Isaias Cowman, MD as Consulting Physician (Cardiology) Harriette Bouillon, MD as Referring Physician (Cardiology)  Cancer Staging No matching staging information was found for the patient.   Oncology History   # MAY- June 2017- METASTATIC CLEAR CELL; LEFT RENAL CA/STAGE IV; [place]- G-3; INTERMEDIATE RISK;  [bil Pul lung nodules;incidental s/p Bx Dr.Byrnett/Dr.Oaks] s/p Cytoreductive nephrectomy; Dr.Brandon- [id number]; July 11th 2017 CT- Enlarging Lung nodules  # Aug 7th 2017- PAZOPANIB; STOP SEP 2017 [pancreatitis/poor tol]  # OCT 1 week- START NIVO q 2W x4; DEC 8th CT- "Progression"; Continue Opdivo;APRIL-MAY 2018- STABLE LUNG NODULES [stopped sec to severe cough; NO Pneumonitis]  # May 31st 2018- Cabo 92m/day; July 31st CT lung- improved lung nodules; Sep 1st week- cabo- 468mday [dose reduced sec to mult intol]; Oct 1st 2week-Cabo [id number];   # NOV 15th 2018- CT chest- slight progression of lung nodules [poor tol to higer doses];   # Nov 28th 2018- SUTENT 50 mg 2w-on  &1 w-OFF;  # MARCH 2019- Progression; start Axitinib 5 mg BID; July 2019- MIXED response; worsening/new skeletal lesions/stable to improved lung lesions; STOP Axitinib;   # Left hip s/p RT [mid-AUG 2019]  # AUG 12th- RE-START CABO 40 mg/day; STOP SEP 2019- sec to intol [abdominal pain even on 64m42my]; [facility] W  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];STAGE I  0.42 millimeter depth; s/p WLE [Dr.Byrnett]  ---------------------------------------------------------    # March 2019-  Molecular testing- PDL-1 NEG; TMB-Low; MSI-STABLE; No targets; VHL mutation of  unknown significance  -----------------------------------------------------   # Dx; Kidney cancer/ clear cell STAGE IV  Current treatment: Torisel [oct 23rd ]  Goal: Palliative     Cancer of left kidney excluding renal pelvis (HCC)[place] -  Chemotherapy    The patient had temsirolimus (TORISEL) 25 mg in sodium chloride 0.9 % 250 mL chemo infusion, 25 mg, Intravenous,  Once, 9 of 14 cycles Administration: 25 mg (09/10/2018), 25 mg (09/17/2018), 25 mg (09/24/2018), 25 mg (10/08/2018), 25 mg (10/20/2018), 25 mg (10/01/2018), 25 mg (10/27/2018), 25 mg (11/03/2018)  for chemotherapy treatment.        INTERVAL HISTORY:  [place]85.  male pleasant patient above history of metastatic renal cell cancer currently on Torisel status post cycle #5 is here for follow-up.  Patient has been evaluated by radiation oncology.  He is going to start radiation to his left shoulder today.  He continues to complain of pain is left shoulder.  He is currently on fentanyl patch.  He has been taking Tylenol for breakthrough pain.  States " Dilaudid" not helping anyways.  Denies any headaches but denies any nausea vomiting.  Difficulty sleeping at night because of pain.  Review of Systems  Constitutional: Positive for malaise/fatigue. Negative for chills, diaphoresis and fever.  HENT: Negative for nosebleeds and sore throat.   Eyes: Negative for double vision.  Respiratory: Negative for cough, hemoptysis, sputum production, shortness of breath and wheezing.   Cardiovascular: Negative for chest pain, palpitations, orthopnea and leg swelling.  Gastrointestinal: Negative for blood in stool, constipation, heartburn, melena and vomiting.  Genitourinary: Negative for dysuria,  frequency and urgency.  Musculoskeletal: Positive for joint pain (left shoulder pain).  Skin: Negative.  Negative for itching and rash.   Neurological: Negative for tingling, focal weakness and weakness.  Endo/Heme/Allergies: Does not bruise/bleed easily.  Psychiatric/Behavioral: Negative for depression. The patient has insomnia. The patient is not nervous/anxious.       PAST MEDICAL HISTORY :  Past Medical History:  Diagnosis Date  . CAD (coronary artery disease)   . Cancer ([place])    left arm  . COPD (chronic obstructive pulmonary disease) ([place])   . Hypertension   . Kidney stone   . Lung cancer ([place])   . Melanoma ([place]) 01/24/2016   right shoulder  . Thyroid nodule 02/02/2016   left BENIGN THYROID NODULE by FNA    PAST SURGICAL HISTORY :   Past Surgical History:  Procedure Laterality Date  . arm surgery Left    arm  . cardiac stents  2011   Angioplasty / Stenting Femoral-X2  . COLONOSCOPY  04/01/12  . CORONARY ANGIOPLASTY    . EXCISION MELANOMA WITH SENTINEL LYMPH NODE BIOPSY Right 01/24/2016   Procedure: EXCISION MELANOMA Right Shoulder;  Surgeon: Robert Bellow, MD;  Location: [facility] ORS;  Service: General;  Laterality: Right;  . LAPAROSCOPIC NEPHRECTOMY, HAND ASSISTED Left 04/24/2016   Procedure: HAND ASSISTED LAPAROSCOPIC NEPHRECTOMY;  Surgeon: Hollice Espy, MD;  Location: [facility] ORS;  Service: Urology;  Laterality: Left;  Marland Kitchen VIDEO ASSISTED THORACOSCOPY (VATS)/THOROCOTOMY Left 03/08/2016   Procedure: VIDEO ASSISTED THORACOSCOPY (VATS) with lung biopsy - Left ;  Surgeon: Robert Bellow, MD;  Location: [facility] ORS;  Service: General;  Laterality: Left;    FAMILY HISTORY :   Family History  Problem Relation Age of Onset  . Hodgkin's lymphoma Daughter 5  . Heart attack Father   . Kidney cancer Neg Hx   . Kidney disease Neg Hx   . Prostate cancer Neg Hx     SOCIAL HISTORY:   Social History   Tobacco Use  . Smoking status: Former Smoker    Packs/day: 2.00    Years: 30.00    Pack years: 60.00    Types: Cigarettes    Start date: 01/18/1956    Last attempt to quit: 01/17/1969    Years since quitting: 49.8   . Smokeless tobacco: Never Used  Substance Use Topics  . Alcohol use: No    Alcohol/week: 0.0 standard drinks    Comment: BEER OCC  . Drug use: No    ALLERGIES:  is allergic to lisinopril and other.  MEDICATIONS:  Current Outpatient Medications  Medication Sig Dispense Refill  . acetaminophen (TYLENOL) 500 MG tablet Take 500 mg by mouth every 6 (six) hours as needed for moderate pain.    Marland Kitchen albuterol (PROVENTIL HFA;VENTOLIN HFA) 108 (90 Base) MCG/ACT inhaler Inhale 2 puffs into the lungs every 6 (six) hours as needed for wheezing or shortness of breath. 1 Inhaler 2  . aspirin EC 81 MG tablet Take 81 mg by mouth daily.    . Aspirin-Salicylamide-Caffeine (BC HEADACHE POWDER PO) Take 1 packet by mouth daily.    Marland Kitchen atorvastatin (LIPITOR) 40 MG tablet Take 40 mg by mouth daily.    . cholecalciferol (VITAMIN D) 400 units TABS tablet Take 400 Units by mouth daily.    . clopidogrel (PLAVIX) 75 MG tablet Take 75 mg by mouth daily.    . Coenzyme Q10 (COQ10) 100 MG CAPS Take 1 capsule by mouth daily.    Marland Kitchen dicyclomine (BENTYL) 20 MG tablet  Take 1 tablet (20 mg total) by mouth 3 (three) times daily before meals. 60 tablet 0  . dronabinol (MARINOL) 2.5 MG capsule Take 1 capsule (2.5 mg total) by mouth 2 (two) times daily before a meal. 60 capsule 1  . DULoxetine (CYMBALTA) 30 MG capsule Take 2 capsules by mouth daily.    . fentaNYL (DURAGESIC - DOSED MCG/HR) 25 MCG/HR patch Place 1 patch (25 mcg total) onto the skin every 3 (three) days. 10 patch 0  . fluticasone (FLONASE) 50 MCG/ACT nasal spray Place 2 sprays into both nostrils daily. 16 g 2  . Fluticasone-Salmeterol (ADVAIR DISKUS) 500-50 MCG/DOSE AEPB Inhale 1 puff into the lungs 2 (two) times daily. 60 each 11  . HYDROmorphone (DILAUDID) 2 MG tablet Take 1 tablet (2 mg total) by mouth every 6 (six) hours as needed for severe pain. 60 tablet 0  . ipratropium-albuterol (DUONEB) 0.5-2.5 (3) MG/3ML SOLN Take 3 mLs by nebulization every 4 (four) hours  as needed. 360 mL 3  . loratadine (CLARITIN) 10 MG tablet Take 10 mg by mouth daily.    Marland Kitchen MELATONIN PO Take 10 mg by mouth at bedtime.     . metFORMIN (GLUCOPHAGE) 500 MG tablet Take 1 tablet (500 mg total) by mouth 2 (two) times daily with a meal. 60 tablet 3  . Misc Natural Products (GLUCOSAMINE CHOND COMPLEX/MSM) TABS Take 2 tablets by mouth daily.    . montelukast (SINGULAIR) 10 MG tablet TAKE 1 TABLET BY MOUTH EVERYDAY AT BEDTIME 90 tablet 1  . nitroGLYCERIN (NITROSTAT) 0.4 MG SL tablet Place 0.4 mg under the tongue every 5 (five) minutes as needed.     . Omega-3 Fatty Acids (FISH OIL) 1000 MG CPDR Take 1 capsule by mouth daily.    . OXYGEN Inhale 2 L into the lungs at bedtime.    . tamsulosin (FLOMAX) 0.4 MG CAPS capsule TAKE 1 CAPSULE (0.4 MG TOTAL) BY MOUTH EVERY MORNING. 30 capsule 3  . Tiotropium Bromide Monohydrate (SPIRIVA RESPIMAT) 1.25 MCG/ACT AERS Inhale 1.25 Act into the lungs 2 (two) times daily. 1 Inhaler 11  . tiZANidine (ZANAFLEX) 4 MG tablet Take 4 mg by mouth daily.     . Turmeric 500 MG TABS Take 1 tablet by mouth daily.    . diphenoxylate-atropine (LOMOTIL) 2.5-0.025 MG tablet Take 2 tablets by mouth 4 (four) times daily as needed for diarrhea or loose stools. (Patient not taking: Reported on 11/03/2018) 30 tablet 0  . docusate sodium (COLACE) 100 MG capsule Take 1 capsule (100 mg total) by mouth 2 (two) times daily. (Patient not taking: Reported on 11/03/2018) 60 capsule 3  . ferrous sulfate 325 (65 FE) MG EC tablet Take 325 mg by mouth 3 (three) times daily with meals.    Marland Kitchen loperamide (IMODIUM) 1 MG/5ML solution Take 4 mg by mouth as needed for diarrhea or loose stools.    . ondansetron (ZOFRAN) 8 MG tablet Take 1 tablet (8 mg total) by mouth every 8 (eight) hours as needed for nausea or vomiting. (Patient not taking: Reported on 11/03/2018) 60 tablet 0   No current facility-administered medications for this visit.    Facility-Administered Medications Ordered in Other  Visits  Medication Dose Route Frequency Provider Last Rate Last Dose  . 0.9 %  sodium chloride infusion   Intravenous Continuous Cammie Sickle, MD 10 mL/hr at 10/27/18 1054      PHYSICAL EXAMINATION: ECOG PERFORMANCE STATUS: 1 - Symptomatic but completely ambulatory  BP (!) 148/88  Pulse 93   Temp 98.6 F (37 C) (Tympanic)   Resp 18   There were no vitals filed for this visit.  Physical Exam  Constitutional: He is oriented to person, place, and time and well-developed, well-nourished, and in no distress.  Accompanied by his wife/daughter. He is walking by himself.  HENT:  Head: Normocephalic and atraumatic.  Mouth/Throat: Oropharynx is clear and moist. No oropharyngeal exudate.  Eyes: Pupils are equal, round, and reactive to light.  Neck: Normal range of motion. Neck supple.  Cardiovascular: Normal rate and regular rhythm.  Pulmonary/Chest: No respiratory distress. He has no wheezes.  Abdominal: Soft. Bowel sounds are normal. He exhibits no distension and no mass. There is no guarding.  Musculoskeletal:        General: No tenderness or edema.     Comments: Left shoulder decreased range of motion pain with passive and active movements.  Neurological: He is alert and oriented to person, place, and time.  Skin: Skin is warm.  Psychiatric: Affect normal.       LABORATORY DATA:  I have reviewed the data as listed    Component Value Date/Time   NA 133 (L) 11/03/2018 0902   NA 146 (H) 05/25/2016 1514   K 4.0 11/03/2018 0902   CL 101 11/03/2018 0902   CO2 21 (L) 11/03/2018 0902   GLUCOSE 121 (H) 11/03/2018 0902   BUN 13 11/03/2018 0902   BUN 19 05/25/2016 1514   CREATININE 1.06 11/03/2018 0902   CALCIUM 9.5 11/03/2018 0902   PROT 6.7 11/03/2018 0902   ALBUMIN 3.5 11/03/2018 0902   AST 24 11/03/2018 0902   ALT 18 11/03/2018 0902   ALKPHOS 62 11/03/2018 0902   BILITOT 0.5 11/03/2018 0902   GFRNONAA >60 11/03/2018 0902   GFRAA >60 11/03/2018 0902    No  results found for: SPEP, UPEP  Lab Results  Component Value Date   WBC 6.0 11/03/2018   NEUTROABS 4.5 11/03/2018   HGB 13.0 11/03/2018   HCT 38.3 (L) 11/03/2018   MCV 93.4 11/03/2018   PLT 170 11/03/2018      Chemistry      Component Value Date/Time   NA 133 (L) 11/03/2018 0902   NA 146 (H) 05/25/2016 1514   K 4.0 11/03/2018 0902   CL 101 11/03/2018 0902   CO2 21 (L) 11/03/2018 0902   BUN 13 11/03/2018 0902   BUN 19 05/25/2016 1514   CREATININE 1.06 11/03/2018 0902      Component Value Date/Time   CALCIUM 9.5 11/03/2018 0902   ALKPHOS 62 11/03/2018 0902   AST 24 11/03/2018 0902   ALT 18 11/03/2018 0902   BILITOT 0.5 11/03/2018 0902       RADIOGRAPHIC STUDIES: I have personally reviewed the radiological images as listed and agreed with the findings in the report. No results found.   ASSESSMENT & PLAN:  Cancer of left kidney excluding renal pelvis [facility])  # Left kidney cancer metastatic to the lung- June 2019 CT scan shows stable to improved lung lesions; however multiple lytic lesions in the bones.- MIXED RESPONSE. Currently status post 5 cycles of torisel. STABLE.  #  Proceed with # 6 treatment today; Labs today reviewed;  acceptable for treatment today.   Will order imaging in 2-4 weeks from now.   #  Elevated blood sugars-postprandial 232.  Secondary to Torisel improved on metformin 500 BID.  Continue same.  # Left shoulder fracture-? Path fracture/lytic lesions in bone- reviewed the CT scan- on  RT [started 12/16]; continue fenatyl ptach; and tylenol prn.   # Labile hypertension-systolic-148/stable.  Monitor closely.  DISPOSITION:  # Treatment today- Torisel/  # 1 week- cbc/bmp-Torisel/X-geva # follow up in 2 weeks- MD/labs- cbc/cmp;Torisel;- Dr.B    Orders Placed This Encounter  Procedures  . CBC with Differential    Standing Status:   Standing    Number of Occurrences:   2    Standing Expiration Date:   11/04/2019  . Basic metabolic panel    Standing  Status:   Standing    Number of Occurrences:   2    Standing Expiration Date:   11/04/2019   All questions were answered. The patient knows to call the clinic with any problems, questions or concerns.      Cammie Sickle, MD 11/03/2018 6:57 PM

## 2018-11-03 NOTE — Assessment & Plan Note (Addendum)
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 Currently status post 5 cycles of torisel. STABLE.  #  Proceed with # 6 treatment today; Labs today reviewed;  acceptable for treatment today.   Will order imaging in 2-4 weeks from now.   #  Elevated blood sugars-postprandial 232.  Secondary to Torisel improved on metformin 500 BID.  Continue same.  # Left shoulder fracture-? Path fracture/lytic lesions in bone- reviewed the CT scan- on RT [started 12/16]; continue fenatyl ptach; and tylenol prn.   # Labile hypertension-systolic-148/stable.  Monitor closely.  DISPOSITION:  # Treatment today- Torisel/  # 1 week- cbc/bmp-Torisel/X-geva # follow up in 2 weeks- MD/labs- cbc/cmp;Torisel;- Dr.B

## 2018-11-04 ENCOUNTER — Ambulatory Visit
Admission: RE | Admit: 2018-11-04 | Discharge: 2018-11-04 | Disposition: A | Discharge status: Home / Self Care | Payer: PPO | Source: Ambulatory Visit | Attending: Radiation Oncology | Admitting: Radiation Oncology

## 2018-11-04 DIAGNOSIS — Z510 Encounter for antineoplastic radiation therapy: Secondary | ICD-10-CM | POA: Diagnosis not present

## 2018-11-05 ENCOUNTER — Ambulatory Visit
Admission: RE | Admit: 2018-11-05 | Discharge: 2018-11-05 | Disposition: A | Discharge status: Home / Self Care | Payer: PPO | Source: Ambulatory Visit | Attending: Radiation Oncology | Admitting: Radiation Oncology

## 2018-11-05 DIAGNOSIS — Z510 Encounter for antineoplastic radiation therapy: Secondary | ICD-10-CM | POA: Diagnosis not present

## 2018-11-06 ENCOUNTER — Ambulatory Visit
Admission: RE | Admit: 2018-11-06 | Discharge: 2018-11-06 | Disposition: A | Discharge status: Home / Self Care | Payer: PPO | Source: Ambulatory Visit | Attending: Radiation Oncology | Admitting: Radiation Oncology

## 2018-11-06 DIAGNOSIS — Z510 Encounter for antineoplastic radiation therapy: Secondary | ICD-10-CM | POA: Diagnosis not present

## 2018-11-07 ENCOUNTER — Ambulatory Visit
Admission: RE | Admit: 2018-11-07 | Discharge: 2018-11-07 | Disposition: A | Discharge status: Home / Self Care | Payer: PPO | Source: Ambulatory Visit | Attending: Radiation Oncology | Admitting: Radiation Oncology

## 2018-11-07 DIAGNOSIS — C7951 Secondary malignant neoplasm of bone: Secondary | ICD-10-CM | POA: Diagnosis not present

## 2018-11-07 DIAGNOSIS — Z510 Encounter for antineoplastic radiation therapy: Secondary | ICD-10-CM | POA: Diagnosis not present

## 2018-11-07 DIAGNOSIS — C642 Malignant neoplasm of left kidney, except renal pelvis: Secondary | ICD-10-CM | POA: Diagnosis not present

## 2018-11-10 ENCOUNTER — Inpatient Hospital Stay: Payer: PPO

## 2018-11-10 ENCOUNTER — Ambulatory Visit
Admission: RE | Admit: 2018-11-10 | Discharge: 2018-11-10 | Disposition: A | Discharge status: Home / Self Care | Payer: PPO | Source: Ambulatory Visit | Attending: Radiation Oncology | Admitting: Radiation Oncology

## 2018-11-10 ENCOUNTER — Other Ambulatory Visit: Payer: Self-pay | Admitting: Nurse Practitioner

## 2018-11-10 DIAGNOSIS — Z510 Encounter for antineoplastic radiation therapy: Secondary | ICD-10-CM | POA: Diagnosis not present

## 2018-11-11 ENCOUNTER — Ambulatory Visit
Admission: RE | Admit: 2018-11-11 | Discharge: 2018-11-11 | Disposition: A | Discharge status: Home / Self Care | Payer: PPO | Source: Ambulatory Visit | Attending: Radiation Oncology | Admitting: Radiation Oncology

## 2018-11-11 ENCOUNTER — Other Ambulatory Visit: Payer: Self-pay | Admitting: *Deleted

## 2018-11-11 DIAGNOSIS — Z510 Encounter for antineoplastic radiation therapy: Secondary | ICD-10-CM | POA: Diagnosis not present

## 2018-11-11 MED ORDER — AZITHROMYCIN 250 MG PO TABS: ORAL_TABLET | ORAL | 0 refills | Status: DC

## 2018-11-13 ENCOUNTER — Ambulatory Visit
Admission: RE | Admit: 2018-11-13 | Discharge: 2018-11-13 | Disposition: A | Discharge status: Home / Self Care | Payer: PPO | Source: Ambulatory Visit | Attending: Radiation Oncology | Admitting: Radiation Oncology

## 2018-11-13 ENCOUNTER — Other Ambulatory Visit: Payer: Self-pay | Admitting: *Deleted

## 2018-11-13 DIAGNOSIS — C642 Malignant neoplasm of left kidney, except renal pelvis: Secondary | ICD-10-CM

## 2018-11-13 DIAGNOSIS — C44622 Squamous cell carcinoma of skin of right upper limb, including shoulder: Secondary | ICD-10-CM

## 2018-11-13 DIAGNOSIS — Z510 Encounter for antineoplastic radiation therapy: Secondary | ICD-10-CM | POA: Diagnosis not present

## 2018-11-14 ENCOUNTER — Ambulatory Visit
Admission: RE | Admit: 2018-11-14 | Discharge: 2018-11-14 | Disposition: A | Discharge status: Home / Self Care | Payer: PPO | Source: Ambulatory Visit | Attending: Radiation Oncology | Admitting: Radiation Oncology

## 2018-11-14 DIAGNOSIS — Z510 Encounter for antineoplastic radiation therapy: Secondary | ICD-10-CM | POA: Diagnosis not present

## 2018-11-14 NOTE — Telephone Encounter (Signed)
Per Beckey Rutter, NP's request, called patient to check on him re: Flomax refill and medication effectiveness. Left voice mail message for patient to call me back.      dhs

## 2018-11-17 ENCOUNTER — Inpatient Hospital Stay (HOSPITAL_BASED_OUTPATIENT_CLINIC_OR_DEPARTMENT_OTHER): Payer: PPO | Admitting: Internal Medicine

## 2018-11-17 ENCOUNTER — Inpatient Hospital Stay: Payer: PPO

## 2018-11-17 ENCOUNTER — Encounter: Payer: Self-pay | Admitting: Internal Medicine

## 2018-11-17 ENCOUNTER — Ambulatory Visit
Admission: RE | Admit: 2018-11-17 | Discharge: 2018-11-17 | Disposition: A | Discharge status: Home / Self Care | Payer: PPO | Source: Ambulatory Visit | Attending: Radiation Oncology | Admitting: Radiation Oncology

## 2018-11-17 ENCOUNTER — Other Ambulatory Visit: Payer: Self-pay

## 2018-11-17 VITALS — BP 128/73 | HR 102 | Temp 97.6°F | Resp 20 | Ht 73.0 in | Wt 190.0 lb

## 2018-11-17 DIAGNOSIS — Z5111 Encounter for antineoplastic chemotherapy: Secondary | ICD-10-CM

## 2018-11-17 DIAGNOSIS — R739 Hyperglycemia, unspecified: Secondary | ICD-10-CM | POA: Diagnosis not present

## 2018-11-17 DIAGNOSIS — M899 Disorder of bone, unspecified: Secondary | ICD-10-CM

## 2018-11-17 DIAGNOSIS — C642 Malignant neoplasm of left kidney, except renal pelvis: Secondary | ICD-10-CM

## 2018-11-17 DIAGNOSIS — Z87891 Personal history of nicotine dependence: Secondary | ICD-10-CM

## 2018-11-17 DIAGNOSIS — Z79899 Other long term (current) drug therapy: Secondary | ICD-10-CM

## 2018-11-17 DIAGNOSIS — I10 Essential (primary) hypertension: Secondary | ICD-10-CM | POA: Diagnosis not present

## 2018-11-17 DIAGNOSIS — R5383 Other fatigue: Secondary | ICD-10-CM

## 2018-11-17 DIAGNOSIS — E079 Disorder of thyroid, unspecified: Secondary | ICD-10-CM | POA: Diagnosis not present

## 2018-11-17 DIAGNOSIS — I2510 Atherosclerotic heart disease of native coronary artery without angina pectoris: Secondary | ICD-10-CM

## 2018-11-17 DIAGNOSIS — C7951 Secondary malignant neoplasm of bone: Secondary | ICD-10-CM | POA: Diagnosis not present

## 2018-11-17 DIAGNOSIS — M25512 Pain in left shoulder: Secondary | ICD-10-CM | POA: Diagnosis not present

## 2018-11-17 DIAGNOSIS — Z7982 Long term (current) use of aspirin: Secondary | ICD-10-CM

## 2018-11-17 DIAGNOSIS — Z85820 Personal history of malignant melanoma of skin: Secondary | ICD-10-CM

## 2018-11-17 DIAGNOSIS — J449 Chronic obstructive pulmonary disease, unspecified: Secondary | ICD-10-CM

## 2018-11-17 DIAGNOSIS — R918 Other nonspecific abnormal finding of lung field: Secondary | ICD-10-CM

## 2018-11-17 DIAGNOSIS — Z510 Encounter for antineoplastic radiation therapy: Secondary | ICD-10-CM | POA: Diagnosis not present

## 2018-11-17 DIAGNOSIS — Z87442 Personal history of urinary calculi: Secondary | ICD-10-CM

## 2018-11-17 DIAGNOSIS — C7800 Secondary malignant neoplasm of unspecified lung: Secondary | ICD-10-CM | POA: Diagnosis not present

## 2018-11-17 LAB — CBC WITH DIFFERENTIAL/PLATELET
ABS IMMATURE GRANULOCYTES: 0.03 10*3/uL (ref 0.00–0.07)
Basophils Absolute: 0 10*3/uL (ref 0.0–0.1)
Basophils Relative: 0 %
Eosinophils Absolute: 0.1 10*3/uL (ref 0.0–0.5)
Eosinophils Relative: 1 %
HCT: 36.2 % — ABNORMAL LOW (ref 39.0–52.0)
Hemoglobin: 11.9 g/dL — ABNORMAL LOW (ref 13.0–17.0)
Immature Granulocytes: 1 %
Lymphocytes Relative: 8 %
Lymphs Abs: 0.4 10*3/uL — ABNORMAL LOW (ref 0.7–4.0)
MCH: 29.7 pg (ref 26.0–34.0)
MCHC: 32.9 g/dL (ref 30.0–36.0)
MCV: 90.3 fL (ref 80.0–100.0)
Monocytes Absolute: 0.6 10*3/uL (ref 0.1–1.0)
Monocytes Relative: 11 %
Neutro Abs: 4.3 10*3/uL (ref 1.7–7.7)
Neutrophils Relative %: 79 %
Platelets: 158 10*3/uL (ref 150–400)
RBC: 4.01 MIL/uL — AB (ref 4.22–5.81)
RDW: 15.9 % — ABNORMAL HIGH (ref 11.5–15.5)
WBC: 5.5 10*3/uL (ref 4.0–10.5)
nRBC: 0 % (ref 0.0–0.2)

## 2018-11-17 LAB — COMPREHENSIVE METABOLIC PANEL
ALT: 20 U/L (ref 0–44)
AST: 24 U/L (ref 15–41)
Albumin: 3.4 g/dL — ABNORMAL LOW (ref 3.5–5.0)
Alkaline Phosphatase: 66 U/L (ref 38–126)
Anion gap: 7 (ref 5–15)
BUN: 12 mg/dL (ref 8–23)
CO2: 27 mmol/L (ref 22–32)
Calcium: 8.6 mg/dL — ABNORMAL LOW (ref 8.9–10.3)
Chloride: 101 mmol/L (ref 98–111)
Creatinine, Ser: 1.2 mg/dL (ref 0.61–1.24)
GFR calc Af Amer: >60 mL/min (ref >60)
GFR calc non Af Amer: 58 mL/min — ABNORMAL LOW (ref >60)
Glucose, Bld: 160 mg/dL — ABNORMAL HIGH (ref 70–99)
POTASSIUM: 3.9 mmol/L (ref 3.5–5.1)
Sodium: 135 mmol/L (ref 135–145)
Total Bilirubin: 0.9 mg/dL (ref 0.3–1.2)
Total Protein: 6.5 g/dL (ref 6.5–8.1)

## 2018-11-17 MED ORDER — DIPHENHYDRAMINE HCL 50 MG/ML IJ SOLN
25.0000 mg | Freq: Once | INTRAMUSCULAR | Status: AC
  Administered 2018-11-17: 25 mg via INTRAVENOUS
  Filled 2018-11-17: qty 1

## 2018-11-17 MED ORDER — TEMSIROLIMUS CHEMO INJECTION 25 MG/ML W/DILUENT
25.0000 mg | Freq: Once | INTRAVENOUS | Status: AC
  Administered 2018-11-17: 25 mg via INTRAVENOUS
  Filled 2018-11-17: qty 2.5

## 2018-11-17 MED ORDER — PROCHLORPERAZINE MALEATE 10 MG PO TABS
10.0000 mg | ORAL_TABLET | Freq: Once | ORAL | Status: AC
  Administered 2018-11-17: 10 mg via ORAL
  Filled 2018-11-17: qty 1

## 2018-11-17 MED ORDER — SODIUM CHLORIDE 0.9 % IV SOLN
Freq: Once | INTRAVENOUS | Status: AC
  Administered 2018-11-17: 09:00:00 via INTRAVENOUS
  Filled 2018-11-17: qty 250

## 2018-11-17 NOTE — Progress Notes (Signed)
[place] OFFICE PROGRESS NOTE  Patient Care Team: Dion Body, MD as PCP - General (Family Medicine) Jannet Mantis, MD (Dermatology) Bary Castilla, Forest Gleason, MD (General Surgery) Hollice Espy, MD as Consulting Physician (Urology) Cammie Sickle, MD as Medical Oncologist (Medical Oncology) Isaias Cowman, MD as Consulting Physician (Cardiology) Harriette Bouillon, MD as Referring Physician (Cardiology)  Cancer Staging No matching staging information was found for the patient.   Oncology History   # MAY- June 2017- METASTATIC CLEAR CELL; LEFT RENAL CA/STAGE IV; [place]- G-3; INTERMEDIATE RISK;  [bil Pul lung nodules;incidental s/p Bx Dr.Byrnett/Dr.Oaks] s/p Cytoreductive nephrectomy; Dr.Brandon- [id number]; July 11th 2017 CT- Enlarging Lung nodules  # Aug 7th 2017- PAZOPANIB; STOP SEP 2017 [pancreatitis/poor tol]  # OCT 1 week- START NIVO q 2W x4; DEC 8th CT- "Progression"; Continue Opdivo;APRIL-MAY 2018- STABLE LUNG NODULES [stopped sec to severe cough; NO Pneumonitis]  # May 31st 2018- Cabo 26m/day; July 31st CT lung- improved lung nodules; Sep 1st week- cabo- 455mday [dose reduced sec to mult intol]; Oct 1st 2week-Cabo 2062may;   # NOV 15th 2018- CT chest- slight progression of lung nodules [poor tol to higer doses];   # Nov 28th 2018- SUTENT 50 mg 2w-on  &1 w-OFF;  # MARCH 2019- Progression; start Axitinib 5 mg BID; July 2019- MIXED response; worsening/new skeletal lesions/stable to improved lung lesions; STOP Axitinib;   # Left hip s/p RT [mid-AUG 2019]  # AUG 12th- RE-START CABO 40 mg/day; STOP SEP 2019- sec to intol [abdominal pain even on 55m28my]; [facility] W  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];STAGE I  0.42 millimeter depth; s/p WLE [Dr.Byrnett]  ---------------------------------------------------------    # March 2019-  Molecular testing- PDL-1 NEG; TMB-Low; MSI-STABLE; No targets; VHL mutation of  unknown significance  -----------------------------------------------------   # Dx; Kidney cancer/ clear cell STAGE IV  Current treatment: Torisel [oct 23rd ]  Goal: Palliative     Cancer of left kidney excluding renal pelvis (HCC)[place]10/23/2019 -  Chemotherapy    The patient had temsirolimus (TORISEL) 25 mg in sodium chloride 0.9 % 250 mL chemo infusion, 25 mg, Intravenous,  Once, 10 of 14 cycles Administration: 25 mg (09/10/2018), 25 mg (09/17/2018), 25 mg (09/24/2018), 25 mg (10/08/2018), 25 mg (10/20/2018), 25 mg (10/01/2018), 25 mg (10/27/2018), 25 mg (11/03/2018), 25 mg (11/17/2018)  for chemotherapy treatment.        INTERVAL HISTORY:  [place]61.  male pleasant patient above history of metastatic renal cell cancer currently on Torisel weekly is here for follow-up.  Patient is currently getting radiation to his left shoulder; last treatment today.  Patient noted to have significant improvement of his left shoulder pain.  He is taking Tylenol as needed.  Is not taking Dilaudid or fentanyl patch as recommended in the past.  His appetite is fair.  No nausea no vomiting.  Has any headaches.  No falls.  Review of Systems  Constitutional: Positive for malaise/fatigue. Negative for chills, diaphoresis and fever.  HENT: Negative for nosebleeds and sore throat.   Eyes: Negative for double vision.  Respiratory: Negative for cough, hemoptysis, sputum production, shortness of breath and wheezing.   Cardiovascular: Negative for chest pain, palpitations, orthopnea and leg swelling.  Gastrointestinal: Negative for blood in stool, constipation, heartburn, melena and vomiting.  Genitourinary: Negative for dysuria, frequency and urgency.  Musculoskeletal: Positive for joint  pain (left shoulder pain).  Skin: Negative.  Negative for itching and rash.  Neurological: Negative for tingling, focal weakness  and weakness.  Endo/Heme/Allergies: Does not bruise/bleed easily.  Psychiatric/Behavioral: Negative for depression. The patient has insomnia. The patient is not nervous/anxious.       PAST MEDICAL HISTORY :  Past Medical History:  Diagnosis Date  . CAD (coronary artery disease)   . Cancer ([place])    left arm  . COPD (chronic obstructive pulmonary disease) ([place])   . Hypertension   . Kidney stone   . Lung cancer ([place])   . Melanoma ([place]) 01/24/2016   right shoulder  . Thyroid nodule 02/02/2016   left BENIGN THYROID NODULE by FNA    PAST SURGICAL HISTORY :   Past Surgical History:  Procedure Laterality Date  . arm surgery Left    arm  . cardiac stents  2011   Angioplasty / Stenting Femoral-X2  . COLONOSCOPY  04/01/12  . CORONARY ANGIOPLASTY    . EXCISION MELANOMA WITH SENTINEL LYMPH NODE BIOPSY Right 01/24/2016   Procedure: EXCISION MELANOMA Right Shoulder;  Surgeon: Robert Bellow, MD;  Location: [facility] ORS;  Service: General;  Laterality: Right;  . LAPAROSCOPIC NEPHRECTOMY, HAND ASSISTED Left 04/24/2016   Procedure: HAND ASSISTED LAPAROSCOPIC NEPHRECTOMY;  Surgeon: Hollice Espy, MD;  Location: [facility] ORS;  Service: Urology;  Laterality: Left;  Marland Kitchen VIDEO ASSISTED THORACOSCOPY (VATS)/THOROCOTOMY Left 03/08/2016   Procedure: VIDEO ASSISTED THORACOSCOPY (VATS) with lung biopsy - Left ;  Surgeon: Robert Bellow, MD;  Location: [facility] ORS;  Service: General;  Laterality: Left;    FAMILY HISTORY :   Family History  Problem Relation Age of Onset  . Hodgkin's lymphoma Daughter 5  . Heart attack Father   . Kidney cancer Neg Hx   . Kidney disease Neg Hx   . Prostate cancer Neg Hx     SOCIAL HISTORY:   Social History   Tobacco Use  . Smoking status: Former Smoker    Packs/day: 2.00    Years: 30.00    Pack years: 60.00    Types: Cigarettes    Start date: 01/18/1956    Last attempt to quit: 01/17/1969    Years since quitting: 49.8  . Smokeless tobacco: Never Used  Substance Use  Topics  . Alcohol use: No    Alcohol/week: 0.0 standard drinks    Comment: BEER OCC  . Drug use: No    ALLERGIES:  is allergic to lisinopril and other.  MEDICATIONS:  Current Outpatient Medications  Medication Sig Dispense Refill  . acetaminophen (TYLENOL) 500 MG tablet Take 500 mg by mouth every 6 (six) hours as needed for moderate pain.    Marland Kitchen albuterol (PROVENTIL HFA;VENTOLIN HFA) 108 (90 Base) MCG/ACT inhaler Inhale 2 puffs into the lungs every 6 (six) hours as needed for wheezing or shortness of breath. 1 Inhaler 2  . aspirin EC 81 MG tablet Take 81 mg by mouth daily.    . Aspirin-Salicylamide-Caffeine (BC HEADACHE POWDER PO) Take 1 packet by mouth daily.    Marland Kitchen atorvastatin (LIPITOR) 40 MG tablet Take 40 mg by mouth daily.    . cholecalciferol (VITAMIN D) 400 units TABS tablet Take 400 Units by mouth daily.    . clopidogrel (PLAVIX) 75 MG tablet Take 75 mg by mouth daily.    . Coenzyme Q10 (COQ10) 100 MG CAPS Take 1 capsule by mouth daily.    Marland Kitchen dicyclomine (BENTYL) 20 MG tablet Take 1 tablet (20 mg total) by mouth  3 (three) times daily before meals. 60 tablet 0  . DULoxetine (CYMBALTA) 30 MG capsule Take 2 capsules by mouth daily.    . fentaNYL (DURAGESIC - DOSED MCG/HR) 25 MCG/HR patch Place 1 patch (25 mcg total) onto the skin every 3 (three) days. 10 patch 0  . Fluticasone-Salmeterol (ADVAIR DISKUS) 500-50 MCG/DOSE AEPB Inhale 1 puff into the lungs 2 (two) times daily. 60 each 11  . ipratropium-albuterol (DUONEB) 0.5-2.5 (3) MG/3ML SOLN Take 3 mLs by nebulization every 4 (four) hours as needed. 360 mL 3  . loperamide (IMODIUM) 1 MG/5ML solution Take 4 mg by mouth as needed for diarrhea or loose stools.    Marland Kitchen loratadine (CLARITIN) 10 MG tablet Take 10 mg by mouth daily.    Marland Kitchen MELATONIN PO Take 10 mg by mouth at bedtime.     . metFORMIN (GLUCOPHAGE) 500 MG tablet Take 1 tablet (500 mg total) by mouth 2 (two) times daily with a meal. 60 tablet 3  . Misc Natural Products (GLUCOSAMINE  CHOND COMPLEX/MSM) TABS Take 2 tablets by mouth daily.    . montelukast (SINGULAIR) 10 MG tablet TAKE 1 TABLET BY MOUTH EVERYDAY AT BEDTIME 90 tablet 1  . nitroGLYCERIN (NITROSTAT) 0.4 MG SL tablet Place 0.4 mg under the tongue every 5 (five) minutes as needed.     . Omega-3 Fatty Acids (FISH OIL) 1000 MG CPDR Take 1 capsule by mouth daily.    . OXYGEN Inhale 2 L into the lungs at bedtime.    . tamsulosin (FLOMAX) 0.4 MG CAPS capsule TAKE 1 CAPSULE (0.4 MG TOTAL) BY MOUTH EVERY MORNING. 30 capsule 3  . Tiotropium Bromide Monohydrate (SPIRIVA RESPIMAT) 1.25 MCG/ACT AERS Inhale 1.25 Act into the lungs 2 (two) times daily. 1 Inhaler 11  . tiZANidine (ZANAFLEX) 4 MG tablet Take 4 mg by mouth daily.     . Turmeric 500 MG TABS Take 1 tablet by mouth daily.    . diphenoxylate-atropine (LOMOTIL) 2.5-0.025 MG tablet Take 2 tablets by mouth 4 (four) times daily as needed for diarrhea or loose stools. (Patient not taking: Reported on 11/03/2018) 30 tablet 0  . docusate sodium (COLACE) 100 MG capsule Take 1 capsule (100 mg total) by mouth 2 (two) times daily. (Patient not taking: Reported on 11/03/2018) 60 capsule 3  . dronabinol (MARINOL) 2.5 MG capsule Take 1 capsule (2.5 mg total) by mouth 2 (two) times daily before a meal. (Patient not taking: Reported on 11/17/2018) 60 capsule 1  . ferrous sulfate 325 (65 FE) MG EC tablet Take 325 mg by mouth 3 (three) times daily with meals.    . fluticasone (FLONASE) 50 MCG/ACT nasal spray Place 2 sprays into both nostrils daily. 16 g 2  . HYDROmorphone (DILAUDID) 2 MG tablet Take 1 tablet (2 mg total) by mouth every 6 (six) hours as needed for severe pain. (Patient not taking: Reported on 11/17/2018) 60 tablet 0  . ondansetron (ZOFRAN) 8 MG tablet Take 1 tablet (8 mg total) by mouth every 8 (eight) hours as needed for nausea or vomiting. (Patient not taking: Reported on 11/03/2018) 60 tablet 0   No current facility-administered medications for this visit.     Facility-Administered Medications Ordered in Other Visits  Medication Dose Route Frequency Provider Last Rate Last Dose  . 0.9 %  sodium chloride infusion   Intravenous Continuous Cammie Sickle, MD 10 mL/hr at 10/27/18 1054      PHYSICAL EXAMINATION: ECOG PERFORMANCE STATUS: 1 - Symptomatic but completely ambulatory  BP  128/73 (BP Location: Left Arm, Patient Position: Sitting)   Pulse (!) 102   Temp 97.6 F (36.4 C) (Oral)   Resp 20   Ht 6' 1" (1.854 m)   Wt 190 lb (86.2 kg)   BMI 25.07 kg/m   Filed Weights   11/17/18 0843  Weight: 190 lb (86.2 kg)    Physical Exam  Constitutional: He is oriented to person, place, and time and well-developed, well-nourished, and in no distress.  Accompanied by his wife/daughter. He is walking by himself.  HENT:  Head: Normocephalic and atraumatic.  Mouth/Throat: Oropharynx is clear and moist. No oropharyngeal exudate.  Eyes: Pupils are equal, round, and reactive to light.  Neck: Normal range of motion. Neck supple.  Cardiovascular: Normal rate and regular rhythm.  Pulmonary/Chest: No respiratory distress. He has no wheezes.  Abdominal: Soft. Bowel sounds are normal. He exhibits no distension and no mass. There is no guarding.  Musculoskeletal:        General: No tenderness or edema.     Comments: Left shoulder decreased range of motion pain with passive and active movements.  Neurological: He is alert and oriented to person, place, and time.  Skin: Skin is warm.  Psychiatric: Affect normal.       LABORATORY DATA:  I have reviewed the data as listed    Component Value Date/Time   NA 135 11/17/2018 0813   NA 146 (H) 05/25/2016 1514   K 3.9 11/17/2018 0813   CL 101 11/17/2018 0813   CO2 27 11/17/2018 0813   GLUCOSE 160 (H) 11/17/2018 0813   BUN 12 11/17/2018 0813   BUN 19 05/25/2016 1514   CREATININE 1.20 11/17/2018 0813   CALCIUM 8.6 (L) 11/17/2018 0813   PROT 6.5 11/17/2018 0813   ALBUMIN 3.4 (L) 11/17/2018 0813    AST 24 11/17/2018 0813   ALT 20 11/17/2018 0813   ALKPHOS 66 11/17/2018 0813   BILITOT 0.9 11/17/2018 0813   GFRNONAA 58 (L) 11/17/2018 0813   GFRAA >60 11/17/2018 0813    No results found for: SPEP, UPEP  Lab Results  Component Value Date   WBC 5.5 11/17/2018   NEUTROABS 4.3 11/17/2018   HGB 11.9 (L) 11/17/2018   HCT 36.2 (L) 11/17/2018   MCV 90.3 11/17/2018   PLT 158 11/17/2018      Chemistry      Component Value Date/Time   NA 135 11/17/2018 0813   NA 146 (H) 05/25/2016 1514   K 3.9 11/17/2018 0813   CL 101 11/17/2018 0813   CO2 27 11/17/2018 0813   BUN 12 11/17/2018 0813   BUN 19 05/25/2016 1514   CREATININE 1.20 11/17/2018 0813      Component Value Date/Time   CALCIUM 8.6 (L) 11/17/2018 0813   ALKPHOS 66 11/17/2018 0813   AST 24 11/17/2018 0813   ALT 20 11/17/2018 0813   BILITOT 0.9 11/17/2018 0813       RADIOGRAPHIC STUDIES: I have personally reviewed the radiological images as listed and agreed with the findings in the report. No results found.   ASSESSMENT & PLAN:  Cancer of left kidney excluding renal pelvis [facility])  # Left kidney cancer metastatic to the lung- June 2019 CT scan shows stable to improved lung lesions; however multiple lytic lesions in the bones.- MIXED RESPONSE. Currently status post  cycles 10 of torisel. STABLE.  #  Proceed with # 11 treatment today; Labs today reviewed;  acceptable for treatment today.   Will order imaging today.   #  Elevated blood sugars- Secondary to Torisel improved on metformin 500 BID. STABLE.   # Left shoulder fracture-? Path fracture/lytic lesions in bone- reviewed the CT scan- on RT [last 12/30]; continue tylenol prn.  Improved.   # Labile hypertension-systolic-148/stable.  Monitor closely.  DISPOSITION:  # Treatment today- Torisel/  # 1 week- cbc/bmp-Torisel # follow up in 2 weeks- MD/labs- cbc/cmp;Torisel;X-geva;CT scan C/A/P-- Dr.B    Orders Placed This Encounter  Procedures  . CT CHEST W  CONTRAST    Standing Status:   Future    Standing Expiration Date:   11/18/2019    Order Specific Question:   If indicated for the ordered procedure, I authorize the administration of contrast media per Radiology protocol    Answer:   Yes    Order Specific Question:   Preferred imaging location?    Answer:   [facility]    Order Specific Question:   Radiology Contrast Protocol - do NOT remove file path    Answer:   _0 [email or web address]    Order Specific Question:   ** REASON FOR EXAM (FREE TEXT)    Answer:   kidney cancer;please compare to scans done at [place] on 08/21/2018.  Marland Kitchen CT Abdomen Pelvis W Contrast    Standing Status:   Future    Standing Expiration Date:   11/17/2019    Order Specific Question:   ** REASON FOR EXAM (FREE TEXT)    Answer:   kidney cancer- please compare to CT C/A/P done at [place] on 08/21/2018    Order Specific Question:   If indicated for the ordered procedure, I authorize the administration of contrast media per Radiology protocol    Answer:   Yes    Order Specific Question:   Preferred imaging location?    Answer:   [facility]    Order Specific Question:   Is Oral Contrast requested for this exam?    Answer:   Yes, Per Radiology protocol    Order Specific Question:   Radiology Contrast Protocol - do NOT remove file path    Answer:   _1 [email or web address]   All questions were answered. The patient knows to call the clinic with any problems, questions or concerns.      Cammie Sickle, MD 11/18/2018 12:07 PM

## 2018-11-17 NOTE — Assessment & Plan Note (Addendum)
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 Currently status post  cycles 10 of torisel. STABLE.  #  Proceed with # 11 treatment today; Labs today reviewed;  acceptable for treatment today.   Will order imaging today.   #  Elevated blood sugars- Secondary to Torisel improved on metformin 500 BID. STABLE.   # Left shoulder fracture-? Path fracture/lytic lesions in bone- reviewed the CT scan- on RT [last 12/30]; continue tylenol prn.  Improved.   # Labile hypertension-systolic-148/stable.  Monitor closely.  DISPOSITION:  # Treatment today- Torisel/  # 1 week- cbc/bmp-Torisel # follow up in 2 weeks- MD/labs- cbc/cmp;Torisel;X-geva;CT scan C/A/P-- Dr.B

## 2018-11-19 DIAGNOSIS — R05 Cough: Secondary | ICD-10-CM | POA: Diagnosis not present

## 2018-11-19 DIAGNOSIS — R0602 Shortness of breath: Secondary | ICD-10-CM | POA: Diagnosis not present

## 2018-11-19 DIAGNOSIS — J449 Chronic obstructive pulmonary disease, unspecified: Secondary | ICD-10-CM | POA: Diagnosis not present

## 2018-11-19 DIAGNOSIS — C642 Malignant neoplasm of left kidney, except renal pelvis: Secondary | ICD-10-CM | POA: Diagnosis not present

## 2018-11-21 NOTE — Telephone Encounter (Signed)
Called patient to follow up again on requested refill for flomax. Unclear if patient is taking. Will deny request for now and if he is taking, can consider re-starting.

## 2018-11-24 ENCOUNTER — Inpatient Hospital Stay: Payer: PPO

## 2018-11-24 ENCOUNTER — Inpatient Hospital Stay: Payer: PPO | Attending: Internal Medicine

## 2018-11-24 VITALS — BP 156/95 | HR 84 | Temp 96.2°F | Resp 20 | Wt 186.2 lb

## 2018-11-24 DIAGNOSIS — Z87442 Personal history of urinary calculi: Secondary | ICD-10-CM | POA: Diagnosis not present

## 2018-11-24 DIAGNOSIS — Z5111 Encounter for antineoplastic chemotherapy: Secondary | ICD-10-CM | POA: Insufficient documentation

## 2018-11-24 DIAGNOSIS — M25512 Pain in left shoulder: Secondary | ICD-10-CM | POA: Insufficient documentation

## 2018-11-24 DIAGNOSIS — C7801 Secondary malignant neoplasm of right lung: Secondary | ICD-10-CM | POA: Diagnosis not present

## 2018-11-24 DIAGNOSIS — R739 Hyperglycemia, unspecified: Secondary | ICD-10-CM | POA: Diagnosis not present

## 2018-11-24 DIAGNOSIS — E079 Disorder of thyroid, unspecified: Secondary | ICD-10-CM | POA: Insufficient documentation

## 2018-11-24 DIAGNOSIS — Z7984 Long term (current) use of oral hypoglycemic drugs: Secondary | ICD-10-CM | POA: Insufficient documentation

## 2018-11-24 DIAGNOSIS — Z87891 Personal history of nicotine dependence: Secondary | ICD-10-CM | POA: Insufficient documentation

## 2018-11-24 DIAGNOSIS — Z7982 Long term (current) use of aspirin: Secondary | ICD-10-CM | POA: Diagnosis not present

## 2018-11-24 DIAGNOSIS — I2510 Atherosclerotic heart disease of native coronary artery without angina pectoris: Secondary | ICD-10-CM | POA: Diagnosis not present

## 2018-11-24 DIAGNOSIS — Z79899 Other long term (current) drug therapy: Secondary | ICD-10-CM | POA: Diagnosis not present

## 2018-11-24 DIAGNOSIS — Z515 Encounter for palliative care: Secondary | ICD-10-CM | POA: Diagnosis not present

## 2018-11-24 DIAGNOSIS — I10 Essential (primary) hypertension: Secondary | ICD-10-CM | POA: Diagnosis not present

## 2018-11-24 DIAGNOSIS — J449 Chronic obstructive pulmonary disease, unspecified: Secondary | ICD-10-CM | POA: Diagnosis not present

## 2018-11-24 DIAGNOSIS — C7951 Secondary malignant neoplasm of bone: Secondary | ICD-10-CM | POA: Insufficient documentation

## 2018-11-24 DIAGNOSIS — G893 Neoplasm related pain (acute) (chronic): Secondary | ICD-10-CM | POA: Diagnosis not present

## 2018-11-24 DIAGNOSIS — Z85820 Personal history of malignant melanoma of skin: Secondary | ICD-10-CM | POA: Insufficient documentation

## 2018-11-24 DIAGNOSIS — C642 Malignant neoplasm of left kidney, except renal pelvis: Secondary | ICD-10-CM | POA: Insufficient documentation

## 2018-11-24 LAB — CBC WITH DIFFERENTIAL/PLATELET
Abs Immature Granulocytes: 0.04 10*3/uL (ref 0.00–0.07)
Basophils Absolute: 0 10*3/uL (ref 0.0–0.1)
Basophils Relative: 0 %
Eosinophils Absolute: 0.2 10*3/uL (ref 0.0–0.5)
Eosinophils Relative: 2 %
HCT: 39.5 % (ref 39.0–52.0)
Hemoglobin: 13 g/dL (ref 13.0–17.0)
Immature Granulocytes: 1 %
LYMPHS PCT: 10 %
Lymphs Abs: 0.7 10*3/uL (ref 0.7–4.0)
MCH: 29.8 pg (ref 26.0–34.0)
MCHC: 32.9 g/dL (ref 30.0–36.0)
MCV: 90.6 fL (ref 80.0–100.0)
MONO ABS: 0.4 10*3/uL (ref 0.1–1.0)
Monocytes Relative: 6 %
Neutro Abs: 5.5 10*3/uL (ref 1.7–7.7)
Neutrophils Relative %: 81 %
Platelets: 164 10*3/uL (ref 150–400)
RBC: 4.36 MIL/uL (ref 4.22–5.81)
RDW: 16 % — ABNORMAL HIGH (ref 11.5–15.5)
WBC: 6.9 10*3/uL (ref 4.0–10.5)
nRBC: 0 % (ref 0.0–0.2)

## 2018-11-24 LAB — BASIC METABOLIC PANEL
Anion gap: 10 (ref 5–15)
BUN: 9 mg/dL (ref 8–23)
CO2: 25 mmol/L (ref 22–32)
Calcium: 8.9 mg/dL (ref 8.9–10.3)
Chloride: 105 mmol/L (ref 98–111)
Creatinine, Ser: 1.14 mg/dL (ref 0.61–1.24)
GFR calc Af Amer: >60 mL/min (ref >60)
GFR calc non Af Amer: >60 mL/min (ref >60)
Glucose, Bld: 149 mg/dL — ABNORMAL HIGH (ref 70–99)
Potassium: 3.8 mmol/L (ref 3.5–5.1)
SODIUM: 140 mmol/L (ref 135–145)

## 2018-11-24 MED ORDER — SODIUM CHLORIDE 0.9 % IV SOLN
Freq: Once | INTRAVENOUS | Status: AC
  Administered 2018-11-24: 10:00:00 via INTRAVENOUS
  Filled 2018-11-24: qty 250

## 2018-11-24 MED ORDER — PROCHLORPERAZINE MALEATE 10 MG PO TABS
10.0000 mg | ORAL_TABLET | Freq: Once | ORAL | Status: AC
  Administered 2018-11-24: 10 mg via ORAL
  Filled 2018-11-24: qty 1

## 2018-11-24 MED ORDER — TEMSIROLIMUS CHEMO INJECTION 25 MG/ML W/DILUENT
25.0000 mg | Freq: Once | INTRAVENOUS | Status: AC
  Administered 2018-11-24: 25 mg via INTRAVENOUS
  Filled 2018-11-24: qty 2.5

## 2018-11-24 MED ORDER — DIPHENHYDRAMINE HCL 50 MG/ML IJ SOLN
25.0000 mg | Freq: Once | INTRAMUSCULAR | Status: AC
  Administered 2018-11-24: 25 mg via INTRAVENOUS
  Filled 2018-11-24: qty 1

## 2018-11-24 NOTE — Progress Notes (Signed)
Per Magda Paganini, RN-patient reports rash on left shoulder. 0950-I spoke with Ander Purpura, NP. She will evaluate patient in infusion suite.

## 2018-11-28 ENCOUNTER — Ambulatory Visit
Admission: RE | Admit: 2018-11-28 | Discharge: 2018-11-28 | Disposition: A | Discharge status: Home / Self Care | Payer: PPO | Source: Ambulatory Visit | Attending: Internal Medicine | Admitting: Internal Medicine

## 2018-11-28 DIAGNOSIS — C7800 Secondary malignant neoplasm of unspecified lung: Secondary | ICD-10-CM | POA: Diagnosis not present

## 2018-11-28 DIAGNOSIS — C642 Malignant neoplasm of left kidney, except renal pelvis: Secondary | ICD-10-CM | POA: Diagnosis not present

## 2018-11-28 DIAGNOSIS — Z8553 Personal history of malignant neoplasm of renal pelvis: Secondary | ICD-10-CM | POA: Diagnosis not present

## 2018-11-28 MED ORDER — IOPAMIDOL (ISOVUE-300) INJECTION 61%
100.0000 mL | Freq: Once | INTRAVENOUS | Status: AC | PRN
  Administered 2018-11-28: 100 mL via INTRAVENOUS

## 2018-12-01 ENCOUNTER — Telehealth: Payer: Self-pay | Admitting: Pharmacy Technician

## 2018-12-01 ENCOUNTER — Other Ambulatory Visit: Payer: Self-pay

## 2018-12-01 ENCOUNTER — Ambulatory Visit
Admission: RE | Admit: 2018-12-01 | Discharge: 2018-12-01 | Disposition: A | Discharge status: Home / Self Care | Payer: PPO | Source: Ambulatory Visit | Attending: Internal Medicine | Admitting: Internal Medicine

## 2018-12-01 ENCOUNTER — Ambulatory Visit
Admission: RE | Admit: 2018-12-01 | Discharge: 2018-12-01 | Disposition: A | Discharge status: Home / Self Care | Payer: PPO | Attending: Internal Medicine | Admitting: Internal Medicine

## 2018-12-01 ENCOUNTER — Inpatient Hospital Stay (HOSPITAL_BASED_OUTPATIENT_CLINIC_OR_DEPARTMENT_OTHER): Payer: PPO | Admitting: Internal Medicine

## 2018-12-01 ENCOUNTER — Inpatient Hospital Stay: Payer: PPO

## 2018-12-01 ENCOUNTER — Telehealth: Payer: Self-pay | Admitting: Pharmacist

## 2018-12-01 VITALS — BP 136/87 | HR 99 | Temp 97.0°F | Resp 22

## 2018-12-01 DIAGNOSIS — Z87442 Personal history of urinary calculi: Secondary | ICD-10-CM

## 2018-12-01 DIAGNOSIS — Z7984 Long term (current) use of oral hypoglycemic drugs: Secondary | ICD-10-CM

## 2018-12-01 DIAGNOSIS — J449 Chronic obstructive pulmonary disease, unspecified: Secondary | ICD-10-CM

## 2018-12-01 DIAGNOSIS — Z79899 Other long term (current) drug therapy: Secondary | ICD-10-CM

## 2018-12-01 DIAGNOSIS — C642 Malignant neoplasm of left kidney, except renal pelvis: Secondary | ICD-10-CM

## 2018-12-01 DIAGNOSIS — Z7982 Long term (current) use of aspirin: Secondary | ICD-10-CM

## 2018-12-01 DIAGNOSIS — I10 Essential (primary) hypertension: Secondary | ICD-10-CM | POA: Diagnosis not present

## 2018-12-01 DIAGNOSIS — Z87891 Personal history of nicotine dependence: Secondary | ICD-10-CM

## 2018-12-01 DIAGNOSIS — M25512 Pain in left shoulder: Secondary | ICD-10-CM

## 2018-12-01 DIAGNOSIS — Z85528 Personal history of other malignant neoplasm of kidney: Secondary | ICD-10-CM | POA: Diagnosis not present

## 2018-12-01 DIAGNOSIS — R739 Hyperglycemia, unspecified: Secondary | ICD-10-CM

## 2018-12-01 DIAGNOSIS — Z5111 Encounter for antineoplastic chemotherapy: Secondary | ICD-10-CM | POA: Diagnosis not present

## 2018-12-01 DIAGNOSIS — C7951 Secondary malignant neoplasm of bone: Secondary | ICD-10-CM

## 2018-12-01 DIAGNOSIS — E079 Disorder of thyroid, unspecified: Secondary | ICD-10-CM | POA: Diagnosis not present

## 2018-12-01 DIAGNOSIS — I2510 Atherosclerotic heart disease of native coronary artery without angina pectoris: Secondary | ICD-10-CM

## 2018-12-01 DIAGNOSIS — C7801 Secondary malignant neoplasm of right lung: Secondary | ICD-10-CM

## 2018-12-01 DIAGNOSIS — C9000 Multiple myeloma not having achieved remission: Secondary | ICD-10-CM | POA: Diagnosis not present

## 2018-12-01 DIAGNOSIS — Z85820 Personal history of malignant melanoma of skin: Secondary | ICD-10-CM | POA: Diagnosis not present

## 2018-12-01 LAB — BASIC METABOLIC PANEL
Anion gap: 8 (ref 5–15)
BUN: 19 mg/dL (ref 8–23)
CALCIUM: 9.2 mg/dL (ref 8.9–10.3)
CO2: 27 mmol/L (ref 22–32)
Chloride: 105 mmol/L (ref 98–111)
Creatinine, Ser: 1.2 mg/dL (ref 0.61–1.24)
GFR calc Af Amer: >60 mL/min (ref >60)
GFR calc non Af Amer: 58 mL/min — ABNORMAL LOW (ref >60)
Glucose, Bld: 198 mg/dL — ABNORMAL HIGH (ref 70–99)
Potassium: 4.1 mmol/L (ref 3.5–5.1)
Sodium: 140 mmol/L (ref 135–145)

## 2018-12-01 LAB — CBC WITH DIFFERENTIAL/PLATELET
Abs Immature Granulocytes: 0.01 10*3/uL (ref 0.00–0.07)
Basophils Absolute: 0 10*3/uL (ref 0.0–0.1)
Basophils Relative: 0 %
Eosinophils Absolute: 0.1 10*3/uL (ref 0.0–0.5)
Eosinophils Relative: 2 %
HCT: 38.4 % — ABNORMAL LOW (ref 39.0–52.0)
Hemoglobin: 12.5 g/dL — ABNORMAL LOW (ref 13.0–17.0)
Immature Granulocytes: 0 %
Lymphocytes Relative: 12 %
Lymphs Abs: 0.6 10*3/uL — ABNORMAL LOW (ref 0.7–4.0)
MCH: 29.5 pg (ref 26.0–34.0)
MCHC: 32.6 g/dL (ref 30.0–36.0)
MCV: 90.6 fL (ref 80.0–100.0)
MONO ABS: 0.3 10*3/uL (ref 0.1–1.0)
MONOS PCT: 6 %
Neutro Abs: 3.5 10*3/uL (ref 1.7–7.7)
Neutrophils Relative %: 80 %
Platelets: 129 10*3/uL — ABNORMAL LOW (ref 150–400)
RBC: 4.24 MIL/uL (ref 4.22–5.81)
RDW: 15.8 % — ABNORMAL HIGH (ref 11.5–15.5)
WBC: 4.5 10*3/uL (ref 4.0–10.5)
nRBC: 0 % (ref 0.0–0.2)

## 2018-12-01 MED ORDER — LENVATINIB (12 MG DAILY DOSE) 3 X 4 MG PO CPPK: 12.0000 mg | ORAL_CAPSULE | Freq: Every day | ORAL | 4 refills | Status: DC

## 2018-12-01 MED ORDER — HYDROMORPHONE HCL 2 MG PO TABS: 2.0000 mg | ORAL_TABLET | Freq: Four times a day (QID) | ORAL | 0 refills | Status: DC | PRN

## 2018-12-01 MED ORDER — DENOSUMAB 120 MG/1.7ML ~~LOC~~ SOLN
120.0000 mg | Freq: Once | SUBCUTANEOUS | Status: AC
  Administered 2018-12-01: 120 mg via SUBCUTANEOUS
  Filled 2018-12-01: qty 1.7

## 2018-12-01 NOTE — Progress Notes (Signed)
[place] OFFICE PROGRESS NOTE  Patient Care Team: Dion Body, MD as PCP - General (Family Medicine) Jannet Mantis, MD (Dermatology) Bary Castilla, Forest Gleason, MD (General Surgery) Hollice Espy, MD as Consulting Physician (Urology) Cammie Sickle, MD as Medical Oncologist (Medical Oncology) Isaias Cowman, MD as Consulting Physician (Cardiology) Harriette Bouillon, MD as Referring Physician (Cardiology)  Cancer Staging No matching staging information was found for the patient.   Oncology History   # MAY- June 2017- METASTATIC CLEAR CELL; LEFT RENAL CA/STAGE IV; [place]- G-3; INTERMEDIATE RISK;  [bil Pul lung nodules;incidental s/p Bx Dr.Byrnett/Dr.Oaks] s/p Cytoreductive nephrectomy; Dr.Brandon- [id number]; July 11th 2017 CT- Enlarging Lung nodules  # Aug 7th 2017- PAZOPANIB; STOP SEP 2017 [pancreatitis/poor tol]  # OCT 1 week- START NIVO q 2W x4; DEC 8th CT- "Progression"; Continue Opdivo;APRIL-MAY 2018- STABLE LUNG NODULES [stopped sec to severe cough; NO Pneumonitis]  # May 31st 2018- Cabo 52m/day; July 31st CT lung- improved lung nodules; Sep 1st week- cabo- 435mday [dose reduced sec to mult intol]; Oct 1st 2week-Cabo 2049may;   # NOV 15th 2018- CT chest- slight progression of lung nodules [poor tol to higer doses];   # Nov 28th 2018- SUTENT 50 mg 2w-on  &1 w-OFF;  # MARCH 2019- Progression; start Axitinib 5 mg BID; July 2019- MIXED response; worsening/new skeletal lesions/stable to improved lung lesions; STOP Axitinib;   # Left hip s/p RT [mid-AUG 2019]  # AUG 12th- RE-START CABO 40 mg/day; STOP SEP 2019- sec to intol [abdominal pain even on 76m72my]; [facility]EL IV q W x 12 treatments; May 28, 2019-progression of the lung nodules bone lesions.  # Jan 2020- 13th-Lenvima 12 mg.  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];STAGE I  0.42 millimeter  depth; s/p WLE [Dr.Byrnett]  ---------------------------------------------------------    # March 2019- Molecular testing- PDL-1 NEG; TMB-Low; MSI-STABLE; No targets; VHL mutation of  unknown significance  -----------------------------------------------------   # Dx; Kidney cancer/ clear cell STAGE IV  Current treatment: Lenvima 12mg55m  Goal: Palliative     Cancer of left kidney excluding renal pelvis (HCC)[facility]  INTERVAL HISTORY:  [place].81  male pleasant patient above history of metastatic renal cell cancer currently on Torisel weekly is here for follow-up/review the results CT scan.  Patient notes to have worsening pain in his left mid leg; for the last few weeks.  Progressive getting worse.  States to have had local trauma few weeks ago.  He is not taking pain pills.  He states his left shoulder pain is improved.  Chronic mild back pain; denies any worsening back pain. his appetite is fair.  No nausea no vomiting.  Has any headaches.  No falls.  Review of Systems  Constitutional: Positive for malaise/fatigue. Negative for chills, diaphoresis and fever.  HENT: Negative for nosebleeds and sore throat.   Eyes: Negative for double vision.  Respiratory: Negative for cough, hemoptysis, sputum production, shortness of breath and wheezing.   Cardiovascular: Negative for chest pain, palpitations, orthopnea and leg swelling.  Gastrointestinal: Negative for blood in stool, constipation, heartburn, melena and vomiting.  Genitourinary: Negative for dysuria, frequency and urgency.  Musculoskeletal: Positive for back pain and joint pain (left shoulder pain).       Approximate 2 cm nodule noted on the left mid tibia.  Skin: Negative.  Negative for itching and rash.  Neurological: Negative for  tingling, focal weakness and weakness.  Endo/Heme/Allergies: Does not bruise/bleed easily.  Psychiatric/Behavioral: Negative for depression. The patient has insomnia. The patient is not  nervous/anxious.       PAST MEDICAL HISTORY :  Past Medical History:  Diagnosis Date  . CAD (coronary artery disease)   . Cancer ([place])    left arm  . COPD (chronic obstructive pulmonary disease) ([place])   . Hypertension   . Kidney stone   . Lung cancer ([place])   . Melanoma ([place]) 01/24/2016   right shoulder  . Thyroid nodule 02/02/2016   left BENIGN THYROID NODULE by FNA    PAST SURGICAL HISTORY :   Past Surgical History:  Procedure Laterality Date  . arm surgery Left    arm  . cardiac stents  2011   Angioplasty / Stenting Femoral-X2  . COLONOSCOPY  04/01/12  . CORONARY ANGIOPLASTY    . EXCISION MELANOMA WITH SENTINEL LYMPH NODE BIOPSY Right 01/24/2016   Procedure: EXCISION MELANOMA Right Shoulder;  Surgeon: Robert Bellow, MD;  Location: [facility] ORS;  Service: General;  Laterality: Right;  . LAPAROSCOPIC NEPHRECTOMY, HAND ASSISTED Left 04/24/2016   Procedure: HAND ASSISTED LAPAROSCOPIC NEPHRECTOMY;  Surgeon: Hollice Espy, MD;  Location: [facility] ORS;  Service: Urology;  Laterality: Left;  Marland Kitchen VIDEO ASSISTED THORACOSCOPY (VATS)/THOROCOTOMY Left 03/08/2016   Procedure: VIDEO ASSISTED THORACOSCOPY (VATS) with lung biopsy - Left ;  Surgeon: Robert Bellow, MD;  Location: [facility] ORS;  Service: General;  Laterality: Left;    FAMILY HISTORY :   Family History  Problem Relation Age of Onset  . Hodgkin's lymphoma Daughter 5  . Heart attack Father   . Kidney cancer Neg Hx   . Kidney disease Neg Hx   . Prostate cancer Neg Hx     SOCIAL HISTORY:   Social History   Tobacco Use  . Smoking status: Former Smoker    Packs/day: 2.00    Years: 30.00    Pack years: 60.00    Types: Cigarettes    Start date: 01/18/1956    Last attempt to quit: 01/17/1969    Years since quitting: 49.9  . Smokeless tobacco: Never Used  Substance Use Topics  . Alcohol use: No    Alcohol/week: 0.0 standard drinks    Comment: BEER OCC  . Drug use: No    ALLERGIES:  is allergic to lisinopril and  other.  MEDICATIONS:  Current Outpatient Medications  Medication Sig Dispense Refill  . acetaminophen (TYLENOL) 500 MG tablet Take 500 mg by mouth every 6 (six) hours as needed for moderate pain.    Marland Kitchen albuterol (PROVENTIL HFA;VENTOLIN HFA) 108 (90 Base) MCG/ACT inhaler Inhale 2 puffs into the lungs every 6 (six) hours as needed for wheezing or shortness of breath. 1 Inhaler 2  . aspirin EC 81 MG tablet Take 81 mg by mouth daily.    . Aspirin-Salicylamide-Caffeine (BC HEADACHE POWDER PO) Take 1 packet by mouth daily.    Marland Kitchen atorvastatin (LIPITOR) 40 MG tablet Take 40 mg by mouth daily.    . cholecalciferol (VITAMIN D) 400 units TABS tablet Take 400 Units by mouth daily.    . clopidogrel (PLAVIX) 75 MG tablet Take 75 mg by mouth daily.    . Coenzyme Q10 (COQ10) 100 MG CAPS Take 1 capsule by mouth daily.    Marland Kitchen dicyclomine (BENTYL) 20 MG tablet Take 1 tablet (20 mg total) by mouth 3 (three) times daily before meals. 60 tablet 0  . DULoxetine (CYMBALTA) 30 MG capsule Take  2 capsules by mouth daily.    . fluticasone (FLONASE) 50 MCG/ACT nasal spray Place 2 sprays into both nostrils daily. 16 g 2  . Fluticasone-Salmeterol (ADVAIR DISKUS) 500-50 MCG/DOSE AEPB Inhale 1 puff into the lungs 2 (two) times daily. 60 each 11  . ipratropium-albuterol (DUONEB) 0.5-2.5 (3) MG/3ML SOLN Take 3 mLs by nebulization every 4 (four) hours as needed. 360 mL 3  . loratadine (CLARITIN) 10 MG tablet Take 10 mg by mouth daily.    Marland Kitchen MELATONIN PO Take 10 mg by mouth at bedtime.     . metFORMIN (GLUCOPHAGE) 500 MG tablet Take 1 tablet (500 mg total) by mouth 2 (two) times daily with a meal. 60 tablet 3  . Misc Natural Products (GLUCOSAMINE CHOND COMPLEX/MSM) TABS Take 2 tablets by mouth daily.    . montelukast (SINGULAIR) 10 MG tablet TAKE 1 TABLET BY MOUTH EVERYDAY AT BEDTIME 90 tablet 1  . Omega-3 Fatty Acids (FISH OIL) 1000 MG CPDR Take 1 capsule by mouth daily.    . OXYGEN Inhale 2 L into the lungs at bedtime.    .  tamsulosin (FLOMAX) 0.4 MG CAPS capsule TAKE 1 CAPSULE (0.4 MG TOTAL) BY MOUTH EVERY MORNING. 30 capsule 3  . Tiotropium Bromide Monohydrate (SPIRIVA RESPIMAT) 1.25 MCG/ACT AERS Inhale 1.25 Act into the lungs 2 (two) times daily. 1 Inhaler 11  . tiZANidine (ZANAFLEX) 4 MG tablet Take 4 mg by mouth daily.     . Turmeric 500 MG TABS Take 1 tablet by mouth daily.    . diphenoxylate-atropine (LOMOTIL) 2.5-0.025 MG tablet Take 2 tablets by mouth 4 (four) times daily as needed for diarrhea or loose stools. (Patient not taking: Reported on 11/03/2018) 30 tablet 0  . docusate sodium (COLACE) 100 MG capsule Take 1 capsule (100 mg total) by mouth 2 (two) times daily. (Patient not taking: Reported on 11/03/2018) 60 capsule 3  . dronabinol (MARINOL) 2.5 MG capsule Take 1 capsule (2.5 mg total) by mouth 2 (two) times daily before a meal. (Patient not taking: Reported on 11/17/2018) 60 capsule 1  . fentaNYL (DURAGESIC - DOSED MCG/HR) 25 MCG/HR patch Place 1 patch (25 mcg total) onto the skin every 3 (three) days. (Patient not taking: Reported on 12/01/2018) 10 patch 0  . ferrous sulfate 325 (65 FE) MG EC tablet Take 325 mg by mouth 3 (three) times daily with meals.    Marland Kitchen HYDROmorphone (DILAUDID) 2 MG tablet Take 1 tablet (2 mg total) by mouth every 6 (six) hours as needed for severe pain. (Patient not taking: Reported on 11/17/2018) 60 tablet 0  . HYDROmorphone (DILAUDID) 2 MG tablet Take 1 tablet (2 mg total) by mouth every 6 (six) hours as needed for severe pain. 60 tablet 0  . Lenvatinib 12 mg daily dose (LENVIMA, 12 MG DAILY DOSE,) 3 x 4 MG capsule Take 12 mg by mouth daily. 90 capsule 4  . loperamide (IMODIUM) 1 MG/5ML solution Take 4 mg by mouth as needed for diarrhea or loose stools.    . nitroGLYCERIN (NITROSTAT) 0.4 MG SL tablet Place 0.4 mg under the tongue every 5 (five) minutes as needed.     . ondansetron (ZOFRAN) 8 MG tablet Take 1 tablet (8 mg total) by mouth every 8 (eight) hours as needed for nausea  or vomiting. (Patient not taking: Reported on 11/03/2018) 60 tablet 0   No current facility-administered medications for this visit.    Facility-Administered Medications Ordered in Other Visits  Medication Dose Route Frequency Provider Last  Rate Last Dose  . 0.9 %  sodium chloride infusion   Intravenous Continuous Cammie Sickle, MD 10 mL/hr at 10/27/18 1054      PHYSICAL EXAMINATION: ECOG PERFORMANCE STATUS: 1 - Symptomatic but completely ambulatory  BP 136/87 (Patient Position: Sitting)   Pulse 99   Temp (!) 97 F (36.1 C) (Tympanic)   Resp (!) 22   SpO2 98% Comment: RA  There were no vitals filed for this visit.  Physical Exam  Constitutional: He is oriented to person, place, and time and well-developed, well-nourished, and in no distress.  Accompanied by his wife/daughter. He is walking by himself.  HENT:  Head: Normocephalic and atraumatic.  Mouth/Throat: Oropharynx is clear and moist. No oropharyngeal exudate.  Eyes: Pupils are equal, round, and reactive to light.  Neck: Normal range of motion. Neck supple.  Cardiovascular: Normal rate and regular rhythm.  Pulmonary/Chest: No respiratory distress. He has no wheezes.  Abdominal: Soft. Bowel sounds are normal. He exhibits no distension and no mass. There is no guarding.  Musculoskeletal:        General: No tenderness or edema.     Comments: Left shoulder decreased range of motion pain with passive and active movements.  Neurological: He is alert and oriented to person, place, and time.  Skin: Skin is warm.  Psychiatric: Affect normal.       LABORATORY DATA:  I have reviewed the data as listed    Component Value Date/Time   NA 140 12/01/2018 0848   NA 146 (H) 05/25/2016 1514   K 4.1 12/01/2018 0848   CL 105 12/01/2018 0848   CO2 27 12/01/2018 0848   GLUCOSE 198 (H) 12/01/2018 0848   BUN 19 12/01/2018 0848   BUN 19 05/25/2016 1514   CREATININE 1.20 12/01/2018 0848   CALCIUM 9.2 12/01/2018 0848   PROT  6.5 11/17/2018 0813   ALBUMIN 3.4 (L) 11/17/2018 0813   AST 24 11/17/2018 0813   ALT 20 11/17/2018 0813   ALKPHOS 66 11/17/2018 0813   BILITOT 0.9 11/17/2018 0813   GFRNONAA 58 (L) 12/01/2018 0848   GFRAA >60 12/01/2018 0848    No results found for: SPEP, UPEP  Lab Results  Component Value Date   WBC 4.5 12/01/2018   NEUTROABS 3.5 12/01/2018   HGB 12.5 (L) 12/01/2018   HCT 38.4 (L) 12/01/2018   MCV 90.6 12/01/2018   PLT 129 (L) 12/01/2018      Chemistry      Component Value Date/Time   NA 140 12/01/2018 0848   NA 146 (H) 05/25/2016 1514   K 4.1 12/01/2018 0848   CL 105 12/01/2018 0848   CO2 27 12/01/2018 0848   BUN 19 12/01/2018 0848   BUN 19 05/25/2016 1514   CREATININE 1.20 12/01/2018 0848      Component Value Date/Time   CALCIUM 9.2 12/01/2018 0848   ALKPHOS 66 11/17/2018 0813   AST 24 11/17/2018 0813   ALT 20 11/17/2018 0813   BILITOT 0.9 11/17/2018 0813       RADIOGRAPHIC STUDIES: I have personally reviewed the radiological images as listed and agreed with the findings in the report. No results found.   ASSESSMENT & PLAN:  Cancer of left kidney excluding renal pelvis ([place])  # Left kidney cancer metastatic to the lung- Currently status post  cycles 12 of torisel.  Fortunately Jan 9th CT- progression of lung/ bone lesions.   # Discontinue Torisel given the progression of disease. Plan start lenvima + afinitor.  Because treatments are palliative.  Discussed the potential side effects including but not limited to diarrhea hand-foot syndrome fatigue elevated blood pressure.  # After lengthy discussion weighing risk versus benefits patient agrees to proceed with treatment as discussed above.   #  Elevated blood sugars-continue metformin for now.  # Left shoulder fracture-? Path fracture/lytic lesions in bone- reviewed the CT scan- on RT [last 12/30]; stable.  See discussion below  # Left leg pain-lump noted in the left leg tibia suspicious for metastases.   Recommend skeletal survey.  If lesions noted recommend evaluation with radiation oncology.  Patient agrees.  Restart patient on Dilaudid 2 mg every 6 hours.  New prescription.  Patient noncompliant with pain medication in general.  # Labile hypertension-systolic-stable.  Monitor closely.  #Discussed regarding DNR/DNI/also palliative care referral to discuss goals of care.  Patient interested.  Follow-up in 2 weeks with palliative care.  DISPOSITION: x-rays today.  # HOLD Torisel; proceed with x-geva  # follow up in 2 weeks- MD/labs- cbc/cmp- # referral to [facility] in 2 weeks- Dr.B   Orders Placed This Encounter  Procedures  . DG Bone Survey Met    Standing Status:   Future    Number of Occurrences:   1    Standing Expiration Date:   01/31/2020    Order Specific Question:   Reason for Exam (SYMPTOM  OR DIAGNOSIS REQUIRED)    Answer:   multiple myeloma    Order Specific Question:   Preferred imaging location?    Answer:   [facility]   All questions were answered. The patient knows to call the clinic with any problems, questions or concerns.      Cammie Sickle, MD 12/01/2018 12:52 PM

## 2018-12-01 NOTE — Assessment & Plan Note (Addendum)
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  Fortunately Jan 9th CT- progression of lung/ bone lesions.   # Discontinue Torisel given the progression of disease. Plan start lenvima + afinitor.  Because treatments are palliative.  Discussed the potential side effects including but not limited to diarrhea hand-foot syndrome fatigue elevated blood pressure.  # After lengthy discussion weighing risk versus benefits patient agrees to proceed with treatment as discussed above.   #  Elevated blood sugars-continue metformin for now.  # Left shoulder fracture-? Path fracture/lytic lesions in bone- reviewed the CT scan- on RT [last 12/30]; stable.  See discussion below  # Left leg pain-lump noted in the left leg tibia suspicious for metastases.  Recommend skeletal survey.  If lesions noted recommend evaluation with radiation oncology.  Patient agrees.  Restart patient on Dilaudid 2 mg every 6 hours.  New prescription.  Patient noncompliant with pain medication in general.  Continue Xgeva.  # Labile hypertension-systolic-stable.  Monitor closely.  #Discussed regarding DNR/DNI/also palliative care referral to discuss goals of care.  Patient interested.  Follow-up in 2 weeks with palliative care  # I reviewed the blood work- with the patient in detail; also reviewed the imaging independently [as summarized above]; and with the patient in detail.    DISPOSITION: x-rays today.  # HOLD Torisel; proceed with x-geva  # follow up in 2 weeks- MD/labs- cbc/cmp- # referral to [facility] in 2 weeks- Dr.B  # 40 minutes face-to-face with the patient discussing the above plan of care; more than 50% of time spent on prognosis/ natural history; counseling and coordination.

## 2018-12-01 NOTE — Telephone Encounter (Signed)
Oral Oncology Patient Advocate Encounter  Was successful in securing patient a $10000 grant from [employer] to provide copayment coverage for [place].  This will keep the out of pocket expense at $0.     [employer] ID: [id number]  I have spoken with the patient..     The billing information is as follows and has been shared with [place].    RxBin: [id number] PCN: [id number] Member ID: [id number] Group ID: [id number] Dates of Eligibility: 11/01/18 through 11/01/2019  [place] Patient [place] Phone [phone] Fax [phone] 12/01/2018 1:08 PM

## 2018-12-01 NOTE — Telephone Encounter (Signed)
Oral Oncology Patient Advocate Encounter  Received notification from [employer] that prior authorization for Lenvima is required.  PA submitted on CoverMyMeds Key [id number]  Status is pending  Oral Oncology Clinic will continue to follow.  [place] Patient [place] Phone [phone] Fax [phone] 12/01/2018 1:07 PM

## 2018-12-01 NOTE — Progress Notes (Signed)
Per Jerene Pitch CMA per Dr. Rogue Bussing no torisel at this time, [place] See only.

## 2018-12-01 NOTE — Progress Notes (Signed)
Pt reports pain in left leg knee and calf when walking and bending leg. States that during his fall 1 month ago, he also hit his knee. Pt stopped taking all "Pain medications due to help." I inquired what the patient is taking for pain. He is no longer "taking the fentanyl patches as it 'doesn't help and not taking the dilaudid as it doesn't help." there is a visible area of hematoma on left shin. Pt has attempted on a "few occassions to ice the leg, but also stopped as this does not ease his pain."

## 2018-12-01 NOTE — Telephone Encounter (Signed)
Oral Oncology Pharmacist Encounter  Received new prescription for Lenvima (lenvatinib) for the treatment of metastatic renal cell cancer, planned duration until disease progression or unacceptable drug toxicity.  BMP from 12/01/2018 and CMP from 11/17/18 assessed, no relevant lab abnormalities. BP from 12/01/2018 well controlled, pt has had HTN on TKIs in the past continue to monitor BP. TSH from 09/03/18. Recommend checking a UA for proteinuria. Prescription dose and frequency assessed.   Current medication list in Epic reviewed, a few DDIs with lenvatinib identified: -Lenvatinib has a risk of QTc prolongation. [place] other medication ondansetron and loperamide also have the risk of QTc prolongation. Both loperamide and ondansetron are for prn use. Would recommend checking a QTc if Glen Davis begins to use either of these medications regularly.   Prescription has been e-scribed to the [facility] for benefits analysis and approval.  Patient education Counseled patient on administration, dosing, side effects, monitoring, drug-food interactions, safe handling, storage, and disposal. Patient will take 12 mg by mouth daily.  Side effects include but not limited to: HTN, hand-foot syndrome, diarrhea, fatigue, N/V.    Reviewed with patient importance of keeping a medication schedule and plan for any missed doses.  G[place] voiced understanding and appreciation. All questions answered. Medication handout provided and consent obtained.  Oral Oncology Clinic will continue to follow for insurance authorization, copayment issues, and start date.  Provided patient with [facility] [place] [facility] phone number. Patient knows to call the office with questions or concerns. Oral Chemotherapy Navigation Clinic will continue to follow.  Darl Pikes, PharmD, BCPS, [facility] Hematology/Oncology Clinical Pharmacist [facility]/AP [facility] [place] [facility] [phone]  12/01/2018 10:14 AM

## 2018-12-02 ENCOUNTER — Other Ambulatory Visit: Payer: Self-pay | Admitting: Internal Medicine

## 2018-12-02 ENCOUNTER — Telehealth: Payer: Self-pay | Admitting: Internal Medicine

## 2018-12-02 DIAGNOSIS — C7951 Secondary malignant neoplasm of bone: Secondary | ICD-10-CM

## 2018-12-02 NOTE — Telephone Encounter (Signed)
Oral Oncology Patient Advocate Encounter  Prior Authorization for [place] has been approved.    PA# [id number] Effective dates: 12/01/2018 through 12/01/2019  Patients co-pay is $3068.60.  Patient has a grant through [place] to help reduce out of pocket cost.  Oral Oncology Clinic will continue to follow.   [place] Patient [place] Phone [phone] Fax [phone] 12/02/2018 3:09 PM

## 2018-12-02 NOTE — Telephone Encounter (Signed)
Apt time for radiation for 12/04/18 at 9:30.

## 2018-12-02 NOTE — Telephone Encounter (Signed)
Heather/Brooke-please inform patient that he has multiple bone lesions from cancer as discussed previously.  Also noted to have a spot on his left leg-which can potentially be radiated.  Please inform that have reached out to Dr. Donella Stade.  Please refer the patient back to Dr. Donella Stade ASAP for left leg radiation.  Thx GB

## 2018-12-02 NOTE — Telephone Encounter (Signed)
Daughter returned my phone call. Discussed plan of care with daughter.

## 2018-12-02 NOTE — Telephone Encounter (Signed)
Spoke with patient. Bone survey Results discussed with patient and plan of care discussed with patient. consult apts given for radiation therapy 12/04/18 at 0930 am.  My chart msg sent as well. Also left vm for daughter to return my phone call.

## 2018-12-03 MED FILL — LENVIMA 12 MG DAILY DOSE 4: 3 X 4 | 30 days supply | Qty: 90 | Fill #0

## 2018-12-03 NOTE — Telephone Encounter (Signed)
Scheduled Lenvima to be delivered 12/04/2018.

## 2018-12-04 ENCOUNTER — Inpatient Hospital Stay: Payer: PPO

## 2018-12-04 ENCOUNTER — Ambulatory Visit
Admission: RE | Admit: 2018-12-04 | Discharge: 2018-12-04 | Disposition: A | Discharge status: Home / Self Care | Payer: PPO | Source: Ambulatory Visit | Attending: Radiation Oncology | Admitting: Radiation Oncology

## 2018-12-04 ENCOUNTER — Encounter: Payer: Self-pay | Admitting: Radiation Oncology

## 2018-12-04 ENCOUNTER — Inpatient Hospital Stay (HOSPITAL_BASED_OUTPATIENT_CLINIC_OR_DEPARTMENT_OTHER): Payer: PPO | Admitting: Oncology

## 2018-12-04 ENCOUNTER — Other Ambulatory Visit: Payer: Self-pay

## 2018-12-04 VITALS — BP 124/78 | HR 85 | Temp 95.9°F | Resp 18 | Wt 187.7 lb

## 2018-12-04 VITALS — BP 124/78 | HR 85 | Temp 95.9°F | Resp 18 | Wt 187.0 lb

## 2018-12-04 DIAGNOSIS — C642 Malignant neoplasm of left kidney, except renal pelvis: Secondary | ICD-10-CM

## 2018-12-04 DIAGNOSIS — Z87442 Personal history of urinary calculi: Secondary | ICD-10-CM | POA: Diagnosis not present

## 2018-12-04 DIAGNOSIS — M25512 Pain in left shoulder: Secondary | ICD-10-CM

## 2018-12-04 DIAGNOSIS — Z79899 Other long term (current) drug therapy: Secondary | ICD-10-CM

## 2018-12-04 DIAGNOSIS — R739 Hyperglycemia, unspecified: Secondary | ICD-10-CM | POA: Diagnosis not present

## 2018-12-04 DIAGNOSIS — C7951 Secondary malignant neoplasm of bone: Secondary | ICD-10-CM | POA: Insufficient documentation

## 2018-12-04 DIAGNOSIS — E079 Disorder of thyroid, unspecified: Secondary | ICD-10-CM

## 2018-12-04 DIAGNOSIS — G893 Neoplasm related pain (acute) (chronic): Secondary | ICD-10-CM | POA: Diagnosis not present

## 2018-12-04 DIAGNOSIS — C7801 Secondary malignant neoplasm of right lung: Secondary | ICD-10-CM

## 2018-12-04 DIAGNOSIS — J449 Chronic obstructive pulmonary disease, unspecified: Secondary | ICD-10-CM

## 2018-12-04 DIAGNOSIS — Z85820 Personal history of malignant melanoma of skin: Secondary | ICD-10-CM

## 2018-12-04 DIAGNOSIS — Z5111 Encounter for antineoplastic chemotherapy: Secondary | ICD-10-CM

## 2018-12-04 DIAGNOSIS — Z7982 Long term (current) use of aspirin: Secondary | ICD-10-CM

## 2018-12-04 DIAGNOSIS — I2510 Atherosclerotic heart disease of native coronary artery without angina pectoris: Secondary | ICD-10-CM

## 2018-12-04 DIAGNOSIS — I10 Essential (primary) hypertension: Secondary | ICD-10-CM

## 2018-12-04 DIAGNOSIS — Z7984 Long term (current) use of oral hypoglycemic drugs: Secondary | ICD-10-CM

## 2018-12-04 DIAGNOSIS — Z87891 Personal history of nicotine dependence: Secondary | ICD-10-CM

## 2018-12-04 MED ORDER — ACETAMINOPHEN 500 MG PO TABS: 1000.0000 mg | ORAL_TABLET | Freq: Three times a day (TID) | ORAL | 0 refills | Status: DC | PRN

## 2018-12-04 MED ORDER — DEXAMETHASONE 4 MG PO TABS: 4.0000 mg | ORAL_TABLET | Freq: Every day | ORAL | 0 refills | Status: DC

## 2018-12-04 MED ORDER — DEXAMETHASONE SODIUM PHOSPHATE 10 MG/ML IJ SOLN
10.0000 mg | Freq: Once | INTRAMUSCULAR | Status: AC
  Administered 2018-12-04: 10 mg via INTRAVENOUS
  Filled 2018-12-04: qty 1

## 2018-12-04 MED ORDER — SODIUM CHLORIDE 0.9 % IV SOLN: 10.0000 mg | Freq: Once | INTRAVENOUS | Status: DC

## 2018-12-04 MED ORDER — SODIUM CHLORIDE 0.9 % IV SOLN
Freq: Once | INTRAVENOUS | Status: AC
  Administered 2018-12-04: 11:00:00 via INTRAVENOUS
  Filled 2018-12-04: qty 250

## 2018-12-04 MED ORDER — KETOROLAC TROMETHAMINE 15 MG/ML IJ SOLN
30.0000 mg | Freq: Once | INTRAMUSCULAR | Status: AC
  Administered 2018-12-04: 30 mg via INTRAVENOUS
  Filled 2018-12-04: qty 2

## 2018-12-04 NOTE — Patient Instructions (Signed)
It was great to see you guys today.   Changes made to your medications are as follows:  You can take 4 mg Dilaudid every 4 hours for pain.   Take 3000 mg Tylenol daily divided up in 3 doses.  1000 mg every 8 hours.  Decadron 4 mg was sent to your pharmacy.  Start taking this tomorrow.  Today in clinic you were given:  10 mg Decadron and 30 mg Toradol.  There are different types of pain that may not work with just opiates/opioids. I have attached some information about these types of pain.    Living with Cancer Pain Cancer pain is different for everyone. It is important to work with your cancer care team to develop a pain management plan that is best for you. There are many options for pain control, and cancer pain can usually be managed with the right plan. Making lifestyle changes and following home care instructions from your cancer care team are also important parts of your plan. This can improve your quality of life while living with cancer pain. How to manage lifestyle changes Living with cancer pain can be stressful and exhausting. You may find that it is harder to control your pain when your stress level and exhaustion increase. Making lifestyle changes may help you reduce stress and manage cancer pain. Managing stress   Try stress reduction techniques. These may reduce stress and anxiety and take your mind off your pain. Ask your cancer care team for advice on good options. You may want to try: ? Guided relaxation. ? Meditation. ? Self-hypnosis. ? Deep breathing. ? Gentle yoga. ? Tai chi. ? Massage.  Get regular exercise. Exercise lowers stress and helps you sleep better at night. Ask your cancer care team what type of exercise is best for you.  Make sure you have a good support system of friends, family, and other care providers. Use your support system for emotional support as well as help with daily chores and activities.  Consider joining a cancer support group. Ask  your cancer care team to recommend one in your area.  Do not use drugs or alcohol to manage stress. This can make both stress and pain worse. Avoiding exhaustion  Eat a healthy diet. Work with your cancer care team to come up with a nutritious diet that is best for you.  Plan activities for times when you are most rested and your pain is best controlled. This will help you avoid physical exhaustion.  Get enough sleep. Being overtired can increase pain. Tell your cancer care team if you are having trouble sleeping. Follow these instructions at home: Medicines  Take over-the-counter and prescription medicines only as told by your health care provider.  Take your pain medicine on a regular schedule before pain becomes too severe.  If you have medicine to treat pain that happens in between doses of your usual pain control medicine (breakthrough pain), do not wait until breakthrough pain is severe before taking the designated medicine.  Do not stop or reduce your pain medicine before talking to your cancer care team.  Do not crush or break pain pills unless your health care provider says you can.  Keep a 1-week supply of pain medicine on hand. Store your medicine safely away from children and pets.  Keep a complete list of all your medicines. Activity  Return to your normal activities as told by your health care provider. Ask your health care provider what activities are safe for you.  Increase your activity level as your pain is relieved.  Do not drive or use heavy machinery while taking prescription pain medicine. Managing constipation You may need to take actions to prevent or treat constipation, such as:  Drink enough fluid to keep your urine pale yellow.  Take over-the-counter or prescription medicines.  Eat foods that are high in fiber, such as beans, whole grains, and fresh fruits and vegetables.  Limit foods that are high in fat and processed sugars, such as fried or  sweet foods. Alcohol use  Do not drink alcohol if: ? Your health care provider tells you not to drink. ? You are pregnant, may be pregnant, or are planning to become pregnant.  If you drink alcohol: ? Limit how much you use to:  0-1 drink a day for women.  0-2 drinks a day for men. ? Be aware of how much alcohol is in your drink. In the [place] one drink equals one 12 oz bottle of beer (355 mL), one 5 oz glass of wine (148 mL), or one 1 oz glass of hard liquor (44 mL). General instructions   Ask your cancer care team about keeping a pain diary. This may help your care team adjust your pain control plan as needed.  Ask to meet with a pain specialist who can help create a plan of care that works well for you.  Use gentle creams and lotions to keep your skin moist.  Do not use any products that contain nicotine or tobacco, such as cigarettes, e-cigarettes, and chewing tobacco. If you need help quitting, ask your health care provider.  Keep all follow-up visits as told by your health care provider. This is important. Where to find more information  American Cancer Society: www.cancer.[place]: www.cancer.gov Contact a health care provider if:  Your pain medicine is not controlling your pain.  You have trouble sleeping.  You feel depressed or anxious.  Your pain medicine is making you nauseous, sleepy, dizzy, or constipated.  You are unable to manage your pain at home. Summary  Cancer pain is different for everyone. Work with your cancer care team to develop the pain management plan that is best for you.  Making lifestyle changes and following home care instructions can improve your quality of life while living with cancer pain.  Lifestyle changes include eating a nutritious diet, getting regular exercise, and managing stress.  Carefully follow instructions for taking your medicine as told by your cancer care team.  Let your cancer care team know if  your pain is not controlled at home. This information is not intended to replace advice given to you by your health care provider. Make sure you discuss any questions you have with your health care provider. Document Released: 07/10/2018 Document Revised: 07/10/2018 Document Reviewed: 07/10/2018 Elsevier Interactive Patient Education  2019 [employer].  Neuropathic Pain Neuropathic pain is pain caused by damage to the nerves that are responsible for certain sensations in your body (sensory nerves). The pain can be caused by:  Damage to the sensory nerves that send signals to your spinal cord and brain (peripheral nervous system).  Damage to the sensory nerves in your brain or spinal cord (central nervous system). Neuropathic pain can make you more sensitive to pain. Even a minor sensation can feel very painful. This is usually a long-term condition that can be difficult to treat. The type of pain differs from person to person. It may:  Start suddenly (acute), or it may  develop slowly and last for a long time (chronic).  Come and go as damaged nerves heal, or it may stay at the same level for years.  Cause emotional distress, loss of sleep, and a lower quality of life. What are the causes? The most common cause of this condition is diabetes. Many other diseases and conditions can also cause neuropathic pain. Causes of neuropathic pain can be classified as:  Toxic. This is caused by medicines and chemicals. The most common cause of toxic neuropathic pain is damage from cancer treatments (chemotherapy).  Metabolic. This can be caused by: ? Diabetes. This is the most common disease that damages the nerves. ? Lack of vitamin B from long-term alcohol abuse.  Traumatic. Any injury that cuts, crushes, or stretches a nerve can cause damage and pain. A common example is feeling pain after losing an arm or leg (phantom limb pain).  Compression-related. If a sensory nerve gets trapped or  compressed for a long period of time, the blood supply to the nerve can be cut off.  Vascular. Many blood vessel diseases can cause neuropathic pain by decreasing blood supply and oxygen to nerves.  Autoimmune. This type of pain results from diseases in which the body's defense system (immune system) mistakenly attacks sensory nerves. Examples of autoimmune diseases that can cause neuropathic pain include lupus and multiple sclerosis.  Infectious. Many types of viral infections can damage sensory nerves and cause pain. Shingles infection is a common cause of this type of pain.  Inherited. Neuropathic pain can be a symptom of many diseases that are passed down through families (genetic). What increases the risk? You are more likely to develop this condition if:  You have diabetes.  You smoke.  You drink too much alcohol.  You are taking certain medicines, including medicines that kill cancer cells (chemotherapy) or that treat immune system disorders. What are the signs or symptoms? The main symptom is pain. Neuropathic pain is often described as:  Burning.  Shock-like.  Stinging.  Hot or cold.  Itching. How is this diagnosed? No single test can diagnose neuropathic pain. It is diagnosed based on:  Physical exam and your symptoms. Your health care provider will ask you about your pain. You may be asked to use a pain scale to describe how bad your pain is.  Tests. These may be done to see if you have a high sensitivity to pain and to help find the cause and location of any sensory nerve damage. They include: ? Nerve conduction studies to test how well nerve signals travel through your sensory nerves (electrodiagnostic testing). ? Stimulating your sensory nerves through electrodes on your skin and measuring the response in your spinal cord and brain (somatosensory evoked potential).  Imaging studies, such as: ? X-rays. ? CT scan. ? MRI. How is this treated? Treatment for  neuropathic pain may change over time. You may need to try different treatment options or a combination of treatments. Some options include:  Treating the underlying cause of the neuropathy, such as diabetes, kidney disease, or vitamin deficiencies.  Stopping medicines that can cause neuropathy, such as chemotherapy.  Medicine to relieve pain. Medicines may include: ? Prescription or over-the-counter pain medicine. ? Anti-seizure medicine. ? Antidepressant medicines. ? Pain-relieving patches that are applied to painful areas of skin. ? A medicine to numb the area (local anesthetic), which can be injected as a nerve block.  Transcutaneous nerve stimulation. This uses electrical currents to block painful nerve signals. The treatment is  painless.  Alternative treatments, such as: ? Acupuncture. ? Meditation. ? Massage. ? Physical therapy. ? Pain management programs. ? Counseling. Follow these instructions at home: Medicines   Take over-the-counter and prescription medicines only as told by your health care provider.  Do not drive or use heavy machinery while taking prescription pain medicine.  If you are taking prescription pain medicine, take actions to prevent or treat constipation. Your health care provider may recommend that you: ? Drink enough fluid to keep your urine pale yellow. ? Eat foods that are high in fiber, such as fresh fruits and vegetables, whole grains, and beans. ? Limit foods that are high in fat and processed sugars, such as fried or sweet foods. ? Take an over-the-counter or prescription medicine for constipation. Lifestyle   Have a good support system at home.  Consider joining a chronic pain support group.  Do not use any products that contain nicotine or tobacco, such as cigarettes and e-cigarettes. If you need help quitting, ask your health care provider.  Do not drink alcohol. General instructions  Learn as much as you can about your  condition.  Work closely with all your health care providers to find the treatment plan that works best for you.  Ask your health care provider what activities are safe for you.  Keep all follow-up visits as told by your health care provider. This is important. Contact a health care provider if:  Your pain treatments are not working.  You are having side effects from your medicines.  You are struggling with tiredness (fatigue), mood changes, depression, or anxiety. Summary  Neuropathic pain is pain caused by damage to the nerves that are responsible for certain sensations in your body (sensory nerves).  Neuropathic pain may come and go as damaged nerves heal, or it may stay at the same level for years.  Neuropathic pain is usually a long-term condition that can be difficult to treat. Consider joining a chronic pain support group. This information is not intended to replace advice given to you by your health care provider. Make sure you discuss any questions you have with your health care provider. Document Released: 08/02/2004 Document Revised: 11/22/2017 Document Reviewed: 11/22/2017 Elsevier Interactive Patient Education  2019 [employer].

## 2018-12-04 NOTE — Progress Notes (Signed)
Symptom Management Consult note [facility]  Telephone:([phone] [phone]  Patient Care Team: Dion Body, MD as PCP - General (Family Medicine) Jannet Mantis, MD (Dermatology) Bary Castilla, Forest Gleason, MD (General Surgery) Hollice Espy, MD as Consulting Physician (Urology) Cammie Sickle, MD as Medical Oncologist (Medical Oncology) Isaias Cowman, MD as Consulting Physician (Cardiology) Harriette Bouillon, MD as Referring Physician (Cardiology)   Name of the patient: Glen Davis  [id number]  02/08/1941   Date of visit: 12/04/2018  Diagnosis: Clear cell renal cancer   Chief Complaint: left hip pain  Current Treatment: s/p Cycle 11 Torisel . Last given 11/24/2018  Oncology History: Patient last seen by primary oncologist Dr. Rogue Bussing on 12/01/2018 for follow-up and results from CT scan.  Noted progressively worsening pain in his left mid leg.  Pain in his shoulder had improved.  Complained of chronic mild back pain.  Maintained an okay appetite.  Denied any nausea or vomiting.  Denied falls.  CT scan revealed progression of lung/bone lesions.  Torisel was discontinued d/t progression of disease.  Plan was to initiate Lenvima plus Afinitor for palliative treatment.  Skeletal survey was recommended and if lesion noted to be metastatic disease to left leg then evaluation by radiation oncology would be appropriate given worsening pain.  He was restarted on Dilaudid 2 mg every 6 hours.  Skeletal bone survey completed on 12/01/2018 revealing lytic lesions to left scapula, left iliac bone, left tibia and left fibula.   Scheduled to be evaluated by Dr. Donella Stade at 9:30 AM for palliative radiation therapy to his left tib-fib region. Will start treatment next week.  Oncology History   # MAY- June 2017- METASTATIC CLEAR CELL; LEFT RENAL CA/STAGE IV; [facility]- G-3; INTERMEDIATE RISK;  [bil Pul lung nodules;incidental s/p Bx  Dr.Byrnett/Dr.Oaks] s/p Cytoreductive nephrectomy; Dr.Brandon- [id number]; July 11th 2017 CT- Enlarging Lung nodules  # Aug 7th 2017- PAZOPANIB; STOP SEP 2017 [pancreatitis/poor tol]  # OCT 1 week- START NIVO q 2W x4; DEC 8th CT- "Progression"; Continue Opdivo;APRIL-MAY 2018- STABLE LUNG NODULES [stopped sec to severe cough; NO Pneumonitis]  # May 31st 2018- Cabo 26m/day; July 31st CT lung- improved lung nodules; Sep 1st week- cabo- 469mday [dose reduced sec to mult intol]; Oct 1st 2week-Cabo 2013may;   # NOV 15th 2018- CT chest- slight progression of lung nodules [poor tol to higer doses];   # Nov 28th 2018- SUTENT 50 mg 2w-on  &1 w-OFF;  # MARCH 2019- Progression; start Axitinib 5 mg BID; July 2019- MIXED response; worsening/new skeletal lesions/stable to improved lung lesions; STOP Axitinib;   # Left hip s/p RT [mid-AUG 2019]  # AUG 12th- RE-START CABO 40 mg/day; STOP SEP 2019- sec to intol [abdominal pain even on 93m44my]; [facility] IV q W x 12 treatments; May 28, 2019-progression of the lung nodules bone lesions.  # Jan 2020- 13th-Lenvima 12 mg.  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 male who presents for worsening left shin pain. Onset was gradual, starting about 1 month ago. Inciting event: none known. Current symptoms include:  pain located left tib-fib region. Pain is aggravated by any weight bearing. Patient has had no prior leg problems. Evaluation to date: plain films: abnormal revealing lytic leisons to tib-fib. Treatment to date: avoidance of offending activity, ice, OTC analgesics which are ineffective, rest and opioids and opiates.  The following portions of the patient's history were reviewed and updated as appropriate: allergies, current medications, past family history, past medical history, past social history, past surgical history and problem list.   Review of Systems A comprehensive review of systems was negative except for: Musculoskeletal: positive for bone pain   Objective:    BP 124/78 (Patient Position: Sitting)   Pulse 85   Temp (!) 95.9 F (35.5 C) (Tympanic)   Resp 18   Wt 187 lb (84.8 kg)   BMI 24.67 kg/m  Right leg: normal and no effusion, full active range of motion, no joint line tenderness, ligamentous structures intact.  Left leg:  left tibia/fibula pain with active and passive ROM   Bone Survey on 12/01/18 revealing Lytic lesions to the left scapula, left iliac bone, left tibia and left fibula. Lucency seen in posterior skull which may represent benign venous [place], but lytic lesion can not be excluded. Rounded sclerotic lesion seen above the left acetabulum which could represent metastatic disease.   Assessment:    Left leg pain d/t metastastic bone leison   Plan:    Left kidney cancer with mets to the lung and bone: S/p 12 cycles of Torisel.  Unfortunately scans from 11/27/2018 revealed progression of lung and new bone lesions.  Plan is to discontinue Torisel given progression of disease and to start palliative Lenvima plus Afinitor.   Left leg pain: Recent bone survey revealed lytic lesions to left scapula, left iliac bone, left tibia and left fibula.  Pain d/t  lytic lesions.  Currently not taking any pain medication because " it does not work".  On assessment, obvious range  of motion deficit to left leg.   Plan:  Increase Dilaudid to 4 mg every 4 hours. RX dexamethasone 4 mg daily for 7 to 10 days until beginning radiation.  RX scheduled Tylenol 3 times a day. (1000 mg q 8 hours).  Will call patient tomorrow to see if new regimen is helping. Give 30 mg Toradol while in clinic. Give 10 mg dexamethasone while in clinic. Monitor blood sugars while on steroids.  Currently controlled with metformin twice daily dosing.  Scheduled to begin radiation therapy next week.   Greater than 50% was spent in counseling and coordination of care with this patient including but not limited to discussion of the relevant topics above (See A&P) including, but not limited to diagnosis and management of acute and chronic medical conditions.   Faythe Casa, NP 12/05/2018 2:59 PM

## 2018-12-04 NOTE — Progress Notes (Signed)
Radiation Oncology Follow up Note Old patient new area left tib-fib metastatic lesions  Name: Glen Davis   Date:   12/04/2018 MRN:  [id number] DOB: 1941/08/12    This 78 y.o. male presents to the clinic today for reevaluation of renal cell carcinoma stage IV metastatic disease to his left tib-fib.  REFERRING PROVIDER: Dion Body, MD  HPI: patient is a 78 year old male well known to our department.having been treated to multiple sites for stage IV renal cell carcinoma. He's received radium therapy to his lumbar spine left iliac wing and left shoulder all areas having achieved excellent palliation. He recently had bone survey showing metastatic disease in his left tib-fib region and he has significant pain both on ambulation and at rest in that region. He is ambulating without assistance.he has recently startedstart lenvima + afinitor.  COMPLICATIONS OF TREATMENT: none  FOLLOW UP COMPLIANCE: keeps appointments   PHYSICAL EXAM:  BP 124/78 (BP Location: Left Arm, Patient Position: Sitting)   Pulse 85   Temp (!) 95.9 F (35.5 C) (Tympanic)   Resp 18   Wt 187 lb 11.6 oz (85.2 kg)   BMI 24.77 kg/m  Range of motion of his lower extremities does not elicit pain motor sensory and DTR levels are equal and symmetric in lower extremities deep palpation of his mid shaft of his left tibia does elicit pain.Well-developed well-nourished patient in NAD. HEENT reveals PERLA, EOMI, discs not visualized.  Oral cavity is clear. No oral mucosal lesions are identified. Neck is clear without evidence of cervical or supraclavicular adenopathy. Lungs are clear to A&P. Cardiac examination is essentially unremarkable with regular rate and rhythm without murmur rub or thrill. Abdomen is benign with no organomegaly or masses noted. Motor sensory and DTR levels are equal and symmetric in the upper and lower extremities. Cranial nerves II through XII are grossly intact. Proprioception is intact. No peripheral  adenopathy or edema is identified. No motor or sensory levels are noted. Crude visual fields are within normal range.  RADIOLOGY RESULTS: skeletal survey reviewed compatible above-stated findings  PLAN: at this time I have recommended palliative radiation therapy to his left tib-fib region. Would plan on delivering 3000 cGy in 10 fractions. Risks and benefits of treatment including skin reaction fatigue alteration of blood counts all were described in detail to the patient. He seems to comprehend my treatment plan well. I have personally set up and ordered CT simulation.  I would like to take this opportunity to thank you for allowing me to participate in the care of your patient.Noreene Filbert, MD

## 2018-12-05 ENCOUNTER — Ambulatory Visit
Admission: RE | Admit: 2018-12-05 | Discharge: 2018-12-05 | Disposition: A | Discharge status: Home / Self Care | Payer: PPO | Source: Ambulatory Visit | Attending: Radiation Oncology | Admitting: Radiation Oncology

## 2018-12-05 DIAGNOSIS — Z510 Encounter for antineoplastic radiation therapy: Secondary | ICD-10-CM | POA: Diagnosis not present

## 2018-12-05 DIAGNOSIS — C649 Malignant neoplasm of unspecified kidney, except renal pelvis: Secondary | ICD-10-CM | POA: Diagnosis not present

## 2018-12-05 DIAGNOSIS — C642 Malignant neoplasm of left kidney, except renal pelvis: Secondary | ICD-10-CM | POA: Diagnosis not present

## 2018-12-05 DIAGNOSIS — C7951 Secondary malignant neoplasm of bone: Secondary | ICD-10-CM | POA: Diagnosis not present

## 2018-12-10 ENCOUNTER — Ambulatory Visit
Admission: RE | Admit: 2018-12-10 | Discharge: 2018-12-10 | Disposition: A | Discharge status: Home / Self Care | Payer: PPO | Source: Ambulatory Visit | Attending: Radiation Oncology | Admitting: Radiation Oncology

## 2018-12-10 DIAGNOSIS — C642 Malignant neoplasm of left kidney, except renal pelvis: Secondary | ICD-10-CM | POA: Diagnosis not present

## 2018-12-10 DIAGNOSIS — C7951 Secondary malignant neoplasm of bone: Secondary | ICD-10-CM | POA: Diagnosis not present

## 2018-12-10 DIAGNOSIS — Z510 Encounter for antineoplastic radiation therapy: Secondary | ICD-10-CM | POA: Diagnosis not present

## 2018-12-11 ENCOUNTER — Ambulatory Visit
Admission: RE | Admit: 2018-12-11 | Discharge: 2018-12-11 | Disposition: A | Discharge status: Home / Self Care | Payer: PPO | Source: Ambulatory Visit | Attending: Radiation Oncology | Admitting: Radiation Oncology

## 2018-12-11 DIAGNOSIS — Z510 Encounter for antineoplastic radiation therapy: Secondary | ICD-10-CM | POA: Diagnosis not present

## 2018-12-12 ENCOUNTER — Ambulatory Visit
Admission: RE | Admit: 2018-12-12 | Discharge: 2018-12-12 | Disposition: A | Discharge status: Home / Self Care | Payer: PPO | Source: Ambulatory Visit | Attending: Radiation Oncology | Admitting: Radiation Oncology

## 2018-12-12 DIAGNOSIS — Z510 Encounter for antineoplastic radiation therapy: Secondary | ICD-10-CM | POA: Diagnosis not present

## 2018-12-15 ENCOUNTER — Ambulatory Visit: Payer: PPO | Admitting: Radiation Oncology

## 2018-12-15 ENCOUNTER — Ambulatory Visit
Admission: RE | Admit: 2018-12-15 | Discharge: 2018-12-15 | Disposition: A | Discharge status: Home / Self Care | Payer: PPO | Source: Ambulatory Visit | Attending: Radiation Oncology | Admitting: Radiation Oncology

## 2018-12-15 DIAGNOSIS — Z510 Encounter for antineoplastic radiation therapy: Secondary | ICD-10-CM | POA: Diagnosis not present

## 2018-12-16 ENCOUNTER — Ambulatory Visit
Admission: RE | Admit: 2018-12-16 | Discharge: 2018-12-16 | Disposition: A | Discharge status: Home / Self Care | Payer: PPO | Source: Ambulatory Visit | Attending: Radiation Oncology | Admitting: Radiation Oncology

## 2018-12-16 ENCOUNTER — Other Ambulatory Visit: Payer: Self-pay | Admitting: Oncology

## 2018-12-16 DIAGNOSIS — Z510 Encounter for antineoplastic radiation therapy: Secondary | ICD-10-CM | POA: Diagnosis not present

## 2018-12-17 ENCOUNTER — Inpatient Hospital Stay (HOSPITAL_BASED_OUTPATIENT_CLINIC_OR_DEPARTMENT_OTHER): Payer: PPO | Admitting: Hospice and Palliative Medicine

## 2018-12-17 ENCOUNTER — Inpatient Hospital Stay (HOSPITAL_BASED_OUTPATIENT_CLINIC_OR_DEPARTMENT_OTHER): Payer: PPO | Admitting: Internal Medicine

## 2018-12-17 ENCOUNTER — Inpatient Hospital Stay: Payer: PPO

## 2018-12-17 ENCOUNTER — Ambulatory Visit
Admission: RE | Admit: 2018-12-17 | Discharge: 2018-12-17 | Disposition: A | Discharge status: Home / Self Care | Payer: PPO | Source: Ambulatory Visit | Attending: Radiation Oncology | Admitting: Radiation Oncology

## 2018-12-17 ENCOUNTER — Other Ambulatory Visit: Payer: Self-pay

## 2018-12-17 VITALS — BP 130/90 | HR 86 | Temp 97.6°F | Resp 20 | Ht 73.0 in | Wt 186.8 lb

## 2018-12-17 DIAGNOSIS — Z87442 Personal history of urinary calculi: Secondary | ICD-10-CM | POA: Diagnosis not present

## 2018-12-17 DIAGNOSIS — I2510 Atherosclerotic heart disease of native coronary artery without angina pectoris: Secondary | ICD-10-CM

## 2018-12-17 DIAGNOSIS — Z87891 Personal history of nicotine dependence: Secondary | ICD-10-CM

## 2018-12-17 DIAGNOSIS — Z510 Encounter for antineoplastic radiation therapy: Secondary | ICD-10-CM | POA: Diagnosis not present

## 2018-12-17 DIAGNOSIS — R739 Hyperglycemia, unspecified: Secondary | ICD-10-CM

## 2018-12-17 DIAGNOSIS — Z7982 Long term (current) use of aspirin: Secondary | ICD-10-CM

## 2018-12-17 DIAGNOSIS — Z79899 Other long term (current) drug therapy: Secondary | ICD-10-CM

## 2018-12-17 DIAGNOSIS — M25512 Pain in left shoulder: Secondary | ICD-10-CM

## 2018-12-17 DIAGNOSIS — Z5111 Encounter for antineoplastic chemotherapy: Secondary | ICD-10-CM

## 2018-12-17 DIAGNOSIS — Z7984 Long term (current) use of oral hypoglycemic drugs: Secondary | ICD-10-CM

## 2018-12-17 DIAGNOSIS — Z85820 Personal history of malignant melanoma of skin: Secondary | ICD-10-CM

## 2018-12-17 DIAGNOSIS — J449 Chronic obstructive pulmonary disease, unspecified: Secondary | ICD-10-CM | POA: Diagnosis not present

## 2018-12-17 DIAGNOSIS — E079 Disorder of thyroid, unspecified: Secondary | ICD-10-CM

## 2018-12-17 DIAGNOSIS — C642 Malignant neoplasm of left kidney, except renal pelvis: Secondary | ICD-10-CM

## 2018-12-17 DIAGNOSIS — C7801 Secondary malignant neoplasm of right lung: Secondary | ICD-10-CM

## 2018-12-17 DIAGNOSIS — G893 Neoplasm related pain (acute) (chronic): Secondary | ICD-10-CM | POA: Diagnosis not present

## 2018-12-17 DIAGNOSIS — G4700 Insomnia, unspecified: Secondary | ICD-10-CM

## 2018-12-17 DIAGNOSIS — C7951 Secondary malignant neoplasm of bone: Secondary | ICD-10-CM | POA: Diagnosis not present

## 2018-12-17 DIAGNOSIS — I10 Essential (primary) hypertension: Secondary | ICD-10-CM

## 2018-12-17 DIAGNOSIS — Z515 Encounter for palliative care: Secondary | ICD-10-CM

## 2018-12-17 DIAGNOSIS — Z7189 Other specified counseling: Secondary | ICD-10-CM

## 2018-12-17 LAB — COMPREHENSIVE METABOLIC PANEL
ALT: 25 U/L (ref 0–44)
AST: 20 U/L (ref 15–41)
Albumin: 3.8 g/dL (ref 3.5–5.0)
Alkaline Phosphatase: 87 U/L (ref 38–126)
Anion gap: 10 (ref 5–15)
BUN: 22 mg/dL (ref 8–23)
CO2: 28 mmol/L (ref 22–32)
Calcium: 9 mg/dL (ref 8.9–10.3)
Chloride: 99 mmol/L (ref 98–111)
Creatinine, Ser: 1.17 mg/dL (ref 0.61–1.24)
GFR calc Af Amer: >60 mL/min (ref >60)
GFR calc non Af Amer: 60 mL/min — ABNORMAL LOW (ref >60)
Glucose, Bld: 210 mg/dL — ABNORMAL HIGH (ref 70–99)
Potassium: 4.6 mmol/L (ref 3.5–5.1)
Sodium: 137 mmol/L (ref 135–145)
Total Bilirubin: 0.8 mg/dL (ref 0.3–1.2)
Total Protein: 6.7 g/dL (ref 6.5–8.1)

## 2018-12-17 LAB — CBC WITH DIFFERENTIAL/PLATELET
Abs Immature Granulocytes: 0.26 10*3/uL — ABNORMAL HIGH (ref 0.00–0.07)
BASOS ABS: 0 10*3/uL (ref 0.0–0.1)
Basophils Relative: 0 %
Eosinophils Absolute: 0 10*3/uL (ref 0.0–0.5)
Eosinophils Relative: 0 %
HCT: 43.6 % (ref 39.0–52.0)
Hemoglobin: 14.6 g/dL (ref 13.0–17.0)
Immature Granulocytes: 2 %
LYMPHS PCT: 4 %
Lymphs Abs: 0.6 10*3/uL — ABNORMAL LOW (ref 0.7–4.0)
MCH: 29.6 pg (ref 26.0–34.0)
MCHC: 33.5 g/dL (ref 30.0–36.0)
MCV: 88.4 fL (ref 80.0–100.0)
Monocytes Absolute: 0.6 10*3/uL (ref 0.1–1.0)
Monocytes Relative: 4 %
Neutro Abs: 14 10*3/uL — ABNORMAL HIGH (ref 1.7–7.7)
Neutrophils Relative %: 90 %
Platelets: 175 10*3/uL (ref 150–400)
RBC: 4.93 MIL/uL (ref 4.22–5.81)
RDW: 17.3 % — ABNORMAL HIGH (ref 11.5–15.5)
WBC: 15.5 10*3/uL — ABNORMAL HIGH (ref 4.0–10.5)
nRBC: 0 % (ref 0.0–0.2)

## 2018-12-17 MED ORDER — TEMAZEPAM 7.5 MG PO CAPS: 7.5000 mg | ORAL_CAPSULE | Freq: Every evening | ORAL | 0 refills | Status: DC | PRN

## 2018-12-17 NOTE — Progress Notes (Signed)
[facility] OFF[facility] PROGRESS NOTE  Patient Care Team: Dion Body, MD as PCP - General (Family Medicine) Jannet Mantis, MD (Dermatology) Bary Castilla, Forest Gleason, MD (General Surgery) Hollice Espy, MD as Consulting Physician (Urology) Cammie Sickle, MD as Medical Oncologist (Medical Oncology) Isaias Cowman, MD as Consulting Physician (Cardiology) Harriette Bouillon, MD as Referring Physician (Cardiology)  Cancer Staging No matching staging information was found for the patient.   Oncology History   # MAY- June 2017- METASTATIC CLEAR CELL; LEFT RENAL CA/STAGE IV; [place]- G-3; INTERMEDIATE RISK;  [bil Pul lung nodules;incidental s/p Bx Dr.Byrnett/Dr.Oaks] s/p Cytoreductive nephrectomy; Dr.Brandon- [id number]; July 11th 2017 CT- Enlarging Lung nodules  # Aug 7th 2017- PAZOPANIB; STOP SEP 2017 [pancreatitis/poor tol]  # OCT 1 week- START NIVO q 2W x4; DEC 8th CT- "Progression"; Continue Opdivo;APRIL-MAY 2018- STABLE LUNG NODULES [stopped sec to severe cough; NO Pneumonitis]  # May 31st 2018- Cabo 62m/day; July 31st CT lung- improved lung nodules; Sep 1st week- cabo- 450mday [dose reduced sec to mult intol]; Oct 1st 2week-Cabo 2042may;   # NOV 15th 2018- CT chest- slight progression of lung nodules [poor tol to higer doses];   # Nov 28th 2018- SUTENT 50 mg 2w-on  &1 w-OFF;  # MARCH 2019- Progression; start Axitinib 5 mg BID; July 2019- MIXED response; worsening/new skeletal lesions/stable to improved lung lesions; STOP Axitinib;   # Left hip s/p RT [mid-AUG 2019]  # AUG 12th- RE-START CABO 40 mg/day; STOP SEP 2019- sec to intol [abdominal pain even on 19m3my]; [facility] IV q W x 12 treatments; May 28, 2019-progression of the lung nodules bone lesions.  # Jan 2020- 13th-Lenvima 12 mg.  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];STAGE I  0.42 millimeter  depth; s/p WLE [Dr.Byrnett]  ---------------------------------------------------------    # March 2019- Molecular testing- PDL-1 NEG; TMB-Low; MSI-STABLE; No targets; VHL mutation of  unknown significance  -----------------------------------------------------   # Dx; Kidney cancer/ clear cell STAGE IV  Current treatment: Lenvima 12mg37m [jan 13th 2020]  Goal: Palliative     Cancer of left kidney excluding renal pelvis (HCC)[facility]  INTERVAL HISTORY:  [place].39  male pleasant patient above history of metastatic renal cell cancer currently on lenvatinib is here for follow-up.  In the interim patient has been started on radiation for his left leg pain.  Pain is not better controlled.  He is not taking any narcotic pain medications.  He is taking NSAIDs as needed.  No new shortness of breath or cough.  Mild to moderate fatigue.  No falls.  Review of Systems  Constitutional: Positive for malaise/fatigue. Negative for chills, diaphoresis and fever.  HENT: Negative for nosebleeds and sore throat.   Eyes: Negative for double vision.  Respiratory: Negative for cough, hemoptysis, sputum production, shortness of breath and wheezing.   Cardiovascular: Negative for chest pain, palpitations, orthopnea and leg swelling.  Gastrointestinal: Negative for blood in stool, constipation, heartburn, melena and vomiting.  Genitourinary: Negative for dysuria, frequency and urgency.  Musculoskeletal: Positive for back pain and joint pain (left shoulder pain).       Approximate 2 cm nodule noted on the left mid tibia.  Skin: Negative.  Negative for itching and rash.  Neurological: Negative for tingling, focal weakness and weakness.  Endo/Heme/Allergies: Does not bruise/bleed easily.  Psychiatric/Behavioral: Negative for depression. The patient has insomnia. The patient  is not nervous/anxious.       PAST MEDICAL HISTORY :  Past Medical History:  Diagnosis Date  . CAD (coronary artery  disease)   . Cancer ([place])    left arm  . COPD (chronic obstructive pulmonary disease) ([place])   . Hypertension   . Kidney stone   . Lung cancer ([place])   . Melanoma ([place]) 01/24/2016   right shoulder  . Thyroid nodule 02/02/2016   left BENIGN THYROID NODULE by FNA    PAST SURGICAL HISTORY :   Past Surgical History:  Procedure Laterality Date  . arm surgery Left    arm  . cardiac stents  2011   Angioplasty / Stenting Femoral-X2  . COLONOSCOPY  04/01/12  . CORONARY ANGIOPLASTY    . EXCISION MELANOMA WITH SENTINEL LYMPH NODE BIOPSY Right 01/24/2016   Procedure: EXCISION MELANOMA Right Shoulder;  Surgeon: Robert Bellow, MD;  Location: [facility] ORS;  Service: General;  Laterality: Right;  . LAPAROSCOPIC NEPHRECTOMY, HAND ASSISTED Left 04/24/2016   Procedure: HAND ASSISTED LAPAROSCOPIC NEPHRECTOMY;  Surgeon: Hollice Espy, MD;  Location: [facility] ORS;  Service: Urology;  Laterality: Left;  Marland Kitchen VIDEO ASSISTED THORACOSCOPY (VATS)/THOROCOTOMY Left 03/08/2016   Procedure: VIDEO ASSISTED THORACOSCOPY (VATS) with lung biopsy - Left ;  Surgeon: Robert Bellow, MD;  Location: [facility] ORS;  Service: General;  Laterality: Left;    FAMILY HISTORY :   Family History  Problem Relation Age of Onset  . Hodgkin's lymphoma Daughter 5  . Heart attack Father   . Kidney cancer Neg Hx   . Kidney disease Neg Hx   . Prostate cancer Neg Hx     SOCIAL HISTORY:   Social History   Tobacco Use  . Smoking status: Former Smoker    Packs/day: 2.00    Years: 30.00    Pack years: 60.00    Types: Cigarettes    Start date: 01/18/1956    Last attempt to quit: 01/17/1969    Years since quitting: 49.9  . Smokeless tobacco: Never Used  Substance Use Topics  . Alcohol use: No    Alcohol/week: 0.0 standard drinks    Comment: BEER OCC  . Drug use: No    ALLERGIES:  is allergic to lisinopril and other.  MEDICATIONS:  Current Outpatient Medications  Medication Sig Dispense Refill  . albuterol (PROVENTIL HFA;VENTOLIN HFA)  108 (90 Base) MCG/ACT inhaler Inhale 2 puffs into the lungs every 6 (six) hours as needed for wheezing or shortness of breath. 1 Inhaler 2  . aspirin EC 81 MG tablet Take 81 mg by mouth daily.    . Aspirin-Salicylamide-Caffeine (BC HEADACHE POWDER PO) Take 1 packet by mouth daily.    Marland Kitchen atorvastatin (LIPITOR) 40 MG tablet Take 40 mg by mouth daily.    . cholecalciferol (VITAMIN D) 400 units TABS tablet Take 400 Units by mouth daily.    . clopidogrel (PLAVIX) 75 MG tablet Take 75 mg by mouth daily.    . Coenzyme Q10 (COQ10) 100 MG CAPS Take 1 capsule by mouth daily.    . DULoxetine (CYMBALTA) 30 MG capsule Take 2 capsules by mouth daily.    . fluticasone (FLONASE) 50 MCG/ACT nasal spray Place 2 sprays into both nostrils daily. 16 g 2  . Fluticasone-Salmeterol (ADVAIR DISKUS) 500-50 MCG/DOSE AEPB Inhale 1 puff into the lungs 2 (two) times daily. 60 each 11  . ipratropium-albuterol (DUONEB) 0.5-2.5 (3) MG/3ML SOLN Take 3 mLs by nebulization every 4 (four) hours as needed. 360 mL 3  .  Lenvatinib 12 mg daily dose (LENVIMA, 12 MG DAILY DOSE,) 3 x 4 MG capsule Take 12 mg by mouth daily. 90 capsule 4  . loratadine (CLARITIN) 10 MG tablet Take 10 mg by mouth daily.    Marland Kitchen MELATONIN PO Take 10 mg by mouth at bedtime.     . metFORMIN (GLUCOPHAGE) 500 MG tablet Take 1 tablet (500 mg total) by mouth 2 (two) times daily with a meal. 60 tablet 3  . Misc Natural Products (GLUCOSAMINE CHOND COMPLEX/MSM) TABS Take 2 tablets by mouth daily.    . montelukast (SINGULAIR) 10 MG tablet TAKE 1 TABLET BY MOUTH EVERYDAY AT BEDTIME 90 tablet 1  . Omega-3 Fatty Acids (FISH OIL) 1000 MG CPDR Take 1 capsule by mouth daily.    . OXYGEN Inhale 2 L into the lungs at bedtime.    . tamsulosin (FLOMAX) 0.4 MG CAPS capsule TAKE 1 CAPSULE (0.4 MG TOTAL) BY MOUTH EVERY MORNING. 30 capsule 3  . Tiotropium Bromide Monohydrate (SPIRIVA RESPIMAT) 1.25 MCG/ACT AERS Inhale 1.25 Act into the lungs 2 (two) times daily. 1 Inhaler 11  .  tiZANidine (ZANAFLEX) 4 MG tablet Take 4 mg by mouth daily.     . Turmeric 500 MG TABS Take 1 tablet by mouth daily.    Marland Kitchen loperamide (IMODIUM) 1 MG/5ML solution Take 4 mg by mouth as needed for diarrhea or loose stools.    . nitroGLYCERIN (NITROSTAT) 0.4 MG SL tablet Place 0.4 mg under the tongue every 5 (five) minutes as needed.     . ondansetron (ZOFRAN) 8 MG tablet Take 1 tablet (8 mg total) by mouth every 8 (eight) hours as needed for nausea or vomiting. (Patient not taking: Reported on 11/03/2018) 60 tablet 0   No current facility-administered medications for this visit.    Facility-Administered Medications Ordered in Other Visits  Medication Dose Route Frequency Provider Last Rate Last Dose  . 0.9 %  sodium chloride infusion   Intravenous Continuous Cammie Sickle, MD 10 mL/hr at 10/27/18 1054      PHYSICAL EXAMINATION: ECOG PERFORMANCE STATUS: 1 - Symptomatic but completely ambulatory  BP 130/90 (Patient Position: Sitting)   Pulse 86   Temp 97.6 F (36.4 C) (Tympanic)   Resp 20   Ht '6\' 1"'  (1.854 m)   Wt 186 lb 12.8 oz (84.7 kg)   BMI 24.65 kg/m   Filed Weights   12/17/18 0953  Weight: 186 lb 12.8 oz (84.7 kg)    Physical Exam  Constitutional: He is oriented to person, place, and time and well-developed, well-nourished, and in no distress.  Accompanied by his daughter. He is walking by himself.  HENT:  Head: Normocephalic and atraumatic.  Mouth/Throat: Oropharynx is clear and moist. No oropharyngeal exudate.  Eyes: Pupils are equal, round, and reactive to light.  Neck: Normal range of motion. Neck supple.  Cardiovascular: Normal rate and regular rhythm.  Pulmonary/Chest: No respiratory distress. He has no wheezes.  Abdominal: Soft. Bowel sounds are normal. He exhibits no distension and no mass. There is no guarding.  Musculoskeletal:        General: No tenderness or edema.     Comments: Left leg shin nodule swelling-getting radiation.  Neurological: He is  alert and oriented to person, place, and time.  Skin: Skin is warm.  Psychiatric: Affect normal.       LABORATORY DATA:  I have reviewed the data as listed    Component Value Date/Time   NA 137 12/17/2018 0927   NA  146 (H) 05/25/2016 1514   K 4.6 12/17/2018 0927   CL 99 12/17/2018 0927   CO2 28 12/17/2018 0927   GLUCOSE 210 (H) 12/17/2018 0927   BUN 22 12/17/2018 0927   BUN 19 05/25/2016 1514   CREATININE 1.17 12/17/2018 0927   CALCIUM 9.0 12/17/2018 0927   PROT 6.7 12/17/2018 0927   ALBUMIN 3.8 12/17/2018 0927   AST 20 12/17/2018 0927   ALT 25 12/17/2018 0927   ALKPHOS 87 12/17/2018 0927   BILITOT 0.8 12/17/2018 0927   GFRNONAA 60 (L) 12/17/2018 0927   GFRAA >60 12/17/2018 0927    No results found for: SPEP, UPEP  Lab Results  Component Value Date   WBC 15.5 (H) 12/17/2018   NEUTROABS 14.0 (H) 12/17/2018   HGB 14.6 12/17/2018   HCT 43.6 12/17/2018   MCV 88.4 12/17/2018   PLT 175 12/17/2018      Chemistry      Component Value Date/Time   NA 137 12/17/2018 0927   NA 146 (H) 05/25/2016 1514   K 4.6 12/17/2018 0927   CL 99 12/17/2018 0927   CO2 28 12/17/2018 0927   BUN 22 12/17/2018 0927   BUN 19 05/25/2016 1514   CREATININE 1.17 12/17/2018 0927      Component Value Date/Time   CALCIUM 9.0 12/17/2018 0927   ALKPHOS 87 12/17/2018 0927   AST 20 12/17/2018 0927   ALT 25 12/17/2018 0927   BILITOT 0.8 12/17/2018 0927       RADIOGRAPHIC STUDIES: I have personally reviewed the radiological images as listed and agreed with the findings in the report. No results found.   ASSESSMENT & PLAN:  Cancer of left kidney excluding renal pelvis [facility])  # Left kidney cancer metastatic to the lung-  Jan 9th CT- progression of lung/ bone lesions; currently on Lenvatinib [jan 13th 2020]   #Continue lenvatinib for now.  Tolerating well.  We will plan to repeat CT scan in 2 to 3 months.  # Left leg pain metastases currently on RT [until feb 5th]; Continue Xgeva in 2  weeks.  Continue NSAIDs for pain.  # Labile hypertension-systolic-stable.   #Discussed regarding DNR/DNI/also palliative care referral to discuss goals of care; palliative care appt today.   DISPOSITION: # follow up in 2 weeks- MD/labs; X-geva; cbc/cmp- Dr.B   No orders of the defined types were placed in this encounter.  All questions were answered. The patient knows to call the clinic with any problems, questions or concerns.      Cammie Sickle, MD 12/17/2018 10:51 AM

## 2018-12-17 NOTE — Progress Notes (Signed)
[place]  Telephone:([phone] [phone]   Name: Glen Davis Date: 12/17/2018 MRN: [id number]  DOB: 08/28/1941  Patient Care Team: Dion Body, MD as PCP - General (Family Medicine) Jannet Mantis, MD (Dermatology) Bary Castilla, Forest Gleason, MD (General Surgery) Hollice Espy, MD as Consulting Physician (Urology) Cammie Sickle, MD as Medical Oncologist (Medical Oncology) Isaias Cowman, MD as Consulting Physician (Cardiology) Harriette Bouillon, MD as Referring Physician (Cardiology)    REASON FOR CONSULTATION: Palliative Care consult requested for this 78 y.o. 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  PMH also notable for CAD, COPD, and hypertension.  Patient had skeletal bone survey in January 2020 revealing lytic lesions to the left scapula, left iliac bone, left tibia, and left fibula.  He was referred [facility] for RT.  Patient is referred to palliative care to help with symptoms and to address goals.  SOCIAL HISTORY:    Patient is married and lives at home with his wife. He has a daughter and son, both of whom live nearby. Patient is a retired Programmer, systems.   ADVANCE DIRECTIVES:  [place] POA and living will documents on file dated 03/29/2016.  His healthcare power of attorney is Glen Davis.  CODE STATUS: DNR  PAST MEDICAL HISTORY: Past Medical History:  Diagnosis Date  . CAD (coronary artery disease)   . Cancer ([place])    left arm  . COPD (chronic obstructive pulmonary disease) ([place])   . Hypertension   . Kidney stone   . Lung cancer ([place])   . Melanoma ([place]) 01/24/2016   right shoulder  . Thyroid nodule 02/02/2016   left BENIGN THYROID NODULE by FNA    PAST  SURGICAL HISTORY:  Past Surgical History:  Procedure Laterality Date  . arm surgery Left    arm  . cardiac stents  2011   Angioplasty / Stenting Femoral-X2  . COLONOSCOPY  04/01/12  . CORONARY ANGIOPLASTY    . EXCISION MELANOMA WITH SENTINEL LYMPH NODE BIOPSY Right 01/24/2016   Procedure: EXCISION MELANOMA Right Shoulder;  Surgeon: Robert Bellow, MD;  Location: [facility] ORS;  Service: General;  Laterality: Right;  . LAPAROSCOPIC NEPHRECTOMY, HAND ASSISTED Left 04/24/2016   Procedure: HAND ASSISTED LAPAROSCOPIC NEPHRECTOMY;  Surgeon: Hollice Espy, MD;  Location: [facility] ORS;  Service: Urology;  Laterality: Left;  Marland Kitchen VIDEO ASSISTED THORACOSCOPY (VATS)/THOROCOTOMY Left 03/08/2016   Procedure: VIDEO ASSISTED THORACOSCOPY (VATS) with lung biopsy - Left ;  Surgeon: Robert Bellow, MD;  Location: [facility] ORS;  Service: General;  Laterality: Left;    HEMATOLOGY/ONCOLOGY HISTORY:  Oncology History   # MAY- June 2017- METASTATIC CLEAR CELL; LEFT RENAL CA/STAGE IV; [place]- G-3; INTERMEDIATE RISK;  [bil Pul lung nodules;incidental s/p Bx Dr.Byrnett/Dr.[place]] s/p Cytoreductive nephrectomy; Dr.Brandon- [id number]; July 11th 2017 CT- Enlarging Lung nodules  # Aug 7th 2017- PAZOPANIB; STOP SEP 2017 [pancreatitis/poor tol]  # OCT 1 week- START NIVO q 2W x4; DEC 8th CT- "Progression"; Continue Opdivo;APRIL-MAY 2018- STABLE LUNG NODULES [stopped sec to severe cough; NO Pneumonitis]  # May 31st 2018- Cabo 55m/day; July 31st CT lung- improved lung nodules; Sep 1st week- cabo- 425mday [dose reduced sec to mult intol]; Oct 1st 2week-Cabo 2058may;   # NOV 15th 2018- CT chest- slight progression of lung nodules [poor  tol to higer doses];   # Nov 28th 2018- SUTENT 50 mg 2w-on  &1 w-OFF;  # MARCH 2019- Progression; start Axitinib 5 mg BID; July 2019- MIXED response; worsening/new skeletal lesions/stable to improved lung lesions; STOP Axitinib;   # Left hip s/p RT [mid-AUG 2019]  # AUG 12th- RE-START CABO 40  mg/day; STOP SEP 2019- sec to intol [abdominal pain even on 75m/day]; II opinion at [facility] # OCT 23rd 2019- TORISEL IV q W x 12 treatments; May 28, 2019-progression of the lung nodules bone lesions.  # Jan 2020- 13th-Lenvima 12 mg.  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  MEDICATIONS:  Current Outpatient Medications  Medication Sig Dispense Refill  . acetaminophen (TYLENOL) 500 MG tablet Take 500 mg by mouth every 6 (six) hours as needed for moderate pain.    . Marland Kitchencetaminophen (TYLENOL) 500 MG tablet Take 2 tablets (1,000 mg total) by mouth every 8 (eight) hours as needed. 30 tablet 0  . albuterol (PROVENTIL HFA;VENTOLIN HFA) 108 (90 Base) MCG/ACT inhaler Inhale 2 puffs into the lungs every 6 (six) hours as needed for wheezing or shortness of breath. 1 Inhaler 2  . aspirin EC 81 MG tablet Take 81 mg by mouth daily.    . Aspirin-Salicylamide-Caffeine (BC HEADACHE POWDER PO) Take 1 packet by mouth daily.    . Marland Kitchentorvastatin (LIPITOR) 40 MG tablet Take 40 mg by mouth daily.    . cholecalciferol (VITAMIN D) 400 units TABS tablet Take 400 Units by mouth daily.    . clopidogrel (PLAVIX) 75 MG tablet Take 75 mg by mouth daily.    . Coenzyme Q10 (COQ10) 100 MG CAPS Take 1 capsule by mouth daily.    . Marland Kitchenexamethasone (DECADRON) 4 MG tablet Take 1 tablet (4 mg total) by mouth daily. 10 tablet 0  . dicyclomine (BENTYL) 20 MG tablet Take 1 tablet (20 mg  total) by mouth 3 (three) times daily before meals. (Patient not taking: Reported on 12/04/2018) 60 tablet 0  . diphenoxylate-atropine (LOMOTIL) 2.5-0.025 MG tablet Take 2 tablets by mouth 4 (four) times daily as needed for diarrhea or loose stools. (Patient not taking: Reported on 11/03/2018) 30 tablet 0  . docusate sodium (COLACE) 100 MG capsule Take 1 capsule (100 mg total) by mouth 2 (two) times daily. (Patient not taking: Reported on 11/03/2018) 60 capsule 3  . dronabinol (MARINOL) 2.5 MG capsule Take 1 capsule (2.5 mg total) by mouth 2 (two) times daily before a meal. (Patient not taking: Reported on 11/17/2018) 60 capsule 1  . DULoxetine (CYMBALTA) 30 MG capsule Take 2 capsules by mouth daily.    . fentaNYL (DURAGESIC - DOSED MCG/HR) 25 MCG/HR patch Place 1 patch (25 mcg total) onto the skin every 3 (three) days. (Patient not taking: Reported on 12/01/2018) 10 patch 0  . ferrous sulfate 325 (65 FE) MG EC tablet Take 325 mg by mouth 3 (three) times daily with meals.    . fluticasone (FLONASE) 50 MCG/ACT nasal spray Place 2 sprays into both nostrils daily.  16 g 2  . Fluticasone-Salmeterol (ADVAIR DISKUS) 500-50 MCG/DOSE AEPB Inhale 1 puff into the lungs 2 (two) times daily. 60 each 11  . ipratropium-albuterol (DUONEB) 0.5-2.5 (3) MG/3ML SOLN Take 3 mLs by nebulization every 4 (four) hours as needed. 360 mL 3  . Lenvatinib 12 mg daily dose (LENVIMA, 12 MG DAILY DOSE,) 3 x 4 MG capsule Take 12 mg by mouth daily. 90 capsule 4  . loperamide (IMODIUM) 1 MG/5ML solution Take 4 mg by mouth as needed for diarrhea or loose stools.    Marland Kitchen loratadine (CLARITIN) 10 MG tablet Take 10 mg by mouth daily.    Marland Kitchen MELATONIN PO Take 10 mg by mouth at bedtime.     . metFORMIN (GLUCOPHAGE) 500 MG tablet Take 1 tablet (500 mg total) by mouth 2 (two) times daily with a meal. 60 tablet 3  . Misc Natural Products (GLUCOSAMINE CHOND COMPLEX/MSM) TABS Take 2 tablets by mouth daily.    . montelukast (SINGULAIR) 10 MG tablet TAKE  1 TABLET BY MOUTH EVERYDAY AT BEDTIME 90 tablet 1  . nitroGLYCERIN (NITROSTAT) 0.4 MG SL tablet Place 0.4 mg under the tongue every 5 (five) minutes as needed.     . Omega-3 Fatty Acids (FISH OIL) 1000 MG CPDR Take 1 capsule by mouth daily.    . ondansetron (ZOFRAN) 8 MG tablet Take 1 tablet (8 mg total) by mouth every 8 (eight) hours as needed for nausea or vomiting. (Patient not taking: Reported on 11/03/2018) 60 tablet 0  . OXYGEN Inhale 2 L into the lungs at bedtime.    . tamsulosin (FLOMAX) 0.4 MG CAPS capsule TAKE 1 CAPSULE (0.4 MG TOTAL) BY MOUTH EVERY MORNING. 30 capsule 3  . Tiotropium Bromide Monohydrate (SPIRIVA RESPIMAT) 1.25 MCG/ACT AERS Inhale 1.25 Act into the lungs 2 (two) times daily. 1 Inhaler 11  . tiZANidine (ZANAFLEX) 4 MG tablet Take 4 mg by mouth daily.     . Turmeric 500 MG TABS Take 1 tablet by mouth daily.     No current facility-administered medications for this visit.    Facility-Administered Medications Ordered in Other Visits  Medication Dose Route Frequency Provider Last Rate Last Dose  . 0.9 %  sodium chloride infusion   Intravenous Continuous Cammie Sickle, MD 10 mL/hr at 10/27/18 1054      VITAL SIGNS: There were no vitals taken for this visit. There were no vitals filed for this visit.  Estimated body mass index is 24.65 kg/m as calculated from the following:   Height as of an earlier encounter on 12/17/18: '6\' 1"'  (1.854 m).   Weight as of an earlier encounter on 12/17/18: 186 lb 12.8 oz (84.7 kg).  LABS: CBC:    Component Value Date/Time   WBC 15.5 (H) 12/17/2018 0927   HGB 14.6 12/17/2018 0927   HGB 13.9 05/25/2016 1514   HCT 43.6 12/17/2018 0927   HCT 38.7 05/25/2016 1514   PLT 175 12/17/2018 0927   PLT 177 05/25/2016 1514   MCV 88.4 12/17/2018 0927   MCV 93 05/25/2016 1514   NEUTROABS 14.0 (H) 12/17/2018 0927   LYMPHSABS 0.6 (L) 12/17/2018 0927   MONOABS 0.6 12/17/2018 0927   EOSABS 0.0 12/17/2018 0927   BASOSABS 0.0 12/17/2018  0927   Comprehensive Metabolic Panel:    Component Value Date/Time   NA 140 12/01/2018 0848   NA 146 (H) 05/25/2016 1514   K 4.1 12/01/2018 0848   CL 105 12/01/2018 0848   CO2 27 12/01/2018 0848  BUN 19 12/01/2018 0848   BUN 19 05/25/2016 1514   CREATININE 1.20 12/01/2018 0848   GLUCOSE 198 (H) 12/01/2018 0848   CALCIUM 9.2 12/01/2018 0848   AST 24 11/17/2018 0813   ALT 20 11/17/2018 0813   ALKPHOS 66 11/17/2018 0813   BILITOT 0.9 11/17/2018 0813   PROT 6.5 11/17/2018 0813   ALBUMIN 3.4 (L) 11/17/2018 0813    RADIOGRAPHIC STUDIES: Ct Chest W Contrast  Result Date: 11/28/2018 CLINICAL DATA:  Patient with history of metastatic renal cell carcinoma. Follow-up exam. EXAM: CT CHEST, ABDOMEN, AND PELVIS WITH CONTRAST TECHNIQUE: Multidetector CT imaging of the chest, abdomen and pelvis was performed following the standard protocol during bolus administration of intravenous contrast. CONTRAST:  146m ISOVUE-300 IOPAMIDOL (ISOVUE-300) INJECTION 61% COMPARISON:  CT CAP 08/18/2018 FINDINGS: CT CHEST FINDINGS Cardiovascular: Normal heart size. Trace fluid superior pericardial recess. Aorta main pulmonary artery normal in caliber. Mediastinum/Nodes: Interval development of a 1.3 cm prevascular node (image 18; series 2). No additional axillary or mediastinal adenopathy. Normal appearance of the esophagus. Lungs/Pleura: Central airways are patent. Extensive centrilobular and paraseptal emphysematous change. Interval increase in size of extensive bilateral pulmonary nodules. Reference 2.2 x 2.1 cm left lower lobe nodule (image 113; series 3), previously 1.7 x 1.8 cm. Reference 1.6 x 2.0 cm right lower lobe nodule (image 117; series 3), previously 1.4 x 1.0 cm reference 1.5 x 1.9 cm left upper lobe nodule (image 29; series 3), previously 1.6 x 1.1 cm. Reference 1.6 x 1.9 cm left upper lobe nodule (image 57; series 3), previously 1.1 x 1.1 cm. Reference right upper lobe nodule measuring 1.7 x 1.8 cm (image  39; series 10), previously 1.2 x 1.4 cm. Musculoskeletal: Interval increase in size of lytic lesion with associated fracture involving the left scapula (image 1; series 2), difficult to measure and incompletely visualized. Similar-appearing lytic lesions within the T8 and T9 vertebral bodies. CT ABDOMEN PELVIS FINDINGS Hepatobiliary: Liver is normal in size and contour. Fatty deposition adjacent to the falciform ligament. Gallbladder is unremarkable. No intrahepatic or extrahepatic biliary ductal dilatation. Pancreas: Unremarkable Spleen: Unremarkable Adrenals/Urinary Tract: Prior left nephrectomy. Interval development of a 1.2 cm nodule within the left adrenal gland (image 67; series 2). Interval development of an enhancing 1.1 cm right adrenal nodule (image 56; series 2). New 0.9 cm right adrenal nodule (image 61; series 2). Normal enhancement of the right kidney. Unchanged right renal cyst. No hydronephrosis. Urinary bladder is unremarkable. Stomach/Bowel: Sigmoid colonic diverticulosis. No CT evidence for acute diverticulitis. Interval increase in size of enhancing peritoneal nodule along the ascending colon (image 78; series 2) measuring 1.3 cm, previously 0.8 cm. Vascular/Lymphatic: Normal caliber abdominal aorta. Peripheral calcified atherosclerotic plaque. No retroperitoneal lymphadenopathy. Reproductive: Heterogeneous prostate. Other: Small bilateral fat containing inguinal hernias. Musculoskeletal: Similar size soft tissue lesion involving the left ilium measuring 4.2 x 2.8 cm (image 90; series 2). Similar-appearing 1.5 cm lytic lesion right ilium (image 103; series 2). Similar-appearing lytic lesions involving the L2, L3 and L4 vertebral bodies. IMPRESSION: 1. Interval increase in size of extensive pulmonary metastatic disease. 2. Interval development of enhancing nodularity within the adrenal glands bilaterally compatible with metastatic disease. 3. Interval increase in size of peritoneal nodule along  the ascending colon compatible with peritoneal metastatic disease. 4. Suggestion of increase in size of lytic lesion with associated fracture involving the left scapula. Additional lytic lesions throughout the spine and pelvis are grossly similar in size when compared to prior exam. Electronically Signed   By: DDian Situ  Rosana Hoes M.D.   On: 11/28/2018 14:06   Ct Abdomen Pelvis W Contrast  Result Date: 11/28/2018 CLINICAL DATA:  Patient with history of metastatic renal cell carcinoma. Follow-up exam. EXAM: CT CHEST, ABDOMEN, AND PELVIS WITH CONTRAST TECHNIQUE: Multidetector CT imaging of the chest, abdomen and pelvis was performed following the standard protocol during bolus administration of intravenous contrast. CONTRAST:  14m ISOVUE-300 IOPAMIDOL (ISOVUE-300) INJECTION 61% COMPARISON:  CT CAP 08/18/2018 FINDINGS: CT CHEST FINDINGS Cardiovascular: Normal heart size. Trace fluid superior pericardial recess. Aorta main pulmonary artery normal in caliber. Mediastinum/Nodes: Interval development of a 1.3 cm prevascular node (image 18; series 2). No additional axillary or mediastinal adenopathy. Normal appearance of the esophagus. Lungs/Pleura: Central airways are patent. Extensive centrilobular and paraseptal emphysematous change. Interval increase in size of extensive bilateral pulmonary nodules. Reference 2.2 x 2.1 cm left lower lobe nodule (image 113; series 3), previously 1.7 x 1.8 cm. Reference 1.6 x 2.0 cm right lower lobe nodule (image 117; series 3), previously 1.4 x 1.0 cm reference 1.5 x 1.9 cm left upper lobe nodule (image 29; series 3), previously 1.6 x 1.1 cm. Reference 1.6 x 1.9 cm left upper lobe nodule (image 57; series 3), previously 1.1 x 1.1 cm. Reference right upper lobe nodule measuring 1.7 x 1.8 cm (image 39; series 10), previously 1.2 x 1.4 cm. Musculoskeletal: Interval increase in size of lytic lesion with associated fracture involving the left scapula (image 1; series 2), difficult to measure and  incompletely visualized. Similar-appearing lytic lesions within the T8 and T9 vertebral bodies. CT ABDOMEN PELVIS FINDINGS Hepatobiliary: Liver is normal in size and contour. Fatty deposition adjacent to the falciform ligament. Gallbladder is unremarkable. No intrahepatic or extrahepatic biliary ductal dilatation. Pancreas: Unremarkable Spleen: Unremarkable Adrenals/Urinary Tract: Prior left nephrectomy. Interval development of a 1.2 cm nodule within the left adrenal gland (image 67; series 2). Interval development of an enhancing 1.1 cm right adrenal nodule (image 56; series 2). New 0.9 cm right adrenal nodule (image 61; series 2). Normal enhancement of the right kidney. Unchanged right renal cyst. No hydronephrosis. Urinary bladder is unremarkable. Stomach/Bowel: Sigmoid colonic diverticulosis. No CT evidence for acute diverticulitis. Interval increase in size of enhancing peritoneal nodule along the ascending colon (image 78; series 2) measuring 1.3 cm, previously 0.8 cm. Vascular/Lymphatic: Normal caliber abdominal aorta. Peripheral calcified atherosclerotic plaque. No retroperitoneal lymphadenopathy. Reproductive: Heterogeneous prostate. Other: Small bilateral fat containing inguinal hernias. Musculoskeletal: Similar size soft tissue lesion involving the left ilium measuring 4.2 x 2.8 cm (image 90; series 2). Similar-appearing 1.5 cm lytic lesion right ilium (image 103; series 2). Similar-appearing lytic lesions involving the L2, L3 and L4 vertebral bodies. IMPRESSION: 1. Interval increase in size of extensive pulmonary metastatic disease. 2. Interval development of enhancing nodularity within the adrenal glands bilaterally compatible with metastatic disease. 3. Interval increase in size of peritoneal nodule along the ascending colon compatible with peritoneal metastatic disease. 4. Suggestion of increase in size of lytic lesion with associated fracture involving the left scapula. Additional lytic lesions  throughout the spine and pelvis are grossly similar in size when compared to prior exam. Electronically Signed   By: DLovey NewcomerM.D.   On: 11/28/2018 14:06   Dg Bone Survey Met  Result Date: 12/01/2018 CLINICAL DATA:  Multiple myeloma. EXAM: METASTATIC BONE SURVEY COMPARISON:  PET scan of February 01, 2016. CT scan of November 28, 2018. FINDINGS: LAlmyra Braceis seen in posterior skull which may represent venous [place], but lytic lesion can not be excluded. Lytic lesion  is noted in the left scapula in the region of the glenoid fossa. Right shoulder is unremarkable. Visualized ribs are unremarkable. Bilateral humeri are unremarkable. Both forearms are unremarkable. Degenerative changes are noted throughout the cervical, thoracic and lumbar spine, but no radiographically apparent lytic lesion is noted. Lytic lesion is seen involving the superior portion of left iliac wing. Sclerotic lesion is seen above the left acetabulum which could represent metastatic disease. The femurs are unremarkable. Lytic lesions are noted in the proximal left fibula and tibia as well as distal left tibia. Right tibia and fibula are unremarkable. IMPRESSION: Lytic lesions are noted in the left scapula, left iliac bone, left tibia and left fibula. Lucency seen in posterior skull which may represent benign venous [place], but lytic lesion can not be excluded. Rounded sclerotic lesion seen above the left acetabulum which could represent metastatic disease. Electronically Signed   By: Marijo Conception, M.D.   On: 12/01/2018 15:12    PERFORMANCE STATUS (ECOG) : 1 - Symptomatic but completely ambulatory  Review of Systems As noted above. Otherwise, a complete review of systems is negative.  Physical Exam General: NAD, frail appearing, thin Cardiovascular: regular rate and rhythm Pulmonary: clear ant fields Abdomen: firm, tympanic, nttp GU: no suprapubic tenderness Extremities: no edema, no joint deformities Skin: no rashes Neurological:  Weakness but otherwise nonfocal  IMPRESSION: I met today with patient and his daughter in the clinic.  Attempted to establish a therapeutic rapport and discussed the nature of palliative care services.  Patient also saw Dr. Rogue Bussing today.  Patient has had disease progression on treatment and is felt to overall have a poor long-term prognosis.  I was asked to help address future planning and to identify goals.  Patient says he recognizes that his cancer is incurable.  However, he currently feels like he is doing well in the absence of any acute or distressing symptoms.  His goals are still aligned with active treatment but he recognizes that he will reach a point where he is no longer a treatment candidate.  He asked me exactly how much time he has left.  We talked about the inevitable future decline in him needing more resources such as those that could be provided by hospice.  However, patient clearly wants to focus more on now and said he really did not want to think too much about the future.  He says he is not really talked to family about future goals.  His current goal is to receive treatment with the hope that it will prolong his life.  He did verbalize a desire to ensure that he maintains a quality of life for as long as possible.  He is still functional and able to do activities he finds enjoyable.  He is no longer able to ride his motorcycle but enjoys going outside and washing and waxing his car and motorcycle.  Patient does have ACP documents on file.  His wife would be his decision maker if needed.  He says he has a signed DNR order at home.  Symptomatically, patient denies any acute or distressing symptoms.  He has occasional pain for which he self medicates with BC powder.  He does not report requiring the [facility] powder regularly.  He does endorse insomnia and requests a medication for as needed use.  He has been taking melatonin 10 mg at bedtime without good effect.  He has problems  primarily initiating sleep.  He denies daytime napping.  We discussed good sleep hygiene.  Patient is already taking duloxetine.  Would consider trial of ramelteon but it does not appear that it is covered under patient's formulary.  Will instead give trial of low-dose temazepam.  PLAN: 1.  Treatment plan as outlined per oncology 2.  Temazepam 7.5 mg nightly as needed for insomnia (Rx #30) 3.  DNR 4.  RTC in 2 weeks  Patient expressed understanding and was in agreement with this plan. He also understands that He can call clinic at any time with any questions, concerns, or complaints.   Time Total: 30 minutes  Visit consisted of counseling and education dealing with the complex and emotionally intense issues of symptom management and palliative care in the setting of serious and potentially life-threatening illness.Greater than 50%  of this time was spent counseling and coordinating care related to the above assessment and plan.  Signed by: Altha Harm, PhD, NP-C [phone] ([facility])

## 2018-12-17 NOTE — Assessment & Plan Note (Addendum)
#   Left kidney cancer metastatic to the lung-  Jan 9th CT- progression of lung/ bone lesions; currently on Lenvatinib [jan 13th 2020]   #Continue lenvatinib for now.  Tolerating well.  We will plan to repeat CT scan in 2 to 3 months.  # Left leg pain metastases currently on RT [until feb 5th]; Continue Xgeva in 2 weeks.  Continue NSAIDs for pain.  # Labile hypertension-systolic-stable.   #Discussed regarding DNR/DNI/also palliative care referral to discuss goals of care; palliative care appt today.   DISPOSITION: # follow up in 2 weeks- MD/labs; X-geva; cbc/cmp- Dr.B

## 2018-12-18 ENCOUNTER — Ambulatory Visit
Admission: RE | Admit: 2018-12-18 | Discharge: 2018-12-18 | Disposition: A | Discharge status: Home / Self Care | Payer: PPO | Source: Ambulatory Visit | Attending: Radiation Oncology | Admitting: Radiation Oncology

## 2018-12-18 DIAGNOSIS — Z510 Encounter for antineoplastic radiation therapy: Secondary | ICD-10-CM | POA: Diagnosis not present

## 2018-12-19 ENCOUNTER — Ambulatory Visit
Admission: RE | Admit: 2018-12-19 | Discharge: 2018-12-19 | Disposition: A | Discharge status: Home / Self Care | Payer: PPO | Source: Ambulatory Visit | Attending: Radiation Oncology | Admitting: Radiation Oncology

## 2018-12-19 DIAGNOSIS — Z510 Encounter for antineoplastic radiation therapy: Secondary | ICD-10-CM | POA: Diagnosis not present

## 2018-12-20 DIAGNOSIS — C642 Malignant neoplasm of left kidney, except renal pelvis: Secondary | ICD-10-CM | POA: Diagnosis not present

## 2018-12-20 DIAGNOSIS — R05 Cough: Secondary | ICD-10-CM | POA: Diagnosis not present

## 2018-12-20 DIAGNOSIS — J449 Chronic obstructive pulmonary disease, unspecified: Secondary | ICD-10-CM | POA: Diagnosis not present

## 2018-12-20 DIAGNOSIS — R0602 Shortness of breath: Secondary | ICD-10-CM | POA: Diagnosis not present

## 2018-12-22 ENCOUNTER — Ambulatory Visit
Admission: RE | Admit: 2018-12-22 | Discharge: 2018-12-22 | Disposition: A | Discharge status: Home / Self Care | Payer: PPO | Source: Ambulatory Visit | Attending: Radiation Oncology | Admitting: Radiation Oncology

## 2018-12-22 DIAGNOSIS — C649 Malignant neoplasm of unspecified kidney, except renal pelvis: Secondary | ICD-10-CM | POA: Diagnosis not present

## 2018-12-22 DIAGNOSIS — Z510 Encounter for antineoplastic radiation therapy: Secondary | ICD-10-CM | POA: Insufficient documentation

## 2018-12-22 DIAGNOSIS — C7951 Secondary malignant neoplasm of bone: Secondary | ICD-10-CM | POA: Insufficient documentation

## 2018-12-23 ENCOUNTER — Ambulatory Visit
Admission: RE | Admit: 2018-12-23 | Discharge: 2018-12-23 | Disposition: A | Discharge status: Home / Self Care | Payer: PPO | Source: Ambulatory Visit | Attending: Radiation Oncology | Admitting: Radiation Oncology

## 2018-12-23 DIAGNOSIS — Z510 Encounter for antineoplastic radiation therapy: Secondary | ICD-10-CM | POA: Diagnosis not present

## 2018-12-24 ENCOUNTER — Ambulatory Visit
Admission: RE | Admit: 2018-12-24 | Discharge: 2018-12-24 | Disposition: A | Discharge status: Home / Self Care | Payer: PPO | Source: Ambulatory Visit | Attending: Radiation Oncology | Admitting: Radiation Oncology

## 2018-12-24 DIAGNOSIS — C642 Malignant neoplasm of left kidney, except renal pelvis: Secondary | ICD-10-CM | POA: Diagnosis not present

## 2018-12-24 DIAGNOSIS — Z510 Encounter for antineoplastic radiation therapy: Secondary | ICD-10-CM | POA: Diagnosis not present

## 2018-12-24 DIAGNOSIS — C7951 Secondary malignant neoplasm of bone: Secondary | ICD-10-CM | POA: Diagnosis not present

## 2018-12-30 ENCOUNTER — Other Ambulatory Visit: Payer: Self-pay

## 2018-12-30 DIAGNOSIS — C44622 Squamous cell carcinoma of skin of right upper limb, including shoulder: Secondary | ICD-10-CM

## 2018-12-30 DIAGNOSIS — C642 Malignant neoplasm of left kidney, except renal pelvis: Secondary | ICD-10-CM

## 2018-12-31 ENCOUNTER — Inpatient Hospital Stay: Payer: PPO | Attending: Internal Medicine

## 2018-12-31 ENCOUNTER — Inpatient Hospital Stay (HOSPITAL_BASED_OUTPATIENT_CLINIC_OR_DEPARTMENT_OTHER): Payer: PPO | Admitting: Internal Medicine

## 2018-12-31 ENCOUNTER — Inpatient Hospital Stay: Payer: PPO

## 2018-12-31 ENCOUNTER — Encounter: Payer: Self-pay | Admitting: Internal Medicine

## 2018-12-31 ENCOUNTER — Inpatient Hospital Stay (HOSPITAL_BASED_OUTPATIENT_CLINIC_OR_DEPARTMENT_OTHER): Payer: PPO | Admitting: Hospice and Palliative Medicine

## 2018-12-31 VITALS — BP 157/93 | HR 70 | Temp 97.7°F | Resp 16 | Wt 184.0 lb

## 2018-12-31 DIAGNOSIS — G8929 Other chronic pain: Secondary | ICD-10-CM

## 2018-12-31 DIAGNOSIS — C7951 Secondary malignant neoplasm of bone: Secondary | ICD-10-CM

## 2018-12-31 DIAGNOSIS — C642 Malignant neoplasm of left kidney, except renal pelvis: Secondary | ICD-10-CM | POA: Diagnosis not present

## 2018-12-31 DIAGNOSIS — Z7982 Long term (current) use of aspirin: Secondary | ICD-10-CM | POA: Insufficient documentation

## 2018-12-31 DIAGNOSIS — Z7984 Long term (current) use of oral hypoglycemic drugs: Secondary | ICD-10-CM | POA: Insufficient documentation

## 2018-12-31 DIAGNOSIS — I10 Essential (primary) hypertension: Secondary | ICD-10-CM

## 2018-12-31 DIAGNOSIS — Z515 Encounter for palliative care: Secondary | ICD-10-CM | POA: Insufficient documentation

## 2018-12-31 DIAGNOSIS — Z5112 Encounter for antineoplastic immunotherapy: Secondary | ICD-10-CM

## 2018-12-31 DIAGNOSIS — Z9221 Personal history of antineoplastic chemotherapy: Secondary | ICD-10-CM | POA: Insufficient documentation

## 2018-12-31 DIAGNOSIS — Z85820 Personal history of malignant melanoma of skin: Secondary | ICD-10-CM

## 2018-12-31 DIAGNOSIS — R2242 Localized swelling, mass and lump, left lower limb: Secondary | ICD-10-CM | POA: Insufficient documentation

## 2018-12-31 DIAGNOSIS — I2510 Atherosclerotic heart disease of native coronary artery without angina pectoris: Secondary | ICD-10-CM | POA: Diagnosis not present

## 2018-12-31 DIAGNOSIS — C7800 Secondary malignant neoplasm of unspecified lung: Secondary | ICD-10-CM | POA: Insufficient documentation

## 2018-12-31 DIAGNOSIS — Z79899 Other long term (current) drug therapy: Secondary | ICD-10-CM

## 2018-12-31 DIAGNOSIS — G893 Neoplasm related pain (acute) (chronic): Secondary | ICD-10-CM

## 2018-12-31 DIAGNOSIS — J449 Chronic obstructive pulmonary disease, unspecified: Secondary | ICD-10-CM | POA: Insufficient documentation

## 2018-12-31 DIAGNOSIS — Z923 Personal history of irradiation: Secondary | ICD-10-CM

## 2018-12-31 DIAGNOSIS — M25512 Pain in left shoulder: Secondary | ICD-10-CM

## 2018-12-31 DIAGNOSIS — Z87442 Personal history of urinary calculi: Secondary | ICD-10-CM | POA: Diagnosis not present

## 2018-12-31 LAB — CBC WITH DIFFERENTIAL/PLATELET
Abs Immature Granulocytes: 0.01 10*3/uL (ref 0.00–0.07)
Basophils Absolute: 0 10*3/uL (ref 0.0–0.1)
Basophils Relative: 0 %
Eosinophils Absolute: 0.1 10*3/uL (ref 0.0–0.5)
Eosinophils Relative: 1 %
HCT: 40.2 % (ref 39.0–52.0)
HEMOGLOBIN: 13.5 g/dL (ref 13.0–17.0)
Immature Granulocytes: 0 %
LYMPHS ABS: 0.9 10*3/uL (ref 0.7–4.0)
LYMPHS PCT: 19 %
MCH: 29.7 pg (ref 26.0–34.0)
MCHC: 33.6 g/dL (ref 30.0–36.0)
MCV: 88.5 fL (ref 80.0–100.0)
Monocytes Absolute: 0.5 10*3/uL (ref 0.1–1.0)
Monocytes Relative: 12 %
Neutro Abs: 3 10*3/uL (ref 1.7–7.7)
Neutrophils Relative %: 68 %
Platelets: 130 10*3/uL — ABNORMAL LOW (ref 150–400)
RBC: 4.54 MIL/uL (ref 4.22–5.81)
RDW: 18.6 % — ABNORMAL HIGH (ref 11.5–15.5)
WBC: 4.4 10*3/uL (ref 4.0–10.5)
nRBC: 0 % (ref 0.0–0.2)

## 2018-12-31 LAB — COMPREHENSIVE METABOLIC PANEL
ALBUMIN: 3.8 g/dL (ref 3.5–5.0)
ALT: 15 U/L (ref 0–44)
AST: 16 U/L (ref 15–41)
Alkaline Phosphatase: 86 U/L (ref 38–126)
Anion gap: 7 (ref 5–15)
BUN: 14 mg/dL (ref 8–23)
CHLORIDE: 103 mmol/L (ref 98–111)
CO2: 28 mmol/L (ref 22–32)
Calcium: 8.9 mg/dL (ref 8.9–10.3)
Creatinine, Ser: 1.08 mg/dL (ref 0.61–1.24)
GFR calc Af Amer: >60 mL/min (ref >60)
GFR calc non Af Amer: >60 mL/min (ref >60)
Glucose, Bld: 114 mg/dL — ABNORMAL HIGH (ref 70–99)
Potassium: 4 mmol/L (ref 3.5–5.1)
Sodium: 138 mmol/L (ref 135–145)
Total Bilirubin: 0.7 mg/dL (ref 0.3–1.2)
Total Protein: 6.6 g/dL (ref 6.5–8.1)

## 2018-12-31 LAB — TSH: TSH: 4.921 u[IU]/mL — ABNORMAL HIGH (ref 0.350–4.500)

## 2018-12-31 MED ORDER — DENOSUMAB 120 MG/1.7ML ~~LOC~~ SOLN
120.0000 mg | Freq: Once | SUBCUTANEOUS | Status: AC
  Administered 2018-12-31: 120 mg via SUBCUTANEOUS
  Filled 2018-12-31: qty 1.7

## 2018-12-31 MED ORDER — HYDROMORPHONE HCL 2 MG PO TABS: 2.0000 mg | ORAL_TABLET | ORAL | 0 refills | Status: DC | PRN

## 2018-12-31 MED FILL — LENVIMA 12 MG DAILY DOSE 4: 3 X 4 | 30 days supply | Qty: 90 | Fill #1

## 2018-12-31 NOTE — Progress Notes (Signed)
[place] OFFICE PROGRESS NOTE  Patient Care Team: Dion Body, MD as PCP - General (Family Medicine) Jannet Mantis, MD (Dermatology) Bary Castilla, Forest Gleason, MD (General Surgery) Hollice Espy, MD as Consulting Physician (Urology) Cammie Sickle, MD as Medical Oncologist (Medical Oncology) Isaias Cowman, MD as Consulting Physician (Cardiology) Harriette Bouillon, MD as Referring Physician (Cardiology)  Cancer Staging No matching staging information was found for the patient.   Oncology History   # MAY- June 2017- METASTATIC CLEAR CELL; LEFT RENAL CA/STAGE IV; [place]- G-3; INTERMEDIATE RISK;  [bil Pul lung nodules;incidental s/p Bx Dr.Byrnett/Dr.Oaks] s/p Cytoreductive nephrectomy; Dr.Brandon- [id number]; July 11th 2017 CT- Enlarging Lung nodules  # Aug 7th 2017- PAZOPANIB; STOP SEP 2017 [pancreatitis/poor tol]  # OCT 1 week- START NIVO q 2W x4; DEC 8th CT- "Progression"; Continue Opdivo;APRIL-MAY 2018- STABLE LUNG NODULES [stopped sec to severe cough; NO Pneumonitis]  # May 31st 2018- Cabo 61m/day; July 31st CT lung- improved lung nodules; Sep 1st week- cabo- 46mday [dose reduced sec to mult intol]; Oct 1st 2week-Cabo [id number];   # NOV 15th 2018- CT chest- slight progression of lung nodules [poor tol to higer doses];   # Nov 28th 2018- SUTENT 50 mg 2w-on  &1 w-OFF;  # MARCH 2019- Progression; start Axitinib 5 mg BID; July 2019- MIXED response; worsening/new skeletal lesions/stable to improved lung lesions; STOP Axitinib;   # Left hip s/p RT [mid-AUG 2019]  # AUG 12th- RE-START CABO 40 mg/day; STOP SEP 2019- sec to intol [abdominal pain even on 35m36my]; [facility] q W x 12 treatments; May 28, 2019-progression of the lung nodules bone lesions.  # Jan 2020- 13th-Lenvima 12 mg.  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];STAGE I  0.42 millimeter  depth; s/p WLE [Dr.Byrnett]  ---------------------------------------------------------    # March 2019- Molecular testing- PDL-1 NEG; TMB-Low; MSI-STABLE; No targets; VHL mutation of  unknown significance  -----------------------------------------------------   # Dx; Kidney cancer/ clear cell STAGE IV  Current treatment: Lenvima 12mg35m [jan 13th 2020]  Goal: Palliative     Cancer of left kidney excluding renal pelvis (HCC)[facility]  INTERVAL HISTORY:  [place].44  male pleasant patient above history of metastatic renal cell cancer currently on lenvatinib is here for follow-up.  Patient complains of worsening pain in his left shoulder.  He is taking over-the-counter NSAIDs.  No significant pain control.  Denies any headaches but denies any nausea vomiting.  Continues to have a lump in the left shin.  Status post radiation.  Pain improved.  Review of Systems  Constitutional: Positive for malaise/fatigue. Negative for chills, diaphoresis and fever.  HENT: Negative for nosebleeds and sore throat.   Eyes: Negative for double vision.  Respiratory: Negative for cough, hemoptysis, sputum production, shortness of breath and wheezing.   Cardiovascular: Negative for chest pain, palpitations, orthopnea and leg swelling.  Gastrointestinal: Negative for blood in stool, constipation, heartburn, melena and vomiting.  Genitourinary: Negative for dysuria, frequency and urgency.  Musculoskeletal: Positive for back pain and joint pain (left shoulder pain).       Approximate 2 cm nodule noted on the left mid tibia.  Skin: Negative.  Negative for itching and rash.  Neurological: Negative for tingling, focal weakness and weakness.  Endo/Heme/Allergies: Does not bruise/bleed easily.  Psychiatric/Behavioral: Negative for depression. The patient is not nervous/anxious.       PAST  MEDICAL HISTORY :  Past Medical History:  Diagnosis Date  . CAD (coronary artery disease)   . Cancer ([place])     left arm  . COPD (chronic obstructive pulmonary disease) ([place])   . Hypertension   . Kidney stone   . Lung cancer ([place])   . Melanoma ([place]) 01/24/2016   right shoulder  . Thyroid nodule 02/02/2016   left BENIGN THYROID NODULE by FNA    PAST SURGICAL HISTORY :   Past Surgical History:  Procedure Laterality Date  . arm surgery Left    arm  . cardiac stents  2011   Angioplasty / Stenting Femoral-X2  . COLONOSCOPY  04/01/12  . CORONARY ANGIOPLASTY    . EXCISION MELANOMA WITH SENTINEL LYMPH NODE BIOPSY Right 01/24/2016   Procedure: EXCISION MELANOMA Right Shoulder;  Surgeon: Robert Bellow, MD;  Location: [facility] ORS;  Service: General;  Laterality: Right;  . LAPAROSCOPIC NEPHRECTOMY, HAND ASSISTED Left 04/24/2016   Procedure: HAND ASSISTED LAPAROSCOPIC NEPHRECTOMY;  Surgeon: Hollice Espy, MD;  Location: [facility] ORS;  Service: Urology;  Laterality: Left;  Marland Kitchen VIDEO ASSISTED THORACOSCOPY (VATS)/THOROCOTOMY Left 03/08/2016   Procedure: VIDEO ASSISTED THORACOSCOPY (VATS) with lung biopsy - Left ;  Surgeon: Robert Bellow, MD;  Location: [facility] ORS;  Service: General;  Laterality: Left;    FAMILY HISTORY :   Family History  Problem Relation Age of Onset  . Hodgkin's lymphoma Daughter 5  . Heart attack Father   . Kidney cancer Neg Hx   . Kidney disease Neg Hx   . Prostate cancer Neg Hx     SOCIAL HISTORY:   Social History   Tobacco Use  . Smoking status: Former Smoker    Packs/day: 2.00    Years: 30.00    Pack years: 60.00    Types: Cigarettes    Start date: 01/18/1956    Last attempt to quit: 01/17/1969    Years since quitting: 49.9  . Smokeless tobacco: Never Used  Substance Use Topics  . Alcohol use: No    Alcohol/week: 0.0 standard drinks    Comment: BEER OCC  . Drug use: No    ALLERGIES:  is allergic to lisinopril and other.  MEDICATIONS:  Current Outpatient Medications  Medication Sig Dispense Refill  . albuterol (PROVENTIL HFA;VENTOLIN HFA) 108 (90 Base) MCG/ACT  inhaler Inhale 2 puffs into the lungs every 6 (six) hours as needed for wheezing or shortness of breath. 1 Inhaler 2  . aspirin EC 81 MG tablet Take 81 mg by mouth daily.    . Aspirin-Salicylamide-Caffeine (BC HEADACHE POWDER PO) Take 1 packet by mouth daily.    Marland Kitchen atorvastatin (LIPITOR) 40 MG tablet Take 40 mg by mouth daily.    . cholecalciferol (VITAMIN D) 400 units TABS tablet Take 400 Units by mouth daily.    . clopidogrel (PLAVIX) 75 MG tablet Take 75 mg by mouth daily.    . Coenzyme Q10 (COQ10) 100 MG CAPS Take 1 capsule by mouth daily.    . DULoxetine (CYMBALTA) 30 MG capsule Take 2 capsules by mouth daily.    . fluticasone (FLONASE) 50 MCG/ACT nasal spray Place 2 sprays into both nostrils daily. 16 g 2  . Fluticasone-Salmeterol (ADVAIR DISKUS) 500-50 MCG/DOSE AEPB Inhale 1 puff into the lungs 2 (two) times daily. 60 each 11  . ipratropium-albuterol (DUONEB) 0.5-2.5 (3) MG/3ML SOLN Take 3 mLs by nebulization every 4 (four) hours as needed. 360 mL 3  . Lenvatinib 12 mg daily dose (LENVIMA, 12 MG DAILY  DOSE,) 3 x 4 MG capsule Take 12 mg by mouth daily. 90 capsule 4  . loperamide (IMODIUM) 1 MG/5ML solution Take 4 mg by mouth as needed for diarrhea or loose stools.    Marland Kitchen loratadine (CLARITIN) 10 MG tablet Take 10 mg by mouth daily.    Marland Kitchen MELATONIN PO Take 10 mg by mouth at bedtime.     . metFORMIN (GLUCOPHAGE) 500 MG tablet Take 1 tablet (500 mg total) by mouth 2 (two) times daily with a meal. 60 tablet 3  . Misc Natural Products (GLUCOSAMINE CHOND COMPLEX/MSM) TABS Take 2 tablets by mouth daily.    . montelukast (SINGULAIR) 10 MG tablet TAKE 1 TABLET BY MOUTH EVERYDAY AT BEDTIME 90 tablet 1  . nitroGLYCERIN (NITROSTAT) 0.4 MG SL tablet Place 0.4 mg under the tongue every 5 (five) minutes as needed.     . Omega-3 Fatty Acids (FISH OIL) 1000 MG CPDR Take 1 capsule by mouth daily.    . ondansetron (ZOFRAN) 8 MG tablet Take 1 tablet (8 mg total) by mouth every 8 (eight) hours as needed for nausea  or vomiting. 60 tablet 0  . OXYGEN Inhale 2 L into the lungs at bedtime.    . tamsulosin (FLOMAX) 0.4 MG CAPS capsule TAKE 1 CAPSULE (0.4 MG TOTAL) BY MOUTH EVERY MORNING. 30 capsule 3  . temazepam (RESTORIL) 7.5 MG capsule Take 1 capsule (7.5 mg total) by mouth at bedtime as needed for sleep. 30 capsule 0  . Tiotropium Bromide Monohydrate (SPIRIVA RESPIMAT) 1.25 MCG/ACT AERS Inhale 1.25 Act into the lungs 2 (two) times daily. 1 Inhaler 11  . tiZANidine (ZANAFLEX) 4 MG tablet Take 4 mg by mouth daily.     . Turmeric 500 MG TABS Take 1 tablet by mouth daily.    Marland Kitchen HYDROmorphone (DILAUDID) 2 MG tablet Take 1 tablet (2 mg total) by mouth every 4 (four) hours as needed for moderate pain or severe pain. 45 tablet 0   No current facility-administered medications for this visit.    Facility-Administered Medications Ordered in Other Visits  Medication Dose Route Frequency Provider Last Rate Last Dose  . 0.9 %  sodium chloride infusion   Intravenous Continuous Cammie Sickle, MD 10 mL/hr at 10/27/18 1054      PHYSICAL EXAMINATION: ECOG PERFORMANCE STATUS: 1 - Symptomatic but completely ambulatory  BP (!) 157/93 (BP Location: Left Arm, Patient Position: Sitting, Cuff Size: Normal)   Pulse 70   Temp 97.7 F (36.5 C) (Tympanic)   Resp 16   Wt 184 lb (83.5 kg)   BMI 24.28 kg/m   Filed Weights   12/31/18 1427  Weight: 184 lb (83.5 kg)    Physical Exam  Constitutional: He is oriented to person, place, and time and well-developed, well-nourished, and in no distress.  Accompanied by his daughter. He is walking by himself.  HENT:  Head: Normocephalic and atraumatic.  Mouth/Throat: Oropharynx is clear and moist. No oropharyngeal exudate.  Eyes: Pupils are equal, round, and reactive to light.  Neck: Normal range of motion. Neck supple.  Cardiovascular: Normal rate and regular rhythm.  Pulmonary/Chest: No respiratory distress. He has no wheezes.  Abdominal: Soft. Bowel sounds are normal.  He exhibits no distension and no mass. There is no guarding.  Musculoskeletal:        General: No tenderness or edema.     Comments: Left leg shin nodule swelling-getting radiation.  Neurological: He is alert and oriented to person, place, and time.  Skin: Skin is  warm.  Psychiatric: Affect normal.       LABORATORY DATA:  I have reviewed the data as listed    Component Value Date/Time   NA 138 12/31/2018 1343   NA 146 (H) 05/25/2016 1514   K 4.0 12/31/2018 1343   CL 103 12/31/2018 1343   CO2 28 12/31/2018 1343   GLUCOSE 114 (H) 12/31/2018 1343   BUN 14 12/31/2018 1343   BUN 19 05/25/2016 1514   CREATININE 1.08 12/31/2018 1343   CALCIUM 8.9 12/31/2018 1343   PROT 6.6 12/31/2018 1343   ALBUMIN 3.8 12/31/2018 1343   AST 16 12/31/2018 1343   ALT 15 12/31/2018 1343   ALKPHOS 86 12/31/2018 1343   BILITOT 0.7 12/31/2018 1343   GFRNONAA >60 12/31/2018 1343   GFRAA >60 12/31/2018 1343    No results found for: SPEP, UPEP  Lab Results  Component Value Date   WBC 4.4 12/31/2018   NEUTROABS 3.0 12/31/2018   HGB 13.5 12/31/2018   HCT 40.2 12/31/2018   MCV 88.5 12/31/2018   PLT 130 (L) 12/31/2018      Chemistry      Component Value Date/Time   NA 138 12/31/2018 1343   NA 146 (H) 05/25/2016 1514   K 4.0 12/31/2018 1343   CL 103 12/31/2018 1343   CO2 28 12/31/2018 1343   BUN 14 12/31/2018 1343   BUN 19 05/25/2016 1514   CREATININE 1.08 12/31/2018 1343      Component Value Date/Time   CALCIUM 8.9 12/31/2018 1343   ALKPHOS 86 12/31/2018 1343   AST 16 12/31/2018 1343   ALT 15 12/31/2018 1343   BILITOT 0.7 12/31/2018 1343       RADIOGRAPHIC STUDIES: I have personally reviewed the radiological images as listed and agreed with the findings in the report. No results found.   ASSESSMENT & PLAN:  Cancer of left kidney excluding renal pelvis [facility])  # Left kidney cancer metastatic to the lung-  Jan 9th CT- progression of lung/ bone lesions; currently on Lenvatinib [jan  13th 2020]   #Continue lenvatinib for now.  Tolerating well; but getting worse; STOP lenvatinib   # Left leg pain metastases currently on RT [until feb 5th]; Continue Xgeva in 2 weeks.   # Labile hypertension-systolic-stable.   #Discussed regarding DNR/DNI/also palliative care referral to discuss goals of care; palliative care appt today.   DISPOSITION: # x-geva today. Add TSH today. # follow up in 1 weeks- MD/labs;cbc/cmp;IPI+NIVO- Dr.B   Orders Placed This Encounter  Procedures  . TSH    Standing Status:   Future    Number of Occurrences:   1    Standing Expiration Date:   01/01/2020  . CBC with Differential/Platelet    Standing Status:   Future    Standing Expiration Date:   01/01/2020  . Comprehensive metabolic panel    Standing Status:   Future    Standing Expiration Date:   01/01/2020   All questions were answered. The patient knows to call the clinic with any problems, questions or concerns.      Cammie Sickle, MD 01/01/2019 2:38 PM

## 2018-12-31 NOTE — Assessment & Plan Note (Addendum)
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 STOP lenvatinib.  #A long discussion the patient and daughter regarding the progressive nature of the disease.  Unclear if lenvatinib is taking time versus progressive disease.  Given the narrow room for any therapeutic intervention-I would recommend switching therapy to combination immunotherapy.  I would recommend ipi+Nivo every 3 weeks.    I discussed the mechanism of action; The goal of therapy is palliative; and length of treatments are likely ongoing/based upon the results of the scans. Discussed the potential side effects of immunotherapy including but not limited to diarrhea; skin rash; elevated LFTs/endocrine abnormalities etc.  After lengthy discussion weighing risk versus benefits patient agrees to proceed with treatment as discussed above.   # Left leg pain metastases currently on RT [until feb 5th]; Continue Xgeva in 2 weeks.   # Labile hypertension-systolic-stable.   #Discussed regarding DNR/DNI/also palliative care referral to discuss goals of care; palliative care appt today.  Discussed with Josh.  Recommend holding of steroids given the plan for immunotherapy.  Continue Dilaudid.  DISPOSITION: # x-geva today. Add TSH today. # follow up in 1 weeks- MD/labs;cbc/cmp;IPI+NIVO- Dr.B

## 2018-12-31 NOTE — Progress Notes (Signed)
[facility]  [phone] [phone]   Name: Glen Davis Date: 12/31/2018 MRN: [id number]  DOB: May 18, 1941  Patient Care Team: Dion Body, MD as PCP - General (Family Medicine) Jannet Mantis, MD (Dermatology) Bary Castilla, Forest Gleason, MD (General Surgery) Hollice Espy, MD as Consulting Physician (Urology) Cammie Sickle, MD as Medical Oncologist (Medical Oncology) Isaias Cowman, MD as Consulting Physician (Cardiology) Harriette Bouillon, MD as Referring Physician (Cardiology)    REASON FOR CONSULTATION: Palliative Care consult requested for this 78 y.o. 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  PMH also notable for CAD, COPD, and hypertension.  Patient had skeletal bone survey in January 2020 revealing lytic lesions to the left scapula, left iliac bone, left tibia, and left fibula.  He was referred to [facility] for RT.  Patient is referred to palliative care to help with symptoms and to address goals.  SOCIAL HISTORY:    Patient is married and lives at home with his wife. He has a daughter and son, both of whom live nearby. Patient is a retired Programmer, systems.   ADVANCE DIRECTIVES:  [place] POA and living will documents on file dated 03/29/2016.  His healthcare power of attorney is Glen Davis.  CODE STATUS: DNR  PAST MEDICAL HISTORY: Past Medical History:  Diagnosis Date  . CAD (coronary artery disease)   . Cancer ([place])    left arm  . COPD (chronic obstructive pulmonary disease) ([place])   . Hypertension   . Kidney stone   . Lung cancer ([place])   . Melanoma ([place]) 01/24/2016   right shoulder  . Thyroid nodule 02/02/2016   left BENIGN THYROID NODULE by FNA    PAST  SURGICAL HISTORY:  Past Surgical History:  Procedure Laterality Date  . arm surgery Left    arm  . cardiac stents  2011   Angioplasty / Stenting Femoral-X2  . COLONOSCOPY  04/01/12  . CORONARY ANGIOPLASTY    . EXCISION MELANOMA WITH SENTINEL LYMPH NODE BIOPSY Right 01/24/2016   Procedure: EXCISION MELANOMA Right Shoulder;  Surgeon: Robert Bellow, MD;  Location: [facility] ORS;  Service: General;  Laterality: Right;  . LAPAROSCOPIC NEPHRECTOMY, HAND ASSISTED Left 04/24/2016   Procedure: HAND ASSISTED LAPAROSCOPIC NEPHRECTOMY;  Surgeon: Hollice Espy, MD;  Location: [facility] ORS;  Service: Urology;  Laterality: Left;  Marland Kitchen VIDEO ASSISTED THORACOSCOPY (VATS)/THOROCOTOMY Left 03/08/2016   Procedure: VIDEO ASSISTED THORACOSCOPY (VATS) with lung biopsy - Left ;  Surgeon: Robert Bellow, MD;  Location: [facility] ORS;  Service: General;  Laterality: Left;    HEMATOLOGY/ONCOLOGY HISTORY:  Oncology History   # MAY- June 2017- METASTATIC CLEAR CELL; LEFT RENAL CA/STAGE IV; [place]- G-3; INTERMEDIATE RISK;  [bil Pul lung nodules;incidental s/p Bx Dr.Byrnett/Dr.Oaks] s/p Cytoreductive nephrectomy; Dr.Brandon- [id number]; July 11th 2017 CT- Enlarging Lung nodules  # Aug 7th 2017- PAZOPANIB; STOP SEP 2017 [pancreatitis/poor tol]  # OCT 1 week- START NIVO q 2W x4; DEC 8th CT- "Progression"; Continue Opdivo;APRIL-MAY 2018- STABLE LUNG NODULES [stopped sec to severe cough; NO Pneumonitis]  # May 31st 2018- Cabo 57m/day; July 31st CT lung- improved lung nodules; Sep 1st week- cabo- 462mday [dose reduced sec to mult intol]; Oct 1st 2week-Cabo 2038may;   # NOV 15th 2018- CT chest- slight progression of lung nodules [poor  tol to higer doses];   # Nov 28th 2018- SUTENT 50 mg 2w-on  &1 w-OFF;  # MARCH 2019- Progression; start Axitinib 5 mg BID; July 2019- MIXED response; worsening/new skeletal lesions/stable to improved lung lesions; STOP Axitinib;   # Left hip s/p RT [mid-AUG 2019]  # AUG 12th- RE-START CABO 40  mg/day; STOP SEP 2019- sec to intol [abdominal pain even on 36m/day]; II opinion at [place] # OCT 23rd 2019- TORISEL IV q W x 12 treatments; May 28, 2019-progression of the lung nodules bone lesions.  # Jan 2020- 13th-Lenvima 12 mg.  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  MEDICATIONS:  Current Outpatient Medications  Medication Sig Dispense Refill  . albuterol (PROVENTIL HFA;VENTOLIN HFA) 108 (90 Base) MCG/ACT inhaler Inhale 2 puffs into the lungs every 6 (six) hours as needed for wheezing or shortness of breath. 1 Inhaler 2  . aspirin EC 81 MG tablet Take 81 mg by mouth daily.    . Aspirin-Salicylamide-Caffeine (BC HEADACHE POWDER PO) Take 1 packet by mouth daily.    . Marland Kitchentorvastatin (LIPITOR) 40 MG tablet Take 40 mg by mouth daily.    . cholecalciferol (VITAMIN D) 400 units TABS tablet Take 400 Units by mouth daily.    . clopidogrel (PLAVIX) 75 MG tablet Take 75 mg by mouth daily.    . Coenzyme Q10 (COQ10) 100 MG CAPS Take 1 capsule by mouth daily.    . DULoxetine (CYMBALTA) 30 MG capsule Take 2 capsules by mouth daily.    . fluticasone (FLONASE) 50 MCG/ACT nasal spray Place 2 sprays into both nostrils daily. 16 g 2  . Fluticasone-Salmeterol (ADVAIR DISKUS) 500-50 MCG/DOSE AEPB Inhale 1 puff into the lungs 2 (two) times daily. 60 each 11  . ipratropium-albuterol (DUONEB) 0.5-2.5 (3) MG/3ML SOLN Take 3 mLs by nebulization  every 4 (four) hours as needed. 360 mL 3  . Lenvatinib 12 mg daily dose (LENVIMA, 12 MG DAILY DOSE,) 3 x 4 MG capsule Take 12 mg by mouth daily. 90 capsule 4  . loperamide (IMODIUM) 1 MG/5ML solution Take 4 mg by mouth as needed for diarrhea or loose stools.    . Marland Kitchenoratadine (CLARITIN) 10 MG tablet Take 10 mg by mouth daily.    . Marland KitchenELATONIN PO Take 10 mg by mouth at bedtime.     . metFORMIN (GLUCOPHAGE) 500 MG tablet Take 1 tablet (500 mg total) by mouth 2 (two) times daily with a meal. 60 tablet 3  . Misc Natural Products (GLUCOSAMINE CHOND COMPLEX/MSM) TABS Take 2 tablets by mouth daily.    . montelukast (SINGULAIR) 10 MG tablet TAKE 1 TABLET BY MOUTH EVERYDAY AT BEDTIME 90 tablet 1  . nitroGLYCERIN (NITROSTAT) 0.4 MG SL tablet Place 0.4 mg under the tongue every 5 (five) minutes as needed.     . Omega-3 Fatty Acids (FISH OIL) 1000 MG CPDR Take 1 capsule by mouth daily.    . ondansetron (ZOFRAN) 8 MG tablet Take 1 tablet (8 mg total) by mouth every 8 (  eight) hours as needed for nausea or vomiting. 60 tablet 0  . OXYGEN Inhale 2 L into the lungs at bedtime.    . tamsulosin (FLOMAX) 0.4 MG CAPS capsule TAKE 1 CAPSULE (0.4 MG TOTAL) BY MOUTH EVERY MORNING. 30 capsule 3  . temazepam (RESTORIL) 7.5 MG capsule Take 1 capsule (7.5 mg total) by mouth at bedtime as needed for sleep. 30 capsule 0  . Tiotropium Bromide Monohydrate (SPIRIVA RESPIMAT) 1.25 MCG/ACT AERS Inhale 1.25 Act into the lungs 2 (two) times daily. 1 Inhaler 11  . tiZANidine (ZANAFLEX) 4 MG tablet Take 4 mg by mouth daily.     . Turmeric 500 MG TABS Take 1 tablet by mouth daily.     No current facility-administered medications for this visit.    Facility-Administered Medications Ordered in Other Visits  Medication Dose Route Frequency Provider Last Rate Last Dose  . 0.9 %  sodium chloride infusion   Intravenous Continuous Cammie Sickle, MD 10 mL/hr at 10/27/18 1054      VITAL SIGNS: There were no vitals taken for this  visit. There were no vitals filed for this visit.  Estimated body mass index is 24.28 kg/m as calculated from the following:   Height as of 12/17/18: '6\' 1"'  (1.854 m).   Weight as of an earlier encounter on 12/31/18: 184 lb (83.5 kg).  LABS: CBC:    Component Value Date/Time   WBC 4.4 12/31/2018 1343   HGB 13.5 12/31/2018 1343   HGB 13.9 05/25/2016 1514   HCT 40.2 12/31/2018 1343   HCT 38.7 05/25/2016 1514   PLT 130 (L) 12/31/2018 1343   PLT 177 05/25/2016 1514   MCV 88.5 12/31/2018 1343   MCV 93 05/25/2016 1514   NEUTROABS 3.0 12/31/2018 1343   LYMPHSABS 0.9 12/31/2018 1343   MONOABS 0.5 12/31/2018 1343   EOSABS 0.1 12/31/2018 1343   BASOSABS 0.0 12/31/2018 1343   Comprehensive Metabolic Panel:    Component Value Date/Time   NA 138 12/31/2018 1343   NA 146 (H) 05/25/2016 1514   K 4.0 12/31/2018 1343   CL 103 12/31/2018 1343   CO2 28 12/31/2018 1343   BUN 14 12/31/2018 1343   BUN 19 05/25/2016 1514   CREATININE 1.08 12/31/2018 1343   GLUCOSE 114 (H) 12/31/2018 1343   CALCIUM 8.9 12/31/2018 1343   AST 16 12/31/2018 1343   ALT 15 12/31/2018 1343   ALKPHOS 86 12/31/2018 1343   BILITOT 0.7 12/31/2018 1343   PROT 6.6 12/31/2018 1343   ALBUMIN 3.8 12/31/2018 1343    RADIOGRAPHIC STUDIES: No results found.  PERFORMANCE STATUS (ECOG) : 1 - Symptomatic but completely ambulatory  Review of Systems As noted above. Otherwise, a complete review of systems is negative.  Physical Exam General: NAD, frail appearing, thin Pulmonary: unlabored GU: no suprapubic tenderness Extremities: no edema Skin: no rashes Neurological: Weakness but otherwise nonfocal  IMPRESSION: I met today with patient and his daughter in the clinic for routine follow up.   Patient denies significant changes since he was last seen. His daughter agrees with this statement. Neither had any concerns today.   Patient continues to endorse persistent and chronic pain in the left shoulder. He was  previously taking hydromorphone tablets for pain but says he is running out. Will refill today. He has also been taking BC powder intermittently. Would recommend stopping BC powder. Could try ibuprofen and acetaminophen combo. Would also suggest daily PPI for gastric protection.   Patient says his sleep is greatly  improved on the temazepam.   PLAN: 1.  Treatment plan as outlined per oncology 2.  Continue temazepam for sleep 3.  Hydromorphone 63m PO Q4H prn for pain (Rx #45) 4.  Hold BC Powder. May try ibuprofen 4074m+ acetaminophen 50031mID 5.  Recommend omeprazole 29m69mily for gastric protection 6.  Prophylactic bowel regimen 7.  RTC in 2 weeks  Patient expressed understanding and was in agreement with this plan. He also understands that He can call clinic at any time with any questions, concerns, or complaints.   Time Total: 20 minutes  Visit consisted of counseling and education dealing with the complex and emotionally intense issues of symptom management and palliative care in the setting of serious and potentially life-threatening illness.Greater than 50%  of this time was spent counseling and coordinating care related to the above assessment and plan.  Signed by: JoshAltha HarmD, NP-C [phone] Cell)

## 2019-01-01 NOTE — Progress Notes (Signed)
DISCONTINUE ON PATHWAY REGIMEN - Renal Cell     Orally once daily:     Everolimus      Lenvatinib   **Always confirm dose/schedule in your pharmacy ordering system**  REASON: Toxicities / Adverse Event PRIOR TREATMENT: [id number]: Lenvatinib 18 mg Daily + Everolimus 5 mg Daily TREATMENT RESPONSE: Unable to Evaluate  START ON PATHWAY REGIMEN - Renal Cell     A cycle is every 14 days:     Nivolumab   **Always confirm dose/schedule in your pharmacy ordering system**  Patient Characteristics: Metastatic, Clear Cell, Third Line AJCC M Category: M1 AJCC 8 Stage Grouping: IV Current evidence of distant metastases<= Yes AJCC T Category: [place] AJCC N Category: NX Does patient have oligometastatic disease<= No Histology: Clear Cell Line of Therapy: Third Line Intent of Therapy: Non-Curative / Palliative Intent, Discussed with Patient

## 2019-01-07 DIAGNOSIS — E781 Pure hyperglyceridemia: Secondary | ICD-10-CM | POA: Diagnosis not present

## 2019-01-07 DIAGNOSIS — I10 Essential (primary) hypertension: Secondary | ICD-10-CM | POA: Diagnosis not present

## 2019-01-08 ENCOUNTER — Other Ambulatory Visit: Payer: Self-pay

## 2019-01-08 ENCOUNTER — Inpatient Hospital Stay (HOSPITAL_BASED_OUTPATIENT_CLINIC_OR_DEPARTMENT_OTHER): Payer: PPO | Admitting: Internal Medicine

## 2019-01-08 ENCOUNTER — Inpatient Hospital Stay (HOSPITAL_BASED_OUTPATIENT_CLINIC_OR_DEPARTMENT_OTHER): Payer: PPO | Admitting: Hospice and Palliative Medicine

## 2019-01-08 ENCOUNTER — Inpatient Hospital Stay: Payer: PPO

## 2019-01-08 ENCOUNTER — Other Ambulatory Visit: Payer: PPO

## 2019-01-08 VITALS — BP 160/88 | HR 75 | Temp 96.9°F | Resp 20 | Ht 73.0 in | Wt 184.0 lb

## 2019-01-08 DIAGNOSIS — G8929 Other chronic pain: Secondary | ICD-10-CM | POA: Diagnosis not present

## 2019-01-08 DIAGNOSIS — Z87442 Personal history of urinary calculi: Secondary | ICD-10-CM

## 2019-01-08 DIAGNOSIS — M25512 Pain in left shoulder: Secondary | ICD-10-CM

## 2019-01-08 DIAGNOSIS — R2242 Localized swelling, mass and lump, left lower limb: Secondary | ICD-10-CM | POA: Diagnosis not present

## 2019-01-08 DIAGNOSIS — Z5112 Encounter for antineoplastic immunotherapy: Secondary | ICD-10-CM

## 2019-01-08 DIAGNOSIS — I10 Essential (primary) hypertension: Secondary | ICD-10-CM | POA: Diagnosis not present

## 2019-01-08 DIAGNOSIS — Z7982 Long term (current) use of aspirin: Secondary | ICD-10-CM

## 2019-01-08 DIAGNOSIS — C7800 Secondary malignant neoplasm of unspecified lung: Secondary | ICD-10-CM

## 2019-01-08 DIAGNOSIS — Z9221 Personal history of antineoplastic chemotherapy: Secondary | ICD-10-CM

## 2019-01-08 DIAGNOSIS — J449 Chronic obstructive pulmonary disease, unspecified: Secondary | ICD-10-CM

## 2019-01-08 DIAGNOSIS — C642 Malignant neoplasm of left kidney, except renal pelvis: Secondary | ICD-10-CM

## 2019-01-08 DIAGNOSIS — Z7984 Long term (current) use of oral hypoglycemic drugs: Secondary | ICD-10-CM

## 2019-01-08 DIAGNOSIS — I2510 Atherosclerotic heart disease of native coronary artery without angina pectoris: Secondary | ICD-10-CM

## 2019-01-08 DIAGNOSIS — G893 Neoplasm related pain (acute) (chronic): Secondary | ICD-10-CM

## 2019-01-08 DIAGNOSIS — C7951 Secondary malignant neoplasm of bone: Secondary | ICD-10-CM

## 2019-01-08 DIAGNOSIS — Z7189 Other specified counseling: Secondary | ICD-10-CM

## 2019-01-08 DIAGNOSIS — Z85820 Personal history of malignant melanoma of skin: Secondary | ICD-10-CM

## 2019-01-08 DIAGNOSIS — Z515 Encounter for palliative care: Secondary | ICD-10-CM

## 2019-01-08 DIAGNOSIS — Z923 Personal history of irradiation: Secondary | ICD-10-CM | POA: Diagnosis not present

## 2019-01-08 DIAGNOSIS — Z79899 Other long term (current) drug therapy: Secondary | ICD-10-CM

## 2019-01-08 LAB — CBC WITH DIFFERENTIAL/PLATELET
Abs Immature Granulocytes: 0.02 10*3/uL (ref 0.00–0.07)
BASOS ABS: 0 10*3/uL (ref 0.0–0.1)
Basophils Relative: 0 %
Eosinophils Absolute: 0.1 10*3/uL (ref 0.0–0.5)
Eosinophils Relative: 1 %
HEMATOCRIT: 38.5 % — AB (ref 39.0–52.0)
Hemoglobin: 12.9 g/dL — ABNORMAL LOW (ref 13.0–17.0)
Immature Granulocytes: 0 %
LYMPHS ABS: 0.8 10*3/uL (ref 0.7–4.0)
LYMPHS PCT: 12 %
MCH: 30.2 pg (ref 26.0–34.0)
MCHC: 33.5 g/dL (ref 30.0–36.0)
MCV: 90.2 fL (ref 80.0–100.0)
Monocytes Absolute: 0.6 10*3/uL (ref 0.1–1.0)
Monocytes Relative: 9 %
Neutro Abs: 5.2 10*3/uL (ref 1.7–7.7)
Neutrophils Relative %: 78 %
Platelets: 168 10*3/uL (ref 150–400)
RBC: 4.27 MIL/uL (ref 4.22–5.81)
RDW: 18.5 % — ABNORMAL HIGH (ref 11.5–15.5)
WBC: 6.7 10*3/uL (ref 4.0–10.5)
nRBC: 0 % (ref 0.0–0.2)

## 2019-01-08 LAB — COMPREHENSIVE METABOLIC PANEL
ALT: 11 U/L (ref 0–44)
AST: 15 U/L (ref 15–41)
Albumin: 3.8 g/dL (ref 3.5–5.0)
Alkaline Phosphatase: 69 U/L (ref 38–126)
Anion gap: 7 (ref 5–15)
BUN: 12 mg/dL (ref 8–23)
CHLORIDE: 103 mmol/L (ref 98–111)
CO2: 28 mmol/L (ref 22–32)
CREATININE: 1.04 mg/dL (ref 0.61–1.24)
Calcium: 9 mg/dL (ref 8.9–10.3)
GFR calc Af Amer: >60 mL/min (ref >60)
GFR calc non Af Amer: >60 mL/min (ref >60)
Glucose, Bld: 104 mg/dL — ABNORMAL HIGH (ref 70–99)
Potassium: 3.8 mmol/L (ref 3.5–5.1)
Sodium: 138 mmol/L (ref 135–145)
Total Bilirubin: 0.7 mg/dL (ref 0.3–1.2)
Total Protein: 6.8 g/dL (ref 6.5–8.1)

## 2019-01-08 MED ORDER — DIPHENHYDRAMINE HCL 50 MG/ML IJ SOLN
25.0000 mg | Freq: Once | INTRAMUSCULAR | Status: AC
  Administered 2019-01-08: 25 mg via INTRAVENOUS
  Filled 2019-01-08: qty 1

## 2019-01-08 MED ORDER — FENTANYL 50 MCG/HR TD PT72: 1.0000 | MEDICATED_PATCH | TRANSDERMAL | 0 refills | Status: DC

## 2019-01-08 MED ORDER — HYDROMORPHONE HCL 2 MG PO TABS: 2.0000 mg | ORAL_TABLET | Freq: Four times a day (QID) | ORAL | 0 refills | Status: DC | PRN

## 2019-01-08 MED ORDER — SODIUM CHLORIDE 0.9 % IV SOLN
1.0000 mg/kg | Freq: Once | INTRAVENOUS | Status: AC
  Administered 2019-01-08: 85 mg via INTRAVENOUS
  Filled 2019-01-08: qty 17

## 2019-01-08 MED ORDER — SODIUM CHLORIDE 0.9 % IV SOLN
Freq: Once | INTRAVENOUS | Status: AC
  Administered 2019-01-08: 12:00:00 via INTRAVENOUS
  Filled 2019-01-08: qty 250

## 2019-01-08 MED ORDER — SODIUM CHLORIDE 0.9 % IV SOLN
240.0000 mg | Freq: Once | INTRAVENOUS | Status: AC
  Administered 2019-01-08: 240 mg via INTRAVENOUS
  Filled 2019-01-08: qty 24

## 2019-01-08 MED ORDER — FAMOTIDINE IN NACL 20-0.9 MG/50ML-% IV SOLN
20.0000 mg | Freq: Once | INTRAVENOUS | Status: AC
  Administered 2019-01-08: 20 mg via INTRAVENOUS
  Filled 2019-01-08: qty 50

## 2019-01-08 NOTE — Progress Notes (Signed)
[facility] OFF[facility] PROGRESS NOTE  Patient Care Team: Dion Body, MD as PCP - General (Family Medicine) Jannet Mantis, MD (Dermatology) Bary Castilla, Forest Gleason, MD (General Surgery) Hollice Espy, MD as Consulting Physician (Urology) Cammie Sickle, MD as Medical Oncologist (Medical Oncology) Isaias Cowman, MD as Consulting Physician (Cardiology) Harriette Bouillon, MD as Referring Physician (Cardiology)  Cancer Staging No matching staging information was found for the patient.   Oncology History   # MAY- June 2017- METASTATIC CLEAR CELL; LEFT RENAL CA/STAGE IV; [place]- G-3; INTERMEDIATE RISK;  [bil Pul lung nodules;incidental s/p Bx Dr.Byrnett/Dr.Oaks] s/p Cytoreductive nephrectomy; Dr.Brandon- [id number]; July 11th 2017 CT- Enlarging Lung nodules  # Aug 7th 2017- PAZOPANIB; STOP SEP 2017 [pancreatitis/poor tol]  # OCT 1 week- START NIVO q 2W x4; DEC 8th CT- "Progression"; Continue Opdivo;APRIL-MAY 2018- STABLE LUNG NODULES [stopped sec to severe cough; NO Pneumonitis]  # May 31st 2018- Cabo 62m/day; July 31st CT lung- improved lung nodules; Sep 1st week- cabo- 47mday [dose reduced sec to mult intol]; Oct 1st 2week-Cabo [id number];   # NOV 15th 2018- CT chest- slight progression of lung nodules [poor tol to higer doses];   # Nov 28th 2018- SUTENT 50 mg 2w-on  &1 w-OFF;  # MARCH 2019- Progression; start Axitinib 5 mg BID; July 2019- MIXED response; worsening/new skeletal lesions/stable to improved lung lesions; STOP Axitinib;   # Left hip s/p RT [mid-AUG 2019]  # AUG 12th- RE-START CABO 40 mg/day; STOP SEP 2019- sec to intol [abdominal pain even on 3m2my]; [facility] q W x 12 treatments; May 28, 2019-progression of the lung nodules bone lesions.  # Jan 2020- 13th-Lenvima 12 mg; disocn F3b 2020; clinical progression  # Feb 20th, 2020- IPI+NIVO  # March 2017-  Malignant melanoma of the  intrascapular area on the back ]right side];STAGE I  0.42 millimeter depth; s/p WLE [Dr.Byrnett]  ---------------------------------------------------------    # March 2019- Molecular testing- PDL-1 NEG; TMB-Low; MSI-STABLE; No targets; VHL mutation of  unknown significance  -----------------------------------------------------   # Dx; Kidney cancer/ clear cell STAGE IV  Current treatment: IPI+NIVO Goal: Palliative     [facility]   INTERVAL HISTORY:  [place].  male pleasant patient above history of metastatic renal cell cancer currently on lenvatinib is here for follow-up.  Patient complains of worsening pain in his left shoulder.  He is taking over-the-counter NSAIDs.  No significant pain control.  Denies any headaches but denies any nausea vomiting.  Continues to have a lump in the left shin.  Status post radiation.  Pain improved.  Review of Systems  Constitutional: Positive for malaise/fatigue. Negative for chills, diaphoresis and fever.  HENT: Negative for nosebleeds and sore throat.   Eyes: Negative for double vision.  Respiratory: Negative for cough, hemoptysis, sputum production, shortness of breath and wheezing.   Cardiovascular: Negative for chest pain, palpitations, orthopnea and leg swelling.  Gastrointestinal: Negative for blood in stool, constipation, heartburn, melena and vomiting.  Genitourinary: Negative for dysuria, frequency and urgency.  Musculoskeletal: Positive for back pain and joint pain (left shoulder pain).       Approximate 2 cm nodule noted on the left mid tibia.  Skin: Negative.  Negative for itching and rash.  Neurological: Negative for tingling, focal weakness and weakness.  Endo/Heme/Allergies: Does not bruise/bleed easily.  Psychiatric/Behavioral: Negative for depression. The patient is not nervous/anxious.  PAST MEDICAL HISTORY :  Past Medical History:  Diagnosis Date  . CAD  (coronary artery disease)   . Cancer ([name])    left arm  . COPD (chronic obstructive pulmonary disease) ([place])   . Hypertension   . Kidney stone   . Lung cancer ([name])   . Melanoma ([name]) 01/24/2016   right shoulder  . Thyroid nodule 02/02/2016   left BENIGN THYROID NODULE by FNA    PAST SURGICAL HISTORY :   Past Surgical History:  Procedure Laterality Date  . arm surgery Left    arm  . cardiac stents  2011   Angioplasty / Stenting Femoral-X2  . COLONOSCOPY  04/01/12  . CORONARY ANGIOPLASTY    . EXCISION MELANOMA WITH SENTINEL LYMPH NODE BIOPSY Right 01/24/2016   Procedure: EXCISION MELANOMA Right Shoulder;  Surgeon: Robert Bellow, MD;  Location: [facility] ORS;  Service: General;  Laterality: Right;  . LAPAROSCOPIC NEPHRECTOMY, HAND ASSISTED Left 04/24/2016   Procedure: HAND ASSISTED LAPAROSCOPIC NEPHRECTOMY;  Surgeon: Hollice Espy, MD;  Location: [facility] ORS;  Service: Urology;  Laterality: Left;  Marland Kitchen VIDEO ASSISTED THORACOSCOPY (VATS)/THOROCOTOMY Left 03/08/2016   Procedure: VIDEO ASSISTED THORACOSCOPY (VATS) with lung biopsy - Left ;  Surgeon: Robert Bellow, MD;  Location: [facility] ORS;  Service: General;  Laterality: Left;    FAMILY HISTORY :   Family History  Problem Relation Age of Onset  . Hodgkin's lymphoma Daughter 5  . Heart attack Father   . Kidney cancer Neg Hx   . Kidney disease Neg Hx   . Prostate cancer Neg Hx     SOCIAL HISTORY:   Social History   Tobacco Use  . Smoking status: Former Smoker    Packs/day: 2.00    Years: 30.00    Pack years: 60.00    Types: Cigarettes    Start date: 01/18/1956    Last attempt to quit: 01/17/1969    Years since quitting: 50.0  . Smokeless tobacco: Never Used  Substance Use Topics  . Alcohol use: No    Alcohol/week: 0.0 standard drinks    Comment: BEER OCC  . Drug use: No    ALLERGIES:  is allergic to lisinopril and other.  MEDICATIONS:  Current Outpatient Medications  Medication Sig Dispense Refill  . albuterol (PROVENTIL  HFA;VENTOLIN HFA) 108 (90 Base) MCG/ACT inhaler Inhale 2 puffs into the lungs every 6 (six) hours as needed for wheezing or shortness of breath. 1 Inhaler 2  . aspirin EC 81 MG tablet Take 81 mg by mouth daily.    . Aspirin-Salicylamide-Caffeine (BC HEADACHE POWDER PO) Take 1 packet by mouth daily.    Marland Kitchen atorvastatin (LIPITOR) 40 MG tablet Take 40 mg by mouth daily.    . cholecalciferol (VITAMIN D) 400 units TABS tablet Take 400 Units by mouth daily.    . clopidogrel (PLAVIX) 75 MG tablet Take 75 mg by mouth daily.    . Coenzyme Q10 (COQ10) 100 MG CAPS Take 1 capsule by mouth daily.    . DULoxetine (CYMBALTA) 30 MG capsule Take 2 capsules by mouth daily.    . fentaNYL (DURAGESIC) 50 MCG/HR Place 1 patch onto the skin every 3 (three) days. 10 patch 0  . fluticasone (FLONASE) 50 MCG/ACT nasal spray Place 2 sprays into both nostrils daily. 16 g 2  . Fluticasone-Salmeterol (ADVAIR DISKUS) 500-50 MCG/DOSE AEPB Inhale 1 puff into the lungs 2 (two) times daily. 60 each 11  . HYDROmorphone (DILAUDID) 2 MG tablet Take 1 tablet (2 mg total)  by mouth every 6 (six) hours as needed for moderate pain or severe pain. 60 tablet 0  . ipratropium-albuterol (DUONEB) 0.5-2.5 (3) MG/3ML SOLN Take 3 mLs by nebulization every 4 (four) hours as needed. 360 mL 3  . loperamide (IMODIUM) 1 MG/5ML solution Take 4 mg by mouth as needed for diarrhea or loose stools.    Marland Kitchen loratadine (CLARITIN) 10 MG tablet Take 10 mg by mouth daily.    Marland Kitchen MELATONIN PO Take 10 mg by mouth at bedtime.     . metFORMIN (GLUCOPHAGE) 500 MG tablet Take 1 tablet (500 mg total) by mouth 2 (two) times daily with a meal. 60 tablet 3  . Misc Natural Products (GLUCOSAMINE CHOND COMPLEX/MSM) TABS Take 2 tablets by mouth daily.    . montelukast (SINGULAIR) 10 MG tablet TAKE 1 TABLET BY MOUTH EVERYDAY AT BEDTIME 90 tablet 1  . nitroGLYCERIN (NITROSTAT) 0.4 MG SL tablet Place 0.4 mg under the tongue every 5 (five) minutes as needed.     . Omega-3 Fatty Acids  (FISH OIL) 1000 MG CPDR Take 1 capsule by mouth daily.    . ondansetron (ZOFRAN) 8 MG tablet Take 1 tablet (8 mg total) by mouth every 8 (eight) hours as needed for nausea or vomiting. 60 tablet 0  . OXYGEN Inhale 2 L into the lungs at bedtime.    . tamsulosin (FLOMAX) 0.4 MG CAPS capsule TAKE 1 CAPSULE (0.4 MG TOTAL) BY MOUTH EVERY MORNING. 30 capsule 3  . temazepam (RESTORIL) 7.5 MG capsule Take 1 capsule (7.5 mg total) by mouth at bedtime as needed for sleep. 30 capsule 0  . Tiotropium Bromide Monohydrate (SPIRIVA RESPIMAT) 1.25 MCG/ACT AERS Inhale 1.25 Act into the lungs 2 (two) times daily. 1 Inhaler 11  . tiZANidine (ZANAFLEX) 4 MG tablet Take 4 mg by mouth daily.     . Turmeric 500 MG TABS Take 1 tablet by mouth daily.     No current facility-administered medications for this visit.    Facility-Administered Medications Ordered in Other Visits  Medication Dose Route Frequency Provider Last Rate Last Dose  . 0.9 %  sodium chloride infusion   Intravenous Continuous Cammie Sickle, MD 10 mL/hr at 10/27/18 1054      PHYSICAL EXAMINATION: ECOG PERFORMANCE STATUS: 1 - Symptomatic but completely ambulatory  BP (!) 160/88 (BP Location: Left Arm, Patient Position: Sitting)   Pulse 75   Temp (!) 96.9 F (36.1 C) (Tympanic)   Resp 20   Ht '6\' 1"'  (1.854 m)   Wt 184 lb (83.5 kg)   BMI 24.28 kg/m   Filed Weights   01/08/19 1058  Weight: 184 lb (83.5 kg)    Physical Exam  Constitutional: He is oriented to person, place, and time and well-developed, well-nourished, and in no distress.  Accompanied by his wife.Marland Kitchen He is walking by himself.  HENT:  Head: Normocephalic and atraumatic.  Mouth/Throat: Oropharynx is clear and moist. No oropharyngeal exudate.  Eyes: Pupils are equal, round, and reactive to light.  Neck: Normal range of motion. Neck supple.  Cardiovascular: Normal rate and regular rhythm.  Pulmonary/Chest: No respiratory distress. He has no wheezes.  Abdominal: Soft.  Bowel sounds are normal. He exhibits no distension and no mass. There is no guarding.  Musculoskeletal:        General: No tenderness or edema.     Comments: Left leg shin nodule swelling-getting radiation.  Neurological: He is alert and oriented to person, place, and time.  Skin: Skin is warm.  Psychiatric: Affect normal.       LABORATORY DATA:  I have reviewed the data as listed    Component Value Date/Time   NA 138 01/08/2019 1007   NA 146 (H) 05/25/2016 1514   K 3.8 01/08/2019 1007   CL 103 01/08/2019 1007   CO2 28 01/08/2019 1007   GLUCOSE 104 (H) 01/08/2019 1007   BUN 12 01/08/2019 1007   BUN 19 05/25/2016 1514   CREATININE 1.04 01/08/2019 1007   CALCIUM 9.0 01/08/2019 1007   PROT 6.8 01/08/2019 1007   ALBUMIN 3.8 01/08/2019 1007   AST 15 01/08/2019 1007   ALT 11 01/08/2019 1007   ALKPHOS 69 01/08/2019 1007   BILITOT 0.7 01/08/2019 1007   GFRNONAA >60 01/08/2019 1007   GFRAA >60 01/08/2019 1007    No results found for: SPEP, UPEP  Lab Results  Component Value Date   WBC 6.7 01/08/2019   NEUTROABS 5.2 01/08/2019   HGB 12.9 (L) 01/08/2019   HCT 38.5 (L) 01/08/2019   MCV 90.2 01/08/2019   PLT 168 01/08/2019      Chemistry      Component Value Date/Time   NA 138 01/08/2019 1007   NA 146 (H) 05/25/2016 1514   K 3.8 01/08/2019 1007   CL 103 01/08/2019 1007   CO2 28 01/08/2019 1007   BUN 12 01/08/2019 1007   BUN 19 05/25/2016 1514   CREATININE 1.04 01/08/2019 1007      Component Value Date/Time   CALCIUM 9.0 01/08/2019 1007   ALKPHOS 69 01/08/2019 1007   AST 15 01/08/2019 1007   ALT 11 01/08/2019 1007   BILITOT 0.7 01/08/2019 1007       RADIOGRAPHIC STUDIES: I have personally reviewed the radiological images as listed and agreed with the findings in the report. No results found.   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   # proceed with Ipi [1]+ nivo [3] q 3 W; proceed with cycle #1 today. Labs today reviewed;  acceptable for treatment today.   I again reviewed that the goal of therapy is palliative; and length of treatments are likely ongoing/based upon the results of the scans. Discussed the potential side effects of immunotherapy including but not limited to diarrhea; skin rash; elevated LFTs/endocrine abnormalities etc.  # Left shoulder pain/ left leg pain- re-start fenatnyl patch every 3 days.  New refill for Dilaudid given.  Continue Xgeva.  #Again reviewed the prognosis with the patient and wife; very poor ~life expectancy the order of few to many months based upon response to chemotherapy.  # DISPOSITION: # treatment today # follow up in 3 weeks- MD/labs;cbc/cmp;IPI+NIVO/ X-geva- Dr.B   No orders of the defined types were placed in this encounter.  All questions were answered. The patient knows to call the clinic with any problems, questions or concerns.      Cammie Sickle, MD 01/08/2019 8:05 PM

## 2019-01-08 NOTE — Assessment & Plan Note (Addendum)
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   # proceed with Ipi [1]+ nivo [3] q 3 W; proceed with cycle #1 today. Labs today reviewed;  acceptable for treatment today.   I again reviewed that the goal of therapy is palliative; and length of treatments are likely ongoing/based upon the results of the scans. Discussed the potential side effects of immunotherapy including but not limited to diarrhea; skin rash; elevated LFTs/endocrine abnormalities etc.  # Left shoulder pain/ left leg pain- re-start fenatnyl patch every 3 days.  New refill for Dilaudid given.  Continue Xgeva.  #Again reviewed the prognosis with the patient and wife; very poor ~life expectancy the order of few to many months based upon response to chemotherapy.  # DISPOSITION: # treatment today # follow up in 3 weeks- MD/labs;cbc/cmp;IPI+NIVO/ Jayme [place]- Dr.B

## 2019-01-08 NOTE — Progress Notes (Signed)
[facility]  Telephone:([phone] [phone]   Name: Glen Davis Date: 01/08/2019 MRN: [id number]  DOB: [birth year]  Patient Care Team: Dion Body, MD as PCP - General (Family Medicine) Jannet Mantis, MD (Dermatology) Bary Castilla, Forest Gleason, MD (General Surgery) Hollice Espy, MD as Consulting Physician (Urology) Cammie Sickle, MD as Medical Oncologist (Medical Oncology) Isaias Cowman, MD as Consulting Physician (Cardiology) Harriette Bouillon, MD as Referring Physician (Cardiology)    REASON FOR CONSULTATION: Palliative Care consult requested for this 78 y.o. 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  PMH also notable for CAD, COPD, and hypertension.  Patient had skeletal bone survey in January 2020 revealing lytic lesions to the left scapula, left iliac bone, left tibia, and left fibula.  He was referred to [facility] for RT.  Patient is referred to palliative care to help with symptoms and to address goals.  SOCIAL HISTORY:    Patient is married and lives at home with his wife. He has a daughter and son, both of whom live nearby. Patient is a retired Programmer, systems.   ADVANCE DIRECTIVES:  [place] POA and living will documents on file dated 03/29/2016.  His healthcare power of attorney is Siddarth Hsiung.  CODE STATUS: DNR  PAST MEDICAL HISTORY: Past Medical History:  Diagnosis Date  . CAD (coronary artery disease)   . Cancer ([place])    left arm  . COPD (chronic obstructive pulmonary disease) ([place])   . Hypertension   . Kidney stone   . Lung cancer ([place])   . Melanoma ([place]) 01/24/2016   right shoulder  . Thyroid nodule 02/02/2016   left BENIGN THYROID NODULE by FNA    PAST  SURGICAL HISTORY:  Past Surgical History:  Procedure Laterality Date  . arm surgery Left    arm  . cardiac stents  2011   Angioplasty / Stenting Femoral-X2  . COLONOSCOPY  04/01/12  . CORONARY ANGIOPLASTY    . EXCISION MELANOMA WITH SENTINEL LYMPH NODE BIOPSY Right 01/24/2016   Procedure: EXCISION MELANOMA Right Shoulder;  Surgeon: Robert Bellow, MD;  Location: [facility] ORS;  Service: General;  Laterality: Right;  . LAPAROSCOPIC NEPHRECTOMY, HAND ASSISTED Left 04/24/2016   Procedure: HAND ASSISTED LAPAROSCOPIC NEPHRECTOMY;  Surgeon: Hollice Espy, MD;  Location: [facility] ORS;  Service: Urology;  Laterality: Left;  Marland Kitchen VIDEO ASSISTED THORACOSCOPY (VATS)/THOROCOTOMY Left 03/08/2016   Procedure: VIDEO ASSISTED THORACOSCOPY (VATS) with lung biopsy - Left ;  Surgeon: Robert Bellow, MD;  Location: [facility] ORS;  Service: General;  Laterality: Left;    HEMATOLOGY/ONCOLOGY HISTORY:  Oncology History   # MAY- June 2017- METASTATIC CLEAR CELL; LEFT RENAL CA/STAGE IV; Furhman- G-3; INTERMEDIATE RISK;  [bil Pul lung nodules;incidental s/p Bx Dr.Byrnett/Dr.Oaks] s/p Cytoreductive nephrectomy; Dr.Brandon- [id number]; July 11th 2017 CT- Enlarging Lung nodules  # Aug 7th 2017- PAZOPANIB; STOP SEP 2017 [pancreatitis/poor tol]  # OCT 1 week- START NIVO q 2W x4; DEC 8th CT- "Progression"; Continue Opdivo;APRIL-MAY 2018- STABLE LUNG NODULES [stopped sec to severe cough; NO Pneumonitis]  # May 31st 2018- Cabo 71m/day; July 31st CT lung- improved lung nodules; Sep 1st week- cabo- 470mday [dose reduced sec to mult intol]; Oct 1st 2week-Cabo 2049may;   # NOV 15th 2018- CT chest- slight progression of lung nodules [poor  tol to higer doses];   # Nov 28th 2018- SUTENT 50 mg 2w-on  &1 w-OFF;  # MARCH 2019- Progression; start Axitinib 5 mg BID; July 2019- MIXED response; worsening/new skeletal lesions/stable to improved lung lesions; STOP Axitinib;   # Left hip s/p RT [mid-AUG 2019]  # AUG 12th- RE-START CABO 40  mg/day; STOP SEP 2019- sec to intol [abdominal pain even on 65m/day]; II opinion at [facility] # OCT 23rd 2019- TORISEL IV q W x 12 treatments; May 28, 2019-progression of the lung nodules bone lesions.  # Jan 2020- 13th-Lenvima 12 mg.  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];STAGE I  0.42 millimeter depth; s/p WLE [Dr.Byrnett]  ---------------------------------------------------------    # March 2019- Molecular testing- PDL-1 NEG; TMB-Low; MSI-STABLE; No targets; VHL mutation of  unknown significance  -----------------------------------------------------   # Dx; Kidney cancer/ clear cell STAGE IV  Current treatment: LMichel Santee159mday [jan 13th 2020]  Goal: Palliative     [place]  01[place] -  Chemotherapy    The patient had ipilimumab (YERVOY) 85 mg in sodium chloride 0.9 % 50 mL chemo infusion, 1 mg/kg = 85 mg, Intravenous,  Once, 1 of 4 cycles Administration: 85 mg (01/08/2019) nivolumab (OPDIVO) 240 mg in sodium chloride 0.9 % 100 mL chemo infusion, 251 mg, Intravenous, Once, 1 of 8 cycles Administration: 240 mg (01/08/2019)  for chemotherapy treatment.      ALLERGIES:  is allergic to lisinopril and other.  MEDICATIONS:  Current Outpatient Medications  Medication Sig Dispense Refill  . albuterol (PROVENTIL HFA;VENTOLIN HFA) 108 (90 Base) MCG/ACT inhaler Inhale 2 puffs into the lungs every 6 (six) hours as needed for wheezing or shortness of breath. 1 Inhaler 2  . aspirin EC 81 MG tablet Take 81 mg by mouth daily.    . Aspirin-Salicylamide-Caffeine (BC HEADACHE POWDER PO) Take 1 packet by mouth daily.    . Marland Kitchentorvastatin (LIPITOR) 40 MG tablet Take 40 mg by mouth daily.    . cholecalciferol (VITAMIN D) 400 units TABS tablet Take 400 Units by mouth daily.    . clopidogrel (PLAVIX) 75 MG tablet Take 75 mg by mouth daily.    . Coenzyme Q10 (COQ10) 100 MG CAPS Take 1 capsule by mouth  daily.    . DULoxetine (CYMBALTA) 30 MG capsule Take 2 capsules by mouth daily.    . fentaNYL (DURAGESIC) 50 MCG/HR Place 1 patch onto the skin every 3 (three) days. 10 patch 0  . fluticasone (FLONASE) 50 MCG/ACT nasal spray Place 2 sprays into both nostrils daily. 16 g 2  . Fluticasone-Salmeterol (ADVAIR DISKUS) 500-50 MCG/DOSE AEPB Inhale 1 puff into the lungs 2 (two) times daily. 60 each 11  . HYDROmorphone (DILAUDID) 2 MG tablet Take 1 tablet (2 mg total) by mouth every 6 (six) hours as needed for moderate pain or severe pain. 60 tablet 0  . ipratropium-albuterol (DUONEB) 0.5-2.5 (3) MG/3ML SOLN Take 3 mLs by nebulization every 4 (four) hours as needed. 360 mL 3  . loperamide (IMODIUM) 1 MG/5ML solution Take 4 mg by mouth as needed for diarrhea or loose stools.    . Marland Kitchenoratadine (CLARITIN) 10 MG tablet Take 10 mg by mouth daily.    . Marland KitchenELATONIN PO Take 10 mg by mouth at bedtime.     . metFORMIN (GLUCOPHAGE) 500 MG tablet Take 1 tablet (500 mg total) by mouth 2 (two) times daily with a meal. 60  tablet 3  . Misc Natural Products (GLUCOSAMINE CHOND COMPLEX/MSM) TABS Take 2 tablets by mouth daily.    . montelukast (SINGULAIR) 10 MG tablet TAKE 1 TABLET BY MOUTH EVERYDAY AT BEDTIME 90 tablet 1  . nitroGLYCERIN (NITROSTAT) 0.4 MG SL tablet Place 0.4 mg under the tongue every 5 (five) minutes as needed.     . Omega-3 Fatty Acids (FISH OIL) 1000 MG CPDR Take 1 capsule by mouth daily.    . ondansetron (ZOFRAN) 8 MG tablet Take 1 tablet (8 mg total) by mouth every 8 (eight) hours as needed for nausea or vomiting. 60 tablet 0  . OXYGEN Inhale 2 L into the lungs at bedtime.    . tamsulosin (FLOMAX) 0.4 MG CAPS capsule TAKE 1 CAPSULE (0.4 MG TOTAL) BY MOUTH EVERY MORNING. 30 capsule 3  . temazepam (RESTORIL) 7.5 MG capsule Take 1 capsule (7.5 mg total) by mouth at bedtime as needed for sleep. 30 capsule 0  . Tiotropium Bromide Monohydrate (SPIRIVA RESPIMAT) 1.25 MCG/ACT AERS Inhale 1.25 Act into the lungs 2  (two) times daily. 1 Inhaler 11  . tiZANidine (ZANAFLEX) 4 MG tablet Take 4 mg by mouth daily.     . Turmeric 500 MG TABS Take 1 tablet by mouth daily.     No current facility-administered medications for this visit.    Facility-Administered Medications Ordered in Other Visits  Medication Dose Route Frequency Provider Last Rate Last Dose  . 0.9 %  sodium chloride infusion   Intravenous Continuous Cammie Sickle, MD 10 mL/hr at 10/27/18 1054      VITAL SIGNS: There were no vitals taken for this visit. There were no vitals filed for this visit.  Estimated body mass index is 24.28 kg/m as calculated from the following:   Height as of an earlier encounter on 01/08/19: '6\' 1"'  (1.854 m).   Weight as of an earlier encounter on 01/08/19: 184 lb (83.5 kg).  LABS: CBC:    Component Value Date/Time   WBC 6.7 01/08/2019 1007   HGB 12.9 (L) 01/08/2019 1007   HGB 13.9 05/25/2016 1514   HCT 38.5 (L) 01/08/2019 1007   HCT 38.7 05/25/2016 1514   PLT 168 01/08/2019 1007   PLT 177 05/25/2016 1514   MCV 90.2 01/08/2019 1007   MCV 93 05/25/2016 1514   NEUTROABS 5.2 01/08/2019 1007   LYMPHSABS 0.8 01/08/2019 1007   MONOABS 0.6 01/08/2019 1007   EOSABS 0.1 01/08/2019 1007   BASOSABS 0.0 01/08/2019 1007   Comprehensive Metabolic Panel:    Component Value Date/Time   NA 138 01/08/2019 1007   NA 146 (H) 05/25/2016 1514   K 3.8 01/08/2019 1007   CL 103 01/08/2019 1007   CO2 28 01/08/2019 1007   BUN 12 01/08/2019 1007   BUN 19 05/25/2016 1514   CREATININE 1.04 01/08/2019 1007   GLUCOSE 104 (H) 01/08/2019 1007   CALCIUM 9.0 01/08/2019 1007   AST 15 01/08/2019 1007   ALT 11 01/08/2019 1007   ALKPHOS 69 01/08/2019 1007   BILITOT 0.7 01/08/2019 1007   PROT 6.8 01/08/2019 1007   ALBUMIN 3.8 01/08/2019 1007    RADIOGRAPHIC STUDIES: No results found.  PERFORMANCE STATUS (ECOG) : 1 - Symptomatic but completely ambulatory  Review of Systems As noted above. Otherwise, a complete review  of systems is negative.  Physical Exam General: NAD, frail appearing, thin Pulmonary: unlabored GU: no suprapubic tenderness Extremities: trace LLE edema.  Skin: no rashes Neurological: Weakness but otherwise nonfocal  IMPRESSION: I  met today with patient and his wife in the infusion area.   Patient saw Dr. Rogue Bussing today.  Patient was restarted on transdermal fentanyl at 50 mcg every 72 hours.  Patient continues to take hydromorphone 2 mg every 4 hours as needed but cannot tell much of an effect.  He is taken 2 tablets on occasion and says that works better.  We discussed increasing hydromorphone to 2 to 4 mg every 4 hours as needed.  Patient denies constipation.  He says his appetite is good and has been gaining weight.  Patient's wife had no concerns.  PLAN: 1.  Continue current scope of treatment 2.  Continue temazepam for sleep 3.  Hydromorphone 2 to 4 mg PO Q4H prn for pain 4.  Fentanyl patch 50 mcg every 72 hours 5.  Prophylactic bowel regimen 6.  RTC in 3 weeks  Patient expressed understanding and was in agreement with this plan. He also understands that He can call clinic at any time with any questions, concerns, or complaints.   Time Total: 15 minutes  Visit consisted of counseling and education dealing with the complex and emotionally intense issues of symptom management and palliative care in the setting of serious and potentially life-threatening illness.Greater than 50%  of this time was spent counseling and coordinating care related to the above assessment and plan.  Signed by: Altha Harm, PhD, NP-C [phone] (Work Cell)

## 2019-01-12 ENCOUNTER — Encounter: Payer: Self-pay | Admitting: Internal Medicine

## 2019-01-14 ENCOUNTER — Ambulatory Visit: Payer: PPO | Admitting: Hospice and Palliative Medicine

## 2019-01-14 DIAGNOSIS — F418 Other specified anxiety disorders: Secondary | ICD-10-CM | POA: Diagnosis not present

## 2019-01-14 DIAGNOSIS — F330 Major depressive disorder, recurrent, mild: Secondary | ICD-10-CM | POA: Diagnosis not present

## 2019-01-14 DIAGNOSIS — C7800 Secondary malignant neoplasm of unspecified lung: Secondary | ICD-10-CM | POA: Diagnosis not present

## 2019-01-14 DIAGNOSIS — I10 Essential (primary) hypertension: Secondary | ICD-10-CM | POA: Diagnosis not present

## 2019-01-14 DIAGNOSIS — J430 Unilateral pulmonary emphysema [MacLeod's syndrome]: Secondary | ICD-10-CM | POA: Diagnosis not present

## 2019-01-14 DIAGNOSIS — E781 Pure hyperglyceridemia: Secondary | ICD-10-CM | POA: Diagnosis not present

## 2019-01-14 DIAGNOSIS — C642 Malignant neoplasm of left kidney, except renal pelvis: Secondary | ICD-10-CM | POA: Diagnosis not present

## 2019-01-15 ENCOUNTER — Other Ambulatory Visit: Payer: Self-pay

## 2019-01-15 ENCOUNTER — Ambulatory Visit: Payer: PPO | Admitting: General Surgery

## 2019-01-15 ENCOUNTER — Inpatient Hospital Stay (HOSPITAL_BASED_OUTPATIENT_CLINIC_OR_DEPARTMENT_OTHER): Payer: PPO | Admitting: Oncology

## 2019-01-15 ENCOUNTER — Other Ambulatory Visit: Payer: Self-pay | Admitting: *Deleted

## 2019-01-15 ENCOUNTER — Encounter: Payer: Self-pay | Admitting: Internal Medicine

## 2019-01-15 ENCOUNTER — Encounter: Payer: Self-pay | Admitting: Oncology

## 2019-01-15 ENCOUNTER — Ambulatory Visit
Admission: RE | Admit: 2019-01-15 | Discharge: 2019-01-15 | Disposition: A | Discharge status: Home / Self Care | Payer: PPO | Source: Ambulatory Visit | Attending: Oncology | Admitting: Oncology

## 2019-01-15 ENCOUNTER — Ambulatory Visit
Admission: RE | Admit: 2019-01-15 | Discharge: 2019-01-15 | Disposition: A | Discharge status: Home / Self Care | Payer: PPO | Attending: Oncology | Admitting: Oncology

## 2019-01-15 VITALS — BP 134/78 | HR 84 | Temp 98.4°F | Resp 18

## 2019-01-15 DIAGNOSIS — Z7984 Long term (current) use of oral hypoglycemic drugs: Secondary | ICD-10-CM

## 2019-01-15 DIAGNOSIS — R05 Cough: Secondary | ICD-10-CM | POA: Diagnosis not present

## 2019-01-15 DIAGNOSIS — G8929 Other chronic pain: Secondary | ICD-10-CM | POA: Diagnosis not present

## 2019-01-15 DIAGNOSIS — C7800 Secondary malignant neoplasm of unspecified lung: Secondary | ICD-10-CM

## 2019-01-15 DIAGNOSIS — R059 Cough, unspecified: Secondary | ICD-10-CM

## 2019-01-15 DIAGNOSIS — M25512 Pain in left shoulder: Secondary | ICD-10-CM

## 2019-01-15 DIAGNOSIS — I10 Essential (primary) hypertension: Secondary | ICD-10-CM

## 2019-01-15 DIAGNOSIS — Z87891 Personal history of nicotine dependence: Secondary | ICD-10-CM

## 2019-01-15 DIAGNOSIS — C7951 Secondary malignant neoplasm of bone: Secondary | ICD-10-CM | POA: Diagnosis not present

## 2019-01-15 DIAGNOSIS — C642 Malignant neoplasm of left kidney, except renal pelvis: Secondary | ICD-10-CM

## 2019-01-15 DIAGNOSIS — Z7982 Long term (current) use of aspirin: Secondary | ICD-10-CM

## 2019-01-15 DIAGNOSIS — Z9221 Personal history of antineoplastic chemotherapy: Secondary | ICD-10-CM | POA: Diagnosis not present

## 2019-01-15 DIAGNOSIS — R2242 Localized swelling, mass and lump, left lower limb: Secondary | ICD-10-CM

## 2019-01-15 DIAGNOSIS — Z5112 Encounter for antineoplastic immunotherapy: Secondary | ICD-10-CM | POA: Diagnosis not present

## 2019-01-15 DIAGNOSIS — Z87442 Personal history of urinary calculi: Secondary | ICD-10-CM

## 2019-01-15 DIAGNOSIS — I2510 Atherosclerotic heart disease of native coronary artery without angina pectoris: Secondary | ICD-10-CM | POA: Diagnosis not present

## 2019-01-15 DIAGNOSIS — Z79899 Other long term (current) drug therapy: Secondary | ICD-10-CM

## 2019-01-15 DIAGNOSIS — Z85820 Personal history of malignant melanoma of skin: Secondary | ICD-10-CM

## 2019-01-15 DIAGNOSIS — J449 Chronic obstructive pulmonary disease, unspecified: Secondary | ICD-10-CM

## 2019-01-15 DIAGNOSIS — Z923 Personal history of irradiation: Secondary | ICD-10-CM

## 2019-01-15 LAB — INFLUENZA PANEL BY PCR (TYPE A & B)
Influenza A By PCR: NEGATIVE
Influenza B By PCR: NEGATIVE

## 2019-01-15 MED ORDER — PREDNISONE 10 MG (21) PO TBPK: ORAL_TABLET | ORAL | 0 refills | Status: DC

## 2019-01-15 MED ORDER — LEVOFLOXACIN 500 MG PO TABS: 500.0000 mg | ORAL_TABLET | Freq: Every day | ORAL | 0 refills | Status: DC

## 2019-01-15 MED ORDER — HYDROCOD POLST-CPM POLST ER 10-8 MG/5ML PO SUER: 5.0000 mL | Freq: Two times a day (BID) | ORAL | 0 refills | Status: DC | PRN

## 2019-01-15 NOTE — Progress Notes (Signed)
Symptom Management Consult note [facility]  Telephone:([phone] [phone]  Patient Care Team: Dion Body, MD as PCP - General (Family Medicine) Jannet Mantis, MD (Dermatology) Bary Castilla, Forest Gleason, MD (General Surgery) Hollice Espy, MD as Consulting Physician (Urology) Cammie Sickle, MD as Medical Oncologist (Medical Oncology) Isaias Cowman, MD as Consulting Physician (Cardiology) Harriette Bouillon, MD as Referring Physician (Cardiology)   Name of the patient: Hideo Googe  [id number]  [birth year]   Date of visit: 01/15/2019  Diagnosis: Left kidney cancer with metastatic disease to the lung.  Chief Complaint: Cough  Current Treatment: S/p cycle 1 Opdivo.  Last given on 01/08/2019.  Therapy switched d/t POD.  Oncology History: Patient evaluated by Dr. Rogue Bussing and Merrily Pew borders, NP on 01/08/2019 for assessment and discussion of goals of care. S/p cycle 1 day 1 Opdivo.  Recently completed 11 cycles of Torisel and discontinued d/t POD.  Bone scan from 12/01/2018 known lytic lesions in left scapula, iliac bone and tibia/fibula.  CT chest revealed POD with extensive pulmonary metastatic disease, enhancing nodularity of the adrenal glands bilaterally and peritoneal nodule along the ascending colon.  Oncology History   # MAY- June 2017- METASTATIC CLEAR CELL; LEFT RENAL CA/STAGE IV; Furhman- G-3; INTERMEDIATE RISK;  [bil Pul lung nodules;incidental s/p Bx Dr.Byrnett/Dr.Oaks] s/p Cytoreductive nephrectomy; Dr.Brandon- [id number]; July 11th 2017 CT- Enlarging Lung nodules  # Aug 7th 2017- PAZOPANIB; STOP SEP 2017 [pancreatitis/poor tol]  # OCT 1 week- START NIVO q 2W x4; DEC 8th CT- "Progression"; Continue Opdivo;APRIL-MAY 2018- STABLE LUNG NODULES [stopped sec to severe cough; NO Pneumonitis]  # May 31st 2018- Cabo 56m/day; July 31st CT lung- improved lung nodules; Sep 1st week- cabo- 418mday [dose reduced sec to mult intol];  Oct 1st 2week-Cabo 2034may;   # NOV 15th 2018- CT chest- slight progression of lung nodules [poor tol to higer doses];   # Nov 28th 2018- SUTENT 50 mg 2w-on  &1 w-OFF;  # MARCH 2019- Progression; start Axitinib 5 mg BID; July 2019- MIXED response; worsening/new skeletal lesions/stable to improved lung lesions; STOP Axitinib;   # Left hip s/p RT [mid-AUG 2019]  # AUG 12th- RE-START CABO 40 mg/day; STOP SEP 2019- sec to intol [abdominal pain even on 54m51my]; [place] IV q W x 12 treatments; May 28, 2019-progression of the lung nodules bone lesions.  # Jan 2020- 13th-Lenvima 12 mg; disocn F3b 2020; clinical progression  # Feb 20th, 2020- IPI+NIVO  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];STAGE I  0.42 millimeter depth; s/p WLE [Dr.Byrnett]  ---------------------------------------------------------    # March 2019- Molecular testing- PDL-1 NEG; TMB-Low; MSI-STABLE; No targets; VHL mutation of  unknown significance  -----------------------------------------------------   # Dx; Kidney cancer/ clear cell STAGE IV  Current treatment: IPI+NIVO Goal: Palliative     Cancer of left kidney excluding renal pelvis (HCC)    Subjective Data:   ECOG: 1 - Symptomatic but completely ambulatory   Subjective:     Glen LUDINGTONa 77 y41. male here for evaluation of a cough. Onset of symptoms was 2 week ago. Symptoms have been gradually worsening since that time. The cough is productive of green sputum and is aggravated by reclining position. Associated symptoms include: shortness of breath and wheezing. Patient has cancer with mets to lung. Patient does not have a history of environmental allergens.  His wife was recently diagnosed and  treated for the flu.  Currently hospitalized being treated for pneumonia.  Has has taken OTC cough medicine for cough without relief.  Has not taken any additional OTC  medication.  States "I only take what I am  prescribed".  The following portions of the patient's history were reviewed and updated as appropriate: allergies, current medications, past family history, past medical history, past social history, past surgical history and problem list.  Review of Systems A comprehensive review of systems was negative except for: Respiratory: positive for cough, sputum and wheezing Musculoskeletal: positive for muscle weakness and myalgias Neurological: positive for weakness    Objective:    Oxygen saturation 95% on room air BP 134/78 (BP Location: Right Arm, Patient Position: Sitting)   Pulse 84   Temp 98.4 F (36.9 C) (Tympanic)   Resp 18   SpO2 95%  General appearance: alert, fatigued and no distress Head: Normocephalic, without obvious abnormality, atraumatic Ears: normal TM's and external ear canals both ears Lungs: diminished breath sounds LLL and wheezes LLL    Assessment:    Acute Bronchitis    Plan:   Patient presents to [facility] for cough that is worsened over the course of the past 2 weeks.  He denies fever.  Had flu vaccine. wife recently diagnosed with the flu/pneumonia and treated.  She is currently hospitalized.  Metastatic Renal cell carcinoma: S/p cycle 1 Opdivo.  Had imaging recently noting POD.  Previously on Torisel.  Scheduled to return to clinic on 01/29/19 for consideration of cycle 2 Opdivo.   Cough/Bronchitis: Wheezing auscultated left lower lobe.  Afebrile. VSS. Given wife recently diagnosed with the flu and now pneumonia, will get imaging and flu swab (see plan below).    Plan: Stat chest x-ray.  Negative. Stat influenza swab.  Negative. Wheezing LLL.  New medications: RX Tussionex every 12 hours for cough. RX Levaquin 500 mg daily x10 days. Rx prednisone tablet pack X 6 days. OTC Tylenol for fever. Recommend he begin using prescribed inhalers.  Not currently taking.   Disposition: Return to clinic as scheduled to see  Dr. Rogue Bussing on 01/29/2019 for consideration of cycle 2. Education provided on red flag symptoms including fever unresolved with Tylenol.  We will touch base with patient tomorrow to see if any improvement is noted. Stressed the importance of hydration.  Provided samples of Ensure hydration. If symptoms persist, this could be d/t known enlarging pulmonary metastasis.  Greater than 50% was spent in counseling and coordination of care with this patient including but not limited to discussion of the relevant topics above (See A&P) including, but not limited to diagnosis and management of acute and chronic medical conditions.   Faythe Casa, NP 01/16/2019 1:48 PM

## 2019-01-15 NOTE — Patient Instructions (Signed)
It was nice to see you today.  I am so sorry you are not feeling well.  Your chest x-ray was negative for any acute abnormalities.  I have called you and 3 prescriptions.  They are Levaquin 500 mg daily, prednisone tablet pack (please start today), and Tussionex for cough.  I recommend taking the Tussionex at night to begin with.  It may make you feel sleepy.  If you have a fever please take Tylenol.  If fever unrelieved by Tylenol please call our clinic.  If you fail to improve or worsen please call our clinic and/or report directly to the emergency room.  I will call you tomorrow to see how you are feeling.   Acute Bronchitis, Adult Acute bronchitis is when air tubes (bronchi) in the lungs suddenly get swollen. The condition can make it hard to breathe. It can also cause these symptoms:  A cough.  Coughing up clear, yellow, or green mucus.  Wheezing.  Chest congestion.  Shortness of breath.  A fever.  Body aches.  Chills.  A sore throat. Follow these instructions at home:  Medicines  Take over-the-counter and prescription medicines only as told by your doctor.  If you were prescribed an antibiotic medicine, take it as told by your doctor. Do not stop taking the antibiotic even if you start to feel better. General instructions  Rest.  Drink enough fluids to keep your pee (urine) pale yellow.  Avoid smoking and secondhand smoke. If you smoke and you need help quitting, ask your doctor. Quitting will help your lungs heal faster.  Use an inhaler, cool mist vaporizer, or humidifier as told by your doctor.  Keep all follow-up visits as told by your doctor. This is important. How is this prevented? To lower your risk of getting this condition again:  Wash your hands often with soap and water. If you cannot use soap and water, use hand sanitizer.  Avoid contact with people who have cold symptoms.  Try not to touch your hands to your mouth, nose, or eyes.  Make  sure to get the flu shot every year. Contact a doctor if:  Your symptoms do not get better in 2 weeks. Get help right away if:  You cough up blood.  You have chest pain.  You have very bad shortness of breath.  You become dehydrated.  You faint (pass out) or keep feeling like you are going to pass out.  You keep throwing up (vomiting).  You have a very bad headache.  Your fever or chills gets worse. This information is not intended to replace advice given to you by your health care provider. Make sure you discuss any questions you have with your health care provider. Document Released: 04/23/2008 Document Revised: 06/19/2017 Document Reviewed: 04/25/2016 Elsevier Interactive Patient Education  2019 [employer]   Levofloxacin tablets What is this medicine? LEVOFLOXACIN (lee voe FLOX a sin) is a quinolone antibiotic. It is used to treat certain kinds of bacterial infections. It will not work for colds, flu, or other viral infections. This medicine may be used for other purposes; ask your health care provider or pharmacist if you have questions. COMMON BRAND NAME(S): Levaquin, Levaquin Leva-Pak What should I tell my health care provider before I take this medicine? They need to know if you have any of these conditions: -bone problems -diabetes -heart disease -high blood pressure -history of irregular heartbeat -history of low levels of potassium in the blood -joint problems -kidney disease -liver disease -mental  illness -myasthenia gravis -seizures -tendon problems -tingling of the fingers or toes, or other nerve disorder -an unusual or allergic reaction to levofloxacin, other quinolone antibiotics, foods, dyes, or preservatives -pregnant or trying to get pregnant -breast-feeding How should I use this medicine? Take this medicine by mouth with a full glass of water. Follow the directions on the prescription label. You can take it with or without food. If it upsets  your stomach, take it with food. Take your medicine at regular intervals. Do not take your medicine more often than directed. Take all of your medicine as directed even if you think you are better. Do not skip doses or stop your medicine early. Avoid antacids, calcium, iron, and zinc products for 2 hours before and 2 hours after taking a dose of this medicine. A special MedGuide will be given to you by the pharmacist with each prescription and refill. Be sure to read this information carefully each time. Talk to your pediatrician regarding the use of this medicine in children. While this drug may be prescribed for children as young as 6 months for selected conditions, precautions do apply. Overdosage: If you think you have taken too much of this medicine contact a poison control center or emergency room at once. NOTE: This medicine is only for you. Do not share this medicine with others. What if I miss a dose? If you miss a dose, take it as soon as you remember. If it is almost time for your next dose, take only that dose. Do not take double or extra doses. What may interact with this medicine? Do not take this medicine with any of the following medications: -cisapride -dofetilide -dronedarone -pimozide -thioridazine -ziprasidone This medicine may also interact with the following medications: -antacids -birth control pills -certain medicines for diabetes, like glipizide, glyburide, or insulin -certain medicines that treat or prevent blood clots like warfarin -didanosine buffered tablets or powder -multivitamins -NSAIDS, medicines for pain and inflammation, like ibuprofen or naproxen -other medicines that prolong the QT interval (cause an abnormal heart rhythm) -steroid medicines like prednisone or cortisone -sucralfate -theophylline This list may not describe all possible interactions. Give your health care provider a list of all the medicines, herbs, non-prescription drugs, or dietary  supplements you use. Also tell them if you smoke, drink alcohol, or use illegal drugs. Some items may interact with your medicine. What should I watch for while using this medicine? Tell your doctor or healthcare professional if your symptoms do not start to get better or if they get worse. Do not treat diarrhea with over the counter products. Contact your doctor if you have diarrhea that lasts more than 2 days or if it is severe and watery. Check with your doctor or health care professional if you get an attack of severe diarrhea, nausea and vomiting, or if you sweat a lot. The loss of too much body fluid can make it dangerous for you to take this medicine. This medicine may affect blood sugar levels. If you have diabetes, check with your doctor or health care professional before you change your diet or the dose of your diabetic medicine. You may get drowsy or dizzy. Do not drive, use machinery, or do anything that needs mental alertness until you know how this medicine affects you. Do not sit or stand up quickly, especially if you are an older patient. This reduces the risk of dizzy or fainting spells. This medicine can make you more sensitive to the sun. Keep out of the  sun. If you cannot avoid being in the sun, wear protective clothing and use a sunscreen. Do not use sun lamps or tanning beds/booths. What side effects may I notice from receiving this medicine? Side effects that you should report to your doctor or health care professional as soon as possible: -allergic reactions like skin rash or hives, swelling of the face, lips, or tongue -anxious -bloody or watery diarrhea -breathing problems -confusion -depressed mood -fast, irregular heartbeat -fever -hallucination, loss of contact with reality -joint, muscle, or tendon pain or swelling -loss of memory -muscle weakness -pain, tingling, numbness in the hands or feet -seizures -signs and symptoms of aortic dissection such as sudden  chest, stomach, or back pain -signs and symptoms of high blood sugar such as dizziness; dry mouth; dry skin; fruity breath; nausea; stomach pain; increased hunger or thirst; increased urination -signs and symptoms of liver injury like dark yellow or brown urine; general ill feeling or flu-like symptoms; light-colored stools; loss of appetite; nausea; right upper belly pain; unusually weak or tired; yellowing of the eyes or skin -signs and symptoms of low blood sugar such as feeling anxious; confusion; dizziness; increased hunger; unusually weak or tired; sweating; shakiness; cold; irritable; headache; blurred vision; fast heartbeat; loss of consciousness; pale skin -suicidal thoughts or other mood changes -sunburn -unusually weak or tired Side effects that usually do not require medical attention (report to your doctor or health care professional if they continue or are bothersome): -constipation -dry mouth -headache -nausea, vomiting -trouble sleeping This list may not describe all possible side effects. Call your doctor for medical advice about side effects. You may report side effects to FDA at 1-800-FDA-1088. Where should I keep my medicine? Keep out of the reach of children. Store at room temperature between 15 and 30 degrees C (59 and 86 degrees F). Keep in a tightly closed container. Throw away any unused medicine after the expiration date. NOTE: This sheet is a summary. It may not cover all possible information. If you have questions about this medicine, talk to your doctor, pharmacist, or health care provider.  2019 Elsevier/Gold Standard (2018-05-20 17:41:46)  Prednisone delayed-release tablets What is this medicine? PREDNISONE (PRED ni sone) is a corticosteroid. It is commonly used to treat inflammation of the skin, joints, lungs, and other organs. Common conditions treated include asthma, allergies, and arthritis. It is also used for other conditions, such as blood disorders and  diseases of the adrenal glands. This medicine may be used for other purposes; ask your health care provider or pharmacist if you have questions. COMMON BRAND NAME(S): RAYOS What should I tell my health care provider before I take this medicine? They need to know if you have any of these conditions: -Cushing's syndrome -diabetes -glaucoma -heart disease -high blood pressure -infection (especially a virus infection such as chickenpox, cold sores, or herpes) -kidney disease -liver disease -mental illness -myasthenia gravis -osteoporosis -seizures -stomach or intestine problems -thyroid disease -an unusual or allergic reaction to lactose, prednisone, other medicines, foods, dyes, or preservatives -pregnant or trying to get pregnant -breast-feeding How should I use this medicine? Take this medicine by mouth with a glass of water. Follow the directions on the prescription label. Take this medicine with food. Do not cut, crush or chew this medicine. Do not suddenly stop taking your medicine because you may develop a severe reaction. If your doctor wants you to stop the medicine, the dose may be slowly lowered over time to avoid any side effects. Talk to your  pediatrician regarding the use of this medicine in children. Special care may be needed. Overdosage: If you think you have taken too much of this medicine contact a poison control center or emergency room at once. NOTE: This medicine is only for you. Do not share this medicine with others. What if I miss a dose? If you miss a dose, take it as soon as you can. If it is almost time for your next dose, talk to your doctor or health care professional. You may need to miss a dose or take an extra dose. Do not take double or extra doses without advice. What may interact with this medicine? Do not take this medicine with any of the following medications: -metyrapone -mifepristone This medicine may also interact with the following  medications: -aminoglutethimide -amphotericin B -aspirin and aspirin-like medicines -barbiturates -certain medicines for diabetes, like glipizide or glyburide -cholestyramine -cholinesterase inhibitors -cyclosporine -digoxin -diuretics -ephedrine -male hormones, like estrogens and birth control pills -isoniazid -ketoconazole -NSAIDS, medicines for pain and inflammation, like ibuprofen or naproxen -phenytoin -rifampin -toxoids -vaccines -warfarin This list may not describe all possible interactions. Give your health care provider a list of all the medicines, herbs, non-prescription drugs, or dietary supplements you use. Also tell them if you smoke, drink alcohol, or use illegal drugs. Some items may interact with your medicine. What should I watch for while using this medicine? Visit your doctor or health care professional for regular checks on your progress. If you are taking this medicine over a prolonged period, carry an identification card with your name and address, the type and dose of your medicine, and your doctor's name and address. This medicine may increase your risk of getting an infection. Tell your doctor or health care professional if you are around anyone with measles or chickenpox, or if you develop sores or blisters that do not heal properly. If you are going to have surgery, tell your doctor or health care professional that you have taken this medicine within the last twelve months. Ask your doctor or health care professional about your diet. You may need to lower the amount of salt you eat. This medicine may increase blood sugar. Ask your healthcare provider if changes in diet or medicines are needed if you have diabetes. What side effects may I notice from receiving this medicine? Side effects that you should report to your doctor or health care professional as soon as possible: -allergic reactions like skin rash, itching or hives, swelling of the face, lips, or  tongue -changes in emotions or moods -changes in vision -depressed mood -eye pain -fever or chills, cough, sore throat, pain or difficulty passing urine -signs and symptoms of high blood sugar such as being more thirsty or hungry or having to urinate more than normal. You may also feel very tired or have blurry vision. -swelling of ankles, feet Side effects that usually do not require medical attention (report to your doctor or health care professional if they continue or are bothersome): -confusion, excitement, restlessness -headache -nausea, vomiting -skin problems, acne, thin and shiny skin -trouble sleeping -weight gain This list may not describe all possible side effects. Call your doctor for medical advice about side effects. You may report side effects to FDA at 1-800-FDA-1088. Where should I keep my medicine? Keep out of the reach of children. Store at room temperature between 15 and 30 degrees C (59 and 86 degrees F). Protect from light and moisture. Keep container tightly closed. Throw away any unused medicine after  the expiration date. NOTE: This sheet is a summary. It may not cover all possible information. If you have questions about this medicine, talk to your doctor, pharmacist, or health care provider.  2019 Elsevier/Gold Standard (2018-08-05 11:09:11) Brompheniramine; Dextromethorphan; Pseudoephedrine oral solution What is this medicine? BROMPHENIRAMINE; DEXTROMETHORPHAN; PSEUDOEPHEDRINE (brome fen IR a meen; dex troe meth OR fan; soo doe e FED rin) is a histamine blocker, cough suppressant, and a decongestant. It can help relieve cough, runny nose, stuffy nose, sneezing, and itchy or watery eyes. This medicine is used to treat allergy and cold symptoms. This medicine will not treat an infection. This medicine may be used for other purposes; ask your health care provider or pharmacist if you have questions. COMMON BRAND NAME(S): Anaplex, Andehist DM, Andehist DM NR,  Brom/PSE/DM Cough, Bromaline DM, Bromatane DX, Bromaxefed DM RF, Bromdex D, Brometane DX, Bromfed-DM, Bromhist DM, Bromhist PDX, Bromophed DX, Bromphenex DM, Bromplex DM, Brotapp-DM, BroveX PSB DM, Carbofed DM, Cardec DM, Dallergy DM, Decon DM, Dimetane DX, Dimetapp Children's DM Cold and Cough, Dynatuss, EndaCof-DM, LoHist PSB DM, Myphetane DX, Neo DM, PBM Allergy, PediaHist DM, Q-Tapp DM, Robitussin Cough and Allergy, Rondamine DM, Rondec DM, Sildec DM, Tuss Mine DM What should I tell my health care provider before I take this medicine? They need to know if you have any of these conditions: -asthma -blood vessel disease -diabetes -difficulty passing urine -glaucoma -high blood pressure -other chronic disease -stomach ulcer -taken an MAOI like Carbex, Eldepryl, Marplan, Nardil, or Parnate in last 14 days -thyroid disease -an unusual or allergic reaction to brompheniramine, dextromethorphan, pseudoephedrine, other medicines, foods, dyes, or preservatives -pregnant or trying to get pregnant -breast-feeding How should I use this medicine? Take this medicine by mouth with a full glass of water. Follow the directions on the prescription label. Use a specially marked spoon or container to measure your medicine. Household spoons are not accurate. Take this medicine with food or milk if it upsets your stomach. Take your doses at regular times. Do not take more medicine than directed. Talk to your pediatrician regarding the use of this medicine in children. While this drug may be prescribed for children as young as 12 years old for selected conditions, precautions do apply. Patients over 60 years old may have a stronger reaction to this medicine and need smaller doses. Overdosage: If you think you have taken too much of this medicine contact a poison control center or emergency room at once. NOTE: This medicine is only for you. Do not share this medicine with others. What if I miss a dose? If you miss  a dose, take it as soon as you can. If it is almost time for your next dose, take only that dose. Do not take double or extra doses. What may interact with this medicine? Do not take this medicine with any of the following medications: -MAOIs like Carbex, Eldepryl, Marplan, Nardil, and Parnate This medicine may also interact with the following medications: -any product that contains alcohol -any stimulant drug -barbiturates -mecamylamine -medicines for anxiety or sleep -medicines for chest pain, heart disease, blood pressure or heart rhythm problems -medicines for colds or allergies -medicines for depression, anxiety, or psychotic disturbances -reserpine -some herbal or nutritional supplements -some medicines for pain -some medicines for Parkinson's disease This list may not describe all possible interactions. Give your health care provider a list of all the medicines, herbs, non-prescription drugs, or dietary supplements you use. Also tell them if you smoke, drink alcohol, or use  illegal drugs. Some items may interact with your medicine. What should I watch for while using this medicine? Tell your doctor or health care professional if your symptoms do not improve or if they get worse. If you have trouble falling asleep at night, take the last dose of the day at least a few hours before bedtime. You may get drowsy or dizzy. Do not drive, use machinery, or do anything that needs mental alertness until you know how this medicine affects you. To reduce the risk of dizzy or fainting spells, do not stand or sit up quickly, especially if you are an older patient. Alcohol may increase dizziness and drowsiness. Avoid alcoholic drinks. Your mouth may get dry. Chewing sugarless gum or sucking hard candy, and drinking plenty of water may help. Contact your doctor if the problem does not go away or is severe. This medicine may cause dry eyes and blurred vision. If you wear contact lenses you may feel some  discomfort. Lubricating drops may help. See your eye doctor if the problem does not go away or is severe. What side effects may I notice from receiving this medicine? Side effects that you should report to your doctor or health care professional as soon as possible: -allergic reactions like skin rash, itching or hives, swelling of the face, lips, or tongue -breathing problems -changes in vision -fast or irregular heartbeat -feeling faint or lightheaded, falls -hallucinations -high blood pressure -seizure -trouble passing urine or change in the amount of urine -vomiting Side effects that usually do not require medical attention (report to your doctor or health care professional if they continue or are bothersome): -anxiety -diarrhea -headache -loss of appetite -nausea This list may not describe all possible side effects. Call your doctor for medical advice about side effects. You may report side effects to FDA at 1-800-FDA-1088. Where should I keep my medicine? Keep out of the reach of children. Store between 8 and 30 degrees C (46 and 86 degrees F). Protect from heat and light. Throw away any unused medicine after the expiration date. NOTE: This sheet is a summary. It may not cover all possible information. If you have questions about this medicine, talk to your doctor, pharmacist, or health care provider.  2019 Elsevier/Gold Standard (2008-02-05 13:58:07)

## 2019-01-18 DIAGNOSIS — C642 Malignant neoplasm of left kidney, except renal pelvis: Secondary | ICD-10-CM | POA: Diagnosis not present

## 2019-01-18 DIAGNOSIS — R062 Wheezing: Secondary | ICD-10-CM | POA: Diagnosis not present

## 2019-01-18 DIAGNOSIS — R05 Cough: Secondary | ICD-10-CM | POA: Diagnosis not present

## 2019-01-18 DIAGNOSIS — J449 Chronic obstructive pulmonary disease, unspecified: Secondary | ICD-10-CM | POA: Diagnosis not present

## 2019-01-19 ENCOUNTER — Encounter: Payer: Self-pay | Admitting: Radiation Oncology

## 2019-01-19 ENCOUNTER — Other Ambulatory Visit: Payer: Self-pay

## 2019-01-19 ENCOUNTER — Ambulatory Visit
Admission: RE | Admit: 2019-01-19 | Discharge: 2019-01-19 | Disposition: A | Discharge status: Home / Self Care | Payer: PPO | Source: Ambulatory Visit | Attending: Radiation Oncology | Admitting: Radiation Oncology

## 2019-01-19 VITALS — BP 142/84 | HR 81 | Temp 95.8°F | Resp 18 | Wt 182.0 lb

## 2019-01-19 DIAGNOSIS — Z923 Personal history of irradiation: Secondary | ICD-10-CM | POA: Insufficient documentation

## 2019-01-19 DIAGNOSIS — C7951 Secondary malignant neoplasm of bone: Secondary | ICD-10-CM

## 2019-01-19 DIAGNOSIS — C649 Malignant neoplasm of unspecified kidney, except renal pelvis: Secondary | ICD-10-CM | POA: Insufficient documentation

## 2019-01-19 NOTE — Progress Notes (Signed)
Radiation Oncology Follow up Note  Name: Glen Davis   Date:   01/19/2019 MRN:  [id number] DOB: 1941/04/09    This 78 y.o. 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  REFERRING PROVIDER: Dion Body, MD  HPI: Glen Davis is a 77 year old male with stage IV renal cell carcinoma he is now 1 month out of palliative ration therapy to his left lower extremity. He has had excellent response as far as pain is concerned he is complaining of again progressive pain in his left shoulder and an area previously irradiated. Previous films have shown lytic lesion in the scapula on the left side. He states she's been doing some housework and may have injured it. He is currently on.Xgevaas well as nivo.  COMPLICATIONS OF TREATMENT: none  FOLLOW UP COMPLIANCE: keeps appointments   PHYSICAL EXAM:  BP (!) 142/84 (BP Location: Left Arm, Glen Davis Position: Sitting)   Pulse 81   Temp (!) 95.8 F (35.4 C) (Tympanic)   Resp 18   Wt 181 lb 15.8 oz (82.6 kg)   BMI 24.01 kg/m  Range of motion is limited in left upper extremity second to pain.Well-developed well-nourished Glen Davis in NAD. HEENT reveals PERLA, EOMI, discs not visualized.  Oral cavity is clear. No oral mucosal lesions are identified. Neck is clear without evidence of cervical or supraclavicular adenopathy. Lungs are clear to A&P. Cardiac examination is essentially unremarkable with regular rate and rhythm without murmur rub or thrill. Abdomen is benign with no organomegaly or masses noted. Motor sensory and DTR levels are equal and symmetric in the upper and lower extremities. Cranial nerves II through XII are grossly intact. Proprioception is intact. No peripheral adenopathy or edema is identified. No motor or sensory levels are noted. Crude visual fields are within normal range.  RADIOLOGY RESULTS: previous PET/CT scans and CT scans  reviewed  PLAN: at this time I have reviewed his films and previous treatment areas the area of pain in his left shoulder was previously treated in 3000 cGy in 10 fractions. I like to wait a few more weeks and see if the pain subsided as it's relatively new in increased intensity. I've asked to see him back in 1 month for follow-up. Should the pain persist we'll get an MRI of a left shoulder prior to me seeing him. Glen Davis Has been rectal irrigations well.  I would like to take this opportunity to thank you for allowing me to participate in the care of your Glen Davis.Noreene Filbert, MD

## 2019-01-27 ENCOUNTER — Other Ambulatory Visit: Payer: Self-pay

## 2019-01-27 DIAGNOSIS — C44622 Squamous cell carcinoma of skin of right upper limb, including shoulder: Secondary | ICD-10-CM

## 2019-01-29 ENCOUNTER — Inpatient Hospital Stay: Payer: PPO | Attending: Internal Medicine

## 2019-01-29 ENCOUNTER — Encounter: Payer: Self-pay | Admitting: Internal Medicine

## 2019-01-29 ENCOUNTER — Inpatient Hospital Stay (HOSPITAL_BASED_OUTPATIENT_CLINIC_OR_DEPARTMENT_OTHER): Payer: PPO | Admitting: Internal Medicine

## 2019-01-29 ENCOUNTER — Inpatient Hospital Stay: Payer: PPO

## 2019-01-29 ENCOUNTER — Other Ambulatory Visit: Payer: Self-pay

## 2019-01-29 VITALS — BP 123/70 | HR 75 | Temp 97.6°F | Resp 20 | Ht 73.0 in | Wt 183.0 lb

## 2019-01-29 DIAGNOSIS — G893 Neoplasm related pain (acute) (chronic): Secondary | ICD-10-CM | POA: Diagnosis not present

## 2019-01-29 DIAGNOSIS — Z87442 Personal history of urinary calculi: Secondary | ICD-10-CM

## 2019-01-29 DIAGNOSIS — C7802 Secondary malignant neoplasm of left lung: Secondary | ICD-10-CM

## 2019-01-29 DIAGNOSIS — E041 Nontoxic single thyroid nodule: Secondary | ICD-10-CM | POA: Insufficient documentation

## 2019-01-29 DIAGNOSIS — Z87891 Personal history of nicotine dependence: Secondary | ICD-10-CM | POA: Diagnosis not present

## 2019-01-29 DIAGNOSIS — Z7984 Long term (current) use of oral hypoglycemic drugs: Secondary | ICD-10-CM | POA: Insufficient documentation

## 2019-01-29 DIAGNOSIS — Z923 Personal history of irradiation: Secondary | ICD-10-CM | POA: Insufficient documentation

## 2019-01-29 DIAGNOSIS — C7951 Secondary malignant neoplasm of bone: Secondary | ICD-10-CM

## 2019-01-29 DIAGNOSIS — Z5112 Encounter for antineoplastic immunotherapy: Secondary | ICD-10-CM | POA: Diagnosis not present

## 2019-01-29 DIAGNOSIS — I2510 Atherosclerotic heart disease of native coronary artery without angina pectoris: Secondary | ICD-10-CM

## 2019-01-29 DIAGNOSIS — Z7982 Long term (current) use of aspirin: Secondary | ICD-10-CM

## 2019-01-29 DIAGNOSIS — I10 Essential (primary) hypertension: Secondary | ICD-10-CM | POA: Diagnosis not present

## 2019-01-29 DIAGNOSIS — C7801 Secondary malignant neoplasm of right lung: Secondary | ICD-10-CM | POA: Diagnosis not present

## 2019-01-29 DIAGNOSIS — C642 Malignant neoplasm of left kidney, except renal pelvis: Secondary | ICD-10-CM | POA: Diagnosis not present

## 2019-01-29 DIAGNOSIS — Z85820 Personal history of malignant melanoma of skin: Secondary | ICD-10-CM

## 2019-01-29 DIAGNOSIS — Z9221 Personal history of antineoplastic chemotherapy: Secondary | ICD-10-CM

## 2019-01-29 DIAGNOSIS — J449 Chronic obstructive pulmonary disease, unspecified: Secondary | ICD-10-CM | POA: Insufficient documentation

## 2019-01-29 DIAGNOSIS — Z79899 Other long term (current) drug therapy: Secondary | ICD-10-CM | POA: Diagnosis not present

## 2019-01-29 DIAGNOSIS — C44622 Squamous cell carcinoma of skin of right upper limb, including shoulder: Secondary | ICD-10-CM

## 2019-01-29 LAB — COMPREHENSIVE METABOLIC PANEL
ALT: 13 U/L (ref 0–44)
AST: 17 U/L (ref 15–41)
Albumin: 3.9 g/dL (ref 3.5–5.0)
Alkaline Phosphatase: 65 U/L (ref 38–126)
Anion gap: 7 (ref 5–15)
BUN: 15 mg/dL (ref 8–23)
CO2: 26 mmol/L (ref 22–32)
Calcium: 9 mg/dL (ref 8.9–10.3)
Chloride: 105 mmol/L (ref 98–111)
Creatinine, Ser: 1.22 mg/dL (ref 0.61–1.24)
GFR calc Af Amer: >60 mL/min (ref >60)
GFR calc non Af Amer: 57 mL/min — ABNORMAL LOW (ref >60)
Glucose, Bld: 125 mg/dL — ABNORMAL HIGH (ref 70–99)
Potassium: 4.1 mmol/L (ref 3.5–5.1)
Sodium: 138 mmol/L (ref 135–145)
Total Bilirubin: 0.6 mg/dL (ref 0.3–1.2)
Total Protein: 7 g/dL (ref 6.5–8.1)

## 2019-01-29 LAB — CBC WITH DIFFERENTIAL/PLATELET
Abs Immature Granulocytes: 0.02 10*3/uL (ref 0.00–0.07)
Basophils Absolute: 0 10*3/uL (ref 0.0–0.1)
Basophils Relative: 1 %
Eosinophils Absolute: 0.1 10*3/uL (ref 0.0–0.5)
Eosinophils Relative: 2 %
HCT: 38 % — ABNORMAL LOW (ref 39.0–52.0)
Hemoglobin: 12.7 g/dL — ABNORMAL LOW (ref 13.0–17.0)
Immature Granulocytes: 0 %
LYMPHS ABS: 1.1 10*3/uL (ref 0.7–4.0)
Lymphocytes Relative: 16 %
MCH: 30.2 pg (ref 26.0–34.0)
MCHC: 33.4 g/dL (ref 30.0–36.0)
MCV: 90.3 fL (ref 80.0–100.0)
Monocytes Absolute: 0.7 10*3/uL (ref 0.1–1.0)
Monocytes Relative: 10 %
Neutro Abs: 4.7 10*3/uL (ref 1.7–7.7)
Neutrophils Relative %: 71 %
Platelets: 143 10*3/uL — ABNORMAL LOW (ref 150–400)
RBC: 4.21 MIL/uL — ABNORMAL LOW (ref 4.22–5.81)
RDW: 16.6 % — ABNORMAL HIGH (ref 11.5–15.5)
WBC: 6.6 10*3/uL (ref 4.0–10.5)
nRBC: 0 % (ref 0.0–0.2)

## 2019-01-29 NOTE — Progress Notes (Signed)
[facility] OFFICE PROGRESS NOTE  Patient Care Team: Dion Body, MD as PCP - General (Family Medicine) Jannet Mantis, MD (Dermatology) Bary Castilla, Forest Gleason, MD (General Surgery) Hollice Espy, MD as Consulting Physician (Urology) Cammie Sickle, MD as Medical Oncologist (Medical Oncology) Isaias Cowman, MD as Consulting Physician (Cardiology) Harriette Bouillon, MD as Referring Physician (Cardiology)  Cancer Staging No matching staging information was found for the patient.   Oncology History   # MAY- June 2017- METASTATIC CLEAR CELL; LEFT RENAL CA/STAGE IV; [place]- G-3; INTERMEDIATE RISK;  [bil Pul lung nodules;incidental s/p Bx Dr.Byrnett/Dr.Oaks] s/p Cytoreductive nephrectomy; Dr.Brandon- [id number]; July 11th 2017 CT- Enlarging Lung nodules  # Aug 7th 2017- PAZOPANIB; STOP SEP 2017 [pancreatitis/poor tol]  # OCT 1 week- START NIVO q 2W x4; DEC 8th CT- "Progression"; Continue Opdivo;APRIL-MAY 2018- STABLE LUNG NODULES [stopped sec to severe cough; NO Pneumonitis]  # May 31st 2018- Cabo 61m/day; July 31st CT lung- improved lung nodules; Sep 1st week- cabo- 431mday [dose reduced sec to mult intol]; Oct 1st 2week-Cabo 2080may;   # NOV 15th 2018- CT chest- slight progression of lung nodules [poor tol to higer doses];   # Nov 28th 2018- SUTENT 50 mg 2w-on  &1 w-OFF;  # MARCH 2019- Progression; start Axitinib 5 mg BID; July 2019- MIXED response; worsening/new skeletal lesions/stable to improved lung lesions; STOP Axitinib;   # Left hip s/p RT [mid-AUG 2019]  # AUG 12th- RE-START CABO 40 mg/day; STOP SEP 2019- sec to intol [abdominal pain even on 64m33my]; [facility] q W x 12 treatments; May 28, 2019-progression of the lung nodules bone lesions.  # Jan 2020- 13th-Lenvima 12 mg; disocn F3b 2020; clinical progression  # Feb 20th, 2020- IPI+NIVO  # March 2017-  Malignant melanoma of the  intrascapular area on the back ]right side];STAGE I  0.42 millimeter depth; s/p WLE [Dr.Byrnett]  ---------------------------------------------------------    # March 2019- Molecular testing- PDL-1 NEG; TMB-Low; MSI-STABLE; No targets; VHL mutation of  unknown significance  -----------------------------------------------------   # Dx; Kidney cancer/ clear cell STAGE IV  Current treatment: IPI+NIVO Goal: Palliative     [facility] renal pelvis [facility]   INTERVAL HISTORY:  [id number].  male pleasant patient above history of metastatic renal cell cancer currently on ipi+ nivo status post cycle 1 is here for follow-up.  Patient denies any new shortness of breath or cough or diarrhea or rash.  Continues to complain of mild to moderate fatigue.  Not any worse.  Continues to have lump on the left lower extremity/shin.  This is neither getting better or worse.  He continues to benefit [place] taking breakthrough pain medication as needed.  Review of Systems  Constitutional: Positive for malaise/fatigue. Negative for chills, diaphoresis and fever.  HENT: Negative for nosebleeds and sore throat.   Eyes: Negative for double vision.  Respiratory: Negative for cough, hemoptysis, sputum production, shortness of breath and wheezing.   Cardiovascular: Negative for chest pain, palpitations, orthopnea and leg swelling.  Gastrointestinal: Negative for blood in stool, constipation, heartburn, melena and vomiting.  Genitourinary: Negative for dysuria, frequency and urgency.  Musculoskeletal: Positive for back pain and joint pain (left shoulder pain).       Approximate 2 cm nodule noted on the left mid tibia.  Skin: Negative.  Negative for itching and rash.  Neurological: Negative for tingling, focal weakness and weakness.  Endo/Heme/Allergies:  Does not bruise/bleed easily.  Psychiatric/Behavioral: Negative for depression. The patient is not nervous/anxious.        PAST MEDICAL HISTORY :  Past Medical History:  Diagnosis Date  . CAD (coronary artery disease)   . Cancer ([place])    left arm  . COPD (chronic obstructive pulmonary disease) ([place])   . Hypertension   . Kidney stone   . Lung cancer ([name])   . Melanoma ([place]) 01/24/2016   right shoulder  . Thyroid nodule 02/02/2016   left BENIGN THYROID NODULE by FNA    PAST SURGICAL HISTORY :   Past Surgical History:  Procedure Laterality Date  . arm surgery Left    arm  . cardiac stents  2011   Angioplasty / Stenting Femoral-X2  . COLONOSCOPY  04/01/12  . CORONARY ANGIOPLASTY    . EXCISION MELANOMA WITH SENTINEL LYMPH NODE BIOPSY Right 01/24/2016   Procedure: EXCISION MELANOMA Right Shoulder;  Surgeon: Robert Bellow, MD;  Location: [facility] ORS;  Service: General;  Laterality: Right;  . LAPAROSCOPIC NEPHRECTOMY, HAND ASSISTED Left 04/24/2016   Procedure: HAND ASSISTED LAPAROSCOPIC NEPHRECTOMY;  Surgeon: Hollice Espy, MD;  Location: [facility] ORS;  Service: Urology;  Laterality: Left;  Marland Kitchen VIDEO ASSISTED THORACOSCOPY (VATS)/THOROCOTOMY Left 03/08/2016   Procedure: VIDEO ASSISTED THORACOSCOPY (VATS) with lung biopsy - Left ;  Surgeon: Robert Bellow, MD;  Location: [facility] ORS;  Service: General;  Laterality: Left;    FAMILY HISTORY :   Family History  Problem Relation Age of Onset  . Hodgkin's lymphoma Daughter 5  . Heart attack Father   . Kidney cancer Neg Hx   . Kidney disease Neg Hx   . Prostate cancer Neg Hx     SOCIAL HISTORY:   Social History   Tobacco Use  . Smoking status: Former Smoker    Packs/day: 2.00    Years: 30.00    Pack years: 60.00    Types: Cigarettes    Start date: 01/18/1956    Last attempt to quit: 01/17/1969    Years since quitting: 50.0  . Smokeless tobacco: Never Used  Substance Use Topics  . Alcohol use: No    Alcohol/week: 0.0 standard drinks    Comment: BEER OCC  . Drug use: No    ALLERGIES:  is allergic to lisinopril and other.  MEDICATIONS:  Current  Outpatient Medications  Medication Sig Dispense Refill  . albuterol (PROVENTIL HFA;VENTOLIN HFA) 108 (90 Base) MCG/ACT inhaler Inhale 2 puffs into the lungs every 6 (six) hours as needed for wheezing or shortness of breath. 1 Inhaler 2  . aspirin EC 81 MG tablet Take 81 mg by mouth daily.    . Aspirin-Salicylamide-Caffeine (BC HEADACHE POWDER PO) Take 1 packet by mouth daily.    Marland Kitchen atorvastatin (LIPITOR) 40 MG tablet Take 40 mg by mouth daily.    . busPIRone (BUSPAR) 5 MG tablet Take 5 mg by mouth 2 (two) times daily.    . chlorpheniramine-HYDROcodone (TUSSIONEX) 10-8 MG/5ML SUER Take 5 mLs by mouth every 12 (twelve) hours as needed for cough. 140 mL 0  . cholecalciferol (VITAMIN D) 400 units TABS tablet Take 400 Units by mouth daily.    . clopidogrel (PLAVIX) 75 MG tablet Take 75 mg by mouth daily.    . Coenzyme Q10 (COQ10) 100 MG CAPS Take 1 capsule by mouth daily.    . DULoxetine (CYMBALTA) 30 MG capsule Take 2 capsules by mouth daily.    . fentaNYL (DURAGESIC) 50 MCG/HR Place 1 patch onto  the skin every 3 (three) days. 10 patch 0  . fluticasone (FLONASE) 50 MCG/ACT nasal spray Place 2 sprays into both nostrils daily. 16 g 2  . Fluticasone-Salmeterol (ADVAIR DISKUS) 500-50 MCG/DOSE AEPB Inhale 1 puff into the lungs 2 (two) times daily. 60 each 11  . HYDROmorphone (DILAUDID) 2 MG tablet Take 1 tablet (2 mg total) by mouth every 6 (six) hours as needed for moderate pain or severe pain. 60 tablet 0  . ipratropium-albuterol (DUONEB) 0.5-2.5 (3) MG/3ML SOLN Take 3 mLs by nebulization every 4 (four) hours as needed. 360 mL 3  . loperamide (IMODIUM) 1 MG/5ML solution Take 4 mg by mouth as needed for diarrhea or loose stools.    Marland Kitchen loratadine (CLARITIN) 10 MG tablet Take 10 mg by mouth daily.    Marland Kitchen MELATONIN PO Take 10 mg by mouth at bedtime.     . metFORMIN (GLUCOPHAGE) 500 MG tablet Take 1 tablet (500 mg total) by mouth 2 (two) times daily with a meal. 60 tablet 3  . Misc Natural Products (GLUCOSAMINE  CHOND COMPLEX/MSM) TABS Take 2 tablets by mouth daily.    . montelukast (SINGULAIR) 10 MG tablet TAKE 1 TABLET BY MOUTH EVERYDAY AT BEDTIME 90 tablet 1  . nitroGLYCERIN (NITROSTAT) 0.4 MG SL tablet Place 0.4 mg under the tongue every 5 (five) minutes as needed.     . Omega-3 Fatty Acids (FISH OIL) 1000 MG CPDR Take 1 capsule by mouth daily.    . ondansetron (ZOFRAN) 8 MG tablet Take 1 tablet (8 mg total) by mouth every 8 (eight) hours as needed for nausea or vomiting. 60 tablet 0  . OXYGEN Inhale 2 L into the lungs at bedtime.    . predniSONE (STERAPRED UNI-PAK 21 TAB) 10 MG (21) TBPK tablet Take as directed. 21 tablet 0  . tamsulosin (FLOMAX) 0.4 MG CAPS capsule TAKE 1 CAPSULE (0.4 MG TOTAL) BY MOUTH EVERY MORNING. 30 capsule 3  . temazepam (RESTORIL) 7.5 MG capsule Take 1 capsule (7.5 mg total) by mouth at bedtime as needed for sleep. 30 capsule 0  . Tiotropium Bromide Monohydrate (SPIRIVA RESPIMAT) 1.25 MCG/ACT AERS Inhale 1.25 Act into the lungs 2 (two) times daily. 1 Inhaler 11  . tiZANidine (ZANAFLEX) 4 MG tablet Take 4 mg by mouth daily.     . Turmeric 500 MG TABS Take 1 tablet by mouth daily.     No current facility-administered medications for this visit.    Facility-Administered Medications Ordered in Other Visits  Medication Dose Route Frequency Provider Last Rate Last Dose  . 0.9 %  sodium chloride infusion   Intravenous Continuous Cammie Sickle, MD 10 mL/hr at 10/27/18 1054      PHYSICAL EXAMINATION: ECOG PERFORMANCE STATUS: 1 - Symptomatic but completely ambulatory  BP 123/70 (BP Location: Left Arm, Patient Position: Sitting)   Pulse 75   Temp 97.6 F (36.4 C) (Tympanic)   Resp 20   Ht _0  (1.854 m)   Wt 183 lb (83 kg)   BMI 24.14 kg/m   Filed Weights   01/29/19 0907  Weight: 183 lb (83 kg)    Physical Exam  Constitutional: He is oriented to person, place, and time and well-developed, well-nourished, and in no distress.  Accompanied by his sister.  He  is walking by himself.  HENT:  Head: Normocephalic and atraumatic.  Mouth/Throat: Oropharynx is clear and moist. No oropharyngeal exudate.  Eyes: Pupils are equal, round, and reactive to light.  Neck: Normal range of motion.  Neck supple.  Cardiovascular: Normal rate and regular rhythm.  Pulmonary/Chest: No respiratory distress. He has no wheezes.  Abdominal: Soft. Bowel sounds are normal. He exhibits no distension and no mass. There is no guarding.  Musculoskeletal:        General: No tenderness or edema.     Comments: Left leg shin nodule swelling-getting radiation.  Neurological: He is alert and oriented to person, place, and time.  Skin: Skin is warm.  Psychiatric: Affect normal.       LABORATORY DATA:  I have reviewed the data as listed    Component Value Date/Time   NA 138 01/29/2019 0800   NA 146 (H) 05/25/2016 1514   K 4.1 01/29/2019 0800   CL 105 01/29/2019 0800   CO2 26 01/29/2019 0800   GLUCOSE 125 (H) 01/29/2019 0800   BUN 15 01/29/2019 0800   BUN 19 05/25/2016 1514   CREATININE 1.22 01/29/2019 0800   CALCIUM 9.0 01/29/2019 0800   PROT 7.0 01/29/2019 0800   ALBUMIN 3.9 01/29/2019 0800   AST 17 01/29/2019 0800   ALT 13 01/29/2019 0800   ALKPHOS 65 01/29/2019 0800   BILITOT 0.6 01/29/2019 0800   GFRNONAA 57 (L) 01/29/2019 0800   GFRAA >60 01/29/2019 0800    No results found for: SPEP, UPEP  Lab Results  Component Value Date   WBC 6.6 01/29/2019   NEUTROABS 4.7 01/29/2019   HGB 12.7 (L) 01/29/2019   HCT 38.0 (L) 01/29/2019   MCV 90.3 01/29/2019   PLT 143 (L) 01/29/2019      Chemistry      Component Value Date/Time   NA 138 01/29/2019 0800   NA 146 (H) 05/25/2016 1514   K 4.1 01/29/2019 0800   CL 105 01/29/2019 0800   CO2 26 01/29/2019 0800   BUN 15 01/29/2019 0800   BUN 19 05/25/2016 1514   CREATININE 1.22 01/29/2019 0800      Component Value Date/Time   CALCIUM 9.0 01/29/2019 0800   ALKPHOS 65 01/29/2019 0800   AST 17 01/29/2019 0800    ALT 13 01/29/2019 0800   BILITOT 0.6 01/29/2019 0800       RADIOGRAPHIC STUDIES: I have personally reviewed the radiological images as listed and agreed with the findings in the report. No results found.   ASSESSMENT & PLAN:  Cancer of left kidney excluding renal pelvis [facility])  # Left kidney cancer metastatic to the lung-  Jan 9th CT- progression of lung/ bone lesions; currently on Ipi [1]+ nivo [3] q 3 W; clinically stable.  #  proceed with cycle #1 today. Labs today reviewed;  acceptable for treatment; however plan treatment because of computer shutdown.  Patient agreement..  # Left shoulder pain/ left leg pain- fenatnyl patch every 3 days; Dilaudid as needed.   Continue Xgeva.  # DISPOSITION: # treatment next week/EPIC shutdown.  # follow up in 3 weeks- x-MD/labs;cbc/cmp;IPI+NIVO-- Dr.B   Orders Placed This Encounter  Procedures  . CBC with Differential    Standing Status:   Standing    Number of Occurrences:   20    Standing Expiration Date:   01/29/2020  . Comprehensive metabolic panel    Standing Status:   Standing    Number of Occurrences:   20    Standing Expiration Date:   01/29/2020   All questions were answered. The patient knows to call the clinic with any problems, questions or concerns.      Cammie Sickle, MD 02/01/2019 9:31 AM

## 2019-01-29 NOTE — Assessment & Plan Note (Addendum)
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 Labs today reviewed;  acceptable for treatment; however plan treatment next week because of computer shutdown.  Patient agreement..  # Left shoulder pain/ left leg pain- fenatnyl patch every 3 days; Dilaudid as needed.   Continue Xgeva.  # DISPOSITION: # treatment next week/EPIC shutdown.  # follow up in 3 weeks- x-MD/labs;cbc/cmp;IPI+NIVO-- Dr.B

## 2019-02-02 ENCOUNTER — Inpatient Hospital Stay: Payer: PPO

## 2019-02-02 ENCOUNTER — Other Ambulatory Visit: Payer: Self-pay

## 2019-02-02 DIAGNOSIS — C7951 Secondary malignant neoplasm of bone: Secondary | ICD-10-CM

## 2019-02-02 DIAGNOSIS — C642 Malignant neoplasm of left kidney, except renal pelvis: Secondary | ICD-10-CM

## 2019-02-02 DIAGNOSIS — Z5112 Encounter for antineoplastic immunotherapy: Secondary | ICD-10-CM | POA: Diagnosis not present

## 2019-02-02 DIAGNOSIS — Z7189 Other specified counseling: Secondary | ICD-10-CM

## 2019-02-02 MED ORDER — DENOSUMAB 120 MG/1.7ML ~~LOC~~ SOLN
120.0000 mg | Freq: Once | SUBCUTANEOUS | Status: AC
  Administered 2019-02-02: 120 mg via SUBCUTANEOUS
  Filled 2019-02-02: qty 1.7

## 2019-02-02 MED ORDER — SODIUM CHLORIDE 0.9 % IV SOLN
Freq: Once | INTRAVENOUS | Status: AC
  Administered 2019-02-02: 12:00:00 via INTRAVENOUS
  Filled 2019-02-02: qty 250

## 2019-02-02 MED ORDER — FAMOTIDINE IN NACL 20-0.9 MG/50ML-% IV SOLN
20.0000 mg | Freq: Once | INTRAVENOUS | Status: AC
  Administered 2019-02-02: 20 mg via INTRAVENOUS
  Filled 2019-02-02: qty 50

## 2019-02-02 MED ORDER — SODIUM CHLORIDE 0.9 % IV SOLN
1.0000 mg/kg | Freq: Once | INTRAVENOUS | Status: AC
  Administered 2019-02-02: 85 mg via INTRAVENOUS
  Filled 2019-02-02: qty 17

## 2019-02-02 MED ORDER — DIPHENHYDRAMINE HCL 50 MG/ML IJ SOLN
25.0000 mg | Freq: Once | INTRAMUSCULAR | Status: AC
  Administered 2019-02-02: 25 mg via INTRAVENOUS
  Filled 2019-02-02: qty 1

## 2019-02-02 MED ORDER — SODIUM CHLORIDE 0.9 % IV SOLN
240.0000 mg | Freq: Once | INTRAVENOUS | Status: AC
  Administered 2019-02-02: 240 mg via INTRAVENOUS
  Filled 2019-02-02: qty 24

## 2019-02-10 ENCOUNTER — Inpatient Hospital Stay
Admission: EM | Admit: 2019-02-10 | Discharge: 2019-02-12 | DRG: 493 | Disposition: A | Discharge status: Home Health | Payer: PPO | Attending: Specialist | Admitting: Specialist

## 2019-02-10 ENCOUNTER — Emergency Department: Payer: PPO

## 2019-02-10 ENCOUNTER — Other Ambulatory Visit: Payer: Self-pay

## 2019-02-10 ENCOUNTER — Encounter: Payer: Self-pay | Admitting: Emergency Medicine

## 2019-02-10 DIAGNOSIS — J9611 Chronic respiratory failure with hypoxia: Secondary | ICD-10-CM | POA: Diagnosis not present

## 2019-02-10 DIAGNOSIS — W010XXA Fall on same level from slipping, tripping and stumbling without subsequent striking against object, initial encounter: Secondary | ICD-10-CM | POA: Diagnosis not present

## 2019-02-10 DIAGNOSIS — I10 Essential (primary) hypertension: Secondary | ICD-10-CM | POA: Diagnosis not present

## 2019-02-10 DIAGNOSIS — Z888 Allergy status to other drugs, medicaments and biological substances status: Secondary | ICD-10-CM

## 2019-02-10 DIAGNOSIS — S82402A Unspecified fracture of shaft of left fibula, initial encounter for closed fracture: Secondary | ICD-10-CM | POA: Diagnosis not present

## 2019-02-10 DIAGNOSIS — Z8249 Family history of ischemic heart disease and other diseases of the circulatory system: Secondary | ICD-10-CM

## 2019-02-10 DIAGNOSIS — E119 Type 2 diabetes mellitus without complications: Secondary | ICD-10-CM

## 2019-02-10 DIAGNOSIS — Z85820 Personal history of malignant melanoma of skin: Secondary | ICD-10-CM

## 2019-02-10 DIAGNOSIS — R509 Fever, unspecified: Secondary | ICD-10-CM | POA: Diagnosis not present

## 2019-02-10 DIAGNOSIS — Z87891 Personal history of nicotine dependence: Secondary | ICD-10-CM

## 2019-02-10 DIAGNOSIS — Z79891 Long term (current) use of opiate analgesic: Secondary | ICD-10-CM

## 2019-02-10 DIAGNOSIS — I2510 Atherosclerotic heart disease of native coronary artery without angina pectoris: Secondary | ICD-10-CM | POA: Diagnosis present

## 2019-02-10 DIAGNOSIS — Z9981 Dependence on supplemental oxygen: Secondary | ICD-10-CM

## 2019-02-10 DIAGNOSIS — C7951 Secondary malignant neoplasm of bone: Secondary | ICD-10-CM | POA: Diagnosis not present

## 2019-02-10 DIAGNOSIS — Z905 Acquired absence of kidney: Secondary | ICD-10-CM | POA: Diagnosis not present

## 2019-02-10 DIAGNOSIS — C642 Malignant neoplasm of left kidney, except renal pelvis: Secondary | ICD-10-CM | POA: Diagnosis not present

## 2019-02-10 DIAGNOSIS — S82102A Unspecified fracture of upper end of left tibia, initial encounter for closed fracture: Principal | ICD-10-CM | POA: Diagnosis present

## 2019-02-10 DIAGNOSIS — Z79899 Other long term (current) drug therapy: Secondary | ICD-10-CM

## 2019-02-10 DIAGNOSIS — Z7952 Long term (current) use of systemic steroids: Secondary | ICD-10-CM

## 2019-02-10 DIAGNOSIS — S82832A Other fracture of upper and lower end of left fibula, initial encounter for closed fracture: Secondary | ICD-10-CM | POA: Diagnosis not present

## 2019-02-10 DIAGNOSIS — Z87442 Personal history of urinary calculi: Secondary | ICD-10-CM | POA: Diagnosis not present

## 2019-02-10 DIAGNOSIS — Z955 Presence of coronary angioplasty implant and graft: Secondary | ICD-10-CM

## 2019-02-10 DIAGNOSIS — Z7951 Long term (current) use of inhaled steroids: Secondary | ICD-10-CM | POA: Diagnosis not present

## 2019-02-10 DIAGNOSIS — C7800 Secondary malignant neoplasm of unspecified lung: Secondary | ICD-10-CM | POA: Diagnosis not present

## 2019-02-10 DIAGNOSIS — Z807 Family history of other malignant neoplasms of lymphoid, hematopoietic and related tissues: Secondary | ICD-10-CM

## 2019-02-10 DIAGNOSIS — N400 Enlarged prostate without lower urinary tract symptoms: Secondary | ICD-10-CM | POA: Diagnosis present

## 2019-02-10 DIAGNOSIS — Z7984 Long term (current) use of oral hypoglycemic drugs: Secondary | ICD-10-CM | POA: Diagnosis not present

## 2019-02-10 DIAGNOSIS — W1830XA Fall on same level, unspecified, initial encounter: Secondary | ICD-10-CM | POA: Diagnosis present

## 2019-02-10 DIAGNOSIS — Z7982 Long term (current) use of aspirin: Secondary | ICD-10-CM | POA: Diagnosis not present

## 2019-02-10 DIAGNOSIS — S82202A Unspecified fracture of shaft of left tibia, initial encounter for closed fracture: Secondary | ICD-10-CM

## 2019-02-10 DIAGNOSIS — F329 Major depressive disorder, single episode, unspecified: Secondary | ICD-10-CM | POA: Diagnosis present

## 2019-02-10 DIAGNOSIS — S82252A Displaced comminuted fracture of shaft of left tibia, initial encounter for closed fracture: Secondary | ICD-10-CM | POA: Diagnosis not present

## 2019-02-10 DIAGNOSIS — F419 Anxiety disorder, unspecified: Secondary | ICD-10-CM | POA: Diagnosis present

## 2019-02-10 DIAGNOSIS — E785 Hyperlipidemia, unspecified: Secondary | ICD-10-CM | POA: Diagnosis present

## 2019-02-10 DIAGNOSIS — Z419 Encounter for procedure for purposes other than remedying health state, unspecified: Secondary | ICD-10-CM

## 2019-02-10 DIAGNOSIS — G893 Neoplasm related pain (acute) (chronic): Secondary | ICD-10-CM | POA: Diagnosis present

## 2019-02-10 DIAGNOSIS — S82109A Unspecified fracture of upper end of unspecified tibia, initial encounter for closed fracture: Secondary | ICD-10-CM | POA: Diagnosis present

## 2019-02-10 DIAGNOSIS — Y93K1 Activity, walking an animal: Secondary | ICD-10-CM

## 2019-02-10 DIAGNOSIS — J449 Chronic obstructive pulmonary disease, unspecified: Secondary | ICD-10-CM | POA: Diagnosis present

## 2019-02-10 DIAGNOSIS — Z79890 Hormone replacement therapy (postmenopausal): Secondary | ICD-10-CM

## 2019-02-10 LAB — CBC WITH DIFFERENTIAL/PLATELET
Abs Immature Granulocytes: 0.01 10*3/uL (ref 0.00–0.07)
Basophils Absolute: 0 10*3/uL (ref 0.0–0.1)
Basophils Relative: 0 %
Eosinophils Absolute: 0.3 10*3/uL (ref 0.0–0.5)
Eosinophils Relative: 5 %
HCT: 39.2 % (ref 39.0–52.0)
Hemoglobin: 12.9 g/dL — ABNORMAL LOW (ref 13.0–17.0)
Immature Granulocytes: 0 %
Lymphocytes Relative: 12 %
Lymphs Abs: 0.6 10*3/uL — ABNORMAL LOW (ref 0.7–4.0)
MCH: 30.3 pg (ref 26.0–34.0)
MCHC: 32.9 g/dL (ref 30.0–36.0)
MCV: 92 fL (ref 80.0–100.0)
MONO ABS: 0.6 10*3/uL (ref 0.1–1.0)
Monocytes Relative: 12 %
Neutro Abs: 3.8 10*3/uL (ref 1.7–7.7)
Neutrophils Relative %: 71 %
Platelets: 143 10*3/uL — ABNORMAL LOW (ref 150–400)
RBC: 4.26 MIL/uL (ref 4.22–5.81)
RDW: 16.3 % — AB (ref 11.5–15.5)
WBC: 5.3 10*3/uL (ref 4.0–10.5)
nRBC: 0 % (ref 0.0–0.2)

## 2019-02-10 LAB — COMPREHENSIVE METABOLIC PANEL
ALT: 16 U/L (ref 0–44)
AST: 19 U/L (ref 15–41)
Albumin: 3.9 g/dL (ref 3.5–5.0)
Alkaline Phosphatase: 71 U/L (ref 38–126)
Anion gap: 7 (ref 5–15)
BILIRUBIN TOTAL: 0.5 mg/dL (ref 0.3–1.2)
BUN: 12 mg/dL (ref 8–23)
CO2: 27 mmol/L (ref 22–32)
Calcium: 9.2 mg/dL (ref 8.9–10.3)
Chloride: 105 mmol/L (ref 98–111)
Creatinine, Ser: 1.02 mg/dL (ref 0.61–1.24)
GFR calc non Af Amer: >60 mL/min (ref >60)
Glucose, Bld: 153 mg/dL — ABNORMAL HIGH (ref 70–99)
Potassium: 3.8 mmol/L (ref 3.5–5.1)
Sodium: 139 mmol/L (ref 135–145)
TOTAL PROTEIN: 6.8 g/dL (ref 6.5–8.1)

## 2019-02-10 LAB — PROTIME-INR
INR: 1 (ref 0.8–1.2)
Prothrombin Time: 12.8 seconds (ref 11.4–15.2)

## 2019-02-10 MED ORDER — FLEET ENEMA 7-19 GM/118ML RE ENEM: 1.0000 | ENEMA | Freq: Once | RECTAL | Status: DC | PRN

## 2019-02-10 MED ORDER — PROMETHAZINE HCL 25 MG PO TABS
12.5000 mg | ORAL_TABLET | Freq: Four times a day (QID) | ORAL | Status: DC | PRN
  Filled 2019-02-10: qty 1

## 2019-02-10 MED ORDER — MORPHINE SULFATE (PF) 4 MG/ML IV SOLN
4.0000 mg | Freq: Once | INTRAVENOUS | Status: AC
  Administered 2019-02-10: 4 mg via INTRAVENOUS
  Filled 2019-02-10: qty 1

## 2019-02-10 MED ORDER — MELATONIN 5 MG PO TABS
10.0000 mg | ORAL_TABLET | Freq: Every day | ORAL | Status: DC
  Administered 2019-02-10 – 2019-02-11 (×2): 10 mg via ORAL
  Filled 2019-02-10 (×3): qty 2

## 2019-02-10 MED ORDER — MOMETASONE FURO-FORMOTEROL FUM 200-5 MCG/ACT IN AERO: 2.0000 | INHALATION_SPRAY | Freq: Two times a day (BID) | RESPIRATORY_TRACT | Status: DC

## 2019-02-10 MED ORDER — OMEGA-3-ACID ETHYL ESTERS 1 G PO CAPS
2.0000 g | ORAL_CAPSULE | Freq: Every day | ORAL | Status: DC
  Administered 2019-02-12: 2 g via ORAL
  Filled 2019-02-10: qty 2

## 2019-02-10 MED ORDER — FENTANYL 50 MCG/HR TD PT72
1.0000 | MEDICATED_PATCH | TRANSDERMAL | Status: DC
  Administered 2019-02-10: 1 via TRANSDERMAL
  Filled 2019-02-10: qty 1

## 2019-02-10 MED ORDER — HYDROMORPHONE HCL 1 MG/ML IJ SOLN
2.0000 mg | INTRAMUSCULAR | Status: DC | PRN
  Administered 2019-02-10 – 2019-02-12 (×5): 2 mg via INTRAVENOUS
  Filled 2019-02-10 (×5): qty 2

## 2019-02-10 MED ORDER — DULOXETINE HCL 60 MG PO CPEP
60.0000 mg | ORAL_CAPSULE | Freq: Every day | ORAL | Status: DC
  Administered 2019-02-12: 60 mg via ORAL
  Filled 2019-02-10 (×2): qty 1

## 2019-02-10 MED ORDER — ATORVASTATIN CALCIUM 20 MG PO TABS
40.0000 mg | ORAL_TABLET | Freq: Every day | ORAL | Status: DC
  Administered 2019-02-10 – 2019-02-11 (×2): 40 mg via ORAL
  Filled 2019-02-10 (×2): qty 2

## 2019-02-10 MED ORDER — ONDANSETRON HCL 4 MG/2ML IJ SOLN
4.0000 mg | Freq: Once | INTRAMUSCULAR | Status: AC
  Administered 2019-02-10: 4 mg via INTRAVENOUS
  Filled 2019-02-10: qty 2

## 2019-02-10 MED ORDER — SODIUM CHLORIDE 0.9 % IV SOLN: 250.0000 mL | INTRAVENOUS | Status: DC | PRN

## 2019-02-10 MED ORDER — TAMSULOSIN HCL 0.4 MG PO CAPS
0.4000 mg | ORAL_CAPSULE | ORAL | Status: DC
  Administered 2019-02-12: 0.4 mg via ORAL
  Filled 2019-02-10: qty 1

## 2019-02-10 MED ORDER — LORATADINE 10 MG PO TABS: 10.0000 mg | ORAL_TABLET | Freq: Every day | ORAL | Status: DC | PRN

## 2019-02-10 MED ORDER — ONDANSETRON HCL 4 MG PO TABS: 8.0000 mg | ORAL_TABLET | Freq: Three times a day (TID) | ORAL | Status: DC | PRN

## 2019-02-10 MED ORDER — FLUTICASONE PROPIONATE 50 MCG/ACT NA SUSP
2.0000 | Freq: Every day | NASAL | Status: DC | PRN
  Filled 2019-02-10: qty 16

## 2019-02-10 MED ORDER — DOCUSATE SODIUM 100 MG PO CAPS
100.0000 mg | ORAL_CAPSULE | Freq: Two times a day (BID) | ORAL | Status: DC
  Administered 2019-02-10 (×2): 100 mg via ORAL
  Filled 2019-02-10 (×3): qty 1

## 2019-02-10 MED ORDER — HYDROCOD POLST-CPM POLST ER 10-8 MG/5ML PO SUER: 5.0000 mL | Freq: Two times a day (BID) | ORAL | Status: DC | PRN

## 2019-02-10 MED ORDER — CHOLECALCIFEROL 10 MCG (400 UNIT) PO TABS
400.0000 [IU] | ORAL_TABLET | Freq: Every day | ORAL | Status: DC
  Administered 2019-02-12: 400 [IU] via ORAL
  Filled 2019-02-10 (×2): qty 1

## 2019-02-10 MED ORDER — ACETAMINOPHEN 325 MG PO TABS: 650.0000 mg | ORAL_TABLET | Freq: Four times a day (QID) | ORAL | Status: DC | PRN

## 2019-02-10 MED ORDER — SODIUM CHLORIDE 0.9% FLUSH: 3.0000 mL | INTRAVENOUS | Status: DC | PRN

## 2019-02-10 MED ORDER — BUSPIRONE HCL 10 MG PO TABS
5.0000 mg | ORAL_TABLET | Freq: Two times a day (BID) | ORAL | Status: DC
  Administered 2019-02-10 – 2019-02-12 (×3): 5 mg via ORAL
  Filled 2019-02-10 (×3): qty 1

## 2019-02-10 MED ORDER — BISACODYL 5 MG PO TBEC: 5.0000 mg | DELAYED_RELEASE_TABLET | Freq: Every day | ORAL | Status: DC | PRN

## 2019-02-10 MED ORDER — NITROGLYCERIN 0.4 MG SL SUBL: 0.4000 mg | SUBLINGUAL_TABLET | SUBLINGUAL | Status: DC | PRN

## 2019-02-10 MED ORDER — POLYETHYLENE GLYCOL 3350 17 G PO PACK: 17.0000 g | PACK | Freq: Every day | ORAL | Status: DC | PRN

## 2019-02-10 MED ORDER — CEFAZOLIN SODIUM-DEXTROSE 2-4 GM/100ML-% IV SOLN
2.0000 g | INTRAVENOUS | Status: AC
  Administered 2019-02-11: 2 g via INTRAVENOUS
  Filled 2019-02-10: qty 100

## 2019-02-10 MED ORDER — MONTELUKAST SODIUM 10 MG PO TABS
10.0000 mg | ORAL_TABLET | Freq: Every day | ORAL | Status: DC
  Administered 2019-02-10 – 2019-02-11 (×2): 10 mg via ORAL
  Filled 2019-02-10 (×2): qty 1

## 2019-02-10 MED ORDER — TIZANIDINE HCL 4 MG PO TABS
4.0000 mg | ORAL_TABLET | Freq: Every day | ORAL | Status: DC | PRN
  Filled 2019-02-10: qty 1

## 2019-02-10 MED ORDER — COQ10 100 MG PO CAPS: 1.0000 | ORAL_CAPSULE | Freq: Every day | ORAL | Status: DC

## 2019-02-10 MED ORDER — IPRATROPIUM-ALBUTEROL 0.5-2.5 (3) MG/3ML IN SOLN: 3.0000 mL | RESPIRATORY_TRACT | Status: DC | PRN

## 2019-02-10 MED ORDER — ACETAMINOPHEN 650 MG RE SUPP: 650.0000 mg | Freq: Four times a day (QID) | RECTAL | Status: DC | PRN

## 2019-02-10 MED ORDER — SODIUM CHLORIDE 0.9% FLUSH
3.0000 mL | Freq: Two times a day (BID) | INTRAVENOUS | Status: DC
  Administered 2019-02-10 – 2019-02-12 (×2): 3 mL via INTRAVENOUS

## 2019-02-10 MED ORDER — TEMAZEPAM 7.5 MG PO CAPS: 7.5000 mg | ORAL_CAPSULE | Freq: Every evening | ORAL | Status: DC | PRN

## 2019-02-10 MED ORDER — LOPERAMIDE HCL 2 MG PO CAPS
4.0000 mg | ORAL_CAPSULE | ORAL | Status: DC | PRN
  Filled 2019-02-10: qty 2

## 2019-02-10 NOTE — ED Notes (Signed)
X-ray at bedside

## 2019-02-10 NOTE — ED Notes (Signed)
Salary, MD at bedside.

## 2019-02-10 NOTE — ED Provider Notes (Signed)
[facility] Emergency Department Provider Note ____________________________________________  Time seen: Approximately 10:19 AM  I have reviewed the triage vital signs and the nursing notes.   HISTORY  Chief Complaint Leg Pain    HPI Glen Davis is a 78 y.o. male who presents to the emergency department for evaluation and treatment of left leg pain after mechanical, non-syncopal fall this morning while walking his dog. He denies striking his head and denies pain other than the left leg. Splint was applied prior to arrival.  Past Medical History:  Diagnosis Date  . CAD (coronary artery disease)   . Cancer ([place])    left arm  . COPD (chronic obstructive pulmonary disease) ([place])   . Hypertension   . Kidney stone   . Lung cancer ([place])   . Melanoma ([place]) 01/24/2016   right shoulder  . Thyroid nodule 02/02/2016   left BENIGN THYROID NODULE by FNA    Patient Active Problem List   Diagnosis Date Noted  . Unsp fracture of upper end of unsp tibia, init for clos fx 02/10/2019  . 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 Orthostatic hypotension 03/24/2018  . Acute sinusitis 02/06/2017  . Cough 10/09/2016  . SOB (shortness of breath) 09/13/2016  . Syncope 08/17/2016  . Situational anxiety 06/05/2016  . 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 Coronary artery disease involving native coronary artery of native heart without angina pectoris 03/16/2016  . Essential (primary) hypertension 03/16/2016  . Hypertriglyceridemia 03/16/2016  . Left thyroid nodule 02/03/2016  . Pulmonary nodules/lesions, multiple 02/03/2016  . Counseling regarding goals of care 01/30/2016  . Vaccine counseling 01/30/2016  . 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 Melanoma of skin ([place]) 01/12/2016  . Breathlessness on exertion 04/25/2015    Past Surgical History:  Procedure Laterality Date   . arm surgery Left    arm  . cardiac stents  2011   Angioplasty / Stenting Femoral-X2  . COLONOSCOPY  04/01/12  . CORONARY ANGIOPLASTY    . EXCISION MELANOMA WITH SENTINEL LYMPH NODE BIOPSY Right 01/24/2016   Procedure: EXCISION MELANOMA Right Shoulder;  Surgeon: Robert Bellow, MD;  Location: [facility] ORS;  Service: General;  Laterality: Right;  . LAPAROSCOPIC NEPHRECTOMY, HAND ASSISTED Left 04/24/2016   Procedure: HAND ASSISTED LAPAROSCOPIC NEPHRECTOMY;  Surgeon: Hollice Espy, MD;  Location: [facility] ORS;  Service: Urology;  Laterality: Left;  Marland Kitchen VIDEO ASSISTED THORACOSCOPY (VATS)/THOROCOTOMY Left 03/08/2016   Procedure: VIDEO ASSISTED THORACOSCOPY (VATS) with lung biopsy - Left ;  Surgeon: Robert Bellow, MD;  Location: [facility] ORS;  Service: General;  Laterality: Left;    Prior to Admission medications   Medication Sig Start Date End Date Taking? Authorizing Provider  aspirin EC 81 MG tablet Take 81 mg by mouth daily.   Yes [provider]  atorvastatin (LIPITOR) 40 MG tablet Take 40 mg by mouth daily.   Yes [provider]  busPIRone (BUSPAR) 5 MG tablet Take 5 mg by mouth 2 (two) times daily. 01/14/19  Yes [provider]  cholecalciferol (VITAMIN D) 400 units TABS tablet Take 400 Units by mouth daily.   Yes [provider]  clopidogrel (PLAVIX) 75 MG tablet Take 75 mg by mouth daily.   Yes [provider]  DULoxetine (CYMBALTA) 30 MG capsule Take 2 capsules by mouth daily. 08/05/17 10/08/20 Yes [provider]  fentaNYL (DURAGESIC) 50 MCG/HR Place 1 patch  onto the skin every 3 (three) days. 01/08/19  Yes Cammie Sickle, MD  metFORMIN (GLUCOPHAGE) 500 MG tablet Take 1 tablet (500 mg total) by mouth 2 (two) times daily with a meal. 10/20/18  Yes Cammie Sickle, MD  Misc Natural Products (GLUCOSAMINE CHOND COMPLEX/MSM) TABS Take 2 tablets by mouth daily.   Yes [provider]  montelukast (SINGULAIR) 10 MG tablet TAKE 1  TABLET BY MOUTH EVERYDAY AT BEDTIME 10/06/18  Yes Cammie Sickle, MD  Omega-3 Fatty Acids (FISH OIL) 1000 MG CPDR Take 1 capsule by mouth daily.   Yes [provider]  tamsulosin (FLOMAX) 0.4 MG CAPS capsule TAKE 1 CAPSULE (0.4 MG TOTAL) BY MOUTH EVERY MORNING. 07/03/18  Yes Verlon Au, NP  temazepam (RESTORIL) 7.5 MG capsule Take 1 capsule (7.5 mg total) by mouth at bedtime as needed for sleep. 12/17/18  Yes Borders, Kirt Boys, NP  Tiotropium Bromide Monohydrate (SPIRIVA RESPIMAT) 1.25 MCG/ACT AERS Inhale 1.25 Act into the lungs 2 (two) times daily. 02/17/18  Yes Cammie Sickle, MD  Turmeric 500 MG TABS Take 1 tablet by mouth daily.   Yes [provider]  albuterol (PROVENTIL HFA;VENTOLIN HFA) 108 (90 Base) MCG/ACT inhaler Inhale 2 puffs into the lungs every 6 (six) hours as needed for wheezing or shortness of breath. 02/17/18   Cammie Sickle, MD  Aspirin-Salicylamide-Caffeine (BC HEADACHE POWDER PO) Take 1 packet by mouth daily.    [provider]  chlorpheniramine-HYDROcodone (TUSSIONEX) 10-8 MG/5ML SUER Take 5 mLs by mouth every 12 (twelve) hours as needed for cough. Patient not taking: Reported on 02/10/2019 01/15/19   Jacquelin Hawking, NP  fluticasone [facility]) 50 MCG/ACT nasal spray Place 2 sprays into both nostrils daily. 12/27/17   Jacquelin Hawking, NP  Fluticasone-Salmeterol (ADVAIR DISKUS) 500-50 MCG/DOSE AEPB Inhale 1 puff into the lungs 2 (two) times daily. Patient not taking: Reported on 02/10/2019 02/17/18   Cammie Sickle, MD  HYDROmorphone (DILAUDID) 2 MG tablet Take 1 tablet (2 mg total) by mouth every 6 (six) hours as needed for moderate pain or severe pain. Patient not taking: Reported on 02/10/2019 01/08/19   Cammie Sickle, MD  ipratropium-albuterol (DUONEB) 0.5-2.5 (3) MG/3ML SOLN Take 3 mLs by nebulization every 4 (four) hours as needed. 04/04/17   Cammie Sickle, MD  loperamide (IMODIUM) 1 MG/5ML solution Take 4 mg  by mouth as needed for diarrhea or loose stools.    [provider]  loratadine (CLARITIN) 10 MG tablet Take 10 mg by mouth daily.    [provider]  MELATONIN PO Take 10 mg by mouth at bedtime.     [provider]  nitroGLYCERIN (NITROSTAT) 0.4 MG SL tablet Place 0.4 mg under the tongue every 5 (five) minutes as needed.  04/26/15   [provider]  ondansetron (ZOFRAN) 8 MG tablet Take 1 tablet (8 mg total) by mouth every 8 (eight) hours as needed for nausea or vomiting. Patient not taking: Reported on 02/10/2019 09/10/18   Cammie Sickle, MD  OXYGEN Inhale 2 L into the lungs at bedtime.    [provider]  tiZANidine (ZANAFLEX) 4 MG tablet Take 4 mg by mouth daily.  06/23/18   [provider]    Allergies Lisinopril and Other  Family History  Problem Relation Age of Onset  . Hodgkin's lymphoma Daughter 5  . Heart attack Father   . Kidney cancer Neg Hx   . Kidney disease Neg Hx   .  Prostate cancer Neg Hx     Social History Social History   Tobacco Use  . Smoking status: Former Smoker    Packs/day: 2.00    Years: 30.00    Pack years: 60.00    Types: Cigarettes    Start date: 01/18/1956    Last attempt to quit: 01/17/1969    Years since quitting: 50.0  . Smokeless tobacco: Never Used  Substance Use Topics  . Alcohol use: Not Currently    Alcohol/week: 0.0 standard drinks    Comment: BEER OCC  . Drug use: No    Review of Systems Constitutional: Negative for fever. Cardiovascular: Negative for chest pain. Respiratory: Negative for shortness of breath. Musculoskeletal: Positive for left lower extremity pain.  Skin: Positive for hematoma to the left leg.  Neurological: Negative for decrease in sensation  ____________________________________________   PHYSICAL EXAM:  VITAL SIGNS: ED Triage Vitals [02/10/19 0949]  Enc Vitals Group     BP 135/71     Pulse Rate 71     Resp 18     Temp (!) 97.4 F (36.3 C)      Temp Source Oral     SpO2 97 %     Weight 180 lb (81.6 kg)     Height 6\' 1"  (1.854 m)     Head Circumference      Peak Flow      Pain Score 10     Pain Loc      Pain Edu?      Excl. in [place]?     Constitutional: Alert and oriented. Well appearing and in no acute distress. Eyes: Conjunctivae are clear without discharge or drainage Head: Atraumatic Neck: Supple Respiratory: No cough. Respirations are even and unlabored. Musculoskeletal: Obvious deformity of the left lower extremity. No ankle or foot pain. FROM of the left foot. DP and PT pulses are 2+. Neurologic: Motor and sensory function of the left foot and ankle is intact.  Skin: Hematoma overlying the area of deformity without open wound.  Psychiatric: Affect and behavior are appropriate.  ____________________________________________   LABS (all labs ordered are listed, but only abnormal results are displayed)  Labs Reviewed  CBC WITH DIFFERENTIAL/PLATELET - Abnormal; Notable for the following components:      Result Value   Hemoglobin 12.9 (*)    RDW 16.3 (*)    Platelets 143 (*)    Lymphs Abs 0.6 (*)    All other components within normal limits  COMPREHENSIVE METABOLIC PANEL - Abnormal; Notable for the following components:   Glucose, Bld 153 (*)    All other components within normal limits  PROTIME-INR   ____________________________________________  RADIOLOGY  Comminuted fractures of the proximal tibia and fibula. Lucency in the distal tibia. ____________________________________________   PROCEDURES  .Ortho Injury Treatment Date/Time: 02/10/2019 10:54 AM Performed by: Victorino Dike, FNP Authorized by: Victorino Dike, FNP   Consent:    Consent obtained:  Verbal   Consent given by:  PatientInjury location: lower leg Location details: left lower leg Injury type: fracture Fracture type: tibial and fibular shafts Pre-procedure neurovascular assessment: neurovascularly intact Manipulation performed:  no Splint type: Knee immobilizer. Supplies used: cotton padding Post-procedure neurovascular assessment: post-procedure neurovascularly intact     ____________________________________________   INITIAL IMPRESSION / ASSESSMENT AND PLAN / ED COURSE  Glen Davis is a 78 y.o. who presents to the emergency department for treatment and evaluation after a leg injury.  X-ray shows comminuted fractures of the proximal tibia and  fibula and lucency in the distal tibia. Case discussed with Dr. Harlow Mares with orthopedics who requests that the patient be admitted through the Hospitalist service for surgical clearance due to underlying renal cancer with METs to Lung and bone. Dr. Jerelyn Charles agrees and will see the patient.   Medications  morphine 4 MG/ML injection 4 mg (4 mg Intravenous Given 02/10/19 1050)  ondansetron (ZOFRAN) injection 4 mg (4 mg Intravenous Given 02/10/19 1051)    Pertinent labs & imaging results that were available during my care of the patient were reviewed by me and considered in my medical decision making (see chart for details).  _________________________________________   FINAL CLINICAL IMPRESSION(S) / ED DIAGNOSES  Final diagnoses:  Tibia/fibula fracture, left, closed, initial encounter    ED Discharge Orders    None       If controlled substance prescribed during this visit, 12 month history viewed on the [place] prior to issuing an initial prescription for Schedule II or III opiod.   Victorino Dike, FNP 02/10/19 1128    Earleen Newport, MD 02/10/19 1421

## 2019-02-10 NOTE — H&P (Signed)
[facility] NAME: Glen Davis    MR#:  [id number]  DATE OF BIRTH:  [birth year]  DATE OF ADMISSION:  02/10/2019  PRIMARY CARE PHYSICIAN: Dion Body, MD   REQUESTING/REFERRING PHYSICIAN:   CHIEF COMPLAINT:   Chief Complaint  Patient presents with  . Leg Pain    HISTORY OF PRESENT ILLNESS: Glen Davis  is a 78 y.o. 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 Imagene Riches surgery who is planning surgery on tomorrow, patient evaluated in the emergency room, no apparent distress, complains of pain only, patient is now admitted for acute left tib-fib fracture status post mechanical fall.  PAST MEDICAL HISTORY:   Past Medical History:  Diagnosis Date  . CAD (coronary artery disease)   . Cancer ([place])    left arm  . COPD (chronic obstructive pulmonary disease) ([place])   . Hypertension   . Kidney stone   . Lung cancer ([place])   . Melanoma ([place]) 01/24/2016   right shoulder  . Thyroid nodule 02/02/2016   left BENIGN THYROID NODULE by FNA    PAST SURGICAL HISTORY:  Past Surgical History:  Procedure Laterality Date  . arm surgery Left    arm  . cardiac stents  2011   Angioplasty / Stenting Femoral-X2  . COLONOSCOPY  04/01/12  . CORONARY ANGIOPLASTY    . EXCISION MELANOMA WITH SENTINEL LYMPH NODE BIOPSY Right 01/24/2016   Procedure: EXCISION MELANOMA Right Shoulder;  Surgeon: Robert Bellow, MD;  Location: [facility] ORS;  Service: General;  Laterality: Right;  . LAPAROSCOPIC NEPHRECTOMY, HAND ASSISTED Left 04/24/2016   Procedure: HAND ASSISTED LAPAROSCOPIC NEPHRECTOMY;  Surgeon: Hollice Espy, MD;  Location: [facility] ORS;  Service: Urology;   Laterality: Left;  Marland Kitchen VIDEO ASSISTED THORACOSCOPY (VATS)/THOROCOTOMY Left 03/08/2016   Procedure: VIDEO ASSISTED THORACOSCOPY (VATS) with lung biopsy - Left ;  Surgeon: Robert Bellow, MD;  Location: [facility] ORS;  Service: General;  Laterality: Left;    SOCIAL HISTORY:  Social History   Tobacco Use  . Smoking status: Former Smoker    Packs/day: 2.00    Years: 30.00    Pack years: 60.00    Types: Cigarettes    Start date: 01/18/1956    Last attempt to quit: 01/17/1969    Years since quitting: 50.0  . Smokeless tobacco: Never Used  Substance Use Topics  . Alcohol use: Not Currently    Alcohol/week: 0.0 standard drinks    Comment: BEER OCC    FAMILY HISTORY:  Family History  Problem Relation Age of Onset  . Hodgkin's lymphoma Daughter 5  . Heart attack Father   . Kidney cancer Neg Hx   . Kidney disease Neg Hx   . Prostate cancer Neg Hx     DRUG ALLERGIES:  Allergies  Allergen Reactions  . Lisinopril Swelling  . Other Itching    Nitroglycerin Patch.    REVIEW OF SYSTEMS:   CONSTITUTIONAL: No fever, fatigue or weakness.  EYES: No blurred or double vision.  EARS, NOSE, AND THROAT: No tinnitus or ear pain.  RESPIRATORY: No cough, shortness of breath, wheezing or hemoptysis.  CARDIOVASCULAR: No chest pain, orthopnea, edema.  GASTROINTESTINAL: No nausea, vomiting, diarrhea or abdominal pain.  GENITOURINARY:  No dysuria, hematuria.  ENDOCRINE: No polyuria, nocturia,  HEMATOLOGY: No anemia, easy bruising or bleeding SKIN: No rash or lesion. MUSCULOSKELETAL: Left leg fracture, pain NEUROLOGIC: No tingling, numbness, weakness.  PSYCHIATRY: No anxiety or depression.   MEDICATIONS AT HOME:  Prior to Admission medications   Medication Sig Start Date End Date Taking? Authorizing Provider  albuterol (PROVENTIL HFA;VENTOLIN HFA) 108 (90 Base) MCG/ACT inhaler Inhale 2 puffs into the lungs every 6 (six) hours as needed for wheezing or shortness of breath. 02/17/18   Cammie Sickle, MD  aspirin EC 81 MG tablet Take 81 mg by mouth daily.    [provider]  Aspirin-Salicylamide-Caffeine (BC HEADACHE POWDER PO) Take 1 packet by mouth daily.    [provider]  atorvastatin (LIPITOR) 40 MG tablet Take 40 mg by mouth daily.    [provider]  busPIRone (BUSPAR) 5 MG tablet Take 5 mg by mouth 2 (two) times daily. 01/14/19   [provider]  chlorpheniramine-HYDROcodone (TUSSIONEX) 10-8 MG/5ML SUER Take 5 mLs by mouth every 12 (twelve) hours as needed for cough. 01/15/19   Jacquelin Hawking, NP  cholecalciferol (VITAMIN D) 400 units TABS tablet Take 400 Units by mouth daily.    [provider]  clopidogrel (PLAVIX) 75 MG tablet Take 75 mg by mouth daily.    [provider]  Coenzyme Q10 (COQ10) 100 MG CAPS Take 1 capsule by mouth daily.    [provider]  DULoxetine (CYMBALTA) 30 MG capsule Take 2 capsules by mouth daily. 08/05/17 10/08/20  [provider]  fentaNYL (DURAGESIC) 50 MCG/HR Place 1 patch onto the skin every 3 (three) days. 01/08/19   Cammie Sickle, MD  fluticasone (FLONASE) 50 MCG/ACT nasal spray Place 2 sprays into both nostrils daily. 12/27/17   Jacquelin Hawking, NP  Fluticasone-Salmeterol (ADVAIR DISKUS) 500-50 MCG/DOSE AEPB Inhale 1 puff into the lungs 2 (two) times daily. 02/17/18   Cammie Sickle, MD  HYDROmorphone (DILAUDID) 2 MG tablet Take 1 tablet (2 mg total) by mouth every 6 (six) hours as needed for moderate pain or severe pain. 01/08/19   Cammie Sickle, MD  ipratropium-albuterol (DUONEB) 0.5-2.5 (3) MG/3ML SOLN Take 3 mLs by nebulization every 4 (four) hours as needed. 04/04/17   Cammie Sickle, MD  loperamide (IMODIUM) 1 MG/5ML solution Take 4 mg by mouth as needed for diarrhea or loose stools.    [provider]  loratadine (CLARITIN) 10 MG tablet Take 10 mg by mouth daily.    [provider]  MELATONIN PO Take 10 mg by mouth at bedtime.      [provider]  metFORMIN (GLUCOPHAGE) 500 MG tablet Take 1 tablet (500 mg total) by mouth 2 (two) times daily with a meal. 10/20/18   Cammie Sickle, MD  Misc Natural Products (GLUCOSAMINE CHOND COMPLEX/MSM) TABS Take 2 tablets by mouth daily.    [provider]  montelukast (SINGULAIR) 10 MG tablet TAKE 1 TABLET BY MOUTH EVERYDAY AT BEDTIME 10/06/18   Cammie Sickle, MD  nitroGLYCERIN (NITROSTAT) 0.4 MG SL tablet Place 0.4 mg under the tongue every 5 (five) minutes as needed.  04/26/15   [provider]  Omega-3 Fatty Acids (FISH OIL) 1000 MG CPDR Take 1 capsule by mouth daily.    [provider]  ondansetron (ZOFRAN) 8 MG tablet Take 1 tablet (8 mg total) by mouth every 8 (eight) hours as needed for nausea or vomiting. 09/10/18   Charlaine Dalton  R, MD  OXYGEN Inhale 2 L into the lungs at bedtime.    [provider]  predniSONE (STERAPRED UNI-PAK 21 TAB) 10 MG (21) TBPK tablet Take as directed. 01/15/19   Jacquelin Hawking, NP  tamsulosin (FLOMAX) 0.4 MG CAPS capsule TAKE 1 CAPSULE (0.4 MG TOTAL) BY MOUTH EVERY MORNING. 07/03/18   Verlon Au, NP  temazepam (RESTORIL) 7.5 MG capsule Take 1 capsule (7.5 mg total) by mouth at bedtime as needed for sleep. 12/17/18   Borders, Kirt Boys, NP  Tiotropium Bromide Monohydrate (SPIRIVA RESPIMAT) 1.25 MCG/ACT AERS Inhale 1.25 Act into the lungs 2 (two) times daily. 02/17/18   Cammie Sickle, MD  tiZANidine (ZANAFLEX) 4 MG tablet Take 4 mg by mouth daily.  06/23/18   [provider]  Turmeric 500 MG TABS Take 1 tablet by mouth daily.    [provider]      PHYSICAL EXAMINATION:   VITAL SIGNS: Blood pressure 135/71, pulse 71, temperature (!) 97.4 F (36.3 C), temperature source Oral, resp. rate 18, height 6\' 1"  (1.854 m), weight 81.6 kg, SpO2 97 %.  GENERAL:  78 y.o.-year-old patient lying in the bed with mild acute distress.  EYES: Pupils equal, round, reactive to light  and accommodation. No scleral icterus. Extraocular muscles intact.  HEENT: Head atraumatic, normocephalic. Oropharynx and nasopharynx clear.  NECK:  Supple, no jugular venous distention. No thyroid enlargement, no tenderness.  LUNGS: Normal breath sounds bilaterally, no wheezing, rales,rhonchi or crepitation. No use of accessory muscles of respiration.  CARDIOVASCULAR: S1, S2 normal. No murmurs, rubs, or gallops.  ABDOMEN: Soft, nontender, nondistended. Bowel sounds present. No organomegaly or mass.  EXTREMITIES: Left lower extremity deformity, splint in place, decreased range of motion NEUROLOGIC: Cranial nerves II through XII are intact. MAES. Sensation intact. Gait not checked.  PSYCHIATRIC: The patient is alert and oriented x 3.  SKIN: No obvious rash, lesion, or ulcer.   LABORATORY PANEL:   CBC No results for input(s): WBC, HGB, HCT, PLT, MCV, MCH, MCHC, RDW, LYMPHSABS, MONOABS, EOSABS, BASOSABS, BANDABS in the last 168 hours.  Invalid input(s): NEUTRABS, BANDSABD ------------------------------------------------------------------------------------------------------------------  Chemistries  No results for input(s): NA, K, CL, CO2, GLUCOSE, BUN, CREATININE, CALCIUM, MG, AST, ALT, ALKPHOS, BILITOT in the last 168 hours.  Invalid input(s): GFRCGP ------------------------------------------------------------------------------------------------------------------ estimated creatinine clearance is 57.3 mL/min (by C-G formula based on SCr of 1.22 mg/dL). ------------------------------------------------------------------------------------------------------------------ No results for input(s): TSH, T4TOTAL, T3FREE, THYROIDAB in the last 72 hours.  Invalid input(s): FREET3   Coagulation profile No results for input(s): INR, PROTIME in the last 168 hours. ------------------------------------------------------------------------------------------------------------------- No results for  input(s): DDIMER in the last 72 hours. -------------------------------------------------------------------------------------------------------------------  Cardiac Enzymes No results for input(s): CKMB, TROPONINI, MYOGLOBIN in the last 168 hours.  Invalid input(s): CK ------------------------------------------------------------------------------------------------------------------ Invalid input(s): POCBNP  ---------------------------------------------------------------------------------------------------------------  Urinalysis    Component Value Date/Time   COLORURINE YELLOW (A) 04/10/2018 1355   APPEARANCEUR HAZY (A) 04/10/2018 1355   APPEARANCEUR Clear 03/29/2016 1518   LABSPEC 1.016 04/10/2018 1355   PHURINE 5.0 04/10/2018 1355   GLUCOSEU NEGATIVE 04/10/2018 1355   HGBUR SMALL (A) 04/10/2018 1355   BILIRUBINUR NEGATIVE 04/10/2018 1355   BILIRUBINUR Negative 03/29/2016 1518   KETONESUR NEGATIVE 04/10/2018 1355   PROTEINUR 30 (A) 04/10/2018 1355   NITRITE NEGATIVE 04/10/2018 1355   LEUKOCYTESUR NEGATIVE 04/10/2018 1355   LEUKOCYTESUR Negative 03/29/2016 1518     RADIOLOGY: Dg Tibia/fibula Left  Result Date: 02/10/2019 CLINICAL DATA:  Pain following fall EXAM: LEFT TIBIA AND FIBULA -  2 VIEW COMPARISON:  September 06, 2017 FINDINGS: Frontal and lateral views were obtained. There is a comminuted fracture at the junction of the proximal and mid thirds of the left tibia. There is mild lateral angulation and posterior displacement of the distal major fracture fragment with respect proximal fragment. There is a comminuted fracture of the proximal fibular diaphysis with mild posterior displacement distally. No other fractures are evident. No dislocations. There is extensive osteoarthritic change medially in the knee joint region. There is a focal lucent area in the distal tibial diaphysis measuring 1.8 x 1.5 cm. No similar changes elsewhere. IMPRESSION: 1. Comminuted fractures of the  proximal tibia and fibula as noted. No dislocation. 2. 1.8 x 1.5 cm lucent area in the distal tibia. Etiology for this lesion uncertain. It was not appreciable on prior study from 2018. A neoplastic focus must be of concern given this change since the 2018 study. Nonemergent MR of the distal tibia region is probably the optimum study of choice to further evaluate this area from an imaging standpoint. 3.  Extensive knee joint arthropathy, particularly medially. Electronically Signed   By: Lowella Grip III M.D.   On: 02/10/2019 10:30    EKG: Orders placed or performed during the hospital encounter of 04/21/18  . ED EKG  . ED EKG  . EKG    IMPRESSION AND PLAN: *Acute left tib-fib fracture with deformity Secondary to mechanical fall Admit to regular nursing for bed, n.p.o. after midnight, Dr. 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 Rogue Bussing consulted for continuity of care  *Acute on chronic cancer pain Secondary to above Continue fentanyl patch, IV Dilaudid as needed severe pain, continue home muscle relaxants, Cymbalta  *COPD without exacerbation Stable Continue home regiment  *Chronic hypoxic respiratory failure Stable On 2 L at bedtime only which will be continued while in house  *Chronic BPH Stable Continue Flomax  All the records are reviewed and case discussed with ED provider. Management plans discussed with the patient, family and they are in agreement.  CODE STATUS:full Code Status History    Date Active Date Inactive Code Status Order ID Comments User Context   04/22/2018 0836 04/23/2018 1437 DNR [id number]  Dustin Flock, MD Inpatient   04/21/2018 1437 04/22/2018 0836 Full Code [id number]  Henreitta Leber, MD Inpatient   03/08/2016 1329 03/10/2016 1421 Full Code [id number]  Robert Bellow, MD Inpatient    Questions for Most Recent Historical Code Status (Order  [id number])    Question Answer Comment   In the event of cardiac or respiratory ARREST Do not call a "code blue"    In the event of cardiac or respiratory ARREST Do not perform Intubation, CPR, defibrillation or ACLS    In the event of cardiac or respiratory ARREST Use medication by any route, position, wound care, and other measures to relive pain and suffering. May use oxygen, suction and manual treatment of airway obstruction as needed for comfort.         Advance Directive Documentation     Most Recent Value  Type of Advance Directive  Healthcare Power of Attorney, Living will  Pre-existing out of facility DNR order (yellow form or pink MOST form)  -  "MOST" Form in Place?  -       TOTAL TIME TAKING CARE OF THIS PATIENT: 45 minutes.    Avel Peace Salary M.D on 02/10/2019   Between 7am to  6pm - Pager - [phone]  After 6pm go to [email or web address] - password [email or web address] [facility]  Office  [phone]  CC: Primary care physician; Dion Body, MD   Note: This dictation was prepared with Dragon dictation along with smaller phrase technology. Any transcriptional errors that result from this process are unintentional.

## 2019-02-10 NOTE — ED Notes (Addendum)
ED TO INPATIENT HANDOFF REPORT  ED Nurse Name and Phone #: Denney Shein, RN   S Name/Age/Gender Glen Davis 78 y.o. male Room/Bed: ED41A/ED41A  Code Status   Code Status: Prior  Home/SNF/Other Home Patient oriented to: self, place, time and situation Is this baseline? Yes   Triage Complete: Triage complete  Chief Complaint leg injury  Triage Note Pt presents via pov from home after falling and breaking his leg. Pt brought in by son who is [employer] firefighter with leg splinted. Pt denies hitting head or loc. Pt alert & oriented with NAD noted.    Allergies Allergies  Allergen Reactions  . Lisinopril Swelling  . Other Itching    Nitroglycerin Patch.    Level of Care/Admitting Diagnosis ED Disposition    ED Disposition Condition [name] Area: [place] [[id number]]  Level of Care: Med-Surg [16]  Diagnosis: Unsp fracture of upper end of unsp tibia, init for clos fx [[id number]]  Admitting Physician: Gorden Harms [[id number]]  Attending Physician: Gorden Harms [[id number]]  Estimated length of stay: past midnight tomorrow  Certification:: I certify this patient will need inpatient services for at least 2 midnights  PT Class (Do Not Modify): Inpatient [101]  PT Acc Code (Do Not Modify): Private [1]       B Medical/Surgery History Past Medical History:  Diagnosis Date  . CAD (coronary artery disease)   . Cancer ([place])    left arm  . COPD (chronic obstructive pulmonary disease) ([place])   . Hypertension   . Kidney stone   . Lung cancer ([place])   . Melanoma ([place]) 01/24/2016   right shoulder  . Thyroid nodule 02/02/2016   left BENIGN THYROID NODULE by FNA   Past Surgical History:  Procedure Laterality Date  . arm surgery Left    arm  . cardiac stents  2011   Angioplasty / Stenting Femoral-X2  . COLONOSCOPY  04/01/12  . CORONARY ANGIOPLASTY    . EXCISION MELANOMA WITH SENTINEL LYMPH NODE BIOPSY Right 01/24/2016   Procedure:  EXCISION MELANOMA Right Shoulder;  Surgeon: Robert Bellow, MD;  Location: [facility] ORS;  Service: General;  Laterality: Right;  . LAPAROSCOPIC NEPHRECTOMY, HAND ASSISTED Left 04/24/2016   Procedure: HAND ASSISTED LAPAROSCOPIC NEPHRECTOMY;  Surgeon: Hollice Espy, MD;  Location: [facility] ORS;  Service: Urology;  Laterality: Left;  Marland Kitchen VIDEO ASSISTED THORACOSCOPY (VATS)/THOROCOTOMY Left 03/08/2016   Procedure: VIDEO ASSISTED THORACOSCOPY (VATS) with lung biopsy - Left ;  Surgeon: Robert Bellow, MD;  Location: [facility] ORS;  Service: General;  Laterality: Left;     A IV Location/Drains/Wounds Patient Lines/Drains/Airways Status   Active Line/Drains/Airways    Name:   Placement date:   Placement time:   Site:   Days:   Peripheral IV 02/10/19 Right Antecubital   02/10/19    1050    Antecubital   less than 1          Intake/Output Last 24 hours No intake or output data in the 24 hours ending 02/10/19 1117  Labs/Imaging Results for orders placed or performed during the hospital encounter of 02/10/19 (from the past 48 hour(s))  CBC with Differential     Status: Abnormal   Collection Time: 02/10/19 10:43 AM  Result Value Ref Range   WBC 5.3 4.0 - 10.5 K/uL   RBC 4.26 4.22 - 5.81 MIL/uL   Hemoglobin 12.9 (L) 13.0 - 17.0 g/dL   HCT 39.2 39.0 - 52.0 %   MCV  92.0 80.0 - 100.0 fL   MCH 30.3 26.0 - 34.0 pg   MCHC 32.9 30.0 - 36.0 g/dL   RDW 16.3 (H) 11.5 - 15.5 %   Platelets 143 (L) 150 - 400 K/uL   nRBC 0.0 0.0 - 0.2 %   Neutrophils Relative % 71 %   Neutro Abs 3.8 1.7 - 7.7 K/uL   Lymphocytes Relative 12 %   Lymphs Abs 0.6 (L) 0.7 - 4.0 K/uL   Monocytes Relative 12 %   Monocytes Absolute 0.6 0.1 - 1.0 K/uL   Eosinophils Relative 5 %   Eosinophils Absolute 0.3 0.0 - 0.5 K/uL   Basophils Relative 0 %   Basophils Absolute 0.0 0.0 - 0.1 K/uL   Immature Granulocytes 0 %   Abs Immature Granulocytes 0.01 0.00 - 0.07 K/uL    Comment: Performed at [facility], [address]  [place], [place] [address]  Protime-INR     Status: None   Collection Time: 02/10/19 10:43 AM  Result Value Ref Range   Prothrombin Time 12.8 11.4 - 15.2 seconds   INR 1.0 0.8 - 1.2    Comment: (NOTE) INR goal varies based on device and disease states. Performed at [facility], [address] [place], [place] [address]    Dg Tibia/fibula Left  Result Date: 02/10/2019 CLINICAL DATA:  Pain following fall EXAM: LEFT TIBIA AND FIBULA - 2 VIEW COMPARISON:  September 06, 2017 FINDINGS: Frontal and lateral views were obtained. There is a comminuted fracture at the junction of the proximal and mid thirds of the left tibia. There is mild lateral angulation and posterior displacement of the distal major fracture fragment with respect proximal fragment. There is a comminuted fracture of the proximal fibular diaphysis with mild posterior displacement distally. No other fractures are evident. No dislocations. There is extensive osteoarthritic change medially in the knee joint region. There is a focal lucent area in the distal tibial diaphysis measuring 1.8 x 1.5 cm. No similar changes elsewhere. IMPRESSION: 1. Comminuted fractures of the proximal tibia and fibula as noted. No dislocation. 2. 1.8 x 1.5 cm lucent area in the distal tibia. Etiology for this lesion uncertain. It was not appreciable on prior study from 2018. A neoplastic focus must be of concern given this change since the 2018 study. Nonemergent MR of the distal tibia region is probably the optimum study of choice to further evaluate this area from an imaging standpoint. 3.  Extensive knee joint arthropathy, particularly medially. Electronically Signed   By: Lowella Grip III M.D.   On: 02/10/2019 10:30    Pending Labs Unresulted Labs (From admission, onward)    Start     Ordered   02/10/19 1031  Comprehensive metabolic panel  Once,   STAT     02/10/19 1031          Vitals/Pain Today's Vitals   02/10/19 0949 02/10/19 1049  02/10/19 1115  BP: 135/71    Pulse: 71    Resp: 18    Temp: (!) 97.4 F (36.3 C)    TempSrc: Oral    SpO2: 97%    Weight: 180 lb (81.6 kg)    Height: 6\' 1"  (1.854 m)    PainSc: 10-Worst pain ever 9  3     Isolation Precautions No active isolations  Medications Medications  morphine 4 MG/ML injection 4 mg (4 mg Intravenous Given 02/10/19 1050)  ondansetron (ZOFRAN) injection 4 mg (4 mg Intravenous Given 02/10/19 1051)  Mobility walks Low fall risk   Focused Assessments 4/5 strength in left leg, +2 pedal pulses, brusing to tibia   R Recommendations: See Admitting Provider Note  Report given to: Daleen Snook, Rn   Additional Notes:

## 2019-02-10 NOTE — Plan of Care (Signed)

## 2019-02-10 NOTE — ED Notes (Signed)
Knee immobilizer applied

## 2019-02-10 NOTE — ED Triage Notes (Signed)
Pt presents via pov from home after falling and breaking his leg. Pt brought in by son who is [place] firefighter with leg splinted. Pt denies hitting head or loc. Pt alert & oriented with NAD noted.

## 2019-02-10 NOTE — Progress Notes (Signed)
Family Meeting Note  Advance Directive:yes  Today a meeting took place with the Patient.  Patient is able to participate   The following clinical team members were present during this meeting:MD  The following were discussed:Patient's diagnosis: Chronic metastatic renal cell cancer with mets to lung/bone, cancer pain, acute probable left tib-fib pathological leg fracture status post mechanical fall, Patient's progosis: Unable to determine and Goals for treatment: Full Code  Additional follow-up to be provided: prn  Time spent during discussion:20 minutes  Gorden Harms, MD

## 2019-02-11 ENCOUNTER — Encounter: Payer: Self-pay | Admitting: Orthopedic Surgery

## 2019-02-11 ENCOUNTER — Inpatient Hospital Stay: Payer: PPO

## 2019-02-11 ENCOUNTER — Inpatient Hospital Stay: Payer: PPO | Admitting: Anesthesiology

## 2019-02-11 ENCOUNTER — Encounter
Admission: EM | Disposition: A | Discharge status: Home Health | Payer: Self-pay | Source: Home / Self Care | Attending: Specialist

## 2019-02-11 DIAGNOSIS — C642 Malignant neoplasm of left kidney, except renal pelvis: Secondary | ICD-10-CM

## 2019-02-11 DIAGNOSIS — C7951 Secondary malignant neoplasm of bone: Secondary | ICD-10-CM

## 2019-02-11 DIAGNOSIS — G893 Neoplasm related pain (acute) (chronic): Secondary | ICD-10-CM

## 2019-02-11 DIAGNOSIS — W010XXA Fall on same level from slipping, tripping and stumbling without subsequent striking against object, initial encounter: Secondary | ICD-10-CM

## 2019-02-11 DIAGNOSIS — C7800 Secondary malignant neoplasm of unspecified lung: Secondary | ICD-10-CM

## 2019-02-11 DIAGNOSIS — S82832A Other fracture of upper and lower end of left fibula, initial encounter for closed fracture: Secondary | ICD-10-CM

## 2019-02-11 DIAGNOSIS — S82202A Unspecified fracture of shaft of left tibia, initial encounter for closed fracture: Secondary | ICD-10-CM | POA: Diagnosis not present

## 2019-02-11 HISTORY — PX: TIBIA IM NAIL INSERTION: SHX2516

## 2019-02-11 LAB — SURGICAL PCR SCREEN
MRSA, PCR: POSITIVE — AB
Staphylococcus aureus: POSITIVE — AB

## 2019-02-11 SURGERY — INSERTION, INTRAMEDULLARY ROD, TIBIA
Anesthesia: Spinal | Site: Knee | Laterality: Left

## 2019-02-11 MED ORDER — PROPOFOL 10 MG/ML IV BOLUS
INTRAVENOUS | Status: AC
  Filled 2019-02-11: qty 20

## 2019-02-11 MED ORDER — FENTANYL CITRATE (PF) 100 MCG/2ML IJ SOLN
INTRAMUSCULAR | Status: DC | PRN
  Administered 2019-02-11: 50 ug via INTRAVENOUS
  Administered 2019-02-11: 100 ug via INTRAVENOUS
  Administered 2019-02-11: 50 ug via INTRAVENOUS

## 2019-02-11 MED ORDER — ONDANSETRON HCL 4 MG/2ML IJ SOLN: 4.0000 mg | Freq: Once | INTRAMUSCULAR | Status: DC | PRN

## 2019-02-11 MED ORDER — SUGAMMADEX SODIUM 200 MG/2ML IV SOLN
INTRAVENOUS | Status: DC | PRN
  Administered 2019-02-11: 170 mg via INTRAVENOUS

## 2019-02-11 MED ORDER — SEVOFLURANE IN SOLN
RESPIRATORY_TRACT | Status: AC
  Filled 2019-02-11: qty 250

## 2019-02-11 MED ORDER — ACETAMINOPHEN 325 MG PO TABS
ORAL_TABLET | ORAL | Status: AC
  Filled 2019-02-11: qty 2

## 2019-02-11 MED ORDER — PHENYLEPHRINE HCL 10 MG/ML IJ SOLN
INTRAMUSCULAR | Status: AC
  Filled 2019-02-11: qty 1

## 2019-02-11 MED ORDER — ACETAMINOPHEN 325 MG PO TABS
650.0000 mg | ORAL_TABLET | Freq: Once | ORAL | Status: AC
  Administered 2019-02-11: 650 mg via ORAL

## 2019-02-11 MED ORDER — DEXAMETHASONE SODIUM PHOSPHATE 4 MG/ML IJ SOLN
INTRAMUSCULAR | Status: AC
  Filled 2019-02-11: qty 1

## 2019-02-11 MED ORDER — CEFAZOLIN SODIUM-DEXTROSE 2-4 GM/100ML-% IV SOLN
2.0000 g | Freq: Four times a day (QID) | INTRAVENOUS | Status: AC
  Administered 2019-02-11 – 2019-02-12 (×3): 2 g via INTRAVENOUS
  Filled 2019-02-11 (×3): qty 100

## 2019-02-11 MED ORDER — ONDANSETRON HCL 4 MG/2ML IJ SOLN
INTRAMUSCULAR | Status: DC | PRN
  Administered 2019-02-11: 4 mg via INTRAVENOUS

## 2019-02-11 MED ORDER — PHENYLEPHRINE HCL 10 MG/ML IJ SOLN
INTRAMUSCULAR | Status: DC | PRN
  Administered 2019-02-11: 100 ug via INTRAVENOUS
  Administered 2019-02-11: 150 ug via INTRAVENOUS
  Administered 2019-02-11 (×3): 100 ug via INTRAVENOUS

## 2019-02-11 MED ORDER — SODIUM CHLORIDE 0.9 % IV SOLN
INTRAVENOUS | Status: DC | PRN
  Administered 2019-02-11: 20 ug/min via INTRAVENOUS

## 2019-02-11 MED ORDER — EPHEDRINE SULFATE 50 MG/ML IJ SOLN
INTRAMUSCULAR | Status: DC | PRN
  Administered 2019-02-11: 20 mg via INTRAVENOUS

## 2019-02-11 MED ORDER — FENTANYL CITRATE (PF) 100 MCG/2ML IJ SOLN
INTRAMUSCULAR | Status: AC
  Filled 2019-02-11: qty 2

## 2019-02-11 MED ORDER — METOCLOPRAMIDE HCL 10 MG PO TABS: 5.0000 mg | ORAL_TABLET | Freq: Three times a day (TID) | ORAL | Status: DC | PRN

## 2019-02-11 MED ORDER — LIDOCAINE HCL (PF) 2 % IJ SOLN
INTRAMUSCULAR | Status: AC
  Filled 2019-02-11: qty 10

## 2019-02-11 MED ORDER — EPHEDRINE SULFATE 50 MG/ML IJ SOLN
INTRAMUSCULAR | Status: AC
  Filled 2019-02-11: qty 1

## 2019-02-11 MED ORDER — ENSURE ENLIVE PO LIQD
237.0000 mL | Freq: Two times a day (BID) | ORAL | Status: DC
  Administered 2019-02-12: 237 mL via ORAL
  Filled 2019-02-11: qty 237

## 2019-02-11 MED ORDER — ONDANSETRON HCL 4 MG/2ML IJ SOLN
INTRAMUSCULAR | Status: AC
  Filled 2019-02-11: qty 2

## 2019-02-11 MED ORDER — LIDOCAINE HCL (CARDIAC) PF 100 MG/5ML IV SOSY
PREFILLED_SYRINGE | INTRAVENOUS | Status: DC | PRN
  Administered 2019-02-11: 80 mg via INTRAVENOUS

## 2019-02-11 MED ORDER — SODIUM CHLORIDE 0.9 % IR SOLN
Status: DC | PRN
  Administered 2019-02-11: 11:00:00

## 2019-02-11 MED ORDER — FENTANYL CITRATE (PF) 100 MCG/2ML IJ SOLN
25.0000 ug | INTRAMUSCULAR | Status: DC | PRN
  Administered 2019-02-11 (×2): 25 ug via INTRAVENOUS

## 2019-02-11 MED ORDER — LACTATED RINGERS IV SOLN
INTRAVENOUS | Status: DC
  Administered 2019-02-11 (×2): via INTRAVENOUS

## 2019-02-11 MED ORDER — ROCURONIUM BROMIDE 100 MG/10ML IV SOLN
INTRAVENOUS | Status: DC | PRN
  Administered 2019-02-11: 45 mg via INTRAVENOUS
  Administered 2019-02-11: 10 mg via INTRAVENOUS
  Administered 2019-02-11: 5 mg via INTRAVENOUS

## 2019-02-11 MED ORDER — SUCCINYLCHOLINE CHLORIDE 20 MG/ML IJ SOLN
INTRAMUSCULAR | Status: AC
  Filled 2019-02-11: qty 1

## 2019-02-11 MED ORDER — LACTATED RINGERS IV SOLN
INTRAVENOUS | Status: DC
  Administered 2019-02-11: 17:00:00 via INTRAVENOUS

## 2019-02-11 MED ORDER — SUGAMMADEX SODIUM 200 MG/2ML IV SOLN
INTRAVENOUS | Status: AC
  Filled 2019-02-11: qty 2

## 2019-02-11 MED ORDER — METOCLOPRAMIDE HCL 5 MG/ML IJ SOLN: 5.0000 mg | Freq: Three times a day (TID) | INTRAMUSCULAR | Status: DC | PRN

## 2019-02-11 MED ORDER — SUCCINYLCHOLINE CHLORIDE 20 MG/ML IJ SOLN
INTRAMUSCULAR | Status: DC | PRN
  Administered 2019-02-11: 100 mg via INTRAVENOUS

## 2019-02-11 MED ORDER — BUPIVACAINE HCL (PF) 0.5 % IJ SOLN
INTRAMUSCULAR | Status: DC | PRN
  Administered 2019-02-11: 20 mL

## 2019-02-11 MED ORDER — DOCUSATE SODIUM 100 MG PO CAPS
100.0000 mg | ORAL_CAPSULE | Freq: Two times a day (BID) | ORAL | Status: DC
  Administered 2019-02-11 – 2019-02-12 (×2): 100 mg via ORAL
  Filled 2019-02-11: qty 1

## 2019-02-11 MED ORDER — PROPOFOL 10 MG/ML IV BOLUS
INTRAVENOUS | Status: DC | PRN
  Administered 2019-02-11: 50 mg via INTRAVENOUS
  Administered 2019-02-11: 150 mg via INTRAVENOUS

## 2019-02-11 SURGICAL SUPPLY — 57 items
BIT DRILL CAL 3.2 LONG (BIT) ×1 IMPLANT
BIT DRILL CAL 3.2MM LONG (BIT) ×1
BIT DRILL SHORT 3.2MM (DRILL) IMPLANT
BLADE CLIPPER SURG (BLADE) ×2 IMPLANT
BNDG COHESIVE 4X5 TAN STRL (GAUZE/BANDAGES/DRESSINGS) ×3 IMPLANT
BNDG ESMARK 6X12 TAN STRL LF (GAUZE/BANDAGES/DRESSINGS) ×3 IMPLANT
BRUSH SCRUB EZ  4% CHG (MISCELLANEOUS) ×2
BRUSH SCRUB EZ 4% CHG (MISCELLANEOUS) ×1 IMPLANT
CANISTER SUCT 1200ML W/VALVE (MISCELLANEOUS) ×3 IMPLANT
CHLORAPREP W/TINT 26 (MISCELLANEOUS) ×5 IMPLANT
COOLER POLAR GLACIER W/PUMP (MISCELLANEOUS) ×3 IMPLANT
COVER WAND RF STERILE (DRAPES) ×1 IMPLANT
CUFF TOURN SGL QUICK 24 (TOURNIQUET CUFF) ×2
CUFF TOURN SGL QUICK 30 (TOURNIQUET CUFF)
CUFF TRNQT CYL 24X4X16.5-23 (TOURNIQUET CUFF) IMPLANT
CUFF TRNQT CYL 30X4X21-28X (TOURNIQUET CUFF) IMPLANT
DRAPE C-ARM XRAY 36X54 (DRAPES) ×3 IMPLANT
DRAPE C-ARMOR (DRAPES) ×3 IMPLANT
DRAPE INCISE IOBAN 66X45 STRL (DRAPES) ×3 IMPLANT
DRAPE TABLE BACK 80X90 (DRAPES) ×3 IMPLANT
DRILL SHORT 3.2MM (DRILL) ×3
DRSG AQUACEL AG ADV 3.5X10 (GAUZE/BANDAGES/DRESSINGS) ×3 IMPLANT
ELECT REM PT RETURN 9FT ADLT (ELECTROSURGICAL) ×3
ELECTRODE REM PT RTRN 9FT ADLT (ELECTROSURGICAL) ×1 IMPLANT
GAUZE PETRO XEROFOAM 1X8 (MISCELLANEOUS) ×5 IMPLANT
GAUZE SPONGE 4X4 12PLY STRL (GAUZE/BANDAGES/DRESSINGS) ×3 IMPLANT
GLOVE INDICATOR 8.0 STRL GRN (GLOVE) ×9 IMPLANT
GLOVE SURG ORTHO 8.0 STRL STRW (GLOVE) ×7 IMPLANT
GOWN STRL REUS W/ TWL LRG LVL3 (GOWN DISPOSABLE) ×1 IMPLANT
GOWN STRL REUS W/ TWL XL LVL3 (GOWN DISPOSABLE) ×1 IMPLANT
GOWN STRL REUS W/TWL LRG LVL3 (GOWN DISPOSABLE) ×2
GOWN STRL REUS W/TWL XL LVL3 (GOWN DISPOSABLE) ×6
GUIDEWIRE 3.2X400 (WIRE) ×2 IMPLANT
INACTIVE NO USAGE (Screw) ×8 IMPLANT
KIT TURNOVER KIT A (KITS) ×3 IMPLANT
MAT ABSORB  FLUID 56X50 GRAY (MISCELLANEOUS) ×2
MAT ABSORB FLUID 56X50 GRAY (MISCELLANEOUS) ×1 IMPLANT
NAIL TIB 8X375 (Nail) ×2 IMPLANT
NDL HPO THNWL 1X22GA REG BVL (NEEDLE) IMPLANT
NEEDLE SAFETY 22GX1 (NEEDLE) ×2
NS IRRIG 1000ML POUR BTL (IV SOLUTION) ×3 IMPLANT
PACK EXTREMITY ARMC (MISCELLANEOUS) ×3 IMPLANT
PAD CAST CTTN 4X4 STRL (SOFTGOODS) ×1 IMPLANT
PAD PREP 24X41 OB/GYN DISP (PERSONAL CARE ITEMS) ×3 IMPLANT
PAD WRAPON POLAR KNEE (MISCELLANEOUS) ×1 IMPLANT
PADDING CAST COTTON 4X4 STRL (SOFTGOODS) ×2
REAMER ROD DEEP FLUTE 2.5X950 (INSTRUMENTS) ×2 IMPLANT
SPONGE LAP 18X18 RF (DISPOSABLE) ×3 IMPLANT
STAPLER SKIN PROX 35W (STAPLE) ×3 IMPLANT
STOCKINETTE BIAS CUT 6 980064 (GAUZE/BANDAGES/DRESSINGS) ×3 IMPLANT
STOCKINETTE IMPERVIOUS 9X36 MD (GAUZE/BANDAGES/DRESSINGS) ×3 IMPLANT
SUT VIC AB 0 CT1 36 (SUTURE) ×3 IMPLANT
SUT VIC AB 2-0 CT1 27 (SUTURE) ×8
SUT VIC AB 2-0 CT1 TAPERPNT 27 (SUTURE) ×1 IMPLANT
SUT VICRYL+ 3-0 36IN CT-1 (SUTURE) ×3 IMPLANT
SYR 20CC LL (SYRINGE) ×2 IMPLANT
WRAPON POLAR PAD KNEE (MISCELLANEOUS) ×3

## 2019-02-11 NOTE — Progress Notes (Signed)
Initial Nutrition Assessment  RD working remotely.  DOCUMENTATION CODES:   Not applicable  INTERVENTION:  Provide Ensure Enlive po BID, each supplement provides 350 kcal and 20 grams of protein. Encourage patient to continue drinking a high-calorie, high-protein ONS at home following discharge.  NUTRITION DIAGNOSIS:   Increased nutrient needs related to catabolic illness(COPD, left kidney cancer with mets) as evidenced by estimated needs.  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   -Patient has been on immunotherapy most recently but is s/p multiple lines of therapy per oncology note.  Patient currently in OR for left tibial IM nail. He was previously on a heart healthy/carbohydrate modified diet and ate 75% of his lunch yesterday. He has previously been screened by outpatient RD at cancer center in 2018 and at that time still had good appetite and intake. Weights are trending down in chart. He was 85.2 kg on 1/16. Currently 81.6 kg. He has lost 3.6 kg (4.2% body weight) over approximately 2 months, which is not significant for time frame. Will be important to support patient with oral nutrition supplements to ensure he does not begin to lose a significant amount of weight or lose muscle mass.  Medications reviewed and include: vitamin D3 400 units daily, Colace 100 mg BID, fentanyl patch, melatonin QHS, Lovaza 2 grams daily, Flomax, LR @ 50 mL/hr.  Labs reviewed.  Unable to complete nutrition-focused physical exam at this time.  Diet Order:   Diet Order            Diet NPO time specified  Diet effective midnight              EDUCATION NEEDS:   Not appropriate for education at this time  Skin:  Skin Assessment: Reviewed RN Assessment(ecchymosis)  Last BM:  02/10/2019 per chart  Height:   Ht Readings from Last 1 Encounters:  02/10/19 6\' 1"  (1.854 m)   Weight:   Wt Readings from Last 1 Encounters:  02/10/19 81.6 kg   Ideal Body Weight:  83.6 kg  BMI:  Body mass index is 23.75 kg/m.  Estimated Nutritional Needs:   Kcal:  2080-2400 (MSJ x 1.3-1.5)  Protein:  100-120 grams (1.2-1.5 grams/kg)  Fluid:  2-2.4 L/day (1 mL/kcal)  Willey Blade, MS, RD, LDN Office: [phone] Pager: [phone] After Hours/Weekend Pager: [phone]

## 2019-02-11 NOTE — Progress Notes (Signed)
[place] [facility] [place] NAME: Glen Davis    MR#:  [id number]  DATE OF BIRTH:  [birth year]  SUBJECTIVE:   Patient admitted to the hospital secondary to mechanical fall and noted to have a left tib-fib fracture.  Plan for surgery today.  Patient seen preoperatively.  he had some low-grade fever this morning but no cough shortness of breath or any other associated symptoms.  REVIEW OF SYSTEMS:    Review of Systems  Constitutional: Positive for fever. Negative for chills.  HENT: Negative for congestion and tinnitus.   Eyes: Negative for blurred vision and double vision.  Respiratory: Negative for cough, shortness of breath and wheezing.   Cardiovascular: Negative for chest pain, orthopnea and PND.  Gastrointestinal: Negative for abdominal pain, diarrhea, nausea and vomiting.  Genitourinary: Negative for dysuria and hematuria.  Neurological: Negative for dizziness, sensory change and focal weakness.  All other systems reviewed and are negative.   Nutrition: NPO for surgery Tolerating Diet: No Tolerating PT: Await Eval post-operatively.   DRUG ALLERGIES:   Allergies  Allergen Reactions   Lisinopril Swelling   Other Itching    Nitroglycerin Patch.    VITALS:  Blood pressure (!) 143/76, pulse 67, temperature 98.3 F (36.8 C), resp. rate (!) 9, height 6\' 1"  (1.854 m), weight 81.6 kg, SpO2 91 %.  PHYSICAL EXAMINATION:   Physical Exam  GENERAL:  78 y.o.-year-old patient lying in bed in no acute distress.  EYES: Pupils equal, round, reactive to light and accommodation. No scleral icterus. Extraocular muscles intact.  HEENT: Head atraumatic, normocephalic. Oropharynx and nasopharynx clear.  NECK:  Supple, no jugular venous distention. No thyroid enlargement, no tenderness.  LUNGS: Normal breath sounds bilaterally, no wheezing, rales, rhonchi. No use of accessory muscles of respiration.  CARDIOVASCULAR: S1, S2 normal. No murmurs, rubs,  or gallops.  ABDOMEN: Soft, nontender, nondistended. Bowel sounds present. No organomegaly or mass.  EXTREMITIES: No cyanosis, clubbing or edema b/l.   Left leg in immobilizer.  NEUROLOGIC: Cranial nerves II through XII are intact. No focal Motor or sensory deficits b/l.   PSYCHIATRIC: The patient is alert and oriented x 3.  SKIN: No obvious rash, lesion, or ulcer.    LABORATORY PANEL:   CBC Recent Labs  Lab 02/10/19 1043  WBC 5.3  HGB 12.9*  HCT 39.2  PLT 143*   ------------------------------------------------------------------------------------------------------------------  Chemistries  Recent Labs  Lab 02/10/19 1043  NA 139  K 3.8  CL 105  CO2 27  GLUCOSE 153*  BUN 12  CREATININE 1.02  CALCIUM 9.2  AST 19  ALT 16  ALKPHOS 71  BILITOT 0.5   ------------------------------------------------------------------------------------------------------------------  Cardiac Enzymes No results for input(s): TROPONINI in the last 168 hours. ------------------------------------------------------------------------------------------------------------------  RADIOLOGY:  Dg Tibia/fibula Left  Result Date: 02/11/2019 CLINICAL DATA:  Tibial and fibular fractures EXAM: LEFT TIBIA AND FIBULA - 2 VIEW; DG C-ARM 61-120 MIN COMPARISON:  None. FLUOROSCOPY TIME:  Fluoroscopy Time:  2 minutes 1 second Radiation Exposure Index (if provided by the fluoroscopic device): 2.56 mGy Number of Acquired Spot Images: 17 FINDINGS: Initial images demonstrate a stylet extending into the proximal aspect of the tibia. This subsequently extends distally and medullary rod is placed. Proximal and distal fixation screws are seen. Fracture fragments are in near anatomic alignment. IMPRESSION: ORIF of left tibial fracture Electronically Signed   By: Inez Catalina M.D.   On: 02/11/2019 13:01   Dg Tibia/fibula Left  Result Date: 02/10/2019 CLINICAL DATA:  Pain following fall EXAM: LEFT TIBIA AND FIBULA - 2 VIEW  COMPARISON:  September 06, 2017 FINDINGS: Frontal and lateral views were obtained. There is a comminuted fracture at the junction of the proximal and mid thirds of the left tibia. There is mild lateral angulation and posterior displacement of the distal major fracture fragment with respect proximal fragment. There is a comminuted fracture of the proximal fibular diaphysis with mild posterior displacement distally. No other fractures are evident. No dislocations. There is extensive osteoarthritic change medially in the knee joint region. There is a focal lucent area in the distal tibial diaphysis measuring 1.8 x 1.5 cm. No similar changes elsewhere. IMPRESSION: 1. Comminuted fractures of the proximal tibia and fibula as noted. No dislocation. 2. 1.8 x 1.5 cm lucent area in the distal tibia. Etiology for this lesion uncertain. It was not appreciable on prior study from 2018. A neoplastic focus must be of concern given this change since the 2018 study. Nonemergent MR of the distal tibia region is probably the optimum study of choice to further evaluate this area from an imaging standpoint. 3.  Extensive knee joint arthropathy, particularly medially. Electronically Signed   By: Lowella Grip III M.D.   On: 02/10/2019 10:30   Dg C-arm 1-60 Min  Result Date: 02/11/2019 CLINICAL DATA:  Tibial and fibular fractures EXAM: LEFT TIBIA AND FIBULA - 2 VIEW; DG C-ARM 61-120 MIN COMPARISON:  None. FLUOROSCOPY TIME:  Fluoroscopy Time:  2 minutes 1 second Radiation Exposure Index (if provided by the fluoroscopic device): 2.56 mGy Number of Acquired Spot Images: 17 FINDINGS: Initial images demonstrate a stylet extending into the proximal aspect of the tibia. This subsequently extends distally and medullary rod is placed. Proximal and distal fixation screws are seen. Fracture fragments are in near anatomic alignment. IMPRESSION: ORIF of left tibial fracture Electronically Signed   By: Inez Catalina M.D.   On: 02/11/2019 13:01      ASSESSMENT AND PLAN:   78 year old male with past medical history of coronary disease, COPD, hypertension, lung cancer, history of melanoma who presents to the hospital after a mechanical fall noted to have a left tibia and fibular fracture.  1.  Acute left tibia and fibular fracture- this was secondary to a mechanical fall. -Seen by orthopedics and plan for surgery later today.  Continue pain control and further care as per surgery.  2.  History of chronic pain-secondary to underlying malignancy. -Continue fentanyl patch, Dilaudid for pain control for now.  3.  COPD-no acute exacerbation. Continue duo nebs as needed.  4.  Anxiety/depression-continue Buspar/Cymbalta.  5. Hyperlipidemia - cont. Atorvastatin  6. BPH - cont. Flomax.     All the records are reviewed and case discussed with Care Management/Social Worker. Management plans discussed with the patient, family and they are in agreement.  CODE STATUS: Full code  DVT Prophylaxis: Lovenox  TOTAL TIME TAKING CARE OF THIS PATIENT: 30 minutes.   POSSIBLE D/C IN 2-3 DAYS, DEPENDING ON CLINICAL CONDITION.   Henreitta Leber M.D on 02/11/2019 at 1:33 PM  Between 7am to 6pm - Pager - [phone]  After 6pm go to [email or web address] - Proofreader  [facility]  Office  [phone]  CC: Primary care physician; Dion Body, MD

## 2019-02-11 NOTE — Transfer of Care (Signed)
Immediate Anesthesia Transfer of Care Note  Patient: Glen Davis  Procedure(s) Performed: INTRAMEDULLARY (IM) NAIL TIBIAL (Left Knee)  Patient Location: PACU  Anesthesia Type:General  Level of Consciousness: drowsy and patient cooperative  Airway & Oxygen Therapy: Patient Spontanous Breathing and Patient connected to face mask oxygen  Post-op Assessment: Report given to RN and Post -op Vital signs reviewed and stable  Post vital signs: Reviewed and stable  Last Vitals:  Vitals Value Taken Time  BP 125/66 02/11/2019 12:59 PM  Temp    Pulse 62 02/11/2019 12:59 PM  Resp 11 02/11/2019 12:59 PM  SpO2 98 % 02/11/2019 12:59 PM  Vitals shown include unvalidated device data.  Last Pain:  Vitals:   02/11/19 0912  TempSrc:   PainSc: 0-No pain         Complications: No apparent anesthesia complications

## 2019-02-11 NOTE — Op Note (Signed)
Expand All Collapse All     02/11/2019  12:49 PM  PATIENT:  Glen Davis   MRN: [id number]  PRE-OPERATIVE DIAGNOSIS: Left tibia and fibula fracture  POST-OPERATIVE DIAGNOSIS:  Left tibia and fibula fracture  PROCEDURE:  Procedure(s): INTRAMEDULLARY (IM) NAIL TIBIAL, LEFT  SURGEON: Elyn Aquas. Harlow Mares, MD  ASSIST: Carlynn Spry, PA-C  ANESTHESIA: General and local  PREOPERATIVE INDICATIONS: The patient  has a diagnosis of displaced and unstable tibia/ fibula fractures who elected for surgical management after discussion with the patient   about the options between surgery and cast management of the fractures.   The risks benefits and alternatives were discussed with the patient preoperatively including but not limited to the risks of infection, bleeding, nerve injury, cardiopulmonary complications, the need for revision surgery, among others, and the patient was willing to proceed.  OPERATIVE IMPLANTS: Synthes EX,   375 mm    8 mm  OPERATIVE FINDINGS: comminuted, displaced fractures of the tibia and fibula  COMPLICATIONS: None  EBL: 200 mL REPLACED: None  OPERATIVE PROCEDURE: The patient was brought to the operating room and underwent spinal anesthesia without complications and then placed on the operating room table and positioned appropriately. The operative leg was prepped and draped in a sterile fashion. Tourniquet was not used. IV anti-biotics were given. The leg was placed on the tibial reduction triangle and the foot immobilized with Coban and traction and rotational corrections were made. A proximal medial incision was made along the patellar tendon. Dissection was carried out bluntly through subcutaneous tissue and the fascia was divided. A guidepin was introduced into the proximal tibia under fluoroscopic control and seen to be in good alignment and position. Large drill was introduced to open the the proximal tibia. A guidepin was then passed down the shaft  of the tibia and across the fracture site down to the lower end of the tibia. The shaft was then sequentially reamed to  9 mm. The above listed Synthes nail was introduced and passed across the fracture site and fully seated in the tibia. Fluoroscopy showed excellent position of the nail and that the fracture had been well reduced. Length was excellent. Proximally, fixation was obtained with  two  4.0 screw(s).  Fluoroscopy showed good position and length. Distally,  two medial/ lateral screws were introduced and seated fully.Fluoroscopy showed good position and length. The wounds were then irrigated. The knee fascia was closed with 0 Vicryls and the subcutaneous tissue was closed with 2-0 Vicryls. All wounds were closed with staples. Xeroform covered by dressing sponges and asterile dressing was applied.Sponge and needle counts were correct. The patient was transferred to the hospital bed and taken to recovery room in good condition.  Elyn Aquas. Harlow Mares, MD

## 2019-02-11 NOTE — Anesthesia Procedure Notes (Signed)
Procedure Name: Intubation Date/Time: 02/11/2019 10:41 AM Performed by: Jonna Clark, CRNA Pre-anesthesia Checklist: Patient identified, Patient being monitored, Timeout performed, Emergency Drugs available and Suction available Patient Re-evaluated:Patient Re-evaluated prior to induction Oxygen Delivery Method: Circle system utilized Preoxygenation: Pre-oxygenation with 100% oxygen Induction Type: IV induction Ventilation: Mask ventilation without difficulty Laryngoscope Size: Mac and 3 Grade View: Grade I Tube type: Oral Tube size: 7.5 mm Number of attempts: 1 Placement Confirmation: ETT inserted through vocal cords under direct vision,  positive ETCO2 and breath sounds checked- equal and bilateral Secured at: 21 cm Tube secured with: Tape Dental Injury: Teeth and Oropharynx as per pre-operative assessment

## 2019-02-11 NOTE — Anesthesia Post-op Follow-up Note (Signed)
Anesthesia QCDR form completed.        

## 2019-02-11 NOTE — H&P (Signed)
The patient has been re-examined, and the chart reviewed, and there have been no interval changes to the documented history and physical.  Plan a left tibia intramedullary nail today.  Anesthesia is consulted regarding a peripheral nerve block for post-operative pain.  The risks, benefits, and alternatives have been discussed at length, and the patient is willing to proceed.

## 2019-02-11 NOTE — Assessment & Plan Note (Signed)
#  78 year old male patient with left kidney cancer metastatic to lung/bone most recently on immunotherapy is currently admitted to hospital for left femoral/tibial fracture  # Left kidney cancer metastatic to the lung-  Jan 9th CT- progression of lung/ bone lesions; status post multiple lines of therapy currently on Ipi [1]+ nivo [3] q 3 W-status post 2 cycles last cycle approximately 1 week ago.  Too early to assess response.  Again reviewed the overall poor prognosis with the patient.  If the patient does respond to treatment ~the survival been the order of "many months"; however if he does not respond to treatment survival in the order of "few months".  #Left lower extremity tibia/fibular fracture-likely pathologic precipitated by mechanical fall.  Awaiting orthopedic surgery evaluation.  #Left shoulder pain/pain from recent fracture-continue pain management.  #DVT prophylaxis as per protocol post surgery.  Lovenox 40 mg subcu daily.   Thank you Dr.Salary for allowing me to participate in the care of your pleasant patient. Please do not hesitate to contact me with questions or concerns in the interim.

## 2019-02-11 NOTE — Anesthesia Preprocedure Evaluation (Signed)
Anesthesia Evaluation  Patient identified by MRN, date of birth, ID band Patient awake    Reviewed: Allergy & Precautions, NPO status , Patient's Chart, lab work & pertinent test results  History of Anesthesia Complications Negative for: history of anesthetic complications  Airway Mallampati: II       Dental  (+) Upper Dentures   Pulmonary COPD,  COPD inhaler, former smoker,           Cardiovascular hypertension, Pt. on medications + CAD and + Cardiac Stents       Neuro/Psych neg Seizures Anxiety    GI/Hepatic Neg liver ROS, neg GERD  ,  Endo/Other  neg diabetes  Renal/GU Renal disease (Renal CA, sp nephrectomy)     Musculoskeletal   Abdominal   Peds  Hematology   Anesthesia Other Findings   Reproductive/Obstetrics                             Anesthesia Physical Anesthesia Plan  ASA: III  Anesthesia Plan: Spinal   Post-op Pain Management:    Induction:   PONV Risk Score and Plan:   Airway Management Planned: Nasal Cannula  Additional Equipment:   Intra-op Plan:   Post-operative Plan:   Informed Consent: I have reviewed the patients History and Physical, chart, labs and discussed the procedure including the risks, benefits and alternatives for the proposed anesthesia with the patient or authorized representative who has indicated his/her understanding and acceptance.       Plan Discussed with:   Anesthesia Plan Comments:         Anesthesia Quick Evaluation

## 2019-02-11 NOTE — Consult Note (Signed)
[facility] CONSULT NOTE  Patient Care Team: Dion Body, MD as PCP - General (Family Medicine) Jannet Mantis, MD (Dermatology) Byrnett, Forest Gleason, MD (General Surgery) Hollice Espy, MD as Consulting Physician (Urology) Cammie Sickle, MD as Medical Oncologist (Medical Oncology) Isaias Cowman, MD as Consulting Physician (Cardiology) Harriette Bouillon, MD as Referring Physician (Cardiology)  CHIEF COMPLAINTS/PURPOSE OF CONSULTATION:  Kidney cancer  HISTORY OF PRESENTING ILLNESS: In general patient vague historian Glen Davis 78 y.o.  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  Patient is currently admitted to hospital for left tibial/femoral fracture.  Patient has multiple lytic bone lesions from his underlying metastatic kidney cancer.  He had radiation to the left tibia/femoral area approximately 2 months ago.  Patient is also in [place].  Patient states that he was walking his dog when he mechanically tripped and fell and fractured his left lower extremity.  Patient at this time denies any unusual shortness of breath or cough.  Denies any diarrhea.  Denies skin rash.  Review of Systems  Constitutional: Negative for chills, diaphoresis, fever, malaise/fatigue and weight loss.  HENT: Negative for nosebleeds and sore throat.   Eyes: Negative for double vision.  Respiratory: Negative for cough, hemoptysis, sputum production, shortness of breath and wheezing.   Cardiovascular: Negative for chest pain, palpitations, orthopnea and leg swelling.  Gastrointestinal: Negative for abdominal pain, blood in stool, constipation, diarrhea, heartburn, melena, nausea and vomiting.  Genitourinary: Negative for dysuria, frequency and urgency.  Musculoskeletal: Negative for back pain and joint pain.       Pain  at the site of his left lower extremity fracture; also left shoulder pain.  Skin: Negative.  Negative for itching and rash.  Neurological: Negative for dizziness, tingling, focal weakness, weakness and headaches.  Endo/Heme/Allergies: Does not bruise/bleed easily.  Psychiatric/Behavioral: Negative for depression. The patient is not nervous/anxious and does not have insomnia.      MEDICAL HISTORY:  Past Medical History:  Diagnosis Date  . CAD (coronary artery disease)   . Cancer ([place])    left arm  . COPD (chronic obstructive pulmonary disease) ([place])   . Hypertension   . Kidney stone   . Lung cancer ([place])   . Melanoma ([place]) 01/24/2016   right shoulder  . Thyroid nodule 02/02/2016   left BENIGN THYROID NODULE by FNA    SURGICAL HISTORY: Past Surgical History:  Procedure Laterality Date  . arm surgery Left    arm  . cardiac stents  2011   Angioplasty / Stenting Femoral-X2  . COLONOSCOPY  04/01/12  . CORONARY ANGIOPLASTY    . EXCISION MELANOMA WITH SENTINEL LYMPH NODE BIOPSY Right 01/24/2016   Procedure: EXCISION MELANOMA Right Shoulder;  Surgeon: Robert Bellow, MD;  Location: [facility] ORS;  Service: General;  Laterality: Right;  . LAPAROSCOPIC NEPHRECTOMY, HAND ASSISTED Left 04/24/2016   Procedure: HAND ASSISTED LAPAROSCOPIC NEPHRECTOMY;  Surgeon: Hollice Espy, MD;  Location: [facility] ORS;  Service: Urology;  Laterality: Left;  Marland Kitchen VIDEO ASSISTED THORACOSCOPY (VATS)/THOROCOTOMY Left 03/08/2016   Procedure: VIDEO ASSISTED THORACOSCOPY (VATS) with lung biopsy - Left ;  Surgeon: Robert Bellow, MD;  Location: [facility] ORS;  Service: General;  Laterality: Left;    SOCIAL HISTORY: Social History   Socioeconomic History  . Marital status: Married    Spouse name: Not on file  . Number of children:  Not on file  . Years of education: Not on file  . Highest education level: Not on file  Occupational History  . Not on file  Social Needs  . Financial resource strain: Not on file  . Food  insecurity:    Worry: Not on file    Inability: Not on file  . Transportation needs:    Medical: Not on file    Non-medical: Not on file  Tobacco Use  . Smoking status: Former Smoker    Packs/day: 2.00    Years: 30.00    Pack years: 60.00    Types: Cigarettes    Start date: 01/18/1956    Last attempt to quit: 01/17/1969    Years since quitting: 50.1  . Smokeless tobacco: Never Used  Substance and Sexual Activity  . Alcohol use: Not Currently    Alcohol/week: 0.0 standard drinks    Comment: BEER OCC  . Drug use: No  . Sexual activity: Yes  Lifestyle  . Physical activity:    Days per week: Not on file    Minutes per session: Not on file  . Stress: Not on file  Relationships  . Social connections:    Talks on phone: Not on file    Gets together: Not on file    Attends religious service: Not on file    Active member of club or organization: Not on file    Attends meetings of clubs or organizations: Not on file    Relationship status: Not on file  . Intimate partner violence:    Fear of current or ex partner: Not on file    Emotionally abused: Not on file    Physically abused: Not on file    Forced sexual activity: Not on file  Other Topics Concern  . Not on file  Social History Narrative  . Not on file    FAMILY HISTORY: Family History  Problem Relation Age of Onset  . Hodgkin's lymphoma Daughter 5  . Heart attack Father   . Kidney cancer Neg Hx   . Kidney disease Neg Hx   . Prostate cancer Neg Hx     ALLERGIES:  is allergic to lisinopril and other.  MEDICATIONS:  Current Facility-Administered Medications  Medication Dose Route Frequency Provider Last Rate Last Dose  . 0.9 %  sodium chloride infusion  250 mL Intravenous PRN Salary, Montell D, MD      . acetaminophen (TYLENOL) tablet 650 mg  650 mg Oral Q6H PRN Salary, Montell D, MD       Or  . acetaminophen (TYLENOL) suppository 650 mg  650 mg Rectal Q6H PRN Salary, Montell D, MD      . atorvastatin (LIPITOR)  tablet 40 mg  40 mg Oral q1800 Salary, Montell D, MD   40 mg at 02/10/19 1746  . bisacodyl (DULCOLAX) EC tablet 5 mg  5 mg Oral Daily PRN Salary, Montell D, MD      . busPIRone (BUSPAR) tablet 5 mg  5 mg Oral BID Salary, Montell D, MD   5 mg at 02/10/19 2146  . ceFAZolin (ANCEF) IVPB 2g/100 mL premix  2 g Intravenous 60 min Pre-Op Lovell Sheehan, MD      . chlorpheniramine-HYDROcodone (TUSSIONEX) 10-8 MG/5ML suspension 5 mL  5 mL Oral Q12H PRN Salary, Montell D, MD      . cholecalciferol (VITAMIN D3) tablet 400 Units  400 Units Oral Daily Salary, Montell D, MD      . docusate sodium (COLACE)  capsule 100 mg  100 mg Oral BID Loney Hering D, MD   100 mg at 02/10/19 2146  . DULoxetine (CYMBALTA) DR capsule 60 mg  60 mg Oral Daily Salary, Montell D, MD      . fentaNYL (DURAGESIC) 50 MCG/HR 1 patch  1 patch Transdermal Q72H Salary, Montell D, MD   1 patch at 02/10/19 1448  . fluticasone (FLONASE) 50 MCG/ACT nasal spray 2 spray  2 spray Each Nare Daily PRN Salary, Montell D, MD      . HYDROmorphone (DILAUDID) injection 2 mg  2 mg Intravenous Q2H PRN Salary, Montell D, MD   2 mg at 02/10/19 2146  . ipratropium-albuterol (DUONEB) 0.5-2.5 (3) MG/3ML nebulizer solution 3 mL  3 mL Nebulization Q4H PRN Salary, Montell D, MD      . loperamide (IMODIUM) capsule 4 mg  4 mg Oral PRN Salary, Montell D, MD      . loratadine (CLARITIN) tablet 10 mg  10 mg Oral Daily PRN Salary, Montell D, MD      . Melatonin TABS 10 mg  10 mg Oral QHS Salary, Montell D, MD   10 mg at 02/10/19 2146  . montelukast (SINGULAIR) tablet 10 mg  10 mg Oral QHS Salary, Holly Bodily D, MD   10 mg at 02/10/19 2146  . nitroGLYCERIN (NITROSTAT) SL tablet 0.4 mg  0.4 mg Sublingual Q5 min PRN Salary, Montell D, MD      . omega-3 acid ethyl esters (LOVAZA) capsule 2 g  2 g Oral Daily Salary, Montell D, MD      . ondansetron (ZOFRAN) tablet 8 mg  8 mg Oral Q8H PRN Salary, Montell D, MD      . polyethylene glycol (MIRALAX / GLYCOLAX) packet 17 g  17 g  Oral Daily PRN Salary, Montell D, MD      . promethazine (PHENERGAN) tablet 12.5 mg  12.5 mg Oral Q6H PRN Salary, Montell D, MD      . sodium chloride flush (NS) 0.9 % injection 3 mL  3 mL Intravenous Q12H Salary, Montell D, MD   3 mL at 02/10/19 1308  . sodium chloride flush (NS) 0.9 % injection 3 mL  3 mL Intravenous PRN Salary, Montell D, MD      . sodium phosphate (FLEET) 7-19 GM/118ML enema 1 enema  1 enema Rectal Once PRN Salary, Montell D, MD      . tamsulosin (FLOMAX) capsule 0.4 mg  0.4 mg Oral BH-q7a Salary, Montell D, MD      . temazepam (RESTORIL) capsule 7.5 mg  7.5 mg Oral QHS PRN Salary, Montell D, MD      . tiZANidine (ZANAFLEX) tablet 4 mg  4 mg Oral Daily PRN Salary, Avel Peace, MD       Facility-Administered Medications Ordered in Other Encounters  Medication Dose Route Frequency Provider Last Rate Last Dose  . 0.9 %  sodium chloride infusion   Intravenous Continuous Cammie Sickle, MD 10 mL/hr at 10/27/18 1054        .  PHYSICAL EXAMINATION:  Vitals:   02/10/19 1609 02/10/19 2335  BP: 108/68 97/65  Pulse: 62 65  Resp: 12 17  Temp: 97.6 F (36.4 C) (!) 97.5 F (36.4 C)  SpO2: 96% 100%   Filed Weights   02/10/19 0949  Weight: 180 lb (81.6 kg)    Physical Exam  Constitutional: He is oriented to person, place, and time and well-developed, well-nourished, and in no distress.  HENT:  Head: Normocephalic  and atraumatic.  Mouth/Throat: Oropharynx is clear and moist. No oropharyngeal exudate.  Eyes: Pupils are equal, round, and reactive to light.  Neck: Normal range of motion. Neck supple.  Cardiovascular: Normal rate and regular rhythm.  Pulmonary/Chest: Effort normal and breath sounds normal. No respiratory distress. He has no wheezes.  Abdominal: Soft. Bowel sounds are normal. He exhibits no distension and no mass. There is no abdominal tenderness. There is no rebound and no guarding.  Musculoskeletal: Normal range of motion.        General: No  tenderness or edema.     Comments: Left lower extremity bandaged/splint  Neurological: He is alert and oriented to person, place, and time.  Skin: Skin is warm.  Psychiatric: Affect normal.     LABORATORY DATA:  I have reviewed the data as listed Lab Results  Component Value Date   WBC 5.3 02/10/2019   HGB 12.9 (L) 02/10/2019   HCT 39.2 02/10/2019   MCV 92.0 02/10/2019   PLT 143 (L) 02/10/2019   Recent Labs    01/08/19 1007 01/29/19 0800 02/10/19 1043  NA 138 138 139  K 3.8 4.1 3.8  CL 103 105 105  CO2 28 26 27   GLUCOSE 104* 125* 153*  BUN 12 15 12   CREATININE 1.04 1.22 1.02  CALCIUM 9.0 9.0 9.2  GFRNONAA >60 57* >60  GFRAA >60 >60 >60  PROT 6.8 7.0 6.8  ALBUMIN 3.8 3.9 3.9  AST 15 17 19   ALT 11 13 16   ALKPHOS 69 65 71  BILITOT 0.7 0.6 0.5    RADIOGRAPHIC STUDIES: I have personally reviewed the radiological images as listed and agreed with the findings in the report. Dg Chest 2 View  Result Date: 01/15/2019 CLINICAL DATA:  Cough for 2 weeks. History of metastatic lung cancer. EXAM: CHEST - 2 VIEW COMPARISON:  11/28/2018 CT and prior studies. FINDINGS: The cardiomediastinal silhouette is unremarkable. Nodular pulmonary opacities are unchanged. No airspace disease, pleural effusion or pneumothorax. A LEFT glenoid fracture is again noted. IMPRESSION: No evidence of acute abnormality. Nodular pulmonary opacities/metastases again noted. Electronically Signed   By: Margarette Canada M.D.   On: 01/15/2019 12:25   Dg Tibia/fibula Left  Result Date: 02/10/2019 CLINICAL DATA:  Pain following fall EXAM: LEFT TIBIA AND FIBULA - 2 VIEW COMPARISON:  September 06, 2017 FINDINGS: Frontal and lateral views were obtained. There is a comminuted fracture at the junction of the proximal and mid thirds of the left tibia. There is mild lateral angulation and posterior displacement of the distal major fracture fragment with respect proximal fragment. There is a comminuted fracture of the proximal  fibular diaphysis with mild posterior displacement distally. No other fractures are evident. No dislocations. There is extensive osteoarthritic change medially in the knee joint region. There is a focal lucent area in the distal tibial diaphysis measuring 1.8 x 1.5 cm. No similar changes elsewhere. IMPRESSION: 1. Comminuted fractures of the proximal tibia and fibula as noted. No dislocation. 2. 1.8 x 1.5 cm lucent area in the distal tibia. Etiology for this lesion uncertain. It was not appreciable on prior study from 2018. A neoplastic focus must be of concern given this change since the 2018 study. Nonemergent MR of the distal tibia region is probably the optimum study of choice to further evaluate this area from an imaging standpoint. 3.  Extensive knee joint arthropathy, particularly medially. Electronically Signed   By: Lowella Grip III M.D.   On: 02/10/2019 10:30    Cancer of  left kidney excluding renal pelvis [facility])  #78 year old male patient with left kidney cancer metastatic to lung/bone most recently on immunotherapy is currently admitted to hospital for left femoral/tibial fracture  # Left kidney cancer metastatic to the lung-  Jan 9th CT- progression of lung/ bone lesions; status post multiple lines of therapy currently on Ipi [1]+ nivo [3] q 3 W-status post 2 cycles last cycle approximately 1 week ago.  Too early to assess response.  Again reviewed the overall poor prognosis with the patient.  If the patient does respond to treatment ~the survival been the order of "many months"; however if he does not respond to treatment survival in the order of "few months".  #Left lower extremity tibia/fibular fracture-likely pathologic precipitated by mechanical fall.  Awaiting orthopedic surgery evaluation.  #Left shoulder pain/pain from recent fracture-continue pain management.  #DVT prophylaxis as per protocol post surgery.  Lovenox 40 mg subcu daily.   Thank you Dr.Salary for allowing me to  participate in the care of your pleasant patient. Please do not hesitate to contact me with questions or concerns in the interim.   All questions were answered. The patient knows to call the clinic with any problems, questions or concerns.    Cammie Sickle, MD 02/11/2019 8:13 AM

## 2019-02-12 LAB — BASIC METABOLIC PANEL
Anion gap: 7 (ref 5–15)
BUN: 14 mg/dL (ref 8–23)
CO2: 28 mmol/L (ref 22–32)
Calcium: 8.7 mg/dL — ABNORMAL LOW (ref 8.9–10.3)
Chloride: 104 mmol/L (ref 98–111)
Creatinine, Ser: 1.17 mg/dL (ref 0.61–1.24)
GFR, EST NON AFRICAN AMERICAN: 60 mL/min — AB (ref >60)
Glucose, Bld: 133 mg/dL — ABNORMAL HIGH (ref 70–99)
Potassium: 4.3 mmol/L (ref 3.5–5.1)
Sodium: 139 mmol/L (ref 135–145)

## 2019-02-12 LAB — CBC
HCT: 31.3 % — ABNORMAL LOW (ref 39.0–52.0)
Hemoglobin: 10.3 g/dL — ABNORMAL LOW (ref 13.0–17.0)
MCH: 30.3 pg (ref 26.0–34.0)
MCHC: 32.9 g/dL (ref 30.0–36.0)
MCV: 92.1 fL (ref 80.0–100.0)
Platelets: 140 10*3/uL — ABNORMAL LOW (ref 150–400)
RBC: 3.4 MIL/uL — ABNORMAL LOW (ref 4.22–5.81)
RDW: 16.4 % — ABNORMAL HIGH (ref 11.5–15.5)
WBC: 7.6 10*3/uL (ref 4.0–10.5)
nRBC: 0 % (ref 0.0–0.2)

## 2019-02-12 MED ORDER — OXYCODONE HCL 5 MG PO TABS
5.0000 mg | ORAL_TABLET | ORAL | Status: DC | PRN
  Administered 2019-02-12: 5 mg via ORAL
  Filled 2019-02-12: qty 1

## 2019-02-12 MED ORDER — OXYCODONE HCL 5 MG PO TABS: 5.0000 mg | ORAL_TABLET | ORAL | 0 refills | Status: DC | PRN

## 2019-02-12 NOTE — TOC Transition Note (Signed)
Transition of Care [facility]) - CM/SW Discharge Note   Patient Details  Name: Glen Davis MRN: [id number] Date of Birth: [birth year]  Transition of Care [facility]) CM/SW Contact:  Su Hilt, RN Phone Number: 02/12/2019, 10:04 AM   Clinical Narrative:     To DC home today with [facility] home health PT, getting RW and 3 in 1 with Brad from [facility] transportation with wife   Final next level of care: [place] Barriers to Discharge: Barriers Resolved   Patient Goals and CMS Choice Patient states their goals for this hospitalization and ongoing recovery are:: go home CMS Medicare.gov Compare Post Acute Care list provided to:: Patient    Discharge Placement                       Discharge Plan and Services   Discharge Planning Services: CM Consult Post Acute Care Choice: Home Health          DME Arranged: 3-N-1, Walker rolling DME Agency: [facility] HH Arranged: PT [place] Agency: [place] ([facility])   Social Determinants of Health (SDOH) Interventions     Readmission Risk Interventions No flowsheet data found.

## 2019-02-12 NOTE — TOC Initial Note (Signed)
Transition of Care [facility]) - Initial/Assessment Note    Patient Details  Name: Glen Davis MRN: [id number] Date of Birth: 1941/05/17  Transition of Care [facility]) CM/SW Contact:    Su Hilt, RN Phone Number: 02/12/2019, 10:02 AM  Clinical Narrative:                  Met with the patient to discuss DC Plan and needs Patient chose to use [facility], notified jason at [facility] that they would like to use them Ordered a 3 in 1 and RW thru [facility] and notified Brad that will DC today Patient's PCP is Dr. Netty [facility]ring [facility] Has transportation Has family to help at home with wife and daughter   Expected Discharge Plan: [place] Barriers to Discharge: Barriers Resolved   Patient Goals and CMS Choice Patient states their goals for this hospitalization and ongoing recovery are:: go home CMS Medicare.gov Compare Post Acute Care list provided to:: Patient    Expected Discharge Plan and Services Expected Discharge Plan: [place]   Discharge Planning Services: CM Consult Post Acute Care Choice: [place] arrangements for the past 2 months: Single Family Home Expected Discharge Date: 02/13/19               DME Arranged: 3-N-1, Walker rolling DME Agency: [facility] HH Arranged: PT [place] Agency: [place] ([facility])  Prior Living Arrangements/Services Living arrangements for the past 2 months: Single Family Home Lives with:: Self Patient language and need for interpreter reviewed:: Yes Do you feel safe going back to the place where you live?: Yes      Need for Family Participation in Patient Care: No (Comment) Care giver support system in place?: Yes (comment) Current home services: DME Criminal Activity/Legal Involvement Pertinent to Current Situation/Hospitalization: No - Comment as needed  Activities of Daily Living Home Assistive Devices/Equipment: None ADL Screening (condition at time of admission) Patient's cognitive  ability adequate to safely complete daily activities?: Yes Is the patient deaf or have difficulty hearing?: No Does the patient have difficulty seeing, even when wearing glasses/contacts?: No Does the patient have difficulty concentrating, remembering, or making decisions?: No Patient able to express need for assistance with ADLs?: Yes Does the patient have difficulty dressing or bathing?: No Independently performs ADLs?: Yes (appropriate for developmental age) Does the patient have difficulty walking or climbing stairs?: Yes Weakness of Legs: Left Weakness of Arms/Hands: None  Permission Sought/Granted Permission sought to share information with : Case Manager Permission granted to share information with : Yes, Verbal Permission Granted     Permission granted to share info w AGENCY: [facility]        Emotional Assessment Appearance:: Appears stated age Attitude/Demeanor/Rapport: Engaged Affect (typically observed): Accepting Orientation: : Oriented to Self, Oriented to Place, Oriented to  Time, Oriented to Situation Alcohol / Substance Use: Never Used Psych Involvement: No (comment)  Admission diagnosis:  Tibia/fibula fracture, left, closed, initial encounter [O96.295M, S82.402A] Patient Active Problem List   Diagnosis Date Noted  . Unsp fracture of upper end of unsp tibia, init for clos fx 02/10/2019  . Cancer, metastatic to bone ([place]) 05/18/2018  . Orthostatic hypotension 03/24/2018  . Acute sinusitis 02/06/2017  . Cough 10/09/2016  . SOB (shortness of breath) 09/13/2016  . Syncope 08/17/2016  . Situational anxiety 06/05/2016  . Cancer of left kidney excluding renal pelvis ([place]) 08/02/2016  . 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 Coronary artery disease  involving native coronary artery of native heart without angina pectoris 03/16/2016  . Essential (primary) hypertension 03/16/2016  . Hypertriglyceridemia 03/16/2016  . Left thyroid nodule 02/03/2016  .  Pulmonary nodules/lesions, multiple 02/03/2016  . Counseling regarding goals of care 01/30/2016  . Vaccine counseling 01/30/2016  . 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 Melanoma of skin ([place]) 01/12/2016  . Breathlessness on exertion 04/25/2015   PCP:  Dion Body, MD Pharmacy:   [id number] [place] [place] - [address] [id number]: [phone]: [address] [address]Phone: [phone]: [phone]    Social Determinants of Health (SDOH) Interventions    Readmission Risk Interventions No flowsheet data found.

## 2019-02-12 NOTE — Progress Notes (Signed)
Subjective:  Patient reports pain as moderate.    Objective:   VITALS:   Vitals:   02/11/19 1927 02/11/19 2327 02/12/19 0421 02/12/19 0758  BP: 111/72 121/76 123/70 (!) 141/77  Pulse: 65 64 72 75  Resp: 20 19 19    Temp: 97.8 F (36.6 C) 98 F (36.7 C) 98.6 F (37 C) 98.1 F (36.7 C)  TempSrc: Oral Oral Oral Oral  SpO2: 94% 95% 100% 97%  Weight:      Height:        PHYSICAL EXAM:  Neurovascular intact Dorsiflexion/Plantar flexion intact Incision: dressing C/D/I Compartment soft  LABS  Results for orders placed or performed during the hospital encounter of 02/10/19 (from the past 24 hour(s))  CBC     Status: Abnormal   Collection Time: 02/12/19  3:01 AM  Result Value Ref Range   WBC 7.6 4.0 - 10.5 K/uL   RBC 3.40 (L) 4.22 - 5.81 MIL/uL   Hemoglobin 10.3 (L) 13.0 - 17.0 g/dL   HCT 31.3 (L) 39.0 - 52.0 %   MCV 92.1 80.0 - 100.0 fL   MCH 30.3 26.0 - 34.0 pg   MCHC 32.9 30.0 - 36.0 g/dL   RDW 16.4 (H) 11.5 - 15.5 %   Platelets 140 (L) 150 - 400 K/uL   nRBC 0.0 0.0 - 0.2 %  Basic metabolic panel     Status: Abnormal   Collection Time: 02/12/19  3:01 AM  Result Value Ref Range   Sodium 139 135 - 145 mmol/L   Potassium 4.3 3.5 - 5.1 mmol/L   Chloride 104 98 - 111 mmol/L   CO2 28 22 - 32 mmol/L   Glucose, Bld 133 (H) 70 - 99 mg/dL   BUN 14 8 - 23 mg/dL   Creatinine, Ser 1.17 0.61 - 1.24 mg/dL   Calcium 8.7 (L) 8.9 - 10.3 mg/dL   GFR calc non Af Amer 60 (L) >60 mL/min   GFR calc Af Amer >60 >60 mL/min   Anion gap 7 5 - 15    Dg Tibia/fibula Left  Result Date: 02/11/2019 CLINICAL DATA:  Tibial and fibular fractures EXAM: LEFT TIBIA AND FIBULA - 2 VIEW; DG C-ARM 61-120 MIN COMPARISON:  None. FLUOROSCOPY TIME:  Fluoroscopy Time:  2 minutes 1 second Radiation Exposure Index (if provided by the fluoroscopic device): 2.56 mGy Number of Acquired Spot Images: 17 FINDINGS: Initial images demonstrate a stylet extending into the proximal aspect of the tibia. This  subsequently extends distally and medullary rod is placed. Proximal and distal fixation screws are seen. Fracture fragments are in near anatomic alignment. IMPRESSION: ORIF of left tibial fracture Electronically Signed   By: Inez Catalina M.D.   On: 02/11/2019 13:01   Dg Tibia/fibula Left  Result Date: 02/10/2019 CLINICAL DATA:  Pain following fall EXAM: LEFT TIBIA AND FIBULA - 2 VIEW COMPARISON:  September 06, 2017 FINDINGS: Frontal and lateral views were obtained. There is a comminuted fracture at the junction of the proximal and mid thirds of the left tibia. There is mild lateral angulation and posterior displacement of the distal major fracture fragment with respect proximal fragment. There is a comminuted fracture of the proximal fibular diaphysis with mild posterior displacement distally. No other fractures are evident. No dislocations. There is extensive osteoarthritic change medially in the knee joint region. There is a focal lucent area in the distal tibial diaphysis measuring 1.8 x 1.5 cm. No similar changes elsewhere. IMPRESSION: 1. Comminuted fractures of the proximal tibia and fibula  as noted. No dislocation. 2. 1.8 x 1.5 cm lucent area in the distal tibia. Etiology for this lesion uncertain. It was not appreciable on prior study from 2018. A neoplastic focus must be of concern given this change since the 2018 study. Nonemergent MR of the distal tibia region is probably the optimum study of choice to further evaluate this area from an imaging standpoint. 3.  Extensive knee joint arthropathy, particularly medially. Electronically Signed   By: Lowella Grip III M.D.   On: 02/10/2019 10:30   Dg C-arm 1-60 Min  Result Date: 02/11/2019 CLINICAL DATA:  Tibial and fibular fractures EXAM: LEFT TIBIA AND FIBULA - 2 VIEW; DG C-ARM 61-120 MIN COMPARISON:  None. FLUOROSCOPY TIME:  Fluoroscopy Time:  2 minutes 1 second Radiation Exposure Index (if provided by the fluoroscopic device): 2.56 mGy Number of  Acquired Spot Images: 17 FINDINGS: Initial images demonstrate a stylet extending into the proximal aspect of the tibia. This subsequently extends distally and medullary rod is placed. Proximal and distal fixation screws are seen. Fracture fragments are in near anatomic alignment. IMPRESSION: ORIF of left tibial fracture Electronically Signed   By: Inez Catalina M.D.   On: 02/11/2019 13:01    Assessment/Plan: 1 Day Post-Op   Active Problems:   Cancer of left kidney excluding renal pelvis (H[facility])   Unsp fracture of upper end of unsp tibia, init for clos fx   Up with therapy Plan for discharge to home once he clears PT RTC in 2 weeks for staple removal Patient on Plavix and aspirin   Lovell Sheehan , MD 02/12/2019, 8:26 AM

## 2019-02-12 NOTE — Anesthesia Postprocedure Evaluation (Signed)
Anesthesia Post Note  Patient: Glen Davis  Procedure(s) Performed: INTRAMEDULLARY (IM) NAIL TIBIAL (Left Knee)  Patient location during evaluation: Nursing Unit Anesthesia Type: General Level of consciousness: awake and alert Pain management: pain level controlled Vital Signs Assessment: post-procedure vital signs reviewed and stable Respiratory status: spontaneous breathing, nonlabored ventilation, respiratory function stable and patient connected to nasal cannula oxygen Cardiovascular status: blood pressure returned to baseline and stable Postop Assessment: no apparent nausea or vomiting Anesthetic complications: no     Last Vitals:  Vitals:   02/12/19 0421 02/12/19 0758  BP: 123/70 (!) 141/77  Pulse: 72 75  Resp: 19   Temp: 37 C 36.7 C  SpO2: 100% 97%    Last Pain:  Vitals:   02/12/19 0758  TempSrc: Oral  PainSc:                  Brantley Fling

## 2019-02-12 NOTE — Progress Notes (Signed)
Discharge summary reviewed with verbal understanding, answered all questions. Belongings given upon discharge. Escorted to personal vehicle via wc.

## 2019-02-12 NOTE — Discharge Summary (Signed)
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 CAD (coronary artery disease)   . Cancer ([place])    left arm  . COPD (chronic obstructive pulmonary disease) ([place])   . Hypertension   . Kidney stone   . Lung cancer ([place])   . Melanoma ([place]) 01/24/2016   right shoulder  . Thyroid nodule 02/02/2016   left BENIGN THYROID NODULE by FNA    HOSPITAL COURSE:   78 year old male with past medical history of coronary disease, COPD, hypertension, lung cancer, history of melanoma who presents to the hospital after a mechanical fall noted to have a left tibia and fibular fracture.  1.  Acute left tibia and fibular fracture- this was secondary to a mechanical fall. -Orthopedics was consulted, patient underwent intramedullary nail of the left tibia.  Postoperatively patient was seen by physical therapy and they recommend home health services which is being arranged for the patient. -Patient is already on fentanyl patch for chronic pain and oxycodone was added to his regimen for as needed for breakthrough pain. -Patient will follow-up with orthopedics in next 2 weeks.  2.  History of chronic pain-secondary to underlying malignancy. -Continue fentanyl patch  3.  COPD-no acute exacerbation. -Patient will continue his albuterol inhaler, Spiriva.  4.  Anxiety/depression- pt. Will continue Buspar/Cymbalta.  5. Hyperlipidemia - pt. Will  cont. Atorvastatin  6. BPH - pt. Will cont. Flomax.   Stable to be discharged Home with Home Health today.   DISCHARGE CONDITIONS:   Stable.   CONSULTS OBTAINED:  Treatment Team:  Lequita Asal, MD  DRUG ALLERGIES:   Allergies  Allergen Reactions  . Lisinopril Swelling  . Other Itching    Nitroglycerin Patch.    DISCHARGE MEDICATIONS:   Allergies as of 02/12/2019      Reactions   Lisinopril Swelling   Other Itching   Nitroglycerin Patch.      Medication List    STOP taking these medications   chlorpheniramine-HYDROcodone 10-8 MG/5ML Suer Commonly known as:  TUSSIONEX   Fluticasone-Salmeterol 500-50 MCG/DOSE Aepb Commonly known as:  Advair Diskus   HYDROmorphone 2 MG tablet Commonly known as:  Dilaudid   ondansetron 8 MG tablet Commonly known as:  Zofran     TAKE these medications   albuterol 108 (90 Base) MCG/ACT inhaler Commonly known as:  PROVENTIL HFA;VENTOLIN HFA Inhale 2 puffs into the lungs every 6 (six) hours as needed for wheezing or shortness of breath.   aspirin EC 81 MG tablet Take 81 mg by mouth daily.   atorvastatin 40 MG tablet Commonly known as:  LIPITOR Take 40 mg by mouth daily.   BC HEADACHE POWDER PO Take 1 packet by mouth daily.   busPIRone 5 MG tablet Commonly known as:  BUSPAR Take 5 mg by mouth 2 (two) times daily.   cholecalciferol 10 MCG (400 UNIT) Tabs tablet Commonly known as:  VITAMIN D3 Take 400 Units by  mouth daily.   clopidogrel 75 MG tablet Commonly known as:  PLAVIX Take 75 mg by mouth daily.   DULoxetine 30 MG capsule Commonly known as:  CYMBALTA Take 2 capsules by mouth daily.   fentaNYL 50 MCG/HR Commonly known as:  [place]ana 1 patch onto the skin every 3 (three) days.   Fish Oil 1000 MG Cpdr Take 1 capsule by mouth daily.   fluticasone 50 MCG/ACT nasal spray Commonly known as:  Flonase Place 2 sprays into both nostrils daily.   Glucosamine Chond Complex/MSM Tabs Take 2 tablets  by mouth daily.   ipratropium-albuterol 0.5-2.5 (3) MG/3ML Soln Commonly known as:  DUONEB Take 3 mLs by nebulization every 4 (four) hours as needed.   loperamide 1 MG/5ML solution Commonly known as:  IMODIUM Take 4 mg by mouth as needed for diarrhea or loose stools.   loratadine 10 MG tablet Commonly known as:  CLARITIN Take 10 mg by mouth daily.   MELATONIN PO Take 10 mg by mouth at bedtime.   metFORMIN 500 MG tablet Commonly known as:  Glucophage Take 1 tablet (500 mg total) by mouth 2 (two) times daily with a meal.   montelukast 10 MG tablet Commonly known as:  SINGULAIR TAKE 1 TABLET BY MOUTH EVERYDAY AT BEDTIME   nitroGLYCERIN 0.4 MG SL tablet Commonly known as:  NITROSTAT Place 0.4 mg under the tongue every 5 (five) minutes as needed.   oxyCODONE 5 MG immediate release tablet Commonly known as:  Oxy IR/ROXICODONE Take 1 tablet (5 mg total) by mouth every 4 (four) hours as needed for moderate pain or severe pain.   OXYGEN Inhale 2 L into the lungs at bedtime.   tamsulosin 0.4 MG Caps capsule Commonly known as:  FLOMAX TAKE 1 CAPSULE (0.4 MG TOTAL) BY MOUTH EVERY MORNING.   temazepam 7.5 MG capsule Commonly known as:  RESTORIL Take 1 capsule (7.5 mg total) by mouth at bedtime as needed for sleep.   Tiotropium Bromide Monohydrate 1.25 MCG/ACT Aers Commonly known as:  Spiriva Respimat Inhale 1.25 Act into the lungs 2 (two) times daily.   tiZANidine 4 MG tablet Commonly known as:  ZANAFLEX Take 4 mg by mouth daily.   Turmeric 500 MG Tabs Take 1 tablet by mouth daily.            Durable Medical Equipment  (From admission, onward)         Start     Ordered   02/12/19 0943  For home use only DME Walker rolling  Once    Question:  Patient needs a walker to treat with the following condition  Answer:  Tibia/fibula fracture, left, closed, initial encounter   02/12/19 0942   02/12/19 0942  For home use only DME Bedside commode  Once    Question:   Patient needs a bedside commode to treat with the following condition  Answer:  Tibia/fibula fracture, left, closed, initial encounter   02/12/19 0942            DISCHARGE INSTRUCTIONS:   DIET:  Cardiac diet and Diabetic diet  DISCHARGE CONDITION:  Stable  ACTIVITY:  Activity as tolerated  OXYGEN:  Home Oxygen: No.   Oxygen Delivery: room air  DISCHARGE LOCATION:  Home with Home Health PT.    If you experience worsening of your admission symptoms, develop shortness of breath, life threatening emergency, suicidal or homicidal thoughts you must seek medical attention immediately by calling 911 or calling your MD immediately  if symptoms  less severe.  You Must read complete instructions/literature along with all the possible adverse reactions/side effects for all the Medicines you take and that have been prescribed to you. Take any new Medicines after you have completely understood and accpet all the possible adverse reactions/side effects.   Please note  You were cared for by a hospitalist during your hospital stay. If you have any questions about your discharge medications or the care you received while you were in the hospital after you are discharged, you can call the unit and asked to speak with the hospitalist on call if the hospitalist that took care of you is not available. Once you are discharged, your primary care physician will handle any further medical issues. Please note that NO REFILLS for any discharge medications will be authorized once you are discharged, as it is imperative that you return to your primary care physician (or establish a relationship with a primary care physician if you do not have one) for your aftercare needs so that they can reassess your need for medications and monitor your lab values.     Today   No acute events overnight, pain is well controlled with oral pain meds.  Patient ambulated with the help of physical therapy and they recommend  home health services which is being arranged.  No other acute complaints presently.  VITAL SIGNS:  Blood pressure 129/80, pulse 83, temperature 99.2 F (37.3 C), temperature source Oral, resp. rate 19, height 6\' 1"  (1.854 m), weight 81.6 kg, SpO2 98 %.  I/O:    Intake/Output Summary (Last 24 hours) at 02/12/2019 1343 Last data filed at 02/12/2019 1336 Gross per 24 hour  Intake 360 ml  Output 2280 ml  Net -1920 ml    PHYSICAL EXAMINATION:  GENERAL:  78 y.o.-year-old patient lying in the bed with no acute distress.  EYES: Pupils equal, round, reactive to light and accommodation. No scleral icterus. Extraocular muscles intact.  HEENT: Head atraumatic, normocephalic. Oropharynx and nasopharynx clear.  NECK:  Supple, no jugular venous distention. No thyroid enlargement, no tenderness.  LUNGS: Normal breath sounds bilaterally, no wheezing, rales,rhonchi. No use of accessory muscles of respiration.  CARDIOVASCULAR: S1, S2 normal. No murmurs, rubs, or gallops.  ABDOMEN: Soft, non-tender, non-distended. Bowel sounds present. No organomegaly or mass.  EXTREMITIES: No pedal edema, cyanosis, or clubbing.  NEUROLOGIC: Cranial nerves II through XII are intact. No focal motor or sensory defecits b/l.  PSYCHIATRIC: The patient is alert and oriented x 3.  SKIN: No obvious rash, lesion, or ulcer.   DATA REVIEW:   CBC Recent Labs  Lab 02/12/19 0301  WBC 7.6  HGB 10.3*  HCT 31.3*  PLT 140*    Chemistries  Recent Labs  Lab 02/10/19 1043 02/12/19 0301  NA 139 139  K 3.8 4.3  CL 105 104  CO2 27 28  GLUCOSE 153* 133*  BUN 12 14  CREATININE 1.02 1.17  CALCIUM 9.2 8.7*  AST 19  --   ALT 16  --   ALKPHOS 71  --   BILITOT 0.5  --     Cardiac Enzymes No results for input(s): TROPONINI in the last 168 hours.  Microbiology Results  Results for orders placed or performed during the hospital encounter of 02/10/19  Surgical pcr screen     Status: Abnormal   Collection Time: 02/10/19  11:49 PM  Result Value Ref Range Status   MRSA, PCR POSITIVE (A) NEGATIVE Final    Comment: RESULT CALLED TO, READ BACK  BY AND VERIFIED WITH: DEBRA GLADDING 02/11/2019 0110 REC    Staphylococcus aureus POSITIVE (A) NEGATIVE Final    Comment: (NOTE) The Xpert SA Assay (FDA approved for NASAL specimens in patients 86 years of age and older), is one component of a comprehensive surveillance program. It is not intended to diagnose infection nor to guide or monitor treatment. Performed at [facility], [address] [place], [place] [address]     RADIOLOGY:  Dg Tibia/fibula Left  Result Date: 02/11/2019 CLINICAL DATA:  Tibial and fibular fractures EXAM: LEFT TIBIA AND FIBULA - 2 VIEW; DG C-ARM 61-120 MIN COMPARISON:  None. FLUOROSCOPY TIME:  Fluoroscopy Time:  2 minutes 1 second Radiation Exposure Index (if provided by the fluoroscopic device): 2.56 mGy Number of Acquired Spot Images: 17 FINDINGS: Initial images demonstrate a stylet extending into the proximal aspect of the tibia. This subsequently extends distally and medullary rod is placed. Proximal and distal fixation screws are seen. Fracture fragments are in near anatomic alignment. IMPRESSION: ORIF of left tibial fracture Electronically Signed   By: Inez Catalina M.D.   On: 02/11/2019 13:01   Dg C-arm 1-60 Min  Result Date: 02/11/2019 CLINICAL DATA:  Tibial and fibular fractures EXAM: LEFT TIBIA AND FIBULA - 2 VIEW; DG C-ARM 61-120 MIN COMPARISON:  None. FLUOROSCOPY TIME:  Fluoroscopy Time:  2 minutes 1 second Radiation Exposure Index (if provided by the fluoroscopic device): 2.56 mGy Number of Acquired Spot Images: 17 FINDINGS: Initial images demonstrate a stylet extending into the proximal aspect of the tibia. This subsequently extends distally and medullary rod is placed. Proximal and distal fixation screws are seen. Fracture fragments are in near anatomic alignment. IMPRESSION: ORIF of left tibial fracture Electronically  Signed   By: Inez Catalina M.D.   On: 02/11/2019 13:01      Management plans discussed with the patient, family and they are in agreement.  CODE STATUS:     Code Status Orders  (From admission, onward)         Start     Ordered   02/10/19 1206  Full code  Continuous     02/10/19 1205          TOTAL TIME TAKING CARE OF THIS PATIENT: 40 minutes.    Henreitta Leber M.D on 02/12/2019 at 1:43 PM  Between 7am to 6pm - Pager - [phone]  After 6pm go to [email or web address] - Proofreader  [facility]  Office  [phone]  CC: Primary care physician; Dion Body, MD

## 2019-02-12 NOTE — Clinical Social Work Note (Signed)
CSW received referral for SNF.  PT recommending home health, patient plans to go home with home health.  CSW to sign off please re-consult if social work needs arise.  Jones Broom. [place], MSW, [place]

## 2019-02-12 NOTE — Evaluation (Signed)
Physical Therapy Evaluation Patient Details Name: Glen Davis MRN: [id number] DOB: 04-Jan-1941 Today's Date: 02/12/2019   History of Present Illness  78 year old male with PMHx of HTN, CAD, COPD, left kidney cancer with mets to lung and bone admitted after mechanical fall with left lower extremity tibia/fibular fracture. IM nail placed 02/11/19  Clinical Impression  Patient with good carry over of all PT cuing for safety throughout session. PT led patient through transfers and ambulation with RW which patient is able to demonstrate safety and accuracy with by end of session. PT demonstrated proper stair negotiation, and patient is able to demonstrate, needing modA for ascent and min guard for descent with L sided handrail only (to mimic home environment. Patient with remaining deficits in LE strength, balance, activity tolerance, endurance, and pain. Patient with difficulty with prolonged walking, lifting, stair negotiation, and ind mobility w/o AD activities; limiting participation in PLOF of ind with all ADLs and riding motorcycle. Would benefit from skilled PT to address above deficits and promote optimal return to PLOF     Follow Up Recommendations Home health PT    Equipment Recommendations  Rolling walker with 5" wheels;Other (comment)(raised commode)    Recommendations for Other Services       Precautions / Restrictions Precautions Precautions: Fall Restrictions Weight Bearing Restrictions: Yes LLE Weight Bearing: Partial weight bearing      Mobility  Bed Mobility Overal bed mobility: Modified Independent             General bed mobility comments: Increased time needed, no safety concerns or mobility deficits  Transfers Overall transfer level: Modified independent Equipment used: Rolling walker (2 wheeled) Transfers: Sit to/from Stand Sit to Stand: Min guard;Modified independent (Device/Increase time)         General transfer comment: Intially min gaurd for STS  transfer with cuing needed for RW safety with transfer and eccentric control with lowering, with good carry over by end of session to modI needing AD and increased time  Ambulation/Gait Ambulation/Gait assistance: Min guard;Supervision Gait Distance (Feet): 300 Feet Assistive device: Rolling walker (2 wheeled)   Gait velocity: decreased   General Gait Details: Min guard intially with cuing needed for proper RW management and WB precautions, good carry over over ambulation to supervision; reports wife and son will be home with him.   Stairs Stairs: Yes Stairs assistance: Mod assist;Min guard Stair Management: One rail Left;Sideways;Step to pattern Number of Stairs: 4 General stair comments: Patient requires mod assist for stair ascent to help offload LLE with unilateral handrail, reports his son can do this at home. Is able to descent stairs with min gaurd for safety. Able to comply with demo and cuing prior for proper stair negotation with safety, needing some phsyical assistance  Wheelchair Mobility    Modified Rankin (Stroke Patients Only)       Balance Overall balance assessment: Needs assistance Sitting-balance support: No upper extremity supported;Feet unsupported Sitting balance-Leahy Scale: Good       Standing balance-Leahy Scale: Fair                               Pertinent Vitals/Pain Pain Assessment: 0-10 Pain Score: 7  Pain Location: L tibia Pain Descriptors / Indicators: Sore Pain Intervention(s): Monitored during session;RN gave pain meds during session    Home Living Family/patient expects to be discharged to:: Private residence Living Arrangements: Spouse/significant other Available Help at Discharge: Family Type of Home:  House Home Access: Stairs to enter Entrance Stairs-Rails: Left Entrance Stairs-Number of Steps: 3 Home Layout: One level Home Equipment: Cane - single point Additional Comments: Multiple canes at home. Lives with wife  and son who is a Gaffer; availible for help at DC    Prior Function Level of Independence: Independent         Comments: Completing all ADLs, errands, driving     Hand Dominance   Dominant Hand: Right    Extremity/Trunk Assessment   Upper Extremity Assessment Upper Extremity Assessment: Overall WFL for tasks assessed    Lower Extremity Assessment Lower Extremity Assessment: Overall WFL for tasks assessed    Cervical / Trunk Assessment Cervical / Trunk Assessment: Normal  Communication   Communication: No difficulties  Cognition Arousal/Alertness: Awake/alert Behavior During Therapy: WFL for tasks assessed/performed Overall Cognitive Status: Within Functional Limits for tasks assessed                                 General Comments: O x4      General Comments      Exercises General Exercises - Lower Extremity Ankle Circles/Pumps: AROM;10 reps Long Arc Quad: AROM;10 reps(cuing for posture with good compliance following) Hip ABduction/ADduction: AROM;10 reps Hip Flexion/Marching: AROM;10 reps(cuing for posture with good compliance following) Toe Raises: AROM;10 reps Heel Raises: AROM;10 reps Other Exercises Other Exercises: Gait training over 348ft with RW where patient is able to comply with cuing for [facility] and safety with RW management over time. Stair training where patient requires modA for ascent d/t having unilateral handrail support, and min gaurd for descent with good carry over of proper negotiation with safety following PT demo   Assessment/Plan    PT Assessment Patient needs continued PT services  PT Problem List Decreased coordination;Pain;Decreased range of motion;Decreased balance;Decreased mobility;Decreased activity tolerance       PT Treatment Interventions DME instruction;Therapeutic exercise;Gait training;Balance training;Stair training;Manual techniques;Neuromuscular re-education;Functional mobility  training;Therapeutic activities;Patient/family education    PT Goals (Current goals can be found in the Care Plan section)  Acute Rehab PT Goals Patient Stated Goal: Return home with family and riding motorcylce PT Goal Formulation: With patient Time For Goal Achievement: 02/26/19 Potential to Achieve Goals: Good    Frequency Min 2X/week   Barriers to discharge        Co-evaluation               AM-PAC PT "6 Clicks" Mobility  Outcome Measure Help needed turning from your back to your side while in a flat bed without using bedrails?: None Help needed moving from lying on your back to sitting on the side of a flat bed without using bedrails?: A Little Help needed moving to and from a bed to a chair (including a wheelchair)?: None Help needed standing up from a chair using your arms (e.g., wheelchair or bedside chair)?: A Little Help needed to walk in hospital room?: A Little Help needed climbing 3-5 steps with a railing? : A Lot 6 Click Score: 19    End of Session Equipment Utilized During Treatment: Gait belt Activity Tolerance: Patient tolerated treatment well Patient left: in chair;with call bell/phone within reach;with SCD's reapplied Nurse Communication: Mobility status(PT got chair alarm, nurse said she would apply to patient) PT Visit Diagnosis: Unsteadiness on feet (R26.81);Other abnormalities of gait and mobility (R26.89);History of falling (Z91.81);Pain;Difficulty in walking, not elsewhere classified (R26.2) Pain - Right/Left: Left Pain -  part of body: Leg    Time: 0850-0928 PT Time Calculation (min) (ACUTE ONLY): 38 min   Charges:   PT Evaluation $PT Eval Low Complexity: 1 Low PT Treatments $Gait Training: 8-22 mins $Therapeutic Exercise: 8-22 mins        Shelton Silvas PT, DPT   Shelton Silvas 02/12/2019, 9:52 AM

## 2019-02-13 ENCOUNTER — Encounter: Payer: Self-pay | Admitting: Internal Medicine

## 2019-02-13 DIAGNOSIS — N400 Enlarged prostate without lower urinary tract symptoms: Secondary | ICD-10-CM | POA: Diagnosis not present

## 2019-02-13 DIAGNOSIS — Z7902 Long term (current) use of antithrombotics/antiplatelets: Secondary | ICD-10-CM | POA: Diagnosis not present

## 2019-02-13 DIAGNOSIS — I2510 Atherosclerotic heart disease of native coronary artery without angina pectoris: Secondary | ICD-10-CM | POA: Diagnosis not present

## 2019-02-13 DIAGNOSIS — Z79891 Long term (current) use of opiate analgesic: Secondary | ICD-10-CM | POA: Diagnosis not present

## 2019-02-13 DIAGNOSIS — Z87891 Personal history of nicotine dependence: Secondary | ICD-10-CM | POA: Diagnosis not present

## 2019-02-13 DIAGNOSIS — G893 Neoplasm related pain (acute) (chronic): Secondary | ICD-10-CM | POA: Diagnosis not present

## 2019-02-13 DIAGNOSIS — C642 Malignant neoplasm of left kidney, except renal pelvis: Secondary | ICD-10-CM | POA: Diagnosis not present

## 2019-02-13 DIAGNOSIS — J9611 Chronic respiratory failure with hypoxia: Secondary | ICD-10-CM | POA: Diagnosis not present

## 2019-02-13 DIAGNOSIS — C7951 Secondary malignant neoplasm of bone: Secondary | ICD-10-CM | POA: Diagnosis not present

## 2019-02-13 DIAGNOSIS — Z7982 Long term (current) use of aspirin: Secondary | ICD-10-CM | POA: Diagnosis not present

## 2019-02-13 DIAGNOSIS — S82202D Unspecified fracture of shaft of left tibia, subsequent encounter for closed fracture with routine healing: Secondary | ICD-10-CM | POA: Diagnosis not present

## 2019-02-13 DIAGNOSIS — S82402D Unspecified fracture of shaft of left fibula, subsequent encounter for closed fracture with routine healing: Secondary | ICD-10-CM | POA: Diagnosis not present

## 2019-02-13 DIAGNOSIS — C7802 Secondary malignant neoplasm of left lung: Secondary | ICD-10-CM | POA: Diagnosis not present

## 2019-02-13 DIAGNOSIS — Z7984 Long term (current) use of oral hypoglycemic drugs: Secondary | ICD-10-CM | POA: Diagnosis not present

## 2019-02-13 DIAGNOSIS — W01198D Fall on same level from slipping, tripping and stumbling with subsequent striking against other object, subsequent encounter: Secondary | ICD-10-CM | POA: Diagnosis not present

## 2019-02-13 DIAGNOSIS — Z955 Presence of coronary angioplasty implant and graft: Secondary | ICD-10-CM | POA: Diagnosis not present

## 2019-02-13 DIAGNOSIS — J449 Chronic obstructive pulmonary disease, unspecified: Secondary | ICD-10-CM | POA: Diagnosis not present

## 2019-02-18 DIAGNOSIS — R05 Cough: Secondary | ICD-10-CM | POA: Diagnosis not present

## 2019-02-18 DIAGNOSIS — J449 Chronic obstructive pulmonary disease, unspecified: Secondary | ICD-10-CM | POA: Diagnosis not present

## 2019-02-18 DIAGNOSIS — C642 Malignant neoplasm of left kidney, except renal pelvis: Secondary | ICD-10-CM | POA: Diagnosis not present

## 2019-02-18 DIAGNOSIS — R062 Wheezing: Secondary | ICD-10-CM | POA: Diagnosis not present

## 2019-02-19 DIAGNOSIS — S82402S Unspecified fracture of shaft of left fibula, sequela: Secondary | ICD-10-CM | POA: Diagnosis not present

## 2019-02-19 DIAGNOSIS — S82202S Unspecified fracture of shaft of left tibia, sequela: Secondary | ICD-10-CM | POA: Diagnosis not present

## 2019-02-19 DIAGNOSIS — I5022 Chronic systolic (congestive) heart failure: Secondary | ICD-10-CM | POA: Diagnosis not present

## 2019-02-19 DIAGNOSIS — M79662 Pain in left lower leg: Secondary | ICD-10-CM | POA: Diagnosis not present

## 2019-02-19 DIAGNOSIS — Z95810 Presence of automatic (implantable) cardiac defibrillator: Secondary | ICD-10-CM | POA: Diagnosis not present

## 2019-02-20 ENCOUNTER — Other Ambulatory Visit: Payer: Self-pay

## 2019-02-20 ENCOUNTER — Ambulatory Visit: Payer: PPO | Admitting: Radiation Oncology

## 2019-02-23 ENCOUNTER — Inpatient Hospital Stay: Payer: PPO | Attending: Oncology

## 2019-02-23 ENCOUNTER — Other Ambulatory Visit: Payer: Self-pay

## 2019-02-23 ENCOUNTER — Other Ambulatory Visit: Payer: Self-pay | Admitting: *Deleted

## 2019-02-23 ENCOUNTER — Inpatient Hospital Stay (HOSPITAL_BASED_OUTPATIENT_CLINIC_OR_DEPARTMENT_OTHER): Payer: PPO | Admitting: Internal Medicine

## 2019-02-23 ENCOUNTER — Encounter: Payer: Self-pay | Admitting: Internal Medicine

## 2019-02-23 ENCOUNTER — Inpatient Hospital Stay: Payer: PPO

## 2019-02-23 DIAGNOSIS — I10 Essential (primary) hypertension: Secondary | ICD-10-CM | POA: Diagnosis not present

## 2019-02-23 DIAGNOSIS — C7800 Secondary malignant neoplasm of unspecified lung: Secondary | ICD-10-CM | POA: Diagnosis not present

## 2019-02-23 DIAGNOSIS — C642 Malignant neoplasm of left kidney, except renal pelvis: Secondary | ICD-10-CM

## 2019-02-23 DIAGNOSIS — Z7984 Long term (current) use of oral hypoglycemic drugs: Secondary | ICD-10-CM | POA: Insufficient documentation

## 2019-02-23 DIAGNOSIS — Z87891 Personal history of nicotine dependence: Secondary | ICD-10-CM | POA: Diagnosis not present

## 2019-02-23 DIAGNOSIS — Z5112 Encounter for antineoplastic immunotherapy: Secondary | ICD-10-CM | POA: Diagnosis not present

## 2019-02-23 DIAGNOSIS — Z87442 Personal history of urinary calculi: Secondary | ICD-10-CM

## 2019-02-23 DIAGNOSIS — E041 Nontoxic single thyroid nodule: Secondary | ICD-10-CM

## 2019-02-23 DIAGNOSIS — M79605 Pain in left leg: Secondary | ICD-10-CM | POA: Insufficient documentation

## 2019-02-23 DIAGNOSIS — J449 Chronic obstructive pulmonary disease, unspecified: Secondary | ICD-10-CM

## 2019-02-23 DIAGNOSIS — G479 Sleep disorder, unspecified: Secondary | ICD-10-CM | POA: Diagnosis not present

## 2019-02-23 DIAGNOSIS — R634 Abnormal weight loss: Secondary | ICD-10-CM | POA: Diagnosis not present

## 2019-02-23 DIAGNOSIS — M25512 Pain in left shoulder: Secondary | ICD-10-CM | POA: Diagnosis not present

## 2019-02-23 DIAGNOSIS — Z7982 Long term (current) use of aspirin: Secondary | ICD-10-CM

## 2019-02-23 DIAGNOSIS — Z85820 Personal history of malignant melanoma of skin: Secondary | ICD-10-CM | POA: Insufficient documentation

## 2019-02-23 DIAGNOSIS — I2510 Atherosclerotic heart disease of native coronary artery without angina pectoris: Secondary | ICD-10-CM | POA: Insufficient documentation

## 2019-02-23 DIAGNOSIS — Z79899 Other long term (current) drug therapy: Secondary | ICD-10-CM

## 2019-02-23 DIAGNOSIS — Z7189 Other specified counseling: Secondary | ICD-10-CM

## 2019-02-23 LAB — CBC WITH DIFFERENTIAL/PLATELET
Abs Immature Granulocytes: 0 10*3/uL (ref 0.00–0.07)
Basophils Absolute: 0 10*3/uL (ref 0.0–0.1)
Basophils Relative: 0 %
Eosinophils Absolute: 0.1 10*3/uL (ref 0.0–0.5)
Eosinophils Relative: 3 %
HCT: 32.9 % — ABNORMAL LOW (ref 39.0–52.0)
Hemoglobin: 11 g/dL — ABNORMAL LOW (ref 13.0–17.0)
Immature Granulocytes: 0 %
Lymphocytes Relative: 12 %
Lymphs Abs: 0.5 10*3/uL — ABNORMAL LOW (ref 0.7–4.0)
MCH: 29.8 pg (ref 26.0–34.0)
MCHC: 33.4 g/dL (ref 30.0–36.0)
MCV: 89.2 fL (ref 80.0–100.0)
Monocytes Absolute: 0.6 10*3/uL (ref 0.1–1.0)
Monocytes Relative: 14 %
Neutro Abs: 2.7 10*3/uL (ref 1.7–7.7)
Neutrophils Relative %: 71 %
Platelets: 127 10*3/uL — ABNORMAL LOW (ref 150–400)
RBC: 3.69 MIL/uL — ABNORMAL LOW (ref 4.22–5.81)
RDW: 15.2 % (ref 11.5–15.5)
WBC: 3.9 10*3/uL — ABNORMAL LOW (ref 4.0–10.5)
nRBC: 0 % (ref 0.0–0.2)

## 2019-02-23 LAB — COMPREHENSIVE METABOLIC PANEL
ALT: 17 U/L (ref 0–44)
AST: 23 U/L (ref 15–41)
Albumin: 3.6 g/dL (ref 3.5–5.0)
Alkaline Phosphatase: 102 U/L (ref 38–126)
Anion gap: 7 (ref 5–15)
BUN: 17 mg/dL (ref 8–23)
CO2: 23 mmol/L (ref 22–32)
Calcium: 8.8 mg/dL — ABNORMAL LOW (ref 8.9–10.3)
Chloride: 102 mmol/L (ref 98–111)
Creatinine, Ser: 1.02 mg/dL (ref 0.61–1.24)
GFR calc Af Amer: >60 mL/min (ref >60)
GFR calc non Af Amer: >60 mL/min (ref >60)
Glucose, Bld: 146 mg/dL — ABNORMAL HIGH (ref 70–99)
Potassium: 4.3 mmol/L (ref 3.5–5.1)
Sodium: 132 mmol/L — ABNORMAL LOW (ref 135–145)
Total Bilirubin: 0.7 mg/dL (ref 0.3–1.2)
Total Protein: 6.6 g/dL (ref 6.5–8.1)

## 2019-02-23 LAB — TSH: TSH: 5.547 u[IU]/mL — ABNORMAL HIGH (ref 0.350–4.500)

## 2019-02-23 MED ORDER — ATORVASTATIN CALCIUM 40 MG PO TABS: 40.0000 mg | ORAL_TABLET | Freq: Every day | ORAL | 3 refills | Status: AC

## 2019-02-23 MED ORDER — FENTANYL 50 MCG/HR TD PT72: 1.0000 | MEDICATED_PATCH | TRANSDERMAL | 0 refills | Status: DC

## 2019-02-23 MED ORDER — SODIUM CHLORIDE 0.9 % IV SOLN
Freq: Once | INTRAVENOUS | Status: AC
  Administered 2019-02-23: 10:00:00 via INTRAVENOUS
  Filled 2019-02-23: qty 250

## 2019-02-23 MED ORDER — TEMAZEPAM 7.5 MG PO CAPS: 7.5000 mg | ORAL_CAPSULE | Freq: Every evening | ORAL | 3 refills | Status: AC | PRN

## 2019-02-23 MED ORDER — SODIUM CHLORIDE 0.9 % IV SOLN
2.8700 mg/kg | Freq: Once | INTRAVENOUS | Status: AC
  Administered 2019-02-23: 11:00:00 240 mg via INTRAVENOUS
  Filled 2019-02-23: qty 24

## 2019-02-23 MED ORDER — DIPHENHYDRAMINE HCL 50 MG/ML IJ SOLN
25.0000 mg | Freq: Once | INTRAMUSCULAR | Status: AC
  Administered 2019-02-23: 25 mg via INTRAVENOUS
  Filled 2019-02-23: qty 1

## 2019-02-23 MED ORDER — FAMOTIDINE IN NACL 20-0.9 MG/50ML-% IV SOLN
20.0000 mg | Freq: Once | INTRAVENOUS | Status: AC
  Administered 2019-02-23: 20 mg via INTRAVENOUS
  Filled 2019-02-23: qty 50

## 2019-02-23 MED ORDER — SODIUM CHLORIDE 0.9 % IV SOLN
1.0000 mg/kg | Freq: Once | INTRAVENOUS | Status: AC
  Administered 2019-02-23: 85 mg via INTRAVENOUS
  Filled 2019-02-23: qty 17

## 2019-02-23 NOTE — Assessment & Plan Note (Addendum)
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 Labs today reviewed;  acceptable for treatment. Will order imaging at next visit.   # Left LE fracture-likely pathologic; sent message to Dr.Bowers/ [facility]. ? Role for RE-Irradiation. Will discuss with Dr.Crystal.   # Left shoulder pain/ left leg pain- fenatnyl patch every 3 days; Dilaudid as needed. New scripts given.  Continue [place] Hard 3/16].  # DISPOSITION: # treatment today; add TSH to labs today # follow up in 3 weeks- x-MD/labs;cbc/cmp;IPI+NIVO/ X-geva-- Dr.B

## 2019-02-23 NOTE — Progress Notes (Signed)
[facility] OFFICE PROGRESS NOTE  Patient Care Team: Glen Body, MD as PCP - General (Family Medicine) [facility] Mantis, MD (Dermatology) [facility], Glen Gleason, MD (General Surgery) [facility] Espy, MD as Consulting Physician (Urology) [facility] Sickle, MD as Medical Oncologist (Medical Oncology) [facility] Cowman, MD as Consulting Physician (Cardiology) Glen Bouillon, MD as Referring Physician (Cardiology)  Cancer Staging No matching staging information was found for the patient.   Oncology History   # MAY- June 2017- METASTATIC CLEAR CELL; LEFT RENAL CA/STAGE IV; [place]- G-3; INTERMEDIATE RISK;  [bil Pul lung nodules;incidental s/p Bx Dr.Byrnett/Dr.Oaks] s/p Cytoreductive nephrectomy; Dr.Brandon- [id number]; July 11th 2017 CT- Enlarging Lung nodules  # Aug 7th 2017- PAZOPANIB; STOP SEP 2017 [pancreatitis/poor tol]  # OCT 1 week- START NIVO q 2W x4; DEC 8th CT- "Progression"; Continue Opdivo;APRIL-MAY 2018- STABLE LUNG NODULES [stopped sec to severe cough; NO Pneumonitis]  # May 31st 2018- Cabo 58m/day; July 31st CT lung- improved lung nodules; Sep 1st week- cabo- 440mday [dose reduced sec to mult intol]; Oct 1st 2week-Cabo 2022may;   # NOV 15th 2018- CT chest- slight progression of lung nodules [poor tol to higer doses];   # Nov 28th 2018- SUTENT 50 mg 2w-on  &1 w-OFF;  # MARCH 2019- Progression; start Axitinib 5 mg BID; July 2019- MIXED response; worsening/new skeletal lesions/stable to improved lung lesions; STOP Axitinib;   # Left hip s/p RT [mid-AUG 2019]  # AUG 12th- RE-START CABO 40 mg/day; STOP SEP 2019- sec to intol [abdominal pain even on 69m38my]; [facility] q W x 12 treatments; May 28, 2019-progression of the lung nodules bone lesions.  # Jan 2020- 13th-Lenvima 12 mg; disocn F3b 2020; clinical progression  # Feb 20th, 2020- IPI+NIVO  # March 2017-  Malignant melanoma of the  intrascapular area on the back ]right side];STAGE I  0.42 millimeter depth; s/p WLE [Dr.Byrnett]  ---------------------------------------------------------    # March 2019- Molecular testing- PDL-1 NEG; TMB-Low; MSI-STABLE; No targets; VHL mutation of  unknown significance  -----------------------------------------------------   # Dx; Kidney cancer/ clear cell STAGE IV  Current treatment: IPI+NIVO Goal: Palliative     [facility]   INTERVAL HISTORY:  [facility] [id number].  male [facility] patient above history of metastatic renal cell cancer currently on ipi+ nivo status post cycle 2 is here for follow-up.  In the interim patient was admitted to hospital for left lower extremity fracture.  Patient had ORIF done.  Suspected pathologic.  Mild to moderate fatigue.  Positive weight loss.  No nausea vomiting or diarrhea.  No skin rash.  Review of Systems  Constitutional: Positive for malaise/fatigue. Negative for chills, diaphoresis and fever.  HENT: Negative for nosebleeds and sore throat.   Eyes: Negative for double vision.  Respiratory: Negative for cough, hemoptysis, sputum production, shortness of breath and wheezing.   Cardiovascular: Negative for chest pain, palpitations, orthopnea and leg swelling.  Gastrointestinal: Positive for constipation. Negative for blood in stool, heartburn, melena and vomiting.  Genitourinary: Negative for dysuria, frequency and urgency.  Musculoskeletal: Positive for back pain and joint pain (left shoulder pain).       In cast.  Skin: Negative.  Negative for itching and rash.  Neurological: Negative for tingling, focal weakness and weakness.  Endo/Heme/Allergies: Does not bruise/bleed easily.  Psychiatric/Behavioral: Negative for depression. The patient is not nervous/anxious.       PAST MEDICAL  HISTORY :  Past Medical History:  Diagnosis Date  . CAD (coronary artery disease)   . Cancer ([place])    left  arm  . COPD (chronic obstructive pulmonary disease) ([place])   . Hypertension   . Kidney stone   . Lung cancer ([place])   . Melanoma ([place]) 01/24/2016   right shoulder  . Thyroid nodule 02/02/2016   left BENIGN THYROID NODULE by FNA    PAST SURGICAL HISTORY :   Past Surgical History:  Procedure Laterality Date  . arm surgery Left    arm  . cardiac stents  2011   Angioplasty / Stenting Femoral-X2  . COLONOSCOPY  04/01/12  . CORONARY ANGIOPLASTY    . EXCISION MELANOMA WITH SENTINEL LYMPH NODE BIOPSY Right 01/24/2016   Procedure: EXCISION MELANOMA Right Shoulder;  Surgeon: [facility] Bellow, MD;  Location: [facility] ORS;  Service: General;  Laterality: Right;  . LAPAROSCOPIC NEPHRECTOMY, HAND ASSISTED Left 04/24/2016   Procedure: HAND ASSISTED LAPAROSCOPIC NEPHRECTOMY;  Surgeon: Glen Espy, MD;  Location: [facility] ORS;  Service: Urology;  Laterality: Left;  . TIBIA IM NAIL INSERTION Left 02/11/2019   Procedure: INTRAMEDULLARY (IM) NAIL TIBIAL;  Surgeon: Lovell Sheehan, MD;  Location: [facility] ORS;  Service: Orthopedics;  Laterality: Left;  Marland Kitchen VIDEO ASSISTED THORACOSCOPY (VATS)/THOROCOTOMY Left 03/08/2016   Procedure: VIDEO ASSISTED THORACOSCOPY (VATS) with lung biopsy - Left ;  Surgeon: [facility] Bellow, MD;  Location: [facility] ORS;  Service: General;  Laterality: Left;    FAMILY HISTORY :   Family History  Problem Relation Age of Onset  . Hodgkin's lymphoma Daughter 5  . Heart attack Father   . Kidney cancer Neg Hx   . Kidney disease Neg Hx   . Prostate cancer Neg Hx     SOCIAL HISTORY:   Social History   Tobacco Use  . Smoking status: Former Smoker    Packs/day: 2.00    Years: 30.00    Pack years: 60.00    Types: Cigarettes    Start date: 01/18/1956    Last attempt to quit: 01/17/1969    Years since quitting: 50.1  . Smokeless tobacco: Never Used  Substance Use Topics  . Alcohol use: Not Currently    Alcohol/week: 0.0 standard drinks    Comment: BEER OCC  . Drug use: No    ALLERGIES:  is  allergic to lisinopril and other.  MEDICATIONS:  Current Outpatient Medications  Medication Sig Dispense Refill  . albuterol (PROVENTIL HFA;VENTOLIN HFA) 108 (90 Base) MCG/ACT inhaler Inhale 2 puffs into the lungs every 6 (six) hours as needed for wheezing or shortness of breath. 1 Inhaler 2  . aspirin EC 81 MG tablet Take 81 mg by mouth daily.    . Aspirin-Salicylamide-Caffeine (BC HEADACHE POWDER PO) Take 1 packet by mouth daily.    Marland Kitchen atorvastatin (LIPITOR) 40 MG tablet Take 1 tablet (40 mg total) by mouth daily. 30 tablet 3  . busPIRone (BUSPAR) 5 MG tablet Take 5 mg by mouth 2 (two) times daily.    . cholecalciferol (VITAMIN D) 400 units TABS tablet Take 400 Units by mouth daily.    . clopidogrel (PLAVIX) 75 MG tablet Take 75 mg by mouth daily.    . DULoxetine (CYMBALTA) 30 MG capsule Take 2 capsules by mouth daily.    . fentaNYL (DURAGESIC) 50 MCG/HR Place 1 patch onto the skin every 3 (three) days. 10 patch 0  . fluticasone (FLONASE) 50 MCG/ACT nasal spray Place 2 sprays into both nostrils daily. [place]  g 2  . ipratropium-albuterol (DUONEB) 0.5-2.5 (3) MG/3ML SOLN Take 3 mLs by nebulization every 4 (four) hours as needed. 360 mL 3  . MELATONIN PO Take 10 mg by mouth at bedtime.     . metFORMIN (GLUCOPHAGE) 500 MG tablet Take 1 tablet (500 mg total) by mouth 2 (two) times daily with a meal. 60 tablet 3  . Misc Natural Products (GLUCOSAMINE CHOND COMPLEX/MSM) TABS Take 2 tablets by mouth daily.    . montelukast (SINGULAIR) 10 MG tablet TAKE 1 TABLET BY MOUTH EVERYDAY AT BEDTIME 90 tablet 1  . Omega-3 Fatty Acids (FISH OIL) 1000 MG CPDR Take 1 capsule by mouth daily.    Marland Kitchen oxyCODONE (OXY IR/ROXICODONE) 5 MG immediate release tablet Take 1 tablet (5 mg total) by mouth every 4 (four) hours as needed for moderate pain or severe pain. 30 tablet 0  . OXYGEN Inhale 2 L into the lungs at bedtime.    . tamsulosin (FLOMAX) 0.4 MG CAPS capsule TAKE 1 CAPSULE (0.4 MG TOTAL) BY MOUTH EVERY MORNING. 30  capsule 3  . temazepam (RESTORIL) 7.5 MG capsule Take 1 capsule (7.5 mg total) by mouth at bedtime as needed for sleep. 30 capsule 3  . Tiotropium Bromide Monohydrate (SPIRIVA RESPIMAT) 1.25 MCG/ACT AERS Inhale 1.25 Act into the lungs 2 (two) times daily. 1 Inhaler 11  . tiZANidine (ZANAFLEX) 4 MG tablet Take 4 mg by mouth daily.     . Turmeric 500 MG TABS Take 1 tablet by mouth daily.    . CV[employer] CITRATE OF MAGNESIA SOLN Take 15 mLs by mouth as needed for constipation.    Marland Kitchen loperamide (IMODIUM) 1 MG/5ML solution Take 4 mg by mouth as needed for diarrhea or loose stools.    Marland Kitchen loratadine (CLARITIN) 10 MG tablet Take 10 mg by mouth daily.    . nitroGLYCERIN (NITROSTAT) 0.4 MG SL tablet Place 0.4 mg under the tongue every 5 (five) minutes as needed.      No current facility-administered medications for this visit.    Facility-Administered Medications Ordered in Other Visits  Medication Dose Route Frequency Provider Last Rate Last Dose  . 0.9 %  sodium chloride infusion   Intravenous Continuous [facility] Sickle, MD 10 mL/hr at 10/27/18 1054      PHYSICAL EXAMINATION: ECOG PERFORMANCE STATUS: 1 - Symptomatic but completely ambulatory  BP 112/74 (BP Location: Left Arm, Patient Position: Sitting, Cuff Size: Normal)   Pulse 83   Temp 97.6 F (36.4 C) (Tympanic)   Resp 20   Ht '6\' 1"'  (1.854 m)   Wt 175 lb (79.4 kg)   BMI 23.09 kg/m   Filed Weights   02/23/19 0844  Weight: 175 lb (79.4 kg)    Physical Exam  Constitutional: He is oriented to person, place, and time and well-developed, well-nourished, and in no distress.  Alone.  He is in a wheelchair.  HENT:  Head: Normocephalic and atraumatic.  Mouth/Throat: Oropharynx is clear and moist. No oropharyngeal exudate.  Eyes: Pupils are equal, round, and reactive to light.  Neck: Normal range of motion. Neck supple.  Cardiovascular: Normal rate and regular rhythm.  Pulmonary/Chest: No respiratory distress. He has no wheezes.   Abdominal: Soft. Bowel sounds are normal. He exhibits no distension and no mass. There is no guarding.  Musculoskeletal:        General: No tenderness or edema.     Comments: In cast.  Neurological: He is alert and oriented to person, place, and time.  Skin:  Skin is warm.  Psychiatric: Affect normal.       LABORATORY DATA:  I have reviewed the data as listed    Component Value Date/Time   NA 132 (L) 02/23/2019 0814   NA 146 (H) 05/25/2016 1514   K 4.3 02/23/2019 0814   CL 102 02/23/2019 0814   CO2 23 02/23/2019 0814   GLUCOSE 146 (H) 02/23/2019 0814   BUN 17 02/23/2019 0814   BUN 19 05/25/2016 1514   CREATININE 1.02 02/23/2019 0814   CALCIUM 8.8 (L) 02/23/2019 0814   PROT 6.6 02/23/2019 0814   ALBUMIN 3.6 02/23/2019 0814   AST 23 02/23/2019 0814   ALT 17 02/23/2019 0814   ALKPHOS 102 02/23/2019 0814   BILITOT 0.7 02/23/2019 0814   GFRNONAA >60 02/23/2019 0814   GFRAA >60 02/23/2019 0814    No results found for: SPEP, UPEP  Lab Results  Component Value Date   WBC 3.9 (L) 02/23/2019   NEUTROABS 2.7 02/23/2019   HGB 11.0 (L) 02/23/2019   HCT 32.9 (L) 02/23/2019   MCV 89.2 02/23/2019   PLT 127 (L) 02/23/2019      Chemistry      Component Value Date/Time   NA 132 (L) 02/23/2019 0814   NA 146 (H) 05/25/2016 1514   K 4.3 02/23/2019 0814   CL 102 02/23/2019 0814   CO2 23 02/23/2019 0814   BUN 17 02/23/2019 0814   BUN 19 05/25/2016 1514   CREATININE 1.02 02/23/2019 0814      Component Value Date/Time   CALCIUM 8.8 (L) 02/23/2019 0814   ALKPHOS 102 02/23/2019 0814   AST 23 02/23/2019 0814   ALT 17 02/23/2019 0814   BILITOT 0.7 02/23/2019 0814       RADIOGRAPHIC STUDIES: I have personally reviewed the radiological images as listed and agreed with the findings in the report. No results found.   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 Labs today reviewed;  acceptable for treatment. Will order imaging at next visit.   # Left LE fracture-likely pathologic; sent message to Dr.Bowers/ Otho. ? Role for RE-Irradiation. Will discuss with Dr.Crystal.   # Left shoulder pain/ left leg pain- fenatnyl patch every 3 days; Dilaudid as needed. New scripts given.  Continue [place] Hard 3/16].  # DISPOSITION: # treatment today; add TSH to labs today # follow up in 3 weeks- x-MD/labs;cbc/cmp;IPI+NIVO/ X-geva-- Dr.B   No orders of the defined types were placed in this encounter.  All questions were answered. The patient knows to call the clinic with any problems, questions or concerns.      [facility] Sickle, MD 02/23/2019 9:20 AM

## 2019-02-26 ENCOUNTER — Other Ambulatory Visit: Payer: Self-pay | Admitting: Internal Medicine

## 2019-03-10 DIAGNOSIS — Z85820 Personal history of malignant melanoma of skin: Secondary | ICD-10-CM | POA: Diagnosis not present

## 2019-03-10 DIAGNOSIS — L814 Other melanin hyperpigmentation: Secondary | ICD-10-CM | POA: Diagnosis not present

## 2019-03-10 DIAGNOSIS — I781 Nevus, non-neoplastic: Secondary | ICD-10-CM | POA: Diagnosis not present

## 2019-03-10 DIAGNOSIS — L720 Epidermal cyst: Secondary | ICD-10-CM | POA: Diagnosis not present

## 2019-03-10 DIAGNOSIS — L570 Actinic keratosis: Secondary | ICD-10-CM | POA: Diagnosis not present

## 2019-03-16 ENCOUNTER — Inpatient Hospital Stay (HOSPITAL_BASED_OUTPATIENT_CLINIC_OR_DEPARTMENT_OTHER): Payer: PPO | Admitting: Internal Medicine

## 2019-03-16 ENCOUNTER — Inpatient Hospital Stay: Payer: PPO

## 2019-03-16 ENCOUNTER — Other Ambulatory Visit: Payer: Self-pay

## 2019-03-16 ENCOUNTER — Encounter: Payer: Self-pay | Admitting: Internal Medicine

## 2019-03-16 ENCOUNTER — Other Ambulatory Visit: Payer: Self-pay | Admitting: *Deleted

## 2019-03-16 DIAGNOSIS — M25512 Pain in left shoulder: Secondary | ICD-10-CM

## 2019-03-16 DIAGNOSIS — R634 Abnormal weight loss: Secondary | ICD-10-CM

## 2019-03-16 DIAGNOSIS — C7951 Secondary malignant neoplasm of bone: Secondary | ICD-10-CM

## 2019-03-16 DIAGNOSIS — Z79899 Other long term (current) drug therapy: Secondary | ICD-10-CM

## 2019-03-16 DIAGNOSIS — I10 Essential (primary) hypertension: Secondary | ICD-10-CM

## 2019-03-16 DIAGNOSIS — Z7984 Long term (current) use of oral hypoglycemic drugs: Secondary | ICD-10-CM

## 2019-03-16 DIAGNOSIS — Z7189 Other specified counseling: Secondary | ICD-10-CM

## 2019-03-16 DIAGNOSIS — I2510 Atherosclerotic heart disease of native coronary artery without angina pectoris: Secondary | ICD-10-CM | POA: Diagnosis not present

## 2019-03-16 DIAGNOSIS — G479 Sleep disorder, unspecified: Secondary | ICD-10-CM

## 2019-03-16 DIAGNOSIS — J449 Chronic obstructive pulmonary disease, unspecified: Secondary | ICD-10-CM | POA: Diagnosis not present

## 2019-03-16 DIAGNOSIS — M79605 Pain in left leg: Secondary | ICD-10-CM | POA: Diagnosis not present

## 2019-03-16 DIAGNOSIS — Z87442 Personal history of urinary calculi: Secondary | ICD-10-CM

## 2019-03-16 DIAGNOSIS — Z7982 Long term (current) use of aspirin: Secondary | ICD-10-CM

## 2019-03-16 DIAGNOSIS — E041 Nontoxic single thyroid nodule: Secondary | ICD-10-CM

## 2019-03-16 DIAGNOSIS — C7800 Secondary malignant neoplasm of unspecified lung: Secondary | ICD-10-CM

## 2019-03-16 DIAGNOSIS — Z5112 Encounter for antineoplastic immunotherapy: Secondary | ICD-10-CM

## 2019-03-16 DIAGNOSIS — C642 Malignant neoplasm of left kidney, except renal pelvis: Secondary | ICD-10-CM

## 2019-03-16 DIAGNOSIS — Z87891 Personal history of nicotine dependence: Secondary | ICD-10-CM

## 2019-03-16 DIAGNOSIS — Z85820 Personal history of malignant melanoma of skin: Secondary | ICD-10-CM

## 2019-03-16 LAB — COMPREHENSIVE METABOLIC PANEL
ALT: 13 U/L (ref 0–44)
AST: 16 U/L (ref 15–41)
Albumin: 3.9 g/dL (ref 3.5–5.0)
Alkaline Phosphatase: 104 U/L (ref 38–126)
Anion gap: 9 (ref 5–15)
BUN: 9 mg/dL (ref 8–23)
CO2: 24 mmol/L (ref 22–32)
Calcium: 9.1 mg/dL (ref 8.9–10.3)
Chloride: 104 mmol/L (ref 98–111)
Creatinine, Ser: 1.06 mg/dL (ref 0.61–1.24)
GFR calc Af Amer: >60 mL/min (ref >60)
GFR calc non Af Amer: >60 mL/min (ref >60)
Glucose, Bld: 146 mg/dL — ABNORMAL HIGH (ref 70–99)
Potassium: 4.1 mmol/L (ref 3.5–5.1)
Sodium: 137 mmol/L (ref 135–145)
Total Bilirubin: 0.5 mg/dL (ref 0.3–1.2)
Total Protein: 6.9 g/dL (ref 6.5–8.1)

## 2019-03-16 LAB — CBC WITH DIFFERENTIAL/PLATELET
Abs Immature Granulocytes: 0.01 10*3/uL (ref 0.00–0.07)
Basophils Absolute: 0 10*3/uL (ref 0.0–0.1)
Basophils Relative: 1 %
Eosinophils Absolute: 0.2 10*3/uL (ref 0.0–0.5)
Eosinophils Relative: 3 %
HCT: 37 % — ABNORMAL LOW (ref 39.0–52.0)
Hemoglobin: 12.2 g/dL — ABNORMAL LOW (ref 13.0–17.0)
Immature Granulocytes: 0 %
Lymphocytes Relative: 13 %
Lymphs Abs: 0.8 10*3/uL (ref 0.7–4.0)
MCH: 29.3 pg (ref 26.0–34.0)
MCHC: 33 g/dL (ref 30.0–36.0)
MCV: 88.9 fL (ref 80.0–100.0)
Monocytes Absolute: 0.6 10*3/uL (ref 0.1–1.0)
Monocytes Relative: 11 %
Neutro Abs: 4.4 10*3/uL (ref 1.7–7.7)
Neutrophils Relative %: 72 %
Platelets: 121 10*3/uL — ABNORMAL LOW (ref 150–400)
RBC: 4.16 MIL/uL — ABNORMAL LOW (ref 4.22–5.81)
RDW: 15 % (ref 11.5–15.5)
WBC: 5.9 10*3/uL (ref 4.0–10.5)
nRBC: 0 % (ref 0.0–0.2)

## 2019-03-16 MED ORDER — NITROGLYCERIN 0.4 MG SL SUBL: 0.4000 mg | SUBLINGUAL_TABLET | SUBLINGUAL | 0 refills | Status: AC | PRN

## 2019-03-16 MED ORDER — DENOSUMAB 120 MG/1.7ML ~~LOC~~ SOLN
120.0000 mg | Freq: Once | SUBCUTANEOUS | Status: AC
  Administered 2019-03-16: 11:00:00 120 mg via SUBCUTANEOUS
  Filled 2019-03-16: qty 1.7

## 2019-03-16 MED ORDER — SODIUM CHLORIDE 0.9 % IV SOLN
1.0000 mg/kg | Freq: Once | INTRAVENOUS | Status: AC
  Administered 2019-03-16: 85 mg via INTRAVENOUS
  Filled 2019-03-16: qty 17

## 2019-03-16 MED ORDER — DIPHENHYDRAMINE HCL 50 MG/ML IJ SOLN
25.0000 mg | Freq: Once | INTRAMUSCULAR | Status: AC
  Administered 2019-03-16: 10:00:00 25 mg via INTRAVENOUS
  Filled 2019-03-16: qty 1

## 2019-03-16 MED ORDER — HYDROCODONE-ACETAMINOPHEN 5-325 MG PO TABS: 1.0000 | ORAL_TABLET | Freq: Four times a day (QID) | ORAL | 0 refills | Status: DC | PRN

## 2019-03-16 MED ORDER — FAMOTIDINE IN NACL 20-0.9 MG/50ML-% IV SOLN
20.0000 mg | Freq: Once | INTRAVENOUS | Status: AC
  Administered 2019-03-16: 10:00:00 20 mg via INTRAVENOUS
  Filled 2019-03-16: qty 50

## 2019-03-16 MED ORDER — FENTANYL 75 MCG/HR TD PT72: 1.0000 | MEDICATED_PATCH | TRANSDERMAL | 0 refills | Status: AC

## 2019-03-16 MED ORDER — SODIUM CHLORIDE 0.9 % IV SOLN
Freq: Once | INTRAVENOUS | Status: AC
  Administered 2019-03-16: 10:00:00 via INTRAVENOUS
  Filled 2019-03-16: qty 250

## 2019-03-16 MED ORDER — SODIUM CHLORIDE 0.9 % IV SOLN
240.0000 mg | Freq: Once | INTRAVENOUS | Status: AC
  Administered 2019-03-16: 11:00:00 240 mg via INTRAVENOUS
  Filled 2019-03-16: qty 24

## 2019-03-16 NOTE — Progress Notes (Signed)
[facility] OFFICE PROGRESS NOTE  Patient Care Team: Glen Body, MD as PCP - General (Family Medicine) Glen Mantis, MD (Dermatology) Bary Castilla, Glen Gleason, MD (General Surgery) Glen Espy, MD as Consulting Physician (Urology) Glen Sickle, MD as Medical Oncologist (Medical Oncology) Glen Cowman, MD as Consulting Physician (Cardiology) Glen Bouillon, MD as Referring Physician (Cardiology)  Cancer Staging No matching staging information was found for the patient.   Oncology History   # MAY- June 2017- METASTATIC CLEAR CELL; LEFT RENAL CA/STAGE IV; [facility]- G-3; INTERMEDIATE RISK;  [bil Pul lung nodules;incidental s/p Bx Dr.Byrnett/Dr.Oaks] s/p Cytoreductive nephrectomy; Dr.Brandon- [id number]; July 11th 2017 CT- Enlarging Lung nodules  # Aug 7th 2017- PAZOPANIB; STOP SEP 2017 [pancreatitis/poor tol]  # OCT 1 week- START NIVO q 2W x4; DEC 8th CT- "Progression"; Continue Opdivo;APRIL-MAY 2018- STABLE LUNG NODULES [stopped sec to severe cough; NO Pneumonitis]  # May 31st 2018- Cabo 69m/day; July 31st CT lung- improved lung nodules; Sep 1st week- cabo- 453mday [dose reduced sec to mult intol]; Oct 1st 2week-Cabo [id number];   # NOV 15th 2018- CT chest- slight progression of lung nodules [poor tol to higer doses];   # Nov 28th 2018- SUTENT 50 mg 2w-on  &1 w-OFF;  # MARCH 2019- Progression; start Axitinib 5 mg BID; July 2019- MIXED response; worsening/new skeletal lesions/stable to improved lung lesions; STOP Axitinib;   # Left hip s/p RT [mid-AUG 2019]  # AUG 12th- RE-START CABO 40 mg/day; STOP SEP 2019- sec to intol [abdominal pain even on 3m11my]; [facility] q W x 12 treatments; May 28, 2019-progression of the lung nodules bone lesions.  # Jan 2020- 13th-Lenvima 12 mg; disocn F3b 2020; clinical progression  # Feb 20th, 2020- IPI+NIVO  # March 2017-  Malignant melanoma of the  intrascapular area on the back ]right side];STAGE I  0.42 millimeter depth; s/p WLE [Dr.Byrnett]  ---------------------------------------------------------    # March 2019- Molecular testing- PDL-1 NEG; TMB-Low; MSI-STABLE; No targets; VHL mutation of  unknown significance  -----------------------------------------------------   # Dx; Kidney cancer/ clear cell STAGE IV  Current treatment: IPI+NIVO Goal: Palliative     [facility]   INTERVAL HISTORY:  Glen [id number].  male pleasant patient above history of metastatic renal cell cancer currently on ipi+ nivo status post cycle 3 is here for follow-up.  Patient complains of worsening pain in the left leg.  Also complains of pain in his left shoulder.  Difficulty sleeping at night.  Otherwise no diarrhea abdominal pain.  No worsening skin rash.  Review of Systems  Constitutional: Positive for malaise/fatigue. Negative for chills, diaphoresis and fever.  HENT: Negative for nosebleeds and sore throat.   Eyes: Negative for double vision.  Respiratory: Negative for cough, hemoptysis, sputum production, shortness of breath and wheezing.   Cardiovascular: Negative for chest pain, palpitations, orthopnea and leg swelling.  Gastrointestinal: Positive for constipation. Negative for blood in stool, heartburn, melena and vomiting.  Genitourinary: Negative for dysuria, frequency and urgency.  Musculoskeletal: Positive for back pain and joint pain (left shoulder pain).       In cast.  Skin: Negative.  Negative for itching and rash.  Neurological: Negative for tingling, focal weakness and weakness.  Endo/Heme/Allergies: Does not bruise/bleed easily.  Psychiatric/Behavioral: Negative for depression. The patient is not nervous/anxious.       PAST MEDICAL HISTORY :  Past Medical History:  Diagnosis Date  . CAD (coronary artery disease)   . Cancer (HCC)    left arm  . COPD (chronic obstructive  pulmonary disease) (HCC)   . Hypertension   . Kidney stone   . Lung cancer (HCC)   . Melanoma (HCC) 01/24/2016   right shoulder  . Thyroid nodule 02/02/2016   left BENIGN THYROID NODULE by FNA    PAST SURGICAL HISTORY :   Past Surgical History:  Procedure Laterality Date  . arm surgery Left    arm  . cardiac stents  2011   Angioplasty / Stenting Femoral-X2  . COLONOSCOPY  04/01/12  . CORONARY ANGIOPLASTY    . EXCISION MELANOMA WITH SENTINEL LYMPH NODE BIOPSY Right 01/24/2016   Procedure: EXCISION MELANOMA Right Shoulder;  Surgeon: Glen W Byrnett, MD;  Location: [facility] ORS;  Service: General;  Laterality: Right;  . LAPAROSCOPIC NEPHRECTOMY, HAND ASSISTED Left 04/24/2016   Procedure: HAND ASSISTED LAPAROSCOPIC NEPHRECTOMY;  Surgeon: Glen Brandon, MD;  Location: [facility] ORS;  Service: Urology;  Laterality: Left;  . TIBIA IM NAIL INSERTION Left 02/11/2019   Procedure: INTRAMEDULLARY (IM) NAIL TIBIAL;  Surgeon: Bowers, James R, MD;  Location: [facility] ORS;  Service: Orthopedics;  Laterality: Left;  . VIDEO ASSISTED THORACOSCOPY (VATS)/THOROCOTOMY Left 03/08/2016   Procedure: VIDEO ASSISTED THORACOSCOPY (VATS) with lung biopsy - Left ;  Surgeon: Glen W Byrnett, MD;  Location: [facility] ORS;  Service: General;  Laterality: Left;    FAMILY HISTORY :   Family History  Problem Relation Age of Onset  . Hodgkin's lymphoma Daughter 5  . Heart attack Father   . Kidney cancer Neg Hx   . Kidney disease Neg Hx   . Prostate cancer Neg Hx     SOCIAL HISTORY:   Social History   Tobacco Use  . Smoking status: Former Smoker    Packs/day: 2.00    Years: 30.00    Pack years: 60.00    Types: Cigarettes    Start date: 01/18/1956    Last attempt to quit: 01/17/1969    Years since quitting: 50.1  . Smokeless tobacco: Never Used  Substance Use Topics  . Alcohol use: Not Currently    Alcohol/week: 0.0 standard drinks    Comment: BEER OCC  . Drug use: No    ALLERGIES:  is allergic to lisinopril and  other.  MEDICATIONS:  Current Outpatient Medications  Medication Sig Dispense Refill  . albuterol (PROVENTIL HFA;VENTOLIN HFA) 108 (90 Base) MCG/ACT inhaler Inhale 2 puffs into the lungs every 6 (six) hours as needed for wheezing or shortness of breath. 1 Inhaler 2  . aspirin EC 81 MG tablet Take 81 mg by mouth daily.    . Aspirin-Salicylamide-Caffeine (BC HEADACHE POWDER PO) Take 1 packet by mouth daily.    . atorvastatin (LIPITOR) 40 MG tablet Take 1 tablet (40 mg total) by mouth daily. 30 tablet 3  . busPIRone (BUSPAR) 5 MG tablet Take 5 mg by mouth 2 (two) times daily.    . cholecalciferol (VITAMIN D) 400 units TABS tablet Take 400 Units by mouth daily.    . clopidogrel (PLAVIX) 75 MG tablet Take 75 mg by mouth daily.    . CVS CITRATE OF MAGNESIA SOLN Take 15 mLs by mouth as needed for constipation.    . DULoxetine (CYMBALTA) 30 MG capsule Take 2 capsules by mouth daily.    . fluticasone (FLONASE) 50 MCG/ACT nasal spray Place 2 sprays into both nostrils daily. 16 g 2  . ipratropium-albuterol (DUONEB) 0.5-2.5 (3)   MG/3ML SOLN Take 3 mLs by nebulization every 4 (four) hours as needed. 360 mL 3  . loperamide (IMODIUM) 1 MG/5ML solution Take 4 mg by mouth as needed for diarrhea or loose stools.    . loratadine (CLARITIN) 10 MG tablet Take 10 mg by mouth daily.    . MELATONIN PO Take 10 mg by mouth at bedtime.     . metFORMIN (GLUCOPHAGE) 500 MG tablet TAKE 1 TABLET (500 MG TOTAL) BY MOUTH 2 (TWO) TIMES DAILY WITH A MEAL. 60 tablet 3  . Misc Natural Products (GLUCOSAMINE CHOND COMPLEX/MSM) TABS Take 2 tablets by mouth daily.    . montelukast (SINGULAIR) 10 MG tablet TAKE 1 TABLET BY MOUTH EVERYDAY AT BEDTIME 90 tablet 1  . nitroGLYCERIN (NITROSTAT) 0.4 MG SL tablet Place 1 tablet (0.4 mg total) under the tongue every 5 (five) minutes as needed. 30 tablet 0  . Omega-3 Fatty Acids (FISH OIL) 1000 MG CPDR Take 1 capsule by mouth daily.    . oxyCODONE (OXY IR/ROXICODONE) 5 MG immediate release  tablet Take 1 tablet (5 mg total) by mouth every 4 (four) hours as needed for moderate pain or severe pain. 30 tablet 0  . OXYGEN Inhale 2 L into the lungs at bedtime.    . tamsulosin (FLOMAX) 0.4 MG CAPS capsule TAKE 1 CAPSULE (0.4 MG TOTAL) BY MOUTH EVERY MORNING. 30 capsule 3  . temazepam (RESTORIL) 7.5 MG capsule Take 1 capsule (7.5 mg total) by mouth at bedtime as needed for sleep. 30 capsule 3  . Tiotropium Bromide Monohydrate (SPIRIVA RESPIMAT) 1.25 MCG/ACT AERS Inhale 1.25 Act into the lungs 2 (two) times daily. 1 Inhaler 11  . tiZANidine (ZANAFLEX) 4 MG tablet Take 4 mg by mouth daily.     . Turmeric 500 MG TABS Take 1 tablet by mouth daily.    . fentaNYL (DURAGESIC) 75 MCG/HR Place 1 patch onto the skin every 3 (three) days. 10 patch 0  . HYDROcodone-acetaminophen (NORCO/VICODIN) 5-325 MG tablet Take 1 tablet by mouth every 6 (six) hours as needed for moderate pain. 60 tablet 0   No current facility-administered medications for this visit.    Facility-Administered Medications Ordered in Other Visits  Medication Dose Route Frequency Provider Last Rate Last Dose  . 0.9 %  sodium chloride infusion   Intravenous Continuous ,  R, MD 10 mL/hr at 10/27/18 1054      PHYSICAL EXAMINATION: ECOG PERFORMANCE STATUS: 1 - Symptomatic but completely ambulatory  BP (!) 129/8 (BP Location: Left Arm)   Pulse 80   Temp (!) 95.6 F (35.3 C)   Resp 18   Wt 175 lb 6.4 oz (79.6 kg)   BMI 23.14 kg/m   Filed Weights   03/16/19 0910  Weight: 175 lb 6.4 oz (79.6 kg)    Physical Exam  Constitutional: He is oriented to person, place, and time and well-developed, well-nourished, and in no distress.  Alone.  He is in a wheelchair.  HENT:  Head: Normocephalic and atraumatic.  Mouth/Throat: Oropharynx is clear and moist. No oropharyngeal exudate.  Eyes: Pupils are equal, round, and reactive to light.  Neck: Normal range of motion. Neck supple.  Cardiovascular: Normal rate and  regular rhythm.  Pulmonary/Chest: No respiratory distress. He has no wheezes.  Abdominal: Soft. Bowel sounds are normal. He exhibits no distension and no mass. There is no guarding.  Musculoskeletal:        General: No tenderness or edema.     Comments: In cast.  Neurological:   He is alert and oriented to person, place, and time.  Skin: Skin is warm.  Psychiatric: Affect normal.       LABORATORY DATA:  I have reviewed the data as listed    Component Value Date/Time   NA 137 03/16/2019 0840   NA 146 (H) 05/25/2016 1514   K 4.1 03/16/2019 0840   CL 104 03/16/2019 0840   CO2 24 03/16/2019 0840   GLUCOSE 146 (H) 03/16/2019 0840   BUN 9 03/16/2019 0840   BUN 19 05/25/2016 1514   CREATININE 1.06 03/16/2019 0840   CALCIUM 9.1 03/16/2019 0840   PROT 6.9 03/16/2019 0840   ALBUMIN 3.9 03/16/2019 0840   AST 16 03/16/2019 0840   ALT 13 03/16/2019 0840   ALKPHOS 104 03/16/2019 0840   BILITOT 0.5 03/16/2019 0840   GFRNONAA >60 03/16/2019 0840   GFRAA >60 03/16/2019 0840    No results found for: SPEP, UPEP  Lab Results  Component Value Date   WBC 5.9 03/16/2019   NEUTROABS 4.4 03/16/2019   HGB 12.2 (L) 03/16/2019   HCT 37.0 (L) 03/16/2019   MCV 88.9 03/16/2019   PLT 121 (L) 03/16/2019      Chemistry      Component Value Date/Time   NA 137 03/16/2019 0840   NA 146 (H) 05/25/2016 1514   K 4.1 03/16/2019 0840   CL 104 03/16/2019 0840   CO2 24 03/16/2019 0840   BUN 9 03/16/2019 0840   BUN 19 05/25/2016 1514   CREATININE 1.06 03/16/2019 0840      Component Value Date/Time   CALCIUM 9.1 03/16/2019 0840   ALKPHOS 104 03/16/2019 0840   AST 16 03/16/2019 0840   ALT 13 03/16/2019 0840   BILITOT 0.5 03/16/2019 0840       RADIOGRAPHIC STUDIES: I have personally reviewed the radiological images as listed and agreed with the findings in the report. No results found.   ASSESSMENT & PLAN:  Cancer of left kidney excluding renal pelvis [facility])  # Left kidney cancer  metastatic to the lung-  Jan 9th CT- progression of lung/ bone lesions; currently on Ipi [1]+ nivo [3] q 3 W; clinically stable.   #  proceed with cycle #4 today. Labs today reviewed;  acceptable for treatment. Will order imaging today.   # Left LE fracture-likely pathologic; sent message to Dr.Bowers/ [facility]. ? Will discuss re: Re-Irradiation.   # Left shoulder pain/ left leg pain- not well controlled;  Increase fenatnyl patch every 3 days to 75 mcg; hydrocodone as needed. New scripts given.  Continue [place] Hard 4/27].  # DISPOSITION: # treatment today;  # Follow up in 2 weeks-MD; lab- cbc/bmp; CT scan prior: no treatment=Dr.B   Orders Placed This Encounter  Procedures  . CT CHEST W CONTRAST    Standing Status:   Future    Standing Expiration Date:   03/15/2020    Order Specific Question:   If indicated for the ordered procedure, I authorize the administration of contrast media per Radiology protocol    Answer:   Yes    Order Specific Question:   Preferred imaging location?    Answer:   [facility]    Order Specific Question:   Radiology Contrast Protocol - do NOT remove file path    Answer:   _0 [email or web address]    Order Specific Question:   ** REASON FOR EXAM (FREE TEXT)    Answer:   kidney cancer  . CT ABDOMEN PELVIS W CONTRAST  Standing Status:   Future    Standing Expiration Date:   03/15/2020    Order Specific Question:   If indicated for the ordered procedure, I authorize the administration of contrast media per Radiology protocol    Answer:   Yes    Order Specific Question:   Preferred imaging location?    Answer:   [facility]    Order Specific Question:   Radiology Contrast Protocol - do NOT remove file path    Answer:   _0 [email or web address]    Order Specific Question:   ** REASON FOR EXAM (FREE TEXT)    Answer:   kidney cancer  . CBC with Differential    Standing Status:   Future    Standing  Expiration Date:   03/15/2020  . Basic metabolic panel    Standing Status:   Future    Standing Expiration Date:   03/15/2020   All questions were answered. The patient knows to call the clinic with any problems, questions or concerns.      [place] Sickle, MD 03/16/2019 9:47 AM

## 2019-03-16 NOTE — Progress Notes (Signed)
Patient here for follow up. Pt has nitroglycerin on medication list and states that he has never used it, but has lost bottle and is requesting a refill to have on hand in case he needs.

## 2019-03-16 NOTE — Assessment & Plan Note (Addendum)
#   Left kidney cancer metastatic to the lung-  Jan 9th CT- progression of lung/ bone lesions; currently on Ipi [1]+ nivo [3] q 3 W; clinically stable.   #  proceed with cycle #4 today. Labs today reviewed;  acceptable for treatment. Will order imaging today.   # Left LE fracture-likely pathologic; sent message to Dr.Bowers/ Otho. ? Will discuss re: Re-Irradiation.   # Left shoulder pain/ left leg pain- not well controlled;  Increase fenatnyl patch every 3 days to 75 mcg; hydrocodone as needed. New scripts given.  Continue [place] Hard 4/27].  # DISPOSITION: # treatment today;  # Follow up in 2 weeks-MD; lab- cbc/bmp; CT scan prior: no treatment=Dr.B

## 2019-03-20 DIAGNOSIS — J449 Chronic obstructive pulmonary disease, unspecified: Secondary | ICD-10-CM | POA: Diagnosis not present

## 2019-03-20 DIAGNOSIS — R05 Cough: Secondary | ICD-10-CM | POA: Diagnosis not present

## 2019-03-20 DIAGNOSIS — C642 Malignant neoplasm of left kidney, except renal pelvis: Secondary | ICD-10-CM | POA: Diagnosis not present

## 2019-03-20 DIAGNOSIS — R062 Wheezing: Secondary | ICD-10-CM | POA: Diagnosis not present

## 2019-03-24 DIAGNOSIS — S82425A Nondisplaced transverse fracture of shaft of left fibula, initial encounter for closed fracture: Secondary | ICD-10-CM | POA: Diagnosis not present

## 2019-03-30 ENCOUNTER — Other Ambulatory Visit: Payer: Self-pay

## 2019-03-30 ENCOUNTER — Ambulatory Visit
Admission: RE | Admit: 2019-03-30 | Discharge: 2019-03-30 | Disposition: A | Discharge status: Home / Self Care | Payer: PPO | Source: Ambulatory Visit | Attending: Internal Medicine | Admitting: Internal Medicine

## 2019-03-30 ENCOUNTER — Ambulatory Visit: Admission: RE | Admit: 2019-03-30 | Payer: PPO | Source: Ambulatory Visit

## 2019-03-30 DIAGNOSIS — C642 Malignant neoplasm of left kidney, except renal pelvis: Secondary | ICD-10-CM | POA: Insufficient documentation

## 2019-03-30 DIAGNOSIS — C7800 Secondary malignant neoplasm of unspecified lung: Secondary | ICD-10-CM | POA: Diagnosis not present

## 2019-03-30 DIAGNOSIS — C7951 Secondary malignant neoplasm of bone: Secondary | ICD-10-CM | POA: Diagnosis not present

## 2019-03-30 MED ORDER — IOHEXOL 300 MG/ML  SOLN
100.0000 mL | Freq: Once | INTRAMUSCULAR | Status: AC | PRN
  Administered 2019-03-30: 100 mL via INTRAVENOUS

## 2019-03-31 ENCOUNTER — Other Ambulatory Visit: Payer: Self-pay

## 2019-03-31 ENCOUNTER — Ambulatory Visit: Payer: PPO | Admitting: General Surgery

## 2019-04-01 ENCOUNTER — Ambulatory Visit
Admission: RE | Admit: 2019-04-01 | Discharge: 2019-04-01 | Disposition: A | Discharge status: Home / Self Care | Payer: PPO | Source: Ambulatory Visit | Attending: Internal Medicine | Admitting: Internal Medicine

## 2019-04-01 ENCOUNTER — Other Ambulatory Visit: Payer: Self-pay

## 2019-04-01 ENCOUNTER — Encounter: Payer: Self-pay | Admitting: Internal Medicine

## 2019-04-01 ENCOUNTER — Inpatient Hospital Stay (HOSPITAL_BASED_OUTPATIENT_CLINIC_OR_DEPARTMENT_OTHER): Payer: PPO | Admitting: Internal Medicine

## 2019-04-01 ENCOUNTER — Inpatient Hospital Stay: Payer: PPO | Attending: Internal Medicine

## 2019-04-01 VITALS — BP 121/74 | HR 74 | Temp 97.1°F | Resp 22 | Ht 73.0 in | Wt 175.6 lb

## 2019-04-01 DIAGNOSIS — Z87442 Personal history of urinary calculi: Secondary | ICD-10-CM | POA: Insufficient documentation

## 2019-04-01 DIAGNOSIS — C7951 Secondary malignant neoplasm of bone: Secondary | ICD-10-CM

## 2019-04-01 DIAGNOSIS — E041 Nontoxic single thyroid nodule: Secondary | ICD-10-CM

## 2019-04-01 DIAGNOSIS — C642 Malignant neoplasm of left kidney, except renal pelvis: Secondary | ICD-10-CM

## 2019-04-01 DIAGNOSIS — R918 Other nonspecific abnormal finding of lung field: Secondary | ICD-10-CM

## 2019-04-01 DIAGNOSIS — Z7982 Long term (current) use of aspirin: Secondary | ICD-10-CM

## 2019-04-01 DIAGNOSIS — Z79899 Other long term (current) drug therapy: Secondary | ICD-10-CM | POA: Insufficient documentation

## 2019-04-01 DIAGNOSIS — Z5111 Encounter for antineoplastic chemotherapy: Secondary | ICD-10-CM | POA: Diagnosis not present

## 2019-04-01 DIAGNOSIS — Z85820 Personal history of malignant melanoma of skin: Secondary | ICD-10-CM | POA: Insufficient documentation

## 2019-04-01 DIAGNOSIS — J449 Chronic obstructive pulmonary disease, unspecified: Secondary | ICD-10-CM | POA: Diagnosis not present

## 2019-04-01 DIAGNOSIS — Z7984 Long term (current) use of oral hypoglycemic drugs: Secondary | ICD-10-CM

## 2019-04-01 DIAGNOSIS — I10 Essential (primary) hypertension: Secondary | ICD-10-CM | POA: Insufficient documentation

## 2019-04-01 DIAGNOSIS — Z87891 Personal history of nicotine dependence: Secondary | ICD-10-CM | POA: Diagnosis not present

## 2019-04-01 DIAGNOSIS — I2510 Atherosclerotic heart disease of native coronary artery without angina pectoris: Secondary | ICD-10-CM

## 2019-04-01 LAB — CBC WITH DIFFERENTIAL/PLATELET
Abs Immature Granulocytes: 0 10*3/uL (ref 0.00–0.07)
Basophils Absolute: 0 10*3/uL (ref 0.0–0.1)
Basophils Relative: 1 %
Eosinophils Absolute: 0.2 10*3/uL (ref 0.0–0.5)
Eosinophils Relative: 5 %
HCT: 34.3 % — ABNORMAL LOW (ref 39.0–52.0)
Hemoglobin: 11.3 g/dL — ABNORMAL LOW (ref 13.0–17.0)
Immature Granulocytes: 0 %
Lymphocytes Relative: 14 %
Lymphs Abs: 0.6 10*3/uL — ABNORMAL LOW (ref 0.7–4.0)
MCH: 29.6 pg (ref 26.0–34.0)
MCHC: 32.9 g/dL (ref 30.0–36.0)
MCV: 89.8 fL (ref 80.0–100.0)
Monocytes Absolute: 0.5 10*3/uL (ref 0.1–1.0)
Monocytes Relative: 11 %
Neutro Abs: 3 10*3/uL (ref 1.7–7.7)
Neutrophils Relative %: 69 %
Platelets: 121 10*3/uL — ABNORMAL LOW (ref 150–400)
RBC: 3.82 MIL/uL — ABNORMAL LOW (ref 4.22–5.81)
RDW: 15 % (ref 11.5–15.5)
WBC: 4.4 10*3/uL (ref 4.0–10.5)
nRBC: 0 % (ref 0.0–0.2)

## 2019-04-01 LAB — BASIC METABOLIC PANEL
Anion gap: 7 (ref 5–15)
BUN: 11 mg/dL (ref 8–23)
CO2: 26 mmol/L (ref 22–32)
Calcium: 8.5 mg/dL — ABNORMAL LOW (ref 8.9–10.3)
Chloride: 105 mmol/L (ref 98–111)
Creatinine, Ser: 1.13 mg/dL (ref 0.61–1.24)
GFR calc Af Amer: >60 mL/min (ref >60)
GFR calc non Af Amer: >60 mL/min (ref >60)
Glucose, Bld: 97 mg/dL (ref 70–99)
Potassium: 3.6 mmol/L (ref 3.5–5.1)
Sodium: 138 mmol/L (ref 135–145)

## 2019-04-01 NOTE — Progress Notes (Signed)
[facility] OFFICE PROGRESS NOTE  Patient Care Team: Dion Body, MD as PCP - General (Family Medicine) Jannet Mantis, MD (Dermatology) Bary Castilla, Forest Gleason, MD (General Surgery) Hollice Espy, MD as Consulting Physician (Urology) Cammie Sickle, MD as Medical Oncologist (Medical Oncology) Isaias Cowman, MD as Consulting Physician (Cardiology) Harriette Bouillon, MD as Referring Physician (Cardiology)  Cancer Staging No matching staging information was found for the patient.   Oncology History   # MAY- June 2017- METASTATIC CLEAR CELL; LEFT RENAL CA/STAGE IV; [place]- G-3; INTERMEDIATE RISK;  [bil Pul lung nodules;incidental s/p Bx Dr.Byrnett/Dr.Oaks] s/p Cytoreductive nephrectomy; Dr.Brandon- [id number]; July 11th 2017 CT- Enlarging Lung nodules  # Aug 7th 2017- PAZOPANIB; STOP SEP 2017 [pancreatitis/poor tol]  # OCT 1 week- START NIVO q 2W x4; DEC 8th CT- "Progression"; Continue Opdivo;APRIL-MAY 2018- STABLE LUNG NODULES [stopped sec to severe cough; NO Pneumonitis]  # May 31st 2018- Cabo 76m/day; July 31st CT lung- improved lung nodules; Sep 1st week- cabo- 446mday [dose reduced sec to mult intol]; Oct 1st 2week-Cabo 2022may;   # NOV 15th 2018- CT chest- slight progression of lung nodules [poor tol to higer doses];   # Nov 28th 2018- SUTENT 50 mg 2w-on  &1 w-OFF;  # MARCH 2019- Progression; start Axitinib 5 mg BID; July 2019- MIXED response; worsening/new skeletal lesions/stable to improved lung lesions; STOP Axitinib;   # Left hip s/p RT [mid-AUG 2019]  # AUG 12th- RE-START CABO 40 mg/day; STOP SEP 2019- sec to intol [abdominal pain even on 15m38my]; [facility] [place] [facility] [place] [facility] IV q W x 12 treatments; May 28, 2019-progression of the lung nodules bone lesions.  # Jan 2020- 13th-Lenvima 12 mg; disocn F3b 2020; clinical progression  # Feb 20th, 2020- IPI+NIVO  # March 2017-  Malignant melanoma of the  intrascapular area on the back ]right side];STAGE I  0.42 millimeter depth; s/p WLE [Dr.Byrnett]  ---------------------------------------------------------    # March 2019- Molecular testing- PDL-1 NEG; TMB-Low; MSI-STABLE; No targets; VHL mutation of  unknown significance  -----------------------------------------------------   # Dx; Kidney cancer/ clear cell STAGE IV  Current treatment: IPI+NIVO Goal: Palliative     [facility]   INTERVAL HISTORY:  DaviISIDORO SANTILLANAy48.  male pleasant patient above history of metastatic renal cell cancer currently on ipi+ nivo status post cycle 4 is here for follow-up/ review the results of CT scan.  Patient continues to have pain in his left shin at the site of surgery/tumor metastasis.  No new falls.  He continues to wear his fentanyl patch.  Nausea no vomiting.  Denies any diarrhea.  No chest pain or shortness breath or cough.  Review of Systems  Constitutional: Positive for malaise/fatigue. Negative for chills, diaphoresis and fever.  HENT: Negative for nosebleeds and sore throat.   Eyes: Negative for double vision.  Respiratory: Negative for cough, hemoptysis, sputum production, shortness of breath and wheezing.   Cardiovascular: Negative for chest pain, palpitations, orthopnea and leg swelling.  Gastrointestinal: Positive for constipation. Negative for blood in stool, heartburn, melena and vomiting.  Genitourinary: Negative for dysuria, frequency and urgency.  Musculoskeletal: Positive for back pain and joint pain (left shoulder pain).       In cast.  Skin: Negative.  Negative for itching and rash.  Neurological: Negative for tingling, focal weakness and weakness.  Endo/Heme/Allergies: Does not bruise/bleed easily.  Psychiatric/Behavioral: Negative for depression. The patient is  not nervous/anxious.       PAST MEDICAL HISTORY :  Past Medical History:  Diagnosis Date  . CAD (coronary  artery disease)   . Cancer ([place])    left arm  . COPD (chronic obstructive pulmonary disease) ([place])   . Hypertension   . Kidney stone   . Lung cancer ([place])   . Melanoma ([place]) 01/24/2016   right shoulder  . Thyroid nodule 02/02/2016   left BENIGN THYROID NODULE by FNA    PAST SURGICAL HISTORY :   Past Surgical History:  Procedure Laterality Date  . arm surgery Left    arm  . cardiac stents  2011   Angioplasty / Stenting Femoral-X2  . COLONOSCOPY  04/01/12  . CORONARY ANGIOPLASTY    . EXCISION MELANOMA WITH SENTINEL LYMPH NODE BIOPSY Right 01/24/2016   Procedure: EXCISION MELANOMA Right Shoulder;  Surgeon: Robert Bellow, MD;  Location: [facility] ORS;  Service: General;  Laterality: Right;  . LAPAROSCOPIC NEPHRECTOMY, HAND ASSISTED Left 04/24/2016   Procedure: HAND ASSISTED LAPAROSCOPIC NEPHRECTOMY;  Surgeon: Hollice Espy, MD;  Location: [facility] ORS;  Service: Urology;  Laterality: Left;  . TIBIA IM NAIL INSERTION Left 02/11/2019   Procedure: INTRAMEDULLARY (IM) NAIL TIBIAL;  Surgeon: Lovell Sheehan, MD;  Location: [facility] ORS;  Service: Orthopedics;  Laterality: Left;  Marland Kitchen VIDEO ASSISTED THORACOSCOPY (VATS)/THOROCOTOMY Left 03/08/2016   Procedure: VIDEO ASSISTED THORACOSCOPY (VATS) with lung biopsy - Left ;  Surgeon: Robert Bellow, MD;  Location: [facility] ORS;  Service: General;  Laterality: Left;    FAMILY HISTORY :   Family History  Problem Relation Age of Onset  . Hodgkin's lymphoma Daughter 5  . Heart attack Father   . Kidney cancer Neg Hx   . Kidney disease Neg Hx   . Prostate cancer Neg Hx     SOCIAL HISTORY:   Social History   Tobacco Use  . Smoking status: Former Smoker    Packs/day: 2.00    Years: 30.00    Pack years: 60.00    Types: Cigarettes    Start date: 01/18/1956    Last attempt to quit: 01/17/1969    Years since quitting: 50.2  . Smokeless tobacco: Never Used  Substance Use Topics  . Alcohol use: Not Currently    Alcohol/week: 0.0 standard drinks    Comment:  BEER OCC  . Drug use: No    ALLERGIES:  is allergic to lisinopril and other.  MEDICATIONS:  Current Outpatient Medications  Medication Sig Dispense Refill  . albuterol (PROVENTIL HFA;VENTOLIN HFA) 108 (90 Base) MCG/ACT inhaler Inhale 2 puffs into the lungs every 6 (six) hours as needed for wheezing or shortness of breath. 1 Inhaler 2  . aspirin EC 81 MG tablet Take 81 mg by mouth daily.    . Aspirin-Salicylamide-Caffeine (BC HEADACHE POWDER PO) Take 1 packet by mouth daily.    Marland Kitchen atorvastatin (LIPITOR) 40 MG tablet Take 1 tablet (40 mg total) by mouth daily. 30 tablet 3  . busPIRone (BUSPAR) 5 MG tablet Take 5 mg by mouth 2 (two) times daily.    . cholecalciferol (VITAMIN D) 400 units TABS tablet Take 400 Units by mouth daily.    . clopidogrel (PLAVIX) 75 MG tablet Take 75 mg by mouth daily.    . DULoxetine (CYMBALTA) 30 MG capsule Take 2 capsules by mouth daily.    . fentaNYL (DURAGESIC) 75 MCG/HR Place 1 patch onto the skin every 3 (three) days. 10 patch 0  . fluticasone (FLONASE) 50 MCG/ACT  nasal spray Place 2 sprays into both nostrils daily. 16 g 2  . HYDROcodone-acetaminophen (NORCO/VICODIN) 5-325 MG tablet Take 1 tablet by mouth every 6 (six) hours as needed for moderate pain. 60 tablet 0  . ipratropium-albuterol (DUONEB) 0.5-2.5 (3) MG/3ML SOLN Take 3 mLs by nebulization every 4 (four) hours as needed. 360 mL 3  . loratadine (CLARITIN) 10 MG tablet Take 10 mg by mouth daily.    Marland Kitchen MELATONIN PO Take 10 mg by mouth at bedtime.     . metFORMIN (GLUCOPHAGE) 500 MG tablet TAKE 1 TABLET (500 MG TOTAL) BY MOUTH 2 (TWO) TIMES DAILY WITH A MEAL. 60 tablet 3  . Misc Natural Products (GLUCOSAMINE CHOND COMPLEX/MSM) TABS Take 2 tablets by mouth daily.    . montelukast (SINGULAIR) 10 MG tablet TAKE 1 TABLET BY MOUTH EVERYDAY AT BEDTIME 90 tablet 1  . Omega-3 Fatty Acids (FISH OIL) 1000 MG CPDR Take 1 capsule by mouth daily.    Marland Kitchen oxyCODONE (OXY IR/ROXICODONE) 5 MG immediate release tablet Take 1  tablet (5 mg total) by mouth every 4 (four) hours as needed for moderate pain or severe pain. 30 tablet 0  . OXYGEN Inhale 2 L into the lungs at bedtime.    . tamsulosin (FLOMAX) 0.4 MG CAPS capsule TAKE 1 CAPSULE (0.4 MG TOTAL) BY MOUTH EVERY MORNING. 30 capsule 3  . temazepam (RESTORIL) 7.5 MG capsule Take 1 capsule (7.5 mg total) by mouth at bedtime as needed for sleep. 30 capsule 3  . Tiotropium Bromide Monohydrate (SPIRIVA RESPIMAT) 1.25 MCG/ACT AERS Inhale 1.25 Act into the lungs 2 (two) times daily. 1 Inhaler 11  . tiZANidine (ZANAFLEX) 4 MG tablet Take 4 mg by mouth daily.     . Turmeric 500 MG TABS Take 1 tablet by mouth daily.    . [employer] CITRATE OF MAGNESIA SOLN Take 15 mLs by mouth as needed for constipation.    Marland Kitchen loperamide (IMODIUM) 1 MG/5ML solution Take 4 mg by mouth as needed for diarrhea or loose stools.    . nitroGLYCERIN (NITROSTAT) 0.4 MG SL tablet Place 1 tablet (0.4 mg total) under the tongue every 5 (five) minutes as needed. (Patient not taking: Reported on 04/01/2019) 30 tablet 0   No current facility-administered medications for this visit.    Facility-Administered Medications Ordered in Other Visits  Medication Dose Route Frequency Provider Last Rate Last Dose  . 0.9 %  sodium chloride infusion   Intravenous Continuous Cammie Sickle, MD 10 mL/hr at 10/27/18 1054      PHYSICAL EXAMINATION: ECOG PERFORMANCE STATUS: 1 - Symptomatic but completely ambulatory  BP 121/74 (BP Location: Right Arm, Patient Position: Sitting, Cuff Size: Normal)   Pulse 74   Temp (!) 97.1 F (36.2 C) (Tympanic)   Resp (!) 22   Ht '6\' 1"'  (1.854 m)   Wt 175 lb 9.6 oz (79.7 kg)   BMI 23.17 kg/m   Filed Weights   04/01/19 0921  Weight: 175 lb 9.6 oz (79.7 kg)    Physical Exam  Constitutional: He is oriented to person, place, and time and well-developed, well-nourished, and in no distress.  Alone.  He is in a wheelchair.  HENT:  Head: Normocephalic and atraumatic.  Mouth/Throat:  Oropharynx is clear and moist. No oropharyngeal exudate.  Eyes: Pupils are equal, round, and reactive to light.  Neck: Normal range of motion. Neck supple.  Cardiovascular: Normal rate and regular rhythm.  Pulmonary/Chest: No respiratory distress. He has no wheezes.  Abdominal: Soft. Bowel  sounds are normal. He exhibits no distension and no mass. There is no guarding.  Musculoskeletal:        General: No tenderness or edema.     Comments: In cast.  Neurological: He is alert and oriented to person, place, and time.  Skin: Skin is warm.  Psychiatric: Affect normal.       LABORATORY DATA:  I have reviewed the data as listed    Component Value Date/Time   NA 138 04/01/2019 0850   NA 146 (H) 05/25/2016 1514   K 3.6 04/01/2019 0850   CL 105 04/01/2019 0850   CO2 26 04/01/2019 0850   GLUCOSE 97 04/01/2019 0850   BUN 11 04/01/2019 0850   BUN 19 05/25/2016 1514   CREATININE 1.13 04/01/2019 0850   CALCIUM 8.5 (L) 04/01/2019 0850   PROT 6.9 03/16/2019 0840   ALBUMIN 3.9 03/16/2019 0840   AST 16 03/16/2019 0840   ALT 13 03/16/2019 0840   ALKPHOS 104 03/16/2019 0840   BILITOT 0.5 03/16/2019 0840   GFRNONAA >60 04/01/2019 0850   GFRAA >60 04/01/2019 0850    No results found for: SPEP, UPEP  Lab Results  Component Value Date   WBC 4.4 04/01/2019   NEUTROABS 3.0 04/01/2019   HGB 11.3 (L) 04/01/2019   HCT 34.3 (L) 04/01/2019   MCV 89.8 04/01/2019   PLT 121 (L) 04/01/2019      Chemistry      Component Value Date/Time   NA 138 04/01/2019 0850   NA 146 (H) 05/25/2016 1514   K 3.6 04/01/2019 0850   CL 105 04/01/2019 0850   CO2 26 04/01/2019 0850   BUN 11 04/01/2019 0850   BUN 19 05/25/2016 1514   CREATININE 1.13 04/01/2019 0850      Component Value Date/Time   CALCIUM 8.5 (L) 04/01/2019 0850   ALKPHOS 104 03/16/2019 0840   AST 16 03/16/2019 0840   ALT 13 03/16/2019 0840   BILITOT 0.5 03/16/2019 0840       RADIOGRAPHIC STUDIES: I have personally reviewed the  radiological images as listed and agreed with the findings in the report. No results found.   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  No new metastases or liver mets.  # Proceed with cycle #5 next week.  Labs today reviewed;  acceptable for treatment.  I would recommend total of 6 cycles of IPI+ NIVO combination; followed by Opdivo maintenance.  # I had a long discussion with the patient family regarding rationale for continued therapy-given the CT scan shows mild progression [no further good systemic therapy options available/patient tolerating treatment well/possibility of pseudo-progression/poor tolerance for therapies].  I would recommend repeat imaging in 2 to 3 months.   It was  also discussed that if  he has continued disease on imaging or on clinical basis/or poor tolerance of therapy-hospice would be recommended.  # Left LE fracture-likely pathologic; sent message to Dr.Bowers/ Otho. ? Will discuss re: Re-Irradiation.   # Left shoulder pain/ left leg pain- not well controlled; continue fenatnyl patch every 3 days to 75 mcg; hydrocodone as needed. Continue [place] Hard 4/27]. Refer to Dr.Crystal. check bone survey.  Discussed with Dr. Donella Stade.  # DISPOSITION: # bone x-rays today #  refer to Dr.Cystal asap for  RT to leg # in 1 week-abs- cbc/cmp- IPI+ NIVO; X-geva; # June 15th-MD; lab- cbc/cmp/TSH; IPI+ NIVO-Dr.B  # I reviewed the blood work- with the patient in detail; also reviewed the imaging independently [as summarized above]; and with the patient in detail.   # 40 minutes face-to-face with the patient discussing the above plan of care; more than 50% of time spent on prognosis/ natural history; counseling and  coordination.    Orders Placed This Encounter  Procedures  . DG Bone Survey Met    Standing Status:   Future    Number of Occurrences:   1    Standing Expiration Date:   05/31/2020    Order Specific Question:   Reason for Exam (SYMPTOM  OR DIAGNOSIS REQUIRED)    Answer:   multiple myeloma    Order Specific Question:   Preferred imaging location?    Answer:   [facility]  . TSH    Standing Status:   Future    Standing Expiration Date:   03/31/2020   All questions were answered. The patient knows to call the clinic with any problems, questions or concerns.      Cammie Sickle, MD 04/01/2019 12:09 PM

## 2019-04-01 NOTE — Assessment & Plan Note (Addendum)
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  No new metastases or liver mets.  # Proceed with cycle #5 next week.  Labs today reviewed;  acceptable for treatment.  I would recommend total of 6 cycles of IPI+ NIVO combination; followed by Opdivo maintenance.  # I had a long discussion with the patient family regarding rationale for continued therapy-given the CT scan shows mild progression [no further good systemic therapy options available/patient tolerating treatment well/possibility of pseudo-progression/poor tolerance for therapies].  I would recommend repeat imaging in 2 to 3 months.   It was  also discussed that if  he has continued disease on imaging or on clinical basis/or poor tolerance of therapy-hospice would be recommended.  # Left LE fracture-likely pathologic; sent message to Dr.Bowers/ Otho. ? Will discuss re: Re-Irradiation.   # Left shoulder pain/ left leg pain- not well controlled; continue fenatnyl patch every 3 days to 75 mcg; hydrocodone as needed. Continue [place] Hard 4/27]. Refer to Dr.Crystal. check bone survey.  Discussed with Dr. Donella Stade.  # DISPOSITION: # bone x-rays today # refer to Dr.Cystal asap for  RT to leg # in 1 week-abs- cbc/cmp- IPI+ NIVO; X-geva; # June 15th-MD; lab- cbc/cmp/TSH; IPI+ NIVO-Dr.B  # I reviewed the blood work- with the patient in detail; also reviewed the imaging independently [as summarized above]; and with the patient in detail.   # 40 minutes face-to-face with the patient discussing the above plan of care; more than 50% of time spent on prognosis/ natural history; counseling and coordination.

## 2019-04-03 ENCOUNTER — Ambulatory Visit
Admission: RE | Admit: 2019-04-03 | Discharge: 2019-04-03 | Disposition: A | Discharge status: Home / Self Care | Payer: PPO | Source: Ambulatory Visit | Attending: Radiation Oncology | Admitting: Radiation Oncology

## 2019-04-03 ENCOUNTER — Other Ambulatory Visit: Payer: Self-pay | Admitting: *Deleted

## 2019-04-03 ENCOUNTER — Other Ambulatory Visit: Payer: Self-pay

## 2019-04-03 ENCOUNTER — Encounter: Payer: Self-pay | Admitting: Radiation Oncology

## 2019-04-03 VITALS — BP 115/72 | HR 80 | Temp 98.2°F | Resp 18

## 2019-04-03 DIAGNOSIS — Z923 Personal history of irradiation: Secondary | ICD-10-CM | POA: Insufficient documentation

## 2019-04-03 DIAGNOSIS — C7951 Secondary malignant neoplasm of bone: Secondary | ICD-10-CM | POA: Diagnosis not present

## 2019-04-03 DIAGNOSIS — G893 Neoplasm related pain (acute) (chronic): Secondary | ICD-10-CM | POA: Insufficient documentation

## 2019-04-03 DIAGNOSIS — C649 Malignant neoplasm of unspecified kidney, except renal pelvis: Secondary | ICD-10-CM | POA: Diagnosis not present

## 2019-04-03 NOTE — Progress Notes (Signed)
Radiation Oncology Follow up Note old patient new area persistent pain in left shoulder  Name: Glen Davis   Date:   04/03/2019 MRN:  [id number] DOB: 24-Jul-1941    This 78 y.o. male presents to the clinic today for evaluation of persistent pain in left shoulder with patient with known metastatic renal cell carcinoma previously treated to his L-spine left lower extremity and left shoulder x2.  REFERRING PROVIDER: Dion Body, MD  HPI: Patient is a.  78 year old male well-known to our department having received multiple courses of palliative radiation therapy to his left shoulder as well as his lumbar spine left iliac crest and left lower extremity for stage IV metastatic renal cell carcinoma.  He is currently on narcotic analgesics although pain persists mostly in the left shoulder region.  He does appear to have a possible pathologic fracture in this region for metastatic disease.  Patient has had tib-fib fracture of the left secondary to metastatic disease in area previously irradiated.  He does have some tenderness in this area although his main pain is in his left shoulder.  He states the pain is not well controlled with his current regimen of narcotics.  Patient does have multiple bone as well as lung metastasis.  He is currently on currently on Ipi [1]+ nivo [0] q 3 W  COMPLICATIONS OF TREATMENT: none  FOLLOW UP COMPLIANCE: keeps appointments   PHYSICAL EXAM:  BP 115/72 (BP Location: Right Arm, Patient Position: Sitting)   Pulse 80   Temp 98.2 F (36.8 C) (Tympanic)   Resp 18  Range of motion of his left upper extremity does elicit some pain.  No pain is elicited on deep palpation of his left lower extremity.  Well-developed well-nourished patient in NAD. HEENT reveals PERLA, EOMI, discs not visualized.  Oral cavity is clear. No oral mucosal lesions are identified. Neck is clear without evidence of cervical or supraclavicular adenopathy. Lungs are clear to A&P. Cardiac examination  is essentially unremarkable with regular rate and rhythm without murmur rub or thrill. Abdomen is benign with no organomegaly or masses noted. Motor sensory and DTR levels are equal and symmetric in the upper and lower extremities. Cranial nerves II through XII are grossly intact. Proprioception is intact. No peripheral adenopathy or edema is identified. No motor or sensory levels are noted. Crude visual fields are within normal range.  RADIOLOGY RESULTS: Recent plain films and CT scans are reviewed compatible with above-stated findings showing multiple areas of bony metastasis.  I have ordered an MRI of his left shoulder  PLAN: At this time of ordered an MRI scan of his left shoulder.  Should this be a pathologic fracture not much radiation can do since of already treated this area twice.  Patient may benefit from rehab to the shoulder depending on results of MRI scan.  I have set up a follow-up appointment shortly after his MRI scan.  Do not see a role at this time to treat his left lower extremity his pain in this region seems more postsurgical then progressive bone metastasis.  Patient comprehends my treatment plan well.  I would like to take this opportunity to thank you for allowing me to participate in the care of your patient.Noreene Filbert, MD

## 2019-04-04 ENCOUNTER — Other Ambulatory Visit: Payer: Self-pay | Admitting: Internal Medicine

## 2019-04-07 ENCOUNTER — Other Ambulatory Visit: Payer: Self-pay

## 2019-04-08 ENCOUNTER — Inpatient Hospital Stay: Payer: PPO

## 2019-04-08 ENCOUNTER — Other Ambulatory Visit: Payer: Self-pay

## 2019-04-08 VITALS — BP 104/61 | HR 85 | Temp 96.5°F | Resp 16 | Wt 176.6 lb

## 2019-04-08 DIAGNOSIS — C642 Malignant neoplasm of left kidney, except renal pelvis: Secondary | ICD-10-CM

## 2019-04-08 DIAGNOSIS — Z5111 Encounter for antineoplastic chemotherapy: Secondary | ICD-10-CM | POA: Diagnosis not present

## 2019-04-08 DIAGNOSIS — C7951 Secondary malignant neoplasm of bone: Secondary | ICD-10-CM

## 2019-04-08 DIAGNOSIS — Z7189 Other specified counseling: Secondary | ICD-10-CM

## 2019-04-08 LAB — CBC WITH DIFFERENTIAL/PLATELET
Abs Immature Granulocytes: 0.02 10*3/uL (ref 0.00–0.07)
Basophils Absolute: 0 10*3/uL (ref 0.0–0.1)
Basophils Relative: 1 %
Eosinophils Absolute: 0.2 10*3/uL (ref 0.0–0.5)
Eosinophils Relative: 4 %
HCT: 38.5 % — ABNORMAL LOW (ref 39.0–52.0)
Hemoglobin: 12.5 g/dL — ABNORMAL LOW (ref 13.0–17.0)
Immature Granulocytes: 0 %
Lymphocytes Relative: 13 %
Lymphs Abs: 0.7 10*3/uL (ref 0.7–4.0)
MCH: 29.3 pg (ref 26.0–34.0)
MCHC: 32.5 g/dL (ref 30.0–36.0)
MCV: 90.4 fL (ref 80.0–100.0)
Monocytes Absolute: 0.4 10*3/uL (ref 0.1–1.0)
Monocytes Relative: 8 %
Neutro Abs: 4 10*3/uL (ref 1.7–7.7)
Neutrophils Relative %: 74 %
Platelets: 140 10*3/uL — ABNORMAL LOW (ref 150–400)
RBC: 4.26 MIL/uL (ref 4.22–5.81)
RDW: 14.6 % (ref 11.5–15.5)
WBC: 5.3 10*3/uL (ref 4.0–10.5)
nRBC: 0 % (ref 0.0–0.2)

## 2019-04-08 LAB — COMPREHENSIVE METABOLIC PANEL
ALT: 10 U/L (ref 0–44)
AST: 16 U/L (ref 15–41)
Albumin: 4.1 g/dL (ref 3.5–5.0)
Alkaline Phosphatase: 77 U/L (ref 38–126)
Anion gap: 8 (ref 5–15)
BUN: 14 mg/dL (ref 8–23)
CO2: 26 mmol/L (ref 22–32)
Calcium: 9.3 mg/dL (ref 8.9–10.3)
Chloride: 104 mmol/L (ref 98–111)
Creatinine, Ser: 1.12 mg/dL (ref 0.61–1.24)
GFR calc Af Amer: >60 mL/min (ref >60)
GFR calc non Af Amer: >60 mL/min (ref >60)
Glucose, Bld: 159 mg/dL — ABNORMAL HIGH (ref 70–99)
Potassium: 3.9 mmol/L (ref 3.5–5.1)
Sodium: 138 mmol/L (ref 135–145)
Total Bilirubin: 0.5 mg/dL (ref 0.3–1.2)
Total Protein: 7 g/dL (ref 6.5–8.1)

## 2019-04-08 LAB — TSH: TSH: 12.883 u[IU]/mL — ABNORMAL HIGH (ref 0.350–4.500)

## 2019-04-08 MED ORDER — SODIUM CHLORIDE 0.9 % IV SOLN
240.0000 mg | Freq: Once | INTRAVENOUS | Status: AC
  Administered 2019-04-08: 240 mg via INTRAVENOUS
  Filled 2019-04-08: qty 24

## 2019-04-08 MED ORDER — SODIUM CHLORIDE 0.9 % IV SOLN
Freq: Once | INTRAVENOUS | Status: AC
  Administered 2019-04-08: 10:00:00 via INTRAVENOUS
  Filled 2019-04-08: qty 250

## 2019-04-08 MED ORDER — SODIUM CHLORIDE 0.9 % IV SOLN
1.0000 mg/kg | Freq: Once | INTRAVENOUS | Status: AC
  Administered 2019-04-08: 12:00:00 85 mg via INTRAVENOUS
  Filled 2019-04-08: qty 17

## 2019-04-08 MED ORDER — FAMOTIDINE IN NACL 20-0.9 MG/50ML-% IV SOLN
20.0000 mg | Freq: Once | INTRAVENOUS | Status: AC
  Administered 2019-04-08: 20 mg via INTRAVENOUS
  Filled 2019-04-08: qty 50

## 2019-04-08 MED ORDER — DIPHENHYDRAMINE HCL 50 MG/ML IJ SOLN
25.0000 mg | Freq: Once | INTRAMUSCULAR | Status: AC
  Administered 2019-04-08: 25 mg via INTRAVENOUS
  Filled 2019-04-08: qty 1

## 2019-04-08 NOTE — Progress Notes (Signed)
Xgeva last given on 03/16/2019. MD, Dr. Rogue Bussing, notified and aware. Per MD order: hold/do not give Xgeva injection today.

## 2019-04-09 ENCOUNTER — Other Ambulatory Visit: Payer: Self-pay | Admitting: Internal Medicine

## 2019-04-09 ENCOUNTER — Telehealth: Payer: Self-pay | Admitting: *Deleted

## 2019-04-09 MED ORDER — LEVOTHYROXINE SODIUM 75 MCG PO TABS: 75.0000 ug | ORAL_TABLET | Freq: Every day | ORAL | 3 refills | Status: AC

## 2019-04-09 NOTE — Addendum Note (Signed)
Addended by: Cammie Sickle on: 04/09/2019 10:32 AM   Modules accepted: Orders

## 2019-04-09 NOTE — Telephone Encounter (Signed)
Spoke with patient and his wife. Gave verbal understanding to pick up new script.

## 2019-04-09 NOTE — Telephone Encounter (Signed)
-----   Message from Cammie Sickle, MD sent at 04/09/2019 10:41 AM EDT ----- Nira Conn please inform patient's wife-that his thyroid numbers are abnormal from his immunotherapy; recommend Synthroid 75 mcg once a day.  I will send a new prescription to his pharmacy.

## 2019-04-10 ENCOUNTER — Encounter: Payer: Self-pay | Admitting: Nurse Practitioner

## 2019-04-10 ENCOUNTER — Other Ambulatory Visit: Payer: Self-pay

## 2019-04-10 ENCOUNTER — Telehealth: Payer: Self-pay | Admitting: *Deleted

## 2019-04-10 ENCOUNTER — Inpatient Hospital Stay (HOSPITAL_BASED_OUTPATIENT_CLINIC_OR_DEPARTMENT_OTHER): Payer: PPO | Admitting: Nurse Practitioner

## 2019-04-10 ENCOUNTER — Other Ambulatory Visit: Payer: Self-pay | Admitting: *Deleted

## 2019-04-10 DIAGNOSIS — C7951 Secondary malignant neoplasm of bone: Secondary | ICD-10-CM | POA: Diagnosis not present

## 2019-04-10 DIAGNOSIS — C642 Malignant neoplasm of left kidney, except renal pelvis: Secondary | ICD-10-CM | POA: Diagnosis not present

## 2019-04-10 DIAGNOSIS — G893 Neoplasm related pain (acute) (chronic): Secondary | ICD-10-CM | POA: Diagnosis not present

## 2019-04-10 MED ORDER — OXYCODONE HCL 10 MG PO TABS: 10.0000 mg | ORAL_TABLET | ORAL | 0 refills | Status: AC | PRN

## 2019-04-10 NOTE — Progress Notes (Signed)
   Symptom Management [place]  [phone] [phone]   Virtual Visit via Telephone Note   I connected with Glen Davis 04/10/19 at 2:00 PM EST by telephone visit and verified that I am speaking with the correct person using two identifiers.   I discussed the limitations, risks, security and privacy concerns of performing an evaluation and management service by telemedicine and the availability of in-person appointments. I also discussed with the patient that there may be a patient responsible charge related to this service. The patient expressed understanding and agreed to proceed.   Other persons participating in the visit and their role in the encounter: Glen Davis (patient's wife)  Patient's location: home  Provider's location: home  Chief Complaint: pain  Patient agreed to evaluation by telephone/telemedicine to discuss pain uncontrolled by current regimen.     History of Present Illness: Glen Davis is a 78 y.o. male, with history of metastatic renal cell carcinoma who presents to [facility] with reports of pain in left shoulder and left shin that is uncontrolled by current regimen of fentanyl patch 75 mcg q72h and oxycodone 10 mg q6h for breakthrough. He says pain persists and doesn't feel pain medications have made much difference. He met with Dr. Baruch Gouty on 04/03/2019 for evaluation and consideration of palliative radiation. He rates pain 7/10. No new falls or known trauma. Moves bowels daily. Previously took hydrocodone and didn't feel it improved his pain significantly.    Observations/Objective: Exam limited due to telephone visit. Patient speaks fluidly, well oriented. No obvious acute distress. His wife participates in the visit and contributes to the HPI.    Assessment and Plan: Glen Davis, 78 year old male diagnosed with left kidney cancer metastatic to lung, currently on Ipi+Novo with plan for 6 total  cycles followed by Opdivo maintenance. Treatment given with palliative intent. Plan to re-image in 2-3 months. Given that he has had a poor tolerance to prior therapies and possibility of progression on previous imaging hospice was discussed if progressive disease.   Pain Associated with Metastatic Disease- increase oxycodone 10-'20mg'$  q4h prn for breakthrough pain. I will have him follow up with Billey Chang, NP next week to re-evaluate his pain. Follow up with Dr. Baruch Gouty for palliative radiation to shoulder. Consider increasing fentanyl patch to 100 mcg if persistent pain.   Opioid induced constipation- Extensive discussion, including the need to not fall behind on constipation management and the invariable need for regular stimulant laxative in the setting of high-dose opioids. Goal of 1 BM every to every other day without pain or straining. Continue senna and miralax. Fluids as able. Physical activity as tolerated.    Follow Up Instructions: rtc on 5/27 to follow up with Billey Chang, NP regarding pain.    I discussed the assessment and treatment plan with the patient. The patient was provided an opportunity to ask questions and all were answered. The patient agreed with the plan and demonstrated an understanding of the instructions.   The patient was advised to call back or seek an in-person evaluation if the symptoms worsen or if the condition fails to improve as anticipated.   I provided 21 minutes of non face-to-face telephone visit time during this encounter, and > 50% was spent counseling as documented under my assessment & plan.   Beckey Rutter, DNP, AGNP-[facility] [place] (work cell) [phone] (office)  CC: Dr. Forestine Chute, NP

## 2019-04-10 NOTE — Telephone Encounter (Signed)
Patient's wife called. Reporting increasing pain in the left shoulder and the left leg. Patient is almost out of oxycodone 5 mg. The last dose taken at 1030 am. Not helping his pain. He is also taking fentanyl patch 75 mcg.

## 2019-04-10 NOTE — Telephone Encounter (Signed)
Lauren will have a telephone visit with the patient via the wife's cell phone

## 2019-04-15 ENCOUNTER — Other Ambulatory Visit: Payer: Self-pay

## 2019-04-15 ENCOUNTER — Ambulatory Visit
Admission: RE | Admit: 2019-04-15 | Discharge: 2019-04-15 | Disposition: A | Discharge status: Home / Self Care | Payer: PPO | Source: Ambulatory Visit | Attending: Radiation Oncology | Admitting: Radiation Oncology

## 2019-04-15 DIAGNOSIS — K259 Gastric ulcer, unspecified as acute or chronic, without hemorrhage or perforation: Secondary | ICD-10-CM | POA: Diagnosis not present

## 2019-04-15 DIAGNOSIS — C7889 Secondary malignant neoplasm of other digestive organs: Secondary | ICD-10-CM | POA: Diagnosis not present

## 2019-04-15 DIAGNOSIS — I491 Atrial premature depolarization: Secondary | ICD-10-CM | POA: Diagnosis not present

## 2019-04-15 DIAGNOSIS — C642 Malignant neoplasm of left kidney, except renal pelvis: Secondary | ICD-10-CM | POA: Diagnosis not present

## 2019-04-15 DIAGNOSIS — R0602 Shortness of breath: Secondary | ICD-10-CM | POA: Diagnosis not present

## 2019-04-15 DIAGNOSIS — K922 Gastrointestinal hemorrhage, unspecified: Secondary | ICD-10-CM | POA: Diagnosis present

## 2019-04-15 DIAGNOSIS — Z209 Contact with and (suspected) exposure to unspecified communicable disease: Secondary | ICD-10-CM | POA: Diagnosis not present

## 2019-04-15 DIAGNOSIS — D509 Iron deficiency anemia, unspecified: Secondary | ICD-10-CM | POA: Diagnosis not present

## 2019-04-15 DIAGNOSIS — I2510 Atherosclerotic heart disease of native coronary artery without angina pectoris: Secondary | ICD-10-CM | POA: Diagnosis present

## 2019-04-15 DIAGNOSIS — Z7901 Long term (current) use of anticoagulants: Secondary | ICD-10-CM | POA: Diagnosis not present

## 2019-04-15 DIAGNOSIS — C7951 Secondary malignant neoplasm of bone: Secondary | ICD-10-CM | POA: Diagnosis present

## 2019-04-15 DIAGNOSIS — Z87442 Personal history of urinary calculi: Secondary | ICD-10-CM | POA: Diagnosis not present

## 2019-04-15 DIAGNOSIS — Z85820 Personal history of malignant melanoma of skin: Secondary | ICD-10-CM | POA: Diagnosis not present

## 2019-04-15 DIAGNOSIS — G9341 Metabolic encephalopathy: Secondary | ICD-10-CM | POA: Diagnosis not present

## 2019-04-15 DIAGNOSIS — Z7982 Long term (current) use of aspirin: Secondary | ICD-10-CM | POA: Diagnosis not present

## 2019-04-15 DIAGNOSIS — E039 Hypothyroidism, unspecified: Secondary | ICD-10-CM | POA: Diagnosis present

## 2019-04-15 DIAGNOSIS — G92 Toxic encephalopathy: Secondary | ICD-10-CM | POA: Diagnosis present

## 2019-04-15 DIAGNOSIS — E041 Nontoxic single thyroid nodule: Secondary | ICD-10-CM | POA: Diagnosis present

## 2019-04-15 DIAGNOSIS — E785 Hyperlipidemia, unspecified: Secondary | ICD-10-CM | POA: Diagnosis present

## 2019-04-15 DIAGNOSIS — I10 Essential (primary) hypertension: Secondary | ICD-10-CM | POA: Diagnosis present

## 2019-04-15 DIAGNOSIS — R062 Wheezing: Secondary | ICD-10-CM | POA: Diagnosis not present

## 2019-04-15 DIAGNOSIS — G8929 Other chronic pain: Secondary | ICD-10-CM | POA: Diagnosis present

## 2019-04-15 DIAGNOSIS — D62 Acute posthemorrhagic anemia: Secondary | ICD-10-CM | POA: Diagnosis present

## 2019-04-15 DIAGNOSIS — Z9221 Personal history of antineoplastic chemotherapy: Secondary | ICD-10-CM | POA: Diagnosis not present

## 2019-04-15 DIAGNOSIS — Z20828 Contact with and (suspected) exposure to other viral communicable diseases: Secondary | ICD-10-CM | POA: Diagnosis present

## 2019-04-15 DIAGNOSIS — N400 Enlarged prostate without lower urinary tract symptoms: Secondary | ICD-10-CM | POA: Diagnosis present

## 2019-04-15 DIAGNOSIS — Z03818 Encounter for observation for suspected exposure to other biological agents ruled out: Secondary | ICD-10-CM | POA: Diagnosis not present

## 2019-04-15 DIAGNOSIS — Z7983 Long term (current) use of bisphosphonates: Secondary | ICD-10-CM | POA: Diagnosis not present

## 2019-04-15 DIAGNOSIS — K317 Polyp of stomach and duodenum: Secondary | ICD-10-CM | POA: Diagnosis present

## 2019-04-15 DIAGNOSIS — Z85528 Personal history of other malignant neoplasm of kidney: Secondary | ICD-10-CM | POA: Diagnosis not present

## 2019-04-15 DIAGNOSIS — M25412 Effusion, left shoulder: Secondary | ICD-10-CM | POA: Diagnosis not present

## 2019-04-15 DIAGNOSIS — J449 Chronic obstructive pulmonary disease, unspecified: Secondary | ICD-10-CM | POA: Diagnosis present

## 2019-04-15 DIAGNOSIS — R05 Cough: Secondary | ICD-10-CM | POA: Diagnosis not present

## 2019-04-15 DIAGNOSIS — R0902 Hypoxemia: Secondary | ICD-10-CM | POA: Diagnosis not present

## 2019-04-15 DIAGNOSIS — C649 Malignant neoplasm of unspecified kidney, except renal pelvis: Secondary | ICD-10-CM | POA: Diagnosis not present

## 2019-04-15 DIAGNOSIS — K920 Hematemesis: Secondary | ICD-10-CM | POA: Diagnosis present

## 2019-04-15 DIAGNOSIS — E119 Type 2 diabetes mellitus without complications: Secondary | ICD-10-CM | POA: Diagnosis not present

## 2019-04-15 DIAGNOSIS — C7800 Secondary malignant neoplasm of unspecified lung: Secondary | ICD-10-CM | POA: Diagnosis not present

## 2019-04-15 DIAGNOSIS — Z7902 Long term (current) use of antithrombotics/antiplatelets: Secondary | ICD-10-CM | POA: Diagnosis not present

## 2019-04-15 DIAGNOSIS — F419 Anxiety disorder, unspecified: Secondary | ICD-10-CM | POA: Diagnosis present

## 2019-04-15 DIAGNOSIS — R4182 Altered mental status, unspecified: Secondary | ICD-10-CM | POA: Diagnosis not present

## 2019-04-15 DIAGNOSIS — Z85118 Personal history of other malignant neoplasm of bronchus and lung: Secondary | ICD-10-CM | POA: Diagnosis not present

## 2019-04-15 DIAGNOSIS — Z905 Acquired absence of kidney: Secondary | ICD-10-CM | POA: Diagnosis not present

## 2019-04-15 MED ORDER — GADOBUTROL 1 MMOL/ML IV SOLN
8.0000 mL | Freq: Once | INTRAVENOUS | Status: AC | PRN
  Administered 2019-04-15: 10:00:00 8 mL via INTRAVENOUS

## 2019-04-16 ENCOUNTER — Other Ambulatory Visit: Payer: Self-pay

## 2019-04-16 ENCOUNTER — Inpatient Hospital Stay
Admission: EM | Admit: 2019-04-16 | Discharge: 2019-04-21 | DRG: 377 | Disposition: A | Discharge status: Home / Self Care | Payer: PPO | Attending: Specialist | Admitting: Specialist

## 2019-04-16 ENCOUNTER — Emergency Department: Payer: PPO

## 2019-04-16 ENCOUNTER — Telehealth: Payer: Self-pay | Admitting: *Deleted

## 2019-04-16 DIAGNOSIS — J449 Chronic obstructive pulmonary disease, unspecified: Secondary | ICD-10-CM | POA: Diagnosis present

## 2019-04-16 DIAGNOSIS — E039 Hypothyroidism, unspecified: Secondary | ICD-10-CM | POA: Diagnosis present

## 2019-04-16 DIAGNOSIS — Z7951 Long term (current) use of inhaled steroids: Secondary | ICD-10-CM

## 2019-04-16 DIAGNOSIS — Z87891 Personal history of nicotine dependence: Secondary | ICD-10-CM

## 2019-04-16 DIAGNOSIS — Z955 Presence of coronary angioplasty implant and graft: Secondary | ICD-10-CM

## 2019-04-16 DIAGNOSIS — K317 Polyp of stomach and duodenum: Secondary | ICD-10-CM | POA: Diagnosis present

## 2019-04-16 DIAGNOSIS — Z9221 Personal history of antineoplastic chemotherapy: Secondary | ICD-10-CM

## 2019-04-16 DIAGNOSIS — G8929 Other chronic pain: Secondary | ICD-10-CM | POA: Diagnosis present

## 2019-04-16 DIAGNOSIS — Z87442 Personal history of urinary calculi: Secondary | ICD-10-CM

## 2019-04-16 DIAGNOSIS — G92 Toxic encephalopathy: Secondary | ICD-10-CM | POA: Diagnosis present

## 2019-04-16 DIAGNOSIS — E785 Hyperlipidemia, unspecified: Secondary | ICD-10-CM | POA: Diagnosis present

## 2019-04-16 DIAGNOSIS — N400 Enlarged prostate without lower urinary tract symptoms: Secondary | ICD-10-CM | POA: Diagnosis present

## 2019-04-16 DIAGNOSIS — K920 Hematemesis: Principal | ICD-10-CM | POA: Diagnosis present

## 2019-04-16 DIAGNOSIS — K922 Gastrointestinal hemorrhage, unspecified: Secondary | ICD-10-CM

## 2019-04-16 DIAGNOSIS — Z79899 Other long term (current) drug therapy: Secondary | ICD-10-CM

## 2019-04-16 DIAGNOSIS — Z85528 Personal history of other malignant neoplasm of kidney: Secondary | ICD-10-CM | POA: Diagnosis not present

## 2019-04-16 DIAGNOSIS — F419 Anxiety disorder, unspecified: Secondary | ICD-10-CM | POA: Diagnosis present

## 2019-04-16 DIAGNOSIS — Z905 Acquired absence of kidney: Secondary | ICD-10-CM | POA: Diagnosis not present

## 2019-04-16 DIAGNOSIS — Z7982 Long term (current) use of aspirin: Secondary | ICD-10-CM

## 2019-04-16 DIAGNOSIS — Z7983 Long term (current) use of bisphosphonates: Secondary | ICD-10-CM

## 2019-04-16 DIAGNOSIS — D62 Acute posthemorrhagic anemia: Secondary | ICD-10-CM | POA: Diagnosis present

## 2019-04-16 DIAGNOSIS — I10 Essential (primary) hypertension: Secondary | ICD-10-CM | POA: Diagnosis present

## 2019-04-16 DIAGNOSIS — Z85820 Personal history of malignant melanoma of skin: Secondary | ICD-10-CM | POA: Diagnosis not present

## 2019-04-16 DIAGNOSIS — I2510 Atherosclerotic heart disease of native coronary artery without angina pectoris: Secondary | ICD-10-CM | POA: Diagnosis present

## 2019-04-16 DIAGNOSIS — Z7984 Long term (current) use of oral hypoglycemic drugs: Secondary | ICD-10-CM

## 2019-04-16 DIAGNOSIS — C7951 Secondary malignant neoplasm of bone: Secondary | ICD-10-CM | POA: Diagnosis present

## 2019-04-16 DIAGNOSIS — Z888 Allergy status to other drugs, medicaments and biological substances status: Secondary | ICD-10-CM

## 2019-04-16 DIAGNOSIS — Z20828 Contact with and (suspected) exposure to other viral communicable diseases: Secondary | ICD-10-CM | POA: Diagnosis present

## 2019-04-16 DIAGNOSIS — Z85118 Personal history of other malignant neoplasm of bronchus and lung: Secondary | ICD-10-CM

## 2019-04-16 DIAGNOSIS — E041 Nontoxic single thyroid nodule: Secondary | ICD-10-CM | POA: Diagnosis present

## 2019-04-16 DIAGNOSIS — Z7902 Long term (current) use of antithrombotics/antiplatelets: Secondary | ICD-10-CM

## 2019-04-16 DIAGNOSIS — Z79890 Hormone replacement therapy (postmenopausal): Secondary | ICD-10-CM

## 2019-04-16 DIAGNOSIS — Z9981 Dependence on supplemental oxygen: Secondary | ICD-10-CM

## 2019-04-16 DIAGNOSIS — E119 Type 2 diabetes mellitus without complications: Secondary | ICD-10-CM

## 2019-04-16 LAB — COMPREHENSIVE METABOLIC PANEL
ALT: 12 U/L (ref 0–44)
AST: 17 U/L (ref 15–41)
Albumin: 3.9 g/dL (ref 3.5–5.0)
Alkaline Phosphatase: 63 U/L (ref 38–126)
Anion gap: 13 (ref 5–15)
BUN: 49 mg/dL — ABNORMAL HIGH (ref 8–23)
CO2: 22 mmol/L (ref 22–32)
Calcium: 8.9 mg/dL (ref 8.9–10.3)
Chloride: 105 mmol/L (ref 98–111)
Creatinine, Ser: 0.99 mg/dL (ref 0.61–1.24)
GFR calc Af Amer: >60 mL/min (ref >60)
GFR calc non Af Amer: >60 mL/min (ref >60)
Glucose, Bld: 123 mg/dL — ABNORMAL HIGH (ref 70–99)
Potassium: 4.2 mmol/L (ref 3.5–5.1)
Sodium: 140 mmol/L (ref 135–145)
Total Bilirubin: 0.6 mg/dL (ref 0.3–1.2)
Total Protein: 6.7 g/dL (ref 6.5–8.1)

## 2019-04-16 LAB — CBC WITH DIFFERENTIAL/PLATELET
Abs Immature Granulocytes: 0.03 10*3/uL (ref 0.00–0.07)
Basophils Absolute: 0 10*3/uL (ref 0.0–0.1)
Basophils Relative: 0 %
Eosinophils Absolute: 0 10*3/uL (ref 0.0–0.5)
Eosinophils Relative: 0 %
HCT: 29.8 % — ABNORMAL LOW (ref 39.0–52.0)
Hemoglobin: 9.5 g/dL — ABNORMAL LOW (ref 13.0–17.0)
Immature Granulocytes: 0 %
Lymphocytes Relative: 9 %
Lymphs Abs: 0.9 10*3/uL (ref 0.7–4.0)
MCH: 28.7 pg (ref 26.0–34.0)
MCHC: 31.9 g/dL (ref 30.0–36.0)
MCV: 90 fL (ref 80.0–100.0)
Monocytes Absolute: 0.6 10*3/uL (ref 0.1–1.0)
Monocytes Relative: 6 %
Neutro Abs: 8.1 10*3/uL — ABNORMAL HIGH (ref 1.7–7.7)
Neutrophils Relative %: 85 %
Platelets: 176 10*3/uL (ref 150–400)
RBC: 3.31 MIL/uL — ABNORMAL LOW (ref 4.22–5.81)
RDW: 14.8 % (ref 11.5–15.5)
WBC: 9.6 10*3/uL (ref 4.0–10.5)
nRBC: 0 % (ref 0.0–0.2)

## 2019-04-16 LAB — APTT: aPTT: 24 seconds (ref 24–36)

## 2019-04-16 LAB — SARS CORONAVIRUS 2 BY RT PCR (HOSPITAL ORDER, PERFORMED IN ~~LOC~~ HOSPITAL LAB): SARS Coronavirus 2: NEGATIVE

## 2019-04-16 LAB — PROTIME-INR
INR: 1.1 (ref 0.8–1.2)
Prothrombin Time: 14.3 seconds (ref 11.4–15.2)

## 2019-04-16 LAB — TROPONIN I: Troponin I: <0.03 ng/mL (ref <0.03)

## 2019-04-16 LAB — MRSA PCR SCREENING: MRSA by PCR: NEGATIVE

## 2019-04-16 LAB — GLUCOSE, CAPILLARY: Glucose-Capillary: 121 mg/dL — ABNORMAL HIGH (ref 70–99)

## 2019-04-16 MED ORDER — ACETAMINOPHEN 325 MG PO TABS
650.0000 mg | ORAL_TABLET | Freq: Four times a day (QID) | ORAL | Status: DC | PRN
  Administered 2019-04-17: 650 mg via ORAL
  Filled 2019-04-16: qty 2

## 2019-04-16 MED ORDER — FLUTICASONE PROPIONATE 50 MCG/ACT NA SUSP
2.0000 | Freq: Every day | NASAL | Status: DC | PRN
  Filled 2019-04-16: qty 16

## 2019-04-16 MED ORDER — PANTOPRAZOLE SODIUM 40 MG IV SOLR
40.0000 mg | Freq: Once | INTRAVENOUS | Status: AC
  Administered 2019-04-16: 40 mg via INTRAVENOUS
  Filled 2019-04-16: qty 40

## 2019-04-16 MED ORDER — TIZANIDINE HCL 4 MG PO TABS
4.0000 mg | ORAL_TABLET | Freq: Every day | ORAL | Status: DC
  Filled 2019-04-16: qty 1

## 2019-04-16 MED ORDER — OXYCODONE HCL 5 MG PO TABS: 10.0000 mg | ORAL_TABLET | ORAL | Status: DC | PRN

## 2019-04-16 MED ORDER — ASPIRIN EC 81 MG PO TBEC: 81.0000 mg | DELAYED_RELEASE_TABLET | Freq: Every day | ORAL | Status: DC

## 2019-04-16 MED ORDER — FENTANYL 75 MCG/HR TD PT72
1.0000 | MEDICATED_PATCH | TRANSDERMAL | Status: DC
  Administered 2019-04-17 – 2019-04-19 (×2): 1 via TRANSDERMAL
  Filled 2019-04-16 (×3): qty 1

## 2019-04-16 MED ORDER — TEMAZEPAM 7.5 MG PO CAPS
7.5000 mg | ORAL_CAPSULE | Freq: Every evening | ORAL | Status: DC | PRN
  Filled 2019-04-16: qty 1

## 2019-04-16 MED ORDER — INSULIN ASPART 100 UNIT/ML ~~LOC~~ SOLN
0.0000 [IU] | Freq: Three times a day (TID) | SUBCUTANEOUS | Status: DC
  Administered 2019-04-17: 13:00:00 1 [IU] via SUBCUTANEOUS
  Filled 2019-04-16: qty 1

## 2019-04-16 MED ORDER — INSULIN ASPART 100 UNIT/ML ~~LOC~~ SOLN: 0.0000 [IU] | Freq: Every day | SUBCUTANEOUS | Status: DC

## 2019-04-16 MED ORDER — PANTOPRAZOLE SODIUM 40 MG IV SOLR
40.0000 mg | Freq: Two times a day (BID) | INTRAVENOUS | Status: DC
  Administered 2019-04-17 – 2019-04-20 (×7): 40 mg via INTRAVENOUS
  Filled 2019-04-16 (×7): qty 40

## 2019-04-16 MED ORDER — TAMSULOSIN HCL 0.4 MG PO CAPS
0.4000 mg | ORAL_CAPSULE | ORAL | Status: DC
  Administered 2019-04-17 – 2019-04-20 (×4): 0.4 mg via ORAL
  Filled 2019-04-16 (×4): qty 1

## 2019-04-16 MED ORDER — POLYETHYLENE GLYCOL 3350 17 G PO PACK: 17.0000 g | PACK | Freq: Every day | ORAL | Status: DC | PRN

## 2019-04-16 MED ORDER — UMECLIDINIUM BROMIDE 62.5 MCG/INH IN AEPB
1.0000 | INHALATION_SPRAY | Freq: Every day | RESPIRATORY_TRACT | Status: DC
  Administered 2019-04-17 – 2019-04-20 (×4): 1 via RESPIRATORY_TRACT
  Filled 2019-04-16: qty 7

## 2019-04-16 MED ORDER — ALBUTEROL SULFATE HFA 108 (90 BASE) MCG/ACT IN AERS
2.0000 | INHALATION_SPRAY | Freq: Four times a day (QID) | RESPIRATORY_TRACT | Status: DC | PRN
  Filled 2019-04-16: qty 6.7

## 2019-04-16 MED ORDER — LORATADINE 10 MG PO TABS: 10.0000 mg | ORAL_TABLET | Freq: Every day | ORAL | Status: DC | PRN

## 2019-04-16 MED ORDER — SODIUM CHLORIDE 0.9 % IV SOLN
INTRAVENOUS | Status: DC
  Administered 2019-04-16 – 2019-04-18 (×3): via INTRAVENOUS

## 2019-04-16 MED ORDER — ONDANSETRON HCL 4 MG PO TABS: 4.0000 mg | ORAL_TABLET | Freq: Four times a day (QID) | ORAL | Status: DC | PRN

## 2019-04-16 MED ORDER — MONTELUKAST SODIUM 10 MG PO TABS
10.0000 mg | ORAL_TABLET | Freq: Every day | ORAL | Status: DC
  Administered 2019-04-16 – 2019-04-20 (×5): 10 mg via ORAL
  Filled 2019-04-16 (×5): qty 1

## 2019-04-16 MED ORDER — LEVOTHYROXINE SODIUM 50 MCG PO TABS
75.0000 ug | ORAL_TABLET | Freq: Every day | ORAL | Status: DC
  Administered 2019-04-18 – 2019-04-21 (×4): 75 ug via ORAL
  Filled 2019-04-16 (×4): qty 1

## 2019-04-16 MED ORDER — BUSPIRONE HCL 5 MG PO TABS
5.0000 mg | ORAL_TABLET | Freq: Two times a day (BID) | ORAL | Status: DC
  Administered 2019-04-16: 22:00:00 5 mg via ORAL
  Filled 2019-04-16 (×3): qty 1

## 2019-04-16 MED ORDER — CHOLECALCIFEROL 10 MCG (400 UNIT) PO TABS
400.0000 [IU] | ORAL_TABLET | Freq: Every day | ORAL | Status: DC
  Administered 2019-04-17 – 2019-04-21 (×5): 400 [IU] via ORAL
  Filled 2019-04-16 (×5): qty 1

## 2019-04-16 MED ORDER — IPRATROPIUM-ALBUTEROL 0.5-2.5 (3) MG/3ML IN SOLN: 3.0000 mL | RESPIRATORY_TRACT | Status: DC | PRN

## 2019-04-16 MED ORDER — MELATONIN 5 MG PO TABS
10.0000 mg | ORAL_TABLET | Freq: Every day | ORAL | Status: DC
  Administered 2019-04-16: 10 mg via ORAL
  Filled 2019-04-16 (×2): qty 2

## 2019-04-16 MED ORDER — ONDANSETRON HCL 4 MG/2ML IJ SOLN: 4.0000 mg | Freq: Four times a day (QID) | INTRAMUSCULAR | Status: DC | PRN

## 2019-04-16 MED ORDER — ACETAMINOPHEN 650 MG RE SUPP: 650.0000 mg | Freq: Four times a day (QID) | RECTAL | Status: DC | PRN

## 2019-04-16 MED ORDER — ATORVASTATIN CALCIUM 20 MG PO TABS
40.0000 mg | ORAL_TABLET | Freq: Every day | ORAL | Status: DC
  Administered 2019-04-16 – 2019-04-21 (×6): 40 mg via ORAL
  Filled 2019-04-16 (×6): qty 2

## 2019-04-16 MED ORDER — DULOXETINE HCL 30 MG PO CPEP: 60.0000 mg | ORAL_CAPSULE | Freq: Every day | ORAL | Status: DC

## 2019-04-16 NOTE — Telephone Encounter (Signed)
Patient and patient's wife called me personally on my clinical line. Patient vomiting dark red blood, very weak and short of breath. Mild wheezing. Pt feels very weak. Wife requesting an apt in c.ctr for evaluation. I asked the patient to go to the ER. Pt became increasingly short of breath when communicating. Wife stated that she would call 911 for transport. Wife has placed nasal cannula on pt's  to help with pt's shortness of breath.

## 2019-04-16 NOTE — H&P (Signed)
[facility] NAME: Glen Davis    MR#:  [id number]  DATE OF BIRTH:  January 29, 1941  DATE OF ADMISSION:  04/16/2019  PRIMARY CARE PHYSICIAN: Dion Body, MD   REQUESTING/REFERRING PHYSICIAN: Lavonia Drafts, MD  CHIEF COMPLAINT:   Chief Complaint  Patient presents with   Shortness of Breath    HISTORY OF PRESENT ILLNESS:  Glen Davis  is a 78 y.o. male with a known history of hypertension, CAD, COPD, metastatic renal cell carcinoma who presented to the ED with hematemesis.  He started vomiting this afternoon at 2 PM.  The vomit looked like dark red blood.  He had a couple episodes of vomiting.  He denies any hematochezia or melena.  He endorses some mild epigastric abdominal pain.  He denies any NSAID use.  He denies any fevers, chills, chest pain, or shortness of breath.  In the ED, he was tachycardic with heart rate up to 111.  Labs were significant for hemoglobin 9.5.  Hospitalists were called for admission.  PAST MEDICAL HISTORY:   Past Medical History:  Diagnosis Date   CAD (coronary artery disease)    Cancer ([place])    left arm   COPD (chronic obstructive pulmonary disease) ([place])    Hypertension    Kidney stone    Lung cancer ([place])    Melanoma ([place]) 01/24/2016   right shoulder   Thyroid nodule 02/02/2016   left BENIGN THYROID NODULE by FNA    PAST SURGICAL HISTORY:   Past Surgical History:  Procedure Laterality Date   arm surgery Left    arm   cardiac stents  2011   Angioplasty / Stenting Femoral-X2   COLONOSCOPY  04/01/12   CORONARY ANGIOPLASTY     EXCISION MELANOMA WITH SENTINEL LYMPH NODE BIOPSY Right 01/24/2016   Procedure: EXCISION MELANOMA Right Shoulder;  Surgeon: Robert Bellow, MD;  Location: [facility] ORS;  Service: General;  Laterality: Right;   LAPAROSCOPIC NEPHRECTOMY, HAND ASSISTED Left 04/24/2016   Procedure: HAND ASSISTED LAPAROSCOPIC NEPHRECTOMY;  Surgeon: Hollice Espy, MD;   Location: [facility] ORS;  Service: Urology;  Laterality: Left;   TIBIA IM NAIL INSERTION Left 02/11/2019   Procedure: INTRAMEDULLARY (IM) NAIL TIBIAL;  Surgeon: Lovell Sheehan, MD;  Location: [facility] ORS;  Service: Orthopedics;  Laterality: Left;   VIDEO ASSISTED THORACOSCOPY (VATS)/THOROCOTOMY Left 03/08/2016   Procedure: VIDEO ASSISTED THORACOSCOPY (VATS) with lung biopsy - Left ;  Surgeon: Robert Bellow, MD;  Location: [facility] ORS;  Service: General;  Laterality: Left;    SOCIAL HISTORY:   Social History   Tobacco Use   Smoking status: Former Smoker    Packs/day: 2.00    Years: 30.00    Pack years: 60.00    Types: Cigarettes    Start date: 01/18/1956    Last attempt to quit: 01/17/1969    Years since quitting: 50.2   Smokeless tobacco: Never Used  Substance Use Topics   Alcohol use: Not Currently    Alcohol/week: 0.0 standard drinks    Comment: BEER OCC    FAMILY HISTORY:   Family History  Problem Relation Age of Onset   Hodgkin's lymphoma Daughter 5   Heart attack Father    Kidney cancer Neg Hx    Kidney disease Neg Hx    Prostate cancer Neg Hx     DRUG ALLERGIES:   Allergies  Allergen Reactions   Lisinopril Swelling   Other Itching    Nitroglycerin Patch.  REVIEW OF SYSTEMS:   Review of Systems  Constitutional: Negative for chills and fever.  HENT: Negative for congestion and sore throat.   Eyes: Negative for blurred vision and double vision.  Respiratory: Negative for cough and shortness of breath.   Cardiovascular: Negative for chest pain and palpitations.  Gastrointestinal: Positive for abdominal pain, nausea and vomiting. Negative for blood in stool and melena.  Genitourinary: Negative for dysuria and urgency.  Musculoskeletal: Negative for back pain and neck pain.  Neurological: Negative for dizziness and headaches.  Psychiatric/Behavioral: Negative for depression. The patient is not nervous/anxious.     MEDICATIONS AT HOME:   Prior to  Admission medications   Medication Sig Start Date End Date Taking? Authorizing Provider  albuterol (PROVENTIL HFA;VENTOLIN HFA) 108 (90 Base) MCG/ACT inhaler Inhale 2 puffs into the lungs every 6 (six) hours as needed for wheezing or shortness of breath. 02/17/18   Cammie Sickle, MD  aspirin EC 81 MG tablet Take 81 mg by mouth daily.    [provider]  Aspirin-Salicylamide-Caffeine (BC HEADACHE POWDER PO) Take 1 packet by mouth daily.    [provider]  atorvastatin (LIPITOR) 40 MG tablet Take 1 tablet (40 mg total) by mouth daily. 02/23/19   Cammie Sickle, MD  busPIRone (BUSPAR) 5 MG tablet Take 5 mg by mouth 2 (two) times daily. 01/14/19   [provider]  cholecalciferol (VITAMIN D) 400 units TABS tablet Take 400 Units by mouth daily.    [provider]  clopidogrel (PLAVIX) 75 MG tablet Take 75 mg by mouth daily.    [provider]  CVS CITRATE OF MAGNESIA SOLN Take 15 mLs by mouth as needed for constipation. 02/17/19   [provider]  DULoxetine (CYMBALTA) 30 MG capsule Take 2 capsules by mouth daily. 08/05/17 10/08/20  [provider]  fentaNYL (DURAGESIC) 75 MCG/HR Place 1 patch onto the skin every 3 (three) days. 03/16/19   Cammie Sickle, MD  fluticasone (FLONASE) 50 MCG/ACT nasal spray Place 2 sprays into both nostrils daily. 12/27/17   Jacquelin Hawking, NP  ipratropium-albuterol (DUONEB) 0.5-2.5 (3) MG/3ML SOLN Take 3 mLs by nebulization every 4 (four) hours as needed. 04/04/17   Cammie Sickle, MD  levothyroxine (SYNTHROID) 75 MCG tablet Take 1 tablet (75 mcg total) by mouth daily before breakfast. 04/09/19   Cammie Sickle, MD  loperamide (IMODIUM) 1 MG/5ML solution Take 4 mg by mouth as needed for diarrhea or loose stools.    [provider]  loratadine (CLARITIN) 10 MG tablet Take 10 mg by mouth daily.    [provider]  MELATONIN PO Take 10 mg by mouth at bedtime.      [provider]  metFORMIN (GLUCOPHAGE) 500 MG tablet TAKE 1 TABLET (500 MG TOTAL) BY MOUTH 2 (TWO) TIMES DAILY WITH A MEAL. 02/26/19   Cammie Sickle, MD  Misc Natural Products (GLUCOSAMINE CHOND COMPLEX/MSM) TABS Take 2 tablets by mouth daily.    [provider]  montelukast (SINGULAIR) 10 MG tablet TAKE 1 TABLET BY MOUTH EVERYDAY AT BEDTIME 04/06/19   Cammie Sickle, MD  nitroGLYCERIN (NITROSTAT) 0.4 MG SL tablet Place 1 tablet (0.4 mg total) under the tongue every 5 (five) minutes as needed. 03/16/19   Cammie Sickle, MD  Omega-3 Fatty Acids (FISH OIL) 1000 MG CPDR Take 1 capsule by mouth daily.    [provider]  oxyCODONE 10 MG TABS Take 1-2 tablets (10-20 mg total) by mouth  every 4 (four) hours as needed for breakthrough pain. 04/10/19   Verlon Au, NP  OXYGEN Inhale 2 L into the lungs at bedtime.    [provider]  tamsulosin (FLOMAX) 0.4 MG CAPS capsule TAKE 1 CAPSULE (0.4 MG TOTAL) BY MOUTH EVERY MORNING. 07/03/18   Verlon Au, NP  temazepam (RESTORIL) 7.5 MG capsule Take 1 capsule (7.5 mg total) by mouth at bedtime as needed for sleep. 02/23/19   Cammie Sickle, MD  Tiotropium Bromide Monohydrate (SPIRIVA RESPIMAT) 1.25 MCG/ACT AERS Inhale 1.25 Act into the lungs 2 (two) times daily. 02/17/18   Cammie Sickle, MD  tiZANidine (ZANAFLEX) 4 MG tablet Take 4 mg by mouth daily.  06/23/18   [provider]  Turmeric 500 MG TABS Take 1 tablet by mouth daily.    [provider]      VITAL SIGNS:  Blood pressure 119/84, pulse (!) 111, temperature 98.1 F (36.7 C), temperature source Oral, height 6\' 1"  (1.854 m), weight 79.4 kg, SpO2 100 %.  PHYSICAL EXAMINATION:  Physical Exam  GENERAL:  78 y.o.-year-old patient lying in the bed with no acute distress.  EYES: Pupils equal, round, reactive to light and accommodation. No scleral icterus. Extraocular muscles intact.  HEENT: Head atraumatic, normocephalic.  Oropharynx and nasopharynx clear.  NECK:  Supple, no jugular venous distention. No thyroid enlargement, no tenderness.  LUNGS: Normal breath sounds bilaterally, no wheezing, rales,rhonchi or crepitation. No use of accessory muscles of respiration.  CARDIOVASCULAR: Tachycardic, regular rhythm, S1, S2 normal. No murmurs, rubs, or gallops.  ABDOMEN: Soft, nontender, nondistended. Bowel sounds present. No organomegaly or mass.  EXTREMITIES: No pedal edema, cyanosis, or clubbing.  NEUROLOGIC: Cranial nerves II through XII are intact. Muscle strength 5/5 in all extremities. Sensation intact. Gait not checked.  PSYCHIATRIC: The patient is alert and oriented x 3.  SKIN: No obvious rash, lesion, or ulcer.   LABORATORY PANEL:   CBC Recent Labs  Lab 04/16/19 1616  WBC 9.6  HGB 9.5*  HCT 29.8*  PLT 176   ------------------------------------------------------------------------------------------------------------------  Chemistries  Recent Labs  Lab 04/16/19 1616  NA 140  K 4.2  CL 105  CO2 22  GLUCOSE 123*  BUN 49*  CREATININE 0.99  CALCIUM 8.9  AST 17  ALT 12  ALKPHOS 63  BILITOT 0.6   ------------------------------------------------------------------------------------------------------------------  Cardiac Enzymes Recent Labs  Lab 04/16/19 1616  TROPONINI <0.03   ------------------------------------------------------------------------------------------------------------------  RADIOLOGY:  Mr Shoulder Left W Wo Contrast  Result Date: 04/15/2019 CLINICAL DATA:  Left shoulder pain. Fell 6 weeks ago and fractured glenoid. Persistent pain and limited range of motion. History of renal cell cancer and lung cancer. EXAM: MRI OF THE LEFT SHOULDER WITHOUT AND WITH CONTRAST TECHNIQUE: Multiplanar, multisequence MR imaging of the left shoulder was performed before and after the administration of intravenous contrast. CONTRAST:  8 cc Gadavist COMPARISON:  CT shoulder 10/20/2018 and CT  chest, abdomen and pelvis 03/30/2019 FINDINGS: Rotator cuff: Moderate rotator cuff tendinopathy/tendinosis but no full-thickness retracted rotator cuff tear. Muscles: Marked edema like signal abnormality in the supraspinatus and infraspinatus muscles could suggest a muscle strain, myositis or radiation change. No discrete muscle mass. Biceps long head:  Intact. Acromioclavicular Joint: Chronic AC joint degenerative changes and mild subluxation of the joint. Edema like signal abnormality in the distal clavicle and acromion likely on going stress related degenerative changes. There is also a small joint effusion and mild pannus formation. Type 2 acromion. No significant lateral downsloping or undersurface spurring.  Glenohumeral Joint: Small joint effusion and moderate synovitis. Moderate degenerative changes. Labrum:  The superior labrum is torn and partially detached. Bones: Large enhancing metastatic lesion involving the glenoid with a chronic ununited pathologic fracture. This measures approximately 3.2 x 2.8 cm. No other obvious metastatic bone lesions. Other: Moderate subacromial/subdeltoid bursitis. IMPRESSION: 1. 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 2. Rotator cuff tendinopathy/tendinosis but no full-thickness rotator cuff tear. 3. Torn and detached superior labrum. 4. Chronic AC joint degenerative changes and glenohumeral joint degenerative changes. Electronically Signed   By: Marijo Sanes M.D.   On: 04/15/2019 13:47   Dg Chest Port 1 View  Result Date: 04/16/2019 CLINICAL DATA:  History of lung cancer. EXAM: PORTABLE CHEST 1 VIEW COMPARISON:  Chest CT 03/30/2019 FINDINGS: The cardiac silhouette, mediastinal and hilar contours are within normal limits and stable. Multiple vague scattered pulmonary lesions consistent with known pulmonary metastatic disease. No acute overlying pulmonary findings or pleural effusion. Left glenoid bone lesion again noted. IMPRESSION: Pulmonary  and osseous metastatic disease but no acute overlying pulmonary process. Electronically Signed   By: Marijo Sanes M.D.   On: 04/16/2019 16:32      IMPRESSION AND PLAN:   Hematemesis- may be due to gastritis versus ulcer.  Hemoglobin dropped from 12.5 > 9.5. -IV PPI -GI consult -Hold aspirin and plavix -Hold anticoagulation -Serial H/H -Clear liquids tonight, will make patient n.p.o. at midnight in case GI would like to scope him in the morning  Acute blood loss anemia- due to above -Serial H/H  Metastatic renal cell carcinoma- follows with Dr. Rogue Bussing as an outpatient. -Continue home pain meds  Type 2 diabetes- blood sugars mildly elevated in the ED -SSI  COPD-stable, no signs of acute exacerbation -Continue home inhalers -DuoNebs PRN  Hypothyroidism-stable -Continue home Synthroid  Hyperlipidemia-stable -Continue home Lipitor  Depression/anxiety-stable -Continue home BuSpar, Cymbalta, temazepam  All the records are reviewed and case discussed with ED provider. Management plans discussed with the patient, family and they are in agreement.  CODE STATUS: FULL  TOTAL TIME TAKING CARE OF THIS PATIENT: 45 minutes.    Berna Spare Blayde Bacigalupi M.D on 04/16/2019 at 6:25 PM  Between 7am to 6pm - Pager [phone]  After 6pm go to [email or web address] - Proofreader  [facility]  Office  [phone]  CC: Primary care physician; Dion Body, MD   Note: This dictation was prepared with Dragon dictation along with smaller phrase technology. Any transcriptional errors that result from this process are unintentional.

## 2019-04-16 NOTE — ED Notes (Signed)
Pt trying to stand at bedside with assistance to urinate. HR increased to 140's, resp 40, pt pale. Pt assist to laying in stretcher, condom cath applied. Vs returned to pts norm

## 2019-04-16 NOTE — Progress Notes (Signed)
Family Meeting Note  Advance Directive:yes  Today a meeting took place with the Patient.  Patient is able to participate.   The following clinical team members were present during this meeting:MD  The following were discussed:Patient's diagnosis: hematemesis, Patient's progosis: Unable to determine and Goals for treatment: Full Code  Additional follow-up to be provided: prn  Time spent during discussion:20 minutes  Evette Doffing, MD

## 2019-04-16 NOTE — ED Triage Notes (Signed)
Pt brought in by EMS for vomiting up blood and SOB. Pt states he started vomiting blood around 2:00 and this occurred 3 times. Per EMS while ambulating to the stretcher he became SOB and O2 saturation was 92% on RA. Pt placed on 4 L  and O2 saturation improved to 100%. Pt states he no longer feels SOB.

## 2019-04-16 NOTE — ED Provider Notes (Signed)
[facility] Emergency Department Provider Note   ____________________________________________    I have reviewed the triage vital signs and the nursing notes.   HISTORY  Chief Complaint Vomiting blood    HPI Glen Davis is a 78 y.o. male who presents with complaints of vomiting blood.  Patient reports today he developed nausea and vomited several times which he said was primarily bright red blood.  Denies abdominal pain.  Patient does have a history of kidney cancer with metastasis, followed by Dr. Rogue Bussing, is receiving chemotherapy for this last treatment apparently last week per patient.  He denies fevers or chills.  No myalgias.  Reports normal stools.  Has never had this before   Past Medical History:  Diagnosis Date  . CAD (coronary artery disease)   . Cancer ([place])    left arm  . COPD (chronic obstructive pulmonary disease) ([place])   . Hypertension   . Kidney stone   . Lung cancer ([place])   . Melanoma ([place]) 01/24/2016   right shoulder  . Thyroid nodule 02/02/2016   left BENIGN THYROID NODULE by FNA    Patient Active Problem List   Diagnosis Date Noted  . Unsp fracture of upper end of unsp tibia, init for clos fx 02/10/2019  . Cancer, metastatic to bone ([place]) 05/18/2018  . Orthostatic hypotension 03/24/2018  . Acute sinusitis 02/06/2017  . Cough 10/09/2016  . SOB (shortness of breath) 09/13/2016  . Syncope 08/17/2016  . Situational anxiety 06/05/2016  . Cancer of left kidney excluding renal pelvis ([place]) 07/12/202017  . 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 Coronary artery disease involving native coronary artery of native heart without angina pectoris 03/16/2016  . Essential (primary) hypertension 03/16/2016  . Hypertriglyceridemia 03/16/2016  . Left thyroid nodule 02/03/2016  . Pulmonary nodules/lesions, multiple 02/03/2016  . Counseling regarding goals of care 01/30/2016  . Vaccine counseling 01/30/2016  . 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 Melanoma of skin ([place]) 01/12/2016  . Breathlessness on exertion 04/25/2015    Past Surgical History:  Procedure Laterality Date  . arm surgery Left    arm  . cardiac stents  2011   Angioplasty / Stenting Femoral-X2  . COLONOSCOPY  04/01/12  . CORONARY ANGIOPLASTY    . EXCISION MELANOMA WITH SENTINEL LYMPH NODE BIOPSY Right 01/24/2016   Procedure: EXCISION MELANOMA Right Shoulder;  Surgeon:  Bellow, MD;  Location: [facility] ORS;  Service: General;  Laterality: Right;  . LAPAROSCOPIC NEPHRECTOMY, HAND ASSISTED Left 04/24/2016   Procedure: HAND ASSISTED LAPAROSCOPIC NEPHRECTOMY;  Surgeon: Hollice Espy, MD;  Location: [facility] ORS;  Service: Urology;  Laterality: Left;  . TIBIA IM NAIL INSERTION Left 02/11/2019   Procedure: INTRAMEDULLARY (IM) NAIL TIBIAL;  Surgeon: Lovell Sheehan, MD;  Location: [facility] ORS;  Service: Orthopedics;  Laterality: Left;  Marland Kitchen VIDEO ASSISTED THORACOSCOPY (VATS)/THOROCOTOMY Left 03/08/2016   Procedure: VIDEO ASSISTED THORACOSCOPY (VATS) with lung biopsy - Left ;  Surgeon:  Bellow, MD;  Location: [facility] ORS;  Service: General;  Laterality: Left;    Prior to Admission medications   Medication Sig Start Date End Date Taking? Authorizing Provider  albuterol (PROVENTIL HFA;VENTOLIN HFA) 108 (90 Base) MCG/ACT inhaler Inhale 2 puffs into the lungs every 6 (six) hours as needed for wheezing or shortness of breath. 02/17/18   Cammie Sickle, MD  aspirin EC 81 MG tablet Take 81 mg by mouth daily.    [provider]  Aspirin-Salicylamide-Caffeine [facility]  HEADACHE POWDER PO) Take 1 packet by mouth daily.    [provider]  atorvastatin (LIPITOR) 40 MG tablet Take 1 tablet (40 mg total) by mouth daily. 02/23/19   Cammie Sickle, MD  busPIRone (BUSPAR) 5 MG tablet Take 5 mg by mouth 2 (two) times daily. 01/14/19   [provider]  cholecalciferol (VITAMIN D) 400 units TABS tablet Take 400 Units by mouth  daily.    [provider]  clopidogrel (PLAVIX) 75 MG tablet Take 75 mg by mouth daily.    [provider]  CVS CITRATE OF MAGNESIA SOLN Take 15 mLs by mouth as needed for constipation. 02/17/19   [provider]  DULoxetine (CYMBALTA) 30 MG capsule Take 2 capsules by mouth daily. 08/05/17 10/08/20  [provider]  fentaNYL (DURAGESIC) 75 MCG/HR Place 1 patch onto the skin every 3 (three) days. 03/16/19   Cammie Sickle, MD  fluticasone (FLONASE) 50 MCG/ACT nasal spray Place 2 sprays into both nostrils daily. 12/27/17   Jacquelin Hawking, NP  ipratropium-albuterol (DUONEB) 0.5-2.5 (3) MG/3ML SOLN Take 3 mLs by nebulization every 4 (four) hours as needed. 04/04/17   Cammie Sickle, MD  levothyroxine (SYNTHROID) 75 MCG tablet Take 1 tablet (75 mcg total) by mouth daily before breakfast. 04/09/19   Cammie Sickle, MD  loperamide (IMODIUM) 1 MG/5ML solution Take 4 mg by mouth as needed for diarrhea or loose stools.    [provider]  loratadine (CLARITIN) 10 MG tablet Take 10 mg by mouth daily.    [provider]  MELATONIN PO Take 10 mg by mouth at bedtime.     [provider]  metFORMIN (GLUCOPHAGE) 500 MG tablet TAKE 1 TABLET (500 MG TOTAL) BY MOUTH 2 (TWO) TIMES DAILY WITH A MEAL. 02/26/19   Cammie Sickle, MD  Misc Natural Products (GLUCOSAMINE CHOND COMPLEX/MSM) TABS Take 2 tablets by mouth daily.    [provider]  montelukast (SINGULAIR) 10 MG tablet TAKE 1 TABLET BY MOUTH EVERYDAY AT BEDTIME 04/06/19   Cammie Sickle, MD  nitroGLYCERIN (NITROSTAT) 0.4 MG SL tablet Place 1 tablet (0.4 mg total) under the tongue every 5 (five) minutes as needed. 03/16/19   Cammie Sickle, MD  Omega-3 Fatty Acids (FISH OIL) 1000 MG CPDR Take 1 capsule by mouth daily.    [provider]  oxyCODONE 10 MG TABS Take 1-2 tablets (10-20 mg total) by mouth every 4 (four) hours as needed for breakthrough pain.  04/10/19   Verlon Au, NP  OXYGEN Inhale 2 L into the lungs at bedtime.    [provider]  tamsulosin (FLOMAX) 0.4 MG CAPS capsule TAKE 1 CAPSULE (0.4 MG TOTAL) BY MOUTH EVERY MORNING. 07/03/18   Verlon Au, NP  temazepam (RESTORIL) 7.5 MG capsule Take 1 capsule (7.5 mg total) by mouth at bedtime as needed for sleep. 02/23/19   Cammie Sickle, MD  Tiotropium Bromide Monohydrate (SPIRIVA RESPIMAT) 1.25 MCG/ACT AERS Inhale 1.25 Act into the lungs 2 (two) times daily. 02/17/18   Cammie Sickle, MD  tiZANidine (ZANAFLEX) 4 MG tablet Take 4 mg by mouth daily.  06/23/18   [provider]  Turmeric 500 MG TABS Take 1 tablet by mouth daily.    [provider]     Allergies Lisinopril and Other  Family History  Problem Relation Age of Onset  . Hodgkin's lymphoma Daughter 5  . Heart attack Father   . Kidney cancer Neg Hx   .  Kidney disease Neg Hx   . Prostate cancer Neg Hx     Social History Social History   Tobacco Use  . Smoking status: Former Smoker    Packs/day: 2.00    Years: 30.00    Pack years: 60.00    Types: Cigarettes    Start date: 01/18/1956    Last attempt to quit: 01/17/1969    Years since quitting: 50.2  . Smokeless tobacco: Never Used  Substance Use Topics  . Alcohol use: Not Currently    Alcohol/week: 0.0 standard drinks    Comment: BEER OCC  . Drug use: No    Review of Systems  Constitutional: No fever/chills Eyes: No visual changes.  ENT: No sore throat. Cardiovascular: Denies chest pain. Respiratory: Denies shortness of breath. Gastrointestinal: As above Genitourinary: Negative for dysuria. Musculoskeletal: Negative for back pain. Skin: Negative for rash. Neurological: Negative for headaches   ____________________________________________   PHYSICAL EXAM:  VITAL SIGNS: ED Triage Vitals  Enc Vitals Group     BP 04/16/19 1609 121/83     Pulse Rate 04/16/19 1609 (!) 109     Resp --      Temp 04/16/19 1609  98.1 F (36.7 C)     Temp Source 04/16/19 1609 Oral     SpO2 04/16/19 1609 100 %     Weight 04/16/19 1610 79.4 kg (175 lb)     Height 04/16/19 1610 1.854 m (6\' 1" )     Head Circumference --      Peak Flow --      Pain Score 04/16/19 1610 0     Pain Loc --      Pain Edu? --      Excl. in [place]? --     Constitutional: Alert and oriented.  Eyes: Conjunctivae are normal.   Nose: No congestion/rhinnorhea. Mouth/Throat: Mucous membranes are moist.    Cardiovascular: Normal rate, regular rhythm. Grossly normal heart sounds.  Good peripheral circulation. Respiratory: Normal respiratory effort.  No retractions. Lungs CTAB. Gastrointestinal: Soft and nontender. No distention.  Reassuring exam  Musculoskeletal: No lower extremity tenderness nor edema.  Warm and well perfused Neurologic:  Normal speech and language. No gross focal neurologic deficits are appreciated.  Skin:  Skin is warm, dry and intact. No rash noted. Psychiatric: Mood and affect are normal. Speech and behavior are normal.  ____________________________________________   LABS (all labs ordered are listed, but only abnormal results are displayed)  Labs Reviewed  CBC WITH DIFFERENTIAL/PLATELET - Abnormal; Notable for the following components:      Result Value   RBC 3.31 (*)    Hemoglobin 9.5 (*)    HCT 29.8 (*)    Neutro Abs 8.1 (*)    All other components within normal limits  COMPREHENSIVE METABOLIC PANEL - Abnormal; Notable for the following components:   Glucose, Bld 123 (*)    BUN 49 (*)    All other components within normal limits  SARS CORONAVIRUS 2 (HOSPITAL ORDER, [facility])  APTT  PROTIME-INR  TROPONIN I  TYPE AND SCREEN   ____________________________________________  EKG  None ____________________________________________  RADIOLOGY  Chest x-ray demonstrates pulmonary and osseous metastatic disease, unchanged ____________________________________________    PROCEDURES  Procedure(s) performed: No  Procedures   Critical Care performed: yes  CRITICAL CARE Performed by: Lavonia Drafts   Total critical care time: 35 minutes  Critical care time was exclusive of separately billable procedures and treating other patients.  Critical care was necessary to treat  or prevent imminent or life-threatening deterioration.  Critical care was time spent personally by me on the following activities: development of treatment plan with patient and/or surrogate as well as nursing, discussions with consultants, evaluation of patient's response to treatment, examination of patient, obtaining history from patient or surrogate, ordering and performing treatments and interventions, ordering and review of laboratory studies, ordering and review of radiographic studies, pulse oximetry and re-evaluation of patient's condition.  ____________________________________________   INITIAL IMPRESSION / ASSESSMENT AND PLAN / ED COURSE  Pertinent labs & imaging results that were available during my care of the patient were reviewed by me and considered in my medical decision making (see chart for details).  Patient presents with episodes of hematemesis, has a history of cancer, differential is broad, certainly this could be chemotherapy related gastritis, PUD, gastritis, esophagitis.  We will obtain type and screen, coags, place IV placed on cardiac monitor send stat CBC and reevaluate  Hemoglobin is 9.5, down 3 g from last week consistent with upper GI bleed.  He has not vomited here in the emergency department, no indication for transfusion at this time.  Will require admission to the hospitalist service further management, I have notified Dr.  Rogue Bussing and Dr. Tasia Catchings  Discussed with Dr. Brett Albino of the hospitalist service who will consult GI    ____________________________________________   FINAL CLINICAL IMPRESSION(S) / ED DIAGNOSES  Final diagnoses:  Acute upper GI  bleed        Note:  This document was prepared using Dragon voice recognition software and may include unintentional dictation errors.   Lavonia Drafts, MD 04/16/19 [phone]

## 2019-04-16 NOTE — ED Notes (Signed)
Pt states he was swabbed for covid at the cancer center one week ago and states his test was negative

## 2019-04-17 ENCOUNTER — Inpatient Hospital Stay: Payer: PPO

## 2019-04-17 DIAGNOSIS — K920 Hematemesis: Principal | ICD-10-CM

## 2019-04-17 LAB — BASIC METABOLIC PANEL
Anion gap: 13 (ref 5–15)
BUN: 57 mg/dL — ABNORMAL HIGH (ref 8–23)
CO2: 20 mmol/L — ABNORMAL LOW (ref 22–32)
Calcium: 8.7 mg/dL — ABNORMAL LOW (ref 8.9–10.3)
Chloride: 108 mmol/L (ref 98–111)
Creatinine, Ser: 1.12 mg/dL (ref 0.61–1.24)
GFR calc Af Amer: >60 mL/min (ref >60)
GFR calc non Af Amer: >60 mL/min (ref >60)
Glucose, Bld: 146 mg/dL — ABNORMAL HIGH (ref 70–99)
Potassium: 3.9 mmol/L (ref 3.5–5.1)
Sodium: 141 mmol/L (ref 135–145)

## 2019-04-17 LAB — CBC
HCT: 19.6 % — ABNORMAL LOW (ref 39.0–52.0)
Hemoglobin: 6.4 g/dL — ABNORMAL LOW (ref 13.0–17.0)
MCH: 29.9 pg (ref 26.0–34.0)
MCHC: 32.7 g/dL (ref 30.0–36.0)
MCV: 91.6 fL (ref 80.0–100.0)
Platelets: 159 10*3/uL (ref 150–400)
RBC: 2.14 MIL/uL — ABNORMAL LOW (ref 4.22–5.81)
RDW: 15.1 % (ref 11.5–15.5)
WBC: 10.6 10*3/uL — ABNORMAL HIGH (ref 4.0–10.5)
nRBC: 0 % (ref 0.0–0.2)

## 2019-04-17 LAB — HEMOGLOBIN AND HEMATOCRIT, BLOOD
HCT: 22.5 % — ABNORMAL LOW (ref 39.0–52.0)
Hemoglobin: 7.5 g/dL — ABNORMAL LOW (ref 13.0–17.0)

## 2019-04-17 LAB — PROTIME-INR
INR: 1.2 (ref 0.8–1.2)
Prothrombin Time: 15.3 seconds — ABNORMAL HIGH (ref 11.4–15.2)

## 2019-04-17 LAB — PREPARE RBC (CROSSMATCH)

## 2019-04-17 LAB — GLUCOSE, CAPILLARY
Glucose-Capillary: 118 mg/dL — ABNORMAL HIGH (ref 70–99)
Glucose-Capillary: 131 mg/dL — ABNORMAL HIGH (ref 70–99)
Glucose-Capillary: 111 mg/dL — ABNORMAL HIGH (ref 70–99)
Glucose-Capillary: 117 mg/dL — ABNORMAL HIGH (ref 70–99)

## 2019-04-17 LAB — AMMONIA: Ammonia: <9 umol/L — ABNORMAL LOW (ref 9–35)

## 2019-04-17 MED ORDER — ACETAMINOPHEN 325 MG PO TABS
650.0000 mg | ORAL_TABLET | Freq: Once | ORAL | Status: AC
  Administered 2019-04-17: 12:00:00 650 mg via ORAL
  Filled 2019-04-17: qty 2

## 2019-04-17 MED ORDER — SODIUM CHLORIDE 0.9% IV SOLUTION
Freq: Once | INTRAVENOUS | Status: AC
  Administered 2019-04-17: 17:00:00 via INTRAVENOUS

## 2019-04-17 MED ORDER — SODIUM CHLORIDE 0.9% IV SOLUTION
Freq: Once | INTRAVENOUS | Status: AC
  Administered 2019-04-17: 12:00:00 via INTRAVENOUS

## 2019-04-17 MED ORDER — FUROSEMIDE 10 MG/ML IJ SOLN
20.0000 mg | Freq: Once | INTRAMUSCULAR | Status: AC
  Administered 2019-04-17: 20 mg via INTRAVENOUS
  Filled 2019-04-17: qty 2

## 2019-04-17 MED ORDER — OXYCODONE HCL 5 MG PO TABS
5.0000 mg | ORAL_TABLET | Freq: Four times a day (QID) | ORAL | Status: DC | PRN
  Administered 2019-04-20 – 2019-04-21 (×2): 5 mg via ORAL
  Filled 2019-04-17 (×2): qty 1

## 2019-04-17 MED ORDER — DIPHENHYDRAMINE HCL 25 MG PO CAPS
25.0000 mg | ORAL_CAPSULE | Freq: Once | ORAL | Status: AC
  Administered 2019-04-17: 25 mg via ORAL
  Filled 2019-04-17: qty 1

## 2019-04-17 NOTE — Progress Notes (Signed)
Patient's hemoglobin 9.5 to 6.4. Notified Dr. Marcille Blanco on call, received verbal order for blood transfusion.

## 2019-04-17 NOTE — Consult Note (Signed)
Jonathon Bellows , MD [address] [place], [place], [place], [address] [address], [place], [place], [place], [address] Phone: [phone]  Fax: [phone]  Consultation  Referring Provider:   Dr Brett Albino Primary Care Physician:  Dion Body, MD Primary Gastroenterologist:  None          Reason for Consultation:    GI bleed   Date of Admission:  04/16/2019 Date of Consultation:  04/17/2019         HPI:   Glen Davis is a 78 y.o. male with a history of metastatic renal cell carcinoma (lung involved). Presented to the ER yesterday with hematemesis. Elevation of BUN/creatinine ratio. He is on Plavix.   Drop in Hb from baseline. He is confused AX0 x1 , sitter by his side, denies any complaints presently. Per sitter no episodes of vomiting seen .   CBC Latest Ref Rng & Units 04/17/2019 04/16/2019 04/08/2019  WBC 4.0 - 10.5 K/uL 10.6(H) 9.6 5.3  Hemoglobin 13.0 - 17.0 g/dL 6.4(L) 9.5(L) 12.5(L)  Hematocrit 39.0 - 52.0 % 19.6(L) 29.8(L) 38.5(L)  Platelets 150 - 400 K/uL 159 176 140(L)     Past Medical History:  Diagnosis Date   CAD (coronary artery disease)    Cancer ([facility])    left arm   COPD (chronic obstructive pulmonary disease) ([facility])    Hypertension    Kidney stone    Lung cancer ([place])    Melanoma ([place]) 01/24/2016   right shoulder   Thyroid nodule 02/02/2016   left BENIGN THYROID NODULE by FNA    Past Surgical History:  Procedure Laterality Date   arm surgery Left    arm   cardiac stents  2011   Angioplasty / Stenting Femoral-X2   COLONOSCOPY  04/01/12   CORONARY ANGIOPLASTY     EXCISION MELANOMA WITH SENTINEL LYMPH NODE BIOPSY Right 01/24/2016   Procedure: EXCISION MELANOMA Right Shoulder;  Surgeon: Robert Bellow, MD;  Location: [facility] ORS;  Service: General;  Laterality: Right;   LAPAROSCOPIC NEPHRECTOMY, HAND ASSISTED Left 04/24/2016   Procedure: HAND ASSISTED LAPAROSCOPIC NEPHRECTOMY;  Surgeon: Hollice Espy, MD;  Location: [facility] ORS;   Service: Urology;  Laterality: Left;   TIBIA IM NAIL INSERTION Left 02/11/2019   Procedure: INTRAMEDULLARY (IM) NAIL TIBIAL;  Surgeon: Lovell Sheehan, MD;  Location: [facility] ORS;  Service: Orthopedics;  Laterality: Left;   VIDEO ASSISTED THORACOSCOPY (VATS)/THOROCOTOMY Left 03/08/2016   Procedure: VIDEO ASSISTED THORACOSCOPY (VATS) with lung biopsy - Left ;  Surgeon: Robert Bellow, MD;  Location: [facility] ORS;  Service: General;  Laterality: Left;    Prior to Admission medications   Medication Sig Start Date End Date Taking? Authorizing Provider  aspirin EC 81 MG tablet Take 81 mg by mouth daily.   Yes [provider]  atorvastatin (LIPITOR) 40 MG tablet Take 1 tablet (40 mg total) by mouth daily. 02/23/19  Yes Cammie Sickle, MD  busPIRone (BUSPAR) 5 MG tablet Take 5 mg by mouth 2 (two) times daily. 01/14/19  Yes [provider]  cholecalciferol (VITAMIN D) 400 units TABS tablet Take 400 Units by mouth daily.   Yes [provider]  clopidogrel (PLAVIX) 75 MG tablet Take 75 mg by mouth daily.   Yes [provider]  DULoxetine (CYMBALTA) 30 MG capsule Take 2 capsules by mouth daily. 08/05/17 10/08/20 Yes [provider]  fentaNYL (DURAGESIC) 75 MCG/HR Place 1 patch onto the skin every 3 (three) days. 03/16/19  Yes Cammie Sickle, MD  levothyroxine (SYNTHROID) 75 MCG tablet Take 1 tablet (75 mcg total) by mouth daily before breakfast. 04/09/19  Yes Cammie Sickle, MD  metFORMIN (GLUCOPHAGE) 500 MG tablet TAKE 1 TABLET (500 MG TOTAL) BY MOUTH 2 (TWO) TIMES DAILY WITH A MEAL. 02/26/19  Yes Cammie Sickle, MD  Misc Natural Products (GLUCOSAMINE CHOND COMPLEX/MSM) TABS Take 2 tablets by mouth daily.   Yes [provider]  montelukast (SINGULAIR) 10 MG tablet TAKE 1 TABLET BY MOUTH EVERYDAY AT BEDTIME 04/06/19  Yes Cammie Sickle, MD  Omega-3 Fatty Acids (FISH OIL) 1000 MG CPDR Take 1 capsule by mouth daily.   Yes [provider]  oxyCODONE 10 MG TABS Take 1-2 tablets (10-20 mg total) by mouth every 4 (four) hours as needed for breakthrough pain. 04/10/19  Yes Verlon Au, NP  tamsulosin (FLOMAX) 0.4 MG CAPS capsule TAKE 1 CAPSULE (0.4 MG TOTAL) BY MOUTH EVERY MORNING. 07/03/18  Yes Verlon Au, NP  temazepam (RESTORIL) 7.5 MG capsule Take 1 capsule (7.5 mg total) by mouth at bedtime as needed for sleep. 02/23/19  Yes Cammie Sickle, MD  Tiotropium Bromide Monohydrate (SPIRIVA RESPIMAT) 1.25 MCG/ACT AERS Inhale 1.25 Act into the lungs 2 (two) times daily. 02/17/18  Yes Cammie Sickle, MD  tiZANidine (ZANAFLEX) 4 MG tablet Take 4 mg by mouth daily.  06/23/18  Yes [provider]  Turmeric 500 MG TABS Take 1 tablet by mouth daily.   Yes [provider]  albuterol (PROVENTIL HFA;VENTOLIN HFA) 108 (90 Base) MCG/ACT inhaler Inhale 2 puffs into the lungs every 6 (six) hours as needed for wheezing or shortness of breath. 02/17/18   Cammie Sickle, MD  Aspirin-Salicylamide-Caffeine (BC HEADACHE POWDER PO) Take 1 packet by mouth daily.    [provider]  CVS CITRATE OF MAGNESIA SOLN Take 15 mLs by mouth as needed for constipation. 02/17/19   [provider]  fluticasone (FLONASE) 50 MCG/ACT nasal spray Place 2 sprays into both nostrils daily. 12/27/17   Jacquelin Hawking, NP  ipratropium-albuterol (DUONEB) 0.5-2.5 (3) MG/3ML SOLN Take 3 mLs by nebulization every 4 (four) hours as needed. 04/04/17   Cammie Sickle, MD  loperamide (IMODIUM) 1 MG/5ML solution Take 4 mg by mouth as needed for diarrhea or loose stools.    [provider]  loratadine (CLARITIN) 10 MG tablet Take 10 mg by mouth daily.    [provider]  MELATONIN PO Take 10 mg by mouth at bedtime.     [provider]  nitroGLYCERIN (NITROSTAT) 0.4 MG SL tablet Place 1 tablet (0.4 mg total) under the tongue every 5 (five) minutes as needed. 03/16/19   Cammie Sickle, MD    OXYGEN Inhale 2 L into the lungs at bedtime.    [provider]    Family History  Problem Relation Age of Onset   Hodgkin's lymphoma Daughter 5   Heart attack Father    Kidney cancer Neg Hx    Kidney disease Neg Hx    Prostate cancer Neg Hx      Social History   Tobacco Use   Smoking status: Former Smoker    Packs/day: 2.00    Years: 30.00    Pack years: 60.00    Types: Cigarettes    Start date: 01/18/1956    Last attempt to quit: 01/17/1969    Years since quitting: 50.2   Smokeless tobacco: Never Used  Substance Use Topics   Alcohol use: Not Currently  Alcohol/week: 0.0 standard drinks    Comment: BEER OCC   Drug use: No    Allergies as of 04/16/2019 - Review Complete 04/16/2019  Allergen Reaction Noted   Lisinopril Swelling 01/11/2016   Other Itching 01/11/2016    Review of Systems:    Patient unable to provide ROS   Physical Exam:  Vital signs in last 24 hours: Temp:  [97.6 F (36.4 C)-98.1 F (36.7 C)] 97.8 F (36.6 C) (05/29 0414) Pulse Rate:  [95-111] 95 (05/29 0414) Resp:  [18-22] 22 (05/28 2339) BP: (119-140)/(73-97) 131/73 (05/29 0414) SpO2:  [97 %-100 %] 97 % (05/29 0414) Weight:  [79.4 kg] 79.4 kg (05/28 1610) Last BM Date: (unknown per patient) General:   Pleasant, cooperative  Head:  Normocephalic and atraumatic. Eyes:   No icterus.   Conjunctiva pink. PERRLA. Ears:  Normal auditory acuity. Neck:  Supple; no masses or thyroidomegaly Lungs: Respirations even and unlabored. Lungs clear to auscultation bilaterally.   No wheezes, crackles, or rhonchi.  Heart:  Regular rate and rhythm;  Without murmur, clicks, rubs or gallops Abdomen:  Soft, nondistended, nontender. Normal bowel sounds. No appreciable masses or hepatomegaly.  No rebound or guarding.  Neurologic:  Alert and oriented x1;  Moving all 4 limbs  Skin:  Intact without significant lesions or rashes. Cervical Nodes:  No significant cervical adenopathy. Psych:   Confused but calm  LAB RESULTS: Recent Labs    04/16/19 1616 04/17/19 0511  WBC 9.6 10.6*  HGB 9.5* 6.4*  HCT 29.8* 19.6*  PLT 176 159   BMET Recent Labs    04/16/19 1616 04/17/19 0511  NA 140 141  K 4.2 3.9  CL 105 108  CO2 22 20*  GLUCOSE 123* 146*  BUN 49* 57*  CREATININE 0.99 1.12  CALCIUM 8.9 8.7*   LFT Recent Labs    04/16/19 1616  PROT 6.7  ALBUMIN 3.9  AST 17  ALT 12  ALKPHOS 63  BILITOT 0.6   PT/INR Recent Labs    04/16/19 1616 04/17/19 0511  LABPROT 14.3 15.3*  INR 1.1 1.2    STUDIES: Mr Shoulder Left W Wo Contrast  Result Date: 04/15/2019 CLINICAL DATA:  Left shoulder pain. Fell 6 weeks ago and fractured glenoid. Persistent pain and limited range of motion. History of renal cell cancer and lung cancer. EXAM: MRI OF THE LEFT SHOULDER WITHOUT AND WITH CONTRAST TECHNIQUE: Multiplanar, multisequence MR imaging of the left shoulder was performed before and after the administration of intravenous contrast. CONTRAST:  8 cc Gadavist COMPARISON:  CT shoulder 10/20/2018 and CT chest, abdomen and pelvis 03/30/2019 FINDINGS: Rotator cuff: Moderate rotator cuff tendinopathy/tendinosis but no full-thickness retracted rotator cuff tear. Muscles: Marked edema like signal abnormality in the supraspinatus and infraspinatus muscles could suggest a muscle strain, myositis or radiation change. No discrete muscle mass. Biceps long head:  Intact. Acromioclavicular Joint: Chronic AC joint degenerative changes and mild subluxation of the joint. Edema like signal abnormality in the distal clavicle and acromion likely on going stress related degenerative changes. There is also a small joint effusion and mild pannus formation. Type 2 acromion. No significant lateral downsloping or undersurface spurring. Glenohumeral Joint: Small joint effusion and moderate synovitis. Moderate degenerative changes. Labrum:  The superior labrum is torn and partially detached. Bones: Large enhancing  metastatic lesion involving the glenoid with a chronic ununited pathologic fracture. This measures approximately 3.2 x 2.8 cm. No other obvious metastatic bone lesions. Other: Moderate subacromial/subdeltoid bursitis. IMPRESSION: 1. 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 2. Rotator cuff tendinopathy/tendinosis but no full-thickness rotator cuff tear. 3. Torn and detached superior labrum. 4. Chronic AC joint degenerative changes and glenohumeral joint degenerative changes. Electronically Signed   By: Marijo Sanes M.D.   On: 04/15/2019 13:47   Dg Chest Port 1 View  Result Date: 04/16/2019 CLINICAL DATA:  History of lung cancer. EXAM: PORTABLE CHEST 1 VIEW COMPARISON:  Chest CT 03/30/2019 FINDINGS: The cardiac silhouette, mediastinal and hilar contours are within normal limits and stable. Multiple vague scattered pulmonary lesions consistent with known pulmonary metastatic disease. No acute overlying pulmonary findings or pleural effusion. Left glenoid bone lesion again noted. IMPRESSION: Pulmonary and osseous metastatic disease but no acute overlying pulmonary process. Electronically Signed   By: Marijo Sanes M.D.   On: 04/16/2019 16:32      Impression / Plan:   Glen Davis is a 78 y.o. y/o male with a history of metastatic renal cell carcinoma admitted with hematemesis. He is on Asprin and plavix which has been held since admission last night . Elevated BUN/Cr ratio. Hb dropped again this am and is having a blood transfusion.   Plan  1. Monitor CBC and transfuse as needed 2. Since has been on Plavix will need to be off for 5 days to perform procedure. IF having ongoing active bleeding then may need to perform sooner.  3. PPI 4. He is presently confused and is undergoing evaluation.   Thank you for involving me in the care of this patient.      LOS: 1 day   Jonathon Bellows, MD  04/17/2019, 9:45 AM

## 2019-04-17 NOTE — Progress Notes (Signed)
[place] [facility] [place] NAME: Rockey Guarino    MR#:  [id number]  DATE OF BIRTH:  05/30/1941  SUBJECTIVE:  Patient is mentally altered per nursing staff on morning, restless, requiring a sitter, patient is confused and disoriented without motor deficits, no speech abnormalities, brightly awake and alert, will check encephalopathy work-up, CT head  REVIEW OF SYSTEMS:  CONSTITUTIONAL: No fever, fatigue or weakness.  EYES: No blurred or double vision.  EARS, NOSE, AND THROAT: No tinnitus or ear pain.  RESPIRATORY: No cough, shortness of breath, wheezing or hemoptysis.  CARDIOVASCULAR: No chest pain, orthopnea, edema.  GASTROINTESTINAL: No nausea, vomiting, diarrhea or abdominal pain.  GENITOURINARY: No dysuria, hematuria.  ENDOCRINE: No polyuria, nocturia,  HEMATOLOGY: No anemia, easy bruising or bleeding SKIN: No rash or lesion. MUSCULOSKELETAL: No joint pain or arthritis.   NEUROLOGIC: No tingling, numbness, weakness.  PSYCHIATRY: No anxiety or depression.   ROS  DRUG ALLERGIES:   Allergies  Allergen Reactions  . Lisinopril Swelling  . Other Itching    Nitroglycerin Patch.    VITALS:  Blood pressure 131/73, pulse 95, temperature 97.8 F (36.6 C), temperature source Oral, resp. rate (!) 22, height 6\' 1"  (1.854 m), weight 79.4 kg, SpO2 97 %.  PHYSICAL EXAMINATION:  GENERAL:  78 y.o.-year-old patient lying in the bed with no acute distress.  EYES: Pupils equal, round, reactive to light and accommodation. No scleral icterus. Extraocular muscles intact.  HEENT: Head atraumatic, normocephalic. Oropharynx and nasopharynx clear.  NECK:  Supple, no jugular venous distention. No thyroid enlargement, no tenderness.  LUNGS: Normal breath sounds bilaterally, no wheezing, rales,rhonchi or crepitation. No use of accessory muscles of respiration.  CARDIOVASCULAR: S1, S2 normal. No murmurs, rubs, or gallops.  ABDOMEN: Soft, nontender, nondistended. Bowel  sounds present. No organomegaly or mass.  EXTREMITIES: No pedal edema, cyanosis, or clubbing.  NEUROLOGIC: Cranial nerves II through XII are intact. MAES, confused, disoriented, no motor deficits. Gait not checked.  PSYCHIATRIC: The patient is alert and oriented x 1, cooperative.  SKIN: No obvious rash, lesion, or ulcer.   Physical Exam LABORATORY PANEL:   CBC Recent Labs  Lab 04/17/19 0511  WBC 10.6*  HGB 6.4*  HCT 19.6*  PLT 159   ------------------------------------------------------------------------------------------------------------------  Chemistries  Recent Labs  Lab 04/16/19 1616 04/17/19 0511  NA 140 141  K 4.2 3.9  CL 105 108  CO2 22 20*  GLUCOSE 123* 146*  BUN 49* 57*  CREATININE 0.99 1.12  CALCIUM 8.9 8.7*  AST 17  --   ALT 12  --   ALKPHOS 63  --   BILITOT 0.6  --    ------------------------------------------------------------------------------------------------------------------  Cardiac Enzymes Recent Labs  Lab 04/16/19 1616  TROPONINI <0.03   ------------------------------------------------------------------------------------------------------------------  RADIOLOGY:  Dg Chest Port 1 View  Result Date: 04/16/2019 CLINICAL DATA:  History of lung cancer. EXAM: PORTABLE CHEST 1 VIEW COMPARISON:  Chest CT 03/30/2019 FINDINGS: The cardiac silhouette, mediastinal and hilar contours are within normal limits and stable. Multiple vague scattered pulmonary lesions consistent with known pulmonary metastatic disease. No acute overlying pulmonary findings or pleural effusion. Left glenoid bone lesion again noted. IMPRESSION: Pulmonary and osseous metastatic disease but no acute overlying pulmonary process. Electronically Signed   By: Marijo Sanes M.D.   On: 04/16/2019 16:32    ASSESSMENT AND PLAN:  *Acute hematemesis  Etiology unknown  Continue Protonix drip, gastroenterology to see, continue to hold aspirin/Plavix, n.p.o. except for meds   *Acute  blood loss  anemia  Secondary to GI bleeding Worsening noted, hemoglobin 6.4 down from 12  Transfuse another 2 units packed red blood cells, H&H every 6 hours, CBC daily, transfuse as needed   *Acute toxic metabolic encephalopathy Exact etiology unknown - ?  Polypharmacy as etiology Check urine drug screen, stat CT head without contrast, ammonia level, RPR, neurochecks per routine, aspiration/fall precautions, hold psychotropic meds, reduce opioids  *Chronic metastatic renal cell carcinoma follows with Dr. Rogue Bussing as an outpatient.  *Chronic diabetes mellitus type 2  Stable on current regimen   *COPD w/o exacerbation  Breathing treatments PRN   *Chronic hypothyroidism  Check TSH and continue Synthroid   *Hyperlipidemia, unspecified Continue home Lipitor  *Depression/anxiety stable Hold BuSpar, Cymbalta, and temazepam given encephalopathy  DVT prophylaxis with SCDs Disposition pending clinical course  All the records are reviewed and case discussed with Care Management/Social Workerr. Management plans discussed with the patient, family and they are in agreement.  CODE STATUS: full  TOTAL TIME TAKING CARE OF THIS PATIENT: 35 minutes.     POSSIBLE D/C IN 1-3 DAYS, DEPENDING ON CLINICAL CONDITION.   Avel Peace Dylann Gallier M.D on 04/17/2019   Between 7am to 6pm - Pager - [phone]  After 6pm go to [email or web address] - password [email or web address] [facility]  Office  [phone]  CC: Primary care physician; Dion Body, MD  Note: This dictation was prepared with Dragon dictation along with smaller phrase technology. Any transcriptional errors that result from this process are unintentional.

## 2019-04-17 NOTE — Plan of Care (Signed)

## 2019-04-18 LAB — BPAM RBC
Blood Product Expiration Date: 202006272359
Blood Product Expiration Date: 202006272359
ISSUE DATE / TIME: 202005291221
ISSUE DATE / TIME: 202005291646
Unit Type and Rh: 5100
Unit Type and Rh: 5100

## 2019-04-18 LAB — BASIC METABOLIC PANEL
Anion gap: 5 (ref 5–15)
BUN: 44 mg/dL — ABNORMAL HIGH (ref 8–23)
CO2: 25 mmol/L (ref 22–32)
Calcium: 8.2 mg/dL — ABNORMAL LOW (ref 8.9–10.3)
Chloride: 112 mmol/L — ABNORMAL HIGH (ref 98–111)
Creatinine, Ser: 0.98 mg/dL (ref 0.61–1.24)
GFR calc Af Amer: >60 mL/min (ref >60)
GFR calc non Af Amer: >60 mL/min (ref >60)
Glucose, Bld: 120 mg/dL — ABNORMAL HIGH (ref 70–99)
Potassium: 3.7 mmol/L (ref 3.5–5.1)
Sodium: 142 mmol/L (ref 135–145)

## 2019-04-18 LAB — TYPE AND SCREEN
ABO/RH(D): O POS
Antibody Screen: NEGATIVE
Unit division: 0
Unit division: 0

## 2019-04-18 LAB — URINE DRUG SCREEN, QUALITATIVE (ARMC ONLY)
Amphetamines, Ur Screen: NOT DETECTED
Barbiturates, Ur Screen: NOT DETECTED
Benzodiazepine, Ur Scrn: POSITIVE — AB
Cannabinoid 50 Ng, Ur ~~LOC~~: NOT DETECTED
Cocaine Metabolite,Ur ~~LOC~~: NOT DETECTED
MDMA (Ecstasy)Ur Screen: NOT DETECTED
Methadone Scn, Ur: NOT DETECTED
Opiate, Ur Screen: NOT DETECTED
Phencyclidine (PCP) Ur S: NOT DETECTED
Tricyclic, Ur Screen: NOT DETECTED

## 2019-04-18 LAB — CBC
HCT: 23.1 % — ABNORMAL LOW (ref 39.0–52.0)
Hemoglobin: 7.5 g/dL — ABNORMAL LOW (ref 13.0–17.0)
MCH: 29.6 pg (ref 26.0–34.0)
MCHC: 32.5 g/dL (ref 30.0–36.0)
MCV: 91.3 fL (ref 80.0–100.0)
Platelets: 126 10*3/uL — ABNORMAL LOW (ref 150–400)
RBC: 2.53 MIL/uL — ABNORMAL LOW (ref 4.22–5.81)
RDW: 15.5 % (ref 11.5–15.5)
WBC: 6.2 10*3/uL (ref 4.0–10.5)
nRBC: 0 % (ref 0.0–0.2)

## 2019-04-18 LAB — HEMOGLOBIN AND HEMATOCRIT, BLOOD
HCT: 24.7 % — ABNORMAL LOW (ref 39.0–52.0)
Hemoglobin: 8.1 g/dL — ABNORMAL LOW (ref 13.0–17.0)

## 2019-04-18 LAB — RPR: RPR Ser Ql: NONREACTIVE

## 2019-04-18 LAB — GLUCOSE, CAPILLARY
Glucose-Capillary: 104 mg/dL — ABNORMAL HIGH (ref 70–99)
Glucose-Capillary: 114 mg/dL — ABNORMAL HIGH (ref 70–99)
Glucose-Capillary: 96 mg/dL (ref 70–99)
Glucose-Capillary: 108 mg/dL — ABNORMAL HIGH (ref 70–99)

## 2019-04-18 MED ORDER — ORAL CARE MOUTH RINSE
15.0000 mL | Freq: Two times a day (BID) | OROMUCOSAL | Status: DC
  Administered 2019-04-18: 17:00:00 15 mL via OROMUCOSAL

## 2019-04-18 MED ORDER — CHLORHEXIDINE GLUCONATE 0.12 % MT SOLN
15.0000 mL | Freq: Two times a day (BID) | OROMUCOSAL | Status: DC
  Administered 2019-04-18 – 2019-04-20 (×4): 15 mL via OROMUCOSAL
  Filled 2019-04-18 (×5): qty 15

## 2019-04-18 NOTE — Plan of Care (Signed)

## 2019-04-18 NOTE — TOC Initial Note (Signed)
Transition of Care [facility]) - Initial/Assessment Note    Patient Details  Name: Glen Davis MRN: [id number] Date of Birth: July 17, 1941  [facility]) CM/SW Contact:    Latanya Maudlin, RN Phone Number: 04/18/2019, 9:15 AM  Clinical Narrative:  [facility] team consulted to complete high risk readmission assessment. Patient acutely confused which is not his baseline. Most initial assessment was completed by chart review and conversation with medical team. Patient does live with his spouse. Patient is undergoing treatments at cancer center. There is question about mentation changes possibly related to narcotic management/polypharmacy. Drug screen was unremarkable expect for assumably presrcibed opiates. Patient currently has Air cabin crew at bedside. Patient has rolling walker and bedside commode in the home. Recently was closed with [facility]. Unclear if home health would be warranted. Patients hemoglobin has been critically low. GI following and possibly will scope pending plavix washout. PCP is [facility].   TOC team will continue to follow.                   Expected Discharge Plan: Home/Self Care Barriers to Discharge: Continued Medical Work up   Patient Goals and CMS Choice        Expected Discharge Plan and Services Expected Discharge Plan: Home/Self Care   Discharge Planning Services: CM Consult   Living arrangements for the past 2 months: Single Family Home                                      Prior Living Arrangements/Services Living arrangements for the past 2 months: Single Family Home Lives with:: Spouse              Current home services: DME    Activities of Daily Living Home Assistive Devices/Equipment: None ADL Screening (condition at time of admission) Patient's cognitive ability adequate to safely complete daily activities?: Yes Is the patient deaf or have difficulty hearing?: No Does the patient have difficulty seeing, even when  wearing glasses/contacts?: No Does the patient have difficulty concentrating, remembering, or making decisions?: No Patient able to express need for assistance with ADLs?: Yes Does the patient have difficulty dressing or bathing?: Yes Independently performs ADLs?: Yes (appropriate for developmental age) Does the patient have difficulty walking or climbing stairs?: No Weakness of Legs: None Weakness of Arms/Hands: None  Permission Sought/Granted                  Emotional Assessment Appearance:: Appears stated age Attitude/Demeanor/Rapport: Unable to Assess   Orientation: : Oriented to Self      Admission diagnosis:  Acute upper GI bleed [K92.2] Patient Active Problem List   Diagnosis Date Noted  . Hematemesis 04/16/2019  . Unsp fracture of upper end of unsp tibia, init for clos fx 02/10/2019  . Cancer, metastatic to bone ([place]) 05/18/2018  . Orthostatic hypotension 03/24/2018  . Acute sinusitis 02/06/2017  . Cough 10/09/2016  . SOB (shortness of breath) 09/13/2016  . Syncope 08/17/2016  . Situational anxiety 06/05/2016  . Cancer of left kidney excluding renal pelvis ([place]) 06-21-2016  . 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 Coronary artery disease involving native coronary artery of native heart without angina pectoris 03/16/2016  . Essential (primary) hypertension 03/16/2016  . Hypertriglyceridemia 03/16/2016  . Left thyroid nodule 02/03/2016  . Pulmonary nodules/lesions, multiple 02/03/2016  . Counseling regarding goals of care 01/30/2016  . Vaccine counseling 01/30/2016  .  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 Melanoma of skin ([place]) 01/12/2016  . Breathlessness on exertion 04/25/2015   PCP:  Dion Body, MD Pharmacy:   [id number] - [place], [place] - [address] [place] [address] Phone: [phone] Fax: [phone]  [place], [place] - [address] [place] [address] Phone: [phone] Fax: [phone]     Social Determinants of Health (SDOH) Interventions    Readmission Risk Interventions Readmission Risk Prevention Plan 04/18/2019  [place] or Home Care Consult Complete  Medication Review (RN Care Manager) Complete  Some recent data might be hidden

## 2019-04-18 NOTE — Progress Notes (Signed)
[place] at [place] NAME: Glen Davis    MR#:  [id number]  DATE OF BIRTH:  05-Aug-1941  SUBJECTIVE:   Pt. admitted to the hospital due to hematemesis and suspected upper GI bleed.  No acute bleeding overnight.  Mental status much improved since yesterday.  REVIEW OF SYSTEMS:    Review of Systems  Constitutional: Negative for chills and fever.  HENT: Negative for congestion and tinnitus.   Eyes: Negative for blurred vision and double vision.  Respiratory: Negative for cough, shortness of breath and wheezing.   Cardiovascular: Negative for chest pain, orthopnea and PND.  Gastrointestinal: Negative for abdominal pain, diarrhea, nausea and vomiting.  Genitourinary: Negative for dysuria and hematuria.  Neurological: Negative for dizziness, sensory change and focal weakness.  All other systems reviewed and are negative.   Nutrition: clear liquid Tolerating Diet: yes Tolerating PT: Ambulatory  DRUG ALLERGIES:   Allergies  Allergen Reactions  . Lisinopril Swelling  . Other Itching    Nitroglycerin Patch.    VITALS:  Blood pressure 103/73, pulse 63, temperature 98.5 F (36.9 C), temperature source Oral, resp. rate 18, height 6\' 1"  (1.854 m), weight 79.4 kg, SpO2 97 %.  PHYSICAL EXAMINATION:   Physical Exam  GENERAL:  78 y.o.-year-old patient lying in bed in no acute distress.  EYES: Pupils equal, round, reactive to light and accommodation. No scleral icterus. Extraocular muscles intact.  HEENT: Head atraumatic, normocephalic. Oropharynx and nasopharynx clear.  NECK:  Supple, no jugular venous distention. No thyroid enlargement, no tenderness.  LUNGS: Normal breath sounds bilaterally, no wheezing, rales, rhonchi. No use of accessory muscles of respiration.  CARDIOVASCULAR: S1, S2 normal. No murmurs, rubs, or gallops.  ABDOMEN: Soft, nontender, nondistended. Bowel sounds present. No organomegaly or mass.  EXTREMITIES: No cyanosis,  clubbing or edema b/l.    NEUROLOGIC: Cranial nerves II through XII are intact. No focal Motor or sensory deficits b/l.   PSYCHIATRIC: The patient is alert and oriented x 3.  SKIN: No obvious rash, lesion, or ulcer.    LABORATORY PANEL:   CBC Recent Labs  Lab 04/18/19 0412 04/18/19 0844  WBC 6.2  --   HGB 7.5* 8.1*  HCT 23.1* 24.7*  PLT 126*  --    ------------------------------------------------------------------------------------------------------------------  Chemistries  Recent Labs  Lab 04/16/19 1616  04/18/19 0412  NA 140   < > 142  K 4.2   < > 3.7  CL 105   < > 112*  CO2 22   < > 25  GLUCOSE 123*   < > 120*  BUN 49*   < > 44*  CREATININE 0.99   < > 0.98  CALCIUM 8.9   < > 8.2*  AST 17  --   --   ALT 12  --   --   ALKPHOS 63  --   --   BILITOT 0.6  --   --    < > = values in this interval not displayed.   ------------------------------------------------------------------------------------------------------------------  Cardiac Enzymes Recent Labs  Lab 04/16/19 1616  TROPONINI <0.03   ------------------------------------------------------------------------------------------------------------------  RADIOLOGY:  Ct Head Wo Contrast  Result Date: 04/17/2019 CLINICAL DATA:  Altered mental status EXAM: CT HEAD WITHOUT CONTRAST TECHNIQUE: Contiguous axial images were obtained from the base of the skull through the vertex without intravenous contrast. COMPARISON:  04/21/2018 FINDINGS: Brain: There is no mass, hemorrhage or extra-axial collection. The appearance of the white matter is normal for the patient's age. There  is mild generalized volume loss. Vascular: No abnormal hyperdensity of the major intracranial arteries or dural venous sinuses. No intracranial atherosclerosis. Skull: The visualized skull base, calvarium and extracranial soft tissues are normal. Sinuses/Orbits: No fluid levels or advanced mucosal thickening of the visualized paranasal sinuses. No  mastoid or middle ear effusion. The orbits are normal. IMPRESSION: No acute intracranial abnormality. Electronically Signed   By: Ulyses Jarred M.D.   On: 04/17/2019 22:45   Dg Chest Port 1 View  Result Date: 04/16/2019 CLINICAL DATA:  History of lung cancer. EXAM: PORTABLE CHEST 1 VIEW COMPARISON:  Chest CT 03/30/2019 FINDINGS: The cardiac silhouette, mediastinal and hilar contours are within normal limits and stable. Multiple vague scattered pulmonary lesions consistent with known pulmonary metastatic disease. No acute overlying pulmonary findings or pleural effusion. Left glenoid bone lesion again noted. IMPRESSION: Pulmonary and osseous metastatic disease but no acute overlying pulmonary process. Electronically Signed   By: Marijo Sanes M.D.   On: 04/16/2019 16:32     ASSESSMENT AND PLAN:   79 year old male with past medical history of COPD, lung cancer, coronary artery disease, alcohol abuse who presents to the hospital due to hematemesis.  1.  GI bleed-patient presented to the hospital due to acute hematemesis. - Hemoglobin was down to 6.4 but improved with transfusion is currently stable.  Patient has a previous history of alcohol abuse but no liver cirrhosis.  Continue Protonix drip, patient is on Plavix which is currently on hold. - appreciate GI consult and plan for possible endoscopy next week.  2.  Acute blood loss anemia-secondary to the GI bleed. -Patient presented to the hospital hemoglobin down to 6.4 from baseline around 12. - Patient has been transfused 2 units of packed red blood cells.  Hemoglobin improved posttransfusion.  Currently stable 8.4.  3.  Altered mental status/encephalopathy-likely metabolic encephalopathy secondary to polypharmacy. - CT head was negative for acute pathology, ammonia level was normal.  Urine drug screen was also negative. - Mental status much improved and close to baseline.  4.  Chronic metastatic renal cell carcinoma-patient follows with Dr.  Rogue Bussing. -Continue outpatient follow-up.  5.  Diabetes type 2 without complication- cont. sliding scale insulin.  6. Hypothyroidism - cont. Synthroid.   7. Hyperlipidemia - cont. Atorvastatin.    All the records are reviewed and case discussed with Care Management/Social Worker. Management plans discussed with the patient, family and they are in agreement.  CODE STATUS: Full code  DVT Prophylaxis: Ted's & SCD's.   TOTAL TIME TAKING CARE OF THIS PATIENT: 30 minutes.   POSSIBLE D/C IN 2-3 DAYS, DEPENDING ON CLINICAL CONDITION.   Henreitta Leber M.D on 04/18/2019 at 2:00 PM  Between 7am to 6pm - Pager - [phone]  After 6pm go to [email or web address] - Proofreader  [facility]  Office  [phone]  CC: Primary care physician; Dion Body, MD

## 2019-04-18 NOTE — Progress Notes (Signed)
[facility]astro[facility]ology Inpatient Progress Note  Subjective: Patient seen for f/u hemetemesis, anemia from blood loss. NAD. Denies further episodes of emesis. No pain.  Objective: Vital signs in last 24 hours: Temp:  [97.9 F (36.6 C)-98.5 F (36.9 C)] 98.1 F (36.7 C) (05/30 1644) Pulse Rate:  [63-68] 68 (05/30 1644) Resp:  [14-18] 14 (05/30 1644) BP: (103-137)/(73-78) 114/78 (05/30 1644) SpO2:  [95 %-100 %] 95 % (05/30 1644) Blood pressure 114/78, pulse 68, temperature 98.1 F (36.7 C), resp. rate 14, height 6\' 1"  (1.854 m), weight 79.4 kg, SpO2 95 %.    Intake/Output from previous day: 05/29 0701 - 05/30 0700 In: 1691.3 [I.V.:1001.3; Blood:690] Out: 100 [Urine:100]  Intake/Output this shift: Total I/O In: 1545.7 [P.O.:990; I.V.:555.7] Out: 200 [Urine:200]   General appearance: Alert, oriented x 3. Resp:  CTA Cardio: RRR GI:  Soft, benign. BS+ Extremities: No edema.   Lab Results: Results for orders placed or performed during the hospital encounter of 04/16/19 (from the past 24 hour(s))  Hemoglobin and hematocrit, blood     Status: Abnormal   Collection Time: 04/17/19  9:01 PM  Result Value Ref Range   Hemoglobin 7.5 (L) 13.0 - 17.0 g/dL   HCT 22.5 (L) 39.0 - 52.0 %  Glucose, capillary     Status: Abnormal   Collection Time: 04/17/19  9:52 PM  Result Value Ref Range   Glucose-Capillary 117 (H) 70 - 99 mg/dL  CBC     Status: Abnormal   Collection Time: 04/18/19  4:12 AM  Result Value Ref Range   WBC 6.2 4.0 - 10.5 K/uL   RBC 2.53 (L) 4.22 - 5.81 MIL/uL   Hemoglobin 7.5 (L) 13.0 - 17.0 g/dL   HCT 23.1 (L) 39.0 - 52.0 %   MCV 91.3 80.0 - 100.0 fL   MCH 29.6 26.0 - 34.0 pg   MCHC 32.5 30.0 - 36.0 g/dL   RDW 15.5 11.5 - 15.5 %   Platelets 126 (L) 150 - 400 K/uL   nRBC 0.0 0.0 - 0.2 %  Basic metabolic panel     Status: Abnormal   Collection Time: 04/18/19  4:12 AM  Result Value Ref Range   Sodium 142 135 - 145 mmol/L   Potassium 3.7 3.5 - 5.1  mmol/L   Chloride 112 (H) 98 - 111 mmol/L   CO2 25 22 - 32 mmol/L   Glucose, Bld 120 (H) 70 - 99 mg/dL   BUN 44 (H) 8 - 23 mg/dL   Creatinine, Ser 0.98 0.61 - 1.24 mg/dL   Calcium 8.2 (L) 8.9 - 10.3 mg/dL   GFR calc non Af Amer >60 >60 mL/min   GFR calc Af Amer >60 >60 mL/min   Anion gap 5 5 - 15  Glucose, capillary     Status: Abnormal   Collection Time: 04/18/19  7:50 AM  Result Value Ref Range   Glucose-Capillary 104 (H) 70 - 99 mg/dL  Hemoglobin and hematocrit, blood     Status: Abnormal   Collection Time: 04/18/19  8:44 AM  Result Value Ref Range   Hemoglobin 8.1 (L) 13.0 - 17.0 g/dL   HCT 24.7 (L) 39.0 - 52.0 %  Glucose, capillary     Status: Abnormal   Collection Time: 04/18/19 11:49 AM  Result Value Ref Range   Glucose-Capillary 114 (H) 70 - 99 mg/dL  Glucose, capillary     Status: None   Collection Time: 04/18/19  4:45 PM  Result Value Ref Range   Glucose-Capillary  96 70 - 99 mg/dL     Recent Labs    04/16/19 1616 04/17/19 0511 04/17/19 2101 04/18/19 0412 04/18/19 0844  WBC 9.6 10.6*  --  6.2  --   HGB 9.5* 6.4* 7.5* 7.5* 8.1*  HCT 29.8* 19.6* 22.5* 23.1* 24.7*  PLT 176 159  --  126*  --    BMET Recent Labs    04/16/19 1616 04/17/19 0511 04/18/19 0412  NA 140 141 142  K 4.2 3.9 3.7  CL 105 108 112*  CO2 22 20* 25  GLUCOSE 123* 146* 120*  BUN 49* 57* 44*  CREATININE 0.99 1.12 0.98  CALCIUM 8.9 8.7* 8.2*   LFT Recent Labs    04/16/19 1616  PROT 6.7  ALBUMIN 3.9  AST 17  ALT 12  ALKPHOS 63  BILITOT 0.6   PT/INR Recent Labs    04/16/19 1616 04/17/19 0511  LABPROT 14.3 15.3*  INR 1.1 1.2   Hepatitis Panel No results for input(s): HEPBSAG, HCVAB, HEPAIGM, HEPBIGM in the last 72 hours. C-Diff No results for input(s): CDIFFTOX in the last 72 hours. No results for input(s): CDIFFPCR in the last 72 hours.   Studies/Results: Ct Head Wo Contrast  Result Date: 04/17/2019 CLINICAL DATA:  Altered mental status EXAM: CT HEAD WITHOUT  CONTRAST TECHNIQUE: Contiguous axial images were obtained from the base of the skull through the vertex without intravenous contrast. COMPARISON:  04/21/2018 FINDINGS: Brain: There is no mass, hemorrhage or extra-axial collection. The appearance of the white matter is normal for the patient's age. There is mild generalized volume loss. Vascular: No abnormal hyperdensity of the major intracranial arteries or dural venous sinuses. No intracranial atherosclerosis. Skull: The visualized skull base, calvarium and extracranial soft tissues are normal. Sinuses/Orbits: No fluid levels or advanced mucosal thickening of the visualized paranasal sinuses. No mastoid or middle ear effusion. The orbits are normal. IMPRESSION: No acute intracranial abnormality. Electronically Signed   By: Ulyses Jarred M.D.   On: 04/17/2019 22:45    Scheduled Inpatient Medications:   . atorvastatin  40 mg Oral Daily  . chlorhexidine  15 mL Mouth Rinse BID  . cholecalciferol  400 Units Oral Daily  . fentaNYL  1 patch Transdermal Q72H  . insulin aspart  0-5 Units Subcutaneous QHS  . insulin aspart  0-9 Units Subcutaneous TID WC  . levothyroxine  75 mcg Oral QAC breakfast  . mouth rinse  15 mL Mouth Rinse q12n4p  . montelukast  10 mg Oral QHS  . pantoprazole (PROTONIX) IV  40 mg Intravenous Q12H  . tamsulosin  0.4 mg Oral BH-q7a  . umeclidinium bromide  1 puff Inhalation Daily    Continuous Inpatient Infusions:   . sodium chloride 75 mL/hr at 04/18/19 1537    PRN Inpatient Medications:  acetaminophen **OR** acetaminophen, albuterol, fluticasone, ipratropium-albuterol, loratadine, ondansetron **OR** ondansetron (ZOFRAN) IV, oxyCODONE, polyethylene glycol  Miscellaneous: N/A  Assessment:  1. Hemetemesis - Stable.  2. Anemia secondary to acute GI blood loss, hgb improved to 8.1 after 2 u prbc transfusion. 3. Metatstatic renal cell carcinoma 4. Acute metabolic encephalopathy - Resolved. 5. Type II DM  Plan:  1.  Continue diet. 2. EGD early next week after Plavix held x 5 days. 3. Following progress.   Teodoro K. Alice Reichert, M.D. 04/18/2019, 6:26 PM

## 2019-04-19 LAB — CBC
HCT: 22.5 % — ABNORMAL LOW (ref 39.0–52.0)
Hemoglobin: 7.1 g/dL — ABNORMAL LOW (ref 13.0–17.0)
MCH: 29.7 pg (ref 26.0–34.0)
MCHC: 31.6 g/dL (ref 30.0–36.0)
MCV: 94.1 fL (ref 80.0–100.0)
Platelets: 128 10*3/uL — ABNORMAL LOW (ref 150–400)
RBC: 2.39 MIL/uL — ABNORMAL LOW (ref 4.22–5.81)
RDW: 15.9 % — ABNORMAL HIGH (ref 11.5–15.5)
WBC: 4.9 10*3/uL (ref 4.0–10.5)
nRBC: 0 % (ref 0.0–0.2)

## 2019-04-19 LAB — BASIC METABOLIC PANEL
Anion gap: 5 (ref 5–15)
BUN: 23 mg/dL (ref 8–23)
CO2: 25 mmol/L (ref 22–32)
Calcium: 7.8 mg/dL — ABNORMAL LOW (ref 8.9–10.3)
Chloride: 112 mmol/L — ABNORMAL HIGH (ref 98–111)
Creatinine, Ser: 0.88 mg/dL (ref 0.61–1.24)
GFR calc Af Amer: >60 mL/min (ref >60)
GFR calc non Af Amer: >60 mL/min (ref >60)
Glucose, Bld: 108 mg/dL — ABNORMAL HIGH (ref 70–99)
Potassium: 3.5 mmol/L (ref 3.5–5.1)
Sodium: 142 mmol/L (ref 135–145)

## 2019-04-19 LAB — GLUCOSE, CAPILLARY
Glucose-Capillary: 110 mg/dL — ABNORMAL HIGH (ref 70–99)
Glucose-Capillary: 120 mg/dL — ABNORMAL HIGH (ref 70–99)
Glucose-Capillary: 87 mg/dL (ref 70–99)
Glucose-Capillary: 112 mg/dL — ABNORMAL HIGH (ref 70–99)

## 2019-04-19 LAB — HEMOGLOBIN: Hemoglobin: 8 g/dL — ABNORMAL LOW (ref 13.0–17.0)

## 2019-04-19 NOTE — Progress Notes (Signed)
[facility] [place] NAME: Glen Davis    MR#:  [id number]  DATE OF BIRTH:  [birth year]  SUBJECTIVE:   No further hematemesis overnight.  Hemoglobin was down to 7.1 but repeat this afternoon increased to 8.0.  This was likely hemo-delusional.  Patient denies any complaints.  Plan for endoscopy next week.  REVIEW OF SYSTEMS:    Review of Systems  Constitutional: Negative for chills and fever.  HENT: Negative for congestion and tinnitus.   Eyes: Negative for blurred vision and double vision.  Respiratory: Negative for cough, shortness of breath and wheezing.   Cardiovascular: Negative for chest pain, orthopnea and PND.  Gastrointestinal: Negative for abdominal pain, diarrhea, nausea and vomiting.  Genitourinary: Negative for dysuria and hematuria.  Neurological: Negative for dizziness, sensory change and focal weakness.  All other systems reviewed and are negative.   Nutrition: clear liquid Tolerating Diet: yes Tolerating PT: Ambulatory  DRUG ALLERGIES:   Allergies  Allergen Reactions  . Lisinopril Swelling  . Other Itching    Nitroglycerin Patch.    VITALS:  Blood pressure 111/63, pulse 64, temperature 98.3 F (36.8 C), temperature source Oral, resp. rate 16, height 6\' 1"  (1.854 m), weight 79.4 kg, SpO2 98 %.  PHYSICAL EXAMINATION:   Physical Exam  GENERAL:  78 y.o.-year-old patient lying in bed in no acute distress.  EYES: Pupils equal, round, reactive to light and accommodation. No scleral icterus. Extraocular muscles intact.  HEENT: Head atraumatic, normocephalic. Oropharynx and nasopharynx clear.  NECK:  Supple, no jugular venous distention. No thyroid enlargement, no tenderness.  LUNGS: Normal breath sounds bilaterally, no wheezing, rales, rhonchi. No use of accessory muscles of respiration.  CARDIOVASCULAR: S1, S2 normal. No murmurs, rubs, or gallops.  ABDOMEN: Soft, nontender, nondistended. Bowel sounds present. No  organomegaly or mass.  EXTREMITIES: No cyanosis, clubbing or edema b/l.    NEUROLOGIC: Cranial nerves II through XII are intact. No focal Motor or sensory deficits b/l.   PSYCHIATRIC: The patient is alert and oriented x 3.  SKIN: No obvious rash, lesion, or ulcer.    LABORATORY PANEL:   CBC Recent Labs  Lab 04/19/19 0538 04/19/19 1321  WBC 4.9  --   HGB 7.1* 8.0*  HCT 22.5*  --   PLT 128*  --    ------------------------------------------------------------------------------------------------------------------  Chemistries  Recent Labs  Lab 04/16/19 1616  04/19/19 0538  NA 140   < > 142  K 4.2   < > 3.5  CL 105   < > 112*  CO2 22   < > 25  GLUCOSE 123*   < > 108*  BUN 49*   < > 23  CREATININE 0.99   < > 0.88  CALCIUM 8.9   < > 7.8*  AST 17  --   --   ALT 12  --   --   ALKPHOS 63  --   --   BILITOT 0.6  --   --    < > = values in this interval not displayed.   ------------------------------------------------------------------------------------------------------------------  Cardiac Enzymes Recent Labs  Lab 04/16/19 1616  TROPONINI <0.03   ------------------------------------------------------------------------------------------------------------------  RADIOLOGY:  Ct Head Wo Contrast  Result Date: 04/17/2019 CLINICAL DATA:  Altered mental status EXAM: CT HEAD WITHOUT CONTRAST TECHNIQUE: Contiguous axial images were obtained from the base of the skull through the vertex without intravenous contrast. COMPARISON:  04/21/2018 FINDINGS: Brain: There is no mass, hemorrhage or extra-axial collection. The appearance of  the white matter is normal for the patient's age. There is mild generalized volume loss. Vascular: No abnormal hyperdensity of the major intracranial arteries or dural venous sinuses. No intracranial atherosclerosis. Skull: The visualized skull base, calvarium and extracranial soft tissues are normal. Sinuses/Orbits: No fluid levels or advanced mucosal  thickening of the visualized paranasal sinuses. No mastoid or middle ear effusion. The orbits are normal. IMPRESSION: No acute intracranial abnormality. Electronically Signed   By: Ulyses Jarred M.D.   On: 04/17/2019 22:45     ASSESSMENT AND PLAN:   78 year old male with past medical history of COPD, lung cancer, coronary artery disease, alcohol abuse who presents to the hospital due to hematemesis.  1.  GI bleed-patient presented to the hospital due to acute hematemesis. -Hemoglobin improved posttransfusion currently stable at 8.0 this afternoon.  No further hematemesis overnight.  Continue Protonix drip.  Hold Plavix.  Plan for endoscopy next week. - GI is following - will advance diet for now as discussed with GI.   2.  Acute blood loss anemia-secondary to the GI bleed. -Patient presented to the hospital hemoglobin down to 6.4 from baseline around 12. - Patient has been transfused 2 units of packed red blood cells.  Hemoglobin improved posttransfusion.  Currently stable 8.1. - follow serial Hg.   3.  Altered mental status/encephalopathy-likely metabolic encephalopathy secondary to polypharmacy. - CT head was negative for acute pathology, ammonia level was normal.  Urine drug screen was also negative. - Mental status much improved and back to baseline.   4.  Chronic metastatic renal cell carcinoma- patient follows with Dr. Rogue Bussing. -Continue outpatient follow-up.  5.  Diabetes type 2 without complication- cont. sliding scale insulin.  6. Hypothyroidism - cont. Synthroid.   7. Hyperlipidemia - cont. Atorvastatin.    All the records are reviewed and case discussed with Care Management/Social Worker. Management plans discussed with the patient, family and they are in agreement.  CODE STATUS: Full code  DVT Prophylaxis: Ted's & SCD's.   TOTAL TIME TAKING CARE OF THIS PATIENT: 30 minutes.   POSSIBLE D/C IN 2-3 DAYS, DEPENDING ON CLINICAL CONDITION.   Henreitta Leber M.D on  04/19/2019 at 1:36 PM  Between 7am to 6pm - Pager - [phone]  After 6pm go to [email or web address] - Proofreader  [facility]  Office  [phone]  CC: Primary care physician; Dion Body, MD

## 2019-04-20 DIAGNOSIS — K922 Gastrointestinal hemorrhage, unspecified: Secondary | ICD-10-CM

## 2019-04-20 LAB — BASIC METABOLIC PANEL
Anion gap: 5 (ref 5–15)
BUN: 14 mg/dL (ref 8–23)
CO2: 25 mmol/L (ref 22–32)
Calcium: 7.7 mg/dL — ABNORMAL LOW (ref 8.9–10.3)
Chloride: 106 mmol/L (ref 98–111)
Creatinine, Ser: 1.02 mg/dL (ref 0.61–1.24)
GFR calc Af Amer: >60 mL/min (ref >60)
GFR calc non Af Amer: >60 mL/min (ref >60)
Glucose, Bld: 129 mg/dL — ABNORMAL HIGH (ref 70–99)
Potassium: 3.6 mmol/L (ref 3.5–5.1)
Sodium: 136 mmol/L (ref 135–145)

## 2019-04-20 LAB — FERRITIN: Ferritin: 41 ng/mL (ref 24–336)

## 2019-04-20 LAB — GLUCOSE, CAPILLARY
Glucose-Capillary: 118 mg/dL — ABNORMAL HIGH (ref 70–99)
Glucose-Capillary: 120 mg/dL — ABNORMAL HIGH (ref 70–99)
Glucose-Capillary: 107 mg/dL — ABNORMAL HIGH (ref 70–99)
Glucose-Capillary: 122 mg/dL — ABNORMAL HIGH (ref 70–99)

## 2019-04-20 LAB — CBC
HCT: 23.2 % — ABNORMAL LOW (ref 39.0–52.0)
Hemoglobin: 7.5 g/dL — ABNORMAL LOW (ref 13.0–17.0)
MCH: 30.1 pg (ref 26.0–34.0)
MCHC: 32.3 g/dL (ref 30.0–36.0)
MCV: 93.2 fL (ref 80.0–100.0)
Platelets: 125 10*3/uL — ABNORMAL LOW (ref 150–400)
RBC: 2.49 MIL/uL — ABNORMAL LOW (ref 4.22–5.81)
RDW: 15.9 % — ABNORMAL HIGH (ref 11.5–15.5)
WBC: 5.4 10*3/uL (ref 4.0–10.5)
nRBC: 0 % (ref 0.0–0.2)

## 2019-04-20 LAB — IRON AND TIBC
Iron: 22 ug/dL — ABNORMAL LOW (ref 45–182)
Saturation Ratios: 7 % — ABNORMAL LOW (ref 17.9–39.5)
TIBC: 314 ug/dL (ref 250–450)
UIBC: 292 ug/dL

## 2019-04-20 LAB — FOLATE: Folate: 12.8 ng/mL (ref >5.9)

## 2019-04-20 LAB — VITAMIN B12: Vitamin B-12: 345 pg/mL (ref 180–914)

## 2019-04-20 MED ORDER — MIRTAZAPINE 15 MG PO TABS: 15.0000 mg | ORAL_TABLET | Freq: Every day | ORAL | Status: DC

## 2019-04-20 MED ORDER — DULOXETINE HCL 30 MG PO CPEP
60.0000 mg | ORAL_CAPSULE | Freq: Every day | ORAL | Status: DC
  Administered 2019-04-20 – 2019-04-21 (×2): 60 mg via ORAL
  Filled 2019-04-20 (×2): qty 2

## 2019-04-20 MED ORDER — SODIUM CHLORIDE 0.9 % IV SOLN
300.0000 mg | Freq: Once | INTRAVENOUS | Status: AC
  Administered 2019-04-21: 300 mg via INTRAVENOUS
  Filled 2019-04-20: qty 15

## 2019-04-20 MED ORDER — MIRTAZAPINE 15 MG PO TABS
15.0000 mg | ORAL_TABLET | Freq: Once | ORAL | Status: AC
  Administered 2019-04-20: 21:00:00 15 mg via ORAL
  Filled 2019-04-20: qty 1

## 2019-04-20 MED ORDER — PANTOPRAZOLE SODIUM 40 MG PO TBEC
40.0000 mg | DELAYED_RELEASE_TABLET | Freq: Two times a day (BID) | ORAL | Status: DC
  Administered 2019-04-20 – 2019-04-21 (×2): 40 mg via ORAL
  Filled 2019-04-20 (×2): qty 1

## 2019-04-20 MED ORDER — SODIUM CHLORIDE 0.9 % IV SOLN
510.0000 mg | Freq: Once | INTRAVENOUS | Status: AC
  Administered 2019-04-20: 19:00:00 510 mg via INTRAVENOUS
  Filled 2019-04-20: qty 17

## 2019-04-20 NOTE — Progress Notes (Signed)
[facility] [place] NAME: Glen Davis    MR#:  [id number]  DATE OF BIRTH:  1941/07/17  SUBJECTIVE:   Patient is a little confused today but no other acute events overnight.  Hemoglobin is stable.  Plan for endoscopy tomorrow.  REVIEW OF SYSTEMS:    Review of Systems  Constitutional: Negative for chills and fever.  HENT: Negative for congestion and tinnitus.   Eyes: Negative for blurred vision and double vision.  Respiratory: Negative for cough, shortness of breath and wheezing.   Cardiovascular: Negative for chest pain, orthopnea and PND.  Gastrointestinal: Negative for abdominal pain, diarrhea, nausea and vomiting.  Genitourinary: Negative for dysuria and hematuria.  Neurological: Negative for dizziness, sensory change and focal weakness.  All other systems reviewed and are negative.   Nutrition: clear liquid Tolerating Diet: yes Tolerating PT: Ambulatory  DRUG ALLERGIES:   Allergies  Allergen Reactions  . Lisinopril Swelling  . Other Itching    Nitroglycerin Patch.    VITALS:  Blood pressure (!) 144/78, pulse 74, temperature 98.2 F (36.8 C), resp. rate 19, height 6\' 1"  (1.854 m), weight 79.4 kg, SpO2 99 %.  PHYSICAL EXAMINATION:   Physical Exam  GENERAL:  78 y.o.-year-old patient lying in bed in no acute distress.  EYES: Pupils equal, round, reactive to light and accommodation. No scleral icterus. Extraocular muscles intact.  HEENT: Head atraumatic, normocephalic. Oropharynx and nasopharynx clear.  NECK:  Supple, no jugular venous distention. No thyroid enlargement, no tenderness.  LUNGS: Normal breath sounds bilaterally, no wheezing, rales, rhonchi. No use of accessory muscles of respiration.  CARDIOVASCULAR: S1, S2 normal. No murmurs, rubs, or gallops.  ABDOMEN: Soft, nontender, nondistended. Bowel sounds present. No organomegaly or mass.  EXTREMITIES: No cyanosis, clubbing or edema b/l.    NEUROLOGIC: Cranial  nerves II through XII are intact. No focal Motor or sensory deficits b/l.   PSYCHIATRIC: The patient is alert and oriented x 2.  SKIN: No obvious rash, lesion, or ulcer.    LABORATORY PANEL:   CBC Recent Labs  Lab 04/20/19 0345  WBC 5.4  HGB 7.5*  HCT 23.2*  PLT 125*   ------------------------------------------------------------------------------------------------------------------  Chemistries  Recent Labs  Lab 04/16/19 1616  04/20/19 0345  NA 140   < > 136  K 4.2   < > 3.6  CL 105   < > 106  CO2 22   < > 25  GLUCOSE 123*   < > 129*  BUN 49*   < > 14  CREATININE 0.99   < > 1.02  CALCIUM 8.9   < > 7.7*  AST 17  --   --   ALT 12  --   --   ALKPHOS 63  --   --   BILITOT 0.6  --   --    < > = values in this interval not displayed.   ------------------------------------------------------------------------------------------------------------------  Cardiac Enzymes Recent Labs  Lab 04/16/19 1616  TROPONINI <0.03   ------------------------------------------------------------------------------------------------------------------  RADIOLOGY:  No results found.   ASSESSMENT AND PLAN:   78 year old male with past medical history of COPD, lung cancer, coronary artery disease, alcohol abuse who presents to the hospital due to hematemesis.  1.  GI bleed-patient presented to the hospital due to acute hematemesis. -Hemoglobin improved posttransfusion currently stable at 7.5 this afternoon.   Continue IV Protonix.  Patient has no acute hematemesis overnight.  Discussed with gastroenterology and plan for endoscopy tomorrow.  Plavix has  been on hold for 5 days.  2.  Acute blood loss anemia-secondary to the GI bleed. -Patient presented to the hospital hemoglobin down to 6.4 from baseline around 12. - Patient has been transfused 2 units of packed red blood cells.  Hemoglobin improved posttransfusion.  Currently stable 7.5 and will transfuse if < 7.   - follow serial Hg.    3.  Altered mental status/encephalopathy-likely metabolic encephalopathy secondary to polypharmacy. - CT head was negative for acute pathology, ammonia level was normal.  Urine drug screen was also negative. - Mental status continues to wax and wean and will cont. To monitor.   4.  Chronic metastatic renal cell carcinoma- patient follows with Dr. Rogue Bussing. -Continue outpatient follow-up.  5.  Diabetes type 2 without complication- cont. sliding scale insulin.  6. Hypothyroidism - cont. Synthroid.   7. Hyperlipidemia - cont. Atorvastatin.    All the records are reviewed and case discussed with Care Management/Social Worker. Management plans discussed with the patient, family and they are in agreement.  CODE STATUS: Full code  DVT Prophylaxis: Ted's & SCD's.   TOTAL TIME TAKING CARE OF THIS PATIENT: 30 minutes.   POSSIBLE D/C IN 1-2 DAYS, DEPENDING ON CLINICAL CONDITION.   Henreitta Leber M.D on 04/20/2019 at 2:17 PM  Between 7am to 6pm - Pager - [phone]  After 6pm go to [email or web address] - Proofreader  [facility]  Office  [phone]  CC: Primary care physician; Dion Body, MD

## 2019-04-20 NOTE — Progress Notes (Signed)
Glen Darby, MD [address]  [place]  [place], [place] [address]  Main: [phone]  Fax: [phone] Pager: [phone]   Subjective: No acute events overnight, he denies hematemesis, nausea, vomiting, abdominal pain.  He has been tolerating p.o. well   Objective: Vital signs in last 24 hours: Vitals:   04/19/19 1352 04/19/19 1940 04/20/19 0436 04/20/19 1611  BP: 106/62 (!) 141/68 (!) 144/78 121/66  Pulse: 80 78 74 78  Resp: '20 18 19 18  ' Temp: (!) 97.5 F (36.4 C) 98.3 F (36.8 C) 98.2 F (36.8 C) 98.3 F (36.8 C)  TempSrc: Oral Oral    SpO2: 94% 100% 99% 100%  Weight:      Height:       Weight change:   Intake/Output Summary (Last 24 hours) at 04/20/2019 1737 Last data filed at 04/20/2019 1300 Gross per 24 hour  Intake 240 ml  Output 475 ml  Net -235 ml     Exam: Heart:: Regular rate and rhythm or S1S2 present Lungs: normal and clear to auscultation Abdomen: soft, nontender, normal bowel sounds   Lab Results: CBC Latest Ref Rng & Units 04/20/2019 04/19/2019 04/19/2019  WBC 4.0 - 10.5 K/uL 5.4 - 4.9  Hemoglobin 13.0 - 17.0 g/dL 7.5(L) 8.0(L) 7.1(L)  Hematocrit 39.0 - 52.0 % 23.2(L) - 22.5(L)  Platelets 150 - 400 K/uL 125(L) - 128(L)   BMP Latest Ref Rng & Units 04/20/2019 04/19/2019 04/18/2019  Glucose 70 - 99 mg/dL 129(H) 108(H) 120(H)  BUN 8 - 23 mg/dL 14 23 44(H)  Creatinine 0.61 - 1.24 mg/dL 1.02 0.88 0.98  BUN/Creat Ratio 10 - 24 - - -  Sodium 135 - 145 mmol/L 136 142 142  Potassium 3.5 - 5.1 mmol/L 3.6 3.5 3.7  Chloride 98 - 111 mmol/L 106 112(H) 112(H)  CO2 22 - 32 mmol/L '25 25 25  ' Calcium 8.9 - 10.3 mg/dL 7.7(L) 7.8(L) 8.2(L)   Hepatic Function Latest Ref Rng & Units 04/16/2019 04/08/2019 03/16/2019  Total Protein 6.5 - 8.1 g/dL 6.7 7.0 6.9  Albumin 3.5 - 5.0 g/dL 3.9 4.1 3.9  AST 15 - 41 U/L '17 16 16  ' ALT 0 - 44 U/L '12 10 13  ' Alk Phosphatase 38 - 126 U/L 63 77 104  Total Bilirubin 0.3 - 1.2 mg/dL 0.6 0.5 0.5  Bilirubin, Direct 0.1  - 0.5 mg/dL - - -    Micro Results: Recent Results (from the past 240 hour(s))  SARS Coronavirus 2 (CEPHEID - Performed in [facility] hospital lab), Hosp Order     Status: None   Collection Time: 04/16/19  4:17 PM  Result Value Ref Range Status   SARS Coronavirus 2 NEGATIVE NEGATIVE Final    Comment: (NOTE) If result is NEGATIVE SARS-CoV-2 target nucleic acids are NOT DETECTED. The SARS-CoV-2 RNA is generally detectable in upper and lower  respiratory specimens during the acute phase of infection. The lowest  concentration of SARS-CoV-2 viral copies this assay can detect is 250  copies / mL. A negative result does not preclude SARS-CoV-2 infection  and should not be used as the sole basis for treatment or other  patient management decisions.  A negative result may occur with  improper specimen collection / handling, submission of specimen other  than nasopharyngeal swab, presence of viral mutation(s) within the  areas targeted by this assay, and inadequate number of viral copies  (<250 copies / mL). A negative result must be combined with clinical  observations, patient history,  and epidemiological information. If result is POSITIVE SARS-CoV-2 target nucleic acids are DETECTED. The SARS-CoV-2 RNA is generally detectable in upper and lower  respiratory specimens dur ing the acute phase of infection.  Positive  results are indicative of active infection with SARS-CoV-2.  Clinical  correlation with patient history and other diagnostic information is  necessary to determine patient infection status.  Positive results do  not rule out bacterial infection or co-infection with other viruses. If result is PRESUMPTIVE POSTIVE SARS-CoV-2 nucleic acids MAY BE PRESENT.   A presumptive positive result was obtained on the submitted specimen  and confirmed on repeat testing.  While 2019 novel coronavirus  (SARS-CoV-2) nucleic acids may be present in the submitted sample  additional  confirmatory testing may be necessary for epidemiological  and / or clinical management purposes  to differentiate between  SARS-CoV-2 and other Sarbecovirus currently known to infect humans.  If clinically indicated additional testing with an alternate test  methodology [phone]) is advised. The SARS-CoV-2 RNA is generally  detectable in upper and lower respiratory sp ecimens during the acute  phase of infection. The expected result is Negative. Fact Sheet for Patients:  StrictlyIdeas.no Fact Sheet for Healthcare Providers: BankingDealers.co.za This test is not yet approved or cleared by the [place] FDA and has been authorized for detection and/or diagnosis of SARS-CoV-2 by FDA under an Emergency Use Authorization (EUA).  This EUA will remain in effect (meaning this test can be used) for the duration of the COVID-19 declaration under Section 564(b)(1) of the Act, 21 U.S.C. section 360bbb-3(b)(1), unless the authorization is terminated or revoked sooner. Performed at [facility], [address] [place], [place] [address]   MRSA PCR Screening     Status: None   Collection Time: 04/16/19  9:39 PM  Result Value Ref Range Status   MRSA by PCR NEGATIVE NEGATIVE Final    Comment:        The GeneXpert MRSA Assay (FDA approved for NASAL specimens only), is one component of a comprehensive MRSA colonization surveillance program. It is not intended to diagnose MRSA infection nor to guide or monitor treatment for MRSA infections. Performed at [facility], [address] [place], [place] [address]    Studies/Results: No results found. Medications:  I have reviewed the patient's current medications. Prior to Admission:  Medications Prior to Admission  Medication Sig Dispense Refill Last Dose  . aspirin EC 81 MG tablet Take 81 mg by mouth daily.   04/16/2019 at Unknown time  . atorvastatin (LIPITOR) 40 MG  tablet Take 1 tablet (40 mg total) by mouth daily. 30 tablet 3 04/15/2019 at Unknown time  . busPIRone (BUSPAR) 5 MG tablet Take 5 mg by mouth 2 (two) times daily.   04/16/2019 at Unknown time  . cholecalciferol (VITAMIN D) 400 units TABS tablet Take 400 Units by mouth daily.   04/16/2019 at Unknown time  . clopidogrel (PLAVIX) 75 MG tablet Take 75 mg by mouth daily.   04/16/2019 at Unknown time  . DULoxetine (CYMBALTA) 30 MG capsule Take 2 capsules by mouth daily.   04/16/2019 at Unknown time  . fentaNYL (DURAGESIC) 75 MCG/HR Place 1 patch onto the skin every 3 (three) days. 10 patch 0 Past Week at Unknown time  . levothyroxine (SYNTHROID) 75 MCG tablet Take 1 tablet (75 mcg total) by mouth daily before breakfast. 30 tablet 3 04/16/2019 at Unknown time  . metFORMIN (GLUCOPHAGE) 500 MG tablet TAKE 1 TABLET (500 MG TOTAL) BY MOUTH  2 (TWO) TIMES DAILY WITH A MEAL. 60 tablet 3 04/16/2019 at Unknown time  . Misc Natural Products (GLUCOSAMINE CHOND COMPLEX/MSM) TABS Take 2 tablets by mouth daily.   04/16/2019 at Unknown time  . montelukast (SINGULAIR) 10 MG tablet TAKE 1 TABLET BY MOUTH EVERYDAY AT BEDTIME 90 tablet 1 04/15/2019 at Unknown time  . Omega-3 Fatty Acids (FISH OIL) 1000 MG CPDR Take 1 capsule by mouth daily.   04/16/2019 at Unknown time  . oxyCODONE 10 MG TABS Take 1-2 tablets (10-20 mg total) by mouth every 4 (four) hours as needed for breakthrough pain. 90 tablet 0 04/16/2019 at Unknown time  . tamsulosin (FLOMAX) 0.4 MG CAPS capsule TAKE 1 CAPSULE (0.4 MG TOTAL) BY MOUTH EVERY MORNING. 30 capsule 3 04/16/2019 at Unknown time  . temazepam (RESTORIL) 7.5 MG capsule Take 1 capsule (7.5 mg total) by mouth at bedtime as needed for sleep. 30 capsule 3 04/15/2019 at Unknown time  . Tiotropium Bromide Monohydrate (SPIRIVA RESPIMAT) 1.25 MCG/ACT AERS Inhale 1.25 Act into the lungs 2 (two) times daily. 1 Inhaler 11 04/16/2019 at Unknown time  . tiZANidine (ZANAFLEX) 4 MG tablet Take 4 mg by mouth daily.     04/16/2019 at Unknown time  . Turmeric 500 MG TABS Take 1 tablet by mouth daily.   04/16/2019 at Unknown time  . albuterol (PROVENTIL HFA;VENTOLIN HFA) 108 (90 Base) MCG/ACT inhaler Inhale 2 puffs into the lungs every 6 (six) hours as needed for wheezing or shortness of breath. 1 Inhaler 2 prn at prn  . Aspirin-Salicylamide-Caffeine (BC HEADACHE POWDER PO) Take 1 packet by mouth daily.   prn at prn  . CVS CITRATE OF MAGNESIA SOLN Take 15 mLs by mouth as needed for constipation.   prn at prn  . fluticasone (FLONASE) 50 MCG/ACT nasal spray Place 2 sprays into both nostrils daily. 16 g 2 prn at prn  . ipratropium-albuterol (DUONEB) 0.5-2.5 (3) MG/3ML SOLN Take 3 mLs by nebulization every 4 (four) hours as needed. 360 mL 3 prn at prn  . loperamide (IMODIUM) 1 MG/5ML solution Take 4 mg by mouth as needed for diarrhea or loose stools.   prn at prn  . loratadine (CLARITIN) 10 MG tablet Take 10 mg by mouth daily.   prn at prn  . MELATONIN PO Take 10 mg by mouth at bedtime.    prn at prn  . nitroGLYCERIN (NITROSTAT) 0.4 MG SL tablet Place 1 tablet (0.4 mg total) under the tongue every 5 (five) minutes as needed. 30 tablet 0 prn at prn  . OXYGEN Inhale 2 L into the lungs at bedtime.   Taking   Scheduled: . atorvastatin  40 mg Oral Daily  . chlorhexidine  15 mL Mouth Rinse BID  . cholecalciferol  400 Units Oral Daily  . DULoxetine  60 mg Oral Daily  . fentaNYL  1 patch Transdermal Q72H  . insulin aspart  0-5 Units Subcutaneous QHS  . insulin aspart  0-9 Units Subcutaneous TID WC  . levothyroxine  75 mcg Oral QAC breakfast  . mouth rinse  15 mL Mouth Rinse q12n4p  . montelukast  10 mg Oral QHS  . pantoprazole  40 mg Oral BID AC  . tamsulosin  0.4 mg Oral BH-q7a  . umeclidinium bromide  1 puff Inhalation Daily   Continuous: . ferumoxytol    . [START ON 04/21/2019] iron sucrose     MKL:[id number] **OR** acetaminophen, albuterol, fluticasone, ipratropium-albuterol, loratadine, ondansetron **OR**  ondansetron (ZOFRAN) IV, oxyCODONE, polyethylene glycol Anti-infectives (  From admission, onward)   None     Scheduled Meds: . atorvastatin  40 mg Oral Daily  . chlorhexidine  15 mL Mouth Rinse BID  . cholecalciferol  400 Units Oral Daily  . DULoxetine  60 mg Oral Daily  . fentaNYL  1 patch Transdermal Q72H  . insulin aspart  0-5 Units Subcutaneous QHS  . insulin aspart  0-9 Units Subcutaneous TID WC  . levothyroxine  75 mcg Oral QAC breakfast  . mouth rinse  15 mL Mouth Rinse q12n4p  . montelukast  10 mg Oral QHS  . pantoprazole  40 mg Oral BID AC  . tamsulosin  0.4 mg Oral BH-q7a  . umeclidinium bromide  1 puff Inhalation Daily   Continuous Infusions: . ferumoxytol    . [START ON 04/21/2019] iron sucrose     PRN Meds:.acetaminophen **OR** acetaminophen, albuterol, fluticasone, ipratropium-albuterol, loratadine, ondansetron **OR** ondansetron (ZOFRAN) IV, oxyCODONE, polyethylene glycol   Assessment: Active Problems:   Hematemesis  Hemoglobin slightly dropped today but no evidence of active GI bleed He does have severe iron deficiency based on iron studies from 2018  Plan: Plan for EGD tomorrow Continue Protonix 40 mg twice daily Recheck iron studies, B12 and folate panel Parenteral iron ordered N.p.o. past midnight   LOS: 4 days   Sandeep Delagarza 04/20/2019, 5:37 PM

## 2019-04-20 NOTE — Care Management Important Message (Signed)
Important Message  Patient Details  Name: Glen Davis MRN: [id number] Date of Birth: July 15, 1941   Medicare Important Message Given:  Yes    Juliann Pulse A Choua Chalker 04/20/2019, 11:38 AM

## 2019-04-21 ENCOUNTER — Encounter
Admission: EM | Disposition: A | Discharge status: Home / Self Care | Payer: Self-pay | Source: Home / Self Care | Attending: Specialist

## 2019-04-21 ENCOUNTER — Encounter: Payer: Self-pay | Admitting: *Deleted

## 2019-04-21 ENCOUNTER — Ambulatory Visit: Payer: PPO | Admitting: Radiation Oncology

## 2019-04-21 ENCOUNTER — Inpatient Hospital Stay: Payer: PPO | Admitting: Registered Nurse

## 2019-04-21 DIAGNOSIS — K317 Polyp of stomach and duodenum: Secondary | ICD-10-CM

## 2019-04-21 HISTORY — PX: ESOPHAGOGASTRODUODENOSCOPY: SHX5428

## 2019-04-21 LAB — GLUCOSE, CAPILLARY
Glucose-Capillary: 92 mg/dL (ref 70–99)
Glucose-Capillary: 95 mg/dL (ref 70–99)

## 2019-04-21 LAB — CBC
HCT: 24.3 % — ABNORMAL LOW (ref 39.0–52.0)
Hemoglobin: 7.9 g/dL — ABNORMAL LOW (ref 13.0–17.0)
MCH: 30.2 pg (ref 26.0–34.0)
MCHC: 32.5 g/dL (ref 30.0–36.0)
MCV: 92.7 fL (ref 80.0–100.0)
Platelets: 140 10*3/uL — ABNORMAL LOW (ref 150–400)
RBC: 2.62 MIL/uL — ABNORMAL LOW (ref 4.22–5.81)
RDW: 16 % — ABNORMAL HIGH (ref 11.5–15.5)
WBC: 5.3 10*3/uL (ref 4.0–10.5)
nRBC: 0 % (ref 0.0–0.2)

## 2019-04-21 SURGERY — EGD (ESOPHAGOGASTRODUODENOSCOPY)
Anesthesia: General

## 2019-04-21 MED ORDER — GLYCOPYRROLATE 0.2 MG/ML IJ SOLN
INTRAMUSCULAR | Status: AC
  Filled 2019-04-21: qty 1

## 2019-04-21 MED ORDER — PROPOFOL 10 MG/ML IV BOLUS
INTRAVENOUS | Status: DC | PRN
  Administered 2019-04-21: 90 mg via INTRAVENOUS

## 2019-04-21 MED ORDER — PROPOFOL 500 MG/50ML IV EMUL
INTRAVENOUS | Status: DC | PRN
  Administered 2019-04-21: 150 ug/kg/min via INTRAVENOUS

## 2019-04-21 MED ORDER — SODIUM CHLORIDE 0.9 % IV SOLN
INTRAVENOUS | Status: DC
  Administered 2019-04-21: 09:00:00 via INTRAVENOUS

## 2019-04-21 MED ORDER — EPHEDRINE SULFATE 50 MG/ML IJ SOLN
INTRAMUSCULAR | Status: DC | PRN
  Administered 2019-04-21 (×2): 15 mg via INTRAVENOUS
  Administered 2019-04-21: 20 mg via INTRAVENOUS

## 2019-04-21 MED ORDER — PHENYLEPHRINE HCL (PRESSORS) 10 MG/ML IV SOLN
INTRAVENOUS | Status: DC | PRN
  Administered 2019-04-21: 100 ug via INTRAVENOUS
  Administered 2019-04-21: 200 ug via INTRAVENOUS

## 2019-04-21 MED ORDER — SODIUM CHLORIDE (PF) 0.9 % IJ SOLN
PREFILLED_SYRINGE | INTRAMUSCULAR | Status: DC | PRN
  Administered 2019-04-21: 4 mL

## 2019-04-21 MED ORDER — EPHEDRINE SULFATE 50 MG/ML IJ SOLN
INTRAMUSCULAR | Status: AC
  Filled 2019-04-21: qty 1

## 2019-04-21 MED ORDER — PANTOPRAZOLE SODIUM 40 MG PO TBEC: 40.0000 mg | DELAYED_RELEASE_TABLET | Freq: Two times a day (BID) | ORAL | 1 refills | Status: AC

## 2019-04-21 MED ORDER — PROPOFOL 10 MG/ML IV BOLUS
INTRAVENOUS | Status: AC
  Filled 2019-04-21: qty 40

## 2019-04-21 MED ORDER — HALOPERIDOL LACTATE 5 MG/ML IJ SOLN: 2.0000 mg | Freq: Four times a day (QID) | INTRAMUSCULAR | Status: DC | PRN

## 2019-04-21 MED ORDER — SUCCINYLCHOLINE CHLORIDE 20 MG/ML IJ SOLN
INTRAMUSCULAR | Status: AC
  Filled 2019-04-21: qty 1

## 2019-04-21 MED ORDER — LIDOCAINE HCL (PF) 2 % IJ SOLN
INTRAMUSCULAR | Status: AC
  Filled 2019-04-21: qty 10

## 2019-04-21 MED ORDER — LIDOCAINE HCL (CARDIAC) PF 100 MG/5ML IV SOSY
PREFILLED_SYRINGE | INTRAVENOUS | Status: DC | PRN
  Administered 2019-04-21: 40 mg via INTRAVENOUS

## 2019-04-21 NOTE — Anesthesia Post-op Follow-up Note (Signed)
Anesthesia QCDR form completed.        

## 2019-04-21 NOTE — Progress Notes (Signed)
Pt growing in agitation refusing to staying bed. Does not believe he is in hospital. Called pt wife. Pt remains belligerent. Security called per pt request. Notified MD. MD ordered modified NPO to allow for sips with meds and ordered Haldol PRN. Provided morning medications as to demonstrate nursing care. . Pt is currently in bed. Continues to exhibit signs of agitation to include/ not limited to  tapping of TV remote and feet, grinding teeth, narrowing eyes and looking opposite direction when being spoken to.

## 2019-04-21 NOTE — Anesthesia Postprocedure Evaluation (Signed)
Anesthesia Post Note  Patient: Glen Davis  Procedure(s) Performed: ESOPHAGOGASTRODUODENOSCOPY (EGD) (N/A )  Patient location during evaluation: Endoscopy Anesthesia Type: General Level of consciousness: awake and alert Pain management: pain level controlled Vital Signs Assessment: post-procedure vital signs reviewed and stable Respiratory status: spontaneous breathing, nonlabored ventilation, respiratory function stable and patient connected to nasal cannula oxygen Cardiovascular status: blood pressure returned to baseline and stable Postop Assessment: no apparent nausea or vomiting Anesthetic complications: no     Last Vitals:  Vitals:   04/21/19 0922 04/21/19 1203  BP: (!) 105/57 111/63  Pulse: 70 71  Resp: 16 18  Temp: (!) 36.3 C 37.1 C  SpO2: 96% 97%    Last Pain:  Vitals:   04/21/19 1206  TempSrc:   PainSc: 0-No pain                 Martha Clan

## 2019-04-21 NOTE — Anesthesia Procedure Notes (Signed)
Date/Time: 04/21/2019 8:47 AM Performed by: Doreen Salvage, CRNA Pre-anesthesia Checklist: Patient identified, Emergency Drugs available, Suction available and Patient being monitored Patient Re-evaluated:Patient Re-evaluated prior to induction Oxygen Delivery Method: Nasal cannula Induction Type: IV induction Dental Injury: Teeth and Oropharynx as per pre-operative assessment  Comments: Nasal cannula with etCO2 monitoring

## 2019-04-21 NOTE — Op Note (Signed)
[facility] Gastroenterology Patient Name: Glen Davis Procedure Date: 04/21/2019 8:44 AM MRN: [id number] Account #: [id number] Date of Birth: 1941-03-05 Admit Type: Inpatient Age: 78 Room: [facility] ENDO ROOM 3 Gender: Male Note Status: Finalized Procedure:            Upper GI endoscopy Indications:          Hematemesis Providers:            Lin Landsman MD, MD Referring MD:         Dion Body (Referring MD) Medicines:            Monitored Anesthesia Care Complications:        No immediate complications. Estimated blood loss:                        Minimal. Procedure:            Pre-Anesthesia Assessment:                       - Prior to the procedure, a History and Physical was                        performed, and patient medications and allergies were                        reviewed. The patient is competent. The risks and                        benefits of the procedure and the sedation options and                        risks were discussed with the patient. All questions                        were answered and informed consent was obtained.                        Patient identification and proposed procedure were                        verified by the physician, the nurse, the                        anesthesiologist, the anesthetist and the technician in                        the pre-procedure area in the procedure room in the                        endoscopy suite. Mental Status Examination: alert and                        oriented. Airway Examination: normal oropharyngeal                        airway and neck mobility. Respiratory Examination:                        clear to auscultation. CV Examination: normal.  Prophylactic Antibiotics: The patient does not require                        prophylactic antibiotics. Prior Anticoagulants: The                        patient has taken Plavix (clopidogrel), last dose was 4                     days prior to procedure. ASA Grade Assessment: III - A                        patient with severe systemic disease. After reviewing                        the risks and benefits, the patient was deemed in                        satisfactory condition to undergo the procedure. The                        anesthesia plan was to use monitored anesthesia care                        (MAC). Immediately prior to administration of                        medications, the patient was re-assessed for adequacy                        to receive sedatives. The heart rate, respiratory rate,                        oxygen saturations, blood pressure, adequacy of                        pulmonary ventilation, and response to care were                        monitored throughout the procedure. The physical status                        of the patient was re-assessed after the procedure.                       After obtaining informed consent, the endoscope was                        passed under direct vision. Throughout the procedure,                        the patient's blood pressure, pulse, and oxygen                        saturations were monitored continuously. The Endoscope                        was introduced through the mouth, and advanced to the  second part of duodenum. The upper GI endoscopy was                        accomplished without difficulty. The patient tolerated                        the procedure well. Findings:      The duodenal bulb and second portion of the duodenum were normal.      Two 5 to 10 mm sessile/submucosal lesions, ulcerated on tip of polyps       with no bleeding and no stigmata of recent bleeding were found on the       greater curvature of the gastric body. The polyps were removed with a       hot snare. Resection and retrieval were complete. To prevent bleeding       after the polypectomy, two hemostatic clips, one on each  polypectomy       site were placed (MR conditional). There was oozing at the end of the       procedure. Area was successfully injected with 4 mL of a 1:10,000       solution of epinephrine for hemostasis of bleeding caused by the       procedure.      The gastroesophageal junction and examined esophagus were normal. Impression:           - Normal duodenal bulb and second portion of the                        duodenum.                       - Two gastric polyps/submucosal ulcerated lesions,                        source of hematemesis. Resected and retrieved. Clips                        (MR conditional) were placed. Injected.                       - Normal gastroesophageal junction and esophagus. Recommendation:       - Return patient to hospital ward for ongoing care.                       - Resume regular diet today.                       - No ibuprofen, naproxen, or other non-steroidal                        anti-inflammatory drugs after polyp removal.                       - Use a proton pump inhibitor PO BID indefinitely.                       - Await pathology results.                       - Resume Plavix (clopidogrel) at prior dose in 1 week.  Refer to primary physician for further adjustment of                        therapy. Procedure Code(s):    --- Professional ---                       [phone], Esophagogastroduodenoscopy, flexible, transoral;                        with removal of tumor(s), polyp(s), or other lesion(s)                        by snare technique Diagnosis Code(s):    --- Professional ---                       K31.7, Polyp of stomach and duodenum                       K92.0, Hematemesis CPT copyright 2019 American Medical Association. All rights reserved. The codes documented in this report are preliminary and upon coder review may  be revised to meet current compliance requirements. Dr. Ulyess Mort Lin Landsman MD, MD 04/21/2019  9:26:43 AM This report has been signed electronically. Number of Addenda: 0 Note Initiated On: 04/21/2019 8:44 AM      [facility]

## 2019-04-21 NOTE — TOC Transition Note (Signed)
Transition of Care [facility]) - CM/SW Discharge Note   Patient Details  Name: Glen Davis MRN: [id number] Date of Birth: Feb 02, 1941  [facility]) CM/SW Contact:  Marshell Garfinkel, RN Phone Number: 04/21/2019, 12:54 PM   Clinical Narrative:    Per MD patient does not currently need home health services. RNCM touched base with patient's wife [name] and she agrees that home health is not needed.    Final next level of care: Home/Self Care Barriers to Discharge: Continued Medical Work up   Patient Goals and CMS Choice        Discharge Placement                       Discharge Plan and Services   Discharge Planning Services: CM Consult                                 Social Determinants of Health (SDOH) Interventions     Readmission Risk Interventions Readmission Risk Prevention Plan 04/18/2019  [place] or Home Care Consult Complete  Medication Review (RN Care Manager) Complete  Some recent data might be hidden

## 2019-04-21 NOTE — Anesthesia Preprocedure Evaluation (Signed)
Anesthesia Evaluation  Patient identified by MRN, date of birth, ID band Patient awake    Reviewed: Allergy & Precautions, NPO status , Patient's Chart, lab work & pertinent test results  History of Anesthesia Complications Negative for: history of anesthetic complications  Airway Mallampati: II       Dental  (+) Upper Dentures   Pulmonary neg shortness of breath, COPD,  COPD inhaler, neg recent URI, former smoker,           Cardiovascular hypertension, Pt. on medications (-) angina+ CAD and + Cardiac Stents  (-) Past MI (-) dysrhythmias (-) Valvular Problems/Murmurs     Neuro/Psych neg Seizures Anxiety    GI/Hepatic Neg liver ROS, neg GERD  ,  Endo/Other  neg diabetes  Renal/GU Renal disease (Renal CA, sp nephrectomy)     Musculoskeletal   Abdominal   Peds  Hematology   Anesthesia Other Findings Past Medical History: No date: CAD (coronary artery disease) No date: Cancer ([place])     Comment:  left arm No date: COPD (chronic obstructive pulmonary disease) ([place]) No date: Hypertension No date: Kidney stone No date: Lung cancer ([place]) 01/24/2016: Melanoma ([place])     Comment:  right shoulder 02/02/2016: Thyroid nodule     Comment:  left BENIGN THYROID NODULE by FNA   Reproductive/Obstetrics                             Anesthesia Physical  Anesthesia Plan  ASA: III  Anesthesia Plan: General   Post-op Pain Management:    Induction: Intravenous  PONV Risk Score and Plan: 2 and Propofol infusion and TIVA  Airway Management Planned: Nasal Cannula and Natural Airway  Additional Equipment:   Intra-op Plan:   Post-operative Plan:   Informed Consent: I have reviewed the patients History and Physical, chart, labs and discussed the procedure including the risks, benefits and alternatives for the proposed anesthesia with the patient or authorized representative who has indicated his/her  understanding and acceptance.       Plan Discussed with:   Anesthesia Plan Comments:         Anesthesia Quick Evaluation

## 2019-04-21 NOTE — Transfer of Care (Signed)
Immediate Anesthesia Transfer of Care Note  Patient: Glen Davis  Procedure(s) Performed: Procedure(s): ESOPHAGOGASTRODUODENOSCOPY (EGD) (N/A)  Patient Location: PACU  Anesthesia Type:General  Level of Consciousness: sedated  Airway & Oxygen Therapy: Patient Spontanous Breathing and Patient connected to face mask oxygen  Post-op Assessment: Report given to RN and Post -op Vital signs reviewed and stable  Post vital signs: Reviewed and stable  Last Vitals:  Vitals:   04/21/19 0420 04/21/19 0922  BP: 110/75 (!) 105/57  Pulse: 79 70  Resp: 18 16  Temp: 37 C (!) 36.3 C  SpO2: 36% 64%    Complications: No apparent anesthesia complications

## 2019-04-21 NOTE — Discharge Summary (Signed)
[place] [facility] [place] NAME: Glen Davis    MR#:  [id number]  DATE OF BIRTH:  December 12, 1940  DATE OF ADMISSION:  04/16/2019 ADMITTING PHYSICIAN: Sela Hua, MD  DATE OF DISCHARGE: 04/21/2019   PRIMARY CARE PHYSICIAN: Dion Body, MD    ADMISSION DIAGNOSIS:  Acute upper GI bleed [K92.2]  DISCHARGE DIAGNOSIS:  Active Problems:   Hematemesis   SECONDARY DIAGNOSIS:   Past Medical History:  Diagnosis Date  . CAD (coronary artery disease)   . Cancer ([place])    left arm  . COPD (chronic obstructive pulmonary disease) ([place])   . Hypertension   . Kidney stone   . Lung cancer ([place])   . Melanoma ([place]) 01/24/2016   right shoulder  . Thyroid nodule 02/02/2016   left BENIGN THYROID NODULE by FNA    HOSPITAL COURSE:   78 year old male with past medical history of COPD, lung cancer, coronary artery disease, alcohol abuse who presents to the hospital due to hematemesis.  1.  GI bleed-patient presented to the hospital due to acute hematemesis. -Patient was admitted to the hospital and his Plavix was stopped.  Patient did received transfusions while in the hospital.  Plavix had to be held for 5 days prior to patient getting endoscopic evaluation. -Patient had endoscopy today which showed gastric polyps which were clipped and biopsied.  There was no evidence of acute bleeding.  Posttransfusion patient's hemoglobin has remained stable. -Patient is being discharged home as he is tolerating p.o. well and has had no further hematemesis. -He will be discharged on PPI twice daily with follow-up with gastroenterology in 2 weeks. -he was advised to hold his Plavix for additional 5 days.  2.  Acute blood loss anemia-secondary to the GI bleed combined with underlying iron deficiency anemia. -Patient's hemoglobin at baseline was around 11-12.  Hemoglobin trended down to as low as 6.4.  Patient required 2 to 3 units of packed red blood cells while in the  hospital.  Hemoglobin on the day of discharge is 7.9. -Patient also given IV iron infusion prior to discharge.  His Plavix is currently being held and needs to be held for additional 5 days.  Hemoglobin can be further followed as an outpatient.  3.  Altered mental status/encephalopathy-likely metabolic encephalopathy secondary to polypharmacy. - CT head was negative for acute pathology, ammonia level was normal.  Urine drug screen was also negative. Patient mental status much improved and close to baseline now.  4.  Chronic metastatic renal cell carcinoma- patient follows with Dr. Rogue Bussing. -Continue outpatient follow-up.  5.  Diabetes type 2 without complication-  in the hospital patient was on sliding scale insulin but will resume his metformin upon discharge.  6. Hypothyroidism - pt. Will cont. Synthroid.   7. Hyperlipidemia - pt. Will cont. Atorvastatin.   8. Chronic Pain - pt. Will cont. His Oxycodone, Zanaflex.   9. BPH - pt. Will cont. Flomax.  - no urinary retention while in the hospital.   DISCHARGE CONDITIONS:   Stable.   CONSULTS OBTAINED:  Treatment Team:  Jonathon Bellows, MD  DRUG ALLERGIES:   Allergies  Allergen Reactions  . Lisinopril Swelling  . Other Itching    Nitroglycerin Patch.    DISCHARGE MEDICATIONS:   Allergies as of 04/21/2019      Reactions   Lisinopril Swelling   Other Itching   Nitroglycerin Patch.      Medication List    TAKE these medications   albuterol  108 (90 Base) MCG/ACT inhaler Commonly known as:  VENTOLIN HFA Inhale 2 puffs into the lungs every 6 (six) hours as needed for wheezing or shortness of breath.   aspirin EC 81 MG tablet Take 81 mg by mouth daily.   atorvastatin 40 MG tablet Commonly known as:  LIPITOR Take 1 tablet (40 mg total) by mouth daily.   BC HEADACHE POWDER PO Take 1 packet by mouth daily.   busPIRone 5 MG tablet Commonly known as:  BUSPAR Take 5 mg by mouth 2 (two) times daily.    cholecalciferol 10 MCG (400 UNIT) Tabs tablet Commonly known as:  VITAMIN D3 Take 400 Units by mouth daily.   clopidogrel 75 MG tablet Commonly known as:  PLAVIX Take 75 mg by mouth daily. Notes to patient:  Hold for the next 5 days, can restart on Sunday   [employer] Citrate of Magnesia Soln Generic drug:  magnesium citrate Take 15 mLs by mouth as needed for constipation.   DULoxetine 30 MG capsule Commonly known as:  CYMBALTA Take 2 capsules by mouth daily.   fentaNYL 75 MCG/HR Commonly known as:  [place] 1 patch onto the skin every 3 (three) days.   Fish Oil 1000 MG Cpdr Take 1 capsule by mouth daily.   fluticasone 50 MCG/ACT nasal spray Commonly known as:  Flonase Place 2 sprays into both nostrils daily.   Glucosamine Chond Complex/MSM Tabs Take 2 tablets by mouth daily.   ipratropium-albuterol 0.5-2.5 (3) MG/3ML Soln Commonly known as:  DUONEB Take 3 mLs by nebulization every 4 (four) hours as needed.   levothyroxine 75 MCG tablet Commonly known as:  Synthroid Take 1 tablet (75 mcg total) by mouth daily before breakfast.   loperamide 1 MG/5ML solution Commonly known as:  IMODIUM Take 4 mg by mouth as needed for diarrhea or loose stools.   loratadine 10 MG tablet Commonly known as:  CLARITIN Take 10 mg by mouth daily.   MELATONIN PO Take 10 mg by mouth at bedtime.   metFORMIN 500 MG tablet Commonly known as:  GLUCOPHAGE TAKE 1 TABLET (500 MG TOTAL) BY MOUTH 2 (TWO) TIMES DAILY WITH A MEAL.   montelukast 10 MG tablet Commonly known as:  SINGULAIR TAKE 1 TABLET BY MOUTH EVERYDAY AT BEDTIME   nitroGLYCERIN 0.4 MG SL tablet Commonly known as:  NITROSTAT Place 1 tablet (0.4 mg total) under the tongue every 5 (five) minutes as needed.   Oxycodone HCl 10 MG Tabs Take 1-2 tablets (10-20 mg total) by mouth every 4 (four) hours as needed for breakthrough pain.   OXYGEN Inhale 2 L into the lungs at bedtime.   pantoprazole 40 MG tablet Commonly known  as:  PROTONIX Take 1 tablet (40 mg total) by mouth 2 (two) times daily before a meal.   tamsulosin 0.4 MG Caps capsule Commonly known as:  FLOMAX TAKE 1 CAPSULE (0.4 MG TOTAL) BY MOUTH EVERY MORNING.   temazepam 7.5 MG capsule Commonly known as:  RESTORIL Take 1 capsule (7.5 mg total) by mouth at bedtime as needed for sleep.   Tiotropium Bromide Monohydrate 1.25 MCG/ACT Aers Commonly known as:  Spiriva Respimat Inhale 1.25 Act into the lungs 2 (two) times daily.   tiZANidine 4 MG tablet Commonly known as:  ZANAFLEX Take 4 mg by mouth daily.   Turmeric 500 MG Tabs Take 1 tablet by mouth daily.         DISCHARGE INSTRUCTIONS:   DIET:  Cardiac diet and Diabetic diet  DISCHARGE  CONDITION:  Stable  ACTIVITY:  Activity as tolerated  OXYGEN:  Home Oxygen: No.   Oxygen Delivery: room air  DISCHARGE LOCATION:  home   If you experience worsening of your admission symptoms, develop shortness of breath, life threatening emergency, suicidal or homicidal thoughts you must seek medical attention immediately by calling 911 or calling your MD immediately  if symptoms less severe.  You Must read complete instructions/literature along with all the possible adverse reactions/side effects for all the Medicines you take and that have been prescribed to you. Take any new Medicines after you have completely understood and accpet all the possible adverse reactions/side effects.   Please note  You were cared for by a hospitalist during your hospital stay. If you have any questions about your discharge medications or the care you received while you were in the hospital after you are discharged, you can call the unit and asked to speak with the hospitalist on call if the hospitalist that took care of you is not available. Once you are discharged, your primary care physician will handle any further medical issues. Please note that NO REFILLS for any discharge medications will be authorized  once you are discharged, as it is imperative that you return to your primary care physician (or establish a relationship with a primary care physician if you do not have one) for your aftercare needs so that they can reassess your need for medications and monitor your lab values.     Today   No acute events overnight.  Patient is status post endoscopy today showing gastric polyps which were biopsied and clipped.  No acute bleeding overnight.  Patient is tolerating p.o. well.  He denies any chest pain shortness of breath or abdominal pain.  Will discharge home today.  VITAL SIGNS:  Blood pressure 111/63, pulse 71, temperature 98.7 F (37.1 C), temperature source Oral, resp. rate 18, height 6\' 1"  (1.854 m), weight 79.4 kg, SpO2 97 %.  I/O:    Intake/Output Summary (Last 24 hours) at 04/21/2019 1229 Last data filed at 04/21/2019 0600 Gross per 24 hour  Intake 505 ml  Output -  Net 505 ml    PHYSICAL EXAMINATION:    GENERAL:  78 y.o.-year-old patient lying in bed in no acute distress.  EYES: Pupils equal, round, reactive to light and accommodation. No scleral icterus. Extraocular muscles intact.  HEENT: Head atraumatic, normocephalic. Oropharynx and nasopharynx clear.  NECK:  Supple, no jugular venous distention. No thyroid enlargement, no tenderness.  LUNGS: Normal breath sounds bilaterally, no wheezing, rales, rhonchi. No use of accessory muscles of respiration.  CARDIOVASCULAR: S1, S2 normal. No murmurs, rubs, or gallops.  ABDOMEN: Soft, nontender, nondistended. Bowel sounds present. No organomegaly or mass.  EXTREMITIES: No cyanosis, clubbing or edema b/l.    NEUROLOGIC: Cranial nerves II through XII are intact. No focal Motor or sensory deficits b/l.   PSYCHIATRIC: The patient is alert and oriented x 3.  SKIN: No obvious rash, lesion, or ulcer.  DATA REVIEW:   CBC Recent Labs  Lab 04/21/19 0553  WBC 5.3  HGB 7.9*  HCT 24.3*  PLT 140*    Chemistries  Recent Labs  Lab  04/16/19 1616  04/20/19 0345  NA 140   < > 136  K 4.2   < > 3.6  CL 105   < > 106  CO2 22   < > 25  GLUCOSE 123*   < > 129*  BUN 49*   < > 14  CREATININE 0.99   < > 1.02  CALCIUM 8.9   < > 7.7*  AST 17  --   --   ALT 12  --   --   ALKPHOS 63  --   --   BILITOT 0.6  --   --    < > = values in this interval not displayed.    Cardiac Enzymes Recent Labs  Lab 04/16/19 1616  TROPONINI <0.03    Microbiology Results  Results for orders placed or performed during the hospital encounter of 04/16/19  SARS Coronavirus 2 (CEPHEID - Performed in [place] hospital lab), Hosp Order     Status: None   Collection Time: 04/16/19  4:17 PM  Result Value Ref Range Status   SARS Coronavirus 2 NEGATIVE NEGATIVE Final    Comment: (NOTE) If result is NEGATIVE SARS-CoV-2 target nucleic acids are NOT DETECTED. The SARS-CoV-2 RNA is generally detectable in upper and lower  respiratory specimens during the acute phase of infection. The lowest  concentration of SARS-CoV-2 viral copies this assay can detect is 250  copies / mL. A negative result does not preclude SARS-CoV-2 infection  and should not be used as the sole basis for treatment or other  patient management decisions.  A negative result may occur with  improper specimen collection / handling, submission of specimen other  than nasopharyngeal swab, presence of viral mutation(s) within the  areas targeted by this assay, and inadequate number of viral copies  (<250 copies / mL). A negative result must be combined with clinical  observations, patient history, and epidemiological information. If result is POSITIVE SARS-CoV-2 target nucleic acids are DETECTED. The SARS-CoV-2 RNA is generally detectable in upper and lower  respiratory specimens dur ing the acute phase of infection.  Positive  results are indicative of active infection with SARS-CoV-2.  Clinical  correlation with patient history and other diagnostic information is   necessary to determine patient infection status.  Positive results do  not rule out bacterial infection or co-infection with other viruses. If result is PRESUMPTIVE POSTIVE SARS-CoV-2 nucleic acids MAY BE PRESENT.   A presumptive positive result was obtained on the submitted specimen  and confirmed on repeat testing.  While 2019 novel coronavirus  (SARS-CoV-2) nucleic acids may be present in the submitted sample  additional confirmatory testing may be necessary for epidemiological  and / or clinical management purposes  to differentiate between  SARS-CoV-2 and other Sarbecovirus currently known to infect humans.  If clinically indicated additional testing with an alternate test  methodology [phone]) is advised. The SARS-CoV-2 RNA is generally  detectable in upper and lower respiratory sp ecimens during the acute  phase of infection. The expected result is Negative. Fact Sheet for Patients:  StrictlyIdeas.no Fact Sheet for Healthcare Providers: BankingDealers.co.za This test is not yet approved or cleared by the [place] FDA and has been authorized for detection and/or diagnosis of SARS-CoV-2 by FDA under an Emergency Use Authorization (EUA).  This EUA will remain in effect (meaning this test can be used) for the duration of the COVID-19 declaration under Section 564(b)(1) of the Act, 21 U.S.C. section 360bbb-3(b)(1), unless the authorization is terminated or revoked sooner. Performed at [facility], [address] [place], [place] [address]   MRSA PCR Screening     Status: None   Collection Time: 04/16/19  9:39 PM  Result Value Ref Range Status   MRSA by PCR NEGATIVE NEGATIVE Final    Comment:  The GeneXpert MRSA Assay (FDA approved for NASAL specimens only), is one component of a comprehensive MRSA colonization surveillance program. It is not intended to diagnose MRSA infection nor to guide or monitor  treatment for MRSA infections. Performed at [facility], [address] [place], [place] [address]     RADIOLOGY:  No results found.    Management plans discussed with the patient, family and they are in agreement.  CODE STATUS:     Code Status Orders  (From admission, onward)         Start     Ordered   04/16/19 2019  Full code  Continuous     04/16/19 2018          TOTAL TIME TAKING CARE OF THIS PATIENT: 40 minutes.    Henreitta Leber M.D on 04/21/2019 at 12:29 PM  Between 7am to 6pm - Pager - [phone]  After 6pm go to [email or web address] - Proofreader  [facility]  Office  [phone]  CC: Primary care physician; Dion Body, MD

## 2019-04-22 ENCOUNTER — Inpatient Hospital Stay
Admission: EM | Admit: 2019-04-22 | Discharge: 2019-04-24 | DRG: 812 | Disposition: A | Discharge status: Hospice | Payer: PPO | Source: Ambulatory Visit | Attending: Internal Medicine | Admitting: Internal Medicine

## 2019-04-22 ENCOUNTER — Other Ambulatory Visit: Payer: Self-pay

## 2019-04-22 ENCOUNTER — Encounter: Payer: Self-pay | Admitting: Emergency Medicine

## 2019-04-22 DIAGNOSIS — E039 Hypothyroidism, unspecified: Secondary | ICD-10-CM | POA: Diagnosis present

## 2019-04-22 DIAGNOSIS — Z8249 Family history of ischemic heart disease and other diseases of the circulatory system: Secondary | ICD-10-CM | POA: Diagnosis not present

## 2019-04-22 DIAGNOSIS — C7889 Secondary malignant neoplasm of other digestive organs: Secondary | ICD-10-CM | POA: Diagnosis present

## 2019-04-22 DIAGNOSIS — G9340 Encephalopathy, unspecified: Secondary | ICD-10-CM | POA: Diagnosis present

## 2019-04-22 DIAGNOSIS — Z9221 Personal history of antineoplastic chemotherapy: Secondary | ICD-10-CM | POA: Diagnosis not present

## 2019-04-22 DIAGNOSIS — Z955 Presence of coronary angioplasty implant and graft: Secondary | ICD-10-CM | POA: Diagnosis not present

## 2019-04-22 DIAGNOSIS — Z807 Family history of other malignant neoplasms of lymphoid, hematopoietic and related tissues: Secondary | ICD-10-CM

## 2019-04-22 DIAGNOSIS — M25512 Pain in left shoulder: Secondary | ICD-10-CM

## 2019-04-22 DIAGNOSIS — I959 Hypotension, unspecified: Secondary | ICD-10-CM | POA: Diagnosis present

## 2019-04-22 DIAGNOSIS — Z7189 Other specified counseling: Secondary | ICD-10-CM | POA: Diagnosis not present

## 2019-04-22 DIAGNOSIS — D62 Acute posthemorrhagic anemia: Principal | ICD-10-CM | POA: Diagnosis present

## 2019-04-22 DIAGNOSIS — M79605 Pain in left leg: Secondary | ICD-10-CM | POA: Diagnosis not present

## 2019-04-22 DIAGNOSIS — C7951 Secondary malignant neoplasm of bone: Secondary | ICD-10-CM | POA: Diagnosis present

## 2019-04-22 DIAGNOSIS — Z1159 Encounter for screening for other viral diseases: Secondary | ICD-10-CM

## 2019-04-22 DIAGNOSIS — Z515 Encounter for palliative care: Secondary | ICD-10-CM | POA: Diagnosis present

## 2019-04-22 DIAGNOSIS — I2510 Atherosclerotic heart disease of native coronary artery without angina pectoris: Secondary | ICD-10-CM | POA: Diagnosis present

## 2019-04-22 DIAGNOSIS — R627 Adult failure to thrive: Secondary | ICD-10-CM | POA: Diagnosis present

## 2019-04-22 DIAGNOSIS — Z905 Acquired absence of kidney: Secondary | ICD-10-CM | POA: Diagnosis not present

## 2019-04-22 DIAGNOSIS — Z03818 Encounter for observation for suspected exposure to other biological agents ruled out: Secondary | ICD-10-CM | POA: Diagnosis not present

## 2019-04-22 DIAGNOSIS — G893 Neoplasm related pain (acute) (chronic): Secondary | ICD-10-CM | POA: Diagnosis not present

## 2019-04-22 DIAGNOSIS — C7800 Secondary malignant neoplasm of unspecified lung: Secondary | ICD-10-CM

## 2019-04-22 DIAGNOSIS — R4182 Altered mental status, unspecified: Secondary | ICD-10-CM

## 2019-04-22 DIAGNOSIS — Z66 Do not resuscitate: Secondary | ICD-10-CM | POA: Diagnosis present

## 2019-04-22 DIAGNOSIS — C649 Malignant neoplasm of unspecified kidney, except renal pelvis: Secondary | ICD-10-CM | POA: Diagnosis present

## 2019-04-22 DIAGNOSIS — I10 Essential (primary) hypertension: Secondary | ICD-10-CM | POA: Diagnosis present

## 2019-04-22 DIAGNOSIS — K921 Melena: Secondary | ICD-10-CM | POA: Diagnosis present

## 2019-04-22 DIAGNOSIS — C642 Malignant neoplasm of left kidney, except renal pelvis: Secondary | ICD-10-CM | POA: Diagnosis present

## 2019-04-22 DIAGNOSIS — D630 Anemia in neoplastic disease: Secondary | ICD-10-CM | POA: Diagnosis not present

## 2019-04-22 DIAGNOSIS — D500 Iron deficiency anemia secondary to blood loss (chronic): Secondary | ICD-10-CM | POA: Diagnosis not present

## 2019-04-22 DIAGNOSIS — K922 Gastrointestinal hemorrhage, unspecified: Secondary | ICD-10-CM | POA: Diagnosis not present

## 2019-04-22 DIAGNOSIS — J449 Chronic obstructive pulmonary disease, unspecified: Secondary | ICD-10-CM | POA: Diagnosis present

## 2019-04-22 DIAGNOSIS — R195 Other fecal abnormalities: Secondary | ICD-10-CM | POA: Diagnosis not present

## 2019-04-22 DIAGNOSIS — R441 Visual hallucinations: Secondary | ICD-10-CM | POA: Diagnosis not present

## 2019-04-22 LAB — PROTIME-INR
INR: 1.1 (ref 0.8–1.2)
Prothrombin Time: 14.5 seconds (ref 11.4–15.2)

## 2019-04-22 LAB — URINALYSIS, COMPLETE (UACMP) WITH MICROSCOPIC
Bacteria, UA: NONE SEEN
Bilirubin Urine: NEGATIVE
Glucose, UA: NEGATIVE mg/dL
Hgb urine dipstick: NEGATIVE
Ketones, ur: NEGATIVE mg/dL
Leukocytes,Ua: NEGATIVE
Nitrite: NEGATIVE
Protein, ur: NEGATIVE mg/dL
Specific Gravity, Urine: 1.013 (ref 1.005–1.030)
Squamous Epithelial / HPF: NONE SEEN (ref 0–5)
pH: 5 (ref 5.0–8.0)

## 2019-04-22 LAB — COMPREHENSIVE METABOLIC PANEL
ALT: 14 U/L (ref 0–44)
AST: 19 U/L (ref 15–41)
Albumin: 3.6 g/dL (ref 3.5–5.0)
Alkaline Phosphatase: 74 U/L (ref 38–126)
Anion gap: 7 (ref 5–15)
BUN: 11 mg/dL (ref 8–23)
CO2: 24 mmol/L (ref 22–32)
Calcium: 7.9 mg/dL — ABNORMAL LOW (ref 8.9–10.3)
Chloride: 107 mmol/L (ref 98–111)
Creatinine, Ser: 1.17 mg/dL (ref 0.61–1.24)
GFR calc Af Amer: >60 mL/min (ref >60)
GFR calc non Af Amer: 60 mL/min — ABNORMAL LOW (ref >60)
Glucose, Bld: 91 mg/dL (ref 70–99)
Potassium: 3.2 mmol/L — ABNORMAL LOW (ref 3.5–5.1)
Sodium: 138 mmol/L (ref 135–145)
Total Bilirubin: 0.9 mg/dL (ref 0.3–1.2)
Total Protein: 6.2 g/dL — ABNORMAL LOW (ref 6.5–8.1)

## 2019-04-22 LAB — TROPONIN I: Troponin I: <0.03 ng/mL (ref <0.03)

## 2019-04-22 LAB — CBC WITH DIFFERENTIAL/PLATELET
Abs Immature Granulocytes: 0.03 10*3/uL (ref 0.00–0.07)
Basophils Absolute: 0 10*3/uL (ref 0.0–0.1)
Basophils Relative: 1 %
Eosinophils Absolute: 0.3 10*3/uL (ref 0.0–0.5)
Eosinophils Relative: 3 %
HCT: 23.6 % — ABNORMAL LOW (ref 39.0–52.0)
Hemoglobin: 7.7 g/dL — ABNORMAL LOW (ref 13.0–17.0)
Immature Granulocytes: 0 %
Lymphocytes Relative: 7 %
Lymphs Abs: 0.6 10*3/uL — ABNORMAL LOW (ref 0.7–4.0)
MCH: 30.1 pg (ref 26.0–34.0)
MCHC: 32.6 g/dL (ref 30.0–36.0)
MCV: 92.2 fL (ref 80.0–100.0)
Monocytes Absolute: 0.9 10*3/uL (ref 0.1–1.0)
Monocytes Relative: 10 %
Neutro Abs: 6.7 10*3/uL (ref 1.7–7.7)
Neutrophils Relative %: 79 %
Platelets: 150 10*3/uL (ref 150–400)
RBC: 2.56 MIL/uL — ABNORMAL LOW (ref 4.22–5.81)
RDW: 16.4 % — ABNORMAL HIGH (ref 11.5–15.5)
WBC: 8.6 10*3/uL (ref 4.0–10.5)
nRBC: 0 % (ref 0.0–0.2)

## 2019-04-22 LAB — SARS CORONAVIRUS 2 BY RT PCR (HOSPITAL ORDER, PERFORMED IN ~~LOC~~ HOSPITAL LAB): SARS Coronavirus 2: NEGATIVE

## 2019-04-22 LAB — HEMOGLOBIN: Hemoglobin: 7.5 g/dL — ABNORMAL LOW (ref 13.0–17.0)

## 2019-04-22 LAB — SURGICAL PATHOLOGY

## 2019-04-22 MED ORDER — TIZANIDINE HCL 4 MG PO TABS
4.0000 mg | ORAL_TABLET | Freq: Every day | ORAL | Status: DC
  Administered 2019-04-23 – 2019-04-24 (×2): 4 mg via ORAL
  Filled 2019-04-22 (×2): qty 1

## 2019-04-22 MED ORDER — TIOTROPIUM BROMIDE MONOHYDRATE 18 MCG IN CAPS
1.0000 | ORAL_CAPSULE | Freq: Every day | RESPIRATORY_TRACT | Status: DC
  Administered 2019-04-23 – 2019-04-24 (×2): 18 ug via RESPIRATORY_TRACT
  Filled 2019-04-22: qty 5

## 2019-04-22 MED ORDER — NITROGLYCERIN 0.4 MG SL SUBL: 0.4000 mg | SUBLINGUAL_TABLET | SUBLINGUAL | Status: DC | PRN

## 2019-04-22 MED ORDER — POLYETHYLENE GLYCOL 3350 17 G PO PACK: 17.0000 g | PACK | Freq: Every day | ORAL | Status: DC | PRN

## 2019-04-22 MED ORDER — TAMSULOSIN HCL 0.4 MG PO CAPS
0.4000 mg | ORAL_CAPSULE | ORAL | Status: DC
  Administered 2019-04-23 – 2019-04-24 (×2): 0.4 mg via ORAL
  Filled 2019-04-22 (×3): qty 1

## 2019-04-22 MED ORDER — ONDANSETRON HCL 4 MG/2ML IJ SOLN: 4.0000 mg | Freq: Four times a day (QID) | INTRAMUSCULAR | Status: DC | PRN

## 2019-04-22 MED ORDER — MELATONIN 5 MG PO TABS
10.0000 mg | ORAL_TABLET | Freq: Every day | ORAL | Status: DC
  Administered 2019-04-22 – 2019-04-23 (×2): 10 mg via ORAL
  Filled 2019-04-22 (×3): qty 2

## 2019-04-22 MED ORDER — ACETAMINOPHEN 325 MG PO TABS: 650.0000 mg | ORAL_TABLET | Freq: Four times a day (QID) | ORAL | Status: DC | PRN

## 2019-04-22 MED ORDER — FLUTICASONE PROPIONATE 50 MCG/ACT NA SUSP
2.0000 | Freq: Every day | NASAL | Status: DC
  Administered 2019-04-23 – 2019-04-24 (×2): 2 via NASAL
  Filled 2019-04-22: qty 16

## 2019-04-22 MED ORDER — ALBUTEROL SULFATE (2.5 MG/3ML) 0.083% IN NEBU: 2.5000 mg | INHALATION_SOLUTION | RESPIRATORY_TRACT | Status: DC | PRN

## 2019-04-22 MED ORDER — ONDANSETRON HCL 4 MG PO TABS: 4.0000 mg | ORAL_TABLET | Freq: Four times a day (QID) | ORAL | Status: DC | PRN

## 2019-04-22 MED ORDER — TEMAZEPAM 7.5 MG PO CAPS
7.5000 mg | ORAL_CAPSULE | Freq: Every evening | ORAL | Status: DC | PRN
  Filled 2019-04-22: qty 1

## 2019-04-22 MED ORDER — OXYCODONE HCL 5 MG PO TABS: 10.0000 mg | ORAL_TABLET | ORAL | Status: DC | PRN

## 2019-04-22 MED ORDER — ACETAMINOPHEN 650 MG RE SUPP: 650.0000 mg | Freq: Four times a day (QID) | RECTAL | Status: DC | PRN

## 2019-04-22 MED ORDER — MONTELUKAST SODIUM 10 MG PO TABS
10.0000 mg | ORAL_TABLET | Freq: Every day | ORAL | Status: DC
  Administered 2019-04-22 – 2019-04-23 (×2): 10 mg via ORAL
  Filled 2019-04-22 (×2): qty 1

## 2019-04-22 MED ORDER — POTASSIUM CHLORIDE CRYS ER 20 MEQ PO TBCR
40.0000 meq | EXTENDED_RELEASE_TABLET | Freq: Once | ORAL | Status: AC
  Administered 2019-04-22: 17:00:00 40 meq via ORAL
  Filled 2019-04-22: qty 2

## 2019-04-22 MED ORDER — ATORVASTATIN CALCIUM 20 MG PO TABS
40.0000 mg | ORAL_TABLET | Freq: Every day | ORAL | Status: DC
  Administered 2019-04-23: 40 mg via ORAL
  Filled 2019-04-22: qty 2

## 2019-04-22 MED ORDER — DOCUSATE SODIUM 100 MG PO CAPS
100.0000 mg | ORAL_CAPSULE | Freq: Two times a day (BID) | ORAL | Status: DC
  Administered 2019-04-22 – 2019-04-24 (×4): 100 mg via ORAL
  Filled 2019-04-22 (×4): qty 1

## 2019-04-22 MED ORDER — BUSPIRONE HCL 5 MG PO TABS
5.0000 mg | ORAL_TABLET | Freq: Two times a day (BID) | ORAL | Status: DC
  Administered 2019-04-22 – 2019-04-24 (×4): 5 mg via ORAL
  Filled 2019-04-22 (×5): qty 1

## 2019-04-22 MED ORDER — ENOXAPARIN SODIUM 40 MG/0.4ML ~~LOC~~ SOLN: 40.0000 mg | SUBCUTANEOUS | Status: DC

## 2019-04-22 MED ORDER — PANTOPRAZOLE SODIUM 40 MG PO TBEC
40.0000 mg | DELAYED_RELEASE_TABLET | Freq: Two times a day (BID) | ORAL | Status: DC
  Administered 2019-04-23 – 2019-04-24 (×3): 40 mg via ORAL
  Filled 2019-04-22 (×3): qty 1

## 2019-04-22 MED ORDER — LORATADINE 10 MG PO TABS
10.0000 mg | ORAL_TABLET | Freq: Every day | ORAL | Status: DC
  Administered 2019-04-23 – 2019-04-24 (×2): 10 mg via ORAL
  Filled 2019-04-22 (×2): qty 1

## 2019-04-22 MED ORDER — LACTATED RINGERS IV SOLN
INTRAVENOUS | Status: DC
  Administered 2019-04-22 – 2019-04-24 (×4): via INTRAVENOUS

## 2019-04-22 MED ORDER — LEVOTHYROXINE SODIUM 50 MCG PO TABS
75.0000 ug | ORAL_TABLET | Freq: Every day | ORAL | Status: DC
  Administered 2019-04-23 – 2019-04-24 (×2): 75 ug via ORAL
  Filled 2019-04-22 (×2): qty 1

## 2019-04-22 MED ORDER — SODIUM CHLORIDE 0.9 % IV SOLN
1000.0000 mL | Freq: Once | INTRAVENOUS | Status: AC
  Administered 2019-04-22: 12:00:00 1000 mL via INTRAVENOUS

## 2019-04-22 NOTE — ED Notes (Addendum)
ED TO INPATIENT HANDOFF REPORT  ED Nurse Name and Phone #:   Gershon Mussel RN    [phone]  S Name/Age/Gender Glen Davis 78 y.o. male Room/Bed: ED10A/ED10A  Code Status   Code Status: DNR  Home/SNF/Other Home Patient oriented to: self, place and situation Is this baseline? No   Triage Complete: Triage complete  Chief Complaint low bp confusion  Triage Note First RN Note: Pt presents to ED via wheelchair from [facility]. This RN received phone call from [facility] MD regarding patient, [place] MD reports BP at [facility] 170/40, recently D/C from hospital with upper GI bleed, reports at [facility] patient noted to be pale, having halllucinations and some confusion. Per [facility] MD patient also reporting black stools. Upon arrival to ED pt is alert and oriented to person, place, situation and month, disoriented to year at this time.   Pt from [facility] with c/o AMS and black tarry stool. Per spouse states pt confusion xseveral days but worse last night. PT recently discharged from hospital due to rectal bleeding from polyps. Recently had polyps removed. PT A&Ox4. BP 88/50 in traige.    Allergies Allergies  Allergen Reactions  . Lisinopril Swelling  . Other Itching    Nitroglycerin Patch.    Level of Care/Admitting Diagnosis ED Disposition    ED Disposition Condition [place] [facility]: [place] [[id number]]  Level of Care: Med-Surg [16]  Covid Evaluation: N/A  Diagnosis: Hypotension [761950]  Admitting Physician: Hillary Bow [[id number]]  Attending Physician: Hillary Bow [[id number]]  PT Class (Do Not Modify): Observation [104]  PT Acc Code (Do Not Modify): Observation [10022]       B Medical/Surgery History Past Medical History:  Diagnosis Date  . CAD (coronary artery disease)   . Cancer ([place])    left arm  . COPD (chronic obstructive pulmonary disease) ([place])   . Hypertension   . Kidney stone   . Lung cancer ([place])   . Melanoma ([place]) 01/24/2016   right shoulder  . Thyroid nodule  02/02/2016   left BENIGN THYROID NODULE by FNA   Past Surgical History:  Procedure Laterality Date  . arm surgery Left    arm  . cardiac stents  2011   Angioplasty / Stenting Femoral-X2  . COLONOSCOPY  04/01/12  . CORONARY ANGIOPLASTY    . ESOPHAGOGASTRODUODENOSCOPY N/A 04/21/2019   Procedure: ESOPHAGOGASTRODUODENOSCOPY (EGD);  Surgeon: Lin Landsman, MD;  Location: [facility] ENDOSCOPY;  Service: Gastroenterology;  Laterality: N/A;  . EXCISION MELANOMA WITH SENTINEL LYMPH NODE BIOPSY Right 01/24/2016   Procedure: EXCISION MELANOMA Right Shoulder;  Surgeon: Robert Bellow, MD;  Location: [facility] ORS;  Service: General;  Laterality: Right;  . LAPAROSCOPIC NEPHRECTOMY, HAND ASSISTED Left 04/24/2016   Procedure: HAND ASSISTED LAPAROSCOPIC NEPHRECTOMY;  Surgeon: Hollice Espy, MD;  Location: [facility] ORS;  Service: Urology;  Laterality: Left;  . TIBIA IM NAIL INSERTION Left 02/11/2019   Procedure: INTRAMEDULLARY (IM) NAIL TIBIAL;  Surgeon: Lovell Sheehan, MD;  Location: [facility] ORS;  Service: Orthopedics;  Laterality: Left;  Marland Kitchen VIDEO ASSISTED THORACOSCOPY (VATS)/THOROCOTOMY Left 03/08/2016   Procedure: VIDEO ASSISTED THORACOSCOPY (VATS) with lung biopsy - Left ;  Surgeon: Robert Bellow, MD;  Location: [facility] ORS;  Service: General;  Laterality: Left;     A IV Location/Drains/Wounds Patient Lines/Drains/Airways Status   Active Line/Drains/Airways    Name:   Placement date:   Placement time:   Site:   Days:   Peripheral IV 04/22/19 Left Antecubital   04/22/19  1148    Antecubital   less than 1   Incision (Closed) 02/11/19 Knee Left   02/11/19    1232     70          Intake/Output Last 24 hours  Intake/Output Summary (Last 24 hours) at 04/22/2019 2032 Last data filed at 04/22/2019 1250 Gross per 24 hour  Intake 1000 ml  Output -  Net 1000 ml    Labs/Imaging Results for orders placed or performed during the hospital encounter of 04/22/19 (from the past 48 hour(s))  CBC with Differential      Status: Abnormal   Collection Time: 04/22/19 11:46 AM  Result Value Ref Range   WBC 8.6 4.0 - 10.5 K/uL   RBC 2.56 (L) 4.22 - 5.81 MIL/uL   Hemoglobin 7.7 (L) 13.0 - 17.0 g/dL   HCT 23.6 (L) 39.0 - 52.0 %   MCV 92.2 80.0 - 100.0 fL   MCH 30.1 26.0 - 34.0 pg   MCHC 32.6 30.0 - 36.0 g/dL   RDW 16.4 (H) 11.5 - 15.5 %   Platelets 150 150 - 400 K/uL   nRBC 0.0 0.0 - 0.2 %   Neutrophils Relative % 79 %   Neutro Abs 6.7 1.7 - 7.7 K/uL   Lymphocytes Relative 7 %   Lymphs Abs 0.6 (L) 0.7 - 4.0 K/uL   Monocytes Relative 10 %   Monocytes Absolute 0.9 0.1 - 1.0 K/uL   Eosinophils Relative 3 %   Eosinophils Absolute 0.3 0.0 - 0.5 K/uL   Basophils Relative 1 %   Basophils Absolute 0.0 0.0 - 0.1 K/uL   Immature Granulocytes 0 %   Abs Immature Granulocytes 0.03 0.00 - 0.07 K/uL    Comment: Performed at [facility], [address] [place], [place] [address]  Comprehensive metabolic panel     Status: Abnormal   Collection Time: 04/22/19 11:46 AM  Result Value Ref Range   Sodium 138 135 - 145 mmol/L   Potassium 3.2 (L) 3.5 - 5.1 mmol/L   Chloride 107 98 - 111 mmol/L   CO2 24 22 - 32 mmol/L   Glucose, Bld 91 70 - 99 mg/dL   BUN 11 8 - 23 mg/dL   Creatinine, Ser 1.17 0.61 - 1.24 mg/dL   Calcium 7.9 (L) 8.9 - 10.3 mg/dL   Total Protein 6.2 (L) 6.5 - 8.1 g/dL   Albumin 3.6 3.5 - 5.0 g/dL   AST 19 15 - 41 U/L   ALT 14 0 - 44 U/L   Alkaline Phosphatase 74 38 - 126 U/L   Total Bilirubin 0.9 0.3 - 1.2 mg/dL   GFR calc non Af Amer 60 (L) >60 mL/min   GFR calc Af Amer >60 >60 mL/min   Anion gap 7 5 - 15    Comment: Performed at [facility], [address] [place], [place] [address]  Troponin I - ONCE - STAT     Status: None   Collection Time: 04/22/19 11:46 AM  Result Value Ref Range   Troponin I <0.03 <0.03 ng/mL    Comment: Performed at [facility], [address] [place], [place] [address]  Protime-INR     Status: None   Collection Time: 04/22/19 11:46  AM  Result Value Ref Range   Prothrombin Time 14.5 11.4 - 15.2 seconds   INR 1.1 0.8 - 1.2    Comment: (NOTE) INR goal varies based on device and disease states. Performed at [facility], [phone]  [address] [place], [place] [address]   Type and screen     Status: None   Collection Time: 04/22/19 11:48 AM  Result Value Ref Range   ABO/RH(D) O POS    Antibody Screen NEG    Sample Expiration      04/25/2019,2359 Performed at [facility], [address] [place], [place] [address]   Urinalysis, Complete w Microscopic     Status: Abnormal   Collection Time: 04/22/19  2:13 PM  Result Value Ref Range   Color, Urine YELLOW (A) YELLOW   APPearance CLEAR (A) CLEAR   Specific Gravity, Urine 1.013 1.005 - 1.030   pH 5.0 5.0 - 8.0   Glucose, UA NEGATIVE NEGATIVE mg/dL   Hgb urine dipstick NEGATIVE NEGATIVE   Bilirubin Urine NEGATIVE NEGATIVE   Ketones, ur NEGATIVE NEGATIVE mg/dL   Protein, ur NEGATIVE NEGATIVE mg/dL   Nitrite NEGATIVE NEGATIVE   Leukocytes,Ua NEGATIVE NEGATIVE   RBC / HPF 0-5 0 - 5 RBC/hpf   WBC, UA 0-5 0 - 5 WBC/hpf   Bacteria, UA NONE SEEN NONE SEEN   Squamous Epithelial / LPF NONE SEEN 0 - 5   Mucus PRESENT     Comment: Performed at [facility], [address] [place], [place] [address]  SARS Coronavirus 2 (CEPHEID - Performed in [place] hospital lab), Hosp Order     Status: None   Collection Time: 04/22/19  2:13 PM  Result Value Ref Range   SARS Coronavirus 2 NEGATIVE NEGATIVE    Comment: (NOTE) If result is NEGATIVE SARS-CoV-2 target nucleic acids are NOT DETECTED. The SARS-CoV-2 RNA is generally detectable in upper and lower  respiratory specimens during the acute phase of infection. The lowest  concentration of SARS-CoV-2 viral copies this assay can detect is 250  copies / mL. A negative result does not preclude SARS-CoV-2 infection  and should not be used as the sole basis for treatment or other  patient management  decisions.  A negative result may occur with  improper specimen collection / handling, submission of specimen other  than nasopharyngeal swab, presence of viral mutation(s) within the  areas targeted by this assay, and inadequate number of viral copies  (<250 copies / mL). A negative result must be combined with clinical  observations, patient history, and epidemiological information. If result is POSITIVE SARS-CoV-2 target nucleic acids are DETECTED. The SARS-CoV-2 RNA is generally detectable in upper and lower  respiratory specimens dur ing the acute phase of infection.  Positive  results are indicative of active infection with SARS-CoV-2.  Clinical  correlation with patient history and other diagnostic information is  necessary to determine patient infection status.  Positive results do  not rule out bacterial infection or co-infection with other viruses. If result is PRESUMPTIVE POSTIVE SARS-CoV-2 nucleic acids MAY BE PRESENT.   A presumptive positive result was obtained on the submitted specimen  and confirmed on repeat testing.  While 2019 novel coronavirus  (SARS-CoV-2) nucleic acids may be present in the submitted sample  additional confirmatory testing may be necessary for epidemiological  and / or clinical management purposes  to differentiate between  SARS-CoV-2 and other Sarbecovirus currently known to infect humans.  If clinically indicated additional testing with an alternate test  methodology [phone]) is advised. The SARS-CoV-2 RNA is generally  detectable in upper and lower respiratory sp ecimens during the acute  phase of infection. The expected result is Negative. Fact Sheet for Patients:  StrictlyIdeas.no Fact Sheet for Healthcare  Providers: BankingDealers.co.za This test is not yet approved or cleared by the [place] and has been authorized for detection and/or diagnosis of SARS-CoV-2 by FDA under an  Emergency Use Authorization (EUA).  This EUA will remain in effect (meaning this test can be used) for the duration of the COVID-19 declaration under Section 564(b)(1) of the Act, 21 U.S.C. section 360bbb-3(b)(1), unless the authorization is terminated or revoked sooner. Performed at [facility], [address] [place], [place] [address]    No results found.  Pending Labs Unresulted Labs (From admission, onward)    Start     Ordered   04/23/19 5188  Basic metabolic panel  Tomorrow morning,   STAT     04/22/19 1545   04/23/19 0500  CBC  Tomorrow morning,   STAT     04/22/19 1545   04/22/19 2000  Hemoglobin  Once-Timed,   STAT     04/22/19 1545          Vitals/Pain Today's Vitals   04/22/19 1530 04/22/19 1817 04/22/19 1900 04/22/19 2000  BP: (!) 89/56 (!) 97/57 (!) 94/59 100/63  Pulse: 60 73 67 66  Resp: 12 (!) 21 12 11   Temp:      TempSrc:      SpO2: 94% 100% 100% 100%  PainSc:        Isolation Precautions No active isolations  Medications Medications  acetaminophen (TYLENOL) tablet 650 mg (has no administration in time range)    Or  acetaminophen (TYLENOL) suppository 650 mg (has no administration in time range)  polyethylene glycol (MIRALAX / GLYCOLAX) packet 17 g (has no administration in time range)  albuterol (PROVENTIL) (2.5 MG/3ML) 0.083% nebulizer solution 2.5 mg (has no administration in time range)  ondansetron (ZOFRAN) tablet 4 mg (has no administration in time range)    Or  ondansetron (ZOFRAN) injection 4 mg (has no administration in time range)  docusate sodium (COLACE) capsule 100 mg (100 mg Oral Given 04/22/19 1818)  lactated ringers infusion ( Intravenous New Bag/Given 04/22/19 1711)  0.9 %  sodium chloride infusion (0 mLs Intravenous Stopped 04/22/19 1250)  potassium chloride SA (K-DUR) CR tablet 40 mEq (40 mEq Oral Given 04/22/19 1708)    Mobility walks with device High fall risk   Focused Assessments Cardiac Assessment  Handoff:  Cardiac Rhythm: Normal sinus rhythm Lab Results  Component Value Date   TROPONINI <0.03 04/22/2019   No results found for: DDIMER Does the Patient currently have chest pain? No     R Recommendations: See Admitting Provider Note  Report given to:  Kennyth Lose RN on 1C  Additional Notes:

## 2019-04-22 NOTE — Progress Notes (Addendum)
Advance care planning  Purpose of Encounter Anemia, hypotension  Parties in Attendance Patient, and wife  Patients Decisional capacity Alert and oriented.  Able to make medical decisions.  His documented healthcare power of attorney is his wife.  Discussed in detail regarding anemia, hypertension.  Treatment plan , prognosis discussed.  All questions answered.  Patient had DO NOT RESUSCITATE in the past.  During recent admission noticed that he had full code.  Confirmed and wife tells me that he is DNR DO NOT RESUSCITATE.  Explained DO NOT RESUSCITATE/DO NOT INTUBATE in detail and the request aggressive medical care but DNR/DNI.  Orders entered and CODE STATUS changed  DNR/DNI  Time spent - 17 minutes

## 2019-04-22 NOTE — ED Notes (Signed)
PT resting at this time. Respirations even and unlabored. VSS. +2 radial pulses.

## 2019-04-22 NOTE — ED Notes (Signed)
Spouse updated.

## 2019-04-22 NOTE — Assessment & Plan Note (Addendum)
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  Most recent CT scan/ May 2020 showed slightly increasing lung metastases increasing by few millimeters; otherwise overall stable bone mets.  Patient currently status post cycle #5 of Ipi+Nivo appx 2 weeks ago.  Further therapy will depend upon patient's recovery/performance status etc.  #Acute mental status changes/hypotension-unclear etiology.  Patient has a history of labile blood pressures for which he has been [place] in the past.  However this is been discontinued because of poor tolerance [? abdominal pain].  Currently back to baseline.  If mentation does not improve recommend repeat imaging.  Clinically no concern for stroke.  Clinically do not think patient mental status changes related to his immunotherapy.  # Left shoulder pain/ left leg pain-secondary metastasis.  Currently improved.  Continue off narcotics given the mental status changes.  #Recent upper GI bleed-status post endoscopy; monitor hemoglobin closely.  Hemoglobin today 7.7 close to baseline at discharge.  #Overall prognosis is poor from the standpoint of renal cell carcinoma.  Patient status post multiple lines of therapies-most recently immunotherapy.  Had a long discussion with patient's wife and reviewed in general his overall poor prognosis.  For now would recommend current supportive care.  Agree with DNR/DNI.  Thank you Dr.Sudini for allowing me to participate in the care of your pleasant patient. Please do not hesitate to contact me with questions or concerns in the interim.

## 2019-04-22 NOTE — ED Notes (Signed)
PT equipped with fall socks, fall band, call bell, bed alarm, bed reclined back, door to remain open and pt educated to not get up without staff present.

## 2019-04-22 NOTE — Consult Note (Signed)
[facility] CONSULT NOTE  Patient Care Team: Dion Body, MD as PCP - General (Family Medicine) Jannet Mantis, MD (Dermatology) Bary Castilla, Forest Gleason, MD (General Surgery) Hollice Espy, MD as Consulting Physician (Urology) Cammie Sickle, MD as Medical Oncologist (Medical Oncology) Isaias Cowman, MD as Consulting Physician (Cardiology) Harriette Bouillon, MD as Referring Physician (Cardiology) Estevan Oaks, NP as Nurse Practitioner (Hospice and Palliative Medicine)  CHIEF COMPLAINTS/PURPOSE OF CONSULTATION: Metastatic kidney cancer  HISTORY OF PRESENTING ILLNESS:  [place] 78 y.o.  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  As per the wife he was hallucinating the night prior; and given his change in clinical status he was referred from the PCPs office to the ER.  Patient in ER received fluid boluses-currently systolic blood pressures around 106; heart rate around 90s.  Patient's mental status is back to baseline.  Currently patient denies any pain.  Denies any nausea vomiting.  Of note patient was recently admitted to hospital for upper GI bleed underwent EGD-that showed no acute cause of his bleeding.  Patient received PRBC transfusion discharged home.  Patient also had mental status changes the hospital which is attributed delirium.  At the time of discharge it was back to baseline.  Review of Systems  Constitutional: Positive for malaise/fatigue and weight loss. Negative for chills, diaphoresis and fever.  HENT: Negative for nosebleeds and sore throat.   Eyes: Negative for double vision.  Respiratory: Negative for cough, hemoptysis, sputum production, shortness of breath and wheezing.   Cardiovascular: Negative for  chest pain, palpitations, orthopnea and leg swelling.  Gastrointestinal: Negative for abdominal pain, blood in stool, constipation, diarrhea, heartburn, melena, nausea and vomiting.  Genitourinary: Negative for dysuria, frequency and urgency.  Musculoskeletal: Positive for back pain and joint pain.  Skin: Negative.  Negative for itching and rash.  Neurological: Positive for dizziness. Negative for tingling, focal weakness, weakness and headaches.  Endo/Heme/Allergies: Does not bruise/bleed easily.  Psychiatric/Behavioral: Negative for depression. The patient is not nervous/anxious and does not have insomnia.      MEDICAL HISTORY:  Past Medical History:  Diagnosis Date  . CAD (coronary artery disease)   . Cancer ([place])    left arm  . COPD (chronic obstructive pulmonary disease) ([place])   . Hypertension   . Kidney stone   . Lung cancer ([place])   . Melanoma ([place]) 01/24/2016   right shoulder  . Thyroid nodule 02/02/2016   left BENIGN THYROID NODULE by FNA    SURGICAL HISTORY: Past Surgical History:  Procedure Laterality Date  . arm surgery Left    arm  . cardiac stents  2011   Angioplasty / Stenting Femoral-X2  . COLONOSCOPY  04/01/12  . CORONARY ANGIOPLASTY    . ESOPHAGOGASTRODUODENOSCOPY N/A 04/21/2019   Procedure: ESOPHAGOGASTRODUODENOSCOPY (EGD);  Surgeon: Lin Landsman, MD;  Location: [facility] END[facility]COPY;  Service: Gastroenterology;  Laterality: N/A;  . EXCISION MELANOMA WITH SENTINEL LYMPH NODE BIOPSY Right 01/24/2016   Procedure: EXCISION MELANOMA Right Shoulder;  Surgeon: Robert Bellow, MD;  Location: [facility] ORS;  Service: General;  Laterality: Right;  . LAPAROSCOPIC NEPHRECTOMY, HAND ASSISTED Left 04/24/2016   Procedure: HAND ASSISTED LAPAROSCOPIC NEPHRECTOMY;  Surgeon: Hollice Espy, MD;  Location: [facility] ORS;  Service: Urology;  Laterality: Left;  . TIBIA IM NAIL INSERTION Left  02/11/2019   Procedure: INTRAMEDULLARY (IM) NAIL TIBIAL;  Surgeon: Lovell Sheehan, MD;  Location:  [facility] ORS;  Service: Orthopedics;  Laterality: Left;  Marland Kitchen VIDEO ASSISTED THORACOSCOPY (VATS)/THOROCOTOMY Left 03/08/2016   Procedure: VIDEO ASSISTED THORACOSCOPY (VATS) with lung biopsy - Left ;  Surgeon: Robert Bellow, MD;  Location: [facility] ORS;  Service: General;  Laterality: Left;    SOCIAL HISTORY: Social History   Socioeconomic History  . Marital status: Married    Spouse name: Not on file  . Number of children: Not on file  . Years of education: Not on file  . Highest education level: Not on file  Occupational History  . Not on file  Social Needs  . Financial resource strain: Not on file  . Food insecurity:    Worry: Not on file    Inability: Not on file  . Transportation needs:    Medical: Not on file    Non-medical: Not on file  Tobacco Use  . Smoking status: Former Smoker    Packs/day: 2.00    Years: 30.00    Pack years: 60.00    Types: Cigarettes    Start date: 01/18/1956    Last attempt to quit: 01/17/1969    Years since quitting: 50.2  . Smokeless tobacco: Never Used  Substance and Sexual Activity  . Alcohol use: Not Currently    Alcohol/week: 0.0 standard drinks    Comment: BEER OCC  . Drug use: No  . Sexual activity: Yes  Lifestyle  . Physical activity:    Days per week: Not on file    Minutes per session: Not on file  . Stress: Not on file  Relationships  . Social connections:    Talks on phone: Not on file    Gets together: Not on file    Attends religious service: Not on file    Active member of club or organization: Not on file    Attends meetings of clubs or organizations: Not on file    Relationship status: Not on file  . Intimate partner violence:    Fear of current or ex partner: Not on file    Emotionally abused: Not on file    Physically abused: Not on file    Forced sexual activity: Not on file  Other Topics Concern  . Not on file  Social History Narrative  . Not on file    FAMILY HISTORY: Family History  Problem Relation Age of  Onset  . Hodgkin's lymphoma Daughter 5  . Heart attack Father   . Kidney cancer Neg Hx   . Kidney disease Neg Hx   . Prostate cancer Neg Hx     ALLERGIES:  is allergic to lisinopril and other.  MEDICATIONS:  Current Facility-Administered Medications  Medication Dose Route Frequency Provider Last Rate Last Dose  . acetaminophen (TYLENOL) tablet 650 mg  650 mg Oral Q6H PRN Hillary Bow, MD       Or  . acetaminophen (TYLENOL) suppository 650 mg  650 mg Rectal Q6H PRN Sudini, Srikar, MD      . albuterol (PROVENTIL) (2.5 MG/3ML) 0.083% nebulizer solution 2.5 mg  2.5 mg Nebulization Q2H PRN Sudini, Srikar, MD      . docusate sodium (COLACE) capsule 100 mg  100 mg Oral BID Hillary Bow, MD   100 mg at 04/22/19 1818  . lactated ringers infusion   Intravenous Continuous Hillary Bow, MD 75 mL/hr at 04/22/19 1711    . ondansetron (ZOFRAN) tablet 4 mg  4 mg Oral Q6H PRN Hillary Bow, MD       Or  . ondansetron (ZOFRAN) injection 4 mg  4 mg Intravenous Q6H PRN Sudini, Srikar, MD      . polyethylene glycol (MIRALAX / GLYCOLAX) packet 17 g  17 g Oral Daily PRN Hillary Bow, MD       Current Outpatient Medications  Medication Sig Dispense Refill  . albuterol (PROVENTIL HFA;VENTOLIN HFA) 108 (90 Base) MCG/ACT inhaler Inhale 2 puffs into the lungs every 6 (six) hours as needed for wheezing or shortness of breath. 1 Inhaler 2  . aspirin EC 81 MG tablet Take 81 mg by mouth daily.    . Aspirin-Salicylamide-Caffeine (BC HEADACHE POWDER PO) Take 1 packet by mouth daily as needed (headache).     Marland Kitchen atorvastatin (LIPITOR) 40 MG tablet Take 1 tablet (40 mg total) by mouth daily. 30 tablet 3  . busPIRone (BUSPAR) 5 MG tablet Take 5 mg by mouth 2 (two) times daily.    . cholecalciferol (VITAMIN D) 400 units TABS tablet Take 400 Units by mouth daily.    . clopidogrel (PLAVIX) 75 MG tablet Take 75 mg by mouth daily.    . CVS CITRATE OF MAGNESIA SOLN Take 15 mLs by mouth as needed for constipation.    .  DULoxetine (CYMBALTA) 30 MG capsule Take 60 mg by mouth daily.     . fentaNYL (DURAGESIC) 75 MCG/HR Place 1 patch onto the skin every 3 (three) days. 10 patch 0  . fluticasone (FLONASE) 50 MCG/ACT nasal spray Place 2 sprays into both nostrils daily. 16 g 2  . ipratropium-albuterol (DUONEB) 0.5-2.5 (3) MG/3ML SOLN Take 3 mLs by nebulization every 4 (four) hours as needed. 360 mL 3  . levothyroxine (SYNTHROID) 75 MCG tablet Take 1 tablet (75 mcg total) by mouth daily before breakfast. 30 tablet 3  . loperamide (IMODIUM) 1 MG/5ML solution Take 4 mg by mouth as needed for diarrhea or loose stools.    Marland Kitchen loratadine (CLARITIN) 10 MG tablet Take 10 mg by mouth daily.    Marland Kitchen MELATONIN PO Take 10 mg by mouth at bedtime.     . metFORMIN (GLUCOPHAGE) 500 MG tablet TAKE 1 TABLET (500 MG TOTAL) BY MOUTH 2 (TWO) TIMES DAILY WITH A MEAL. 60 tablet 3  . Misc Natural Products (GLUCOSAMINE CHOND COMPLEX/MSM) TABS Take 2 tablets by mouth daily.    . montelukast (SINGULAIR) 10 MG tablet TAKE 1 TABLET BY MOUTH EVERYDAY AT BEDTIME (Patient taking differently: Take 10 mg by mouth at bedtime. ) 90 tablet 1  . nitroGLYCERIN (NITROSTAT) 0.4 MG SL tablet Place 1 tablet (0.4 mg total) under the tongue every 5 (five) minutes as needed. 30 tablet 0  . Omega-3 Fatty Acids (FISH OIL) 1000 MG CPDR Take 1,000 mg by mouth daily.     Marland Kitchen oxyCODONE 10 MG TABS Take 1-2 tablets (10-20 mg total) by mouth every 4 (four) hours as needed for breakthrough pain. 90 tablet 0  . pantoprazole (PROTONIX) 40 MG tablet Take 1 tablet (40 mg total) by mouth 2 (two) times daily before a meal. 60 tablet 1  . tamsulosin (FLOMAX) 0.4 MG CAPS capsule TAKE 1 CAPSULE (0.4 MG TOTAL) BY MOUTH EVERY MORNING. 30 capsule 3  . temazepam (RESTORIL) 7.5 MG capsule Take 1 capsule (7.5 mg total) by mouth at bedtime as needed for sleep. 30 capsule 3  . Tiotropium Bromide Monohydrate (SPIRIVA RESPIMAT) 1.25 MCG/ACT AERS Inhale 1.25 Act into the lungs 2 (two) times daily.  1  Inhaler 11  . tiZANidine (ZANAFLEX) 4 MG tablet Take 4 mg by mouth daily.     . Turmeric 500 MG TABS Take 500 mg by mouth daily.      Facility-Administered Medications Ordered in Other Encounters  Medication Dose Route Frequency Provider Last Rate Last Dose  . 0.9 %  sodium chloride infusion   Intravenous Continuous Cammie Sickle, MD   Stopped at 04/21/19 6808    PHYSICAL EXAMINATION:  Vitals:   04/22/19 2100 04/22/19 2130  BP: 99/68 112/90  Pulse: 66 68  Resp: 10 10  Temp:    SpO2: 100% 98%   There were no vitals filed for this visit.  Physical Exam  Constitutional: He is oriented to person, place, and time and well-developed, well-nourished, and in no distress.  Alone seen the emergency room.  HENT:  Head: Normocephalic and atraumatic.  Mouth/Throat: Oropharynx is clear and moist. No oropharyngeal exudate.  Eyes: Pupils are equal, round, and reactive to light.  Neck: Normal range of motion. Neck supple.  Cardiovascular: Normal rate and regular rhythm.  Pulmonary/Chest: Effort normal and breath sounds normal. No respiratory distress. He has no wheezes.  Abdominal: Soft. Bowel sounds are normal. He exhibits no distension and no mass. There is no abdominal tenderness. There is no rebound and no guarding.  Musculoskeletal: Normal range of motion.        General: No tenderness or edema.     Comments: Approximately centimeter nodule noted on the right lower extremity shin.  Neurological: He is alert and oriented to person, place, and time.  Skin: Skin is warm.  Psychiatric: Affect normal.     LABORATORY DATA:  I have reviewed the data as listed Lab Results  Component Value Date   WBC 8.6 04/22/2019   HGB 7.7 (L) 04/22/2019   HCT 23.6 (L) 04/22/2019   MCV 92.2 04/22/2019   PLT 150 04/22/2019   Recent Labs    04/08/19 0901 04/16/19 1616  04/19/19 0538 04/20/19 0345 04/22/19 1146  NA 138 140   < > 142 136 138  K 3.9 4.2   < > 3.5 3.6 3.2*  CL 104 105   < >  112* 106 107  CO2 26 22   < > _0 GLUCOSE 159* 123*   < > 108* 129* 91  BUN 14 49*   < > _1 CREATININE 1.12 0.99   < > 0.88 1.02 1.17  CALCIUM 9.3 8.9   < > 7.8* 7.7* 7.9*  GFRNONAA >60 >60   < > >60 >60 60*  GFRAA >60 >60   < > >60 >60 >60  PROT 7.0 6.7  --   --   --  6.2*  ALBUMIN 4.1 3.9  --   --   --  3.6  AST 16 17  --   --   --  19  ALT 10 12  --   --   --  14  ALKPHOS 77 63  --   --   --  74  BILITOT 0.5 0.6  --   --   --  0.9   < > = values in this interval not displayed.    RADIOGRAPHIC STUDIES: I have personally reviewed the radiological images as listed and agreed with the findings in the report. Ct Head Wo Contrast  Result Date: 04/17/2019 CLINICAL DATA:  Altered mental status EXAM: CT HEAD WITHOUT CONTRAST TECHNIQUE: Contiguous axial images were obtained from  the base of the skull through the vertex without intravenous contrast. COMPARISON:  04/21/2018 FINDINGS: Brain: There is no mass, hemorrhage or extra-axial collection. The appearance of the white matter is normal for the patient's age. There is mild generalized volume loss. Vascular: No abnormal hyperdensity of the major intracranial arteries or dural venous sinuses. No intracranial atherosclerosis. Skull: The visualized skull base, calvarium and extracranial soft tissues are normal. Sinuses/Orbits: No fluid levels or advanced mucosal thickening of the visualized paranasal sinuses. No mastoid or middle ear effusion. The orbits are normal. IMPRESSION: No acute intracranial abnormality. Electronically Signed   By: Ulyses Jarred M.D.   On: 04/17/2019 22:45   Ct Chest W Contrast  Result Date: 03/30/2019 CLINICAL DATA:  Restaging metastatic left renal cell cancer status post left nephrectomy with pulmonary and osseous metastases EXAM: CT CHEST, ABDOMEN, AND PELVIS WITH CONTRAST TECHNIQUE: Multidetector CT imaging of the chest, abdomen and pelvis was performed following the standard protocol during bolus  administration of intravenous contrast. CONTRAST:  143m OMNIPAQUE IOHEXOL 300 MG/ML  SOLN COMPARISON:  11/28/2018 FINDINGS: CT CHEST FINDINGS Cardiovascular: Heart is normal in size.  No pericardial effusion. No evidence of thoracic aortic aneurysm. Coronary atherosclerosis of the LAD. Mediastinum/Nodes: Small mediastinal lymph nodes which do not meet pathologic CT size criteria. 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 Lungs/Pleura: Numerous bilateral pulmonary metastases. Overall, mild progression is suspected. Index lesions include: --1.5 x 1.9 mm nodule in the medial left upper lobe (series 3/image 39), unchanged --2.1 x 2.2 cm nodule in the posterior right upper lobe (series 3/image 70), previously 1.7 x 1.8 cm --2.4 x 2.1 cm nodule in the left lower lobe (series 2/image 122), previously 2.1 x 2.2 cm --1.8 x 2.8 cm nodule in the medial right lower lobe (series 3/image 132), previously 1.6 x 2.0 cm Moderate centrilobular and paraseptal emphysematous changes. No focal consolidation. No pleural effusion or pneumothorax. Musculoskeletal: Multifocal lytic osseous metastases, including the T8-10 vertebral bodies, grossly unchanged. Pathologic fracture involving a lytic metastasis in the left scapula (series 9/image 13), incompletely visualized, grossly unchanged. CT ABDOMEN PELVIS FINDINGS Hepatobiliary: Liver is within normal limits. Gallbladder is unremarkable. No intrahepatic or extrahepatic ductal dilatation. Pancreas: Within normal limits. Spleen: Within normal limits. Adrenals/Urinary Tract: 14 mm right adrenal metastasis (series 2/image 61), previously 11 mm. 16 mm left adrenal metastasis (series 2/image 70), previously 12 mm. Status post left nephrectomy. No abnormal soft tissue in the surgical bed. 2.8 cm anterior interpolar right renal cyst (series 2/image 34). No hydronephrosis. Bladder is within normal limits. Stomach/Bowel: Stomach is within normal limits. No evidence of bowel  obstruction. Normal appendix (series 2/image 96). 16 mm pericolonic nodule along the posterior aspect of the ascending colon (series 2/image 81), previously 13 mm, suspicious for enlarging peritoneal metastasis. Vascular/Lymphatic: No evidence of abdominal aortic aneurysm. Atherosclerotic calcifications of the abdominal aorta and branch vessels. No suspicious abdominopelvic lymphadenopathy. Reproductive: Prostate is grossly unremarkable. Other: No abdominopelvic ascites. Musculoskeletal: Multifocal osseous metastases involving the lumbar spine (L2-L4) and bilateral pelvis, including a dominant 4.6 cm lesion in the left iliac bone (series 2/image 93), grossly unchanged. IMPRESSION: Status post left nephrectomy. Mild progression of numerous bilateral pulmonary metastases, with index lesions as above. Bilateral adrenal metastases, mildly increased. Mild progression of peritoneal metastasis along the posterior aspect of the ascending colon. Multifocal osseous metastases, grossly unchanged. Electronically Signed   By: SJulian HyM.D.   On: 03/30/2019 10:14   Ct Abdomen Pelvis W Contrast  Result Date: 03/30/2019 CLINICAL DATA:  Restaging metastatic left renal cell cancer status post left nephrectomy with pulmonary and osseous metastases EXAM: CT CHEST, ABDOMEN, AND PELVIS WITH CONTRAST TECHNIQUE: Multidetector CT imaging of the chest, abdomen and pelvis was performed following the standard protocol during bolus administration of intravenous contrast. CONTRAST:  125m OMNIPAQUE IOHEXOL 300 MG/ML  SOLN COMPARISON:  11/28/2018 FINDINGS: CT CHEST FINDINGS Cardiovascular: Heart is normal in size.  No pericardial effusion. No evidence of thoracic aortic aneurysm. Coronary atherosclerosis of the LAD. Mediastinum/Nodes: Small mediastinal lymph nodes which do not meet pathologic CT size criteria. 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 Lungs/Pleura: Numerous bilateral pulmonary metastases.  Overall, mild progression is suspected. Index lesions include: --1.5 x 1.9 mm nodule in the medial left upper lobe (series 3/image 39), unchanged --2.1 x 2.2 cm nodule in the posterior right upper lobe (series 3/image 70), previously 1.7 x 1.8 cm --2.4 x 2.1 cm nodule in the left lower lobe (series 2/image 122), previously 2.1 x 2.2 cm --1.8 x 2.8 cm nodule in the medial right lower lobe (series 3/image 132), previously 1.6 x 2.0 cm Moderate centrilobular and paraseptal emphysematous changes. No focal consolidation. No pleural effusion or pneumothorax. Musculoskeletal: Multifocal lytic osseous metastases, including the T8-10 vertebral bodies, grossly unchanged. Pathologic fracture involving a lytic metastasis in the left scapula (series 9/image 13), incompletely visualized, grossly unchanged. CT ABDOMEN PELVIS FINDINGS Hepatobiliary: Liver is within normal limits. Gallbladder is unremarkable. No intrahepatic or extrahepatic ductal dilatation. Pancreas: Within normal limits. Spleen: Within normal limits. Adrenals/Urinary Tract: 14 mm right adrenal metastasis (series 2/image 61), previously 11 mm. 16 mm left adrenal metastasis (series 2/image 70), previously 12 mm. Status post left nephrectomy. No abnormal soft tissue in the surgical bed. 2.8 cm anterior interpolar right renal cyst (series 2/image 34). No hydronephrosis. Bladder is within normal limits. Stomach/Bowel: Stomach is within normal limits. No evidence of bowel obstruction. Normal appendix (series 2/image 96). 16 mm pericolonic nodule along the posterior aspect of the ascending colon (series 2/image 81), previously 13 mm, suspicious for enlarging peritoneal metastasis. Vascular/Lymphatic: No evidence of abdominal aortic aneurysm. Atherosclerotic calcifications of the abdominal aorta and branch vessels. No suspicious abdominopelvic lymphadenopathy. Reproductive: Prostate is grossly unremarkable. Other: No abdominopelvic ascites. Musculoskeletal: Multifocal  osseous metastases involving the lumbar spine (L2-L4) and bilateral pelvis, including a dominant 4.6 cm lesion in the left iliac bone (series 2/image 93), grossly unchanged. IMPRESSION: Status post left nephrectomy. Mild progression of numerous bilateral pulmonary metastases, with index lesions as above. Bilateral adrenal metastases, mildly increased. Mild progression of peritoneal metastasis along the posterior aspect of the ascending colon. Multifocal osseous metastases, grossly unchanged. Electronically Signed   By: SJulian HyM.D.   On: 03/30/2019 10:14   Mr Shoulder Left W Wo Contrast  Result Date: 04/15/2019 CLINICAL DATA:  Left shoulder pain. Fell 6 weeks ago and fractured glenoid. Persistent pain and limited range of motion. History of renal cell cancer and lung cancer. EXAM: MRI OF THE LEFT SHOULDER WITHOUT AND WITH CONTRAST TECHNIQUE: Multiplanar, multisequence MR imaging of the left shoulder was performed before and after the administration of intravenous contrast. CONTRAST:  8 cc Gadavist COMPARISON:  CT shoulder 10/20/2018 and CT chest, abdomen and pelvis 03/30/2019 FINDINGS: Rotator cuff: Moderate rotator cuff tendinopathy/tendinosis but no full-thickness retracted rotator cuff tear. Muscles: Marked edema like signal abnormality in the supraspinatus and infraspinatus muscles could suggest a muscle strain, myositis or radiation change. No discrete muscle mass. Biceps long head:  Intact. Acromioclavicular Joint: Chronic AC joint degenerative changes and mild  subluxation of the joint. Edema like signal abnormality in the distal clavicle and acromion likely on going stress related degenerative changes. There is also a small joint effusion and mild pannus formation. Type 2 acromion. No significant lateral downsloping or undersurface spurring. Glenohumeral Joint: Small joint effusion and moderate synovitis. Moderate degenerative changes. Labrum:  The superior labrum is torn and partially detached.  Bones: Large enhancing metastatic lesion involving the glenoid with a chronic ununited pathologic fracture. This measures approximately 3.2 x 2.8 cm. No other obvious metastatic bone lesions. Other: Moderate subacromial/subdeltoid bursitis. IMPRESSION: 1. 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 2. Rotator cuff tendinopathy/tendinosis but no full-thickness rotator cuff tear. 3. Torn and detached superior labrum. 4. Chronic AC joint degenerative changes and glenohumeral joint degenerative changes. Electronically Signed   By: Marijo Sanes M.D.   On: 04/15/2019 13:47   Dg Chest Port 1 View  Result Date: 04/16/2019 CLINICAL DATA:  History of lung cancer. EXAM: PORTABLE CHEST 1 VIEW COMPARISON:  Chest CT 03/30/2019 FINDINGS: The cardiac silhouette, mediastinal and hilar contours are within normal limits and stable. Multiple vague scattered pulmonary lesions consistent with known pulmonary metastatic disease. No acute overlying pulmonary findings or pleural effusion. Left glenoid bone lesion again noted. IMPRESSION: Pulmonary and osseous metastatic disease but no acute overlying pulmonary process. Electronically Signed   By: Marijo Sanes M.D.   On: 04/16/2019 16:32   Dg Bone Survey Met  Result Date: 04/01/2019 CLINICAL DATA:  78 year old male with known progression of metastatic disease on recent CT imaging EXAM: METASTATIC BONE SURVEY COMPARISON:  03/30/2019, prior bone scan 12/01/2018 FINDINGS: Skull: No lytic lesions. Cervical spine: Advanced bilateral facet hypertrophy with multilevel disc narrowing worst spanning C5-C7. No lytic lesions of the cervical spine. Chest: No displaced fracture of the ribs.  No lytic lesions identified. Unremarkable cardiomediastinal silhouette with chronic lung changes. Pulmonary metastases are better characterized on recent chest CT imaging. Left upper extremity: Mixed lytic and sclerotic lesion of the coracoid and glenoid. Unremarkable left humerus  with no lytic lesions or fracture. No fracture or lytic lesion of the forearm Right upper extremity: No acute fracture of the scapula. No lytic lesions. Glenohumeral joint congruent. Degenerative changes of the [facility] joint. No fracture or lytic lesions of the humerus. No fracture or lytic lesion of the forearm Thoracic spine: Multilevel degenerative changes of the thoracic spine. The lucent lesions of the T8, T9, T10 levels with sclerotic margins better characterized on recent CT imaging. No progression of height loss. Lumbar spine: Degenerative changes throughout. Lucent lesions of L2, L3, L4, as well as the sacral base are better characterized on prior CT imaging. No progression of height loss. Pelvis: Lytic and sclerotic lesion of the left iliac crest, better characterized on prior CT. Lucent lesion adjacent to the left sacroiliac joint within the iliac bone again demonstrated, better seen on prior CT. Lucent lesion within the right iliac bone adjacent to the sacroiliac joint, better seen on prior CT. Lesions involving the sacral base better seen on prior CT. Right lower extremity: No acute fracture.  No lytic lesions. Left lower extremity: No acute fracture.  No lytic lesions. Surgical changes of prior open reduction internal fixation of left tibial and fibular fractures, incompletely healed/remodeled. IMPRESSION: Multiple predominately lytic lesions/metastatic lesions of the axial skeleton/pelvis, all of which are better characterized on recent CT imaging. These include lesions of T8, T9, T10, L2, L3, L4, sacral base, and the bilateral iliac bones. Mixed sclerotic and lytic lesion of the left  scapula. Surgical changes of the left lower extremity. Electronically Signed   By: Corrie Mckusick D.O.   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  Most recent CT scan/ May 2020 showed slightly increasing lung metastases increasing by few millimeters; otherwise overall stable bone mets.  Patient currently status post cycle #5 of Ipi+Nivo appx 2 weeks ago.  Further therapy will depend upon patient's recovery/performance status etc.  #Acute mental status changes/hypotension-unclear etiology.  Patient has a history of labile blood pressures for which he has been Mestinon in the past.  However this is been discontinued because of poor tolerance [? abdominal pain].  Currently back to baseline.  If mentation does not improve recommend repeat imaging.  Clinically no concern for stroke.  Clinically do not think patient mental status changes related to his immunotherapy.  # Left shoulder pain/ left leg pain-secondary metastasis.  Currently improved.  Continue off narcotics given the mental status changes.  #Recent upper GI bleed-status post endoscopy; monitor hemoglobin closely.  Hemoglobin today 7.7 close to baseline at discharge.  #Overall prognosis is poor from the standpoint of renal cell carcinoma.  Patient status post multiple lines of therapies-most recently immunotherapy.  Had a long discussion with patient's wife and reviewed in general his overall poor prognosis.  For now would recommend current supportive care.  Agree with DNR/DNI.  Thank you Dr.Sudini for allowing me to participate in the care of your pleasant patient. Please do not hesitate to contact me with questions or concerns in the interim.   All questions were answered. The patient knows to call the clinic with any problems, questions or concerns.    Cammie Sickle, MD 04/22/2019 9:35 PM

## 2019-04-22 NOTE — ED Triage Notes (Signed)
First RN Note: Pt presents to ED via wheelchair from [facility] This RN received phone call from [facility] MD regarding patient, [place] MD reports BP at [facility] 170/40, recently D/C from hospital with upper GI bleed, reports at [facility] patient noted to be pale, having halllucinations and some confusion. Per [facility] MD patient also reporting black stools. Upon arrival to ED pt is alert and oriented to person, place, situation and month, disoriented to year at this time.

## 2019-04-22 NOTE — ED Triage Notes (Signed)
Pt from [facility] with c/o AMS and black tarry stool. Per spouse states pt confusion xseveral days but worse last night. PT recently discharged from hospital due to rectal bleeding from polyps. Recently had polyps removed. PT A&Ox4. BP 88/50 in traige.

## 2019-04-22 NOTE — ED Notes (Signed)
ED TO INPATIENT HANDOFF REPORT  ED Nurse Name and Phone #: Tylee Yum [phone]  S Name/Age/Gender [place] 78 y.o. male Room/Bed: ED10A/ED10A  Code Status   Code Status: DNR  Home/SNF/Other Home Patient oriented to: self, place and time Is this baseline? No   Triage Complete: Triage complete  Chief Complaint low bp confusion  Triage Note First RN Note: Pt presents to ED via wheelchair from [facility]. This RN received phone call from [facility] MD regarding patient, [place] MD reports BP at [facility] 170/40, recently D/C from hospital with upper GI bleed, reports at [facility] patient noted to be pale, having halllucinations and some confusion. Per [facility] MD patient also reporting black stools. Upon arrival to ED pt is alert and oriented to person, place, situation and month, disoriented to year at this time.   Pt from [facility] with c/o AMS and black tarry stool. Per spouse states pt confusion xseveral days but worse last night. PT recently discharged from hospital due to rectal bleeding from polyps. Recently had polyps removed. PT A&Ox4. BP 88/50 in traige.    Allergies Allergies  Allergen Reactions  . Lisinopril Swelling  . Other Itching    Nitroglycerin Patch.    Level of Care/Admitting Diagnosis ED Disposition    ED Disposition Condition [facility] Area: [place] [[id number]]  Level of Care: Med-Surg [16]  Covid Evaluation: N/A  Diagnosis: Hypotension [268341]  Admitting Physician: Hillary Bow [[id number]]  Attending Physician: Hillary Bow [[id number]]  PT Class (Do Not Modify): Observation [104]  PT Acc Code (Do Not Modify): Observation [10022]       B Medical/Surgery History Past Medical History:  Diagnosis Date  . CAD (coronary artery disease)   . Cancer ([place])    left arm  . COPD (chronic obstructive pulmonary disease) ([place])   . Hypertension   . Kidney stone   . Lung cancer ([place])   . Melanoma ([place]) 01/24/2016   right shoulder  . Thyroid nodule 02/02/2016   left  BENIGN THYROID NODULE by FNA   Past Surgical History:  Procedure Laterality Date  . arm surgery Left    arm  . cardiac stents  2011   Angioplasty / Stenting Femoral-X2  . COLONOSCOPY  04/01/12  . CORONARY ANGIOPLASTY    . ESOPHAGOGASTRODUODENOSCOPY N/A 04/21/2019   Procedure: ESOPHAGOGASTRODUODENOSCOPY (EGD);  Surgeon: Lin Landsman, MD;  Location: [facility] ENDOSCOPY;  Service: Gastroenterology;  Laterality: N/A;  . EXCISION MELANOMA WITH SENTINEL LYMPH NODE BIOPSY Right 01/24/2016   Procedure: EXCISION MELANOMA Right Shoulder;  Surgeon: Robert Bellow, MD;  Location: [facility] ORS;  Service: General;  Laterality: Right;  . LAPAROSCOPIC NEPHRECTOMY, HAND ASSISTED Left 04/24/2016   Procedure: HAND ASSISTED LAPAROSCOPIC NEPHRECTOMY;  Surgeon: Hollice Espy, MD;  Location: [facility] ORS;  Service: Urology;  Laterality: Left;  . TIBIA IM NAIL INSERTION Left 02/11/2019   Procedure: INTRAMEDULLARY (IM) NAIL TIBIAL;  Surgeon: Lovell Sheehan, MD;  Location: [facility] ORS;  Service: Orthopedics;  Laterality: Left;  Marland Kitchen VIDEO ASSISTED THORACOSCOPY (VATS)/THOROCOTOMY Left 03/08/2016   Procedure: VIDEO ASSISTED THORACOSCOPY (VATS) with lung biopsy - Left ;  Surgeon: Robert Bellow, MD;  Location: [facility] ORS;  Service: General;  Laterality: Left;     A IV Location/Drains/Wounds Patient Lines/Drains/Airways Status   Active Line/Drains/Airways    Name:   Placement date:   Placement time:   Site:   Days:   Peripheral IV 04/22/19 Left Antecubital   04/22/19    1148  Antecubital   less than 1   Incision (Closed) 02/11/19 Knee Left   02/11/19    1232     70          Intake/Output Last 24 hours  Intake/Output Summary (Last 24 hours) at 04/22/2019 1613 Last data filed at 04/22/2019 1250 Gross per 24 hour  Intake 1000 ml  Output -  Net 1000 ml    Labs/Imaging Results for orders placed or performed during the hospital encounter of 04/22/19 (from the past 48 hour(s))  CBC with Differential     Status: Abnormal    Collection Time: 04/22/19 11:46 AM  Result Value Ref Range   WBC 8.6 4.0 - 10.5 K/uL   RBC 2.56 (L) 4.22 - 5.81 MIL/uL   Hemoglobin 7.7 (L) 13.0 - 17.0 g/dL   HCT 23.6 (L) 39.0 - 52.0 %   MCV 92.2 80.0 - 100.0 fL   MCH 30.1 26.0 - 34.0 pg   MCHC 32.6 30.0 - 36.0 g/dL   RDW 16.4 (H) 11.5 - 15.5 %   Platelets 150 150 - 400 K/uL   nRBC 0.0 0.0 - 0.2 %   Neutrophils Relative % 79 %   Neutro Abs 6.7 1.7 - 7.7 K/uL   Lymphocytes Relative 7 %   Lymphs Abs 0.6 (L) 0.7 - 4.0 K/uL   Monocytes Relative 10 %   Monocytes Absolute 0.9 0.1 - 1.0 K/uL   Eosinophils Relative 3 %   Eosinophils Absolute 0.3 0.0 - 0.5 K/uL   Basophils Relative 1 %   Basophils Absolute 0.0 0.0 - 0.1 K/uL   Immature Granulocytes 0 %   Abs Immature Granulocytes 0.03 0.00 - 0.07 K/uL    Comment: Performed at [facility], [address] [place], [place] [address]  Comprehensive metabolic panel     Status: Abnormal   Collection Time: 04/22/19 11:46 AM  Result Value Ref Range   Sodium 138 135 - 145 mmol/L   Potassium 3.2 (L) 3.5 - 5.1 mmol/L   Chloride 107 98 - 111 mmol/L   CO2 24 22 - 32 mmol/L   Glucose, Bld 91 70 - 99 mg/dL   BUN 11 8 - 23 mg/dL   Creatinine, Ser 1.17 0.61 - 1.24 mg/dL   Calcium 7.9 (L) 8.9 - 10.3 mg/dL   Total Protein 6.2 (L) 6.5 - 8.1 g/dL   Albumin 3.6 3.5 - 5.0 g/dL   AST 19 15 - 41 U/L   ALT 14 0 - 44 U/L   Alkaline Phosphatase 74 38 - 126 U/L   Total Bilirubin 0.9 0.3 - 1.2 mg/dL   GFR calc non Af Amer 60 (L) >60 mL/min   GFR calc Af Amer >60 >60 mL/min   Anion gap 7 5 - 15    Comment: Performed at [facility], [address] [place], [place] [address]  Troponin I - ONCE - STAT     Status: None   Collection Time: 04/22/19 11:46 AM  Result Value Ref Range   Troponin I <0.03 <0.03 ng/mL    Comment: Performed at [facility], [address] [place], [place] [address]  Protime-INR     Status: None   Collection Time: 04/22/19 11:46 AM  Result Value Ref  Range   Prothrombin Time 14.5 11.4 - 15.2 seconds   INR 1.1 0.8 - 1.2    Comment: (NOTE) INR goal varies based on device and disease states. Performed at [facility], [address] [place],  [place] [address]   Type and screen     Status: None   Collection Time: 04/22/19 11:48 AM  Result Value Ref Range   ABO/RH(D) O POS    Antibody Screen NEG    Sample Expiration      04/25/2019,2359 Performed at [facility], [address] [place], [place] [address]   Urinalysis, Complete w Microscopic     Status: Abnormal   Collection Time: 04/22/19  2:13 PM  Result Value Ref Range   Color, Urine YELLOW (A) YELLOW   APPearance CLEAR (A) CLEAR   Specific Gravity, Urine 1.013 1.005 - 1.030   pH 5.0 5.0 - 8.0   Glucose, UA NEGATIVE NEGATIVE mg/dL   Hgb urine dipstick NEGATIVE NEGATIVE   Bilirubin Urine NEGATIVE NEGATIVE   Ketones, ur NEGATIVE NEGATIVE mg/dL   Protein, ur NEGATIVE NEGATIVE mg/dL   Nitrite NEGATIVE NEGATIVE   Leukocytes,Ua NEGATIVE NEGATIVE   RBC / HPF 0-5 0 - 5 RBC/hpf   WBC, UA 0-5 0 - 5 WBC/hpf   Bacteria, UA NONE SEEN NONE SEEN   Squamous Epithelial / LPF NONE SEEN 0 - 5   Mucus PRESENT     Comment: Performed at [facility], [address], [place], [place] [address]  SARS Coronavirus 2 (CEPHEID - Performed in [place] hospital lab), Hosp Order     Status: None   Collection Time: 04/22/19  2:13 PM  Result Value Ref Range   SARS Coronavirus 2 NEGATIVE NEGATIVE    Comment: (NOTE) If result is NEGATIVE SARS-CoV-2 target nucleic acids are NOT DETECTED. The SARS-CoV-2 RNA is generally detectable in upper and lower  respiratory specimens during the acute phase of infection. The lowest  concentration of SARS-CoV-2 viral copies this assay can detect is 250  copies / mL. A negative result does not preclude SARS-CoV-2 infection  and should not be used as the sole basis for treatment or other  patient management decisions.  A negative  result may occur with  improper specimen collection / handling, submission of specimen other  than nasopharyngeal swab, presence of viral mutation(s) within the  areas targeted by this assay, and inadequate number of viral copies  (<250 copies / mL). A negative result must be combined with clinical  observations, patient history, and epidemiological information. If result is POSITIVE SARS-CoV-2 target nucleic acids are DETECTED. The SARS-CoV-2 RNA is generally detectable in upper and lower  respiratory specimens dur ing the acute phase of infection.  Positive  results are indicative of active infection with SARS-CoV-2.  Clinical  correlation with patient history and other diagnostic information is  necessary to determine patient infection status.  Positive results do  not rule out bacterial infection or co-infection with other viruses. If result is PRESUMPTIVE POSTIVE SARS-CoV-2 nucleic acids MAY BE PRESENT.   A presumptive positive result was obtained on the submitted specimen  and confirmed on repeat testing.  While 2019 novel coronavirus  (SARS-CoV-2) nucleic acids may be present in the submitted sample  additional confirmatory testing may be necessary for epidemiological  and / or clinical management purposes  to differentiate between  SARS-CoV-2 and other Sarbecovirus currently known to infect humans.  If clinically indicated additional testing with an alternate test  methodology [phone]) is advised. The SARS-CoV-2 RNA is generally  detectable in upper and lower respiratory sp ecimens during the acute  phase of infection. The expected result is Negative. Fact Sheet for Patients:  StrictlyIdeas.no Fact Sheet for Healthcare Providers: BankingDealers.co.za This test  is not yet approved or cleared by the [place] and has been authorized for detection and/or diagnosis of SARS-CoV-2 by FDA under an Emergency Use Authorization  (EUA).  This EUA will remain in effect (meaning this test can be used) for the duration of the COVID-19 declaration under Section 564(b)(1) of the Act, 21 U.S.C. section 360bbb-3(b)(1), unless the authorization is terminated or revoked sooner. Performed at [facility], [address] [place], [place] [address]    No results found.  Pending Labs Unresulted Labs (From admission, onward)    Start     Ordered   04/23/19 6045  Basic metabolic panel  Tomorrow morning,   STAT     04/22/19 1545   04/23/19 0500  CBC  Tomorrow morning,   STAT     04/22/19 1545   04/22/19 2000  Hemoglobin  Once-Timed,   STAT     04/22/19 1545          Vitals/Pain Today's Vitals   04/22/19 1330 04/22/19 1414 04/22/19 1500 04/22/19 1530  BP: (!) 92/54 91/66 95/82  (!) 89/56  Pulse: 68 74 68 60  Resp: 15 16 16 12   Temp:      TempSrc:      SpO2: 100% 93% 98% 94%  PainSc:        Isolation Precautions No active isolations  Medications Medications  acetaminophen (TYLENOL) tablet 650 mg (has no administration in time range)    Or  acetaminophen (TYLENOL) suppository 650 mg (has no administration in time range)  polyethylene glycol (MIRALAX / GLYCOLAX) packet 17 g (has no administration in time range)  albuterol (PROVENTIL) (2.5 MG/3ML) 0.083% nebulizer solution 2.5 mg (has no administration in time range)  ondansetron (ZOFRAN) tablet 4 mg (has no administration in time range)    Or  ondansetron (ZOFRAN) injection 4 mg (has no administration in time range)  docusate sodium (COLACE) capsule 100 mg (has no administration in time range)  potassium chloride SA (K-DUR) CR tablet 40 mEq (has no administration in time range)  lactated ringers infusion (has no administration in time range)  0.9 %  sodium chloride infusion (0 mLs Intravenous Stopped 04/22/19 1250)    Mobility walks High fall risk   Focused Assessments Neuro Assessment Handoff:  Swallow screen pass? not attempted Cardiac  Rhythm: Normal sinus rhythm NIH Stroke Scale ( + Modified Stroke Scale Criteria)  Interval: Initial Level of Consciousness (1a.)   : Alert, keenly responsive LOC Questions (1b. )   +: Answers both questions correctly LOC Commands (1c. )   + : Performs both tasks correctly Best Gaze (2. )  +: Normal Visual (3. )  +: No visual loss Facial Palsy (4. )    : Normal symmetrical movements Motor Arm, Left (5a. )   +: No drift Motor Arm, Right (5b. )   +: No drift Motor Leg, Left (6a. )   +: No drift Motor Leg, Right (6b. )   +: No drift Limb Ataxia (7. ): Absent Sensory (8. )   +: Normal, no sensory loss Best Language (9. )   +: No aphasia Dysarthria (10. ): Normal Extinction/Inattention (11.)   +: No Abnormality Modified SS Total  +: 0 Complete NIHSS TOTAL: 0     Neuro Assessment:   Neuro Checks:   Initial (04/22/19 1220)  Last Documented NIHSS Modified Score: 0 (04/22/19 1220) Has TPA been given? No If patient is a Neuro Trauma and patient is going to OR before floor call  report to [place] nurse: [phone] or [phone]     R Recommendations: See Admitting Provider Note  Report given to:   Additional Notes:

## 2019-04-22 NOTE — ED Provider Notes (Signed)
[facility] Emergency Department Provider Note       Time seen: ----------------------------------------- 11:37 AM on 04/22/2019 -----------------------------------------   I have reviewed the triage vital signs and the nursing notes.  HISTORY   Chief Complaint Altered Mental Status    HPI Glen Davis is a 78 y.o. male with a history of coronary artery disease, COPD, hypertension, lung cancer, thyroid nodule who presents to the ED for altered mental status with black tarry stools.  According to his spouse he has had confusion for several days that got worse last night.  He was recently discharged from the hospital due to rectal bleeding.  He recently had the polyps removed.  He presented from [facility] for same.  He denies any pain at this time.  Past Medical History:  Diagnosis Date  . CAD (coronary artery disease)   . Cancer ([place])    left arm  . COPD (chronic obstructive pulmonary disease) ([place])   . Hypertension   . Kidney stone   . Lung cancer ([place])   . Melanoma ([place]) 01/24/2016   right shoulder  . Thyroid nodule 02/02/2016   left BENIGN THYROID NODULE by FNA    Patient Active Problem List   Diagnosis Date Noted  . Hematemesis 04/16/2019  . Unsp fracture of upper end of unsp tibia, init for clos fx 02/10/2019  . Cancer, metastatic to bone ([place]) 05/18/2018  . Orthostatic hypotension 03/24/2018  . Acute sinusitis 02/06/2017  . Cough 10/09/2016  . SOB (shortness of breath) 09/13/2016  . Syncope 08/17/2016  . Situational anxiety 06/05/2016  . 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 Coronary artery disease involving native coronary artery of native heart without angina pectoris 03/16/2016  . Essential (primary) hypertension 03/16/2016  . Hypertriglyceridemia 03/16/2016  . Left thyroid nodule 02/03/2016  . Pulmonary nodules/lesions, multiple 02/03/2016  . Counseling  regarding goals of care 01/30/2016  . Vaccine counseling 01/30/2016  . 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 Melanoma of skin ([place]) 01/12/2016  . Breathlessness on exertion 04/25/2015    Past Surgical History:  Procedure Laterality Date  . arm surgery Left    arm  . cardiac stents  2011   Angioplasty / Stenting Femoral-X2  . COLONOSCOPY  04/01/12  . CORONARY ANGIOPLASTY    . ESOPHAGOGASTRODUODENOSCOPY N/A 04/21/2019   Procedure: ESOPHAGOGASTRODUODENOSCOPY (EGD);  Surgeon: Lin Landsman, MD;  Location: [facility] ENDOSCOPY;  Service: Gastroenterology;  Laterality: N/A;  . EXCISION MELANOMA WITH SENTINEL LYMPH NODE BIOPSY Right 01/24/2016   Procedure: EXCISION MELANOMA Right Shoulder;  Surgeon: Robert Bellow, MD;  Location: [facility] ORS;  Service: General;  Laterality: Right;  . LAPAROSCOPIC NEPHRECTOMY, HAND ASSISTED Left 04/24/2016   Procedure: HAND ASSISTED LAPAROSCOPIC NEPHRECTOMY;  Surgeon: Hollice Espy, MD;  Location: [facility] ORS;  Service: Urology;  Laterality: Left;  . TIBIA IM NAIL INSERTION Left 02/11/2019   Procedure: INTRAMEDULLARY (IM) NAIL TIBIAL;  Surgeon: Lovell Sheehan, MD;  Location: [facility] ORS;  Service: Orthopedics;  Laterality: Left;  Marland Kitchen VIDEO ASSISTED THORACOSCOPY (VATS)/THOROCOTOMY Left 03/08/2016   Procedure: VIDEO ASSISTED THORACOSCOPY (VATS) with lung biopsy - Left ;  Surgeon: Robert Bellow, MD;  Location: [facility] ORS;  Service: General;  Laterality: Left;    Allergies Lisinopril and Other  Social History Social History   Tobacco Use  . Smoking status: Former Smoker    Packs/day: 2.00    Years: 30.00  Pack years: 60.00    Types: Cigarettes    Start date: 01/18/1956    Last attempt to quit: 01/17/1969    Years since quitting: 50.2  . Smokeless tobacco: Never Used  Substance Use Topics  . Alcohol use: Not Currently    Alcohol/week: 0.0 standard drinks    Comment: BEER OCC  . Drug use: No   Review of Systems Constitutional: Negative for  fever. Cardiovascular: Negative for chest pain. Respiratory: Negative for shortness of breath. Gastrointestinal: Negative for abdominal pain, vomiting and diarrhea.  Positive for black stool yesterday Musculoskeletal: Negative for back pain. Skin: Negative for rash. Neurological: Negative for headaches, focal weakness or numbness.  All systems negative/normal/unremarkable except as stated in the HPI  ____________________________________________   PHYSICAL EXAM:  VITAL SIGNS: ED Triage Vitals [04/22/19 1115]  Enc Vitals Group     BP (!) 88/50     Pulse Rate 91     Resp 16     Temp 98.4 F (36.9 C)     Temp Source Oral     SpO2 96 %     Weight      Height      Head Circumference      Peak Flow      Pain Score 0     Pain Loc      Pain Edu?      Excl. in [place]?     Constitutional: Alert and oriented. Well appearing and in no distress. Eyes: Conjunctivae are normal. Normal extraocular movements. ENT      Head: Normocephalic and atraumatic.      Nose: No congestion/rhinnorhea.      Mouth/Throat: Mucous membranes are moist.      Neck: No stridor. Cardiovascular: Normal rate, regular rhythm. No murmurs, rubs, or gallops. Respiratory: Normal respiratory effort without tachypnea nor retractions. Breath sounds are clear and equal bilaterally. No wheezes/rales/rhonchi. Gastrointestinal: Soft and nontender. Normal bowel sounds Musculoskeletal: Nontender with normal range of motion in extremities. No lower extremity tenderness nor edema. Neurologic:  Normal speech and language. No gross focal neurologic deficits are appreciated.  Skin:  Skin is warm, dry and intact. No rash noted. Psychiatric: Mood and affect are normal. Speech and behavior are normal.  ____________________________________________  EKG: Interpreted by me.  Sinus rhythm with a rate of 66 bpm, leftward axis, low voltage, normal QT.  ____________________________________________  ED COURSE:  As part of my medical  decision making, I reviewed the following data within the [place] History obtained from family if available, nursing notes, old chart and ekg, as well as notes from prior ED visits. Patient presented for possible gastrointestinal bleeding and was found to be hypotensive on arrival here, we will assess with labs and imaging as indicated at this time.   Procedures  DELORIS MITTAG was evaluated in Emergency Department on 04/22/2019 for the symptoms described in the history of present illness. He was evaluated in the context of the global COVID-19 pandemic, which necessitated consideration that the patient might be at risk for infection with the SARS-CoV-2 virus that causes COVID-19. Institutional protocols and algorithms that pertain to the evaluation of patients at risk for COVID-19 are in a state of rapid change based on information released by regulatory bodies including the CDC and federal and state organizations. These policies and algorithms were followed during the patient's care in the ED.  ____________________________________________   LABS (pertinent positives/negatives)  Labs Reviewed  CBC WITH DIFFERENTIAL/PLATELET - Abnormal; Notable for the following  components:      Result Value   RBC 2.56 (*)    Hemoglobin 7.7 (*)    HCT 23.6 (*)    RDW 16.4 (*)    Lymphs Abs 0.6 (*)    All other components within normal limits  COMPREHENSIVE METABOLIC PANEL - Abnormal; Notable for the following components:   Potassium 3.2 (*)    Calcium 7.9 (*)    Total Protein 6.2 (*)    GFR calc non Af Amer 60 (*)    All other components within normal limits  SARS CORONAVIRUS 2 (HOSPITAL ORDER, [facility])  TROPONIN I  PROTIME-INR  URINALYSIS, COMPLETE (UACMP) WITH MICROSCOPIC  TYPE AND SCREEN   ___________________________________________   DIFFERENTIAL DIAGNOSIS   Anemia, gastrointestinal bleeding, dehydration, sepsis, electrolyte abnormality  FINAL  ASSESSMENT AND PLAN  Altered mental status, black stools, hypotension   Plan: The patient had presented for altered mental status with black tarry stools and hypotension. Patient's labs did reveal a slight decrease in his hemoglobin compared to prior.  He did not require any imaging during his ER stay.  He has not had any black stools today but has remained hypotensive while in the ER of uncertain etiology.  We have given IV fluids.  I will discuss with the hospitalist for admission.   Laurence Aly, MD    Note: This note was generated in part or whole with voice recognition software. Voice recognition is usually quite accurate but there are transcription errors that can and very often do occur. I apologize for any typographical errors that were not detected and corrected.     Earleen Newport, MD 04/22/19 [id number]

## 2019-04-22 NOTE — H&P (Addendum)
[facility] [place] NAME: Glen Davis    MR#:  [id number]  DATE OF BIRTH:  10/16/1941  DATE OF ADMISSION:  04/22/2019  PRIMARY CARE PHYSICIAN: Dion Body, MD   REQUESTING/REFERRING PHYSICIAN: Dr. Jimmye Norman  CHIEF COMPLAINT:   Chief Complaint  Patient presents with  . Altered Mental Status    HISTORY OF PRESENT ILLNESS:  Glen Davis  is a 78 y.o. male with a known history of hypertension, metastatic renal cell carcinoma, recent upper GI bleed with gastric polypectomy, anemia who was discharged yesterday from the hospital presents again from [facility]ral clinic due to confusion, being pale and melena.  Patient at this time feels weak and sleeps easily but is alert and oriented.  Able to give history.  Blood pressure initially was systolic 10G and after 1 L bolus has come up into the 90s.  Hemoglobin is stable from the time of discharge. No abdominal pain or vomiting.  Poor appetite.  Patient had some confusion during the hospital stay that was improved by time of discharge.  Thought to be due to being in the hospital.  A CT scan of the head during recent admission 3 days back showed nothing acute.   PAST MEDICAL HISTORY:   Past Medical History:  Diagnosis Date  . CAD (coronary artery disease)   . Cancer ([place])    left arm  . COPD (chronic obstructive pulmonary disease) ([place])   . Hypertension   . Kidney stone   . Lung cancer ([place])   . Melanoma ([place]) 01/24/2016   right shoulder  . Thyroid nodule 02/02/2016   left BENIGN THYROID NODULE by FNA    PAST SURGICAL HISTORY:   Past Surgical History:  Procedure Laterality Date  . arm surgery Left    arm  . cardiac stents  2011   Angioplasty / Stenting Femoral-X2  . COLONOSCOPY  04/01/12  . CORONARY ANGIOPLASTY    . ESOPHAGOGASTRODUODENOSCOPY N/A 04/21/2019   Procedure: ESOPHAGOGASTRODUODENOSCOPY (EGD);  Surgeon: Lin Landsman, MD;  Location[facility] ENDOSCOPY;  Service:  Gastroenterology;  Laterality: N/A;  . EXCISION MELANOMA WITH SENTINEL LYMPH NODE BIOPSY Right 01/24/2016   Procedure: EXCISION MELANOMA Right Shoulder;  Surgeon: Robert Bellow, MD;  Location: [facility] ORS;  Service: General;  Laterality: Right;  . LAPAROSCOPIC NEPHRECTOMY, HAND ASSISTED Left 04/24/2016   Procedure: HAND ASSISTED LAPAROSCOPIC NEPHRECTOMY;  Surgeon: Hollice Espy, MD;  Location: [facility] ORS;  Service: Urology;  Laterality: Left;  . TIBIA IM NAIL INSERTION Left 02/11/2019   Procedure: INTRAMEDULLARY (IM) NAIL TIBIAL;  Surgeon: Lovell Sheehan, MD;  Location: [facility] ORS;  Service: Orthopedics;  Laterality: Left;  Marland Kitchen VIDEO ASSISTED THORACOSCOPY (VATS)/THOROCOTOMY Left 03/08/2016   Procedure: VIDEO ASSISTED THORACOSCOPY (VATS) with lung biopsy - Left ;  Surgeon: Robert Bellow, MD;  Location: [facility] ORS;  Service: General;  Laterality: Left;    SOCIAL HISTORY:   Social History   Tobacco Use  . Smoking status: Former Smoker    Packs/day: 2.00    Years: 30.00    Pack years: 60.00    Types: Cigarettes    Start date: 01/18/1956    Last attempt to quit: 01/17/1969    Years since quitting: 50.2  . Smokeless tobacco: Never Used  Substance Use Topics  . Alcohol use: Not Currently    Alcohol/week: 0.0 standard drinks    Comment: BEER OCC    FAMILY HISTORY:   Family History  Problem Relation Age of Onset  . Hodgkin's  lymphoma Daughter 5  . Heart attack Father   . Kidney cancer Neg Hx   . Kidney disease Neg Hx   . Prostate cancer Neg Hx     DRUG ALLERGIES:   Allergies  Allergen Reactions  . Lisinopril Swelling  . Other Itching    Nitroglycerin Patch.    REVIEW OF SYSTEMS:   Review of Systems  Constitutional: Positive for malaise/fatigue. Negative for chills, fever and weight loss.  HENT: Negative for hearing loss and nosebleeds.   Eyes: Negative for blurred vision, double vision and pain.  Respiratory: Negative for cough, hemoptysis, sputum production, shortness of breath  and wheezing.   Cardiovascular: Negative for chest pain, palpitations, orthopnea and leg swelling.  Gastrointestinal: Positive for melena. Negative for abdominal pain, constipation, diarrhea, nausea and vomiting.  Genitourinary: Negative for dysuria and hematuria.  Musculoskeletal: Negative for back pain, falls and myalgias.  Skin: Negative for rash.  Neurological: Positive for dizziness. Negative for tremors, sensory change, speech change, focal weakness, seizures and headaches.  Endo/Heme/Allergies: Does not bruise/bleed easily.  Psychiatric/Behavioral: Negative for depression and memory loss. The patient is not nervous/anxious.     MEDICATIONS AT HOME:   Prior to Admission medications   Medication Sig Start Date End Date Taking? Authorizing Provider  albuterol (PROVENTIL HFA;VENTOLIN HFA) 108 (90 Base) MCG/ACT inhaler Inhale 2 puffs into the lungs every 6 (six) hours as needed for wheezing or shortness of breath. 02/17/18  Yes Cammie Sickle, MD  aspirin EC 81 MG tablet Take 81 mg by mouth daily.   Yes [provider]  Aspirin-Salicylamide-Caffeine (BC HEADACHE POWDER PO) Take 1 packet by mouth daily as needed (headache).    Yes [provider]  atorvastatin (LIPITOR) 40 MG tablet Take 1 tablet (40 mg total) by mouth daily. 02/23/19  Yes Cammie Sickle, MD  busPIRone (BUSPAR) 5 MG tablet Take 5 mg by mouth 2 (two) times daily. 01/14/19  Yes [provider]  cholecalciferol (VITAMIN D) 400 units TABS tablet Take 400 Units by mouth daily.   Yes [provider]  clopidogrel (PLAVIX) 75 MG tablet Take 75 mg by mouth daily.   Yes [provider]  CVS CITRATE OF MAGNESIA SOLN Take 15 mLs by mouth as needed for constipation. 02/17/19  Yes [provider]  DULoxetine (CYMBALTA) 30 MG capsule Take 60 mg by mouth daily.    Yes [provider]  fentaNYL (DURAGESIC) 75 MCG/HR Place 1 patch onto the skin every 3 (three) days.  03/16/19  Yes Cammie Sickle, MD  fluticasone (FLONASE) 50 MCG/ACT nasal spray Place 2 sprays into both nostrils daily. 12/27/17  Yes Burns, Wandra Feinstein, NP  ipratropium-albuterol (DUONEB) 0.5-2.5 (3) MG/3ML SOLN Take 3 mLs by nebulization every 4 (four) hours as needed. 04/04/17  Yes Cammie Sickle, MD  levothyroxine (SYNTHROID) 75 MCG tablet Take 1 tablet (75 mcg total) by mouth daily before breakfast. 04/09/19  Yes Cammie Sickle, MD  loperamide (IMODIUM) 1 MG/5ML solution Take 4 mg by mouth as needed for diarrhea or loose stools.   Yes [provider]  loratadine (CLARITIN) 10 MG tablet Take 10 mg by mouth daily.   Yes [provider]  MELATONIN PO Take 10 mg by mouth at bedtime.    Yes [provider]  metFORMIN (GLUCOPHAGE) 500 MG tablet TAKE 1 TABLET (500 MG TOTAL) BY MOUTH 2 (TWO) TIMES DAILY WITH A MEAL. 02/26/19  Yes Cammie Sickle, MD  Misc Natural Products (GLUCOSAMINE CHOND COMPLEX/MSM)  TABS Take 2 tablets by mouth daily.   Yes [provider]  montelukast (SINGULAIR) 10 MG tablet TAKE 1 TABLET BY MOUTH EVERYDAY AT BEDTIME Patient taking differently: Take 10 mg by mouth at bedtime.  04/06/19  Yes Cammie Sickle, MD  nitroGLYCERIN (NITROSTAT) 0.4 MG SL tablet Place 1 tablet (0.4 mg total) under the tongue every 5 (five) minutes as needed. 03/16/19  Yes Cammie Sickle, MD  Omega-3 Fatty Acids (FISH OIL) 1000 MG CPDR Take 1,000 mg by mouth daily.    Yes [provider]  oxyCODONE 10 MG TABS Take 1-2 tablets (10-20 mg total) by mouth every 4 (four) hours as needed for breakthrough pain. 04/10/19  Yes Verlon Au, NP  pantoprazole (PROTONIX) 40 MG tablet Take 1 tablet (40 mg total) by mouth 2 (two) times daily before a meal. 04/21/19 06/20/19 Yes Sainani, Belia Heman, MD  tamsulosin (FLOMAX) 0.4 MG CAPS capsule TAKE 1 CAPSULE (0.4 MG TOTAL) BY MOUTH EVERY MORNING. 07/03/18  Yes Verlon Au, NP  temazepam (RESTORIL) 7.5  MG capsule Take 1 capsule (7.5 mg total) by mouth at bedtime as needed for sleep. 02/23/19  Yes Cammie Sickle, MD  Tiotropium Bromide Monohydrate (SPIRIVA RESPIMAT) 1.25 MCG/ACT AERS Inhale 1.25 Act into the lungs 2 (two) times daily. 02/17/18  Yes Cammie Sickle, MD  tiZANidine (ZANAFLEX) 4 MG tablet Take 4 mg by mouth daily.  06/23/18  Yes [provider]  Turmeric 500 MG TABS Take 500 mg by mouth daily.    Yes [provider]     VITAL SIGNS:  Blood pressure 95/82, pulse 68, temperature 98.4 F (36.9 C), temperature source Oral, resp. rate 16, SpO2 98 %.  PHYSICAL EXAMINATION:  Physical Exam  GENERAL:  78 y.o.-year-old patient lying in the bed with no acute distress.  Looks pale EYES: Pupils equal, round, reactive to light and accommodation. No scleral icterus. Extraocular muscles intact.  HEENT: Head atraumatic, normocephalic. Oropharynx and nasopharynx clear. No oropharyngeal erythema, dry oral mucosa  NECK:  Supple, no jugular venous distention. No thyroid enlargement, no tenderness.  LUNGS: Normal breath sounds bilaterally, no wheezing, rales, rhonchi. No use of accessory muscles of respiration.  CARDIOVASCULAR: S1, S2 normal. No murmurs, rubs, or gallops.  ABDOMEN: Soft, nontender, nondistended. Bowel sounds present. No organomegaly or mass.  EXTREMITIES: No pedal edema, cyanosis, or clubbing. + 2 pedal & radial pulses b/l.   NEUROLOGIC: Cranial nerves II through XII are intact. No focal Motor or sensory deficits appreciated b/l PSYCHIATRIC: The patient is alert and oriented x 3.  SKIN: No obvious rash, lesion, or ulcer.   LABORATORY PANEL:   CBC Recent Labs  Lab 04/22/19 1146  WBC 8.6  HGB 7.7*  HCT 23.6*  PLT 150   ------------------------------------------------------------------------------------------------------------------  Chemistries  Recent Labs  Lab 04/22/19 1146  NA 138  K 3.2*  CL 107  CO2 24  GLUCOSE 91  BUN 11   CREATININE 1.17  CALCIUM 7.9*  AST 19  ALT 14  ALKPHOS 74  BILITOT 0.9   ------------------------------------------------------------------------------------------------------------------  Cardiac Enzymes Recent Labs  Lab 04/22/19 1146  TROPONINI <0.03   ------------------------------------------------------------------------------------------------------------------  RADIOLOGY:  No results found.   IMPRESSION AND PLAN:   *Anemia secondary to recent GI bleed.  No bowel movement since discharge.  Will repeat hemoglobin today evening and tomorrow morning.  If hemoglobin is stable will not need any further GI work-up.  Continue Protonix twice daily.  Hold aspirin and Plavix.  Plan was  to hold it for 5 days till 04/26/2019.  *Hypotension.  Patient is not on any antihypertensives at home.  Blood pressure normally runs around 096 systolic.  At this point he seems to be dehydrated.  But GI bleed is definite concern.  Blood pressure has responded to IV fluids and will continue maintenance fluids.  Monitor blood pressure closely.  *Acute encephalopathy likely due to inpatient delirium.  At this time he is alert and oriented.  Recent CT scan of the head reviewed and showed nothing acute.  No focal weakness or numbness found. I have discontinued his fentanyl patch to see if this improves his mental status.  Continue oxycodone as needed  *Metastatic renal cell carcinoma.  Follows with Dr. Bradd Canary as outpatient.  *Hypothyroidism.  Continue Synthroid  DVT prophylaxis with SCDs.  All the records are reviewed and case discussed with ED provider. Management plans discussed with the patient, family and they are in agreement.  CODE STATUS: DNR  TOTAL TIME TAKING CARE OF THIS PATIENT: 40 minutes.   Leia Alf Kaeya Schiffer M.D on 04/22/2019 at 3:48 PM  Between 7am to 6pm - Pager - [phone]  After 6pm go to [email or web address] - password [email or web address] [facility]  Office   [phone]  CC: Primary care physician; Dion Body, MD  Note: This dictation was prepared with Dragon dictation along with smaller phrase technology. Any transcriptional errors that result from this process are unintentional.

## 2019-04-22 NOTE — ED Notes (Signed)
Patient password established by this RN, patient and wife at this time.

## 2019-04-22 NOTE — ED Notes (Signed)
PT given phone to speak with wife

## 2019-04-23 ENCOUNTER — Other Ambulatory Visit: Payer: Self-pay

## 2019-04-23 DIAGNOSIS — G893 Neoplasm related pain (acute) (chronic): Secondary | ICD-10-CM

## 2019-04-23 DIAGNOSIS — I959 Hypotension, unspecified: Secondary | ICD-10-CM | POA: Diagnosis present

## 2019-04-23 DIAGNOSIS — Z515 Encounter for palliative care: Secondary | ICD-10-CM | POA: Diagnosis present

## 2019-04-23 DIAGNOSIS — Z9221 Personal history of antineoplastic chemotherapy: Secondary | ICD-10-CM | POA: Diagnosis not present

## 2019-04-23 DIAGNOSIS — C7800 Secondary malignant neoplasm of unspecified lung: Secondary | ICD-10-CM | POA: Diagnosis present

## 2019-04-23 DIAGNOSIS — C7951 Secondary malignant neoplasm of bone: Secondary | ICD-10-CM | POA: Diagnosis present

## 2019-04-23 DIAGNOSIS — I2510 Atherosclerotic heart disease of native coronary artery without angina pectoris: Secondary | ICD-10-CM | POA: Diagnosis present

## 2019-04-23 DIAGNOSIS — K921 Melena: Secondary | ICD-10-CM

## 2019-04-23 DIAGNOSIS — Z8249 Family history of ischemic heart disease and other diseases of the circulatory system: Secondary | ICD-10-CM | POA: Diagnosis not present

## 2019-04-23 DIAGNOSIS — C649 Malignant neoplasm of unspecified kidney, except renal pelvis: Secondary | ICD-10-CM | POA: Diagnosis present

## 2019-04-23 DIAGNOSIS — Z955 Presence of coronary angioplasty implant and graft: Secondary | ICD-10-CM | POA: Diagnosis not present

## 2019-04-23 DIAGNOSIS — I10 Essential (primary) hypertension: Secondary | ICD-10-CM | POA: Diagnosis present

## 2019-04-23 DIAGNOSIS — Z905 Acquired absence of kidney: Secondary | ICD-10-CM | POA: Diagnosis not present

## 2019-04-23 DIAGNOSIS — Z7189 Other specified counseling: Secondary | ICD-10-CM | POA: Diagnosis not present

## 2019-04-23 DIAGNOSIS — G9340 Encephalopathy, unspecified: Secondary | ICD-10-CM | POA: Diagnosis present

## 2019-04-23 DIAGNOSIS — C642 Malignant neoplasm of left kidney, except renal pelvis: Secondary | ICD-10-CM | POA: Diagnosis not present

## 2019-04-23 DIAGNOSIS — E039 Hypothyroidism, unspecified: Secondary | ICD-10-CM | POA: Diagnosis present

## 2019-04-23 DIAGNOSIS — Z807 Family history of other malignant neoplasms of lymphoid, hematopoietic and related tissues: Secondary | ICD-10-CM | POA: Diagnosis not present

## 2019-04-23 DIAGNOSIS — J449 Chronic obstructive pulmonary disease, unspecified: Secondary | ICD-10-CM | POA: Diagnosis present

## 2019-04-23 DIAGNOSIS — D500 Iron deficiency anemia secondary to blood loss (chronic): Secondary | ICD-10-CM

## 2019-04-23 DIAGNOSIS — R627 Adult failure to thrive: Secondary | ICD-10-CM | POA: Diagnosis present

## 2019-04-23 DIAGNOSIS — D62 Acute posthemorrhagic anemia: Secondary | ICD-10-CM | POA: Diagnosis present

## 2019-04-23 DIAGNOSIS — C7889 Secondary malignant neoplasm of other digestive organs: Secondary | ICD-10-CM | POA: Diagnosis present

## 2019-04-23 DIAGNOSIS — Z66 Do not resuscitate: Secondary | ICD-10-CM | POA: Diagnosis present

## 2019-04-23 DIAGNOSIS — Z1159 Encounter for screening for other viral diseases: Secondary | ICD-10-CM | POA: Diagnosis not present

## 2019-04-23 LAB — CBC
HCT: 21.7 % — ABNORMAL LOW (ref 39.0–52.0)
Hemoglobin: 6.9 g/dL — ABNORMAL LOW (ref 13.0–17.0)
MCH: 29.9 pg (ref 26.0–34.0)
MCHC: 31.8 g/dL (ref 30.0–36.0)
MCV: 93.9 fL (ref 80.0–100.0)
Platelets: 135 K/uL — ABNORMAL LOW (ref 150–400)
RBC: 2.31 MIL/uL — ABNORMAL LOW (ref 4.22–5.81)
RDW: 16.7 % — ABNORMAL HIGH (ref 11.5–15.5)
WBC: 6.2 K/uL (ref 4.0–10.5)
nRBC: 0 % (ref 0.0–0.2)

## 2019-04-23 LAB — BASIC METABOLIC PANEL WITH GFR
Anion gap: 3 — ABNORMAL LOW (ref 5–15)
BUN: 11 mg/dL (ref 8–23)
CO2: 25 mmol/L (ref 22–32)
Calcium: 7.4 mg/dL — ABNORMAL LOW (ref 8.9–10.3)
Chloride: 111 mmol/L (ref 98–111)
Creatinine, Ser: 1.11 mg/dL (ref 0.61–1.24)
GFR calc Af Amer: >60 mL/min (ref >60)
GFR calc non Af Amer: >60 mL/min (ref >60)
Glucose, Bld: 100 mg/dL — ABNORMAL HIGH (ref 70–99)
Potassium: 3.9 mmol/L (ref 3.5–5.1)
Sodium: 139 mmol/L (ref 135–145)

## 2019-04-23 LAB — PREPARE RBC (CROSSMATCH)

## 2019-04-23 LAB — HEMOGLOBIN AND HEMATOCRIT, BLOOD
HCT: 22.4 % — ABNORMAL LOW (ref 39.0–52.0)
HCT: 23.3 % — ABNORMAL LOW (ref 39.0–52.0)
Hemoglobin: 7.2 g/dL — ABNORMAL LOW (ref 13.0–17.0)
Hemoglobin: 7.7 g/dL — ABNORMAL LOW (ref 13.0–17.0)

## 2019-04-23 MED ORDER — SODIUM CHLORIDE 0.9% IV SOLUTION: Freq: Once | INTRAVENOUS | Status: DC

## 2019-04-23 MED ORDER — SODIUM CHLORIDE 0.9% IV SOLUTION
Freq: Once | INTRAVENOUS | Status: AC
  Administered 2019-04-23: 12:00:00 via INTRAVENOUS

## 2019-04-23 NOTE — Progress Notes (Signed)
[facility] [place] NAME: Glen Davis    MR#:  [id number]  DATE OF BIRTH:  16-Jul-1941  SUBJECTIVE:  CHIEF COMPLAINT: Patient is resting comfortably denies any complaints  REVIEW OF SYSTEMS:  CONSTITUTIONAL: No fever, denies any fatigue EYES: No blurred or double vision.  EARS, NOSE, AND THROAT: No tinnitus or ear pain.  RESPIRATORY: No cough, shortness of breath, wheezing or hemoptysis.  CARDIOVASCULAR: No chest pain, orthopnea, edema.  GASTROINTESTINAL: No nausea, vomiting, diarrhea or abdominal pain.  GENITOURINARY: No dysuria, hematuria.  ENDOCRINE: No polyuria, nocturia,  HEMATOLOGY: No anemia, easy bruising or bleeding SKIN: No rash or lesion. MUSCULOSKELETAL: No joint pain or arthritis.   NEUROLOGIC: No tingling, numbness, weakness.  PSYCHIATRY: No anxiety or depression.   DRUG ALLERGIES:   Allergies  Allergen Reactions  . Lisinopril Swelling  . Other Itching    Nitroglycerin Patch.    VITALS:  Blood pressure (!) 91/53, pulse 65, temperature 98.8 F (37.1 C), temperature source Oral, resp. rate 16, height 6\' 1"  (1.854 m), weight 78.6 kg, SpO2 93 %.  PHYSICAL EXAMINATION:  GENERAL:  78 y.o.-year-old patient lying in the bed with no acute distress.  EYES: Pupils equal, round, reactive to light and accommodation. No scleral icterus. Extraocular muscles intact.  HEENT: Head atraumatic, normocephalic. Oropharynx and nasopharynx clear.  NECK:  Supple, no jugular venous distention. No thyroid enlargement, no tenderness.  LUNGS: Normal breath sounds bilaterally, no wheezing, rales,rhonchi or crepitation. No use of accessory muscles of respiration.  CARDIOVASCULAR: S1, S2 normal. No murmurs, rubs, or gallops.  ABDOMEN: Soft, nontender, nondistended. Bowel sounds present.  EXTREMITIES: No pedal edema, cyanosis, or clubbing.  NEUROLOGIC: Cranial nerves II through XII are intact.Sensation intact. Gait not checked.   PSYCHIATRIC: The patient is alert and oriented x 3.  SKIN: No obvious rash, lesion, or ulcer.    LABORATORY PANEL:   CBC Recent Labs  Lab 04/23/19 0522 04/23/19 0833  WBC 6.2  --   HGB 6.9* 7.2*  HCT 21.7* 22.4*  PLT 135*  --    ------------------------------------------------------------------------------------------------------------------  Chemistries  Recent Labs  Lab 04/22/19 1146 04/23/19 0522  NA 138 139  K 3.2* 3.9  CL 107 111  CO2 24 25  GLUCOSE 91 100*  BUN 11 11  CREATININE 1.17 1.11  CALCIUM 7.9* 7.4*  AST 19  --   ALT 14  --   ALKPHOS 74  --   BILITOT 0.9  --    ------------------------------------------------------------------------------------------------------------------  Cardiac Enzymes Recent Labs  Lab 04/22/19 1146  TROPONINI <0.03   ------------------------------------------------------------------------------------------------------------------  RADIOLOGY:  No results found.  EKG:   Orders placed or performed during the hospital encounter of 04/22/19  . ED EKG  . ED EKG    ASSESSMENT AND PLAN:   *Symptomatic anemia secondary to recent GI bleed.  Status post endoscopy Hemoglobin at 6.9.  1 unit of PRBC given and a repeat hemoglobin at 7.2 .  Continue Protonix twice daily.  Hold aspirin and Plavix.  Plan was to hold it for 5 days till 04/26/2019. GI consult.  Seen by Dr. Marius Ditch in the past will consult Dr. Marius Ditch  *Hypotension.  Patient is not on any antihypertensives at home.  Blood pressure normally runs around 606 systolic.  At this point he seems to be dehydrated.  But GI bleed is definite concern.  Blood pressure has responded to IV fluids and will continue maintenance fluids.  Monitor blood pressure closely.  *Acute encephalopathy -patient  is awake and alert and mentating fine today.  Recent CT scan of the head reviewed and showed nothing acute.  No focal weakness or numbness found. .  Continue oxycodone as needed.  Fentanyl  patch on hold will resume if patient mentation is stable  *Metastatic renal cell carcinoma.  Follows with Dr. Bradd Canary as outpatient.  Appreciate Dr. Rogue Bussing recommendations  *Hypothyroidism.  Continue Synthroid  DVT prophylaxis with SCDs.   Prognosis is poor from the renal cell carcinoma standpoint  All the records are reviewed and case discussed with Care Management/Social Workerr. Management plans discussed with the patient, he is  in agreement.  CODE STATUS:dnr   TOTAL TIME TAKING CARE OF THIS PATIENT: 35 minutes.   POSSIBLE D/C IN 1-2  DAYS, DEPENDING ON CLINICAL CONDITION.  Note: This dictation was prepared with Dragon dictation along with smaller phrase technology. Any transcriptional errors that result from this process are unintentional.   Nicholes Mango M.D on 04/23/2019 at 4:58 PM  Between 7am to 6pm - Pager - [phone] After 6pm go to [email or web address] - password [email or web address] [facility]  Office  [phone]  CC: Primary care physician; Dion Body, MD

## 2019-04-23 NOTE — Care Management Obs Status (Signed)
[facility] NOTIFICATION   Patient Details  Name: Glen Davis MRN: [id number] Date of Birth: 1941/09/18   Medicare Observation Status Notification Given:  Yes    Ross Ludwig, LCSW 04/23/2019, 3:22 PM

## 2019-04-23 NOTE — TOC Initial Note (Addendum)
Transition of Care [facility]) - Initial/Assessment Note    Patient Details  Name: Glen Davis MRN: [id number] Date of Birth: Oct 17, 1941  Transition of [facility]) CM/SW Contact:    Ross Ludwig, LCSW Phone Number: 04/23/2019, 6:09 PM  Clinical Narrative:                  Patient is a 78 year old male who lives with his wife.  Patient states that he has had home health in the past, but he does not want it again.  Patient is alert and oriented x3, patient was explained role of CSW.  Patient expressed he is hopeful that he can return back home.  Patient stated his wife usually takes care of the home health decisions.  Patient was initially on observation, and he has now converted to inpatient.  Readmissions risk completed, patient did not express any problems with returning back home.  Patient is currently being followed by palliative at home.  Expected Discharge Plan: [place] Barriers to Discharge: Continued Medical Work up   Patient Goals and CMS Choice   CMS Medicare.gov Compare Post Acute Care list provided to:: Patient    Expected Discharge Plan and Services Expected Discharge Plan: [place] In-house Referral: NA Discharge Planning Services: CM Consult   Living arrangements for the past 2 months: Single Family Home                 DME Arranged: N/A DME Agency: NA       HH Arranged: NA HH Agency: NA        Prior Living Arrangements/Services Living arrangements for the past 2 months: Single Family Home Lives with:: Spouse Patient language and need for interpreter reviewed:: Yes Do you feel safe going back to the place where you live?: Yes      Need for Family Participation in Patient Care: Yes (Comment) Care giver support system in place?: Yes (comment)   Criminal Activity/Legal Involvement Pertinent to Current Situation/Hospitalization: No - Comment as needed  Activities of Daily Living Home Assistive Devices/Equipment:  None ADL Screening (condition at time of admission) Patient's cognitive ability adequate to safely complete daily activities?: Yes Is the patient deaf or have difficulty hearing?: No Does the patient have difficulty seeing, even when wearing glasses/contacts?: No Does the patient have difficulty concentrating, remembering, or making decisions?: No Patient able to express need for assistance with ADLs?: Yes Does the patient have difficulty dressing or bathing?: Yes Independently performs ADLs?: Yes (appropriate for developmental age) Does the patient have difficulty walking or climbing stairs?: No Weakness of Legs: None Weakness of Arms/Hands: None  Permission Sought/Granted Permission sought to share information with : Family Supports Permission granted to share information with : Yes, Verbal Permission Granted  Share Information with NAME: Soledad,[name] Spouse [phone]  [phone]   Permission granted to share info w AGENCY: [facility]        Emotional Assessment Appearance:: Appears stated age   Affect (typically observed): Appropriate, Calm, Pleasant, Stable Orientation: : Oriented to Self, Oriented to Place, Oriented to Situation Alcohol / Substance Use: Not Applicable Psych Involvement: No (comment)  Admission diagnosis:  Black stool [K92.1] Hypotension, unspecified hypotension type [I95.9] Altered mental status, unspecified altered mental status type [R41.82] Patient Active Problem List   Diagnosis Date Noted  . Hypotension 04/22/2019  . Hematemesis 04/16/2019  . Unsp fracture of upper end of unsp tibia, init for clos fx 02/10/2019  . Cancer,  metastatic to bone ([place]) 05/18/2018  . Orthostatic hypotension 03/24/2018  . Acute sinusitis 02/06/2017  . Cough 10/09/2016  . SOB (shortness of breath) 09/13/2016  . Syncope 08/17/2016  . Situational anxiety 06/05/2016  . Cancer of left kidney excluding renal pelvis ([place]) 2020-03-116  . 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 Coronary artery disease involving native coronary artery of native heart without angina pectoris 03/16/2016  . Essential (primary) hypertension 03/16/2016  . Hypertriglyceridemia 03/16/2016  . Left thyroid nodule 02/03/2016  . Pulmonary nodules/lesions, multiple 02/03/2016  . Counseling regarding goals of care 01/30/2016  . Vaccine counseling 01/30/2016  . 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 Melanoma of skin ([place]) 01/12/2016  . Breathlessness on exertion 04/25/2015   PCP:  Dion Body, MD Pharmacy:   [id number] - [place], [place] - [address] [place] [address] Phone: [phone] Fax: [place], [place] - [place] [address] Phone: [phone] Fax: [phone]     Social Determinants of Health ([place]) Interventions    Readmission Risk Interventions Readmission Risk Prevention Plan 04/23/2019 04/18/2019  Transportation Screening Complete -  PCP or Specialist Appt within 3-5 Days Complete -  HRI or [place] Complete Complete  Social Work Consult for [place] Planning/Counseling Complete -  Palliative Care Screening Complete -  Medication Review Press photographer) Complete Complete  Some recent data might be hidden

## 2019-04-23 NOTE — Progress Notes (Signed)
Please note, patient has been followed by outpatient palliative at home. CSW Evette Cristal made aware. Flo Shanks BSN, RN, [facility] [employer] [phone]

## 2019-04-23 NOTE — Progress Notes (Addendum)
Cephas Darby, MD [address]  [place]  [place], [place] [address]  Main: [phone]  Fax: [phone] Pager: [phone]   Consultation  Referring Provider:     No ref. provider found Primary Care Physician:  Dion Body, MD Primary Gastroenterologist: None         Reason for Consultation:     Symptomatic anemia  Date of Admission:  04/22/2019 Date of Consultation:  04/23/2019         HPI:   Glen Davis is a 78 y.o. male a known history of metastatic renal cell carcinoma, recent admission with anemia, hematemesis.  EGD revealed ulcerated gastric submucosal lesions which were resected.  Patient was discharged home on 6/2.  He returned yesterday due to weakness, black tarry stool.  His hemoglobin was 6.9, received 1 unit of PRBCs.  Today, he denied black stools. The gastric mucosal lesions were confirmed as metastatic renal cell carcinoma on pathology.  Patient is seen by Dr. Rogue Bussing today.  Apparently, patient was treated with multiple lines of therapy but continued to have progression of disease.  When I interviewed the patient, he was eating his dinner, denies abdominal pain, nausea, vomiting, hematemesis or melena.  He did not restart Plavix since discharge.   NSAIDs: None  Antiplts/Anticoagulants/Anti thrombotics: Off Plavix  GI Procedures: EGD 04/21/2019 - Two gastric polyps/submucosal ulcerated lesions, source of hematemesis. Resected and retrieved. Clips (MR conditional) were placed. Injected. - Normal gastroesophageal junction and esophagus. - Normal duodenum  DIAGNOSIS:  A. STOMACH POLYP X 2; HOT SNARE:  - ULCERATED GASTRIC FUNDIC MUCOSA WITH METASTATIC CARCINOMA INVOLVING  SUBMUCOSA, MORPHOLOGICALLY CONSISTENT WITH PATIENT'S KNOWN CLEAR CELL  RENAL CELL CARCINOMA.   Past Medical History:  Diagnosis Date  . CAD (coronary artery disease)   . Cancer ([place])    left arm  . COPD (chronic obstructive pulmonary disease) ([place])   . Hypertension    . Kidney stone   . Lung cancer ([place])   . Melanoma ([place]) 01/24/2016   right shoulder  . Thyroid nodule 02/02/2016   left BENIGN THYROID NODULE by FNA    Past Surgical History:  Procedure Laterality Date  . arm surgery Left    arm  . cardiac stents  2011   Angioplasty / Stenting Femoral-X2  . COLONOSCOPY  04/01/12  . CORONARY ANGIOPLASTY    . ESOPHAGOGASTRODUODENOSCOPY N/A 04/21/2019   Procedure: ESOPHAGOGASTRODUODENOSCOPY (EGD);  Surgeon: Lin Landsman, MD;  Location: [facility] ENDOSCOPY;  Service: Gastroenterology;  Laterality: N/A;  . EXCISION MELANOMA WITH SENTINEL LYMPH NODE BIOPSY Right 01/24/2016   Procedure: EXCISION MELANOMA Right Shoulder;  Surgeon: Robert Bellow, MD;  Location: [facility] ORS;  Service: General;  Laterality: Right;  . LAPAROSCOPIC NEPHRECTOMY, HAND ASSISTED Left 04/24/2016   Procedure: HAND ASSISTED LAPAROSCOPIC NEPHRECTOMY;  Surgeon: Hollice Espy, MD;  Location: [facility] ORS;  Service: Urology;  Laterality: Left;  . TIBIA IM NAIL INSERTION Left 02/11/2019   Procedure: INTRAMEDULLARY (IM) NAIL TIBIAL;  Surgeon: Lovell Sheehan, MD;  Location: [facility] ORS;  Service: Orthopedics;  Laterality: Left;  Marland Kitchen VIDEO ASSISTED THORACOSCOPY (VATS)/THOROCOTOMY Left 03/08/2016   Procedure: VIDEO ASSISTED THORACOSCOPY (VATS) with lung biopsy - Left ;  Surgeon: Robert Bellow, MD;  Location: [facility] ORS;  Service: General;  Laterality: Left;    Prior to Admission medications   Medication Sig Start Date End Date Taking? Authorizing Provider  albuterol (PROVENTIL HFA;VENTOLIN HFA) 108 (90 Base) MCG/ACT inhaler Inhale 2 puffs into the lungs every 6 (six)  hours as needed for wheezing or shortness of breath. 02/17/18  Yes Cammie Sickle, MD  aspirin EC 81 MG tablet Take 81 mg by mouth daily.   Yes [provider]  Aspirin-Salicylamide-Caffeine (BC HEADACHE POWDER PO) Take 1 packet by mouth daily as needed (headache).    Yes [provider]  atorvastatin (LIPITOR) 40 MG  tablet Take 1 tablet (40 mg total) by mouth daily. 02/23/19  Yes Cammie Sickle, MD  busPIRone (BUSPAR) 5 MG tablet Take 5 mg by mouth 2 (two) times daily. 01/14/19  Yes [provider]  cholecalciferol (VITAMIN D) 400 units TABS tablet Take 400 Units by mouth daily.   Yes [provider]  clopidogrel (PLAVIX) 75 MG tablet Take 75 mg by mouth daily.   Yes [provider]  CVS CITRATE OF MAGNESIA SOLN Take 15 mLs by mouth as needed for constipation. 02/17/19  Yes [provider]  DULoxetine (CYMBALTA) 30 MG capsule Take 60 mg by mouth daily.    Yes [provider]  fentaNYL (DURAGESIC) 75 MCG/HR Place 1 patch onto the skin every 3 (three) days. 03/16/19  Yes Cammie Sickle, MD  fluticasone (FLONASE) 50 MCG/ACT nasal spray Place 2 sprays into both nostrils daily. 12/27/17  Yes Burns, Wandra Feinstein, NP  ipratropium-albuterol (DUONEB) 0.5-2.5 (3) MG/3ML SOLN Take 3 mLs by nebulization every 4 (four) hours as needed. 04/04/17  Yes Cammie Sickle, MD  levothyroxine (SYNTHROID) 75 MCG tablet Take 1 tablet (75 mcg total) by mouth daily before breakfast. 04/09/19  Yes Cammie Sickle, MD  loperamide (IMODIUM) 1 MG/5ML solution Take 4 mg by mouth as needed for diarrhea or loose stools.   Yes [provider]  loratadine (CLARITIN) 10 MG tablet Take 10 mg by mouth daily.   Yes [provider]  MELATONIN PO Take 10 mg by mouth at bedtime.    Yes [provider]  metFORMIN (GLUCOPHAGE) 500 MG tablet TAKE 1 TABLET (500 MG TOTAL) BY MOUTH 2 (TWO) TIMES DAILY WITH A MEAL. 02/26/19  Yes Cammie Sickle, MD  Misc Natural Products (GLUCOSAMINE CHOND COMPLEX/MSM) TABS Take 2 tablets by mouth daily.   Yes [provider]  montelukast (SINGULAIR) 10 MG tablet TAKE 1 TABLET BY MOUTH EVERYDAY AT BEDTIME Patient taking differently: Take 10 mg by mouth at bedtime.  04/06/19  Yes Cammie Sickle, MD  nitroGLYCERIN  (NITROSTAT) 0.4 MG SL tablet Place 1 tablet (0.4 mg total) under the tongue every 5 (five) minutes as needed. 03/16/19  Yes Cammie Sickle, MD  Omega-3 Fatty Acids (FISH OIL) 1000 MG CPDR Take 1,000 mg by mouth daily.    Yes [provider]  oxyCODONE 10 MG TABS Take 1-2 tablets (10-20 mg total) by mouth every 4 (four) hours as needed for breakthrough pain. 04/10/19  Yes Verlon Au, NP  pantoprazole (PROTONIX) 40 MG tablet Take 1 tablet (40 mg total) by mouth 2 (two) times daily before a meal. 04/21/19 06/20/19 Yes Sainani, Belia Heman, MD  tamsulosin (FLOMAX) 0.4 MG CAPS capsule TAKE 1 CAPSULE (0.4 MG TOTAL) BY MOUTH EVERY MORNING. 07/03/18  Yes Verlon Au, NP  temazepam (RESTORIL) 7.5 MG capsule Take 1 capsule (7.5 mg total) by mouth at bedtime as needed for sleep. 02/23/19  Yes Cammie Sickle, MD  Tiotropium Bromide Monohydrate (SPIRIVA RESPIMAT) 1.25 MCG/ACT AERS Inhale 1.25 Act into the lungs 2 (two) times daily. 02/17/18  Yes Cammie Sickle, MD  tiZANidine (ZANAFLEX) 4 MG  tablet Take 4 mg by mouth daily.  06/23/18  Yes [provider]  Turmeric 500 MG TABS Take 500 mg by mouth daily.    Yes [provider]   Current Facility-Administered Medications:  .  0.9 %  sodium chloride infusion (Manually program via Guardrails IV Fluids), , Intravenous, Once, Gouru, Aruna, MD .  acetaminophen (TYLENOL) tablet 650 mg, 650 mg, Oral, Q6H PRN **OR** acetaminophen (TYLENOL) suppository 650 mg, 650 mg, Rectal, Q6H PRN, Sudini, Srikar, MD .  albuterol (PROVENTIL) (2.5 MG/3ML) 0.083% nebulizer solution 2.5 mg, 2.5 mg, Nebulization, Q2H PRN, Sudini, Srikar, MD .  atorvastatin (LIPITOR) tablet 40 mg, 40 mg, Oral, Daily, Sudini, Srikar, MD, 40 mg at 04/23/19 1724 .  busPIRone (BUSPAR) tablet 5 mg, 5 mg, Oral, BID, Sudini, Srikar, MD, 5 mg at 04/23/19 2993 .  docusate sodium (COLACE) capsule 100 mg, 100 mg, Oral, BID, Sudini, Srikar, MD, 100 mg at 04/23/19 0812 .   fluticasone (FLONASE) 50 MCG/ACT nasal spray 2 spray, 2 spray, Each Nare, Daily, Hillary Bow, MD, 2 spray at 04/23/19 [phone] .  lactated ringers infusion, , Intravenous, Continuous, Sudini, Srikar, MD, Last Rate: 75 mL/hr at 04/23/19 1543 .  levothyroxine (SYNTHROID) tablet 75 mcg, 75 mcg, Oral, QAC breakfast, Hillary Bow, MD, 75 mcg at 04/23/19 0719 .  loratadine (CLARITIN) tablet 10 mg, 10 mg, Oral, Daily, Sudini, Srikar, MD, 10 mg at 04/23/19 6789 .  Melatonin TABS 10 mg, 10 mg, Oral, QHS, Sudini, Srikar, MD, 10 mg at 04/22/19 2343 .  montelukast (SINGULAIR) tablet 10 mg, 10 mg, Oral, QHS, Sudini, Srikar, MD, 10 mg at 04/22/19 2344 .  nitroGLYCERIN (NITROSTAT) SL tablet 0.4 mg, 0.4 mg, Sublingual, Q5 min PRN, Sudini, Srikar, MD .  ondansetron (ZOFRAN) tablet 4 mg, 4 mg, Oral, Q6H PRN **OR** ondansetron (ZOFRAN) injection 4 mg, 4 mg, Intravenous, Q6H PRN, Sudini, Srikar, MD .  oxyCODONE (Oxy IR/ROXICODONE) immediate release tablet 10 mg, 10 mg, Oral, Q4H PRN, Sudini, Srikar, MD .  pantoprazole (PROTONIX) EC tablet 40 mg, 40 mg, Oral, BID AC, Sudini, Srikar, MD, 40 mg at 04/23/19 1724 .  polyethylene glycol (MIRALAX / GLYCOLAX) packet 17 g, 17 g, Oral, Daily PRN, Sudini, Srikar, MD .  tamsulosin (FLOMAX) capsule 0.4 mg, 0.4 mg, Oral, BH-q7a, Sudini, Srikar, MD, 0.4 mg at 04/23/19 3810 .  temazepam (RESTORIL) capsule 7.5 mg, 7.5 mg, Oral, QHS PRN, Sudini, Srikar, MD .  tiotropium (SPIRIVA) inhalation capsule ([facility] use ONLY) 18 mcg, 1 capsule, Inhalation, Daily, Hillary Bow, MD, 18 mcg at 04/23/19 1751 .  tiZANidine (ZANAFLEX) tablet 4 mg, 4 mg, Oral, Daily, Sudini, Srikar, MD, 4 mg at 04/23/19 0258  Facility-Administered Medications Ordered in Other Encounters:  .  0.9 %  sodium chloride infusion, , Intravenous, Continuous, Cammie Sickle, MD, Stopped at 04/21/19 5277   Family History  Problem Relation Age of Onset  . Hodgkin's lymphoma Daughter 5  . Heart attack Father   .  Kidney cancer Neg Hx   . Kidney disease Neg Hx   . Prostate cancer Neg Hx      Social History   Tobacco Use  . Smoking status: Former Smoker    Packs/day: 2.00    Years: 30.00    Pack years: 60.00    Types: Cigarettes    Start date: 01/18/1956    Last attempt to quit: 01/17/1969    Years since quitting: 50.2  . Smokeless tobacco: Never Used  Substance Use Topics  . Alcohol use: Not Currently  Alcohol/week: 0.0 standard drinks    Comment: BEER OCC  . Drug use: No    Allergies as of 04/22/2019 - Review Complete 04/22/2019  Allergen Reaction Noted  . Lisinopril Swelling 01/11/2016  . Other Itching 01/11/2016    Review of Systems:    All systems reviewed and negative except where noted in HPI.   Physical Exam:  Vital signs in last 24 hours: Temp:  [97.9 F (36.6 C)-99.1 F (37.3 C)] 97.9 F (36.6 C) (06/04 1933) Pulse Rate:  [61-68] 67 (06/04 1933) Resp:  [10-16] 16 (06/04 1706) BP: (91-123)/(53-90) 123/72 (06/04 1933) SpO2:  [93 %-100 %] 99 % (06/04 1933) Weight:  [78.6 kg] 78.6 kg (06/04 0100) Last BM Date: 04/20/19 General:   Pleasant, cooperative in NAD Head:  Normocephalic and atraumatic. Eyes:   No icterus.   Conjunctiva pale. PERRLA. Ears:  Normal auditory acuity. Neck:  Supple; no masses or thyroidomegaly Lungs: Respirations even and unlabored. Lungs clear to auscultation bilaterally.   No wheezes, crackles, or rhonchi.  Heart:  Regular rate and rhythm;  Without murmur, clicks, rubs or gallops Abdomen:  Soft, nondistended, nontender. Normal bowel sounds. No appreciable masses or hepatomegaly.  No rebound or guarding.  Rectal:  Not performed. Msk:  Symmetrical without gross deformities.  Strength generalized weakness Extremities:  Without edema, cyanosis or clubbing. Neurologic:  Alert and oriented x3;  grossly normal neurologically. Skin:  Intact without significant lesions or rashes. Psych:  Alert and cooperative. Normal affect.  LAB RESULTS: CBC Latest  Ref Rng & Units 04/23/2019 04/23/2019 04/22/2019  WBC 4.0 - 10.5 K/uL - 6.2 -  Hemoglobin 13.0 - 17.0 g/dL 7.2(L) 6.9(L) 7.5(L)  Hematocrit 39.0 - 52.0 % 22.4(L) 21.7(L) -  Platelets 150 - 400 K/uL - 135(L) -    BMET BMP Latest Ref Rng & Units 04/23/2019 04/22/2019 04/20/2019  Glucose 70 - 99 mg/dL 100(H) 91 129(H)  BUN 8 - 23 mg/dL '11 11 14  ' Creatinine 0.61 - 1.24 mg/dL 1.11 1.17 1.02  BUN/Creat Ratio 10 - 24 - - -  Sodium 135 - 145 mmol/L 139 138 136  Potassium 3.5 - 5.1 mmol/L 3.9 3.2(L) 3.6  Chloride 98 - 111 mmol/L 111 107 106  CO2 22 - 32 mmol/L '25 24 25  ' Calcium 8.9 - 10.3 mg/dL 7.4(L) 7.9(L) 7.7(L)    LFT Hepatic Function Latest Ref Rng & Units 04/22/2019 04/16/2019 04/08/2019  Total Protein 6.5 - 8.1 g/dL 6.2(L) 6.7 7.0  Albumin 3.5 - 5.0 g/dL 3.6 3.9 4.1  AST 15 - 41 U/L '19 17 16  ' ALT 0 - 44 U/L '14 12 10  ' Alk Phosphatase 38 - 126 U/L 74 63 77  Total Bilirubin 0.3 - 1.2 mg/dL 0.9 0.6 0.5  Bilirubin, Direct 0.1 - 0.5 mg/dL - - -     STUDIES: No results found.    Impression / Plan:   Glen Davis is a 78 y.o. male with metastatic renal cell carcinoma with recent admission secondary to anemia, hematemesis found to have metastatic lesions in the stomach as well confirmed on pathology.  Patient readmitted with worsening anemia and weakness. Unfortunately, endoscopic intervention will not help to stop the bleeding from metastatic gastric submucosal lesions at this time.  Hemo-spray is recalled which would have been the only therapeutic option.  Recommend palliative care.  Discussed my recommendations with Dr. Margaretmary Eddy and Dr. Rogue Bussing.  Overall prognosis is poor  Thank you for involving me in the care of this patient.  GI will sign  off at this time, please call us back with questions or concerns    LOS: 0 days   Sherri Sear, MD  04/23/2019, 7:38 PM   Note: This dictation was prepared with Dragon dictation along with smaller phrase technology. Any transcriptional errors that result  from this process are unintentional.

## 2019-04-23 NOTE — Assessment & Plan Note (Addendum)
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  Most recent CT scan/ May 2020 showed slightly increasing lung metastases increasing by few millimeters; otherwise overall stable bone mets.  However recent EGD showed showed multiple polypoid lesions biopsy-proven metastatic melanoma [see below].  #Acute mental status changes/hypotension-unclear etiology.  Currently back to baseline.  # Left shoulder pain/ left leg pain-secondary metastasis.stable.   #Recent upper GI bleed-status post endoscopy- monitor hemoglobin closely.  Hemoglobin 7.2 proceed with PRBC transfusion.  Prognosis: Patient's prognosis extremely poor given the progressive melanoma status post multiple lines of therapies.  I discussed my concerns of progressive disease with the patient's wife [name] in detail.  I recommend hospice which family is agreeable to.  However they are interested in having hospice at the patient's own home.  They are interested in discharge tomorrow.

## 2019-04-23 NOTE — Progress Notes (Signed)
Glen Davis   DOB:1941-10-10   ER#:[id number]    Subjective: Patient resting in bed having breakfast.  Denies any nausea vomiting.  He denies any abdominal pain.  Denies any headaches.  Denies any blood in stools or black or stools.  Objective:  Vitals:   04/23/19 1706 04/23/19 1933  BP: 109/70 123/72  Pulse: 61 67  Resp: 16   Temp: 98.5 F (36.9 C) 97.9 F (36.6 C)  SpO2: 98% 99%     Intake/Output Summary (Last 24 hours) at 04/23/2019 2044 Last data filed at 04/23/2019 1947 Gross per 24 hour  Intake 2833.38 ml  Output 1000 ml  Net 1833.38 ml    Physical Exam  Constitutional: He is oriented to person, place, and time and well-developed, well-nourished, and in no distress.  HENT:  Head: Normocephalic and atraumatic.  Mouth/Throat: Oropharynx is clear and moist. No oropharyngeal exudate.  Eyes: Pupils are equal, round, and reactive to light.  Neck: Normal range of motion. Neck supple.  Cardiovascular: Normal rate and regular rhythm.  Pulmonary/Chest: No respiratory distress. He has no wheezes.  Abdominal: Soft. Bowel sounds are normal. He exhibits no distension and no mass. There is no abdominal tenderness. There is no rebound and no guarding.  Musculoskeletal: Normal range of motion.        General: No tenderness or edema.  Neurological: He is alert and oriented to person, place, and time.  Skin: Skin is warm.  Psychiatric: Affect normal.     Labs:  Lab Results  Component Value Date   WBC 6.2 04/23/2019   HGB 7.2 (L) 04/23/2019   HCT 22.4 (L) 04/23/2019   MCV 93.9 04/23/2019   PLT 135 (L) 04/23/2019   NEUTROABS 6.7 04/22/2019    Lab Results  Component Value Date   NA 139 04/23/2019   K 3.9 04/23/2019   CL 111 04/23/2019   CO2 25 04/23/2019    Studies:  No results found.  Cancer of left kidney excluding renal pelvis [facility]) - Assessment & Plan Note        [] Hover for details 78 year old male patient with multiple medical problems-including metastatic  kidney cancer most recently on immunotherapy is currently admitted to hospital for acute mental status changes/melena/hypotension.  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  Most recent CT scan/ May 2020 showed slightly increasing lung metastases increasing by few millimeters; otherwise overall stable bone mets.  However recent EGD showed showed multiple polypoid lesions biopsy-proven metastatic melanoma [see below].  #Acute mental status changes/hypotension-unclear etiology.  Currently back to baseline..  # Left shoulder pain/ left leg pain-secondary metastasis.  #Recent upper GI bleed-status post endoscopy- monitor hemoglobin closely.  Hemoglobin 7.2 proceed with PRBC transfusion.  Prognosis: Patient's prognosis extremely poor given the progressive melanoma status post multiple lines of therapies.  I discussed my concerns of progressive disease with the patient's wife Glen Davis in detail.  I recommend hospice which family is agreeable to.  However they are interested in having hospice at the patient's own home.  They are interested in discharge tomorrow.          Cammie Sickle, MD 04/23/2019  8:44 PM

## 2019-04-23 NOTE — Progress Notes (Signed)
Spoke the Dr Margaretmary Eddy, told her unit of blood ordered this am just finished up at 17:06 as it could not be run due to multiple computer down times.  Clarified with her that we only need to give additional blood prn if hemoglobin is less than 7.0 per new order.

## 2019-04-24 ENCOUNTER — Telehealth: Payer: Self-pay | Admitting: *Deleted

## 2019-04-24 DIAGNOSIS — D630 Anemia in neoplastic disease: Secondary | ICD-10-CM

## 2019-04-24 DIAGNOSIS — Z515 Encounter for palliative care: Secondary | ICD-10-CM

## 2019-04-24 DIAGNOSIS — Z7189 Other specified counseling: Secondary | ICD-10-CM

## 2019-04-24 DIAGNOSIS — C649 Malignant neoplasm of unspecified kidney, except renal pelvis: Secondary | ICD-10-CM

## 2019-04-24 DIAGNOSIS — Z9221 Personal history of antineoplastic chemotherapy: Secondary | ICD-10-CM

## 2019-04-24 DIAGNOSIS — C7889 Secondary malignant neoplasm of other digestive organs: Secondary | ICD-10-CM

## 2019-04-24 DIAGNOSIS — Z66 Do not resuscitate: Secondary | ICD-10-CM

## 2019-04-24 LAB — CBC
HCT: 24.9 % — ABNORMAL LOW (ref 39.0–52.0)
Hemoglobin: 7.9 g/dL — ABNORMAL LOW (ref 13.0–17.0)
MCH: 29.3 pg (ref 26.0–34.0)
MCHC: 31.7 g/dL (ref 30.0–36.0)
MCV: 92.2 fL (ref 80.0–100.0)
Platelets: 138 10*3/uL — ABNORMAL LOW (ref 150–400)
RBC: 2.7 MIL/uL — ABNORMAL LOW (ref 4.22–5.81)
RDW: 17.2 % — ABNORMAL HIGH (ref 11.5–15.5)
WBC: 6.3 10*3/uL (ref 4.0–10.5)
nRBC: 0 % (ref 0.0–0.2)

## 2019-04-24 LAB — COMPREHENSIVE METABOLIC PANEL
ALT: 11 U/L (ref 0–44)
AST: 16 U/L (ref 15–41)
Albumin: 2.9 g/dL — ABNORMAL LOW (ref 3.5–5.0)
Alkaline Phosphatase: 62 U/L (ref 38–126)
Anion gap: 7 (ref 5–15)
BUN: 9 mg/dL (ref 8–23)
CO2: 22 mmol/L (ref 22–32)
Calcium: 7.7 mg/dL — ABNORMAL LOW (ref 8.9–10.3)
Chloride: 110 mmol/L (ref 98–111)
Creatinine, Ser: 0.92 mg/dL (ref 0.61–1.24)
GFR calc Af Amer: >60 mL/min (ref >60)
GFR calc non Af Amer: >60 mL/min (ref >60)
Glucose, Bld: 102 mg/dL — ABNORMAL HIGH (ref 70–99)
Potassium: 3.6 mmol/L (ref 3.5–5.1)
Sodium: 139 mmol/L (ref 135–145)
Total Bilirubin: 0.9 mg/dL (ref 0.3–1.2)
Total Protein: 5.4 g/dL — ABNORMAL LOW (ref 6.5–8.1)

## 2019-04-24 MED ORDER — POLYETHYLENE GLYCOL 3350 17 G PO PACK: 17.0000 g | PACK | Freq: Every day | ORAL | 0 refills | Status: AC | PRN

## 2019-04-24 NOTE — Telephone Encounter (Signed)
Patient is discharging from hospital with referral to hospice and they want to know if Dr B is in agreement and if he will sign orders and be attending

## 2019-04-24 NOTE — Progress Notes (Signed)
New referral for [place] hospice services at home received from [place] following a Palliative Medicine consult. Patient is a 78 year old man with metastatic kidney cancer admitted to [facility] on 6/3 with altered mental status. He was recently discharged following an upper GI bleed with gastric polypectomy. He was admitted from [facility] clinic with confusion, weakness and hypotension. During his hospital stay he has continued with down trending hemoglobin requiring a blood transfusion on 6/4. Both GI and Oncology have been consulted and feel no further treatment can be offered. Hospice was recommended and patient and family agreed. Palliative NP Ardelle Park met this morning to confirm patient's choice. Writer met with patient, he was alert and oriented, with slight confusion regarding his remote and the IV beeping, but this cleared when IV alarm was silenced. Writer initiated education regarding hospice services, philosophy and team approach to care with understanding voiced. Patient is currently on oxygen at 2 liters with oxygen in place at home, he owns his hospital bed and walker, no current DME needs at time of discharge. Writer also spoke with patient's wife [name] at [phone] to discuss hospice services. Both patient and his wife are in agreement with plan for discharge home with the support of hospice services. Patient information faxed to referral. Hospital care team updated. Patient to discharge home via [place] today with signed out of facility DNR in place.  Flo Shanks BSN, RN, [facility] [phone]

## 2019-04-24 NOTE — Telephone Encounter (Signed)
I can sign the hospice orders.

## 2019-04-24 NOTE — Progress Notes (Signed)
Spoke with pt s son and wife on phone.  Discharge instructions to wife/ meds diet activity and f/u discussed .  Verbalize understanding.   They will be here shortly to pick pt up. Sl d/cd

## 2019-04-24 NOTE — TOC Transition Note (Signed)
Transition of Care [facility]) - CM/SW Discharge Note   Patient Details  Name: Glen Davis MRN: [id number] Date of Birth: [birth year]  [facility] CM/SW Contact:  Annamaria Boots, [place] Phone Number: 04/24/2019, 10:58 AM   Clinical Narrative:   Patient and family have chosen to return home with hospice services through [facility]. Patient and wife would like to transfer home today and MD is in agreement. CSW notified Santiago Glad with [facility] of discharge today. Patient will be transported by wife.     Final next level of care: Home w Hospice Care Barriers to Discharge: No Barriers Identified   Patient Goals and CMS Choice Patient states their goals for this hospitalization and ongoing recovery are:: comfort care  CMS Medicare.gov Compare Post Acute Care list provided to:: Patient    Discharge Placement                    Patient and family notified of of transfer: 04/24/19  Discharge Plan and Services In-house Referral: NA Discharge Planning Services: CM Consult            DME Arranged: N/A DME Agency: NA       HH Arranged: NA HH Agency: NA        Social Determinants of Health (SDOH) Interventions     Readmission Risk Interventions Readmission Risk Prevention Plan 04/23/2019 04/18/2019  Transportation Screening Complete -  PCP or Specialist Appt within 3-5 Days Complete -  HRI or [place] Complete Complete  Social Work Consult for [place] Planning/Counseling Complete -  Palliative Care Screening Complete -  Medication Review Press photographer) Complete Complete  Some recent data might be hidden

## 2019-04-24 NOTE — Consult Note (Signed)
Consultation Note Date: 04/24/2019   Patient Name: Glen Davis  DOB: [birth year]  MRN: [id number]  Age / Sex: 78 y.o., male   PCP: Dion Body, MD Referring Physician: Nicholes Mango, MD   REASON FOR CONSULTATION:Establishing goals of care  Palliative Care consult requested for this 78 y.o. male with multiple medical problems including metastatic kidney cancer with lung/ bone metastases status post chemotherapy and immunotherapy, anemia, GI bleed with gastric polypectomy, coronary artery disease, COPD, hypertension, and melanoma. He presented a day after being discharged for upper GI bleed with complaints of confusion and melena. Since admission he continues to have hypotension and melena. He has received multiple blood transfusions.  Clinical Assessment and Goals of Care: I have reviewed medical records including lab results, imaging, Epic notes, and MAR, received report from the bedside RN, and assessed the patient. I met at the bedside with patient to discuss diagnosis prognosis, GOC, EOL wishes, disposition and options. He is awake, alert, and oriented x4. He denies pain or shortness of breath.   I re-introduced Palliative Medicine as specialized medical care for people living with serious illness. It focuses on providing relief from the symptoms and stress of a serious illness. The goal is to improve quality of life for both the patient and the family. Patient shared he was previously a patient of [facility] under their palliative services.   We discussed a brief life review of the patient, along with his functional and nutritional status. He reports he and his wife have been married for more than 30 years. They have 2 children and 2 grandchildren. He has 2 dogs also. 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 He is of [place] faith. He enjoys spending time with family and motorcycles. He states he has a Markus Daft which is his prize possession.   Prior to admission he  reports he was ambulatory with walker at times. He endorses increased fatigue, weakness, and shortness of breath. He has home oxygen which he states he uses as needed (2L). He was independent of ADLs with occasional assistance needed. He states his appetite was generally good, however some decline over the past few weeks. His wife and daughter does most of the errands and meal preparation.   We discussed His current illness and what it means in the larger context of His on-going co-morbidities. With specific discussions regarding his GI bleeding, shortness of breath, metastatic cancer, and overall functional state. Natural disease trajectory and expectations at EOL were discussed. Patient reports he is aware and understands his current illness and poor prognosis.   He shares that he has fought with his health a long-time and now his main focus is to get home with his family and spend what time he has left enjoying each moment and memory.   I attempted to elicit values and goals of care important to the patient.    The difference between aggressive medical intervention and comfort care was considered in light of the patient's goals of care. I educated patient on what comfort care measures would look like. I discussed with comfort care, patient would no longer receive aggressive medical interventions such as continuous vital signs, lab work, radiology testing, or medications not focused on comfort. All care will focus on how the patient is looking and feeling. This will include management of any symptoms that may cause discomfort, pain, shortness of breath, cough, nausea, agitation, anxiety, and/or secretions etc. Symptoms can be managed with medications and other non-pharmacological  interventions such as spiritual support, repositioning, music therapy, or therapeutic listening. Mr. aleksander edmiston understanding.   He shares that he does not want to suffer. He denies any pain or discomfort at this time.    He confirms wishes for DNR/DNI.   Hospice services outpatient were explained and offered. He understands they will be able to assist with keeping him comfortable in his home with his family. Patient verbalized his understanding and awareness hospice's goals and philosophy of care. He request for their support in the home for EOL care. Assessed for home equipment. He declines needs at this time. He is currently using home oxygen. He is aware that family may request home equipment such as hospital bed at anytime by communicating with outpatient hospice team.   Questions and concerns were addressed.  Hard Choices booklet left for review. Patient was encouraged to call with questions or concerns.  PMT will continue to support holistically.   SOCIAL HISTORY:     reports that he quit smoking about 50 years ago. His smoking use included cigarettes. He started smoking about 63 years ago. He has a 60.00 pack-year smoking history. He has never used smokeless tobacco. He reports previous alcohol use. He reports that he does not use drugs.  CODE STATUS: DNR  ADVANCE DIRECTIVES: NEXT OF KIN    SYMPTOM MANAGEMENT: PER ATTENDING  Palliative Prophylaxis:   Aspiration, Bowel Regimen and Frequent Pain Assessment  PSYCHO-SOCIAL/SPIRITUAL:  Support System: Family   Desire for further Chaplaincy support:Yes  Additional Recommendations (Limitations, Scope, Preferences):  No Artificial Feeding and Home with hospice for EOL support    PAST MEDICAL HISTORY: Past Medical History:  Diagnosis Date  . CAD (coronary artery disease)   . Cancer ([place])    left arm  . COPD (chronic obstructive pulmonary disease) ([place])   . Hypertension   . Kidney stone   . Lung cancer ([place])   . Melanoma ([place]) 01/24/2016   right shoulder  . Thyroid nodule 02/02/2016   left BENIGN THYROID NODULE by FNA    PAST SURGICAL HISTORY:  Past Surgical History:  Procedure Laterality Date  . arm surgery Left    arm  . cardiac  stents  2011   Angioplasty / Stenting Femoral-X2  . COLONOSCOPY  04/01/12  . CORONARY ANGIOPLASTY    . ESOPHAGOGASTRODUODENOSCOPY N/A 04/21/2019   Procedure: ESOPHAGOGASTRODUODENOSCOPY (EGD);  Surgeon: Lin Landsman, MD;  Location: [facility] ENDOSCOPY;  Service: Gastroenterology;  Laterality: N/A;  . EXCISION MELANOMA WITH SENTINEL LYMPH NODE BIOPSY Right 01/24/2016   Procedure: EXCISION MELANOMA Right Shoulder;  Surgeon: Robert Bellow, MD;  Location: [facility] ORS;  Service: General;  Laterality: Right;  . LAPAROSCOPIC NEPHRECTOMY, HAND ASSISTED Left 04/24/2016   Procedure: HAND ASSISTED LAPAROSCOPIC NEPHRECTOMY;  Surgeon: Hollice Espy, MD;  Location: [facility] ORS;  Service: Urology;  Laterality: Left;  . TIBIA IM NAIL INSERTION Left 02/11/2019   Procedure: INTRAMEDULLARY (IM) NAIL TIBIAL;  Surgeon: Lovell Sheehan, MD;  Location: [facility] ORS;  Service: Orthopedics;  Laterality: Left;  Marland Kitchen VIDEO ASSISTED THORACOSCOPY (VATS)/THOROCOTOMY Left 03/08/2016   Procedure: VIDEO ASSISTED THORACOSCOPY (VATS) with lung biopsy - Left ;  Surgeon: Robert Bellow, MD;  Location: [facility] ORS;  Service: General;  Laterality: Left;    ALLERGIES:  is allergic to lisinopril and other.   MEDICATIONS:  Current Facility-Administered Medications  Medication Dose Route Frequency Provider Last Rate Last Dose  . 0.9 %  sodium chloride infusion (Manually program via Guardrails IV Fluids)   Intravenous Once Gouru,  Illene Silver, MD   Stopped at 04/23/19 1740  . acetaminophen (TYLENOL) tablet 650 mg  650 mg Oral Q6H PRN Hillary Bow, MD       Or  . acetaminophen (TYLENOL) suppository 650 mg  650 mg Rectal Q6H PRN Sudini, Srikar, MD      . albuterol (PROVENTIL) (2.5 MG/3ML) 0.083% nebulizer solution 2.5 mg  2.5 mg Nebulization Q2H PRN Sudini, Srikar, MD      . atorvastatin (LIPITOR) tablet 40 mg  40 mg Oral Daily Sudini, Srikar, MD   40 mg at 04/23/19 1724  . busPIRone (BUSPAR) tablet 5 mg  5 mg Oral BID Hillary Bow, MD   5 mg at  04/23/19 2223  . docusate sodium (COLACE) capsule 100 mg  100 mg Oral BID Hillary Bow, MD   100 mg at 04/23/19 2223  . fluticasone (FLONASE) 50 MCG/ACT nasal spray 2 spray  2 spray Each Nare Daily Hillary Bow, MD   2 spray at 04/23/19 [phone]  . lactated ringers infusion   Intravenous Continuous Hillary Bow, MD 75 mL/hr at 04/24/19 0411    . levothyroxine (SYNTHROID) tablet 75 mcg  75 mcg Oral QAC breakfast Hillary Bow, MD   75 mcg at 04/23/19 0719  . loratadine (CLARITIN) tablet 10 mg  10 mg Oral Daily Hillary Bow, MD   10 mg at 04/23/19 5681  . Melatonin TABS 10 mg  10 mg Oral QHS Hillary Bow, MD   10 mg at 04/23/19 2223  . montelukast (SINGULAIR) tablet 10 mg  10 mg Oral QHS Hillary Bow, MD   10 mg at 04/23/19 2223  . nitroGLYCERIN (NITROSTAT) SL tablet 0.4 mg  0.4 mg Sublingual Q5 min PRN Sudini, Alveta Heimlich, MD      . ondansetron (ZOFRAN) tablet 4 mg  4 mg Oral Q6H PRN Hillary Bow, MD       Or  . ondansetron (ZOFRAN) injection 4 mg  4 mg Intravenous Q6H PRN Sudini, Alveta Heimlich, MD      . oxyCODONE (Oxy IR/ROXICODONE) immediate release tablet 10 mg  10 mg Oral Q4H PRN Sudini, Srikar, MD      . pantoprazole (PROTONIX) EC tablet 40 mg  40 mg Oral BID AC Sudini, Srikar, MD   40 mg at 04/23/19 1724  . polyethylene glycol (MIRALAX / GLYCOLAX) packet 17 g  17 g Oral Daily PRN Sudini, Alveta Heimlich, MD      . tamsulosin (FLOMAX) capsule 0.4 mg  0.4 mg Oral Teresa Coombs, Srikar, MD   0.4 mg at 04/24/19 2751  . temazepam (RESTORIL) capsule 7.5 mg  7.5 mg Oral QHS PRN Sudini, Alveta Heimlich, MD      . tiotropium [facility]) inhalation capsule ([facility] use ONLY) 18 mcg  1 capsule Inhalation Daily Hillary Bow, MD   18 mcg at 04/23/19 [phone]  . tiZANidine (ZANAFLEX) tablet 4 mg  4 mg Oral Daily Hillary Bow, MD   4 mg at 04/23/19 7494   Facility-Administered Medications Ordered in Other Encounters  Medication Dose Route Frequency Provider Last Rate Last Dose  . 0.9 %  sodium chloride infusion   Intravenous  Continuous Cammie Sickle, MD   Stopped at 04/21/19 4967    VITAL SIGNS: BP 120/75   Pulse 61   Temp 98.3 F (36.8 C)   Resp 20   Ht _0  (1.854 m)   Wt 78.6 kg   SpO2 97%   BMI 22.86 kg/m  Filed Weights   04/23/19 0100  Weight: 78.6 kg    Estimated  body mass index is 22.86 kg/m as calculated from the following:   Height as of this encounter: _0  (1.854 m).   Weight as of this encounter: 78.6 kg.  LABS: CBC:    Component Value Date/Time   WBC 6.3 04/24/2019 0614   HGB 7.9 (L) 04/24/2019 0614   HGB 13.9 05/25/2016 1514   HCT 24.9 (L) 04/24/2019 0614   HCT 38.7 05/25/2016 1514   PLT 138 (L) 04/24/2019 0614   PLT 177 05/25/2016 1514   Comprehensive Metabolic Panel:    Component Value Date/Time   NA 139 04/24/2019 0614   NA 146 (H) 05/25/2016 1514   K 3.6 04/24/2019 0614   CO2 22 04/24/2019 0614   BUN 9 04/24/2019 0614   BUN 19 05/25/2016 1514   CREATININE 0.92 04/24/2019 0614   ALBUMIN 2.9 (L) 04/24/2019 2099     Review of Systems  All other systems reviewed and are negative.  Unless otherwise noted, a complete review of systems is negative.  Physical Exam General: NAD, chronically ill appearing, thin Cardiovascular: regular rate and rhythm Pulmonary: clear ant fields, diminished in bases Abdomen: soft, nontender, + bowel sounds Extremities: no edema, no joint deformities Skin: no rashes, scattered bruising  Neurological: Weakness alert and oriented x4  Prognosis: < 3 months in the setting of metastatic renal cancer with lung and bone mets, GI bleed, anemia, and hypotension.   Discharge Planning:  Home with Hospice  Recommendations:  DNR/DNI-as confirmed by patient (completed form on chart)  Continue to treat with goal of discharging home with hospice for EOL care.  No aggressive interventions or escalation in care.   Outpatient hospice in the home (referral placed) Denies need for home equipment (home oxygen currently)  PMT will  continue to support and follow as needed   Palliative Performance Scale: PPS 30%      Patient expressed understanding and was in agreement with this plan.   Thank you for allowing the Palliative Medicine Team to assist in the care of this patient.  Time In: 0915 Time Out: 1030 Time Total: 75 min.   Visit consisted of counseling and education dealing with the complex and emotionally intense issues of symptom management and palliative care in the setting of serious and potentially life-threatening illness.Greater than 50%  of this time was spent counseling and coordinating care related to the above assessment and plan.  Signed by:  Alda Lea, AGPCNP-BC Palliative Medicine Team  Phone: [phone] Fax: [phone] Pager: [phone] Amion: Bjorn Pippin

## 2019-04-24 NOTE — Telephone Encounter (Signed)
Patient's son Gene called and wants to discuss his dad's goals of care with Dr. B.   Please call [phone]

## 2019-04-24 NOTE — Telephone Encounter (Signed)
Order called to [employer]

## 2019-04-24 NOTE — Discharge Summary (Signed)
[facility] NAME: Pankaj Haack    MR#:  [id number]  DATE OF BIRTH:  08-30-1941  DATE OF ADMISSION:  04/22/2019 ADMITTING PHYSICIAN: Hillary Bow, MD  DATE OF DISCHARGE:  04/24/19  PRIMARY CARE PHYSICIAN: Dion Body, MD    ADMISSION DIAGNOSIS:  Black stool [K92.1] Hypotension, unspecified hypotension type [I95.9] Altered mental status, unspecified altered mental status type [R41.82]  DISCHARGE DIAGNOSIS:  Active Problems:   Cancer of left kidney excluding renal pelvis (HCC)   Hypotension FTT  SECONDARY DIAGNOSIS:   Past Medical History:  Diagnosis Date  . CAD (coronary artery disease)   . Cancer ([place])    left arm  . COPD (chronic obstructive pulmonary disease) ([place])   . Hypertension   . Kidney stone   . Lung cancer ([place])   . Melanoma ([place]) 01/24/2016   right shoulder  . Thyroid nodule 02/02/2016   left BENIGN THYROID NODULE by FNA    HOSPITAL COURSE:   hpi  Glen Davis  is a 78 y.o. male with a known history of hypertension, metastatic renal cell carcinoma, recent upper GI bleed with gastric polypectomy, anemia who was discharged yesterday from the hospital presents again from chondral clinic due to confusion, being pale and melena.  Patient at this time feels weak and sleeps easily but is alert and oriented.  Able to give history.  Blood pressure initially was systolic 61W and after 1 L bolus has come up into the 90s.  Hemoglobin is stable from the time of discharge. No abdominal pain or vomiting.  Poor appetite.  Patient had some confusion during the hospital stay that was improved by time of discharge.  Thought to be due to being in the hospital.  A CT scan of the head during recent admission 3 days back showed nothing acute.  Hospital cousre  *Symptomatic anemia secondary to recent GI bleed.  Status post endoscopy Hemoglobin at 6.9.  1 unit of PRBC given and a repeat hemoglobin at 7.9no active  bleeding . Continue Protonix twice daily. Discontinue  aspirin and Plavix.  GI consult.  Seen by Dr. Marius Ditch in the past  ,d/w her metastatic lesions in the stomach per pathology, recommended palliative care    *Hypotension. . Blood pressure has responded to IV fluids and  Not hypotensive , not on BP meds   *Acute encephalopathy -patient is awake and alert and mentating fine today. Wants to go home with hospice care  Recent CT scan of the head reviewed and showed nothing acute. No focal weakness or numbness found. . Continue oxycodone as needed.  Fentanyl patch  Resumed   *Metastatic renal cell carcinoma. Follows with Dr. Bradd Canary as outpatient.  Appreciate Dr. Rogue Bussing recommendations, he has d/w pt at length and pt underestood his poor prognosis state, he is seen by palliative care , agreeable with home with hospice care   *Hypothyroidism. Continue Synthroid  DVT prophylaxis with SCDs.   Prognosis is poor from the renal cell carcinoma with mets standpoint DISCHARGE CONDITIONS:   FAIR  CONSULTS OBTAINED:     PROCEDURES NONE   DRUG ALLERGIES:   Allergies  Allergen Reactions  . Lisinopril Swelling  . Other Itching    Nitroglycerin Patch.    DISCHARGE MEDICATIONS:   Allergies as of 04/24/2019      Reactions   Lisinopril Swelling   Other Itching   Nitroglycerin Patch.      Medication List    STOP taking these medications  aspirin EC 81 MG tablet   clopidogrel 75 MG tablet Commonly known as:  PLAVIX     TAKE these medications   albuterol 108 (90 Base) MCG/ACT inhaler Commonly known as:  VENTOLIN HFA Inhale 2 puffs into the lungs every 6 (six) hours as needed for wheezing or shortness of breath.   atorvastatin 40 MG tablet Commonly known as:  LIPITOR Take 1 tablet (40 mg total) by mouth daily.   BC HEADACHE POWDER PO Take 1 packet by mouth daily as needed (headache).   busPIRone 5 MG tablet Commonly known as:  BUSPAR Take 5 mg by mouth 2  (two) times daily.   cholecalciferol 10 MCG (400 UNIT) Tabs tablet Commonly known as:  VITAMIN D3 Take 400 Units by mouth daily.   [employer] Citrate of Magnesia Soln Generic drug:  magnesium citrate Take 15 mLs by mouth as needed for constipation.   DULoxetine 30 MG capsule Commonly known as:  CYMBALTA Take 60 mg by mouth daily.   fentaNYL 75 MCG/HR Commonly known as:  Pioneer 1 patch onto the skin every 3 (three) days.   Fish Oil 1000 MG Cpdr Take 1,000 mg by mouth daily.   fluticasone 50 MCG/ACT nasal spray Commonly known as:  Flonase Place 2 sprays into both nostrils daily.   Glucosamine Chond Complex/MSM Tabs Take 2 tablets by mouth daily.   ipratropium-albuterol 0.5-2.5 (3) MG/3ML Soln Commonly known as:  DUONEB Take 3 mLs by nebulization every 4 (four) hours as needed.   levothyroxine 75 MCG tablet Commonly known as:  Synthroid Take 1 tablet (75 mcg total) by mouth daily before breakfast.   loperamide 1 MG/5ML solution Commonly known as:  IMODIUM Take 4 mg by mouth as needed for diarrhea or loose stools.   loratadine 10 MG tablet Commonly known as:  CLARITIN Take 10 mg by mouth daily.   MELATONIN PO Take 10 mg by mouth at bedtime.   metFORMIN 500 MG tablet Commonly known as:  GLUCOPHAGE TAKE 1 TABLET (500 MG TOTAL) BY MOUTH 2 (TWO) TIMES DAILY WITH A MEAL.   montelukast 10 MG tablet Commonly known as:  SINGULAIR TAKE 1 TABLET BY MOUTH EVERYDAY AT BEDTIME What changed:  See the new instructions.   nitroGLYCERIN 0.4 MG SL tablet Commonly known as:  NITROSTAT Place 1 tablet (0.4 mg total) under the tongue every 5 (five) minutes as needed.   Oxycodone HCl 10 MG Tabs Take 1-2 tablets (10-20 mg total) by mouth every 4 (four) hours as needed for breakthrough pain.   pantoprazole 40 MG tablet Commonly known as:  PROTONIX Take 1 tablet (40 mg total) by mouth 2 (two) times daily before a meal.   polyethylene glycol 17 g packet Commonly known as:   MIRALAX / GLYCOLAX Take 17 g by mouth daily as needed for mild constipation.   tamsulosin 0.4 MG Caps capsule Commonly known as:  FLOMAX TAKE 1 CAPSULE (0.4 MG TOTAL) BY MOUTH EVERY MORNING.   temazepam 7.5 MG capsule Commonly known as:  RESTORIL Take 1 capsule (7.5 mg total) by mouth at bedtime as needed for sleep.   Tiotropium Bromide Monohydrate 1.25 MCG/ACT Aers Commonly known as:  Spiriva Respimat Inhale 1.25 Act into the lungs 2 (two) times daily.   tiZANidine 4 MG tablet Commonly known as:  ZANAFLEX Take 4 mg by mouth daily.   Turmeric 500 MG Tabs Take 500 mg by mouth daily.        DISCHARGE INSTRUCTIONS:   F/u with palliative care  and pcp prn  DIET:  Regular diet  DISCHARGE CONDITION:  Fair  ACTIVITY:  Activity as tolerated  OXYGEN:  Home Oxygen: Yes.     Oxygen Delivery: 2  liters/min via Patient connected to nasal cannula oxygen  DISCHARGE LOCATION:  home   If you experience worsening of your admission symptoms, develop shortness of breath, life threatening emergency, suicidal or homicidal thoughts you must seek medical attention immediately by calling 911 or calling your MD immediately  if symptoms less severe.  You Must read complete instructions/literature along with all the possible adverse reactions/side effects for all the Medicines you take and that have been prescribed to you. Take any new Medicines after you have completely understood and accpet all the possible adverse reactions/side effects.   Please note  You were cared for by a hospitalist during your hospital stay. If you have any questions about your discharge medications or the care you received while you were in the hospital after you are discharged, you can call the unit and asked to speak with the hospitalist on call if the hospitalist that took care of you is not available. Once you are discharged, your primary care physician will handle any further medical issues. Please note that NO  REFILLS for any discharge medications will be authorized once you are discharged, as it is imperative that you return to your primary care physician (or establish a relationship with a primary care physician if you do not have one) for your aftercare needs so that they can reassess your need for medications and monitor your lab values.     Today  Chief Complaint  Patient presents with  . Altered Mental Status   Pt is doing fine , no complaints, no bleeding , wants to go home   ROS:  CONSTITUTIONAL: Denies fevers, chills. Denies any fatigue, weakness.  EYES: Denies blurry vision, double vision, eye pain. EARS, NOSE, THROAT: Denies tinnitus, ear pain, hearing loss. RESPIRATORY: Denies cough, wheeze, shortness of breath.  CARDIOVASCULAR: Denies chest pain, palpitations, edema.  GASTROINTESTINAL: Denies nausea, vomiting, diarrhea, abdominal pain. Denies bright red blood per rectum. GENITOURINARY: Denies dysuria, hematuria. ENDOCRINE: Denies nocturia or thyroid problems. HEMATOLOGIC AND LYMPHATIC: Denies easy bruising or bleeding. SKIN: Denies rash or lesion. MUSCULOSKELETAL: Denies pain in neck, back, shoulder, knees, hips or arthritic symptoms.  NEUROLOGIC: Denies paralysis, paresthesias.  PSYCHIATRIC: Denies anxiety or depressive symptoms.   VITAL SIGNS:  Blood pressure 120/75, pulse 61, temperature 98.3 F (36.8 C), resp. rate 20, height 6\' 1"  (1.854 m), weight 78.6 kg, SpO2 97 %.  I/O:    Intake/Output Summary (Last 24 hours) at 04/24/2019 1057 Last data filed at 04/24/2019 0956 Gross per 24 hour  Intake 2757.01 ml  Output 1000 ml  Net 1757.01 ml    PHYSICAL EXAMINATION:  GENERAL:  78 y.o.-year-old patient lying in the bed with no acute distress.  EYES: Pupils equal, round, reactive to light and accommodation. No scleral icterus. Extraocular muscles intact.  HEENT: Head atraumatic, normocephalic. Oropharynx and nasopharynx clear.  NECK:  Supple, no jugular venous  distention. No thyroid enlargement, no tenderness.  LUNGS: Normal breath sounds bilaterally, no wheezing, rales,rhonchi or crepitation. No use of accessory muscles of respiration.  CARDIOVASCULAR: S1, S2 normal. No murmurs, rubs, or gallops.  ABDOMEN: Soft, non-tender, non-distended. Bowel sounds present.   EXTREMITIES: No pedal edema, cyanosis, or clubbing.  NEUROLOGIC: Cranial nerves II through XII are intact. Muscle strength 5/5 in all extremities. Sensation intact. Gait not checked.  PSYCHIATRIC: The patient  is alert and oriented x 3.  SKIN: No obvious rash, lesion, or ulcer.   DATA REVIEW:   CBC Recent Labs  Lab 04/24/19 0614  WBC 6.3  HGB 7.9*  HCT 24.9*  PLT 138*    Chemistries  Recent Labs  Lab 04/24/19 0614  NA 139  K 3.6  CL 110  CO2 22  GLUCOSE 102*  BUN 9  CREATININE 0.92  CALCIUM 7.7*  AST 16  ALT 11  ALKPHOS 62  BILITOT 0.9    Cardiac Enzymes Recent Labs  Lab 04/22/19 1146  [place] <0.03    Microbiology Results  Results for orders placed or performed during the hospital encounter of 04/22/19  SARS Coronavirus 2 (CEPHEID - Performed in [facility] hospital lab), Hosp Order     Status: None   Collection Time: 04/22/19  2:13 PM  Result Value Ref Range Status   SARS Coronavirus 2 NEGATIVE NEGATIVE Final    Comment: (NOTE) If result is NEGATIVE SARS-CoV-2 target nucleic acids are NOT DETECTED. The SARS-CoV-2 RNA is generally detectable in upper and lower  respiratory specimens during the acute phase of infection. The lowest  concentration of SARS-CoV-2 viral copies this assay can detect is 250  copies / mL. A negative result does not preclude SARS-CoV-2 infection  and should not be used as the sole basis for treatment or other  patient management decisions.  A negative result may occur with  improper specimen collection / handling, submission of specimen other  than nasopharyngeal swab, presence of viral mutation(s) within the  areas targeted  by this assay, and inadequate number of viral copies  (<250 copies / mL). A negative result must be combined with clinical  observations, patient history, and epidemiological information. If result is POSITIVE SARS-CoV-2 target nucleic acids are DETECTED. The SARS-CoV-2 RNA is generally detectable in upper and lower  respiratory specimens dur ing the acute phase of infection.  Positive  results are indicative of active infection with SARS-CoV-2.  Clinical  correlation with patient history and other diagnostic information is  necessary to determine patient infection status.  Positive results do  not rule out bacterial infection or co-infection with other viruses. If result is PRESUMPTIVE POSTIVE SARS-CoV-2 nucleic acids MAY BE PRESENT.   A presumptive positive result was obtained on the submitted specimen  and confirmed on repeat testing.  While 2019 novel coronavirus  (SARS-CoV-2) nucleic acids may be present in the submitted sample  additional confirmatory testing may be necessary for epidemiological  and / or clinical management purposes  to differentiate between  SARS-CoV-2 and other Sarbecovirus currently known to infect humans.  If clinically indicated additional testing with an alternate test  methodology [phone]) is advised. The SARS-CoV-2 RNA is generally  detectable in upper and lower respiratory sp ecimens during the acute  phase of infection. The expected result is Negative. Fact Sheet for Patients:  StrictlyIdeas.no Fact Sheet for Healthcare Providers: BankingDealers.co.za This test is not yet approved or cleared by the [place] FDA and has been authorized for detection and/or diagnosis of SARS-CoV-2 by FDA under an Emergency Use Authorization (EUA).  This EUA will remain in effect (meaning this test can be used) for the duration of the COVID-19 declaration under Section 564(b)(1) of the Act, 21 U.S.C. section  360bbb-3(b)(1), unless the authorization is terminated or revoked sooner. Performed at [facility], [address] [place], [place] [address]     RADIOLOGY:  No results found.  EKG:   Orders placed or  performed during the hospital encounter of 04/22/19  . ED EKG  . ED EKG      Management plans discussed with the patient, he is  in agreement.  CODE STATUS:     Code Status Orders  (From admission, onward)         Start     Ordered   04/22/19 1602  Do not attempt resuscitation (DNR)  Continuous    Question Answer Comment  In the event of cardiac or respiratory ARREST Do not call a "code blue"   In the event of cardiac or respiratory ARREST Do not perform Intubation, CPR, defibrillation or ACLS   In the event of cardiac or respiratory ARREST Use medication by any route, position, wound care, and other measures to relive pain and suffering. May use oxygen, suction and manual treatment of airway obstruction as needed for comfort.      04/22/19 1601        Code Status History    Date Active Date Inactive Code Status Order ID Comments User Context   04/22/2019 1545 04/22/2019 1601 Full Code [id number]  Hillary Bow, MD ED   04/16/2019 2018 04/21/2019 1630 Full Code [id number]  Sela Hua, MD Inpatient   02/10/2019 1206 02/12/2019 1811 Full Code [id number]  Gorden Harms, MD Inpatient   04/22/2018 0836 04/23/2018 1437 DNR [id number]  Dustin Flock, MD Inpatient   04/21/2018 1437 04/22/2018 0836 Full Code [id number]  Henreitta Leber, MD Inpatient   03/08/2016 1329 03/10/2016 1421 Full Code [id number]  Robert Bellow, MD Inpatient    Advance Directive Documentation     Most Recent Value  Type of Advance Directive  Healthcare Power of Attorney, Living will  Pre-existing out of facility DNR order (yellow form or pink MOST form)  -  "MOST" Form in Place?  -      TOTAL TIME TAKING CARE OF THIS PATIENT: 43  minutes.   Note: This dictation was prepared with Dragon  dictation along with smaller phrase technology. Any transcriptional errors that result from this process are unintentional.   @[facility] @  on 04/24/2019 at 10:57 AM  Between 7am to 6pm - Pager - [phone]  After 6pm go to [email or web address] - password [email or web address] [facility]  Office  [phone]  CC: Primary care physician; Dion Body, MD

## 2019-04-24 NOTE — Telephone Encounter (Signed)
msg rcvd from Son Gene. Dr. B can also try calling pt's cell phone as he has his dad's cell if Dr. B can not reach him at [facility] personal cell #

## 2019-04-24 NOTE — Progress Notes (Signed)
Glen Davis   DOB:1941-10-21   WY#:[id number]    Subjective: Patient resting in the bed eating his breakfast.  Denies any abdominal pain nausea vomiting.  No blood in stools or black-colored stools as per patient   Patient had a PRBC transfusion last night.  Transfusion overnight uneventful.  Objective:  Vitals:   04/24/19 0530 04/24/19 0851  BP: 117/68 120/75  Pulse: 66 61  Resp: 16 20  Temp: 98 F (36.7 C) 98.3 F (36.8 C)  SpO2: 98% 97%     Intake/Output Summary (Last 24 hours) at 04/24/2019 2115 Last data filed at 04/24/2019 1403 Gross per 24 hour  Intake 980.31 ml  Output -  Net 980.31 ml    Physical Exam  Constitutional: He is oriented to person, place, and time and well-developed, well-nourished, and in no distress.  HENT:  Head: Normocephalic and atraumatic.  Mouth/Throat: Oropharynx is clear and moist. No oropharyngeal exudate.  Eyes: Pupils are equal, round, and reactive to light.  Neck: Normal range of motion. Neck supple.  Cardiovascular: Normal rate and regular rhythm.  Pulmonary/Chest: No respiratory distress. He has no wheezes.  Abdominal: Soft. Bowel sounds are normal. He exhibits no distension and no mass. There is no abdominal tenderness. There is no rebound and no guarding.  Musculoskeletal: Normal range of motion.        General: No tenderness or edema.  Neurological: He is alert and oriented to person, place, and time.  Skin: Skin is warm.  Psychiatric: Affect normal.     Labs:  Lab Results  Component Value Date   WBC 6.3 04/24/2019   HGB 7.9 (L) 04/24/2019   HCT 24.9 (L) 04/24/2019   MCV 92.2 04/24/2019   PLT 138 (L) 04/24/2019   NEUTROABS 6.7 04/22/2019    Lab Results  Component Value Date   NA 139 04/24/2019   K 3.6 04/24/2019   CL 110 04/24/2019   CO2 22 04/24/2019    Studies:  No results found.  78 year old male patient with a history of renal cell carcinoma currently admitted to hospital for mental status changes/severe  anemia.  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  Patient was recently on ipi+nivo.  Unfortunately no other viable treatment options available.  #Mental status changes-likely due to hypotension.  Back to baseline.  #Severe anemia-secondary GI bleed secondary to metastatic lesions in the stomach/hemoglobin 7.8 presentation status post transfusion 7.9.  No active bleeding noted.  #Prognosis: Extremely poor life expectancy few months; however patient with significant risk for catastrophic GI bleed.  I had a long discussion with the patient/son/wife in detail regarding the clinical status/recommendation of hospice.  After lengthy discussion it was decided that patient would not benefit from ongoing aggressive therapy.  Patient's family has a meeting with hospice tomorrow afternoon.  Recommend home discharge with hospice-as per family wishes.   # 40 minutes face-to-face with the patient discussing the above plan of care; more than 50% of time spent on prognosis/ natural history; counseling and coordination. Also discussed with Dr.Gouru.    Cammie Sickle, MD 04/24/2019  9:15 PM

## 2019-04-24 NOTE — Plan of Care (Signed)

## 2019-04-26 LAB — BPAM RBC
Blood Product Expiration Date: 202007012359
Blood Product Expiration Date: 202007012359
ISSUE DATE / TIME: 202006041330
Unit Type and Rh: 5100
Unit Type and Rh: 5100

## 2019-04-26 LAB — TYPE AND SCREEN
ABO/RH(D): O POS
Antibody Screen: NEGATIVE
Unit division: 0
Unit division: 0

## 2019-04-28 ENCOUNTER — Telehealth: Payer: Self-pay | Admitting: *Deleted

## 2019-04-28 NOTE — Telephone Encounter (Signed)
Glen Davis- that's ok with me. Thx

## 2019-04-28 NOTE — Telephone Encounter (Signed)
VO called to [facility]

## 2019-04-28 NOTE — Telephone Encounter (Signed)
Hospice requesting to change Restoril 7.5 mg to 15 mg because it is cheaper. Please advise

## 2019-05-04 ENCOUNTER — Other Ambulatory Visit: Payer: PPO

## 2019-05-04 ENCOUNTER — Ambulatory Visit: Payer: PPO

## 2019-05-04 ENCOUNTER — Ambulatory Visit: Payer: PPO | Admitting: Internal Medicine

## 2019-05-07 ENCOUNTER — Other Ambulatory Visit: Payer: Self-pay | Admitting: *Deleted

## 2019-05-07 MED ORDER — HALOPERIDOL 0.5 MG PO TABS: ORAL_TABLET | ORAL | 1 refills | Status: AC

## 2019-05-07 NOTE — Telephone Encounter (Signed)
Hospice nurse called asking for refill of his Haldol which was used form the comfort Kit. She reports he is very aggitated. Please refill this for patient

## 2019-05-13 ENCOUNTER — Other Ambulatory Visit: Payer: Self-pay | Admitting: *Deleted

## 2019-05-13 MED ORDER — MORPHINE SULFATE (CONCENTRATE) 20 MG/ML PO SOLN: 5.0000 mg | ORAL | 0 refills | Status: DC | PRN

## 2019-05-13 NOTE — Addendum Note (Signed)
Addended by: Betti Cruz on: 05/13/2019 10:41 AM   Modules accepted: Orders

## 2019-05-18 ENCOUNTER — Other Ambulatory Visit: Payer: Self-pay | Admitting: *Deleted

## 2019-05-18 MED ORDER — MORPHINE SULFATE (CONCENTRATE) 20 MG/ML PO SOLN: 15.0000 mg | ORAL | 0 refills | Status: DC | PRN

## 2019-05-20 DIAGNOSIS — R062 Wheezing: Secondary | ICD-10-CM | POA: Diagnosis not present

## 2019-05-20 DIAGNOSIS — R05 Cough: Secondary | ICD-10-CM | POA: Diagnosis not present

## 2019-05-20 DIAGNOSIS — C642 Malignant neoplasm of left kidney, except renal pelvis: Secondary | ICD-10-CM | POA: Diagnosis not present

## 2019-05-20 DIAGNOSIS — J449 Chronic obstructive pulmonary disease, unspecified: Secondary | ICD-10-CM | POA: Diagnosis not present

## 2019-05-25 ENCOUNTER — Encounter: Payer: Self-pay | Admitting: Gastroenterology

## 2019-06-01 ENCOUNTER — Other Ambulatory Visit: Payer: Self-pay | Admitting: *Deleted

## 2019-06-01 MED ORDER — MORPHINE SULFATE (CONCENTRATE) 20 MG/ML PO SOLN: 20.0000 mg | ORAL | 0 refills | Status: AC | PRN

## 2019-06-01 NOTE — Telephone Encounter (Signed)
PDMP Reviewed. I called and spoke with hospice nurse regarding the does of morphine. Patient reportedly is actively dying and hospice has had to titrate dose of morphine to control his symptoms (grimacing, moaning, etc). I suggested that we could initiate a morphine infusion but family declined.

## 2019-06-02 ENCOUNTER — Telehealth: Payer: Self-pay | Admitting: *Deleted

## 2019-06-20 NOTE — Telephone Encounter (Signed)
Deandra from hospice called to report that patient died at home at 10:25 this morning

## 2019-06-20 DEATH — deceased

## 2019-11-28 IMAGING — CR DG CHEST 2V
1 series · 2 of 2 positions shown · non-contrast
Comparison: CT chest 01/24/2018

CLINICAL DATA: Cough, shortness of breath, history lung cancer,
melanoma, chemotherapy, COPD, coronary artery disease

EXAM:
CHEST - 2 VIEW

[Series 1: dg chest 2 view · 0.14mm/px · 2 of 2 slices shown]
[im 1/2]
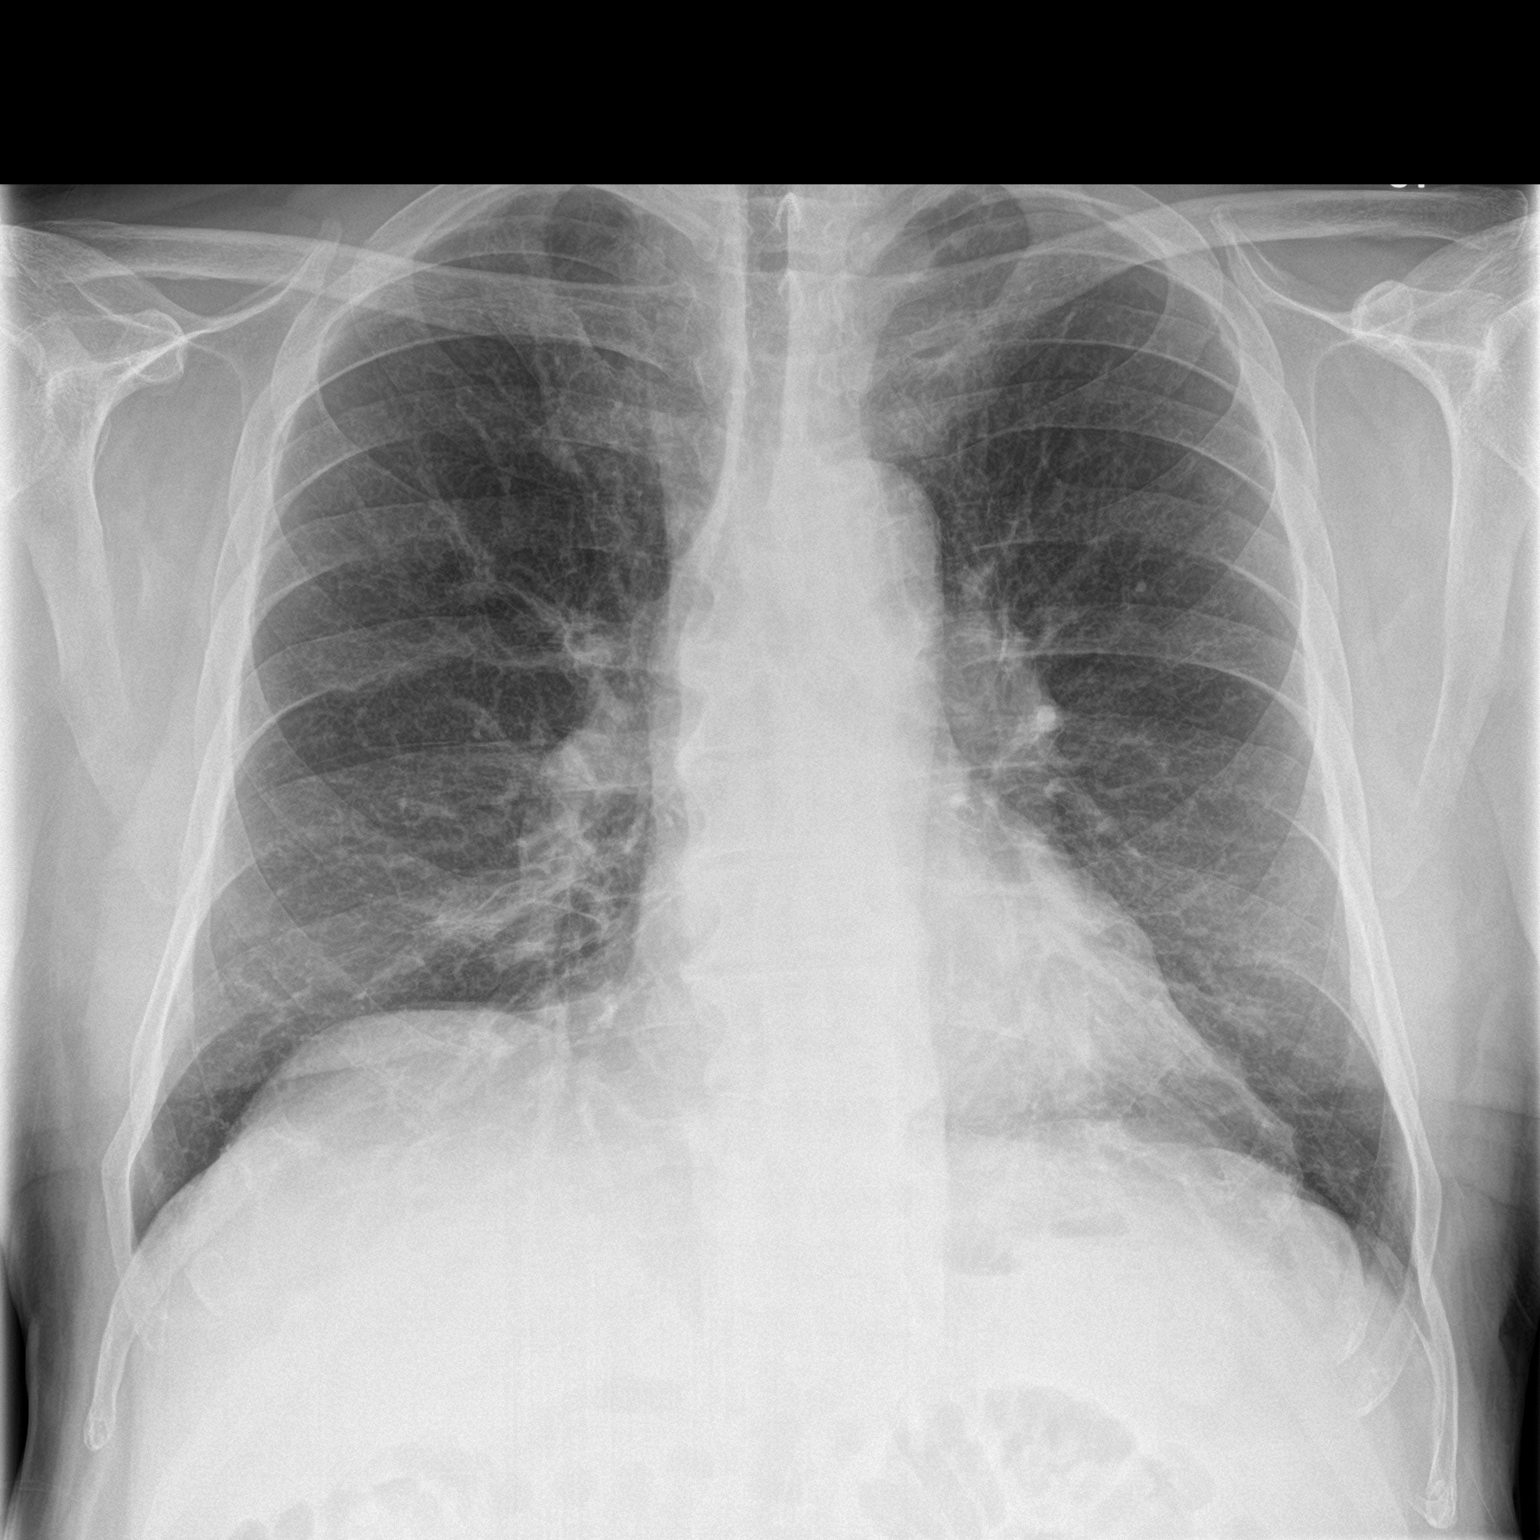
[im 2/2]
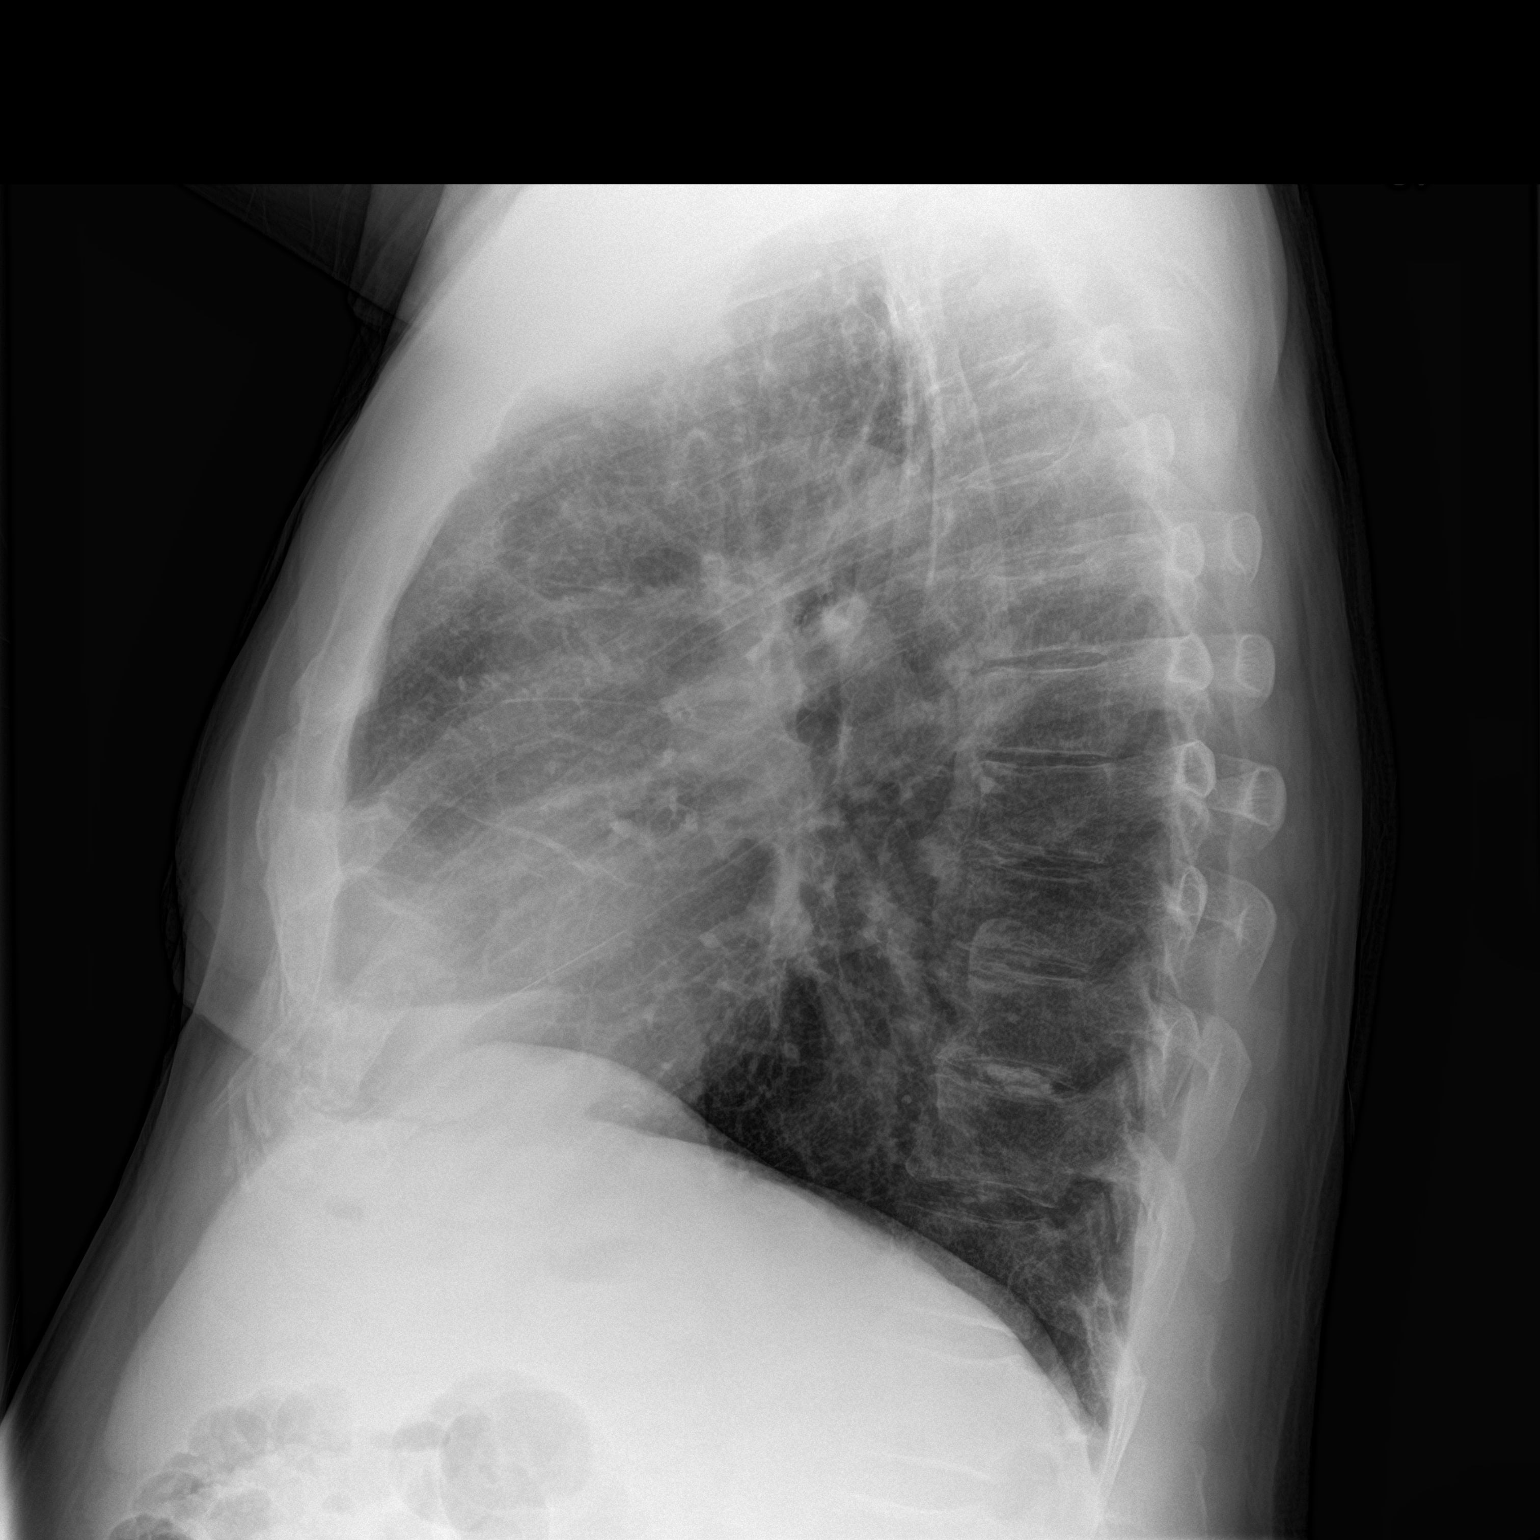

[2 of 2 positions shown; findings below may reference images not displayed]

FINDINGS: Normal heart size, mediastinal contours, and pulmonary vascularity.

Chronic enlargement of the LEFT pulmonary hilum, corresponding to
pulmonary arterial enlargement on most recent CT.

Focus of nodularity at the LEFT apex on the prior CT exam is likely
still present, suboptimally visualized due to superimposed osseous
structures.

Decreased prominence of a RIGHT upper lobe nodular density since
previous study now measuring approximately 11 mm diameter.

Peribronchial thickening with RIGHT basilar atelectasis.

No acute infiltrate, pleural effusion or pneumothorax.

Scattered degenerative disc disease changes thoracic spine.
IMPRESSION: Chronic bronchitic changes with RIGHT basilar atelectasis.

Decreased size of RIGHT upper lobe nodule since prior exam.

Probable persistence of LEFT upper lobe nodule.

## 2020-12-03 IMAGING — CT CT CHEST WITH CONTRAST
2 of 6 series · 12 of 36 positions shown, 15 images · IV contrast (omnipaque)
Comparison: 11/28/2018

CLINICAL DATA: Restaging metastatic left renal cell cancer status
post left nephrectomy with pulmonary and osseous metastases

EXAM:
CT CHEST, ABDOMEN, AND PELVIS WITH CONTRAST
TECHNIQUE: Multidetector CT imaging of the chest, abdomen and pelvis was
performed following the standard protocol during bolus
administration of intravenous contrast.
CONTRAST:  100mL OMNIPAQUE IOHEXOL 300 MG/ML  SOLN

[Series 4: coronals cap · coronal · 0.76mm/px · 3 of 146 slices shown]
[im 30/146  lung]
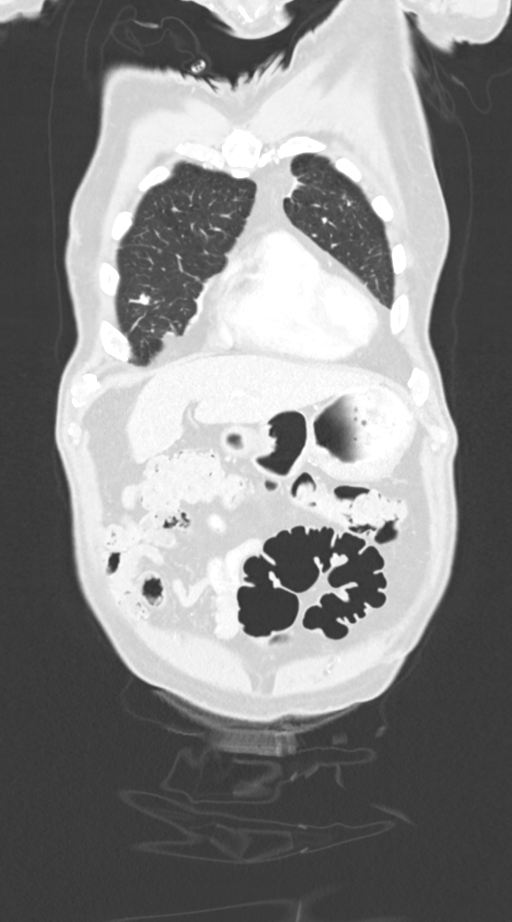
[im 59/146  lung]
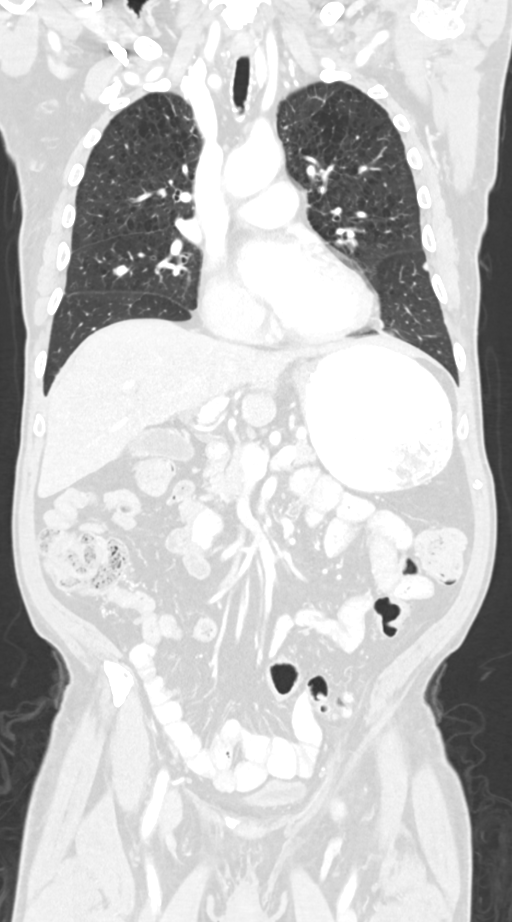
[im 88/146  lung]
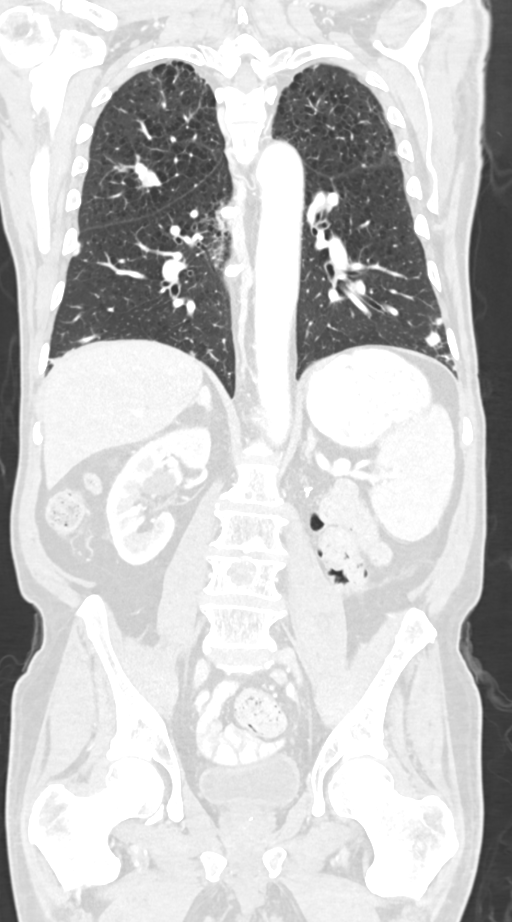

[Series 9: bone windows cap · axial · 0.76mm/px · z∈[-1586,-992]mm · 9 of 351 slices shown, 12 images]
[im 27/351  mediastinal]
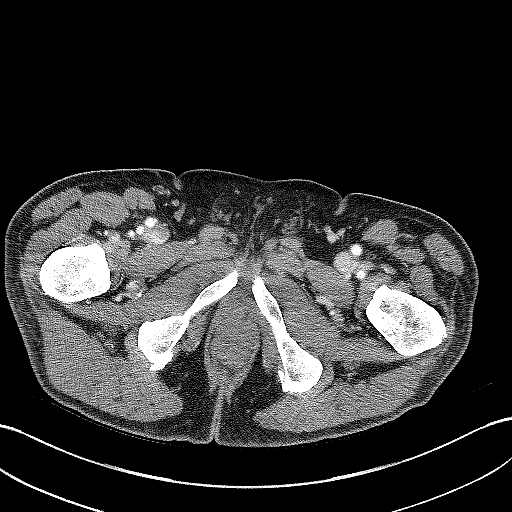
[im 27/351  lung]
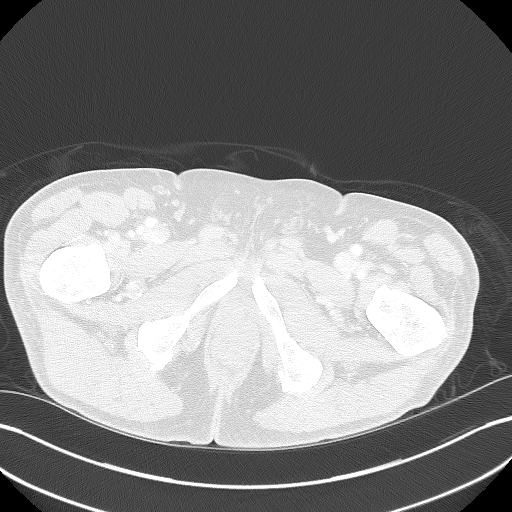
[im 81/351  lung]
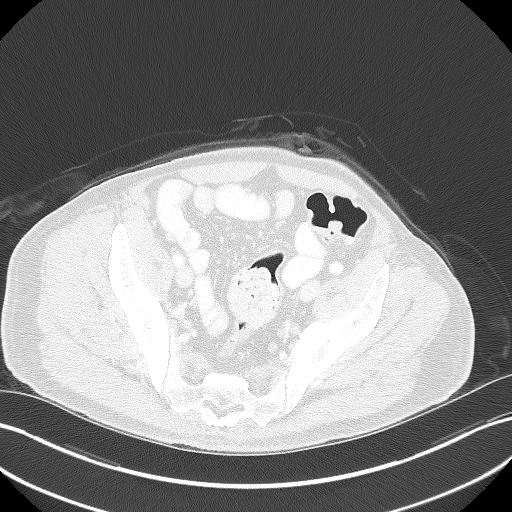
[im 108/351  lung]
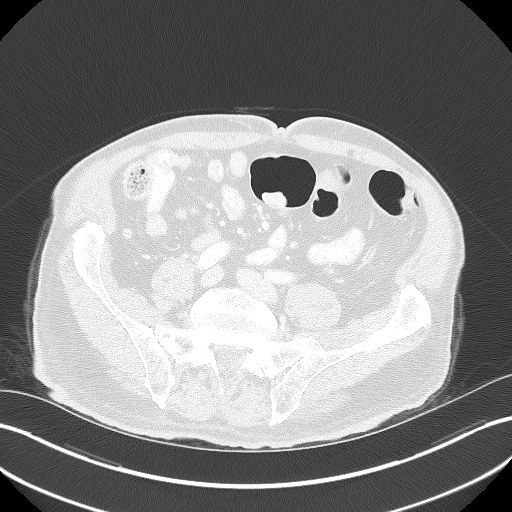
[im 135/351  lung]
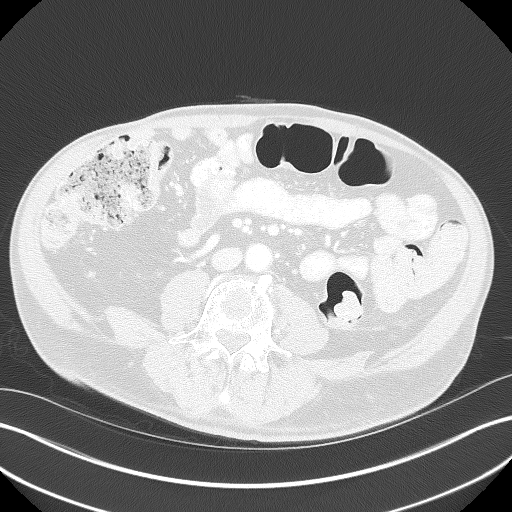
[im 189/351  mediastinal]
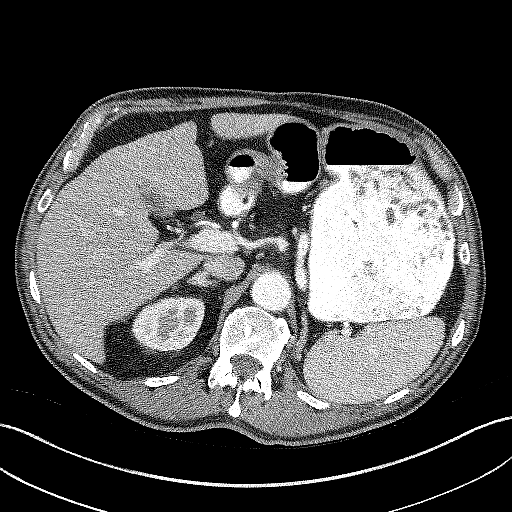
[im 189/351  lung]
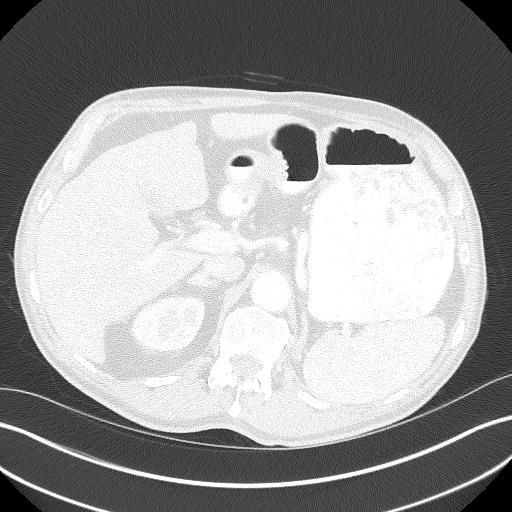
[im 216/351  lung]
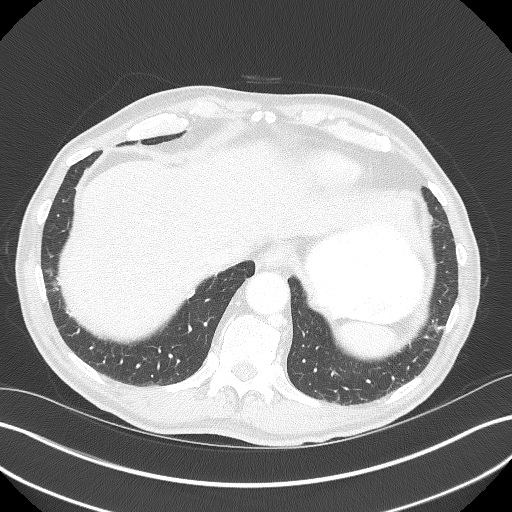
[im 243/351  lung]
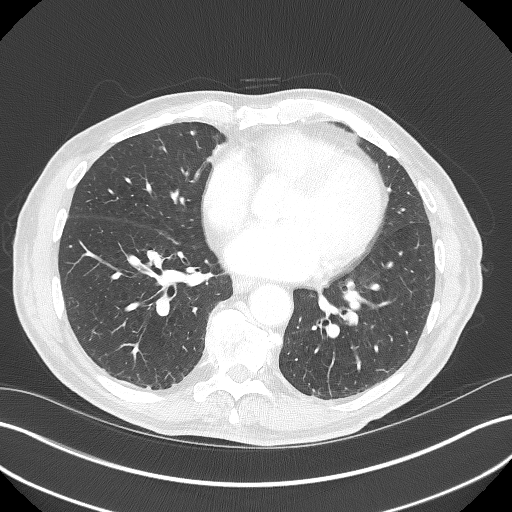
[im 297/351  lung]
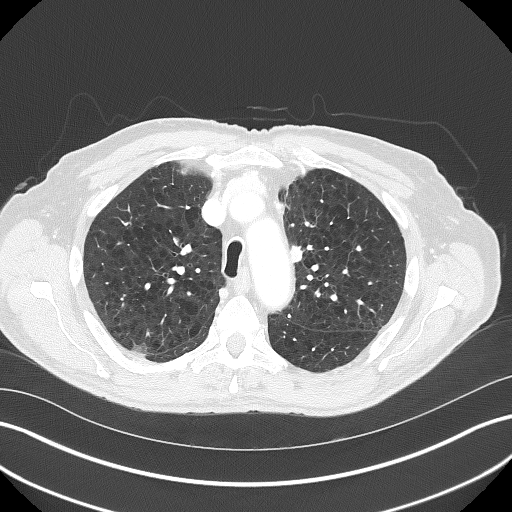
[im 324/351  mediastinal]
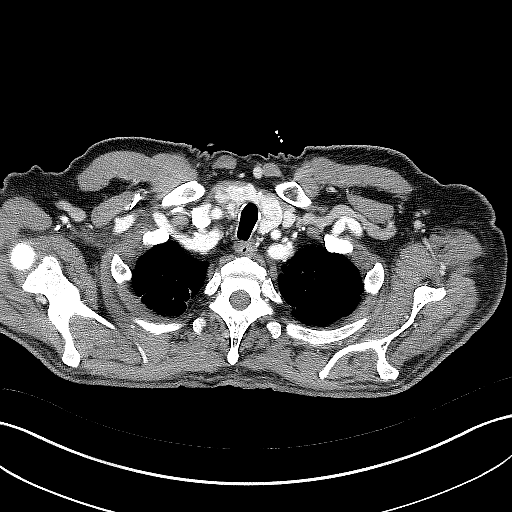
[im 324/351  lung]
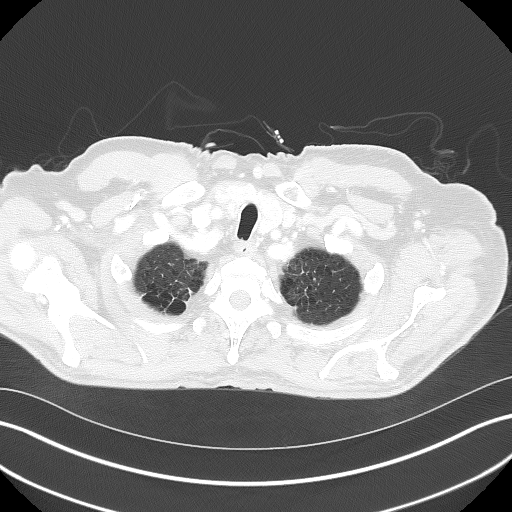

[12 of 36 positions shown; findings below may reference images not displayed]

FINDINGS: CT CHEST FINDINGS

Cardiovascular: Heart is normal in size.  No pericardial effusion.

No evidence of thoracic aortic aneurysm.

Coronary atherosclerosis of the LAD.

Mediastinum/Nodes: Small mediastinal lymph nodes which do not meet
pathologic CT size criteria.

Bilateral thyroid nodules, likely reflecting multinodular goiter.

Lungs/Pleura: Numerous bilateral pulmonary metastases. Overall, mild
progression is suspected. Index lesions include:

--1.5 x 1.9 mm nodule in the medial left upper lobe (series 3/image
39), unchanged

--2.1 x 2.2 cm nodule in the posterior right upper lobe (series
3/image 70), previously 1.7 x 1.8 cm

--2.4 x 2.1 cm nodule in the left lower lobe (series 2/image 122),
previously 2.1 x 2.2 cm

--1.8 x 2.8 cm nodule in the medial right lower lobe (series 3/image
132), previously 1.6 x 2.0 cm

Moderate centrilobular and paraseptal emphysematous changes. No
focal consolidation. No pleural effusion or pneumothorax.

Musculoskeletal: Multifocal lytic osseous metastases, including the
T8-10 vertebral bodies, grossly unchanged. Pathologic fracture
involving a lytic metastasis in the left scapula (series 9/image
13), incompletely visualized, grossly unchanged.

CT ABDOMEN PELVIS FINDINGS

Hepatobiliary: Liver is within normal limits.

Gallbladder is unremarkable. No intrahepatic or extrahepatic ductal
dilatation.

Pancreas: Within normal limits.

Spleen: Within normal limits.

Adrenals/Urinary Tract: 14 mm right adrenal metastasis (series
2/image 61), previously 11 mm. 16 mm left adrenal metastasis (series
2/image 70), previously 12 mm.

Status post left nephrectomy. No abnormal soft tissue in the
surgical bed.

2.8 cm anterior interpolar right renal cyst (series 2/image 34). No
hydronephrosis.

Bladder is within normal limits.

Stomach/Bowel: Stomach is within normal limits.

No evidence of bowel obstruction.

Normal appendix (series 2/image 96).

16 mm pericolonic nodule along the posterior aspect of the ascending
colon (series 2/image 81), previously 13 mm, suspicious for
enlarging peritoneal metastasis.

Vascular/Lymphatic: No evidence of abdominal aortic aneurysm.

Atherosclerotic calcifications of the abdominal aorta and branch
vessels.

No suspicious abdominopelvic lymphadenopathy.

Reproductive: Prostate is grossly unremarkable.

Other: No abdominopelvic ascites.

Musculoskeletal: Multifocal osseous metastases involving the lumbar
spine (L2-L4) and bilateral pelvis, including a dominant 4.6 cm
lesion in the left iliac bone (series 2/image 93), grossly
unchanged.
IMPRESSION: Status post left nephrectomy.

Mild progression of numerous bilateral pulmonary metastases, with
index lesions as above.

Bilateral adrenal metastases, mildly increased. Mild progression of
peritoneal metastasis along the posterior aspect of the ascending
colon.

Multifocal osseous metastases, grossly unchanged.
